# Patient Record
Sex: Female | Born: 1945 | Race: Black or African American | Hispanic: No | Smoking: Never smoker
Health system: Southern US, Community
[De-identification: ages and names within clinical notes are randomized; demographics above are authoritative.]

## PROBLEM LIST (undated history)

## (undated) DIAGNOSIS — Z972 Presence of dental prosthetic device (complete) (partial): Secondary | ICD-10-CM

## (undated) DIAGNOSIS — S42309A Unspecified fracture of shaft of humerus, unspecified arm, initial encounter for closed fracture: Secondary | ICD-10-CM

## (undated) DIAGNOSIS — E785 Hyperlipidemia, unspecified: Secondary | ICD-10-CM

## (undated) DIAGNOSIS — E039 Hypothyroidism, unspecified: Secondary | ICD-10-CM

## (undated) DIAGNOSIS — R42 Dizziness and giddiness: Secondary | ICD-10-CM

## (undated) DIAGNOSIS — I10 Essential (primary) hypertension: Secondary | ICD-10-CM

## (undated) DIAGNOSIS — E119 Type 2 diabetes mellitus without complications: Secondary | ICD-10-CM

## (undated) DIAGNOSIS — I509 Heart failure, unspecified: Secondary | ICD-10-CM

## (undated) HISTORY — PX: FOOT SURGERY: SHX648

---
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
## 2004-07-06 ENCOUNTER — Ambulatory Visit: Payer: Self-pay | Admitting: Family Medicine

## 2005-01-07 ENCOUNTER — Ambulatory Visit: Payer: Self-pay | Admitting: Family Medicine

## 2005-02-06 ENCOUNTER — Ambulatory Visit: Payer: Self-pay | Admitting: Family Medicine

## 2005-09-10 ENCOUNTER — Ambulatory Visit: Payer: Self-pay

## 2006-01-13 ENCOUNTER — Other Ambulatory Visit: Payer: Self-pay

## 2006-01-13 ENCOUNTER — Emergency Department: Payer: Self-pay | Admitting: Emergency Medicine

## 2006-06-26 ENCOUNTER — Ambulatory Visit: Payer: Self-pay | Admitting: Family Medicine

## 2006-07-08 ENCOUNTER — Ambulatory Visit: Payer: Self-pay | Admitting: Family Medicine

## 2006-08-07 ENCOUNTER — Ambulatory Visit: Payer: Self-pay | Admitting: Family Medicine

## 2006-09-17 ENCOUNTER — Ambulatory Visit: Payer: Self-pay

## 2006-11-28 ENCOUNTER — Ambulatory Visit: Payer: Self-pay | Admitting: Family Medicine

## 2007-01-14 ENCOUNTER — Ambulatory Visit: Payer: Self-pay | Admitting: Gastroenterology

## 2007-04-13 ENCOUNTER — Ambulatory Visit: Payer: Self-pay | Admitting: Nephrology

## 2007-08-25 ENCOUNTER — Ambulatory Visit: Payer: Self-pay | Admitting: Family Medicine

## 2007-12-08 ENCOUNTER — Ambulatory Visit: Payer: Self-pay

## 2008-04-24 ENCOUNTER — Emergency Department: Payer: Self-pay | Admitting: Emergency Medicine

## 2008-12-08 ENCOUNTER — Ambulatory Visit: Payer: Self-pay | Admitting: Family Medicine

## 2009-02-23 ENCOUNTER — Ambulatory Visit: Payer: Self-pay | Admitting: Family Medicine

## 2009-06-24 ENCOUNTER — Emergency Department: Payer: Self-pay | Admitting: Emergency Medicine

## 2009-10-28 ENCOUNTER — Emergency Department: Payer: Self-pay | Admitting: Emergency Medicine

## 2009-12-27 ENCOUNTER — Ambulatory Visit: Payer: Self-pay | Admitting: Family Medicine

## 2010-12-08 ENCOUNTER — Emergency Department: Payer: Self-pay | Admitting: Emergency Medicine

## 2011-02-20 ENCOUNTER — Ambulatory Visit: Payer: Self-pay | Admitting: Family Medicine

## 2011-04-13 ENCOUNTER — Emergency Department: Payer: Self-pay | Admitting: Emergency Medicine

## 2011-04-13 LAB — COMPREHENSIVE METABOLIC PANEL
Albumin: 3.9 g/dL (ref 3.4–5.0)
Alkaline Phosphatase: 117 U/L (ref 50–136)
Anion Gap: 12 (ref 7–16)
BUN: 31 mg/dL — ABNORMAL HIGH (ref 7–18)
Bilirubin,Total: 0.4 mg/dL (ref 0.2–1.0)
Calcium, Total: 9.8 mg/dL (ref 8.5–10.1)
Chloride: 101 mmol/L (ref 98–107)
Co2: 24 mmol/L (ref 21–32)
Creatinine: 1.9 mg/dL — ABNORMAL HIGH (ref 0.60–1.30)
EGFR (African American): 34 — ABNORMAL LOW
EGFR (Non-African Amer.): 28 — ABNORMAL LOW
Glucose: 393 mg/dL — ABNORMAL HIGH (ref 65–99)
Osmolality: 297 (ref 275–301)
Potassium: 4.4 mmol/L (ref 3.5–5.1)
SGOT(AST): 18 U/L (ref 15–37)
SGPT (ALT): 25 U/L
Sodium: 137 mmol/L (ref 136–145)
Total Protein: 8.8 g/dL — ABNORMAL HIGH (ref 6.4–8.2)

## 2011-04-13 LAB — CBC
HCT: 35.8 % (ref 35.0–47.0)
HGB: 11.9 g/dL — ABNORMAL LOW (ref 12.0–16.0)
MCH: 30.2 pg (ref 26.0–34.0)
MCHC: 33.2 g/dL (ref 32.0–36.0)
MCV: 91 fL (ref 80–100)
Platelet: 228 10*3/uL (ref 150–440)
RBC: 3.94 10*6/uL (ref 3.80–5.20)
RDW: 13.6 % (ref 11.5–14.5)
WBC: 8.8 10*3/uL (ref 3.6–11.0)

## 2011-04-13 LAB — CK TOTAL AND CKMB (NOT AT ARMC)
CK, Total: 73 U/L (ref 21–215)
CK-MB: 0.7 ng/mL (ref 0.5–3.6)

## 2011-04-13 LAB — TROPONIN I: Troponin-I: <0.02 ng/mL

## 2011-10-13 ENCOUNTER — Emergency Department: Payer: Self-pay | Admitting: Emergency Medicine

## 2012-02-25 ENCOUNTER — Ambulatory Visit: Payer: Self-pay | Admitting: Family Medicine

## 2012-05-13 ENCOUNTER — Emergency Department: Payer: Self-pay | Admitting: Internal Medicine

## 2012-05-13 LAB — URINALYSIS, COMPLETE
Bilirubin,UR: NEGATIVE
Blood: NEGATIVE
Glucose,UR: 50 mg/dL (ref 0–75)
Ketone: NEGATIVE
Nitrite: NEGATIVE
Ph: 5 (ref 4.5–8.0)
Protein: 100
RBC,UR: 1 /HPF (ref 0–5)
Specific Gravity: 1.013 (ref 1.003–1.030)
Squamous Epithelial: 2
WBC UR: 11 /HPF (ref 0–5)

## 2012-05-13 LAB — COMPREHENSIVE METABOLIC PANEL
Albumin: 3.5 g/dL (ref 3.4–5.0)
Alkaline Phosphatase: 122 U/L (ref 50–136)
Anion Gap: 8 (ref 7–16)
BUN: 19 mg/dL — ABNORMAL HIGH (ref 7–18)
Bilirubin,Total: 0.3 mg/dL (ref 0.2–1.0)
Calcium, Total: 8.9 mg/dL (ref 8.5–10.1)
Chloride: 107 mmol/L (ref 98–107)
Co2: 23 mmol/L (ref 21–32)
Creatinine: 1.4 mg/dL — ABNORMAL HIGH (ref 0.60–1.30)
EGFR (African American): 46 — ABNORMAL LOW
EGFR (Non-African Amer.): 39 — ABNORMAL LOW
Glucose: 196 mg/dL — ABNORMAL HIGH (ref 65–99)
Osmolality: 283 (ref 275–301)
Potassium: 4 mmol/L (ref 3.5–5.1)
SGOT(AST): 15 U/L (ref 15–37)
SGPT (ALT): 17 U/L (ref 12–78)
Sodium: 138 mmol/L (ref 136–145)
Total Protein: 8.2 g/dL (ref 6.4–8.2)

## 2012-05-13 LAB — CBC
HCT: 32.8 % — ABNORMAL LOW (ref 35.0–47.0)
HGB: 10.4 g/dL — ABNORMAL LOW (ref 12.0–16.0)
MCH: 28.7 pg (ref 26.0–34.0)
MCHC: 31.7 g/dL — ABNORMAL LOW (ref 32.0–36.0)
MCV: 90 fL (ref 80–100)
Platelet: 215 10*3/uL (ref 150–440)
RBC: 3.62 10*6/uL — ABNORMAL LOW (ref 3.80–5.20)
RDW: 13.6 % (ref 11.5–14.5)
WBC: 10.5 10*3/uL (ref 3.6–11.0)

## 2012-05-13 LAB — CK TOTAL AND CKMB (NOT AT ARMC)
CK, Total: 86 U/L (ref 21–215)
CK-MB: 0.9 ng/mL (ref 0.5–3.6)

## 2012-05-13 LAB — TROPONIN I: Troponin-I: <0.02 ng/mL

## 2013-03-23 ENCOUNTER — Ambulatory Visit: Payer: Self-pay | Admitting: Family Medicine

## 2013-04-20 ENCOUNTER — Inpatient Hospital Stay: Payer: Self-pay | Admitting: Internal Medicine

## 2013-04-20 LAB — BASIC METABOLIC PANEL
Anion Gap: 3 — ABNORMAL LOW (ref 7–16)
BUN: 25 mg/dL — ABNORMAL HIGH (ref 7–18)
Calcium, Total: 9.4 mg/dL (ref 8.5–10.1)
Chloride: 107 mmol/L (ref 98–107)
Co2: 24 mmol/L (ref 21–32)
Creatinine: 1.36 mg/dL — ABNORMAL HIGH (ref 0.60–1.30)
EGFR (African American): 47 — ABNORMAL LOW
EGFR (Non-African Amer.): 40 — ABNORMAL LOW
Glucose: 168 mg/dL — ABNORMAL HIGH (ref 65–99)
Osmolality: 277 (ref 275–301)
Potassium: 5.4 mmol/L — ABNORMAL HIGH (ref 3.5–5.1)
Sodium: 134 mmol/L — ABNORMAL LOW (ref 136–145)

## 2013-04-20 LAB — CBC
HCT: 34.6 % — ABNORMAL LOW (ref 35.0–47.0)
HGB: 11.7 g/dL — ABNORMAL LOW (ref 12.0–16.0)
MCH: 30.2 pg (ref 26.0–34.0)
MCHC: 33.7 g/dL (ref 32.0–36.0)
MCV: 89 fL (ref 80–100)
Platelet: 252 10*3/uL (ref 150–440)
RBC: 3.87 10*6/uL (ref 3.80–5.20)
RDW: 13.7 % (ref 11.5–14.5)
WBC: 10.5 10*3/uL (ref 3.6–11.0)

## 2013-04-20 LAB — TROPONIN I
Troponin-I: <0.02 ng/mL
Troponin-I: <0.02 ng/mL

## 2013-04-20 LAB — CK-MB: CK-MB: 1.4 ng/mL (ref 0.5–3.6)

## 2013-04-20 LAB — TSH: Thyroid Stimulating Horm: 1.01 u[IU]/mL

## 2013-04-20 LAB — LIPASE, BLOOD: Lipase: 1302 U/L — ABNORMAL HIGH (ref 73–393)

## 2013-04-20 LAB — HEMOGLOBIN A1C: Hemoglobin A1C: 8 % — ABNORMAL HIGH (ref 4.2–6.3)

## 2013-04-21 LAB — CBC WITH DIFFERENTIAL/PLATELET
Basophil #: 0.1 10*3/uL (ref 0.0–0.1)
Basophil %: 0.6 %
Eosinophil #: 0.1 10*3/uL (ref 0.0–0.7)
Eosinophil %: 0.6 %
HCT: 31.5 % — ABNORMAL LOW (ref 35.0–47.0)
HGB: 10.6 g/dL — ABNORMAL LOW (ref 12.0–16.0)
Lymphocyte #: 2.4 10*3/uL (ref 1.0–3.6)
Lymphocyte %: 25.4 %
MCH: 29.8 pg (ref 26.0–34.0)
MCHC: 33.6 g/dL (ref 32.0–36.0)
MCV: 89 fL (ref 80–100)
Monocyte #: 0.8 x10 3/mm (ref 0.2–0.9)
Monocyte %: 8.6 %
Neutrophil #: 6.2 10*3/uL (ref 1.4–6.5)
Neutrophil %: 64.8 %
Platelet: 210 10*3/uL (ref 150–440)
RBC: 3.56 10*6/uL — ABNORMAL LOW (ref 3.80–5.20)
RDW: 13.4 % (ref 11.5–14.5)
WBC: 9.5 10*3/uL (ref 3.6–11.0)

## 2013-04-21 LAB — URINALYSIS, COMPLETE
Bacteria: NONE SEEN
Bilirubin,UR: NEGATIVE
Blood: NEGATIVE
Glucose,UR: 50 mg/dL (ref 0–75)
Ketone: NEGATIVE
Leukocyte Esterase: NEGATIVE
Nitrite: NEGATIVE
Ph: 5 (ref 4.5–8.0)
Protein: 100
RBC,UR: NONE SEEN /HPF (ref 0–5)
Specific Gravity: 1.015 (ref 1.003–1.030)
Squamous Epithelial: 1
WBC UR: 3 /HPF (ref 0–5)

## 2013-04-21 LAB — COMPREHENSIVE METABOLIC PANEL
Albumin: 3.2 g/dL — ABNORMAL LOW (ref 3.4–5.0)
Alkaline Phosphatase: 95 U/L
Anion Gap: 10 (ref 7–16)
BUN: 28 mg/dL — ABNORMAL HIGH (ref 7–18)
Bilirubin,Total: 0.2 mg/dL (ref 0.2–1.0)
Calcium, Total: 8.8 mg/dL (ref 8.5–10.1)
Chloride: 109 mmol/L — ABNORMAL HIGH (ref 98–107)
Co2: 21 mmol/L (ref 21–32)
Creatinine: 1.7 mg/dL — ABNORMAL HIGH (ref 0.60–1.30)
EGFR (African American): 36 — ABNORMAL LOW
EGFR (Non-African Amer.): 31 — ABNORMAL LOW
Glucose: 264 mg/dL — ABNORMAL HIGH (ref 65–99)
Osmolality: 294 (ref 275–301)
Potassium: 4 mmol/L (ref 3.5–5.1)
SGOT(AST): 11 U/L — ABNORMAL LOW (ref 15–37)
SGPT (ALT): 11 U/L — ABNORMAL LOW (ref 12–78)
Sodium: 140 mmol/L (ref 136–145)
Total Protein: 7.5 g/dL (ref 6.4–8.2)

## 2013-04-21 LAB — LIPID PANEL
Cholesterol: 174 mg/dL (ref 0–200)
HDL Cholesterol: 34 mg/dL — ABNORMAL LOW (ref 40–60)
Ldl Cholesterol, Calc: 95 mg/dL (ref 0–100)
Triglycerides: 224 mg/dL — ABNORMAL HIGH (ref 0–200)
VLDL Cholesterol, Calc: 45 mg/dL — ABNORMAL HIGH (ref 5–40)

## 2013-04-21 LAB — CK-MB: CK-MB: 1 ng/mL (ref 0.5–3.6)

## 2013-04-21 LAB — LIPASE, BLOOD: Lipase: 4420 U/L — ABNORMAL HIGH (ref 73–393)

## 2013-04-21 LAB — TROPONIN I: Troponin-I: <0.02 ng/mL

## 2013-04-22 LAB — BASIC METABOLIC PANEL
Anion Gap: 4 — ABNORMAL LOW (ref 7–16)
BUN: 22 mg/dL — ABNORMAL HIGH (ref 7–18)
Calcium, Total: 8.8 mg/dL (ref 8.5–10.1)
Chloride: 105 mmol/L (ref 98–107)
Co2: 25 mmol/L (ref 21–32)
Creatinine: 1.48 mg/dL — ABNORMAL HIGH (ref 0.60–1.30)
EGFR (African American): 42 — ABNORMAL LOW
EGFR (Non-African Amer.): 37 — ABNORMAL LOW
Glucose: 149 mg/dL — ABNORMAL HIGH (ref 65–99)
Osmolality: 274 (ref 275–301)
Potassium: 3.9 mmol/L (ref 3.5–5.1)
Sodium: 134 mmol/L — ABNORMAL LOW (ref 136–145)

## 2013-04-22 LAB — LIPASE, BLOOD: Lipase: 435 U/L — ABNORMAL HIGH (ref 73–393)

## 2013-04-23 LAB — URINE CULTURE

## 2013-05-11 ENCOUNTER — Ambulatory Visit: Payer: Self-pay | Admitting: Ophthalmology

## 2013-05-11 LAB — POTASSIUM: Potassium: 4.3 mmol/L (ref 3.5–5.1)

## 2013-05-20 DIAGNOSIS — H269 Unspecified cataract: Secondary | ICD-10-CM | POA: Insufficient documentation

## 2013-05-20 DIAGNOSIS — E669 Obesity, unspecified: Secondary | ICD-10-CM | POA: Insufficient documentation

## 2013-05-20 DIAGNOSIS — E11610 Type 2 diabetes mellitus with diabetic neuropathic arthropathy: Secondary | ICD-10-CM | POA: Insufficient documentation

## 2013-06-14 ENCOUNTER — Ambulatory Visit: Payer: Self-pay | Admitting: Ophthalmology

## 2013-06-14 LAB — POTASSIUM: Potassium: 5.3 mmol/L — ABNORMAL HIGH (ref 3.5–5.1)

## 2013-06-22 ENCOUNTER — Ambulatory Visit: Payer: Self-pay | Admitting: Ophthalmology

## 2014-04-06 ENCOUNTER — Ambulatory Visit: Payer: Self-pay | Admitting: Family Medicine

## 2014-04-17 ENCOUNTER — Emergency Department: Payer: Self-pay | Admitting: Emergency Medicine

## 2014-04-17 LAB — COMPREHENSIVE METABOLIC PANEL
Albumin: 3.4 g/dL (ref 3.4–5.0)
Alkaline Phosphatase: 103 U/L
Anion Gap: 9 (ref 7–16)
BUN: 22 mg/dL — ABNORMAL HIGH (ref 7–18)
Bilirubin,Total: 0.3 mg/dL (ref 0.2–1.0)
Calcium, Total: 8.8 mg/dL (ref 8.5–10.1)
Chloride: 109 mmol/L — ABNORMAL HIGH (ref 98–107)
Co2: 24 mmol/L (ref 21–32)
Creatinine: 1.78 mg/dL — ABNORMAL HIGH (ref 0.60–1.30)
EGFR (African American): 37 — ABNORMAL LOW
EGFR (Non-African Amer.): 30 — ABNORMAL LOW
Glucose: 89 mg/dL (ref 65–99)
Osmolality: 286 (ref 275–301)
Potassium: 4 mmol/L (ref 3.5–5.1)
SGOT(AST): 18 U/L (ref 15–37)
SGPT (ALT): 13 U/L — ABNORMAL LOW
Sodium: 142 mmol/L (ref 136–145)
Total Protein: 8.2 g/dL (ref 6.4–8.2)

## 2014-04-17 LAB — CBC
HCT: 32.9 % — ABNORMAL LOW (ref 35.0–47.0)
HGB: 10.7 g/dL — ABNORMAL LOW (ref 12.0–16.0)
MCH: 29.5 pg (ref 26.0–34.0)
MCHC: 32.5 g/dL (ref 32.0–36.0)
MCV: 91 fL (ref 80–100)
Platelet: 226 10*3/uL (ref 150–440)
RBC: 3.63 10*6/uL — ABNORMAL LOW (ref 3.80–5.20)
RDW: 13.8 % (ref 11.5–14.5)
WBC: 9.6 10*3/uL (ref 3.6–11.0)

## 2014-04-17 LAB — LIPASE, BLOOD: Lipase: 150 U/L (ref 73–393)

## 2014-05-16 DIAGNOSIS — R809 Proteinuria, unspecified: Secondary | ICD-10-CM | POA: Insufficient documentation

## 2014-07-30 NOTE — Consult Note (Signed)
PATIENT NAME:  [name] [name], [name] [name] MR#:  [id number] DATE OF BIRTH:  31-Oct-1945  DATE OF CONSULTATION:  04/20/2013  CONSULTING PHYSICIAN:  Theodore Demark, NP and Tana Conch, MD  GI consult was ordered by Dr. Vianne Bulls to evaluate left upper quadrant pain and elevated lipase. The patient was admitted 04/20/2013.   HISTORY OF PRESENT ILLNESS: I appreciate consult for this 69 year old pleasant African American woman with history of diabetes, hypertension, hyperlipidemia, stage III kidney disease for evaluation of left upper quadrant pain/elevated lipase. States a nagging left upper quadrant sensation over the last several weeks with sudden exacerbation of severe left upper quadrant/flank pain, increased by movement yesterday. She was found to have elevated lipase. Had CT noncontrasted that demonstrated no obstructive or inflammatory abnormalities. She did have some fecal retention and nonobstructing renal stones. Triglycerides with minimal elevation. Denies alcohol use. Does take Victoza for her diabetes. Lipase increased today. She has been on minimal fluid resuscitation. States her pain however, is almost entirely gone and feels much better. Really has no complaints presently other than she would like something to eat. She is currently n.p.o. awaiting M.R.C.P. ordered by Dr. Vianne Bulls.   PAST MEDICAL HISTORY: Vertigo, osteoporosis, CKD 3, diabetes, hypertension, hyperlipidemia.    MEDICATIONS: Amlodipine 10 mg p.o. daily, atorvastatin 40 mg p.o. daily, carvedilol 25 mg b.i.d., HCTZ 25 mg p.o. daily, Lantus 8 units at bedtime, quinapril 40 mg p.o. b.i.d., Victoza subcutaneous once daily.   PAST SURGICAL HISTORY: None.   ALLERGIES: None.   FAMILY HISTORY: Significant for brother deceased due to pancreatic cancer. No other family GI history.   SOCIAL HISTORY: Divorced, son lives with her. No smoking, alcohol use or illicits. She works as a Quarry manager.   REVIEW OF SYSTEMS: Ten systems reviewed, negative  other than what is mentioned above. She states she is due for her colonoscopy in the next 1 or 2 years.   LABORATORY DATA: Most recent labs: Glucose 264, BUN 28, creatinine 1.7, sodium 40, potassium 4, calcium 8.8, chloride 109, GFR 36, triglycerides 224, total cholesterol 174, HDL 34, LDL 95. A1c 8, lipase 4420. Cardiac enzymes negative x 3. Total protein 7.5, albumin 3.2,  total bilirubin 0.2, ALP 95, ast/alt 11. TSH 1.01. WBC 9.5, hemoglobin 10.6, hematocrit 31.5, platelet 210, red cells are normocytic. CT revealing nonobstructing bilateral medullary calculi and some atherosclerosis. Some fecal retention, however, in review with Dr. Gustavo Lah, on CT the gallbladder appears questionably enlarged.   PHYSICAL EXAMINATION: VITAL SIGNS: Most recent vital signs: Temperature 98.6, pulse 87, respiratory rate 18, blood pressure 148/79.  GENERAL: A pleasant woman sitting up in bed in no acute distress.  HEENT: Normocephalic, atraumatic. Sclerae are clear conjunctivae pink.  NECK: Supple. No thyromegaly, lymphadenopathy.  CHEST: Respirations eupneic. Lungs clear bilaterally.  ABDOMEN: Protuberant abdomen, soft. Bowel sounds x 4. Nontender, nondistended. No guarding, rebound, tenderness, or other peritoneal signs, hepatosplenomegaly or other abnormalities.  SKIN: Warm, dry, pink. No erythema, lesion or rash.  EXTREMITIES: MAEW x 4. Strength 5/5.  NEUROLOGIC:  Speech clear. Cranial nerves II through XII intact.  Alert and oriented x3.  PSYCH: Pleasant, calm, cooperative.   IMPRESSION AND PLAN: Suspect acute pancreatitis. Pain control and hydration are mainstays of   therapy. Thus we will increase her IV fluids to 125 mL/hour. We will need to watch for signs of volume overload. She is currently feeling better. Will likely need to find alternative agent for diabetes other than Victoza due to her pancreatitis, as UTD states that this and other  Glucagon-like peptide-1 agonist should not be used in patients who have  had pancreatitis. Await M.R.C.P. reports.  Did discuss with Dr. Gustavo Lah and radiologist findings regarding her gallbladder on CT. We will add an abdominal ultrasound to further investigate this.   Thank you very much for this consult.   These services were provided by Stephens November, MSN, [facility], in collaboration with Loistine Simas, M.D. with whom I have discussed this patient in full.   ____________________________ Theodore Demark, NP chl:dp D: 04/21/2013 15:49:00 ET T: 04/21/2013 16:04:58 ET JOB#: [id number]  cc: Theodore Demark, NP, <Dictator> [place] SIGNED 04/28/2013 17:07

## 2014-07-30 NOTE — Consult Note (Signed)
   Present Illness The patient is a 69 year old African American female with history of CKD, diabetes mellitus, hypertension, hyperlipidemia, who presents to the hospital with left-sided pains. She states the pains are on her left flank. They are worsened when she takes a deep breath or moves form side to side. She denies chest pian. She has ruled out for an mi. EKG is unremarkable. She had a pleuritic component to her symtpoms . V/Q scan was low prob. Her lipase is elevated c/w possible pancreatitis.   Physical Exam:  GEN obese   HEENT PERRL, hearing intact to voice   NECK No masses   RESP no use of accessory muscles   CARD Regular rate and rhythm  Normal, S1, S2   ABD positive tenderness  positive Flank Tenderness   EXTR negative edema   SKIN normal to palpation   NEURO cranial nerves intact, motor/sensory function intact   PSYCH A+O to time, place, person   Review of Systems:  Subjective/Chief Complaint left flank and abdomoinal pain   General: Fatigue   Skin: No Complaints   ENT: No Complaints   Eyes: No Complaints   Neck: No Complaints   Respiratory: No Complaints   Cardiovascular: No Complaints   Gastrointestinal: left abdominal and flank pain   Genitourinary: No Complaints   Vascular: No Complaints   Musculoskeletal: No Complaints   Neurologic: No Complaints   Hematologic: No Complaints   Endocrine: No Complaints   Psychiatric: No Complaints   Review of Systems: All other systems were reviewed and found to be negative   Medications/Allergies Reviewed Medications/Allergies reviewed   EKG:  EKG NSR    No Known Allergies:    Impression 69 yo female with abdominal and flank pain. Ruled out for mi. EKG unremarkable for ischmeia. Pain is pleuritic and positions. Markedly elevated serum lipase and elevated lfts. Does not appear ischemic in nature. V/Q scan neg. Agree with workup of possible etiologies of pancreatitis.   Plan 1. Continue iwth  current meds and working up etiology of flank and abdominal pain. 2Does not appear to require further invasive  or nonivanseive cardiac workup at present.   Electronic Signatures: Teodoro Spray (MD)  (Signed 14-Jan-15 17:04)  Authored: General Aspect/Present Illness, History and Physical Exam, Review of System, EKG , Allergies, Impression/Plan   Last Updated: 14-Jan-15 17:04 by Teodoro Spray (MD)

## 2014-07-30 NOTE — H&P (Signed)
PATIENT NAME:  [name] [name], [name] [name] MR#:  [id number] DATE OF BIRTH:  [birth year]  DATE OF ADMISSION:  04/20/2013  PRIMARY CARE PHYSICIAN: Delight Stare, MD  HISTORY OF PRESENT ILLNESS: The patient is a 69 year old African American female with history of CKD, diabetes mellitus, hypertension, hyperlipidemia, who presents to the hospital with left-sided pains. According to the patient, she was doing well up until a few months ago when she started having on-and-off intermittent pain in the left flank area. The pain is described as intermittent, sharp, increasing whenever she takes deep breaths or whenever she lies down on the left side.   Today in the morning when this pain got restarted, she had difficulty walking or taking deep breaths. It also was increasing with walking or moving around. It is accompanied by some shortness of breath. She denied any lightheadedness or dizziness or feeling presyncopal; however, she denied also any trauma or lower extremity swelling. She stated that her pain is sometimes associated with back pains, however, denies any rash or recent injury.   On arrival to the hospital, the patient was thought to have some possible rib injury. She had an x-ray of her ribs; however, that did not show any significant changes, and the hospitalist services were contacted for possible admission of patient with left-sided chest pains. The patient denies, however, any left pain in the chest, and she tells me that the pain is in left upper quadrant as well as left flank area but not in the chest, not the rib area.   PAST MEDICAL HISTORY: Significant for history of vertigo, osteoporosis, CKD with creatinine level of 1.3, diabetes mellitus, hypertension, hyperlipidemia.   MEDICATIONS: According to medical records, the patient is on amlodipine 10 mg p.o. daily, atorvastatin 40 mg p.o. daily, carvedilol 25 mg p.o. twice daily, hydrochlorothiazide 25 mg p.o. daily, Lantus 8 units subcutaneously at bedtime,  quinapril 40 mg p.o. twice daily, Victoza 18 mg subcutaneously once daily.   PAST SURGICAL HISTORY: None.  ALLERGIES: None.   FAMILY HISTORY: Diabetes mellitus in both of the patient's parents. The patient's brother had MI at age of 74. The patient's other brother died of stomach cancer at the age of 77.   SOCIAL HISTORY: The patient is divorced, has 1 son who lives close by. No smoking, alcohol abuse. She works as a Psychologist, counselling.   REVIEW OF SYSTEMS:  CONSTITUTIONAL: Positive for pains in the abdomen, left upper quadrant, left flank area, some blurring of vision, some shortness of breath intermittently, and constipation. She denies any fever chills, fatigue, weakness, weight loss or gain.  EYES: No double vision, glaucoma or cataracts.  ENT: No tinnitus, allergies, epistaxis, sinus pain, dentures or difficulty swallowing. RESPIRATORY: Denies any cough, hemoptysis, asthma or COPD.  CARDIOVASCULAR: Denies any orthopnea, edema, arrhythmias, palpitations, or syncope. GASTROINTESTINAL: Denies nausea, vomiting, diarrhea. Admits to constipation.  GENITOURINARY: Denies dysuria, hematuria, frequency, incontinence.  ENDOCRINE: Denies any polydipsia, nocturia, thyroid problems, heat or cold intolerance or thirst.  HEMATOLOGIC: Denies anemia, easy bruising or swollen glands.  SKIN: Denies any acne, rashes, change in moles.  MUSCULOSKELETAL: Denies arthritis, cramps, swelling, gout.  NEUROLOGIC: No numbness, epilepsy or tremors.  PSYCHIATRIC: Denies anxiety or depression.   PHYSICAL EXAMINATION:  VITAL SIGNS: On arrival to the hospital, temperature is 97.2, pulse was 91, respirations 20, blood pressure 162/98, saturation was 97% on room air.  GENERAL: A well-developed, well-nourished, obese African American female in no significant distress, comfortable on stretcher. She is, however, in pain whenever she tries  to move around.  HEENT: Her pupils are equal, reactive to light. Extraocular muscles  are intact. No icterus or conjunctivitis. Has normal hearing. No pharyngeal erythema. Mucosa is moist. NECK: No masses. Supple, nontender. Thyroid is not enlarged. No adenopathy. No JVD or carotid bruits bilaterally. Full range of motion.  LUNGS: Clear to auscultation in all fields. No rales or rhonchi, diminished breath sounds or wheezing. No labored inspirations, increased effort, dullness to percussion, or respiratory distress. The patient does have CVA tenderness and left side.  CARDIOVASCULAR: S1, S2 appreciated. No murmurs, gallops, or rubs were noted. PMI not lateralized. Chest is nontender to palpation, 1+ pedal pulses.  EXTREMITIES: No lower extremity edema, calf tenderness or cyanosis were noted.  ABDOMEN: Soft, tender in left upper quadrant as well as epigastric area but no rebound or guarding were noted. No hepatosplenomegaly or masses were noted.  RECTAL: Deferred.  MUSCLE STRENGTH: Able to move all extremities. No cyanosis, degenerative joint disease or kyphosis. Gait is not tested.  SKIN: Did not reveal any rashes, lesions, erythema, nodularity or induration. It was warm and dry to palpation.  LYMPHATIC: No adenopathy in the cervical region.  NEUROLOGICAL: Cranial nerves grossly intact. Sensory is intact. No dysarthria or aphasia. The patient is alert and oriented to person and place. Cooperative. Memory is good. No significant confusion, agitation or depression noted.   EKG showed normal sinus rhythm with sinus arrhythmia at 93 beats per minute, normal axis, nonspecific T-wave abnormality in lateral leads, and no acute ST-T changes were noted.   LABORATORY DATA: BMP showed glucose 168, BUN and creatinine were 25 and 1.36. Sodium 134, potassium 5.4; otherwise, BMP was unremarkable. The patient's troponin level was less than 0.02. CBC: White blood cell count of 10.5, hemoglobin 11.7, platelet count 252. Urinalysis was not done.   RADIOLOGIC STUDIES: Chest x-ray, PA and lateral, 13th of  January 2015, revealed no acute abnormality.   ASSESSMENT AND PLAN:  1.  Left upper quadrant abdominal pain of unclear etiology at this time. Get lipase level. Get CT scan of abdomen and pelvis. Get urinalysis to rule out pyelonephritis or any other pathology in the abdomen.  2.  Renal insufficiency. IV fluids. Get urinalysis and get cultures.  3.  Hyperkalemia. Given Kayexalate already in the Emergency Room. We will also check potassium level in the morning. We will continue IV fluids at a low rate.  4.  Malignant hypertension. We will continue outpatient medications except of ACE inhibitor. We will also give labetalol IV as needed for blood pressure above 170s.  5.  Abnormal EKG. We will check cardiac enzymes x3. We will continue beta blockers, nitroglycerin. We will be holding ACE inhibitor for now.   TIME SPENT: 50 minutes on this patient.   ____________________________ Theodoro Grist, MD rv:np D: 04/20/2013 20:28:33 ET T: 04/20/2013 21:18:01 ET JOB#: [id number]  cc: Theodoro Grist, MD, <Dictator> Marguerita Merles, MD  [place] MD ELECTRONICALLY SIGNED 04/21/2013 17:02

## 2014-07-30 NOTE — Consult Note (Signed)
Chief Complaint:  Subjective/Chief Complaint seen for pancreatitis.  patient feeling better, no n/v or abd pain, tolerating full liquids.   VITAL SIGNS/ANCILLARY NOTES: **Vital Signs.:   15-Jan-15 04:32  Vital Signs Type Routine  Temperature Temperature (F) 98.3  Temperature Source oral  Pulse Pulse 72  Respirations Respirations 18  Systolic BP Systolic BP 145  Diastolic BP (mmHg) Diastolic BP (mmHg) 71  Mean BP 95  Pulse Ox % Pulse Ox % 94  Pulse Ox Activity Level  At rest  Oxygen Delivery Room Air/ 21 %    10:20  Vital Signs Type Pre Medication  Pulse Pulse 77  Systolic BP Systolic BP 144  Diastolic BP (mmHg) Diastolic BP (mmHg) 72  Mean BP 96   Brief Assessment:  Cardiac Regular   Respiratory clear BS   Gastrointestinal details normal Soft  Nontender  Nondistended  No masses palpable  Bowel sounds normal  protuberant   Lab Results: Hepatic:  14-Jan-15 01:08   Bilirubin, Total 0.2  Alkaline Phosphatase 95 (45-117 NOTE: New Reference Range 02/26/13)  SGPT (ALT)  11  SGOT (AST)  11  Total Protein, Serum 7.5  Albumin, Serum  3.2  Cardiology:  14-Jan-15 17:11   Echo Doppler REASON FOR EXAM:     COMMENTS:     PROCEDURE: ECH - ECHO DOPPLER COMPLETE(TRANSTHOR)  - Apr 21 2013  5:11PM   RESULT: Echocardiogram Report  Patient Name:   Shell FAYE Dulude Date of Exam: 04/21/2013 Medical Rec #:  [id number]            Custom1: Date of Birth:  12/23/1945         Height:       66.0 in Patient Age:    69 years          Weight:       204.0 lb Patient Gender: F                 BSA:          2.02 m??  Indications: SOB Sonographer:    Jerry Hege RDCS Referring Phys: VAICKUTE,RIMA  Summary:  1. Left ventricular ejection fraction, by visual estimation, is 55 to  60%.  2. Normal global left ventricular systolic function.  3. Impaired relaxation pattern of LV diastolic filling.  4. Moderate concentric left ventricularhypertrophy.  5. Moderately increased left ventricular septal  thickness.  6. Mild to moderate tricuspid regurgitation.  7. Moderately increased left ventricular posterior wall thickness. 2D AND M-MODE MEASUREMENTS (normal ranges within parentheses): Left Ventricle:          Normal IVSd (2D):      1.67 cm (0.7-1.1) LVPWd (2D):     1.72 cm (0.7-1.1) Aorta/LA:                  Normal LVIDd (2D):     4.10 cm (3.4-5.7) Aortic Root (2D): 2.50 cm (2.4-3.7) LVIDs (2D):     3.05 cm           LeftAtrium (2D): 3.70 cm (1.9-4.0) LV FS (2D):     25.6 %   (>25%) LV EF (2D):     50.9 %   (>50%)                                   Right Ventricle:                                     RVd (2D):        2.80 cm LV DIASTOLIC FUNCTION: MV Peak E: 0.47 m/s E/e' Ratio: 8.80 MV Peak A: 0.70 m/s Decel Time: 215 msec E/A Ratio: 0.68 SPECTRAL DOPPLER ANALYSIS (where applicable): Mitral Valve: MV P1/2 Time: 62.35 msec MV Area, PHT: 3.53 cm?? Aortic Valve: AoV Max Vel: 1.36 m/s AoV Peak PG: 7.3 mmHg AoV Mean PG: LVOT Vmax: 0.80 m/s LVOT VTI:  LVOT Diameter: 2.10 cm AoV Area, Vmax: 2.03 cm?? AoV Area, VTI:  AoV Area, Vmn: Tricuspid Valve and PA/RV Systolic Pressure: TR Max Velocity: 2.63 m/s RA  Pressure: 5 mmHg RVSP/PASP: 32.6 mmHg Pulmonic Valve: PV Max Velocity: 0.78 m/s PV Max PG: 2.4 mmHg PV Mean PG:  PHYSICIAN INTERPRETATION: Left Ventricle: The left ventricular internal cavity size was normal. LV  septal wall thickness was moderately increased. LV posterior wall  thickness wasmoderately increased. Moderate concentric left ventricular  hypertrophy. Global LV systolic function was normal. Left ventricular  ejection fraction, by visual estimation, is 55 to 60%. Spectral Doppler  shows impaired relaxation pattern of LV diastolic filling. Right Ventricle: The right ventricular size is normal. Left Atrium: The left atrium is normal in size. Mitral Valve: The mitral valve is normal in structure. No evidence of  mitral valve regurgitation is seen. Tricuspid Valve: Mild to  moderate tricuspid regurgitation is visualized.  The tricuspid regurgitant velocity is 2.63 m/s, and with an assumed right  atrial pressure of 5 mmHg, the estimated right ventricular systolic  pressure is normal at 32.6 mmHg. Aortic Valve: The aortic valve is tricuspid. The aortic valve is  structurally normal, with no evidence of sclerosis or stenosis. No  evidence of aortic valve regurgitation is seen.  [id number] Kenneth Fath MD Electronically signed by [id number] Kenneth Fath MD Signature Date/Time: 04/22/2013/7:34:56 AM *** Final ***  IMPRESSION: .    Verified By: KENNETH A. FATH, M.D., MD  Routine Chem:  13-Jan-15 16:26   Lipase  1302 (Result(s) reported on 20 Apr 2013 at 09:18PM.)  14-Jan-15 01:08   Lipase  4420 (Result(s) reported on 21 Apr 2013 at 10:22AM.)  15-Jan-15 09:46   Glucose, Serum  149  BUN  22  Creatinine (comp)  1.48  Sodium, Serum  134  Potassium, Serum 3.9  Chloride, Serum 105  CO2, Serum 25  Calcium (Total), Serum 8.8  Anion Gap  4  Osmolality (calc) 274  eGFR (African American)  42  eGFR (Non-African American)  37 (eGFR values <60mL/min/1.73 m2 may be an indication of chronic kidney disease (CKD). Calculated eGFR is useful in patients with stable renal function. The eGFR calculation will not be reliable in acutely ill patients when serum creatinine is changing rapidly. It is not useful in  patients on dialysis. The eGFR calculation may not be applicable to patients at the low and high extremes of body sizes, pregnant women, and vegetarians.)  Lipase  435 (Result(s) reported on 22 Apr 2013 at 10:13AM.)   Assessment/Plan:  Assessment/Plan:  Assessment 1)  pancreatitis-ude, possible related to Victoza. much improved lipase today.   Plan 1) continue to advance diet as tolerated.  Will need GI fu several weeks after d/c, fu with PMD for diabetic medication adjustment.   Electronic Signatures: Skulskie, Martin (MD)  (Signed 15-Jan-15 11:58)  Authored: Chief  Complaint, VITAL SIGNS/ANCILLARY NOTES, Brief Assessment, Lab Results, Assessment/Plan   Last Updated: 15-Jan-15 11:58 by Skulskie, Martin (MD) 

## 2014-07-30 NOTE — Op Note (Signed)
PATIENT NAME:  [name] [name], [name] [name] MR#:  [id number] DATE OF BIRTH:  19-Nov-1945  DATE OF PROCEDURE:  06/22/2013  PREOPERATIVE DIAGNOSIS: Visually significant cataract of the right eye.   POSTOPERATIVE DIAGNOSIS: Visually significant cataract of the right eye.   OPERATIVE PROCEDURE: Cataract extraction by phacoemulsification with implant of intraocular lens to the right eye.   SURGEON: Birder Robson, MD.   ANESTHESIA:  1. Managed anesthesia care.  2. Topical tetracaine drops followed by 2% Xylocaine jelly applied in the preoperative holding area.   COMPLICATIONS: None.   TECHNIQUE:  Stop and chop.    DESCRIPTION OF PROCEDURE: The patient was examined and consented in the preoperative holding area where the aforementioned topical anesthesia was applied to the right eye and then brought back to the Operating Room where the right eye was prepped and draped in the usual sterile ophthalmic fashion and a lid speculum was placed. A paracentesis was created with the side port blade and the anterior chamber was filled with viscoelastic. A near clear corneal incision was performed with the steel keratome. A continuous curvilinear capsulorrhexis was performed with a cystotome followed by the capsulorrhexis forceps. Hydrodissection and hydrodelineation were carried out with BSS on a blunt cannula. The lens was removed in a stop and chop technique and the remaining cortical material was removed with the irrigation-aspiration handpiece. The capsular bag was inflated with viscoelastic and the Tecnis ZCB00 24.5-diopter lens, serial number [id number] was placed in the capsular bag without complication. The remaining viscoelastic was removed from the eye with the irrigation-aspiration handpiece. The wounds were hydrated. The anterior chamber was flushed with Miostat and the eye was inflated to physiologic pressure. 0.1 mL of cefuroxime concentration 10 mg/mL was placed in the anterior chamber. The wounds were found to  be water tight. The eye was dressed with Vigamox. The patient was given protective glasses to wear throughout the day and a shield with which to sleep tonight. The patient was also given drops with which to begin a drop regimen today and will follow-up with me in one day.   ____________________________ Livingston Diones. Nitya Cauthon, MD wlp:gb D: 06/22/2013 22:47:13 ET T: 06/23/2013 04:57:01 ET JOB#: [id number]  cc: Guilianna Mckoy L. Pasquale Matters, MD, <Dictator> Livingston Diones Daimien Patmon MD ELECTRONICALLY SIGNED 06/24/2013 9:11

## 2014-07-30 NOTE — Discharge Summary (Signed)
PATIENT NAME:  [name] [name], MASSAQUOI MR#:  [id number] DATE OF BIRTH:  [birth year]  DATE OF ADMISSION:  04/20/2013 DATE OF DISCHARGE:  04/22/2013  ADMITTING DIAGNOSIS: Left-sided flank pain.   DISCHARGE DIAGNOSES: 1. Acute pancreatitis likely due to Victoza, suspected Victoza.   2.  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  3.  Left-sided flank pain due to pancreatitis.  4.  Diabetes mellitus.  5.  Chronic kidney disease stage III.  6.  Obesity.  7.  Hyperlipidemia.  8.  Osteoporosis.  9.  Vertigo.   DISCHARGE CONDITION: Stable.   DISCHARGE MEDICATIONS: The patient is to continue:  1.  The patient is to continue Carvedilol 25 mg twice daily.  2.  Quinapril 40 mg p.o. twice daily. This is a new dose.  3.  Lantus 80 units subcutaneously at bedtime.  4.  Atorvastatin 40 mg p.o. at bedtime.  5.  Amlodipine 10 mg p.o. daily.  6.  Pantoprazole 40 mg p.o. daily.   The patient was advised not to take Victoza, ibuprofen or hydrochlorothiazide unless recommended by primary care physician.   HOME OXYGEN: None.   DIET: Two-gram salt, low-fat, low-cholesterol, carbohydrate-controlled diet. The patient was advised a strict low-fat diet, regular consistency.   ACTIVITY LIMITATIONS: As tolerated.   Followup appointment with Dr. Delight Stare in 2 days after discharge, Dr. Gustavo Lah 1 to 2 weeks after discharge.     CONSULTANTS: Dr. Gustavo Lah, Dr. Ubaldo Glassing, Ms. Stephens November, care management, social work.   RADIOLOGIC STUDIES: Chest x-ray, PA and lateral, 04/20/2013, revealed no acute abnormality. CT scan of abdomen and pelvis without contrast, 04/20/2013, showed nonobstructive bilateral medullary calculi. Diffuse atherosclerotic calcifications identified within the renal vessels. No evidence of obstructive or inflammatory abnormalities. Moderate to large amount of fecal retention was noted. M.R.C.P., 04/21/2013, showed no biliary dilatation. No choledocholithiasis. No MR imaging features of pancreatitis. Marrow  enhancement identified in the lower aspect of the mid thoracic vertebral body adjacent to the endplate. This could be related to compression fracture or degenerative change. Metastatic involvement was considered to be less likely, given the lack of cancer history in this individual. V/Q scan, 04/21/2013, revealed a very low probability for acute pulmonary embolus. Ultrasound of abdomen, general survey, 04/21/2013, revealed fatty infiltration of the liver, tiny focus of fatty sparing noted adjacent to the gallbladder. No gallstones or biliary distention was noted.   The patient is a 69 year old African American female with history of diabetes. CKD, hyperlipidemia, who presented to the hospital with complaints of left flank pains. Please refer to Dr. Keenan Bachelor admission on 04/20/2013. On arrival to the hospital, the patient's temperature was normal at 97.2. Pulse was 91. Respiratory rate was 20. Blood pressure 162/94. Saturation was 97% on room air. Physical exam revealed left upper quadrant discomfort, pain on palpation, as well as left CVA tenderness on percussion. The patient's lab studies were done, which showed elevation of BUN and creatinine to 25 and 1.36. Sodium 134, potassium 5.4, glucose 168. Otherwise, BMP was unremarkable. The patient's hemoglobin A1c was checked and was found to be 8.0, and lipase level was 1302. Liver enzymes were unremarkable. Cardiac enzymes x 3 were normal. TSH was normal at 1.01. The patient's white blood cell count was 10.5, hemoglobin was 11.7 and platelet count was 252. D-dimer was slightly elevated at 0.69. Urinalysis was unremarkable. The patient was admitted to the hospital for further evaluation. She was started n.p.o.  She was given IV fluids and consultation with gastroenterologist was obtained. Gastroenterology felt that the patient's  acute pancreatitis could have been related to Victoza, so Victoza was stopped. The patient was continued on pain medications as well as IV  fluid. M.R.C.P. was performed which was unremarkable. The patient also had ultrasound of her right upper quadrant, which was also unremarkable for gallstones or extrahepatic duct dilatation. Dr. Gustavo Lah, who followed the patient along, felt that the patient's acute pancreatitis very likely related to Victoza. He recommended to discontinue Victoza at this time and follow up with primary care physician for diabetic medication adjustment. We checked the patient's lipid panel, and the patient's triglycerides were found to be elevated at 224. HDL was low at 34. The patient's total cholesterol level was 174 and LDL was 95. On the day of discharge, 04/22/2013, the patient's lipase is 435, and the patient's pain had subsided. The patient was  introduced to low-fat, low-cholesterol diet, and if she tolerated this diet well, she is going to be discharged home with the above-mentioned medications and followup. In regards to malignant hypertension, the patient's blood pressure was poorly controlled while she was in the hospital. It was felt to be due to pain issues; however, with advancement of her blood pressure medications, her blood pressure was much better controlled. It was felt that the patient would benefit from continuation of those medications; however, it is recommended to follow the patient's creatinine level as outpatient, since we are advancing her ACE inhibitor. The patient was complaining of some left-sided chest pains and she was evaluated by the cardiologist. An echocardiogram was performed while she was in the hospital on 04/21/2013. It showed left ventricular ejection fraction by visual estimation of 55% to 60%, normal global left ventricular systolic function, impaired relaxation pattern of left ventricular diastolic filling, moderate concentric left ventricular hypertrophy, mildly increased left ventricular septal thickness, as well as mild to moderate tricuspid regurgitation, as well as moderately  increased left ventricular posterior wall thickness. Her cardiologist, Dr. Ubaldo Glassing, did not feel the patient needs to be further evaluated for coronary artery disease at this time, since he felt that the patient's left-sided pain was very likely due to acute pancreatitis. In regards to diabetes mellitus, CKD, hyperlipidemia, the patient is to continue her outpatient medications except her Victoza, as well as hydrochlorothiazide, which could be also implicated in acute pancreatitis. The patient is being discharged in stable condition with the above-mentioned medications and followup. Her vital signs on the day of discharge: Temperature was 97.9. Pulse was 76. Respiration rate was 18. Blood pressure 131/86. Saturation was 97% on room air at rest.   TIME SPENT: 40 minutes on this patient.   ____________________________ Theodoro Grist, MD rv:dmm D: 04/22/2013 15:50:52 ET T: 04/22/2013 19:39:43 ET JOB#: [id number]  cc: Theodoro Grist, MD, <Dictator> Marguerita Merles, MD Lollie Sails, MD Chandler MD ELECTRONICALLY SIGNED 04/30/2013 14:01

## 2014-07-30 NOTE — Consult Note (Signed)
Chief Complaint:  Subjective/Chief Complaint Please see full GI consult.  Patient seen and examined please see full Gi consult and brief consult note.  Patient admitted with luq pain and found with elevated lipase.  Pancreatitis UDE, although possibility of S/E of victoza.  [name] states she is currently feeling better.  Agree with clears.  Multiple imaging studies not informative.  Of note MRCP shows an enhancement of a mid thoracic vertebrae.  Following.   Electronic Signatures: Loistine Simas (MD)  (Signed 14-Jan-15 17:57)  Authored: Chief Complaint   Last Updated: 14-Jan-15 17:57 by Loistine Simas (MD)

## 2014-07-30 NOTE — Consult Note (Signed)
Brief Consult Note: Diagnosis: LUQ pain, elevated lipase.   Patient was seen by consultant.   Consult note dictated.   Comments: Appreciate consult for 69 y/o pleasant African American woman with history of DM/HTN/HL/ stage 3 CKD for evaluation of LUQ pain/elevated lipase. States a nagging luq sensation over the last several weeks, with sudden exacerbation of severe luq/flank pain increased by movement. Was found to have elevated lipase. Had CT that demonstrated no obstrutive of inflammatory/obstructive abnormalities. Did have some fecal retention and non obstructing renal stones. Triglycerides with minimal elevation. Denies etoh use. Does take Victoza for her DM. Lipase increased today. No history of prior panceatitis, although states her brother deceased from pancreatic cancer.  States her pain is almost entirely gone, and feels much better.  Really has no complaints presently, other than she would like something to eat. She is currently NPO awaiting MRCP ordered by Dr Vianne Bulls Impression and plan: Suspect acute pancreatitis. Pain control and hydration are mainstays of therapy. Currently feeling better. Will likely need to find alternative agent for DM other than victoza, due to her pancreatitis, as UTD indicates GLP-1 agonisits should not be used in patients who have had pancreatitis.  Electronic Signatures: Stephens November H (NP)  (Signed 14-Jan-15 12:40)  Authored: Brief Consult Note   Last Updated: 14-Jan-15 12:40 by Theodore Demark (NP)

## 2014-08-17 ENCOUNTER — Emergency Department
Admission: EM | Admit: 2014-08-17 | Discharge: 2014-08-17 | Disposition: A | Discharge status: Home / Self Care | Payer: Medicare Other | Attending: Emergency Medicine | Admitting: Emergency Medicine

## 2014-08-17 ENCOUNTER — Other Ambulatory Visit: Payer: Self-pay

## 2014-08-17 ENCOUNTER — Encounter: Payer: Self-pay | Admitting: Emergency Medicine

## 2014-08-17 DIAGNOSIS — R110 Nausea: Secondary | ICD-10-CM

## 2014-08-17 DIAGNOSIS — E109 Type 1 diabetes mellitus without complications: Secondary | ICD-10-CM | POA: Diagnosis not present

## 2014-08-17 DIAGNOSIS — E139 Other specified diabetes mellitus without complications: Secondary | ICD-10-CM

## 2014-08-17 DIAGNOSIS — R109 Unspecified abdominal pain: Secondary | ICD-10-CM | POA: Diagnosis not present

## 2014-08-17 HISTORY — DX: Type 2 diabetes mellitus without complications: E11.9

## 2014-08-17 LAB — CBC WITH DIFFERENTIAL/PLATELET
Basophils Absolute: 0.1 10*3/uL (ref 0–0.1)
Basophils Relative: 1 %
Eosinophils Absolute: 0.1 10*3/uL (ref 0–0.7)
Eosinophils Relative: 1 %
HCT: 34.4 % — ABNORMAL LOW (ref 35.0–47.0)
Hemoglobin: 11.1 g/dL — ABNORMAL LOW (ref 12.0–16.0)
Lymphocytes Relative: 28 %
Lymphs Abs: 2.2 10*3/uL (ref 1.0–3.6)
MCH: 29 pg (ref 26.0–34.0)
MCHC: 32.1 g/dL (ref 32.0–36.0)
MCV: 90.3 fL (ref 80.0–100.0)
Monocytes Absolute: 0.7 10*3/uL (ref 0.2–0.9)
Monocytes Relative: 8 %
Neutro Abs: 4.8 10*3/uL (ref 1.4–6.5)
Neutrophils Relative %: 62 %
Platelets: 204 10*3/uL (ref 150–440)
RBC: 3.81 MIL/uL (ref 3.80–5.20)
RDW: 13.8 % (ref 11.5–14.5)
WBC: 7.8 10*3/uL (ref 3.6–11.0)

## 2014-08-17 LAB — URINALYSIS COMPLETE WITH MICROSCOPIC (ARMC ONLY)
Bacteria, UA: NONE SEEN
Bilirubin Urine: NEGATIVE
Glucose, UA: NEGATIVE mg/dL
Hgb urine dipstick: NEGATIVE
Ketones, ur: NEGATIVE mg/dL
Nitrite: NEGATIVE
Protein, ur: 100 mg/dL — AB
Specific Gravity, Urine: 1.015 (ref 1.005–1.030)
pH: 6 (ref 5.0–8.0)

## 2014-08-17 LAB — LIPASE, BLOOD: Lipase: 87 U/L — ABNORMAL HIGH (ref 22–51)

## 2014-08-17 LAB — COMPREHENSIVE METABOLIC PANEL
ALT: 10 U/L — ABNORMAL LOW (ref 14–54)
AST: 20 U/L (ref 15–41)
Albumin: 3.9 g/dL (ref 3.5–5.0)
Alkaline Phosphatase: 97 U/L (ref 38–126)
Anion gap: 8 (ref 5–15)
BUN: 28 mg/dL — ABNORMAL HIGH (ref 6–20)
CO2: 24 mmol/L (ref 22–32)
Calcium: 9.4 mg/dL (ref 8.9–10.3)
Chloride: 111 mmol/L (ref 101–111)
Creatinine, Ser: 1.61 mg/dL — ABNORMAL HIGH (ref 0.44–1.00)
GFR calc Af Amer: 37 mL/min — ABNORMAL LOW (ref >60)
GFR calc non Af Amer: 32 mL/min — ABNORMAL LOW (ref >60)
Glucose, Bld: 148 mg/dL — ABNORMAL HIGH (ref 65–99)
Potassium: 4.5 mmol/L (ref 3.5–5.1)
Sodium: 143 mmol/L (ref 135–145)
Total Bilirubin: 0.5 mg/dL (ref 0.3–1.2)
Total Protein: 7.9 g/dL (ref 6.5–8.1)

## 2014-08-17 MED ORDER — ONDANSETRON HCL 4 MG PO TABS: 4.0000 mg | ORAL_TABLET | Freq: Every day | ORAL | Status: AC | PRN

## 2014-08-17 MED ORDER — ONDANSETRON HCL 4 MG PO TABS
4.0000 mg | ORAL_TABLET | Freq: Once | ORAL | Status: AC
  Administered 2014-08-17: 4 mg via ORAL

## 2014-08-17 MED ORDER — ONDANSETRON HCL 4 MG PO TABS
ORAL_TABLET | ORAL | Status: AC
  Filled 2014-08-17: qty 1

## 2014-08-17 NOTE — ED Provider Notes (Signed)
[facility] Emergency Department Provider Note  ____________________________________________  Time seen: 1335  I have reviewed the triage vital signs and the nursing notes.   HISTORY  Chief Complaint Nausea     HPI [name] [name] is a 69 y.o. female who reports she has had nausea for the past 3 days. She has mild tightness in her abdomen but no focal pain. She has had no diarrhea. She has not been eating today because of the concern for nausea. She is a diabetic. She did not take her insulin because of the reduced food intake. She denies any diarrhea. Her last bowel movement was yesterday and normal in nature.  Her regular physician is Dr. Jamelle Rushing at [facility].     Past Medical History  Diagnosis Date  . Diabetes mellitus without complication     There are no active problems to display for this patient.   History reviewed. No pertinent past surgical history.  Current Outpatient Rx  Name  Route  Sig  Dispense  Refill  . ondansetron (ZOFRAN) 4 MG tablet   Oral   Take 1 tablet (4 mg total) by mouth daily as needed for nausea or vomiting.   10 tablet   1     Allergies Review of patient's allergies indicates no known allergies.  No family history on file.  Social History History  Substance Use Topics  . Smoking status: Never Smoker   . Smokeless tobacco: Not on file  . Alcohol Use: No    Review of Systems  Constitutional: Negative for fever. ENT: Negative for sore throat. Cardiovascular: Negative for chest pain. Respiratory: Negative for shortness of breath. Gastrointestinal: Positive for nausea, no diarrhea. See history of present illness. Genitourinary: Negative for dysuria. Musculoskeletal: Negative for back pain. Skin: Negative for rash. Neurological: Negative for headaches   10-point ROS otherwise negative.  ____________________________________________   PHYSICAL EXAM:  VITAL SIGNS: ED Triage Vitals  Enc  Vitals Group     BP 08/17/14 1334 158/69 mmHg     Pulse Rate 08/17/14 1335 50     Resp --      Temp --      Temp src --      SpO2 08/17/14 0939 98 %     Weight 08/17/14 0939 220 lb (99.791 kg)     Height 08/17/14 0939 5\' 6"  (1.676 m)     Head Cir --      Peak Flow --      Pain Score 08/17/14 1330 4     Pain Loc --      Pain Edu? --      Excl. in [place]? --     Constitutional: Alert and oriented. Well appearing and in no distress. ENT   Head: Normocephalic and atraumatic.   Nose: No congestion/rhinnorhea.   Mouth/Throat: Mucous membranes are moist. Cardiovascular: Normal rate, regular rhythm. Respiratory: Normal respiratory effort without tachypnea. Breath sounds are clear and equal bilaterally. No wheezes/rales/rhonchi. Gastrointestinal: Soft and nontender. No distention.  Back: There is no CVA tenderness. Musculoskeletal: Nontender with normal range of motion in all extremities.  No noted edema. The patient does have a brace on her right leg due to a chronic condition secondary to her diabetes. Neurologic:  Normal speech and language. No gross focal neurologic deficits are appreciated.  Skin:  Skin is warm, dry. No rash noted. Psychiatric: Mood and affect are normal. Speech and behavior are normal.  ____________________________________________    LABS (pertinent positives/negatives)  Overall  lab tests are unremarkable. White blood cell count of 7.8. Her UA and is slightly elevated at 28. This is only slightly higher than prior. Creatinine of 1.6. Glucose of 148. ____________________________________________   EKG  EKG at 9:49 AM shows sinus rhythm at 69 there is a bit of a dysrhythmia with premature atrial complexes. She has a flat flipped T-wave in V6.  ____________________________________________   _________________________   PROCEDURES  Procedure(s) performed: None  Critical Care performed: No  ____________________________________________   INITIAL  IMPRESSION / ASSESSMENT AND PLAN / ED COURSE  Nausea of unknown etiology. A shunt appears to be doing fairly well in the emergency department and would like to eat. We will give her a Zofran tablet and then allow by mouth. We have a urinalysis that has now been ordered and is pending.  ____________________________________________   FINAL CLINICAL IMPRESSION(S) / ED DIAGNOSES  Final diagnoses:  Nausea  Diabetes 1.5, managed as type 1      Ahmed Prima, MD 08/17/14 [phone]

## 2014-08-17 NOTE — ED Notes (Signed)
Pt reports nausea x 3 days.  Denies pain.  Skin w/d with good color

## 2014-09-04 ENCOUNTER — Emergency Department
Admission: EM | Admit: 2014-09-04 | Discharge: 2014-09-04 | Discharge status: Against Medical Advice | Payer: Medicare Other | Attending: Emergency Medicine | Admitting: Emergency Medicine

## 2014-09-04 ENCOUNTER — Encounter: Payer: Self-pay | Admitting: Emergency Medicine

## 2014-09-04 DIAGNOSIS — E1165 Type 2 diabetes mellitus with hyperglycemia: Secondary | ICD-10-CM | POA: Insufficient documentation

## 2014-09-04 LAB — BASIC METABOLIC PANEL
Anion gap: 7 (ref 5–15)
BUN: 42 mg/dL — ABNORMAL HIGH (ref 6–20)
CO2: 20 mmol/L — ABNORMAL LOW (ref 22–32)
Calcium: 9.2 mg/dL (ref 8.9–10.3)
Chloride: 113 mmol/L — ABNORMAL HIGH (ref 101–111)
Creatinine, Ser: 1.89 mg/dL — ABNORMAL HIGH (ref 0.44–1.00)
GFR calc Af Amer: 31 mL/min — ABNORMAL LOW (ref >60)
GFR calc non Af Amer: 26 mL/min — ABNORMAL LOW (ref >60)
Glucose, Bld: 254 mg/dL — ABNORMAL HIGH (ref 65–99)
Potassium: 4.3 mmol/L (ref 3.5–5.1)
Sodium: 140 mmol/L (ref 135–145)

## 2014-09-04 LAB — CBC WITH DIFFERENTIAL/PLATELET
Basophils Absolute: 0.1 10*3/uL (ref 0–0.1)
Basophils Relative: 1 %
Eosinophils Absolute: 0.1 10*3/uL (ref 0–0.7)
Eosinophils Relative: 1 %
HCT: 31.9 % — ABNORMAL LOW (ref 35.0–47.0)
Hemoglobin: 10.6 g/dL — ABNORMAL LOW (ref 12.0–16.0)
Lymphocytes Relative: 32 %
Lymphs Abs: 2.7 10*3/uL (ref 1.0–3.6)
MCH: 29.5 pg (ref 26.0–34.0)
MCHC: 33.1 g/dL (ref 32.0–36.0)
MCV: 89.4 fL (ref 80.0–100.0)
Monocytes Absolute: 0.8 10*3/uL (ref 0.2–0.9)
Monocytes Relative: 10 %
Neutro Abs: 4.9 10*3/uL (ref 1.4–6.5)
Neutrophils Relative %: 56 %
Platelets: 207 10*3/uL (ref 150–440)
RBC: 3.57 MIL/uL — ABNORMAL LOW (ref 3.80–5.20)
RDW: 13.9 % (ref 11.5–14.5)
WBC: 8.5 10*3/uL (ref 3.6–11.0)

## 2014-09-04 LAB — GLUCOSE, CAPILLARY: Glucose-Capillary: 238 mg/dL — ABNORMAL HIGH (ref 65–99)

## 2014-09-04 NOTE — ED Notes (Signed)
Pt states that she was feeling light headed and took her blood sugar and it was 295, she usually does go up and down. She took her DM medications today but it was still elevated.

## 2014-09-05 ENCOUNTER — Telehealth: Payer: Self-pay | Admitting: Emergency Medicine

## 2015-02-17 ENCOUNTER — Other Ambulatory Visit: Payer: Self-pay | Admitting: Family Medicine

## 2015-02-17 DIAGNOSIS — Z1231 Encounter for screening mammogram for malignant neoplasm of breast: Secondary | ICD-10-CM

## 2015-03-07 ENCOUNTER — Emergency Department: Payer: Medicare Other

## 2015-03-07 ENCOUNTER — Emergency Department
Admission: EM | Admit: 2015-03-07 | Discharge: 2015-03-07 | Disposition: A | Discharge status: Home / Self Care | Payer: Medicare Other | Attending: Emergency Medicine | Admitting: Emergency Medicine

## 2015-03-07 DIAGNOSIS — S8002XA Contusion of left knee, initial encounter: Secondary | ICD-10-CM | POA: Diagnosis not present

## 2015-03-07 DIAGNOSIS — R6883 Chills (without fever): Secondary | ICD-10-CM | POA: Diagnosis not present

## 2015-03-07 DIAGNOSIS — S3991XA Unspecified injury of abdomen, initial encounter: Secondary | ICD-10-CM | POA: Diagnosis not present

## 2015-03-07 DIAGNOSIS — W1839XA Other fall on same level, initial encounter: Secondary | ICD-10-CM | POA: Diagnosis not present

## 2015-03-07 DIAGNOSIS — Y92003 Bedroom of unspecified non-institutional (private) residence as the place of occurrence of the external cause: Secondary | ICD-10-CM | POA: Diagnosis not present

## 2015-03-07 DIAGNOSIS — R110 Nausea: Secondary | ICD-10-CM | POA: Diagnosis not present

## 2015-03-07 DIAGNOSIS — E119 Type 2 diabetes mellitus without complications: Secondary | ICD-10-CM | POA: Insufficient documentation

## 2015-03-07 DIAGNOSIS — Y998 Other external cause status: Secondary | ICD-10-CM | POA: Diagnosis not present

## 2015-03-07 DIAGNOSIS — R0602 Shortness of breath: Secondary | ICD-10-CM | POA: Insufficient documentation

## 2015-03-07 DIAGNOSIS — M25462 Effusion, left knee: Secondary | ICD-10-CM | POA: Diagnosis not present

## 2015-03-07 DIAGNOSIS — S8992XA Unspecified injury of left lower leg, initial encounter: Secondary | ICD-10-CM | POA: Diagnosis present

## 2015-03-07 DIAGNOSIS — Y9301 Activity, walking, marching and hiking: Secondary | ICD-10-CM | POA: Diagnosis not present

## 2015-03-07 MED ORDER — MELOXICAM 15 MG PO TABS: 15.0000 mg | ORAL_TABLET | Freq: Every day | ORAL | Status: DC

## 2015-03-07 MED ORDER — TRAMADOL HCL 50 MG PO TABS: 50.0000 mg | ORAL_TABLET | Freq: Four times a day (QID) | ORAL | Status: AC | PRN

## 2015-03-07 NOTE — ED Notes (Signed)
Pain left knee

## 2015-03-07 NOTE — ED Notes (Signed)
Pt reports was walking through her bedroom this am and fell hurting her left knee. Pt reports pain when she walks on her left knee but is able to walk.

## 2015-03-07 NOTE — ED Provider Notes (Signed)
[facility] Emergency Department Provider Note  ____________________________________________  Time seen: Approximately 10:30 AM  I have reviewed the triage vital signs and the nursing notes.   HISTORY  Chief Complaint Fall and Knee Pain    HPI [name] [name] is a 69 y.o. female who presents with left knee pain after a fall. She was alone at home, walking to her bedroom when she fell, but she is not sure why she fell. She doesn't know if she lost consciousness. She was unable to get herself up but scooted to the phone and called EMS who arrived, helped her to a chair, and suggested she come to the ED. Her blood sugar measured by EMS then was "good." She did not feel like coming to the ED last night. The pain is 8/10. She experienced some nausea (which has since resolved), pain on her right side, intermittent chills. She slept poorly due to the pain and rolling over in bed hurts.  She has had assistance transferring from chair to bed and getting into the car, but son reports she must have walked to the front door to let him in last night. She denies head trauma, vision changes, tongue bite, incontinence of bowel and bladder, fever, recent URI, palpitations/tachycardia, abdominal pain, knee swelling, new numbness/tingling in her feet.  She denies previous falls.  She has had chronic intermittent numbness and tingling in hands and feet and shortness of breath; she wears a brace on the right foot and walks with a cane.   Of note she is on amlodipine, carvedilol, quinapril, atorvastatin, and Kombiglyze, which are managed by her PCP. She also has a cardiologist.  She is accompanied by her son.   Past Medical History  Diagnosis Date  . Diabetes mellitus without complication     There are no active problems to display for this patient.   No past surgical history on file.  Current Outpatient Rx  Name  Route  Sig  Dispense  Refill  . meloxicam (MOBIC) 15 MG  tablet   Oral   Take 1 tablet (15 mg total) by mouth daily.   30 tablet   0   . ondansetron (ZOFRAN) 4 MG tablet   Oral   Take 1 tablet (4 mg total) by mouth daily as needed for nausea or vomiting.   10 tablet   1   . traMADol (ULTRAM) 50 MG tablet   Oral   Take 1 tablet (50 mg total) by mouth every 6 (six) hours as needed.   20 tablet   0     Allergies Review of patient's allergies indicates no known allergies.  No family history on file.  Social History Social History  Substance Use Topics  . Smoking status: Never Smoker   . Smokeless tobacco: Not on file  . Alcohol Use: No    Review of Systems Constitutional: Positive for intermittent chills; No fever Eyes: No visual changes. ENT: No sore throat; no tongue bite or loose teeth; no ear or nose drainage. Cardiovascular: Denies chest pain, palpitations, tachycardia. Respiratory: Positive for chronic intermittent shortness of breath. Gastrointestinal: Positive for nausea that has now resolved. No abdominal pain.  No vomiting.  No diarrhea.  No constipation. Genitourinary: Negative for dysuria. Musculoskeletal: Positive for right side pain. Negative for neck or back pain. Skin: Negative for rash. Neurological: Negative for headaches, focal weakness or numbness.  10-point ROS otherwise negative.  ____________________________________________   PHYSICAL EXAM:  VITAL SIGNS: ED Triage Vitals  Enc Vitals  Group     BP 03/07/15 0917 146/75 mmHg     Pulse Rate 03/07/15 0917 80     Resp 03/07/15 0917 18     Temp 03/07/15 0917 98.4 F (36.9 C)     Temp Source 03/07/15 0917 Oral     SpO2 03/07/15 0917 97 %     Weight 03/07/15 0917 213 lb (96.616 kg)     Height 03/07/15 0917 5\' 7"  (1.702 m)     Head Cir --      Peak Flow --      Pain Score 03/07/15 0925 8     Pain Loc --      Pain Edu? --      Excl. in [place]? --     Constitutional: Alert and oriented. Well appearing and in no acute distress. Sitting in hospital  wheelchair. Eyes: Conjunctivae are normal. PERRL. EOMI. Head: Atraumatic. Nose: No congestion/rhinnorhea. Mouth/Throat: Mucous membranes are moist.  Oropharynx non-erythematous. Tongue atraumatic. Neck: No stridor.  No cervical spine tenderness to palpation. Cardiovascular: Normal rate, regular rhythm. Grossly normal heart sounds.  Good peripheral circulation. Respiratory: Normal respiratory effort.  No retractions. Lungs CTAB. Gastrointestinal: Tenderness to palpation right upper quadrant, but no ecchymosis. Otherwise, soft and nontender. No distention. No abdominal bruits.  Musculoskeletal: Able to stand; standing causes pain in left knee. Tenderness to palpation left knee at patellar ligament/tibial tuberosity. Strength 5/5 in hips, knees, ankles bilaterally. No hip crepitus or tenderness. No lower extremity tenderness nor edema.  No joint effusions. Neurologic:  Normal speech and language. No gross focal neurologic deficits are appreciated. No gait instability. Skin:  Skin is warm, dry and intact. No rash noted. Psychiatric: Mood and affect are normal. Speech and behavior are normal.  ____________________________________________   LABS (all labs ordered are listed, but only abnormal results are displayed)  Labs Reviewed - No data to display ____________________________________________  EKG  None ____________________________________________  RADIOLOGY  Xray of left knee.no acute osseous findings ____________________________________________   PROCEDURES  Procedure(s) performed: None  Critical Care performed: No  ____________________________________________   INITIAL IMPRESSION / ASSESSMENT AND PLAN / ED COURSE  Pertinent labs & imaging results that were available during my care of the patient were reviewed by me and considered in my medical decision making (see chart for details).  Left knee pain/contusion to left knee. Rx given for Motrin 50 mg daily and tramadol 50  mg daily. Patient follow-up with PCP or return to ER with any worsening symptomology. Knee immobilizer given for comfort.  Patient voices no other emergency medical complaints at this visit. ____________________________________________   FINAL CLINICAL IMPRESSION(S) / ED DIAGNOSES  Final diagnoses:  Knee contusion, left, initial encounter  Knee effusion, left     Arlyss Repress, PA-C 03/07/15 1441  Carrie Mew, MD 03/07/15 1538

## 2015-03-07 NOTE — Discharge Instructions (Signed)
Cryotherapy °Cryotherapy means treatment with cold. Ice or gel packs can be used to reduce both pain and swelling. Ice is the most helpful within the first 24 to 48 hours after an injury or flare-up from overusing a muscle or joint. Sprains, strains, spasms, burning pain, shooting pain, and aches can all be eased with ice. Ice can also be used when recovering from surgery. Ice is effective, has very few side effects, and is safe for most people to use. °PRECAUTIONS  °Ice is not a safe treatment option for people with: °· Raynaud phenomenon. This is a condition affecting small blood vessels in the extremities. Exposure to cold may cause your problems to return. °· Cold hypersensitivity. There are many forms of cold hypersensitivity, including: °¨ Cold urticaria. Red, itchy hives appear on the skin when the tissues begin to warm after being iced. °¨ Cold erythema. This is a red, itchy rash caused by exposure to cold. °¨ Cold hemoglobinuria. Red blood cells break down when the tissues begin to warm after being iced. The hemoglobin that carry oxygen are passed into the urine because they cannot combine with blood proteins fast enough. °· Numbness or altered sensitivity in the area being iced. °If you have any of the following conditions, do not use ice until you have discussed cryotherapy with your caregiver: °· Heart conditions, such as arrhythmia, angina, or chronic heart disease. °· High blood pressure. °· Healing wounds or open skin in the area being iced. °· Current infections. °· Rheumatoid arthritis. °· Poor circulation. °· Diabetes. °Ice slows the blood flow in the region it is applied. This is beneficial when trying to stop inflamed tissues from spreading irritating chemicals to surrounding tissues. However, if you expose your skin to cold temperatures for too long or without the proper protection, you can damage your skin or nerves. Watch for signs of skin damage due to cold. °HOME CARE INSTRUCTIONS °Follow  these tips to use ice and cold packs safely. °· Place a dry or damp towel between the ice and skin. A damp towel will cool the skin more quickly, so you may need to shorten the time that the ice is used. °· For a more rapid response, add gentle compression to the ice. °· Ice for no more than 10 to 20 minutes at a time. The bonier the area you are icing, the less time it will take to get the benefits of ice. °· Check your skin after 5 minutes to make sure there are no signs of a poor response to cold or skin damage. °· Rest 20 minutes or more between uses. °· Once your skin is numb, you can end your treatment. You can test numbness by very lightly touching your skin. The touch should be so light that you do not see the skin dimple from the pressure of your fingertip. When using ice, most people will feel these normal sensations in this order: cold, burning, aching, and numbness. °· Do not use ice on someone who cannot communicate their responses to pain, such as small children or people with dementia. °HOW TO MAKE AN ICE PACK °Ice packs are the most common way to use ice therapy. Other methods include ice massage, ice baths, and cryosprays. Muscle creams that cause a cold, tingly feeling do not offer the same benefits that ice offers and should not be used as a substitute unless recommended by your caregiver. °To make an ice pack, do one of the following: °· Place crushed ice or a   bag of frozen vegetables in a sealable plastic bag. Squeeze out the excess air. Place this bag inside another plastic bag. Slide the bag into a pillowcase or place a damp towel between your skin and the bag.  Mix 3 parts water with 1 part rubbing alcohol. Freeze the mixture in a sealable plastic bag. When you remove the mixture from the freezer, it will be slushy. Squeeze out the excess air. Place this bag inside another plastic bag. Slide the bag into a pillowcase or place a damp towel between your skin and the bag. SEEK MEDICAL CARE  IF:  You develop white spots on your skin. This may give the skin a blotchy (mottled) appearance.  Your skin turns blue or pale.  Your skin becomes waxy or hard.  Your swelling gets worse. MAKE SURE YOU:   Understand these instructions.  Will watch your condition.  Will get help right away if you are not doing well or get worse.   This information is not intended to replace advice given to you by your health care provider. Make sure you discuss any questions you have with your health care provider.   Document Released: 11/19/2010 Document Revised: 04/15/2014 Document Reviewed: 11/19/2010 Elsevier Interactive Patient Education 2016 [employer]  Knee Effusion Knee effusion means that you have excess fluid in your knee joint. This can cause pain and swelling in your knee. This may make your knee more difficult to bend and move. That is because there is increased pain and pressure in the joint. If there is fluid in your knee, it often means that something is wrong inside your knee, such as severe arthritis, abnormal inflammation, or an infection. Another common cause of knee effusion is an injury to the knee muscles, ligaments, or cartilage. HOME CARE INSTRUCTIONS  Use crutches as directed by your health care provider.  Wear a knee brace as directed by your health care provider.  Apply ice to the swollen area:  Put ice in a plastic bag.  Place a towel between your skin and the bag.  Leave the ice on for 20 minutes, 2-3 times per day.  Keep your knee raised (elevated) when you are sitting or lying down.  Take medicines only as directed by your health care provider.  Do any rehabilitation or strengthening exercises as directed by your health care provider.  Rest your knee as directed by your health care provider. You may start doing your normal activities again when your health care provider approves.   Keep all follow-up visits as directed by your health care provider.  This is important. SEEK MEDICAL CARE IF:  You have ongoing (persistent) pain in your knee. SEEK IMMEDIATE MEDICAL CARE IF:  You have increased swelling or redness of your knee.  You have severe pain in your knee.  You have a fever.   This information is not intended to replace advice given to you by your health care provider. Make sure you discuss any questions you have with your health care provider.   Document Released: 06/15/2003 Document Revised: 04/15/2014 Document Reviewed: 11/08/2013 Elsevier Interactive Patient Education [employer].

## 2015-04-11 ENCOUNTER — Ambulatory Visit: Payer: Medicare Other | Attending: Family Medicine

## 2015-05-10 ENCOUNTER — Emergency Department
Admission: EM | Admit: 2015-05-10 | Discharge: 2015-05-10 | Disposition: A | Discharge status: Home / Self Care | Payer: Medicare Other | Attending: Emergency Medicine | Admitting: Emergency Medicine

## 2015-05-10 ENCOUNTER — Encounter: Payer: Self-pay | Admitting: *Deleted

## 2015-05-10 DIAGNOSIS — E119 Type 2 diabetes mellitus without complications: Secondary | ICD-10-CM | POA: Diagnosis not present

## 2015-05-10 DIAGNOSIS — N390 Urinary tract infection, site not specified: Secondary | ICD-10-CM | POA: Insufficient documentation

## 2015-05-10 DIAGNOSIS — R112 Nausea with vomiting, unspecified: Secondary | ICD-10-CM | POA: Diagnosis not present

## 2015-05-10 DIAGNOSIS — R531 Weakness: Secondary | ICD-10-CM | POA: Diagnosis not present

## 2015-05-10 DIAGNOSIS — R42 Dizziness and giddiness: Secondary | ICD-10-CM | POA: Diagnosis not present

## 2015-05-10 DIAGNOSIS — Z791 Long term (current) use of non-steroidal anti-inflammatories (NSAID): Secondary | ICD-10-CM | POA: Diagnosis not present

## 2015-05-10 DIAGNOSIS — R630 Anorexia: Secondary | ICD-10-CM | POA: Insufficient documentation

## 2015-05-10 LAB — URINALYSIS COMPLETE WITH MICROSCOPIC (ARMC ONLY)
Bilirubin Urine: NEGATIVE
Glucose, UA: NEGATIVE mg/dL
Hgb urine dipstick: NEGATIVE
Ketones, ur: NEGATIVE mg/dL
Nitrite: NEGATIVE
Protein, ur: 100 mg/dL — AB
Specific Gravity, Urine: 1.017 (ref 1.005–1.030)
pH: 5 (ref 5.0–8.0)

## 2015-05-10 LAB — TROPONIN I: Troponin I: <0.03 ng/mL (ref <0.031)

## 2015-05-10 LAB — COMPREHENSIVE METABOLIC PANEL
ALT: 9 U/L — ABNORMAL LOW (ref 14–54)
AST: 16 U/L (ref 15–41)
Albumin: 4 g/dL (ref 3.5–5.0)
Alkaline Phosphatase: 93 U/L (ref 38–126)
Anion gap: 6 (ref 5–15)
BUN: 27 mg/dL — ABNORMAL HIGH (ref 6–20)
CO2: 22 mmol/L (ref 22–32)
Calcium: 9.2 mg/dL (ref 8.9–10.3)
Chloride: 108 mmol/L (ref 101–111)
Creatinine, Ser: 1.84 mg/dL — ABNORMAL HIGH (ref 0.44–1.00)
GFR calc Af Amer: 31 mL/min — ABNORMAL LOW (ref >60)
GFR calc non Af Amer: 27 mL/min — ABNORMAL LOW (ref >60)
Glucose, Bld: 130 mg/dL — ABNORMAL HIGH (ref 65–99)
Potassium: 4.3 mmol/L (ref 3.5–5.1)
Sodium: 136 mmol/L (ref 135–145)
Total Bilirubin: 0.5 mg/dL (ref 0.3–1.2)
Total Protein: 8.6 g/dL — ABNORMAL HIGH (ref 6.5–8.1)

## 2015-05-10 LAB — CBC
HCT: 34.6 % — ABNORMAL LOW (ref 35.0–47.0)
Hemoglobin: 11.3 g/dL — ABNORMAL LOW (ref 12.0–16.0)
MCH: 29 pg (ref 26.0–34.0)
MCHC: 32.5 g/dL (ref 32.0–36.0)
MCV: 89.1 fL (ref 80.0–100.0)
Platelets: 253 10*3/uL (ref 150–440)
RBC: 3.88 MIL/uL (ref 3.80–5.20)
RDW: 14.1 % (ref 11.5–14.5)
WBC: 8.5 10*3/uL (ref 3.6–11.0)

## 2015-05-10 LAB — LIPASE, BLOOD: Lipase: 34 U/L (ref 11–51)

## 2015-05-10 MED ORDER — CEPHALEXIN 500 MG PO CAPS
500.0000 mg | ORAL_CAPSULE | Freq: Once | ORAL | Status: AC
  Administered 2015-05-10: 500 mg via ORAL
  Filled 2015-05-10: qty 1

## 2015-05-10 MED ORDER — CEPHALEXIN 500 MG PO CAPS: 500.0000 mg | ORAL_CAPSULE | Freq: Four times a day (QID) | ORAL | Status: DC

## 2015-05-10 MED ORDER — CEPHALEXIN 500 MG PO CAPS
ORAL_CAPSULE | ORAL | Status: AC
  Administered 2015-05-10: 500 mg via ORAL
  Filled 2015-05-10: qty 1

## 2015-05-10 MED ORDER — ONDANSETRON 4 MG PO TBDP
4.0000 mg | ORAL_TABLET | Freq: Once | ORAL | Status: AC
  Administered 2015-05-10: 4 mg via ORAL
  Filled 2015-05-10: qty 1

## 2015-05-10 MED ORDER — ONDANSETRON 4 MG PO TBDP
4.0000 mg | ORAL_TABLET | Freq: Three times a day (TID) | ORAL | Status: DC | PRN
Start: 2015-05-10 — End: 2018-04-24

## 2015-05-10 MED ORDER — SULFAMETHOXAZOLE-TRIMETHOPRIM 800-160 MG PO TABS: 1.0000 | ORAL_TABLET | Freq: Once | ORAL | Status: DC

## 2015-05-10 NOTE — ED Provider Notes (Signed)
[facility] Emergency Department Provider Note  ____________________________________________  Time seen: Approximately 6:08 PM  I have reviewed the triage vital signs and the nursing notes.   HISTORY  Chief Complaint Emesis    HPI [name] [name] is a 70 y.o. female with a history of DM presenting with 3 weeks of generalized weakness, nausea and right-sided pain. The patient reports that 3 weeks ago she began to have some generalized weakness and lightheadedness without dizziness which made it hard for her to get out of bed. She had no other associated symptoms until 3 days ago when she developed nausea and over the past 3 days has had 3-4 episodes of vomiting. She has had decreased appetite and has not been eating and drinking because she does not want to throw up. She has also had a mild intermittent right sided pain. No urinary symptoms. She denies any fever, chills.  Last BM was this morning and it was normal; previous BM was 7 days ago but she states that 1-2 weeks between bowel movements is normal for her. No recent changes in her medications.   Past Medical History  Diagnosis Date  . Diabetes mellitus without complication ([place])     There are no active problems to display for this patient.   No past surgical history on file.  Current Outpatient Rx  Name  Route  Sig  Dispense  Refill  . cephALEXin (KEFLEX) 500 MG capsule   Oral   Take 1 capsule (500 mg total) by mouth 4 (four) times daily.   28 capsule   0   . meloxicam (MOBIC) 15 MG tablet   Oral   Take 1 tablet (15 mg total) by mouth daily.   30 tablet   0   . ondansetron (ZOFRAN) 4 MG tablet   Oral   Take 1 tablet (4 mg total) by mouth daily as needed for nausea or vomiting.   10 tablet   1   . ondansetron (ZOFRAN-ODT) 4 MG disintegrating tablet   Oral   Take 1 tablet (4 mg total) by mouth every 8 (eight) hours as needed for nausea or vomiting.   20 tablet   0   . traMADol (ULTRAM)  50 MG tablet   Oral   Take 1 tablet (50 mg total) by mouth every 6 (six) hours as needed.   20 tablet   0     Allergies Review of patient's allergies indicates no known allergies.  No family history on file.  Social History Social History  Substance Use Topics  . Smoking status: Never Smoker   . Smokeless tobacco: None  . Alcohol Use: No    Review of Systems Constitutional: No fever/chills. Positive generalized weakness. Negative syncope. Positive lightheadedness. Eyes: No visual changes. ENT: No sore throat. Cardiovascular: Denies chest pain, palpitations. Respiratory: Denies shortness of breath.  No cough. Gastrointestinal: No abdominal pain; mild intermittent right side pain.  Positive nausea, positive vomiting.  No diarrhea.  No constipation. Positive anorexia. Genitourinary: Negative for dysuria. Musculoskeletal: Negative for back pain. Skin: Negative for rash. Neurological: Negative for headaches, focal weakness or numbness. No tingling.  10-point ROS otherwise negative.  ____________________________________________   PHYSICAL EXAM:  VITAL SIGNS: ED Triage Vitals  Enc Vitals Group     BP 05/10/15 1525 146/81 mmHg     Pulse Rate 05/10/15 1525 68     Resp 05/10/15 1525 20     Temp 05/10/15 1525 97.5 F (36.4 C)  Temp Source 05/10/15 1525 Oral     SpO2 05/10/15 1525 98 %     Weight 05/10/15 1525 206 lb (93.441 kg)     Height 05/10/15 1525 5\' 7"  (1.702 m)     Head Cir --      Peak Flow --      Pain Score --      Pain Loc --      Pain Edu? --      Excl. in [place]? --     Constitutional: Patient is alert and oriented and answering questions appropriately. She is chronically ill-appearing but otherwise is able to ambulate and is nontoxic.  Eyes: Conjunctivae are normal.  EOMI. no scleral icterus. Head: Atraumatic. Nose: No congestion/rhinnorhea. Mouth/Throat: Mucous membranes are dry.  Neck: No stridor.  Supple.  No JVD Cardiovascular: Normal rate,  regular rhythm. No murmurs, rubs or gallops.  Respiratory: Normal respiratory effort.  No retractions. Lungs CTAB.  No wheezes, rales or ronchi. Gastrointestinal: Abdomen is soft, nondistended and nontender. She has no CVA ttp.  No guarding, rebound, or peritoneal signs. Negative Murphy sign. Musculoskeletal: Symmetric bilateral lower extremity edema Neurologic:  Normal speech and language. No gross focal neurologic deficits are appreciated.  Skin:  Skin is warm, dry and intact. No rash noted. Psychiatric: Mood and affect are normal. Speech and behavior are normal.  Normal judgement.  ____________________________________________   LABS (all labs ordered are listed, but only abnormal results are displayed)  Labs Reviewed  COMPREHENSIVE METABOLIC PANEL - Abnormal; Notable for the following:    Glucose, Bld 130 (*)    BUN 27 (*)    Creatinine, Ser 1.84 (*)    Total Protein 8.6 (*)    ALT 9 (*)    GFR calc non Af Amer 27 (*)    GFR calc Af Amer 31 (*)    All other components within normal limits  CBC - Abnormal; Notable for the following:    Hemoglobin 11.3 (*)    HCT 34.6 (*)    All other components within normal limits  URINALYSIS COMPLETEWITH MICROSCOPIC ([facility] ONLY) - Abnormal; Notable for the following:    Color, Urine YELLOW (*)    APPearance CLOUDY (*)    Protein, ur 100 (*)    Leukocytes, UA 3+ (*)    Bacteria, UA RARE (*)    Squamous Epithelial / LPF 6-30 (*)    All other components within normal limits  LIPASE, BLOOD  TROPONIN I   ____________________________________________  EKG  ED ECG REPORT I, Eula Listen, the attending physician, personally viewed and interpreted this ECG.   Date: 05/10/2015  EKG Time: 1834  Rate: 64  Rhythm: normal sinus rhythm, with PACs  Axis: Normal  Intervals:none  ST&T Change: No ST elevation. No ischemic changes.  ____________________________________________  RADIOLOGY  No results  found.  ____________________________________________   PROCEDURES  Procedure(s) performed: None  Critical Care performed: No ____________________________________________   INITIAL IMPRESSION / ASSESSMENT AND PLAN / ED COURSE  Pertinent labs & imaging results that were available during my care of the patient were reviewed by me and considered in my medical decision making (see chart for details).  70 y.o. female with a history of DM presenting with 3 weeks of generalized weakness and lightheadedness, now with 3 days of 3-4 episodes of vomiting. Overall, the patient is nontoxic appearing with stable vital signs. She does not have any acute pathology on her abdominal examination. She has a creatinine which is elevated but baseline for her.  She does have some mild hyperglycemia without DKA. Her also suspending but we will evaluate for infection. She does have very dry mucous membranes so I suggested and offered IV fluids, but she has declined at this time. She prefers to take by mouth liquid.  ----------------------------------------- 7:17 PM on 05/10/2015 -----------------------------------------  The patient has tolerated 3 entire cups of water and graham crackers without any vomiting. I'm awaiting her urinalysis but anticipate discharge home.    ----------------------------------------- 7:42 PM on 05/10/2015 -----------------------------------------   The patient does continue to do well. She has a UTI so I will send her home with Keflex and give her the first dose here.  ____________________________________________  FINAL CLINICAL IMPRESSION(S) / ED DIAGNOSES  Final diagnoses:  Generalized weakness  Lightheadedness  Non-intractable vomiting with nausea, vomiting of unspecified type  UTI (lower urinary tract infection)      NEW MEDICATIONS STARTED DURING THIS VISIT:  New Prescriptions   CEPHALEXIN (KEFLEX) 500 MG CAPSULE    Take 1 capsule (500 mg total) by mouth 4  (four) times daily.   ONDANSETRON (ZOFRAN-ODT) 4 MG DISINTEGRATING TABLET    Take 1 tablet (4 mg total) by mouth every 8 (eight) hours as needed for nausea or vomiting.     Eula Listen, MD 05/10/15 1945

## 2015-05-10 NOTE — ED Notes (Signed)
Pt to triage via wheelchair.  Pt reports right side abd pain for 3 weeks.  Intermittent pain.   No vomiting today but was vomiting yesterday.  No diarrhea.  Pt alert.

## 2015-05-10 NOTE — Discharge Instructions (Signed)
Please drink plenty of fluid to stay well-hydrated. Please eat small regular meals throughout the day to keep up your energy. Please take the entire course of antibiotics, even if you're feeling better.  Make an appointment to follow up with her primary care physician in one week.  Return to the emergency department if you develop severe pain, inability to keep down fluids, fever, or any other symptoms concerning to you.

## 2015-05-10 NOTE — ED Notes (Signed)
Unable to void at this time.

## 2015-05-10 NOTE — ED Notes (Signed)
Pt able to maintain PO intake of 919ml of water, several crackers and peanut butter with no obvious vomiting at this time.

## 2015-05-12 LAB — URINE CULTURE

## 2015-06-09 DIAGNOSIS — R0602 Shortness of breath: Secondary | ICD-10-CM | POA: Insufficient documentation

## 2015-06-09 DIAGNOSIS — R079 Chest pain, unspecified: Secondary | ICD-10-CM | POA: Insufficient documentation

## 2016-05-15 DIAGNOSIS — E119 Type 2 diabetes mellitus without complications: Secondary | ICD-10-CM | POA: Insufficient documentation

## 2016-05-15 DIAGNOSIS — R112 Nausea with vomiting, unspecified: Secondary | ICD-10-CM | POA: Diagnosis not present

## 2016-05-15 DIAGNOSIS — E860 Dehydration: Secondary | ICD-10-CM | POA: Diagnosis not present

## 2016-05-15 DIAGNOSIS — I10 Essential (primary) hypertension: Secondary | ICD-10-CM | POA: Diagnosis not present

## 2016-05-15 DIAGNOSIS — R531 Weakness: Secondary | ICD-10-CM | POA: Diagnosis present

## 2016-05-16 ENCOUNTER — Encounter: Payer: Self-pay | Admitting: Emergency Medicine

## 2016-05-16 ENCOUNTER — Emergency Department
Admission: EM | Admit: 2016-05-16 | Discharge: 2016-05-16 | Disposition: A | Discharge status: Home / Self Care | Payer: Medicare Other | Attending: Emergency Medicine | Admitting: Emergency Medicine

## 2016-05-16 DIAGNOSIS — E860 Dehydration: Secondary | ICD-10-CM | POA: Diagnosis not present

## 2016-05-16 DIAGNOSIS — R531 Weakness: Secondary | ICD-10-CM

## 2016-05-16 HISTORY — DX: Essential (primary) hypertension: I10

## 2016-05-16 LAB — URINALYSIS, COMPLETE (UACMP) WITH MICROSCOPIC
Bacteria, UA: NONE SEEN
Bilirubin Urine: NEGATIVE
Glucose, UA: NEGATIVE mg/dL
Hgb urine dipstick: NEGATIVE
Ketones, ur: NEGATIVE mg/dL
Leukocytes, UA: NEGATIVE
Nitrite: NEGATIVE
Protein, ur: 100 mg/dL — AB
Specific Gravity, Urine: 1.012 (ref 1.005–1.030)
pH: 6 (ref 5.0–8.0)

## 2016-05-16 LAB — CBC
HCT: 33.4 % — ABNORMAL LOW (ref 35.0–47.0)
Hemoglobin: 11.2 g/dL — ABNORMAL LOW (ref 12.0–16.0)
MCH: 30.1 pg (ref 26.0–34.0)
MCHC: 33.6 g/dL (ref 32.0–36.0)
MCV: 89.8 fL (ref 80.0–100.0)
Platelets: 194 10*3/uL (ref 150–440)
RBC: 3.72 MIL/uL — ABNORMAL LOW (ref 3.80–5.20)
RDW: 14.9 % — ABNORMAL HIGH (ref 11.5–14.5)
WBC: 6.9 10*3/uL (ref 3.6–11.0)

## 2016-05-16 LAB — BASIC METABOLIC PANEL
Anion gap: 9 (ref 5–15)
BUN: 26 mg/dL — ABNORMAL HIGH (ref 6–20)
CO2: 21 mmol/L — ABNORMAL LOW (ref 22–32)
Calcium: 8.9 mg/dL (ref 8.9–10.3)
Chloride: 106 mmol/L (ref 101–111)
Creatinine, Ser: 1.99 mg/dL — ABNORMAL HIGH (ref 0.44–1.00)
GFR calc Af Amer: 28 mL/min — ABNORMAL LOW (ref >60)
GFR calc non Af Amer: 24 mL/min — ABNORMAL LOW (ref >60)
Glucose, Bld: 196 mg/dL — ABNORMAL HIGH (ref 65–99)
Potassium: 4.2 mmol/L (ref 3.5–5.1)
Sodium: 136 mmol/L (ref 135–145)

## 2016-05-16 LAB — TROPONIN I: Troponin I: <0.03 ng/mL (ref <0.03)

## 2016-05-16 MED ORDER — BENZONATATE 100 MG PO CAPS
100.0000 mg | ORAL_CAPSULE | Freq: Once | ORAL | Status: AC
  Administered 2016-05-16: 100 mg via ORAL

## 2016-05-16 MED ORDER — BENZONATATE 100 MG PO CAPS: 100.0000 mg | ORAL_CAPSULE | Freq: Three times a day (TID) | ORAL | 0 refills | Status: AC | PRN

## 2016-05-16 MED ORDER — BENZONATATE 100 MG PO CAPS
ORAL_CAPSULE | ORAL | Status: AC
  Filled 2016-05-16: qty 1

## 2016-05-16 MED ORDER — SODIUM CHLORIDE 0.9 % IV BOLUS (SEPSIS)
1000.0000 mL | Freq: Once | INTRAVENOUS | Status: AC
  Administered 2016-05-16: 1000 mL via INTRAVENOUS

## 2016-05-16 NOTE — Discharge Instructions (Signed)
Please seek medical attention for any high fevers, chest pain, shortness of breath, change in behavior, persistent vomiting, bloody stool or any other new or concerning symptoms.  

## 2016-05-16 NOTE — ED Provider Notes (Signed)
[facility] Emergency Department Provider Note   ____________________________________________   I have reviewed the triage vital signs and the nursing notes.   HISTORY  Chief Complaint Weakness and Nausea   History limited by: Not Limited   HPI [name] [name] is a 71 y.o. female who presents to the emergency department today because of concerns for weakness and nausea. The patient states that the weakness started roughly 24 hours ago. She describes it as being a difficulty with walking. She describes weakness in both legs. She states that during the day she stayed in bed. She did have some nausea and vomited twice. This was nonbloody. Tonight she felt continued weakness in her legs. Given this weakness there was some concern for possible infection. She however denied any fevers although stated she had some chills.   Past Medical History:  Diagnosis Date  . Diabetes mellitus without complication ([place])   . Hypertension     There are no active problems to display for this patient.   History reviewed. No pertinent surgical history.  Prior to Admission medications   Medication Sig Start Date End Date Taking? Authorizing Provider  cephALEXin (KEFLEX) 500 MG capsule Take 1 capsule (500 mg total) by mouth 4 (four) times daily. 05/10/15   Eula Listen, MD  meloxicam (MOBIC) 15 MG tablet Take 1 tablet (15 mg total) by mouth daily. 03/07/15   Pierce Crane Beers, PA-C  ondansetron (ZOFRAN-ODT) 4 MG disintegrating tablet Take 1 tablet (4 mg total) by mouth every 8 (eight) hours as needed for nausea or vomiting. 05/10/15   Eula Listen, MD    Allergies Patient has no known allergies.  History reviewed. No pertinent family history.  Social History Social History  Substance Use Topics  . Smoking status: Never Smoker  . Smokeless tobacco: Never Used  . Alcohol use No    Review of Systems  Constitutional: Negative for fever. Positive for  chills. Cardiovascular: Negative for chest pain. Respiratory: Negative for shortness of breath. Gastrointestinal: Negative for abdominal pain. Positive for nausea and weakness.  Neurological: Negative for headaches, focal weakness or numbness.  10-point ROS otherwise negative.  ____________________________________________   PHYSICAL EXAM:  VITAL SIGNS: ED Triage Vitals [05/16/16 0004]  Enc Vitals Group     BP (!) 156/61     Pulse Rate 75     Resp 20     Temp 99.6 F (37.6 C)     Temp Source Oral     SpO2 97 %     Weight 202 lb (91.6 kg)     Height 5\' 7"  (1.702 m)     Head Circumference      Peak Flow      Pain Score 0    Constitutional: Alert and oriented. Well appearing and in no distress. Eyes: Conjunctivae are normal. Normal extraocular movements. ENT   Head: Normocephalic and atraumatic.   Nose: No congestion/rhinnorhea.   Mouth/Throat: Mucous membranes are moist.   Neck: No stridor. Hematological/Lymphatic/Immunilogical: No cervical lymphadenopathy. Cardiovascular: Normal rate, regular rhythm.  No murmurs, rubs, or gallops. Respiratory: Normal respiratory effort without tachypnea nor retractions. Breath sounds are clear and equal bilaterally. No wheezes/rales/rhonchi. Gastrointestinal: Soft and non tender. No rebound. No guarding.  Genitourinary: Deferred Musculoskeletal: Normal range of motion in all extremities. No lower extremity edema. Neurologic:  Normal speech and language. Face symmetric. Tongue midline. PERRL. EOMI. Strength 5/5 in upper and lower extremities. Finger to nose normal. No gross focal neurologic deficits are appreciated.  Skin:  Skin is warm, dry and intact. No rash noted. Psychiatric: Mood and affect are normal. Speech and behavior are normal. Patient exhibits appropriate insight and judgment.  ____________________________________________    LABS (pertinent positives/negatives)  Labs Reviewed  BASIC METABOLIC PANEL -  Abnormal; Notable for the following:       Result Value   CO2 21 (*)    Glucose, Bld 196 (*)    BUN 26 (*)    Creatinine, Ser 1.99 (*)    GFR calc non Af Amer 24 (*)    GFR calc Af Amer 28 (*)    All other components within normal limits  CBC - Abnormal; Notable for the following:    RBC 3.72 (*)    Hemoglobin 11.2 (*)    HCT 33.4 (*)    RDW 14.9 (*)    All other components within normal limits  URINALYSIS, COMPLETE (UACMP) WITH MICROSCOPIC - Abnormal; Notable for the following:    Color, Urine YELLOW (*)    APPearance CLEAR (*)    Protein, ur 100 (*)    Squamous Epithelial / LPF 0-5 (*)    All other components within normal limits  TROPONIN I     ____________________________________________   EKG  I, Nance Pear, attending physician, personally viewed and interpreted this EKG  EKG Time: 0012 Rate: 77 Rhythm: sinus rhythm with PAC Axis: normal Intervals: qtc 434 QRS: narrow ST changes: no st elevation Impression: normal EKG with PAC   ____________________________________________    RADIOLOGY  None   ____________________________________________   PROCEDURES  Procedures  ____________________________________________   INITIAL IMPRESSION / ASSESSMENT AND PLAN / ED COURSE  Pertinent labs & imaging results that were available during my care of the patient were reviewed by me and considered in my medical decision making (see chart for details).  Patient presented to the emergency department today with concerns for weakness. The patient blood work here showed some anemia which is consistent with the patient's baseline. Additionally showed elevation of creatinine which again is somewhat consistent with the patient's baseline. The patient was given IV fluids and stated she did feel better. We did get the patient up and she stated she felt stronger. At this point think patient might have a slight viral illness possibly some dehydration. Certainly troponin and  EKG do not suggest a cardiac cause. Will plan on discharging patient to follow-up with primary care.  ____________________________________________   FINAL CLINICAL IMPRESSION(S) / ED DIAGNOSES  Final diagnoses:  Weakness  Dehydration     Note: This dictation was prepared with Dragon dictation. Any transcriptional errors that result from this process are unintentional     Nance Pear, MD 05/16/16 [phone]

## 2016-05-16 NOTE — ED Triage Notes (Signed)
Pt to triage via [place] reports leg weakness and nausea since last night, reports she was told it may be UTI.

## 2016-09-09 ENCOUNTER — Other Ambulatory Visit: Payer: Self-pay | Admitting: Family Medicine

## 2016-09-09 DIAGNOSIS — Z1231 Encounter for screening mammogram for malignant neoplasm of breast: Secondary | ICD-10-CM

## 2016-09-14 ENCOUNTER — Emergency Department: Payer: Medicare Other

## 2016-09-14 ENCOUNTER — Encounter: Payer: Self-pay | Admitting: Emergency Medicine

## 2016-09-14 ENCOUNTER — Emergency Department
Admission: EM | Admit: 2016-09-14 | Discharge: 2016-09-14 | Disposition: A | Discharge status: Home / Self Care | Payer: Medicare Other | Attending: Emergency Medicine | Admitting: Emergency Medicine

## 2016-09-14 DIAGNOSIS — Y999 Unspecified external cause status: Secondary | ICD-10-CM | POA: Diagnosis not present

## 2016-09-14 DIAGNOSIS — W010XXA Fall on same level from slipping, tripping and stumbling without subsequent striking against object, initial encounter: Secondary | ICD-10-CM | POA: Diagnosis not present

## 2016-09-14 DIAGNOSIS — S99911A Unspecified injury of right ankle, initial encounter: Secondary | ICD-10-CM | POA: Diagnosis present

## 2016-09-14 DIAGNOSIS — Y939 Activity, unspecified: Secondary | ICD-10-CM | POA: Diagnosis not present

## 2016-09-14 DIAGNOSIS — Z794 Long term (current) use of insulin: Secondary | ICD-10-CM | POA: Insufficient documentation

## 2016-09-14 DIAGNOSIS — Y929 Unspecified place or not applicable: Secondary | ICD-10-CM | POA: Insufficient documentation

## 2016-09-14 DIAGNOSIS — S93401A Sprain of unspecified ligament of right ankle, initial encounter: Secondary | ICD-10-CM | POA: Diagnosis not present

## 2016-09-14 DIAGNOSIS — Z7984 Long term (current) use of oral hypoglycemic drugs: Secondary | ICD-10-CM | POA: Diagnosis not present

## 2016-09-14 DIAGNOSIS — E119 Type 2 diabetes mellitus without complications: Secondary | ICD-10-CM | POA: Insufficient documentation

## 2016-09-14 DIAGNOSIS — S93601A Unspecified sprain of right foot, initial encounter: Secondary | ICD-10-CM | POA: Diagnosis not present

## 2016-09-14 DIAGNOSIS — I10 Essential (primary) hypertension: Secondary | ICD-10-CM | POA: Diagnosis not present

## 2016-09-14 MED ORDER — TRAMADOL HCL 50 MG PO TABS: 50.0000 mg | ORAL_TABLET | Freq: Two times a day (BID) | ORAL | 0 refills | Status: DC

## 2016-09-14 MED ORDER — TRAMADOL HCL 50 MG PO TABS
ORAL_TABLET | ORAL | Status: AC
  Filled 2016-09-14: qty 1

## 2016-09-14 MED ORDER — TRAMADOL HCL 50 MG PO TABS
50.0000 mg | ORAL_TABLET | Freq: Once | ORAL | Status: AC
  Administered 2016-09-14: 50 mg via ORAL

## 2016-09-14 NOTE — ED Triage Notes (Signed)
Pt to triage via [place] in [place], reports tripped and fell when going through a door, pain/swelling to right ankle/heel.  Pt ambulatory with difficulty after fall.

## 2016-09-14 NOTE — Discharge Instructions (Signed)
Your exam and x-ray do not reveal any acute fracture or dislocation. Wear the ace bandage as needed for comfort. Follow-up with Dr. Cleda Mccreedy as needed. Take the pain medicine as needed.

## 2016-09-14 NOTE — ED Provider Notes (Signed)
[facility] Emergency Department Provider Note ____________________________________________  Time seen: 2212  I have reviewed the triage vital signs and the nursing notes.  HISTORY  Chief Complaint  Fall and Foot Injury (right)  HPI [name] [name] is a 71 y.o. female presents to the ED accompanied by her adult son, for evaluation of injury sustained following a mechanical fall. 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 She describes rolling her right foot and ankle. She was initially unable to bear weight to the foot, but now reports pain is significantly improved. She only notes some mild intermittent discomfort to the plantar heel surface. She denies any other injury at this time.  Past Medical History:  Diagnosis Date  . Diabetes mellitus without complication ([place])   . Hypertension     There are no active problems to display for this patient.   History reviewed. No pertinent surgical history.  Prior to Admission medications   Medication Sig Start Date End Date Taking? Authorizing Provider  amLODipine (NORVASC) 10 MG tablet Take 1 tablet by mouth daily. 05/02/16   [provider]  atorvastatin (LIPITOR) 40 MG tablet Take 1 tablet by mouth daily. 05/02/16   [provider]  benzonatate (TESSALON PERLES) 100 MG capsule Take 1 capsule (100 mg total) by mouth 3 (three) times daily as needed for cough. 05/16/16 05/16/17  Nance Pear, MD  carvedilol (COREG) 25 MG tablet Take 1 tablet by mouth 2 (two) times daily. 05/02/16   [provider]  cephALEXin (KEFLEX) 500 MG capsule Take 1 capsule (500 mg total) by mouth 4 (four) times daily. 05/10/15   Eula Listen, MD  KOMBIGLYZE XR 08-998 MG TB24 Take 1 tablet by mouth 2 (two) times daily. 05/06/16   [provider]  meloxicam (MOBIC) 15 MG tablet  Take 1 tablet (15 mg total) by mouth daily. 03/07/15   Beers, Pierce Crane, PA-C  ondansetron (ZOFRAN-ODT) 4 MG disintegrating tablet Take 1 tablet (4 mg total) by mouth every 8 (eight) hours as needed for nausea or vomiting. 05/10/15   Eula Listen, MD  quinapril (ACCUPRIL) 40 MG tablet Take 1 tablet by mouth 2 (two) times daily. 05/02/16   [provider]  TANZEUM 30 MG PEN Inject 1 Dose into the skin daily. 05/10/16   [provider]  traMADol (ULTRAM) 50 MG tablet Take 1 tablet (50 mg total) by mouth 2 (two) times daily. 09/14/16   Tiffney Haughton, Dannielle Karvonen, PA-C    Allergies Patient has no known allergies.  History reviewed. No pertinent family history.  Social History Social History  Substance Use Topics  . Smoking status: Never Smoker  . Smokeless tobacco: Never Used  . Alcohol use No    Review of Systems  Constitutional: Negative for fever. Musculoskeletal: Negative for back pain. Right foot pain as above Skin: Negative for rash. Neurological: Negative for headaches, focal weakness or numbness. ____________________________________________  PHYSICAL EXAM:  VITAL SIGNS: ED Triage Vitals  Enc Vitals Group     BP 09/14/16 2048 (!) 150/63     Pulse Rate 09/14/16 2048 78     Resp 09/14/16 2048 18     Temp 09/14/16 2048 98.9 F (37.2 C)     Temp Source 09/14/16 2048 Oral     SpO2 09/14/16 2048 97 %     Weight 09/14/16 2048 179 lb (81.2 kg)  Height 09/14/16 2048 5\' 7"  (1.702 m)     Head Circumference --      Peak Flow --      Pain Score 09/14/16 2047 7     Pain Loc --      Pain Edu? --      Excl. in [place]? --     Constitutional: Alert and oriented. Well appearing and in no distress. Head: Normocephalic and atraumatic. Cardiovascular: Normal rate, regular rhythm. Normal distal pulses. Respiratory: Normal respiratory effort. No wheezes/rales/rhonchi. Musculoskeletal: Right foot and ankle with chronic Charcot deformity noted. Patient without  significant tenderness to palpation over the medial or lateral malleoli. Normal ankle range of motion exam. No significant Or Achilles tenderness is appreciated. Nontender with normal range of motion in all extremities.  Neurologic: Normal gross sensation. Normal speech and language. No gross focal neurologic deficits are appreciated. Skin:  Skin is warm, dry and intact. No rash noted. ____________________________________________   RADIOLOGY  Right Ankle  IMPRESSION: Chronic ankle deformity with distortion normal hindfoot alignment in morphology. No evidence of acute fracture.  I, Yamil Dougher, Dannielle Karvonen, personally viewed and evaluated these images (plain radiographs) as part of my medical decision making, as well as reviewing the written report by the radiologist. ____________________________________________  PROCEDURES  Ultram 50 mg PO Ace bandage ____________________________________________  INITIAL IMPRESSION / ASSESSMENT AND PLAN / ED COURSE  Patient with an acute right ankle and foot sprain status post mechanical fall. No radiologic evidence of acute fracture or dislocation. The patient is fitted with a Ace bandage for comfort and support. She is also provided with a small prescription for Ultram for breakthrough pain. She will follow with her podiatrist for ongoing symptom management.  ____________________________________________  FINAL CLINICAL IMPRESSION(S) / ED DIAGNOSES  Final diagnoses:  Sprain of right ankle, unspecified ligament, initial encounter  Foot sprain, right, initial encounter      Melvenia Needles, PA-C 09/14/16 2305    Earleen Newport, MD 09/14/16 2316

## 2016-09-24 ENCOUNTER — Ambulatory Visit
Admission: RE | Admit: 2016-09-24 | Discharge: 2016-09-24 | Disposition: A | Discharge status: Home / Self Care | Payer: Medicare Other | Source: Ambulatory Visit | Attending: Family Medicine | Admitting: Family Medicine

## 2016-09-24 DIAGNOSIS — Z1231 Encounter for screening mammogram for malignant neoplasm of breast: Secondary | ICD-10-CM | POA: Insufficient documentation

## 2017-04-11 ENCOUNTER — Other Ambulatory Visit: Payer: Self-pay | Admitting: Family Medicine

## 2017-04-11 DIAGNOSIS — Z1231 Encounter for screening mammogram for malignant neoplasm of breast: Secondary | ICD-10-CM

## 2017-04-30 ENCOUNTER — Other Ambulatory Visit: Payer: Self-pay | Admitting: Family Medicine

## 2017-04-30 DIAGNOSIS — R413 Other amnesia: Secondary | ICD-10-CM

## 2017-05-28 ENCOUNTER — Ambulatory Visit
Admission: RE | Admit: 2017-05-28 | Discharge: 2017-05-28 | Disposition: A | Discharge status: Home / Self Care | Payer: Medicare HMO | Source: Ambulatory Visit | Attending: Family Medicine | Admitting: Family Medicine

## 2017-05-28 DIAGNOSIS — G319 Degenerative disease of nervous system, unspecified: Secondary | ICD-10-CM | POA: Diagnosis not present

## 2017-05-28 DIAGNOSIS — R413 Other amnesia: Secondary | ICD-10-CM | POA: Diagnosis not present

## 2017-09-26 ENCOUNTER — Ambulatory Visit
Admission: RE | Admit: 2017-09-26 | Discharge: 2017-09-26 | Disposition: A | Discharge status: Home / Self Care | Payer: Medicare HMO | Source: Ambulatory Visit | Attending: Family Medicine | Admitting: Family Medicine

## 2017-09-26 DIAGNOSIS — Z1231 Encounter for screening mammogram for malignant neoplasm of breast: Secondary | ICD-10-CM | POA: Diagnosis present

## 2018-01-03 ENCOUNTER — Emergency Department
Admission: EM | Admit: 2018-01-03 | Discharge: 2018-01-03 | Disposition: A | Discharge status: Home / Self Care | Payer: Medicare HMO | Attending: Emergency Medicine | Admitting: Emergency Medicine

## 2018-01-03 ENCOUNTER — Emergency Department: Payer: Medicare HMO

## 2018-01-03 ENCOUNTER — Encounter: Payer: Self-pay | Admitting: Emergency Medicine

## 2018-01-03 DIAGNOSIS — Z79899 Other long term (current) drug therapy: Secondary | ICD-10-CM | POA: Diagnosis not present

## 2018-01-03 DIAGNOSIS — E119 Type 2 diabetes mellitus without complications: Secondary | ICD-10-CM | POA: Insufficient documentation

## 2018-01-03 DIAGNOSIS — M542 Cervicalgia: Secondary | ICD-10-CM | POA: Diagnosis not present

## 2018-01-03 DIAGNOSIS — I10 Essential (primary) hypertension: Secondary | ICD-10-CM | POA: Insufficient documentation

## 2018-01-03 MED ORDER — PREDNISONE 50 MG PO TABS: ORAL_TABLET | ORAL | 0 refills | Status: DC

## 2018-01-03 NOTE — ED Triage Notes (Signed)
Pt arrived with complaints of head and neck pain.Pt states "it feels like a catch." Pt states she woke up and the pain was present. Pt states the pain is only present with movement.

## 2018-01-03 NOTE — ED Provider Notes (Signed)
[facility] Emergency Department Provider Note  ____________________________________________  Time seen: Approximately 9:03 PM  I have reviewed the triage vital signs and the nursing notes.   HISTORY  Chief Complaint Neck Pain    HPI [name] [name] is a 72 y.o. female with a history of well-controlled diabetes, presents to the emergency department with 7 out of 10 aching and stiff neck pain.  Patient reports that she woke up to the pain today.  Pain is worse with range of motion at the neck and relieved with a still position.  Patient denies recent falls or traumas.  Patient currently walks with a cane and frequently falls, according to son.  Patient currently resides with her son.   Past Medical History:  Diagnosis Date  . Diabetes mellitus without complication ([place])   . Hypertension     There are no active problems to display for this patient.   History reviewed. No pertinent surgical history.  Prior to Admission medications   Medication Sig Start Date End Date Taking? Authorizing Provider  amLODipine (NORVASC) 10 MG tablet Take 1 tablet by mouth daily. 05/02/16   [provider]  atorvastatin (LIPITOR) 40 MG tablet Take 1 tablet by mouth daily. 05/02/16   [provider]  carvedilol (COREG) 25 MG tablet Take 1 tablet by mouth 2 (two) times daily. 05/02/16   [provider]  cephALEXin (KEFLEX) 500 MG capsule Take 1 capsule (500 mg total) by mouth 4 (four) times daily. 05/10/15   Eula Listen, MD  KOMBIGLYZE XR 08-998 MG TB24 Take 1 tablet by mouth 2 (two) times daily. 05/06/16   [provider]  meloxicam (MOBIC) 15 MG tablet Take 1 tablet (15 mg total) by mouth daily. 03/07/15   Beers, Pierce Crane, PA-C  ondansetron (ZOFRAN-ODT) 4 MG disintegrating tablet Take 1 tablet (4 mg total) by mouth every 8 (eight) hours as needed for nausea or vomiting. 05/10/15   Eula Listen, MD  predniSONE (DELTASONE) 50 MG  tablet Take one 50 mg tablet once daily for the next five days. 01/03/18   Lannie Fields, PA-C  quinapril (ACCUPRIL) 40 MG tablet Take 1 tablet by mouth 2 (two) times daily. 05/02/16   [provider]  TANZEUM 30 MG PEN Inject 1 Dose into the skin daily. 05/10/16   [provider]  traMADol (ULTRAM) 50 MG tablet Take 1 tablet (50 mg total) by mouth 2 (two) times daily. 09/14/16   Menshew, Dannielle Karvonen, PA-C    Allergies Patient has no known allergies.  Family History  Problem Relation Age of Onset  . Breast cancer Neg Hx     Social History Social History   Tobacco Use  . Smoking status: Never Smoker  . Smokeless tobacco: Never Used  Substance Use Topics  . Alcohol use: No  . Drug use: Not on file     Review of Systems  Constitutional: No fever/chills Eyes: No visual changes. No discharge ENT: No upper respiratory complaints. Cardiovascular: no chest pain. Respiratory: no cough. No SOB. Gastrointestinal: No abdominal pain.  No nausea, no vomiting.  No diarrhea.  No constipation. Genitourinary: Negative for dysuria. No hematuria Musculoskeletal: Patient has neck pain. Skin: Negative for rash, abrasions, lacerations, ecchymosis. Neurological: Negative for headaches, focal weakness or numbness.   ____________________________________________   PHYSICAL EXAM:  VITAL SIGNS: ED Triage Vitals  Enc Vitals Group     BP 01/03/18 1505 (!) 140/52     Pulse Rate 01/03/18 1505 72  Resp 01/03/18 1505 18     Temp 01/03/18 1505 98.9 F (37.2 C)     Temp Source 01/03/18 1505 Oral     SpO2 01/03/18 1505 98 %     Weight 01/03/18 1506 196 lb (88.9 kg)     Height 01/03/18 1506 5\' 7"  (1.702 m)     Head Circumference --      Peak Flow --      Pain Score 01/03/18 1506 9     Pain Loc --      Pain Edu? --      Excl. in [place]? --      Constitutional: Alert and oriented. Well appearing and in no acute distress. Eyes: Conjunctivae are normal. PERRL. EOMI. Head:  Atraumatic. Neck: Patient is able to perform limited range of motion at the neck.  She experiences pain with range of motion.  She has paraspinal muscle tenderness along the cervical spine but no midline C-spine tenderness. Patient has tinging of the bilateral upper extremities with ROM at the neck.  Cardiovascular: Normal rate, regular rhythm. Normal S1 and S2.  Good peripheral circulation. Respiratory: Normal respiratory effort without tachypnea or retractions. Lungs CTAB. Good air entry to the bases with no decreased or absent breath sounds. Musculoskeletal: Full range of motion to all extremities. No gross deformities appreciated. Neurologic:  Normal speech and language. No gross focal neurologic deficits are appreciated.  Skin:  Skin is warm, dry and intact. No rash noted. Psychiatric: Mood and affect are normal. Speech and behavior are normal. Patient exhibits appropriate insight and judgement.   ____________________________________________   LABS (all labs ordered are listed, but only abnormal results are displayed)  Labs Reviewed - No data to display ____________________________________________  EKG   ____________________________________________  RADIOLOGY I personally viewed and evaluated these images as part of my medical decision making, as well as reviewing the written report by the radiologist  Dg Cervical Spine 2-3 Views  Result Date: 01/03/2018 CLINICAL DATA:  Pt arrived with complaints of head and neck pain.Pt states "it feels like a catch." Pt states she woke up today and the pain was present. Pt states the pain is only present with movement. Pain is more left sided and extends over.*comment was truncated* EXAM: CERVICAL SPINE - 2-3 VIEW COMPARISON:  None. FINDINGS: No prevertebral soft tissue swelling. Normal alignment of the vertebral bodies. Normal spinal laminal line. Open mouth odontoid view demonstrates normal alignment of the lateral masses of C1 on C2. IMPRESSION:  Negative cervical spine radiographs. Electronically Signed   By: Suzy Bouchard M.D.   On: 01/03/2018 16:42    ____________________________________________    PROCEDURES  Procedure(s) performed:    Procedures    Medications - No data to display   ____________________________________________   INITIAL IMPRESSION / ASSESSMENT AND PLAN / ED COURSE  Pertinent labs & imaging results that were available during my care of the patient were reviewed by me and considered in my medical decision making (see chart for details).  Review of the [place] CSRS was performed in accordance of the [place] prior to dispensing any controlled drugs.      Assessment and Plan:  Neck Pain:  Patient presents to the emergency department with aching neck pain.  I am concerned about giving patient a muscle relaxer due to fall risk.  Patient was given a 5-day course of prednisone given well-controlled diabetes status.  Patient was advised to follow-up with primary care as needed.  All patient questions were answered.  ____________________________________________  FINAL CLINICAL IMPRESSION(S) / ED DIAGNOSES  Final diagnoses:  Neck pain      NEW MEDICATIONS STARTED DURING THIS VISIT:  ED Discharge Orders         Ordered    predniSONE (DELTASONE) 50 MG tablet     01/03/18 1731              This chart was dictated using voice recognition software/Dragon. Despite best efforts to proofread, errors can occur which can change the meaning. Any change was purely unintentional.    Karren Cobble 01/03/18 2110    Lavonia Drafts, MD 01/03/18 2155

## 2018-01-03 NOTE — ED Notes (Signed)
Pt  Reports she woke up with  Neck  Pain  Mainly on l side  Denies any specefic  inj denies any  Nausea or  Vomiting  Family member  At bedside

## 2018-02-23 ENCOUNTER — Ambulatory Visit: Payer: Medicare Other | Admitting: Physician Assistant

## 2018-03-02 ENCOUNTER — Encounter: Payer: Medicare HMO | Attending: Physician Assistant | Admitting: Physician Assistant

## 2018-03-02 DIAGNOSIS — E11610 Type 2 diabetes mellitus with diabetic neuropathic arthropathy: Secondary | ICD-10-CM | POA: Insufficient documentation

## 2018-03-02 DIAGNOSIS — L97512 Non-pressure chronic ulcer of other part of right foot with fat layer exposed: Secondary | ICD-10-CM | POA: Diagnosis not present

## 2018-03-02 DIAGNOSIS — I10 Essential (primary) hypertension: Secondary | ICD-10-CM | POA: Insufficient documentation

## 2018-03-02 DIAGNOSIS — Z79899 Other long term (current) drug therapy: Secondary | ICD-10-CM | POA: Insufficient documentation

## 2018-03-02 DIAGNOSIS — E11621 Type 2 diabetes mellitus with foot ulcer: Secondary | ICD-10-CM | POA: Insufficient documentation

## 2018-03-03 NOTE — Progress Notes (Signed)
[name] [name], [name] ([id number]) Visit Report for 03/02/2018 Abuse/Suicide Risk Screen Details Patient Name: [name] [name], [name] [name]. Date of Service: 03/02/2018 1:00 PM Medical Record Number: [id number] Patient Account Number: [id number] Date of Birth/Sex: 1945-10-09 (72 y.o. F) Treating RN: Secundino Ginger Primary Care Kelwin Gibler: Delight Stare Other Clinician: Referring Trachelle Low: CLINE, TODD Treating Radhika Dershem/Extender: STONE III, HOYT Weeks in Treatment: 0 Abuse/Suicide Risk Screen Items Answer ABUSE/SUICIDE RISK SCREEN: Has anyone close to you tried to hurt or harm you recentlyo No Do you feel uncomfortable with anyone in your familyo No Has anyone forced you do things that you didnot want to doo No Do you have any thoughts of harming yourselfo No Patient displays signs or symptoms of abuse and/or neglect. No Electronic Signature(s) Signed: 03/02/2018 4:17:43 PM By: Secundino Ginger Entered By: Secundino Ginger on 03/02/2018 13:32:55 [name] [name] ([id number]) -------------------------------------------------------------------------------- Activities of Daily Living Details Patient Name: [name] [name]. Date of Service: 03/02/2018 1:00 PM Medical Record Number: [id number] Patient Account Number: [id number] Date of Birth/Sex: 1945/05/06 (72 y.o. F) Treating RN: Secundino Ginger Primary Care Myrtis Maille: Delight Stare Other Clinician: Referring Cash Meadow: CLINE, TODD Treating Tiarra Anastacio/Extender: Melburn Hake, HOYT Weeks in Treatment: 0 Activities of Daily Living Items Answer Activities of Daily Living (Please select one for each item) Drive Automobile Need Assistance Take Medications Completely Able Use Telephone Completely Able Care for Appearance Need Assistance Use Toilet Completely Able Bath / Shower Need Assistance Dress Self Completely Able Feed Self Completely Able Walk Completely Able Get In / Out Bed Completely Able Housework Need Assistance Prepare Meals Need Assistance Handle Money Completely Able Shop  for Self Completely Able Electronic Signature(s) Signed: 03/02/2018 4:17:43 PM By: Secundino Ginger Entered By: Secundino Ginger on 03/02/2018 13:34:37 [name] [name] ([id number]) -------------------------------------------------------------------------------- Education Assessment Details Patient Name: [name] [name]. Date of Service: 03/02/2018 1:00 PM Medical Record Number: [id number] Patient Account Number: [id number] Date of Birth/Sex: Mar 07, 1946 (72 y.o. F) Treating RN: Secundino Ginger Primary Care Maciah Schweigert: Delight Stare Other Clinician: Referring Thurza Kwiecinski: CLINE, TODD Treating Honorio Devol/Extender: Melburn Hake, HOYT Weeks in Treatment: 0 Primary Learner Assessed: Caregiver Learning Preferences/Education Level/Primary Language Learning Preference: Explanation, Demonstration Highest Education Level: College or Above Preferred Language: English Cognitive Barrier Assessment/Beliefs Language Barrier: No Translator Needed: No Memory Deficit: No Emotional Barrier: No Physical Barrier Assessment Impaired Vision: No Impaired Hearing: No Decreased Hand dexterity: No Knowledge/Comprehension Assessment Knowledge Level: High Comprehension Level: High Ability to understand written High instructions: Ability to understand verbal High instructions: Motivation Assessment Anxiety Level: Calm Cooperation: Cooperative Education Importance: Acknowledges Need Interest in Health Problems: Asks Questions Perception: Coherent Willingness to Engage in Self- High Management Activities: Readiness to Engage in Self- High Management Activities: Electronic Signature(s) Signed: 03/02/2018 4:17:43 PM By: Secundino Ginger Entered By: Secundino Ginger on 03/02/2018 13:35:26 [name] [name] ([id number]) -------------------------------------------------------------------------------- Fall Risk Assessment Details Patient Name: [name] [name]. Date of Service: 03/02/2018 1:00 PM Medical Record Number: [id number] Patient Account  Number: [id number] Date of Birth/Sex: [birth year] (72 y.o. F) Treating RN: Secundino Ginger Primary Care Nadeen Shipman: Delight Stare Other Clinician: Referring Kalkidan Caudell: CLINE, TODD Treating Delpha Perko/Extender: Melburn Hake, HOYT Weeks in Treatment: 0 Fall Risk Assessment Items Have you had 2 or more falls in the last 12 monthso 0 No Have you had any fall that resulted in injury in the last 12 monthso 0 No FALL RISK ASSESSMENT: History of falling - immediate or within 3 months 0 No Secondary diagnosis 0 No Ambulatory aid None/bed rest/wheelchair/nurse 0 No Crutches/cane/walker 0 No Furniture 0 No IV Access/Saline Lock 0  No Gait/Training Normal/bed rest/immobile 0 No Weak 0 No Impaired 0 No Mental Status Oriented to own ability 0 No Electronic Signature(s) Signed: 03/02/2018 4:17:43 PM By: Secundino Ginger Entered By: Secundino Ginger on 03/02/2018 13:35:55 [name] [name] ([id number]) -------------------------------------------------------------------------------- Foot Assessment Details Patient Name: [name] [name]. Date of Service: 03/02/2018 1:00 PM Medical Record Number: [id number] Patient Account Number: [id number] Date of Birth/Sex: March 27, 1946 (73 y.o. F) Treating RN: Secundino Ginger Primary Care Mansa Willers: Delight Stare Other Clinician: Referring Theola Cuellar: CLINE, TODD Treating Aryonna Gunnerson/Extender: Melburn Hake, HOYT Weeks in Treatment: 0 Foot Assessment Items Site Locations + = Sensation present, - = Sensation absent, C = Callus, U = Ulcer R = Redness, W = Warmth, M = Maceration, PU = Pre-ulcerative lesion F = Fissure, S = Swelling, D = Dryness Assessment Right: Left: Other Deformity: No No Prior Foot Ulcer: No No Prior Amputation: No No Charcot Joint: No No Ambulatory Status: Gait: Electronic Signature(s) Signed: 03/02/2018 4:17:43 PM By: Secundino Ginger Entered By: Secundino Ginger on 03/02/2018 13:37:56 [name] [name]  ([id number]) -------------------------------------------------------------------------------- Nutrition Risk Assessment Details Patient Name: [name] [name]. Date of Service: 03/02/2018 1:00 PM Medical Record Number: [id number] Patient Account Number: [id number] Date of Birth/Sex: [birth year] (72 y.o. F) Treating RN: Secundino Ginger Primary Care Evi Mccomb: Delight Stare Other Clinician: Referring Ryka Beighley: CLINE, TODD Treating Faithlyn Recktenwald/Extender: Melburn Hake, HOYT Weeks in Treatment: 0 Height (in): 67 Weight (lbs): 200 Body Mass Index (BMI): 31.3 Nutrition Risk Assessment Items NUTRITION RISK SCREEN: I have an illness or condition that made me change the kind and/or amount of 0 No food I eat I eat fewer than two meals per day 0 No I eat few fruits and vegetables, or milk products 0 No I have three or more drinks of beer, liquor or wine almost every day 0 No I have tooth or mouth problems that make it hard for me to eat 0 No I don't always have enough money to buy the food I need 0 No I eat alone most of the time 0 No I take three or more different prescribed or over-the-counter drugs a day 0 No Without wanting to, I have lost or gained 10 pounds in the last six months 0 No I am not always physically able to shop, cook and/or feed myself 0 No Nutrition Protocols Good Risk Protocol Moderate Risk Protocol Electronic Signature(s) Signed: 03/02/2018 4:17:43 PM By: Secundino Ginger Entered By: Secundino Ginger on 03/02/2018 13:36:03

## 2018-03-05 NOTE — Progress Notes (Signed)
[name] [name], [name] ([id number]) Visit Report for 03/02/2018 Allergy List Details Patient Name: [name] [name], [name] [name]. Date of Service: 03/02/2018 1:00 PM Medical Record Number: [id number] Patient Account Number: [id number] Date of Birth/Sex: 1945-11-05 (72 y.o. F) Treating RN: Secundino Ginger Primary Care Javonni Macke: Delight Stare Other Clinician: Referring Corie Allis: CLINE, TODD Treating Aniyiah Zell/Extender: STONE III, HOYT Weeks in Treatment: 0 Allergies Active Allergies nkda Allergy Notes Electronic Signature(s) Signed: 03/02/2018 4:17:43 PM By: Secundino Ginger Entered By: Secundino Ginger on 03/02/2018 13:24:14 [name] [name] ([id number]) -------------------------------------------------------------------------------- [place] Information Details Patient Name: [name], [name]. Date of Service: 03/02/2018 1:00 PM Medical Record Number: [id number] Patient Account Number: [id number] Date of Birth/Sex: April 15, 1945 (72 y.o. F) Treating RN: Secundino Ginger Primary Care Juddson Cobern: Delight Stare Other Clinician: Referring Cyla Haluska: CLINE, TODD Treating [name]/Extender: Melburn Hake, HOYT Weeks in Treatment: 0 Visit Information Patient Arrived: Cane Arrival Time: 13:01 Accompanied By: son Transfer Assistance: None Patient Identification Verified: Yes Secondary Verification Process Completed: Yes Electronic Signature(s) Signed: 03/02/2018 4:17:43 PM By: Secundino Ginger Entered By: Secundino Ginger on 03/02/2018 13:08:16 [name] [name] ([id number]) -------------------------------------------------------------------------------- Clinic Level of Care Assessment Details Patient Name: [name] [name]. Date of Service: 03/02/2018 1:00 PM Medical Record Number: [id number] Patient Account Number: [id number] Date of Birth/Sex: [birth year] (72 y.o. F) Treating RN: Harold Barban Primary Care Mathius Birkeland: Delight Stare Other Clinician: Referring Hymie Gorr: CLINE, TODD Treating Adriannah Steinkamp/Extender: Melburn Hake, HOYT Weeks in Treatment: 0 Clinic Level  of Care Assessment Items TOOL 2 Quantity Score []  - Use when only an EandM is performed on the INITIAL visit 0 ASSESSMENTS - Nursing Assessment / Reassessment X - General Physical Exam (combine w/ comprehensive assessment (listed just below) when 1 20 performed on new pt. evals) X- 1 25 Comprehensive Assessment (HX, ROS, Risk Assessments, Wounds Hx, etc.) ASSESSMENTS - Wound and Skin Assessment / Reassessment X - Simple Wound Assessment / Reassessment - one wound 1 5 []  - 0 Complex Wound Assessment / Reassessment - multiple wounds []  - 0 Dermatologic / Skin Assessment (not related to wound area) ASSESSMENTS - Ostomy and/or Continence Assessment and Care []  - Incontinence Assessment and Management 0 []  - 0 Ostomy Care Assessment and Management (repouching, etc.) PROCESS - Coordination of Care X - Simple Patient / Family Education for ongoing care 1 15 []  - 0 Complex (extensive) Patient / Family Education for ongoing care X- 1 10 Staff obtains Programmer, systems, Records, Test Results / Process Orders []  - 0 Staff telephones HHA, Nursing Homes / Clarify orders / etc []  - 0 Routine Transfer to another Facility (non-emergent condition) []  - 0 Routine Hospital Admission (non-emergent condition) []  - 0 New Admissions / Biomedical engineer / Ordering NPWT, Apligraf, etc. []  - 0 Emergency Hospital Admission (emergent condition) X- 1 10 Simple Discharge Coordination []  - 0 Complex (extensive) Discharge Coordination PROCESS - Special Needs []  - Pediatric / Minor Patient Management 0 []  - 0 Isolation Patient Management MARISEL, TOSTENSON ([id number]) []  - 0 Hearing / Language / Visual special needs []  - 0 Assessment of Community assistance (transportation, D/C planning, etc.) []  - 0 Additional assistance / Altered mentation []  - 0 Support Surface(s) Assessment (bed, cushion, seat, etc.) INTERVENTIONS - Wound Cleansing / Measurement X - Wound Imaging (photographs - any number of  wounds) 1 5 []  - 0 Wound Tracing (instead of photographs) X- 1 5 Simple Wound Measurement - one wound []  - 0 Complex Wound Measurement - multiple wounds X- 1 5 Simple Wound Cleansing - one wound []  - 0 Complex Wound Cleansing -  multiple wounds INTERVENTIONS - Wound Dressings X - Small Wound Dressing one or multiple wounds 1 10 []  - 0 Medium Wound Dressing one or multiple wounds []  - 0 Large Wound Dressing one or multiple wounds []  - 0 Application of Medications - injection INTERVENTIONS - Miscellaneous []  - External ear exam 0 []  - 0 Specimen Collection (cultures, biopsies, blood, body fluids, etc.) []  - 0 Specimen(s) / Culture(s) sent or taken to Lab for analysis []  - 0 Patient Transfer (multiple staff / Harrel Lemon Lift / Similar devices) []  - 0 Simple Staple / Suture removal (25 or less) []  - 0 Complex Staple / Suture removal (26 or more) []  - 0 Hypo / Hyperglycemic Management (close monitor of Blood Glucose) X- 1 15 Ankle / Brachial Index (ABI) - do not check if billed separately Has the patient been seen at the hospital within the last three years: Yes Total Score: 125 Level Of Care: ____ Electronic Signature(s) Signed: 03/04/2018 3:52:14 PM By: Harold Barban Entered By: Harold Barban on 03/03/2018 09:14:25 [name] [name] ([id number]) -------------------------------------------------------------------------------- Lower Extremity Assessment Details Patient Name: [name] [name]. Date of Service: 03/02/2018 1:00 PM Medical Record Number: [id number] Patient Account Number: [id number] Date of Birth/Sex: 1946/01/01 (72 y.o. F) Treating RN: Secundino Ginger Primary Care Alyanna Stoermer: Delight Stare Other Clinician: Referring Aven Christen: CLINE, TODD Treating Fatema Rabe/Extender: Melburn Hake, HOYT Weeks in Treatment: 0 Edema Assessment Assessed: [Left: No] [Right: No] [Left: Edema] [Right: :] Calf Left: Right: Point of Measurement: 32 cm From Medial Instep 32 cm 33 cm Ankle Left:  Right: Point of Measurement: 12 cm From Medial Instep 19 cm 20 cm Vascular Assessment Claudication: Claudication Assessment [Left:None] [Right:None] Pulses: Dorsalis Pedis Palpable: [Left:Yes] [Right:Yes] Posterior Tibial Extremity colors, hair growth, and conditions: Extremity Color: [Left:Normal] [Right:Normal] Hair Growth on Extremity: [Left:No] [Right:No] Temperature of Extremity: [Left:Warm] [Right:Warm] Capillary Refill: [Left:< 3 seconds] [Right:< 3 seconds] Blood Pressure: Brachial: [Left:130] Dorsalis Pedis: 130 [Left:Dorsalis Pedis: 130] Ankle: Posterior Tibial: 80 [Left:Posterior Tibial: 130 1.00] [Right:1.00] Toe Nail Assessment Left: Right: Thick: Yes Yes Discolored: No No Deformed: No No Improper Length and Hygiene: No No Electronic Signature(s) Signed: 03/02/2018 4:17:43 PM By: Secundino Ginger Entered By: Secundino Ginger on 03/02/2018 13:49:16 [name] [name] ([id number]) -------------------------------------------------------------------------------- Multi Wound Chart Details Patient Name: [name] [name]. Date of Service: 03/02/2018 1:00 PM Medical Record Number: [id number] Patient Account Number: [id number] Date of Birth/Sex: 08-26-1945 (72 y.o. F) Treating RN: Harold Barban Primary Care Terica Yogi: Delight Stare Other Clinician: Referring Tawan Degroote: CLINE, TODD Treating Karo Rog/Extender: STONE III, HOYT Weeks in Treatment: 0 Vital Signs Height(in): 67 Pulse(bpm): 62 Weight(lbs): 200 Blood Pressure(mmHg): 153/73 Body Mass Index(BMI): 31 Temperature(F): 98.6 Respiratory Rate 18 (breaths/min): Photos: [1:No Photos] [N/A:N/A] Wound Location: [1:Right Foot - Plantar] [N/A:N/A] Wounding Event: [1:Gradually Appeared] [N/A:N/A] Primary Etiology: [1:To be determined] [N/A:N/A] Date Acquired: [1:06/16/2017] [N/A:N/A] Weeks of Treatment: [1:0] [N/A:N/A] Wound Status: [1:Open] [N/A:N/A] Measurements L x W x D [1:2x1.4x0.4] [N/A:N/A] (cm) Area (cm) : [1:2.199]  [N/A:N/A] Volume (cm) : [1:0.88] [N/A:N/A] Classification: [1:Full Thickness Without Exposed Support Structures] [N/A:N/A] Exudate Amount: [1:Medium] [N/A:N/A] Exudate Type: [1:Serosanguineous] [N/A:N/A] Exudate Color: [1:red, Boike] [N/A:N/A] Granulation Amount: [1:None Present (0%)] [N/A:N/A] Necrotic Amount: [1:Medium (34-66%)] [N/A:N/A] Necrotic Tissue: [1:Eschar, Adherent Slough] [N/A:N/A] Exposed Structures: [1:Fat Layer (Subcutaneous Tissue) Exposed: Yes Fascia: No Tendon: No Muscle: No Joint: No Bone: No] [N/A:N/A] Periwound Skin Texture: [1:Excoriation: No Induration: No Callus: No Crepitus: No Rash: No Scarring: No] [N/A:N/A] Periwound Skin Moisture: [1:Maceration: Yes Dry/Scaly: No] [N/A:N/A] Periwound Skin Color: [1:Atrophie Blanche: No Cyanosis:  No Ecchymosis: No] [N/A:N/A] Erythema: No Hemosiderin Staining: No Mottled: No Pallor: No Rubor: No Tenderness on Palpation: No N/A N/A Wound Preparation: Ulcer Cleansing: N/A N/A Rinsed/Irrigated with Saline Topical Anesthetic Applied: Other: lidocaine 4% Treatment Notes Electronic Signature(s) Signed: 03/04/2018 3:52:14 PM By: Harold Barban Entered By: Harold Barban on 03/02/2018 13:59:19 [name] [name] ([id number]) -------------------------------------------------------------------------------- [place] Details Patient Name: DELOMA, SPINDLE. Date of Service: 03/02/2018 1:00 PM Medical Record Number: [id number] Patient Account Number: [id number] Date of Birth/Sex: Mar 27, 1946 (72 y.o. F) Treating RN: Secundino Ginger Primary Care Arionne Iams: Delight Stare Other Clinician: Referring Cendy Oconnor: CLINE, TODD Treating Chalee Hirota/Extender: Melburn Hake, HOYT Weeks in Treatment: 0 Active Inactive Pressure Nursing Diagnoses: Knowledge deficit related to causes and risk factors for pressure ulcer development Potential for impaired tissue integrity related to pressure, friction, moisture, and shear Goals: Patient  will remain free from development of additional pressure ulcers Date Initiated: 03/02/2018 Target Resolution Date: 05/02/2018 Goal Status: Active Patient/caregiver will verbalize risk factors for pressure ulcer development Date Initiated: 03/02/2018 Target Resolution Date: 04/01/2018 Goal Status: Active Interventions: Assess: immobility, friction, shearing, incontinence upon admission and as needed Assess offloading mechanisms upon admission and as needed Provide education on pressure ulcers Notes: Wound/Skin Impairment Nursing Diagnoses: Impaired tissue integrity Knowledge deficit related to ulceration/compromised skin integrity Goals: Ulcer/skin breakdown will have a volume reduction of 30% by week 4 Date Initiated: 03/02/2018 Target Resolution Date: 04/01/2018 Goal Status: Active Interventions: Assess patient/caregiver ability to obtain necessary supplies Assess patient/caregiver ability to perform ulcer/skin care regimen upon admission and as needed Assess ulceration(s) every visit Provide education on ulcer and skin care Notes: LYNDELL, GILLYARD ([id number]) Electronic Signature(s) Signed: 03/02/2018 4:17:43 PM By: Secundino Ginger Signed: 03/04/2018 3:52:14 PM By: Harold Barban Entered By: Harold Barban on 03/02/2018 13:59:00 [name] [name] ([id number]) -------------------------------------------------------------------------------- Pain Assessment Details Patient Name: [name] [name]. Date of Service: 03/02/2018 1:00 PM Medical Record Number: [id number] Patient Account Number: [id number] Date of Birth/Sex: 1945/09/28 (72 y.o. F) Treating RN: Secundino Ginger Primary Care Annelies Coyt: Delight Stare Other Clinician: Referring Jameire Kouba: CLINE, TODD Treating Shyvonne Chastang/Extender: Melburn Hake, HOYT Weeks in Treatment: 0 Active Problems Location of Pain Severity and Description of Pain Patient Has Paino No Site Locations Pain Management and Medication Current Pain Management: Goals for  Pain Management pt denies any pain at this time. Electronic Signature(s) Signed: 03/02/2018 4:17:43 PM By: Secundino Ginger Entered By: Secundino Ginger on 03/02/2018 13:09:00 [name] [name] ([id number]) -------------------------------------------------------------------------------- Patient/Caregiver Education Details Patient Name: LATAMARA, MELDER. Date of Service: 03/02/2018 1:00 PM Medical Record Number: [id number] Patient Account Number: [id number] Date of Birth/Gender: 06-21-1945 (72 y.o. F) Treating RN: Harold Barban Primary Care Physician: Delight Stare Other Clinician: Referring Physician: CLINE, TODD Treating Physician/Extender: Sharalyn Ink in Treatment: 0 Education Assessment Education Provided To: Patient Education Topics Provided Pressure: Handouts: Pressure Ulcers: Care and Offloading Methods: Demonstration, Explain/Verbal Responses: State content correctly Wound/Skin Impairment: Handouts: Caring for Your Ulcer Methods: Demonstration, Explain/Verbal Responses: State content correctly Electronic Signature(s) Signed: 03/04/2018 3:52:14 PM By: Harold Barban Entered By: Harold Barban on 03/02/2018 14:12:04 [name] [name] ([id number]) -------------------------------------------------------------------------------- Wound Assessment Details Patient Name: [name] [name]. Date of Service: 03/02/2018 1:00 PM Medical Record Number: [id number] Patient Account Number: [id number] Date of Birth/Sex: 04-14-1945 (72 y.o. F) Treating RN: Secundino Ginger Primary Care Joellyn Grandt: Delight Stare Other Clinician: Referring Haivyn Oravec: CLINE, TODD Treating Hermine Feria/Extender: STONE III, HOYT Weeks in Treatment: 0 Wound Status Wound Number: 1 Primary Etiology: To be determined Wound Location: Right Foot - Plantar Wound  Status: Open Wounding Event: Gradually Appeared Date Acquired: 06/16/2017 Weeks Of Treatment: 0 Clustered Wound: No Photos Photo Uploaded By: Secundino Ginger on 03/02/2018  15:48:58 Wound Measurements Length: (cm) 2 Width: (cm) 1.4 Depth: (cm) 0.4 Area: (cm) 2.199 Volume: (cm) 0.88 % Reduction in Area: % Reduction in Volume: Tunneling: No Undermining: No Wound Description Full Thickness Without Exposed Support Classification: Structures Exudate Medium Amount: Exudate Type: Serosanguineous Exudate Color: red, Lant Wound Bed Granulation Amount: None Present (0%) Exposed Structure Necrotic Amount: Medium (34-66%) Fascia Exposed: No Necrotic Quality: Eschar, Adherent Slough Fat Layer (Subcutaneous Tissue) Exposed: Yes Tendon Exposed: No Muscle Exposed: No Joint Exposed: No Bone Exposed: No Periwound Skin Texture AHMIYAH, COIL. ([id number]) Texture Color No Abnormalities Noted: No No Abnormalities Noted: No Callus: No Atrophie Blanche: No Crepitus: No Cyanosis: No Excoriation: No Ecchymosis: No Induration: No Erythema: No Rash: No Hemosiderin Staining: No Scarring: No Mottled: No Pallor: No Moisture Rubor: No No Abnormalities Noted: No Dry / Scaly: No Maceration: Yes Wound Preparation Ulcer Cleansing: Rinsed/Irrigated with Saline Topical Anesthetic Applied: Other: lidocaine 4%, Electronic Signature(s) Signed: 03/02/2018 4:17:43 PM By: Secundino Ginger Entered By: Secundino Ginger on 03/02/2018 13:20:43 [name] [name] ([id number]) -------------------------------------------------------------------------------- [place] Details Patient Name: [name] [name]. Date of Service: 03/02/2018 1:00 PM Medical Record Number: [id number] Patient Account Number: [id number] Date of Birth/Sex: 10-16-1945 (72 y.o. F) Treating RN: Secundino Ginger Primary Care Crestina Strike: Delight Stare Other Clinician: Referring Edwina Grossberg: CLINE, TODD Treating Tura Roller/Extender: STONE III, HOYT Weeks in Treatment: 0 Vital Signs Time Taken: 13:09 Temperature (F): 98.6 Height (in): 67 Pulse (bpm): 62 Source: Stated Respiratory Rate (breaths/min): 18 Weight (lbs):  200 Blood Pressure (mmHg): 153/73 Source: Stated Reference Range: 80 - 120 mg / dl Body Mass Index (BMI): 31.3 Electronic Signature(s) Signed: 03/02/2018 4:17:43 PM By: Secundino Ginger Entered BySecundino Ginger on 03/02/2018 13:09:58

## 2018-03-05 NOTE — Progress Notes (Signed)
CIELLE, AGUILA ([id number]) Visit Report for 03/02/2018 Chief Complaint Document Details Patient Name: [name] [name], [name] [name]. Date of Service: 03/02/2018 1:00 PM Medical Record Number: [id number] Patient Account Number: [id number] Date of Birth/Sex: May 26, 1945 (72 y.o. F) Treating RN: Harold Barban Primary Care Provider: Delight Stare Other Clinician: Referring Provider: CLINE, TODD Treating Provider/Extender: Melburn Hake, HOYT Weeks in Treatment: 0 Information Obtained from: Patient Chief Complaint Right foot ulcer Electronic Signature(s) Signed: 03/02/2018 5:22:53 PM By: Worthy Keeler PA-C Entered By: Worthy Keeler on 03/02/2018 13:55:29 [name] [name] ([id number]) -------------------------------------------------------------------------------- Debridement Details Patient Name: [name] [name]. Date of Service: 03/02/2018 1:00 PM Medical Record Number: [id number] Patient Account Number: [id number] Date of Birth/Sex: [birth year] (72 y.o. F) Treating RN: Harold Barban Primary Care Provider: Delight Stare Other Clinician: Referring Provider: CLINE, TODD Treating Provider/Extender: Melburn Hake, HOYT Weeks in Treatment: 0 Debridement Performed for Wound #1 Right,Plantar Foot Assessment: Performed By: Physician STONE III, HOYT E., PA-C Debridement Type: Debridement Level of Consciousness (Pre- Awake and Alert procedure): Pre-procedure Verification/Time Yes - 14:02 Out Taken: Start Time: 14:02 Pain Control: Lidocaine 4% Topical Solution Total Area Debrided (L x W): 2 (cm) x 1.4 (cm) = 2.8 (cm) Tissue and other material Callus, Slough, Subcutaneous, Slough debrided: Level: Skin/Subcutaneous Tissue Debridement Description: Excisional Instrument: Curette Bleeding: Minimum Hemostasis Achieved: Pressure End Time: 14:09 Procedural Pain: 0 Post Procedural Pain: 0 Response to Treatment: Procedure was tolerated well Level of Consciousness Awake and Alert (Post-procedure): Post  Debridement Measurements of Total Wound Length: (cm) 2 Width: (cm) 1.4 Depth: (cm) 0.5 Volume: (cm) 1.1 Character of Wound/Ulcer Post Debridement: Improved Post Procedure Diagnosis Same as Pre-procedure Electronic Signature(s) Signed: 03/02/2018 5:22:53 PM By: Worthy Keeler PA-C Signed: 03/04/2018 3:52:14 PM By: Harold Barban Entered By: Harold Barban on 03/02/2018 14:07:59 [name] [name] ([id number]) -------------------------------------------------------------------------------- HPI Details Patient Name: [name] [name]. Date of Service: 03/02/2018 1:00 PM Medical Record Number: [id number] Patient Account Number: [id number] Date of Birth/Sex: 1945-05-18 (72 y.o. F) Treating RN: Harold Barban Primary Care Provider: Delight Stare Other Clinician: Referring Provider: CLINE, TODD Treating Provider/Extender: Melburn Hake, HOYT Weeks in Treatment: 0 History of Present Illness HPI Description: 03/02/18 on evaluation today patient presents for initial evaluation and clinic today as a referral from Dr. Sharlotte Alamo who was with [facility]. 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 This is something that he has been managing including the the debridement's which the patient tells me he has been doing during her office visits. She does have a history of diabetes mellitus type II. The dressing of choice as indicated by Dr. Cleda Mccreedy was Bactroban ointment and a sterile bandage. X-rays were deferred at that point as Dr. Cleda Mccreedy did not feel any additional x-rays were necessary. He stated point blank in her note that with the chronic malalignment of the right foot if anything gets deeper and there is potentially bone infection that her only viable option would be a below the knee amputation. Therefore she was patient has finished to see if there's anything we can do to help. A total contact cast has not been attempted by Dr. Cleda Mccreedy. The last office  note that we have from him is actually from 02/12/18. The patient has no significant pain in this region which is good news. No fevers, chills, nausea, or vomiting noted at this time. She does have a history of hypertension. Electronic Signature(s) Signed: 03/02/2018 5:22:53 PM By: Worthy Keeler PA-C Entered By:  Worthy Keeler on 03/02/2018 17:19:19 [name] [name], [name] [name] ([id number]) -------------------------------------------------------------------------------- Physical Exam Details Patient Name: [name] [name], [name] [name]. Date of Service: 03/02/2018 1:00 PM Medical Record Number: [id number] Patient Account Number: [id number] Date of Birth/Sex: Sep 14, 1945 (72 y.o. F) Treating RN: Harold Barban Primary Care Provider: Delight Stare Other Clinician: Referring Provider: CLINE, TODD Treating Provider/Extender: STONE III, HOYT Weeks in Treatment: 0 Constitutional patient is hypertensive.. pulse regular and within target range for patient.Marland Kitchen respirations regular, non-labored and within target range for patient.Marland Kitchen temperature within target range for patient.. Well-nourished and well-hydrated in no acute distress. Eyes conjunctiva clear no eyelid edema noted. pupils equal round and reactive to light and accommodation. Ears, Nose, Mouth, and Throat no gross abnormality of ear auricles or external auditory canals. normal hearing noted during conversation. mucus membranes moist. Respiratory normal breathing without difficulty. clear to auscultation bilaterally. Cardiovascular regular rate and rhythm with normal S1, S2. 1+ dorsalis pedis/posterior tibialis pulses. no clubbing, cyanosis, significant edema, <3 sec cap refill. Gastrointestinal (GI) soft, non-tender, non-distended, +BS. no ventral hernia noted. Musculoskeletal Patient unable to walk without assistance. Patient has significant right foot Charcot arthropathy. Psychiatric this patient is able to make decisions and demonstrates good insight into  disease process. Alert and Oriented x 3. pleasant and cooperative. Notes Upon inspection today the patient's wound bed actually shows evidence of some Slough noted on the surface of the wound. There did not appear to be any bone exposure which is good news. She did have significant callous buildup surrounding the wound and that was definitely maceration. Fortunately again there appears to be no sign of infection. The patient does emulate with a cane currently. She does have significant Charcot arthropathy of the right foot. Post debridement the wound bed appears to be doing significantly better which is excellent news. Overall I do think she could potentially benefit from a total contact cast. I think she may also need a custom diabetic shoes for this right foot. A traditional shoe is not going to fit her appropriately. Electronic Signature(s) Signed: 03/02/2018 5:22:53 PM By: Worthy Keeler PA-C Entered By: Worthy Keeler on 03/02/2018 17:21:03 [name] [name] ([id number]) -------------------------------------------------------------------------------- Physician Orders Details Patient Name: [name] [name], [name] [name]. Date of Service: 03/02/2018 1:00 PM Medical Record Number: [id number] Patient Account Number: [id number] Date of Birth/Sex: Jul 31, 1945 (72 y.o. F) Treating RN: Harold Barban Primary Care Provider: Delight Stare Other Clinician: Referring Provider: CLINE, TODD Treating Provider/Extender: Melburn Hake, HOYT Weeks in Treatment: 0 Verbal / Phone Orders: No Diagnosis Coding ICD-10 Coding Code Description E11.621 Type 2 diabetes mellitus with foot ulcer L97.512 Non-pressure chronic ulcer of other part of right foot with fat layer exposed A52.16 Charcot's arthropathy (tabetic) I10 Essential (primary) hypertension Primary Wound Dressing Wound #1 Right,Plantar Foot o Silver Alginate Secondary Dressing Wound #1 Right,Plantar Foot o Gauze and Kerlix/Conform - Felt callous  pad Dressing Change Frequency Wound #1 Right,Plantar Foot o Change Dressing Monday, Wednesday, Friday Follow-up Appointments Wound #1 Right,Plantar Foot o Return Appointment in 1 week. Off-Loading Wound #1 Right,Plantar Foot o Open toe surgical shoe to: Home Health Wound #1 [place] Nurse may visit PRN to address patientos wound care needs. o FACE TO FACE ENCOUNTER: MEDICARE and MEDICAID PATIENTS: I certify that this patient is under my care and that I had a face-to-face encounter that meets the physician face-to-face encounter requirements with this patient on this date. The encounter with the patient was in whole or in part for the following  MEDICAL CONDITION: (primary reason for Home Healthcare) MEDICAL NECESSITY: I certify, that based on my findings, NURSING services are a medically necessary home health service. HOME BOUND STATUS: I certify that my clinical findings support that this patient is homebound (i.e., Due to illness or injury, pt requires aid of supportive devices such as crutches, cane, wheelchairs, walkers, the use of special transportation or the assistance of another person to leave their place of residence. There is a normal inability to leave the home and doing so requires considerable and taxing effort. Other absences are for medical reasons / religious services and are infrequent or of short duration when for other reasons). [name] [name], [name] [name] ([id number]) o If current dressing causes regression in wound condition, may D/C ordered dressing product/s and apply Normal Saline Moist Dressing daily until next [place] / Other MD appointment. [place] of regression in wound condition at [phone]. o Please direct any NON-WOUND related issues/requests for orders to patient's Primary Care Physician Electronic Signature(s) Signed: 03/02/2018 5:22:53 PM By: Worthy Keeler  PA-C Signed: 03/04/2018 3:52:14 PM By: Harold Barban Entered By: Harold Barban on 03/02/2018 14:11:34 [name] [name] ([id number]) -------------------------------------------------------------------------------- Problem List Details Patient Name: [name] [name], [name] [name]. Date of Service: 03/02/2018 1:00 PM Medical Record Number: [id number] Patient Account Number: [id number] Date of Birth/Sex: March 09, 1946 (72 y.o. F) Treating RN: Harold Barban Primary Care Provider: Delight Stare Other Clinician: Referring Provider: CLINE, TODD Treating Provider/Extender: Melburn Hake, HOYT Weeks in Treatment: 0 Active Problems ICD-10 Evaluated Encounter Code Description Active Date Today Diagnosis E11.621 Type 2 diabetes mellitus with foot ulcer 03/02/2018 No Yes L97.512 Non-pressure chronic ulcer of other part of right foot with fat 03/02/2018 No Yes layer exposed A52.16 Charcot's arthropathy (tabetic) 03/02/2018 No Yes I10 Essential (primary) hypertension 03/02/2018 No Yes Inactive Problems Resolved Problems Electronic Signature(s) Signed: 03/02/2018 5:22:53 PM By: Worthy Keeler PA-C Entered By: Worthy Keeler on 03/02/2018 13:54:53 [name] [name] ([id number]) -------------------------------------------------------------------------------- Progress Note Details Patient Name: [name] [name]. Date of Service: 03/02/2018 1:00 PM Medical Record Number: [id number] Patient Account Number: [id number] Date of Birth/Sex: Oct 15, 1945 (72 y.o. F) Treating RN: Harold Barban Primary Care Provider: Delight Stare Other Clinician: Referring Provider: CLINE, TODD Treating Provider/Extender: Melburn Hake, HOYT Weeks in Treatment: 0 Subjective Chief Complaint Information obtained from Patient Right foot ulcer History of Present Illness (HPI) 03/02/18 on evaluation today patient presents for initial evaluation and clinic today as a referral from Dr. Sharlotte Alamo who was with [facility]. She has a wound on her  mid foot of the right foot where she has Charcot arthropathy. This is something that he has been managing including the the debridement's which the patient tells me he has been doing during her office visits. She does have a history of diabetes mellitus type II. The dressing of choice as indicated by Dr. Cleda Mccreedy was Bactroban ointment and a sterile bandage. X-rays were deferred at that point as Dr. Cleda Mccreedy did not feel any additional x-rays were necessary. He stated point blank in her note that with the chronic malalignment of the right foot if anything gets deeper and there is potentially bone infection that her only viable option would be a below the knee amputation. Therefore she was patient has finished to see if there's anything we can do to help. A total contact cast has not been attempted by Dr. Cleda Mccreedy. The last office note that we have from him is actually from 02/12/18. The patient has no significant pain in this  region which is good news. No fevers, chills, nausea, or vomiting noted at this time. She does have a history of hypertension. Wound History Patient reportedly has not tested positive for osteomyelitis. Patient reportedly has not had testing performed to evaluate circulation in the legs. Patient History Information obtained from Patient, son. Allergies nkda Family History Diabetes - Mother,Father, No family history of Cancer, Heart Disease, Hereditary Spherocytosis, Hypertension, Kidney Disease, Lung Disease, Seizures, Stroke, Thyroid Problems, Tuberculosis. Social History Never smoker, Alcohol Use - Never, Drug Use - No History, Caffeine Use - Never. Medical History Eyes Patient has history of Cataracts - od Denies history of Glaucoma, Optic Neuritis Ear/Nose/Mouth/Throat Patient has history of Chronic sinus problems/congestion Denies history of Middle ear problems Hematologic/Lymphatic Denies history of Anemia, Hemophilia, Human Immunodeficiency Virus, Lymphedema, Sickle  Cell Disease Respiratory ZENITA, KISTER ([id number]) Denies history of Aspiration, Asthma, Chronic Obstructive Pulmonary Disease (COPD), Pneumothorax, Sleep Apnea, Tuberculosis Cardiovascular Patient has history of Hypertension Denies history of Angina, Arrhythmia, Congestive Heart Failure, Coronary Artery Disease, Deep Vein Thrombosis, Hypotension, Myocardial Infarction, Peripheral Arterial Disease, Peripheral Venous Disease, Phlebitis, Vasculitis Gastrointestinal Denies history of Cirrhosis , Colitis, Crohn s, Hepatitis A, Hepatitis B, Hepatitis C Endocrine Patient has history of Type II Diabetes Denies history of Type I Diabetes Genitourinary Denies history of End Stage Renal Disease Immunological Denies history of Lupus Erythematosus, Raynaud s, Scleroderma Integumentary (Skin) Denies history of History of Burn, History of pressure wounds Musculoskeletal Denies history of Gout, Rheumatoid Arthritis, Osteoarthritis, Osteomyelitis Neurologic Denies history of Dementia, Neuropathy, Quadriplegia, Paraplegia, Seizure Disorder Oncologic Denies history of Received Chemotherapy, Received Radiation Psychiatric Denies history of Anorexia/bulimia, Confinement Anxiety Patient is treated with Controlled Diet, Oral Agents. Blood sugar is tested. Review of Systems (ROS) Eyes Complains or has symptoms of Dry Eyes, Glasses / Contacts - glasses. Denies complaints or symptoms of Vision Changes. Hematologic/Lymphatic Denies complaints or symptoms of Bleeding / Clotting Disorders, Human Immunodeficiency Virus. Respiratory Complains or has symptoms of Shortness of Breath. Denies complaints or symptoms of Chronic or frequent coughs. Cardiovascular Denies complaints or symptoms of Chest pain, LE edema. Gastrointestinal Complains or has symptoms of Frequent diarrhea. Denies complaints or symptoms of Nausea, Vomiting. Endocrine Denies complaints or symptoms of Hepatitis, Thyroid disease,  Polydypsia (Excessive Thirst). Genitourinary Denies complaints or symptoms of Kidney failure/ Dialysis, Incontinence/dribbling. Immunological Denies complaints or symptoms of Hives, Itching. Integumentary (Skin) Denies complaints or symptoms of Wounds, Bleeding or bruising tendency, Breakdown, Swelling. Musculoskeletal Denies complaints or symptoms of Muscle Pain, Muscle Weakness. Neurologic Denies complaints or symptoms of Numbness/parasthesias, Focal/Weakness. Psychiatric Denies complaints or symptoms of Anxiety, Claustrophobia. [name] [name], [name] [name] ([id number]) Objective Constitutional patient is hypertensive.. pulse regular and within target range for patient.Marland Kitchen respirations regular, non-labored and within target range for patient.Marland Kitchen temperature within target range for patient.. Well-nourished and well-hydrated in no acute distress. Vitals Time Taken: 1:09 PM, Height: 67 in, Source: Stated, Weight: 200 lbs, Source: Stated, BMI: 31.3, Temperature: 98.6 F, Pulse: 62 bpm, Respiratory Rate: 18 breaths/min, Blood Pressure: 153/73 mmHg. Eyes conjunctiva clear no eyelid edema noted. pupils equal round and reactive to light and accommodation. Ears, Nose, Mouth, and Throat no gross abnormality of ear auricles or external auditory canals. normal hearing noted during conversation. mucus membranes moist. Respiratory normal breathing without difficulty. clear to auscultation bilaterally. Cardiovascular regular rate and rhythm with normal S1, S2. 1+ dorsalis pedis/posterior tibialis pulses. no clubbing, cyanosis, significant edema, Gastrointestinal (GI) soft, non-tender, non-distended, +BS. no ventral hernia noted. Musculoskeletal Patient unable to walk without assistance. Patient has  significant right foot Charcot arthropathy. Psychiatric this patient is able to make decisions and demonstrates good insight into disease process. Alert and Oriented x 3. pleasant and cooperative. General Notes:  Upon inspection today the patient's wound bed actually shows evidence of some Slough noted on the surface of the wound. There did not appear to be any bone exposure which is good news. She did have significant callous buildup surrounding the wound and that was definitely maceration. Fortunately again there appears to be no sign of infection. The patient does emulate with a cane currently. She does have significant Charcot arthropathy of the right foot. Post debridement the wound bed appears to be doing significantly better which is excellent news. Overall I do think she could potentially benefit from a total contact cast. I think she may also need a custom diabetic shoes for this right foot. A traditional shoe is not going to fit her appropriately. Integumentary (Hair, Skin) Wound #1 status is Open. Original cause of wound was Gradually Appeared. The wound is located on the [place]. The wound measures 2cm length x 1.4cm width x 0.4cm depth; 2.199cm^2 area and 0.88cm^3 volume. There is Fat Layer (Subcutaneous Tissue) Exposed exposed. There is no tunneling or undermining noted. There is a medium amount of serosanguineous drainage noted. There is no granulation within the wound bed. There is a medium (34-66%) amount of necrotic tissue within the wound bed including Eschar and Adherent Slough. The periwound skin appearance exhibited: Maceration. The periwound skin appearance did not exhibit: Callus, Crepitus, Excoriation, Induration, Rash, Scarring, Dry/Scaly, Atrophie Blanche, Cyanosis, Ecchymosis, Hemosiderin Staining, Mottled, Pallor, Rubor, Erythema. [name] [name], [name] [name] ([id number]) Assessment Active Problems ICD-10 Type 2 diabetes mellitus with foot ulcer Non-pressure chronic ulcer of other part of right foot with fat layer exposed Charcot's arthropathy (tabetic) Essential (primary) hypertension Procedures Wound #1 Pre-procedure diagnosis of Wound #1 is a To be determined located on  the [place] . There was a Excisional Skin/Subcutaneous Tissue Debridement with a total area of 2.8 sq cm performed by STONE III, HOYT E., PA-C. With the following instrument(s): Curette Material removed includes Callus, Subcutaneous Tissue, and Slough after achieving pain control using Lidocaine 4% Topical Solution. No specimens were taken. A time out was conducted at 14:02, prior to the start of the procedure. A Minimum amount of bleeding was controlled with Pressure. The procedure was tolerated well with a pain level of 0 throughout and a pain level of 0 following the procedure. Post Debridement Measurements: 2cm length x 1.4cm width x 0.5cm depth; 1.1cm^3 volume. Character of Wound/Ulcer Post Debridement is improved. Post procedure Diagnosis Wound #1: Same as Pre-Procedure Plan Primary Wound Dressing: Wound #1 Right,Plantar Foot: Silver Alginate Secondary Dressing: Wound #1 Right,Plantar Foot: Gauze and Kerlix/Conform - Felt callous pad Dressing Change Frequency: Wound #1 Right,Plantar Foot: Change Dressing Monday, Wednesday, Friday Follow-up Appointments: Wound #1 Right,Plantar Foot: Return Appointment in 1 week. Off-Loading: Wound #1 Right,Plantar Foot: Open toe surgical shoe to: Home Health: Wound #1 Right,Plantar Foot: [place] Nurse may visit PRN to address patient s wound care needs. FACE TO FACE ENCOUNTER: MEDICARE and MEDICAID PATIENTS: I certify that this patient is under my care and that I had a face-to-face encounter that meets the physician face-to-face encounter requirements with this patient on this date. The [name] [name], [name] [name] ([id number]) encounter with the patient was in whole or in part for the following MEDICAL CONDITION: (primary reason for [place]) MEDICAL NECESSITY: I certify, that based on my  findings, NURSING services are a medically necessary home health service. HOME BOUND STATUS: I certify that my clinical  findings support that this patient is homebound (i.e., Due to illness or injury, pt requires aid of supportive devices such as crutches, cane, wheelchairs, walkers, the use of special transportation or the assistance of another person to leave their place of residence. There is a normal inability to leave the home and doing so requires considerable and taxing effort. Other absences are for medical reasons / religious services and are infrequent or of short duration when for other reasons). If current dressing causes regression in wound condition, may D/C ordered dressing product/s and apply Normal Saline Moist Dressing daily until next [place] / Other MD appointment. [place] of regression in wound condition at [phone]. Please direct any NON-WOUND related issues/requests for orders to patient's Primary Care Physician My suggestion currently is going to be for the time being following the sharp debridement say that we continue with the silver alginate dressings over the next week. At that point as long as everything appears to be doing well and there's no evidence of significant infection my suggestion would be for [place] to actually go ahead and initiate treatment with a total contact cast. The patient is in agreement that plan. We will subsequently see how things do with that as far as the offloading is concerned. In the meantime Louise felt to try to offload her foot. Please see above for specific wound care orders. We will see patient for re-evaluation in 1 week(s) here in the clinic. If anything worsens or changes patient will contact our office for additional recommendations. Electronic Signature(s) Signed: 03/02/2018 5:22:53 PM By: Worthy Keeler PA-C Entered By: Worthy Keeler on 03/02/2018 17:21:51 [name] [name] ([id number]) -------------------------------------------------------------------------------- ROS/PFSH Details Patient Name: [name] [name], [name] [name]. Date of Service: 03/02/2018 1:00 PM Medical Record Number: [id number] Patient Account Number: [id number] Date of Birth/Sex: [birth year] (72 y.o. F) Treating RN: Secundino Ginger Primary Care Provider: Delight Stare Other Clinician: Referring Provider: CLINE, TODD Treating Provider/Extender: Melburn Hake, HOYT Weeks in Treatment: 0 Information Obtained From Patient Other: son Wound History Do you currently have one or more open woundso No Have you tested positive for osteomyelitis (bone infection)o No Have you had any tests for circulation on your legso No Eyes Complaints and Symptoms: Positive for: Dry Eyes; Glasses / Contacts - glasses Negative for: Vision Changes Medical History: Positive for: Cataracts - od Negative for: Glaucoma; Optic Neuritis Hematologic/Lymphatic Complaints and Symptoms: Negative for: Bleeding / Clotting Disorders; Human Immunodeficiency Virus Medical History: Negative for: Anemia; Hemophilia; Human Immunodeficiency Virus; Lymphedema; Sickle Cell Disease Respiratory Complaints and Symptoms: Positive for: Shortness of Breath Negative for: Chronic or frequent coughs Medical History: Negative for: Aspiration; Asthma; Chronic Obstructive Pulmonary Disease (COPD); Pneumothorax; Sleep Apnea; Tuberculosis Cardiovascular Complaints and Symptoms: Negative for: Chest pain; LE edema Medical History: Positive for: Hypertension Negative for: Angina; Arrhythmia; Congestive Heart Failure; Coronary Artery Disease; Deep Vein Thrombosis; Hypotension; Myocardial Infarction; Peripheral Arterial Disease; Peripheral Venous Disease; Phlebitis; Vasculitis Gastrointestinal Complaints and Symptoms: Positive for: Frequent diarrhea Negative for: Nausea; Vomiting [name] [name], [name] [name] ([id number]) Medical History: Negative for: Cirrhosis ; Colitis; Crohnos; Hepatitis A; Hepatitis B; Hepatitis C Endocrine Complaints and Symptoms: Negative for: Hepatitis; Thyroid disease; Polydypsia  (Excessive Thirst) Medical History: Positive for: Type II Diabetes Negative for: Type I Diabetes Time with diabetes: 1997 Treated with: Oral agents, Diet Blood sugar tested every day: Yes Tested : once a day Genitourinary Complaints  and Symptoms: Negative for: Kidney failure/ Dialysis; Incontinence/dribbling Medical History: Negative for: End Stage Renal Disease Immunological Complaints and Symptoms: Negative for: Hives; Itching Medical History: Negative for: Lupus Erythematosus; Raynaudos; Scleroderma Integumentary (Skin) Complaints and Symptoms: Negative for: Wounds; Bleeding or bruising tendency; Breakdown; Swelling Medical History: Negative for: History of Burn; History of pressure wounds Musculoskeletal Complaints and Symptoms: Negative for: Muscle Pain; Muscle Weakness Medical History: Negative for: Gout; Rheumatoid Arthritis; Osteoarthritis; Osteomyelitis Neurologic Complaints and Symptoms: Negative for: Numbness/parasthesias; Focal/Weakness Medical History: Negative for: Dementia; Neuropathy; Quadriplegia; Paraplegia; Seizure Disorder Psychiatric HENLEE, DONOVAN ([id number]) Complaints and Symptoms: Negative for: Anxiety; Claustrophobia Medical History: Negative for: Anorexia/bulimia; Confinement Anxiety Ear/Nose/Mouth/Throat Medical History: Positive for: Chronic sinus problems/congestion Negative for: Middle ear problems Oncologic Medical History: Negative for: Received Chemotherapy; Received Radiation HBO Extended History Items Eyes: Ear/Nose/Mouth/Throat: Cataracts Chronic sinus problems/congestion Immunizations Pneumococcal Vaccine: Received Pneumococcal Vaccination: No Implantable Devices Family and Social History Cancer: No; Diabetes: Yes - Mother,Father; Heart Disease: No; Hereditary Spherocytosis: No; Hypertension: No; Kidney Disease: No; Lung Disease: No; Seizures: No; Stroke: No; Thyroid Problems: No; Tuberculosis: No; Never smoker;  Alcohol Use: Never; Drug Use: No History; Caffeine Use: Never; Financial Concerns: No; Food, Clothing or Shelter Needs: No; Transportation Concerns: No; Advanced Directives: No; Patient wants information on Advanced Directives; Advanced Directives information was provided to patient; Do not resuscitate: No; Living Will: No; Medical Power of Attorney: No Electronic Signature(s) Signed: 03/02/2018 4:17:43 PM By: Secundino Ginger Signed: 03/02/2018 5:22:53 PM By: Worthy Keeler PA-C Entered By: Secundino Ginger on 03/02/2018 13:32:41 GERARDINE, PELTZ ([id number]) -------------------------------------------------------------------------------- [place] Details Patient Name: [name] [name]. Date of Service: 03/02/2018 Medical Record Number: [id number] Patient Account Number: [id number] Date of Birth/Sex: 01-09-1946 (72 y.o. 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) I10 Essential (primary) hypertension Facility Procedures CPT4 Code: 03704888 Description: 91694 - DEB SUBQ TISSUE 20 SQ CM/< ICD-10 Diagnosis Description L97.512 Non-pressure chronic ulcer of other part of right foot with fat Modifier: layer exposed Quantity: 1 Physician Procedures CPT4 Code: 5038882 Description: 80034 - WC PHYS LEVEL 4 - NEW PT ICD-10 Diagnosis Description E11.621 Type 2 diabetes mellitus with foot ulcer L97.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

## 2018-03-09 ENCOUNTER — Encounter: Payer: Medicare HMO | Attending: Physician Assistant | Admitting: Physician Assistant

## 2018-03-09 DIAGNOSIS — E11621 Type 2 diabetes mellitus with foot ulcer: Secondary | ICD-10-CM | POA: Diagnosis present

## 2018-03-09 DIAGNOSIS — Z79899 Other long term (current) drug therapy: Secondary | ICD-10-CM | POA: Insufficient documentation

## 2018-03-09 DIAGNOSIS — L84 Corns and callosities: Secondary | ICD-10-CM | POA: Insufficient documentation

## 2018-03-09 DIAGNOSIS — L97512 Non-pressure chronic ulcer of other part of right foot with fat layer exposed: Secondary | ICD-10-CM | POA: Diagnosis not present

## 2018-03-09 DIAGNOSIS — I10 Essential (primary) hypertension: Secondary | ICD-10-CM | POA: Diagnosis not present

## 2018-03-09 DIAGNOSIS — M14671 Charcot's joint, right ankle and foot: Secondary | ICD-10-CM | POA: Diagnosis not present

## 2018-03-23 ENCOUNTER — Encounter: Payer: Medicare HMO | Admitting: Physician Assistant

## 2018-03-23 DIAGNOSIS — E11621 Type 2 diabetes mellitus with foot ulcer: Secondary | ICD-10-CM | POA: Diagnosis not present

## 2018-03-26 ENCOUNTER — Encounter: Payer: Medicare HMO | Admitting: Physician Assistant

## 2018-03-26 DIAGNOSIS — E11621 Type 2 diabetes mellitus with foot ulcer: Secondary | ICD-10-CM | POA: Diagnosis not present

## 2018-03-29 NOTE — Progress Notes (Signed)
SHATERRIA, SAGER ([id number]) Visit Report for 03/26/2018 Arrival Information Details Patient Name: [name] [name], [name] [name]. Date of Service: 03/26/2018 2:30 PM Medical Record Number: [id number] Patient Account Number: [id number] Date of Birth/Sex: [birth year] (72 y.o. F) Treating RN: Cornell Barman Primary Care Camerin Ladouceur: Delight Stare Other Clinician: Referring Shirah Roseman: Delight Stare Treating Artie Mcintyre/Extender: Melburn Hake, HOYT Weeks in Treatment: 3 Visit Information History Since Last Visit Added or deleted any medications: No Patient Arrived: Wheel Chair Any new allergies or adverse reactions: No Arrival Time: 14:24 Had a fall or experienced change in No Accompanied By: son activities of daily living that may affect Transfer Assistance: Manual risk of falls: Patient Identification Verified: Yes Signs or symptoms of abuse/neglect since last visito No Secondary Verification Process Completed: Yes Hospitalized since last visit: No Has Dressing in Place as Prescribed: Yes Has Footwear/Offloading in Place as Prescribed: Yes Right: Total Contact Cast Pain Present Now: No Electronic Signature(s) Signed: 03/26/2018 5:10:33 PM By: Gretta Cool, BSN, RN, CWS, Kim RN, BSN Entered By: Gretta Cool, BSN, RN, CWS, Kim on 03/26/2018 14:25:17 [name] [name] ([id number]) -------------------------------------------------------------------------------- Encounter Discharge Information Details Patient Name: [name] [name], [name] [name]. Date of Service: 03/26/2018 2:30 PM Medical Record Number: [id number] Patient Account Number: [id number] Date of Birth/Sex: Jul 06, 1945 (72 y.o. F) Treating RN: Montey Hora Primary Care Ambar Raphael: Delight Stare Other Clinician: Referring Jalayna Josten: Delight Stare Treating Charlissa Petros/Extender: Melburn Hake, HOYT Weeks in Treatment: 3 Encounter Discharge Information Items Discharge Condition: Stable Ambulatory Status: Wheelchair Discharge Destination: Home Transportation: Private Auto Accompanied By:  son Schedule Follow-up Appointment: Yes Clinical Summary of Care: Electronic Signature(s) Signed: 03/26/2018 5:10:26 PM By: Montey Hora Entered By: Montey Hora on 03/26/2018 17:10:26 [name] [name] ([id number]) -------------------------------------------------------------------------------- Lower Extremity Assessment Details Patient Name: [name] [name]. Date of Service: 03/26/2018 2:30 PM Medical Record Number: [id number] Patient Account Number: [id number] Date of Birth/Sex: 1945/09/01 (72 y.o. F) Treating RN: Cornell Barman Primary Care Shakina Choy: Delight Stare Other Clinician: Referring Lyda Colcord: Delight Stare Treating Chyrl Elwell/Extender: Melburn Hake, HOYT Weeks in Treatment: 3 Edema Assessment Assessed: [Left: No] [Right: No] Edema: [Left: N] [Right: o] Vascular Assessment Pulses: Dorsalis Pedis Palpable: [Right:Yes] Posterior Tibial Extremity colors, hair growth, and conditions: Extremity Color: [Right:Normal] Hair Growth on Extremity: [Right:No] Temperature of Extremity: [Right:Warm] Capillary Refill: [Right:< 3 seconds] Toe Nail Assessment Left: Right: Thick: No Discolored: No Deformed: No Improper Length and Hygiene: No Electronic Signature(s) Signed: 03/26/2018 5:10:33 PM By: Gretta Cool, BSN, RN, CWS, Kim RN, BSN Entered By: Gretta Cool, BSN, RN, CWS, Kim on 03/26/2018 14:37:23 [name] [name] ([id number]) -------------------------------------------------------------------------------- Multi Wound Chart Details Patient Name: [name] [name]. Date of Service: 03/26/2018 2:30 PM Medical Record Number: [id number] Patient Account Number: [id number] Date of Birth/Sex: [birth year] (72 y.o. 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] Comorbid History: [1:Cataracts, Chronic sinus problems/congestion, Hypertension, Type II Diabetes] [N/A:N/A] Date Acquired: [1:06/16/2017] [N/A:N/A] Weeks of Treatment: [1:3] [N/A:N/A] Wound Status: [1:Open] [N/A:N/A] Measurements L x W x D [1:1x0.9x0.3] [N/A:N/A] (cm) Area (cm) : [1:0.707] [N/A:N/A] Volume (cm) : [1:0.212] [N/A:N/A] % Reduction in Area: [1:67.80%] [N/A:N/A] % Reduction in Volume: [1:75.90%] [N/A:N/A] Classification: [1:Grade 2] [N/A:N/A] Exudate Amount: [1:Medium] [N/A:N/A] Exudate Type: [1:Serosanguineous] [N/A:N/A] Exudate  Color: [1:red, Fleagle] [N/A:N/A] Wound Margin: [1:Thickened] [N/A:N/A] Granulation Amount: [1:Large (67-100%)] [N/A:N/A] Granulation Quality: [1:Red] [N/A:N/A] Necrotic Amount: [1:Small (1-33%)] [N/A:N/A] Exposed Structures: [1:Fat Layer (Subcutaneous Tissue) Exposed: Yes Fascia: No Tendon: No Muscle: No Joint: No Bone: No] [N/A:N/A] Epithelialization: [1:None] [N/A:N/A] Periwound Skin Texture: [1:Excoriation: No Induration: No Callus: No Crepitus: No] [N/A:N/A] Rash: No Scarring: No Periwound Skin Moisture: Maceration: Yes N/A N/A Dry/Scaly: No Periwound Skin Color: Atrophie Blanche: No N/A N/A Cyanosis: No Ecchymosis: No Erythema: No Hemosiderin Staining: No Mottled: No Pallor: No Rubor: No Tenderness on Palpation: No N/A N/A Wound Preparation: Ulcer Cleansing: N/A N/A Rinsed/Irrigated with Saline Treatment Notes Electronic Signature(s) Signed: 03/27/2018 5:12:41 PM By: Montey Hora Entered By: Montey Hora on 03/26/2018 15:35:13 [name] [name]  ([id number]) -------------------------------------------------------------------------------- [place] Details Patient Name: [name] [name], [name] [name]. Date of Service: 03/26/2018 2:30 PM Medical Record Number: [id number] Patient Account Number: [id number] Date of Birth/Sex: Nov 10, 1945 (72 y.o. F) Treating RN: Montey Hora Primary Care Semone Orlov: Delight Stare Other Clinician: Referring Esteen Delpriore: Delight Stare Treating Sharalee Witman/Extender: Melburn Hake, HOYT Weeks in Treatment: 3 Active Inactive Pressure Nursing Diagnoses: Knowledge deficit related to causes and risk factors for pressure ulcer development Potential for impaired tissue integrity related to pressure, friction, moisture, and shear Goals: Patient will remain free from development of additional pressure ulcers Date Initiated: 03/02/2018 Target Resolution Date: 05/02/2018 Goal Status: Active Patient/caregiver will verbalize risk factors for pressure ulcer development Date Initiated: 03/02/2018 Target Resolution Date: 04/01/2018 Goal Status: Active Interventions: Assess: immobility, friction, shearing, incontinence upon admission and as needed Assess offloading mechanisms upon admission and as needed Provide education on pressure ulcers Notes: Wound/Skin Impairment Nursing Diagnoses: Impaired tissue integrity Knowledge deficit related to ulceration/compromised skin integrity Goals: Ulcer/skin breakdown will have a volume reduction of 30% by week 4 Date Initiated: 03/02/2018 Target Resolution Date: 04/01/2018 Goal Status: Active Interventions: Assess patient/caregiver ability to obtain necessary supplies Assess patient/caregiver ability to perform ulcer/skin care regimen upon admission and as needed Assess ulceration(s) every visit Provide education on ulcer and skin care Notes: SERGIO, HOBART ([id number]) Electronic Signature(s) Signed: 03/27/2018 5:12:41 PM By: Montey Hora Entered By: Montey Hora on  03/26/2018 15:35:07 [name] [name] ([id number]) -------------------------------------------------------------------------------- Pain Assessment Details Patient Name: [name] [name]. Date of Service: 03/26/2018 2:30 PM Medical Record Number: [id number] Patient Account Number: [id number] Date of Birth/Sex: 1945-05-30 (72 y.o. F) Treating RN: Cornell Barman Primary Care Vadhir Mcnay: Delight Stare Other Clinician: Referring Keldrick Pomplun: Delight Stare Treating Jamari Diana/Extender: Melburn Hake, HOYT Weeks in Treatment: 3 Active Problems Location of Pain Severity and Description of Pain Patient Has Paino No Site Locations With Dressing Change: No Pain Management and Medication Current Pain Management: Electronic Signature(s) Signed: 03/26/2018 5:10:33 PM By: Gretta Cool, BSN, RN, CWS, Kim RN, BSN Entered By: Gretta Cool, BSN, RN, CWS, Kim on 03/26/2018 14:25:26 [name] [name] ([id number]) -------------------------------------------------------------------------------- Patient/Caregiver Education Details Patient Name: [name] [name]. Date of Service: 03/26/2018 2:30 PM Medical Record Number: [id number] Patient Account Number: [id number] Date of Birth/Gender: 1945/12/22 (72 y.o. F) Treating RN: Montey Hora Primary Care Physician: Delight Stare Other Clinician: Referring Physician: Delight Stare Treating Physician/Extender: Sharalyn Ink in Treatment: 3 Education Assessment Education Provided To: Patient and Caregiver Education Topics Provided Offloading: Handouts: Other: TCC precautions Methods: Explain/Verbal Responses: State content correctly Electronic Signature(s) Signed: 03/27/2018 5:12:41 PM By: Montey Hora Entered By: Montey Hora on 03/26/2018 17:09:42 [name] [name] ([id number]) -------------------------------------------------------------------------------- Wound Assessment Details Patient Name: [name] [name]. Date of Service: 03/26/2018 2:30 PM Medical Record Number:  [id number] Patient  Account Number: [id number] Date of Birth/Sex: 01-05-1946 (72 y.o. F) Treating RN: Cornell Barman Primary Care Jilliana Burkes: Delight Stare Other Clinician: Referring Zenas Santa: Delight Stare Treating Gina Costilla/Extender: STONE III, HOYT Weeks in Treatment: 3 Wound Status Wound Number: 1 Primary Diabetic Wound/Ulcer of the Lower Extremity Etiology: Wound Location: Right Foot - Plantar Wound Open Wounding Event: Gradually Appeared Status: Date Acquired: 06/16/2017 Comorbid Cataracts, Chronic sinus problems/congestion, Weeks Of Treatment: 3 History: Hypertension, Type II Diabetes Clustered Wound: No Photos Photo Uploaded By: Gretta Cool, BSN, RN, CWS, Kim on 03/26/2018 16:55:15 Wound Measurements Length: (cm) 1 Width: (cm) 0.9 Depth: (cm) 0.3 Area: (cm) 0.707 Volume: (cm) 0.212 % Reduction in Area: 67.8% % Reduction in Volume: 75.9% Epithelialization: None Tunneling: No Undermining: No Wound Description Classification: Grade 2 Foul Odor Wound Margin: Thickened Slough/Fi Exudate Amount: Medium Exudate Type: Serosanguineous Exudate Color: red, Mathwig After Cleansing: No brino Yes Wound Bed Granulation Amount: Large (67-100%) Exposed Structure Granulation Quality: Red Fascia Exposed: No Necrotic Amount: Small (1-33%) Fat Layer (Subcutaneous Tissue) Exposed: Yes Necrotic Quality: Adherent Slough Tendon Exposed: No Muscle Exposed: No Joint Exposed: No Bone Exposed: No Periwound Skin Texture [name] [name], [name] [name] ([id number]) Texture Color No Abnormalities Noted: No No Abnormalities Noted: No Callus: No Atrophie Blanche: No Crepitus: No Cyanosis: No Excoriation: No Ecchymosis: No Induration: No Erythema: No Rash: No Hemosiderin Staining: No Scarring: No Mottled: No Pallor: No Moisture Rubor: No No Abnormalities Noted: No Dry / Scaly: No Maceration: Yes Wound Preparation Ulcer Cleansing: Rinsed/Irrigated with Saline Treatment Notes Wound #1 (Right, Plantar  Foot) Notes silvercel, foam, TCC applied in clinic today Electronic Signature(s) Signed: 03/26/2018 5:10:33 PM By: Gretta Cool, BSN, RN, CWS, Kim RN, BSN Entered By: Gretta Cool, BSN, RN, CWS, Kim on 03/26/2018 14:35:54 [name] [name] ([id number]) -------------------------------------------------------------------------------- [place]als Details Patient Name: [name] [name]. Date of Service: 03/26/2018 2:30 PM Medical Record Number: [id number] Patient Account Number: [id number] Date of Birth/Sex: 08-06-1945 (72 y.o. F) Treating RN: Cornell Barman Primary Care Dawnya Grams: Delight Stare Other Clinician: Referring Auryn Paige: Delight Stare Treating Silviano Neuser/Extender: Melburn Hake, HOYT Weeks in Treatment: 3 Vital Signs Time Taken: 14:25 Temperature (F): 98.3 Height (in): 67 Pulse (bpm): 61 Weight (lbs): 200 Respiratory Rate (breaths/min): 16 Body Mass Index (BMI): 31.3 Blood Pressure (mmHg): 141/62 Reference Range: 80 - 120 mg / dl Electronic Signature(s) Signed: 03/26/2018 5:10:33 PM By: Gretta Cool, BSN, RN, CWS, Kim RN, BSN Entered By: Gretta Cool, BSN, RN, CWS, Kim on 03/26/2018 14:26:07

## 2018-03-29 NOTE — Progress Notes (Signed)
SHINE, SCROGHAM ([id number]) Visit Report for 03/26/2018 Chief Complaint Document Details Patient Name: [name] [name], [name] [name]. Date of Service: 03/26/2018 2:30 PM Medical Record Number: [id number] Patient Account Number: [id number] Date of Birth/Sex: 08/08/1945 (72 y.o. F) Treating RN: Cornell Barman Primary Care Provider: Delight Stare Other Clinician: Referring Provider: Delight Stare Treating Provider/Extender: Melburn Hake, HOYT Weeks in Treatment: 3 Information Obtained from: Patient Chief Complaint Right foot ulcer Electronic Signature(s) Signed: 03/26/2018 5:48:37 PM By: Worthy Keeler PA-C Entered By: Worthy Keeler on 03/26/2018 14:49:35 [name] [name] ([id number]) -------------------------------------------------------------------------------- HPI Details Patient Name: [name] [name]. Date of Service: 03/26/2018 2:30 PM Medical Record Number: [id number] Patient Account Number: [id number] Date of Birth/Sex: Nov 10, 1945 (72 y.o. F) Treating RN: Cornell Barman Primary Care Provider: Delight Stare Other Clinician: Referring Provider: Delight Stare Treating Provider/Extender: Melburn Hake, HOYT Weeks in Treatment: 3 History of Present Illness HPI Description: 03/02/18 on evaluation today patient presents for initial evaluation and clinic today as a referral from Dr. Sharlotte Alamo who was with [facility] 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 This is something that he has been managing including the the debridement's which the patient tells me he has been doing during her office visits. She does have a history of diabetes mellitus type II. The dressing of choice as indicated by Dr. Cleda Mccreedy was Bactroban ointment and a sterile bandage. X-rays were deferred at that point as Dr. Cleda Mccreedy did not feel any additional x-rays were necessary. He stated point blank in her note that with the chronic malalignment of the right foot if anything gets deeper and there is  potentially bone infection that her only viable option would be a below the knee amputation. Therefore she was patient has finished to see if there's anything we can do to help. A total contact cast has not been attempted by Dr. Cleda Mccreedy. The last office note that we have from him is actually from 02/12/18. The patient has no significant pain in this region which is good news. No fevers, chills, nausea, or vomiting noted at this time. She does have a history of hypertension. 03/09/18 on evaluation today patient actually appears to be doing much better in regard to her plantar foot wound. This is appears to show signs of improvement as far as the overall size is concerned this is great news. She also seems to have a fairly good appearance to the which is also good news. Overall I'm very pleased with the parents. She does tell me that she's having some issues with transportation she's been in touch with social services. With that being said she states that she's not sure she'd even be able to be here next week. Subsequently she definitely would be able to be here in two days for her first mandatory cast change. Nonetheless it sounds as if we're gonna have to put off applying the total contact cast for the time being. 03/23/18 on evaluation today patient actually appears to be doing about the same in regard to her ulcer. She has been tolerating the dressing changes without complication. With that being said the offloading shoe though it didn't seem to allow for any worsening of the wound in general there still a significant amount of callous buildup unfortunately present at this time. Therefore I do think that she would be a good candidate for the total contact cast which I believe will be a much better option for her. 03/26/18 on evaluation today  patient actually appears to be doing very well in regard to her wound. I do feel like the cast was beneficial for her she had a little bit a slight rubbing on  the anterior portion of her shin but nothing that appeared to be too significant at this point which was good news. Nonetheless I do think that this is doing a good job for her as far as the overall wound is concerned. Electronic Signature(s) Signed: 03/26/2018 5:48:37 PM By: Worthy Keeler PA-C Entered By: Worthy Keeler on 03/26/2018 15:37:23 [name] [name] ([id number]) -------------------------------------------------------------------------------- Physical Exam Details Patient Name: [name] [name], [name] [name]. Date of Service: 03/26/2018 2:30 PM Medical Record Number: [id number] Patient Account Number: [id number] Date of Birth/Sex: January 09, 1946 (72 y.o. F) Treating RN: Cornell Barman Primary Care Provider: Delight Stare Other Clinician: Referring Provider: Delight Stare Treating Provider/Extender: STONE III, HOYT Weeks in Treatment: 3 Constitutional Well-nourished and well-hydrated in no acute distress. Respiratory normal breathing without difficulty. Psychiatric this patient is able to make decisions and demonstrates good insight into disease process. Alert and Oriented x 3. pleasant and cooperative. Notes Patient's wound bed did not require sharp debridement today it appears to be doing excellent at this point which is great news. Overall I'm very pleased with the progress. She of course is here for her first total contact cast change. We did reapplied the total contact cast which I perform myself once the wound had been evaluated and redressed. Electronic Signature(s) Signed: 03/26/2018 5:48:37 PM By: Worthy Keeler PA-C Entered By: Worthy Keeler on 03/26/2018 15:38:01 [name] [name] ([id number]) -------------------------------------------------------------------------------- Physician Orders Details Patient Name: [name] [name], [name] [name]. Date of Service: 03/26/2018 2:30 PM Medical Record Number: [id number] Patient Account Number: [id number] Date of Birth/Sex: Jun 27, 1945 (72 y.o. F) Treating  RN: Montey Hora Primary Care Provider: Delight Stare Other Clinician: Referring Provider: Delight Stare Treating Provider/Extender: Melburn Hake, HOYT Weeks in Treatment: 3 Verbal / Phone Orders: No Diagnosis Coding ICD-10 Coding Code Description E11.621 Type 2 diabetes mellitus with foot ulcer L97.512 Non-pressure chronic ulcer of other part of right foot with fat layer exposed A52.16 Charcot's arthropathy (tabetic) I10 Essential (primary) hypertension L84 Corns and callosities Primary Wound Dressing Wound #1 Right,Plantar Foot o Silver Alginate Secondary Dressing Wound #1 Right,Plantar Foot o Gauze and Kerlix/Conform - Felt callous pad Dressing Change Frequency Wound #1 Right,Plantar Foot o Other: - Monday 03/30/18 Follow-up Appointments Wound #1 Right,Plantar Foot o Return Appointment in 1 week. Off-Loading Wound #1 Right,Plantar Foot o Total Contact Cast to Right Lower Extremity Home Health Wound #1 [place] Visits - Patient is in Total Contact Cast, hold wound care services o Home Health Nurse may visit PRN to address patientos wound care needs. o FACE TO FACE ENCOUNTER: MEDICARE and MEDICAID PATIENTS: I certify that this patient is under my care and that I had a face-to-face encounter that meets the physician face-to-face encounter requirements with this patient on this date. The encounter with the patient was in whole or in part for the following MEDICAL CONDITION: (primary reason for [place] [employer]) MEDICAL NECESSITY: I certify, that based on my findings, NURSING services are a medically necessary home health service. HOME BOUND STATUS: I certify that my clinical findings support that this patient is homebound (i.e., Due to illness or injury, pt requires aid of supportive devices such as crutches, cane, wheelchairs, walkers, the use of special transportation or the assistance of another person to leave their place of  residence. There is a normal  inability to leave the home [name] [name], [name] [name]. ([id number]) and doing so requires considerable and taxing effort. Other absences are for medical reasons / religious services and are infrequent or of short duration when for other reasons). o If current dressing causes regression in wound condition, may D/C ordered dressing product/s and apply Normal Saline Moist Dressing daily until next [place] / Other MD appointment. [place] of regression in wound condition at [phone]. o Please direct any NON-WOUND related issues/requests for orders to patient's Primary Care Physician Electronic Signature(s) Signed: 03/26/2018 5:48:37 PM By: Worthy Keeler PA-C Signed: 03/27/2018 5:12:41 PM By: Montey Hora Entered By: Montey Hora on 03/26/2018 15:35:50 [name] [name] ([id number]) -------------------------------------------------------------------------------- Problem List Details Patient Name: BRANDA, CHAUDHARY. Date of Service: 03/26/2018 2:30 PM Medical Record Number: [id number] Patient Account Number: [id number] Date of Birth/Sex: [birth year] (72 y.o. F) Treating RN: Cornell Barman Primary Care Provider: Delight Stare Other Clinician: Referring Provider: Delight Stare Treating Provider/Extender: Melburn Hake, HOYT Weeks in Treatment: 3 Active Problems ICD-10 Evaluated Encounter Code Description Active Date Today Diagnosis E11.621 Type 2 diabetes mellitus with foot ulcer 03/02/2018 No Yes L97.512 Non-pressure chronic ulcer of other part of right foot with fat 03/02/2018 No Yes layer exposed A52.16 Charcot's arthropathy (tabetic) 03/02/2018 No Yes I10 Essential (primary) hypertension 03/02/2018 No Yes L84 Corns and callosities 03/09/2018 No Yes Inactive Problems Resolved Problems Electronic Signature(s) Signed: 03/26/2018 5:48:37 PM By: Worthy Keeler PA-C Entered By: Worthy Keeler on 03/26/2018 14:49:31 [name] [name]  ([id number]) -------------------------------------------------------------------------------- Progress Note Details Patient Name: [name] [name]. Date of Service: 03/26/2018 2:30 PM Medical Record Number: [id number] Patient Account Number: [id number] Date of Birth/Sex: May 07, 1945 (72 y.o. F) Treating RN: Cornell Barman Primary Care Provider: Delight Stare Other Clinician: Referring Provider: Delight Stare Treating Provider/Extender: Melburn Hake, HOYT Weeks in Treatment: 3 Subjective Chief Complaint Information obtained from Patient Right foot ulcer History of Present Illness (HPI) 03/02/18 on evaluation today patient presents for initial evaluation and clinic today as a referral from Dr. Sharlotte Alamo who was with [facility]. 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 This is something that he has been managing including the the debridement's which the patient tells me he has been doing during her office visits. She does have a history of diabetes mellitus type II. The dressing of choice as indicated by Dr. Cleda Mccreedy was Bactroban ointment and a sterile bandage. X-rays were deferred at that point as Dr. Cleda Mccreedy did not feel any additional x-rays were necessary. He stated point blank in her note that with the chronic malalignment of the right foot if anything gets deeper and there is potentially bone infection that her only viable option would be a below the knee amputation. Therefore she was patient has finished to see if there's anything we can do to help. A total contact cast has not been attempted by Dr. Cleda Mccreedy. The last office note that we have from him is actually from 02/12/18. The patient has no significant pain in this region which is good news. No fevers, chills, nausea, or vomiting noted at this time. She does have a history of hypertension. 03/09/18 on evaluation today patient actually appears to be doing much better in regard to her plantar foot wound.  This is appears to show signs of improvement as far as the overall size is concerned this is great news. She also seems to have a fairly good appearance to the  which is also good news. Overall I'm very pleased with the parents. She does tell me that she's having some issues with transportation she's been in touch with social services. With that being said she states that she's not sure she'd even be able to be here next week. Subsequently she definitely would be able to be here in two days for her first mandatory cast change. Nonetheless it sounds as if we're gonna have to put off applying the total contact cast for the time being. 03/23/18 on evaluation today patient actually appears to be doing about the same in regard to her ulcer. She has been tolerating the dressing changes without complication. With that being said the offloading shoe though it didn't seem to allow for any worsening of the wound in general there still a significant amount of callous buildup unfortunately present at this time. Therefore I do think that she would be a good candidate for the total contact cast which I believe will be a much better option for her. 03/26/18 on evaluation today patient actually appears to be doing very well in regard to her wound. I do feel like the cast was beneficial for her she had a little bit a slight rubbing on the anterior portion of her shin but nothing that appeared to be too significant at this point which was good news. Nonetheless I do think that this is doing a good job for her as far as the overall wound is concerned. Patient History Information obtained from Patient. Family History Diabetes - Mother,Father, No family history of Cancer, Heart Disease, Hereditary Spherocytosis, Hypertension, Kidney Disease, Lung Disease, Seizures, Stroke, Thyroid Problems, Tuberculosis. Social History Never smoker, Alcohol Use - Never, Drug Use - No History, Caffeine Use - Never. [name] [name], [name] [name]  ([id number]) Review of Systems (ROS) Constitutional Symptoms (General Health) Denies complaints or symptoms of Fever, Chills. Respiratory The patient has no complaints or symptoms. Cardiovascular The patient has no complaints or symptoms. Psychiatric The patient has no complaints or symptoms. Objective Constitutional Well-nourished and well-hydrated in no acute distress. Vitals Time Taken: 2:25 PM, Height: 67 in, Weight: 200 lbs, BMI: 31.3, Temperature: 98.3 F, Pulse: 61 bpm, Respiratory Rate: 16 breaths/min, Blood Pressure: 141/62 mmHg. Respiratory normal breathing without difficulty. Psychiatric this patient is able to make decisions and demonstrates good insight into disease process. Alert and Oriented x 3. pleasant and cooperative. General Notes: Patient's wound bed did not require sharp debridement today it appears to be doing excellent at this point which is great news. Overall I'm very pleased with the progress. She of course is here for her first total contact cast change. We did reapplied the total contact cast which I perform myself once the wound had been evaluated and redressed. Integumentary (Hair, Skin) Wound #1 status is Open. Original cause of wound was Gradually Appeared. The wound is located on the [place]. The wound measures 1cm length x 0.9cm width x 0.3cm depth; 0.707cm^2 area and 0.212cm^3 volume. There is Fat Layer (Subcutaneous Tissue) Exposed exposed. There is no tunneling or undermining noted. There is a medium amount of serosanguineous drainage noted. The wound margin is thickened. There is large (67-100%) red granulation within the wound bed. There is a small (1-33%) amount of necrotic tissue within the wound bed including Adherent Slough. The periwound skin appearance exhibited: Maceration. The periwound skin appearance did not exhibit: Callus, Crepitus, Excoriation, Induration, Rash, Scarring, Dry/Scaly, Atrophie Blanche, Cyanosis, Ecchymosis,  Hemosiderin Staining, Mottled, Pallor, Rubor, Erythema. Assessment Active Problems ICD-10 Type  2 diabetes mellitus with foot ulcer Non-pressure chronic ulcer of other part of right foot with fat layer exposed [name] [name], [name] [name] ([id number]) Charcot's arthropathy (tabetic) Essential (primary) hypertension Corns and callosities Procedures Wound #1 Pre-procedure diagnosis of Wound #1 is a Diabetic Wound/Ulcer of the Lower Extremity located on the [place] . There was a Total Contact Cast Procedure by STONE III, HOYT E., PA-C. Post procedure Diagnosis Wound #1: Same as Pre-Procedure Plan Primary Wound Dressing: Wound #1 Right,Plantar Foot: Silver Alginate Secondary Dressing: Wound #1 Right,Plantar Foot: Gauze and Kerlix/Conform - Felt callous pad Dressing Change Frequency: Wound #1 Right,Plantar Foot: Other: - Monday 03/30/18 Follow-up Appointments: Wound #1 Right,Plantar Foot: Return Appointment in 1 week. Off-Loading: Wound #1 Right,Plantar Foot: Total Contact Cast to Right Lower Extremity Home Health: Wound #1 Right,Plantar Foot: [place] Visits - Patient is in Total Contact Cast, hold wound care services Home Health Nurse may visit PRN to address patient s wound care needs. FACE TO FACE ENCOUNTER: MEDICARE and MEDICAID PATIENTS: I certify that this patient is under my care and that I had a face-to-face encounter that meets the physician face-to-face encounter requirements with this patient on this date. The encounter with the patient was in whole or in part for the following MEDICAL CONDITION: (primary reason for [place]) MEDICAL NECESSITY: I certify, that based on my findings, NURSING services are a medically necessary home health service. HOME BOUND STATUS: I certify that my clinical findings support that this patient is homebound (i.e., Due to illness or injury, pt requires aid of supportive devices such as crutches, cane, wheelchairs, walkers,  the use of special transportation or the assistance of another person to leave their place of residence. There is a normal inability to leave the home and doing so requires considerable and taxing effort. Other absences are for medical reasons / religious services and are infrequent or of short duration when for other reasons). If current dressing causes regression in wound condition, may D/C ordered dressing product/s and apply Normal Saline Moist Dressing daily until next [place] / Other MD appointment. [place] of regression in wound condition at [phone]. Please direct any NON-WOUND related issues/requests for orders to patient's Primary Care Physician [name] [name], [name] [name]. ([id number]) I am going to recommend that we continue with the above wound care measures. The patient is in agreement with plan. We will subsequently see were things stand at follow-up. If anything changes or worsens meantime she let me know otherwise I plan to see her on Monday for her next cast change. Please see above for specific wound care orders. We will see patient for re-evaluation in 1 week(s) here in the clinic. If anything worsens or changes patient will contact our office for additional recommendations. Electronic Signature(s) Signed: 03/26/2018 5:48:37 PM By: Worthy Keeler PA-C Entered By: Worthy Keeler on 03/26/2018 17:06:58 [name] [name] ([id number]) -------------------------------------------------------------------------------- ROS/PFSH Details Patient Name: [name] [name], [name] [name]. Date of Service: 03/26/2018 2:30 PM Medical Record Number: [id number] Patient Account Number: [id number] Date of Birth/Sex: 1945/09/26 (72 y.o. F) Treating RN: Cornell Barman Primary Care Provider: Delight Stare Other Clinician: Referring Provider: Delight Stare Treating Provider/Extender: Melburn Hake, HOYT Weeks in Treatment: 3 Information Obtained From Patient Wound History Do you currently have  one or more open woundso No Have you tested positive for osteomyelitis (bone infection)o No Have you had any tests for circulation on your legso No Constitutional Symptoms (General Health) Complaints and Symptoms: Negative for: Fever; Chills Eyes Medical  History: Positive for: Cataracts - od Negative for: Glaucoma; Optic Neuritis Ear/Nose/Mouth/Throat Medical History: Positive for: Chronic sinus problems/congestion Negative for: Middle ear problems Hematologic/Lymphatic Medical History: Negative for: Anemia; Hemophilia; Human Immunodeficiency Virus; Lymphedema; Sickle Cell Disease Respiratory Complaints and Symptoms: No Complaints or Symptoms Medical History: Negative for: Aspiration; Asthma; Chronic Obstructive Pulmonary Disease (COPD); Pneumothorax; Sleep Apnea; Tuberculosis Cardiovascular Complaints and Symptoms: No Complaints or Symptoms Medical History: Positive for: Hypertension Negative for: Angina; Arrhythmia; Congestive Heart Failure; Coronary Artery Disease; Deep Vein Thrombosis; Hypotension; Myocardial Infarction; Peripheral Arterial Disease; Peripheral Venous Disease; Phlebitis; Vasculitis Gastrointestinal [name] [name], [name] [name] ([id number]) Medical History: Negative for: Cirrhosis ; Colitis; Crohnos; Hepatitis A; Hepatitis B; Hepatitis C Endocrine Medical History: Positive for: Type II Diabetes Negative for: Type I Diabetes Time with diabetes: 1997 Treated with: Oral agents, Diet Blood sugar tested every day: Yes Tested : once a day Genitourinary Medical History: Negative for: End Stage Renal Disease Immunological Medical History: Negative for: Lupus Erythematosus; Raynaudos; Scleroderma Integumentary (Skin) Medical History: Negative for: History of Burn; History of pressure wounds Musculoskeletal Medical History: Negative for: Gout; Rheumatoid Arthritis; Osteoarthritis; Osteomyelitis Neurologic Medical History: Negative for: Dementia; Neuropathy;  Quadriplegia; Paraplegia; Seizure Disorder Oncologic Medical History: Negative for: Received Chemotherapy; Received Radiation Psychiatric Complaints and Symptoms: No Complaints or Symptoms Medical History: Negative for: Anorexia/bulimia; Confinement Anxiety HBO Extended History Items Eyes: Ear/Nose/Mouth/Throat: Cataracts Chronic sinus problems/congestion Immunizations Pneumococcal Vaccine: Received Pneumococcal Vaccination: No [name] [name], [name] [name] ([id number]) Implantable Devices Family and Social History Cancer: No; Diabetes: Yes - Mother,Father; Heart Disease: No; Hereditary Spherocytosis: No; Hypertension: No; Kidney Disease: No; Lung Disease: No; Seizures: No; Stroke: No; Thyroid Problems: No; Tuberculosis: No; Never smoker; Alcohol Use: Never; Drug Use: No History; Caffeine Use: Never; Financial Concerns: No; Food, Clothing or Shelter Needs: No; Transportation Concerns: No; Advanced Directives: No; Patient wants information on Advanced Directives; Advanced Directives information was provided to patient; Do not resuscitate: No; Living Will: No; Medical Power of Attorney: No Physician Affirmation I have reviewed and agree with the above information. Electronic Signature(s) Signed: 03/26/2018 5:10:33 PM By: Gretta Cool, BSN, RN, CWS, Kim RN, BSN Signed: 03/26/2018 5:48:37 PM By: Worthy Keeler PA-C Entered By: Worthy Keeler on 03/26/2018 15:37:39 [name] [name], [name] [name] ([id number]) -------------------------------------------------------------------------------- Total Contact Cast Details Patient Name: ARES, TEGTMEYER. Date of Service: 03/26/2018 2:30 PM Medical Record Number: [id number] Patient Account Number: [id number] Date of Birth/Sex: Jul 11, 1945 (72 y.o. F) Treating RN: Cornell Barman Primary Care Provider: Delight Stare Other Clinician: Referring Provider: Delight Stare Treating Provider/Extender: Melburn Hake, HOYT Weeks in Treatment: 3 Total Contact Cast Applied for Wound Assessment: Wound  #1 Right,Plantar Foot Performed By: Physician Emilio Math., PA-C Post Procedure Diagnosis Same as Pre-procedure Electronic Signature(s) Signed: 03/26/2018 5:48:37 PM By: Worthy Keeler PA-C Entered By: Worthy Keeler on 03/26/2018 15:42:07 [name] [name] ([id number]) -------------------------------------------------------------------------------- SuperBill Details Patient Name: [name] [name]. Date of Service: 03/26/2018 Medical Record Number: [id number] Patient Account Number: [id number] Date of Birth/Sex: 1946-01-02 (72 y.o. 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) I10 Essential (primary) hypertension L84 Corns and callosities Facility Procedures CPT4 Code: 88416606 Description: 30160 - APPLY TOTAL CONTACT LEG CAST ICD-10 Diagnosis Description L97.512 Non-pressure chronic ulcer of other part of  right foot with fat Modifier: layer exposed Quantity: 1 Physician Procedures CPT4 Code: 2993716 Description: 96789 - WC PHYS APPLY TOTAL CONTACT CAST ICD-10 Diagnosis Description 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

## 2018-03-30 ENCOUNTER — Encounter: Payer: Medicare HMO | Admitting: Physician Assistant

## 2018-03-30 DIAGNOSIS — E11621 Type 2 diabetes mellitus with foot ulcer: Secondary | ICD-10-CM | POA: Diagnosis not present

## 2018-04-03 NOTE — Progress Notes (Signed)
[name], [name] ([id number]) Visit Report for 03/30/2018 Arrival Information Details Patient Name: [name] [name], [name] [name]. Date of Service: 03/30/2018 2:00 PM Medical Record Number: [id number] Patient Account Number: [id number] Date of Birth/Sex: [birth year] (72 y.o. F) Treating RN: Cornell Barman Primary Care Taggert Bozzi: Delight Stare Other Clinician: Referring Kevonte Vanecek: Delight Stare Treating Anetra Czerwinski/Extender: Melburn Hake, HOYT Weeks in Treatment: 4 Visit Information History Since Last Visit Added or deleted any medications: No Patient Arrived: Cane Any new allergies or adverse reactions: No Arrival Time: 13:50 Had a fall or experienced change in No Accompanied By: son activities of daily living that may affect Transfer Assistance: Manual risk of falls: Patient Identification Verified: Yes Signs or symptoms of abuse/neglect since last visito No Secondary Verification Process Completed: Yes Hospitalized since last visit: No Implantable device outside of the clinic excluding No cellular tissue based products placed in the center since last visit: Has Dressing in Place as Prescribed: Yes Has Footwear/Offloading in Place as Prescribed: Yes Right: Total Contact Cast Pain Present Now: No Electronic Signature(s) Signed: 03/31/2018 1:27:23 PM By: Gretta Cool, BSN, RN, CWS, Kim RN, BSN Entered By: Gretta Cool, BSN, RN, CWS, Kim on 03/30/2018 13:51:28 [name] [name] ([id number]) -------------------------------------------------------------------------------- Clinic Level of Care Assessment Details Patient Name: [name], [name]. Date of Service: 03/30/2018 2:00 PM Medical Record Number: [id number] Patient Account Number: [id number] Date of Birth/Sex: 1945/11/03 (72 y.o. F) Treating RN: Harold Barban Primary Care Aracelia Brinson: Delight Stare Other Clinician: Referring Thresa Dozier: Delight Stare Treating Javia Dillow/Extender: STONE III, HOYT Weeks in Treatment: 4 Clinic Level of Care Assessment Items TOOL 1 Quantity  Score []  - Use when EandM and Procedure is performed on INITIAL visit 0 ASSESSMENTS - Nursing Assessment / Reassessment X - General Physical Exam (combine w/ comprehensive assessment (listed just below) when 1 20 performed on new pt. evals) X- 1 25 Comprehensive Assessment (HX, ROS, Risk Assessments, Wounds Hx, etc.) ASSESSMENTS - Wound and Skin Assessment / Reassessment []  - Dermatologic / Skin Assessment (not related to wound area) 0 ASSESSMENTS - Ostomy and/or Continence Assessment and Care []  - Incontinence Assessment and Management 0 []  - 0 Ostomy Care Assessment and Management (repouching, etc.) PROCESS - Coordination of Care X - Simple Patient / Family Education for ongoing care 1 15 []  - 0 Complex (extensive) Patient / Family Education for ongoing care X- 1 10 Staff obtains Programmer, systems, Records, Test Results / Process Orders []  - 0 Staff telephones HHA, Nursing Homes / Clarify orders / etc []  - 0 Routine Transfer to another Facility (non-emergent condition) []  - 0 Routine Hospital Admission (non-emergent condition) []  - 0 New Admissions / Biomedical engineer / Ordering NPWT, Apligraf, etc. []  - 0 Emergency Hospital Admission (emergent condition) PROCESS - Special Needs []  - Pediatric / Minor Patient Management 0 []  - 0 Isolation Patient Management []  - 0 Hearing / Language / Visual special needs []  - 0 Assessment of Community assistance (transportation, D/C planning, etc.) []  - 0 Additional assistance / Altered mentation []  - 0 Support Surface(s) Assessment (bed, cushion, seat, etc.) MILESSA, HOGAN. ([id number]) INTERVENTIONS - Miscellaneous []  - External ear exam 0 []  - 0 Patient Transfer (multiple staff / Civil Service fast streamer / Similar devices) []  - 0 Simple Staple / Suture removal (25 or less) []  - 0 Complex Staple / Suture removal (26 or more) []  - 0 Hypo/Hyperglycemic Management (do not check if billed separately) X- 1 15 Ankle / Brachial Index (ABI) - do  not check if billed separately Has the patient been seen at the hospital within  the last three years: Yes Total Score: 85 Level Of Care: New/Established - Level 3 Electronic Signature(s) Signed: 03/31/2018 1:37:43 PM By: Harold Barban Entered By: Harold Barban on 03/30/2018 15:18:59 [name] [name] ([id number]) -------------------------------------------------------------------------------- Encounter Discharge Information Details Patient Name: [name], [name]. Date of Service: 03/30/2018 2:00 PM Medical Record Number: [id number] Patient Account Number: [id number] Date of Birth/Sex: 1945/10/05 (71 y.o. F) Treating RN: Montey Hora Primary Care Aristea Posada: Delight Stare Other Clinician: Referring Inaki Vantine: Delight Stare Treating Dasani Thurlow/Extender: Melburn Hake, HOYT Weeks in Treatment: 4 Encounter Discharge Information Items Discharge Condition: Stable Ambulatory Status: Cane Discharge Destination: Home Transportation: Private Auto Accompanied By: son Schedule Follow-up Appointment: Yes Clinical Summary of Care: Electronic Signature(s) Signed: 03/30/2018 4:23:14 PM By: Montey Hora Entered By: Montey Hora on 03/30/2018 16:23:14 [name] [name] ([id number]) -------------------------------------------------------------------------------- Lower Extremity Assessment Details Patient Name: [name] [name]. Date of Service: 03/30/2018 2:00 PM Medical Record Number: [id number] Patient Account Number: [id number] Date of Birth/Sex: Jan 27, 1946 (72 y.o. F) Treating RN: Cornell Barman Primary Care Valene Villa: Delight Stare Other Clinician: Referring Tyreonna Czaplicki: Delight Stare Treating Calub Tarnow/Extender: Melburn Hake, HOYT Weeks in Treatment: 4 Vascular Assessment Pulses: Dorsalis Pedis Palpable: [Right:Yes] Posterior Tibial Extremity colors, hair growth, and conditions: Extremity Color: [Right:Normal] Hair Growth on Extremity: [Right:No] Temperature of Extremity: [Right:Warm] Capillary Refill:  [Right:< 3 seconds] Toe Nail Assessment Left: Right: Thick: No Discolored: No Deformed: No Improper Length and Hygiene: No Electronic Signature(s) Signed: 03/31/2018 1:27:23 PM By: Gretta Cool, BSN, RN, CWS, Kim RN, BSN Entered By: Gretta Cool, BSN, RN, CWS, Kim on 03/30/2018 14:04:24 [name] [name] ([id number]) -------------------------------------------------------------------------------- Multi Wound Chart Details Patient Name: [name] [name]. Date of Service: 03/30/2018 2:00 PM Medical Record Number: [id number] Patient Account Number: [id number] Date of Birth/Sex: Apr 01, 1946 (72 y.o. F) Treating RN: Harold Barban Primary Care Diamond Jentz: Delight Stare Other Clinician: Referring Yareth Kearse: Delight Stare Treating Rontavious Albright/Extender: STONE III, HOYT Weeks in Treatment: 4 Vital Signs Height(in): 67 Pulse(bpm): 74 Weight(lbs): 200 Blood Pressure(mmHg): 135/65 Body Mass Index(BMI): 31 Temperature(F): 98.3 Respiratory Rate 16 (breaths/min): Photos: [N/A:N/A] Wound Location: Right Foot - Plantar N/A N/A Wounding Event: Gradually Appeared N/A N/A Primary Etiology: Diabetic Wound/Ulcer of the N/A N/A Lower Extremity Comorbid History: Cataracts, Chronic sinus N/A N/A problems/congestion, Hypertension, Type II Diabetes Date Acquired: 06/16/2017 N/A N/A Weeks of Treatment: 4 N/A N/A Wound Status: Open N/A N/A Measurements L x W x D 1x0.9x0.2 N/A N/A (cm) Area (cm) : 0.707 N/A N/A Volume (cm) : 0.141 N/A N/A % Reduction in Area: 67.80% N/A N/A % Reduction in Volume: 84.00% N/A N/A Classification: Grade 2 N/A N/A Exudate Amount: Medium N/A N/A Exudate Type: Serosanguineous N/A N/A Exudate Color: red, Born N/A N/A Wound Margin: Thickened N/A N/A Granulation Amount: Medium (34-66%) N/A N/A Granulation Quality: Red N/A N/A Necrotic Amount: Medium (34-66%) N/A N/A Exposed Structures: Fat Layer (Subcutaneous N/A N/A Tissue) Exposed: Yes Fascia: No Tendon: No Muscle: No SAKINAH, ROSAMOND ([id number]) Joint: No Bone: No Epithelialization: Small (1-33%) N/A N/A Periwound Skin Texture: Callus: Yes N/A N/A Excoriation: No Induration: No Crepitus: No Rash: No Scarring: No Periwound Skin Moisture: Maceration: Yes N/A N/A Dry/Scaly: No Periwound Skin Color: Atrophie Blanche: No N/A N/A Cyanosis: No Ecchymosis: No Erythema: No Hemosiderin Staining: No Mottled: No Pallor: No Rubor: No Tenderness on Palpation: No N/A N/A Wound Preparation: Ulcer Cleansing: N/A N/A Rinsed/Irrigated with Saline Topical Anesthetic Applied: Other: lidocaine 4% Treatment Notes Electronic Signature(s) Signed: 03/31/2018 1:37:43 PM By: Harold Barban Entered By: Harold Barban on 03/30/2018 15:18:32 [name] [name] ([id number]) --------------------------------------------------------------------------------  [place] Details Patient Name: TOBIE, PERDUE. Date of Service: 03/30/2018 2:00 PM Medical Record Number: [id number] Patient Account Number: [id number] Date of Birth/Sex: 26-Oct-1945 (72 y.o. F) Treating RN: Harold Barban Primary Care Brya Simerly: Delight Stare Other Clinician: Referring Sante Biedermann: Delight Stare Treating Launi Asencio/Extender: Melburn Hake, HOYT Weeks in Treatment: 4 Active Inactive Pressure Nursing Diagnoses: Knowledge deficit related to causes and risk factors for pressure ulcer development Potential for impaired tissue integrity related to pressure, friction, moisture, and shear Goals: Patient will remain free from development of additional pressure ulcers Date Initiated: 03/02/2018 Target Resolution Date: 05/02/2018 Goal Status: Active Patient/caregiver will verbalize risk factors for pressure ulcer development Date Initiated: 03/02/2018 Target Resolution Date: 04/01/2018 Goal Status: Active Interventions: Assess: immobility, friction, shearing, incontinence upon admission and as needed Assess offloading mechanisms upon admission  and as needed Provide education on pressure ulcers Notes: Wound/Skin Impairment Nursing Diagnoses: Impaired tissue integrity Knowledge deficit related to ulceration/compromised skin integrity Goals: Ulcer/skin breakdown will have a volume reduction of 30% by week 4 Date Initiated: 03/02/2018 Target Resolution Date: 04/01/2018 Goal Status: Active Interventions: Assess patient/caregiver ability to obtain necessary supplies Assess patient/caregiver ability to perform ulcer/skin care regimen upon admission and as needed Assess ulceration(s) every visit Provide education on ulcer and skin care Notes: WILDER, AMODEI ([id number]) Electronic Signature(s) Signed: 03/31/2018 1:37:43 PM By: Harold Barban Entered By: Harold Barban on 03/30/2018 15:18:23 [name] [name] ([id number]) -------------------------------------------------------------------------------- Pain Assessment Details Patient Name: [name] [name]. Date of Service: 03/30/2018 2:00 PM Medical Record Number: [id number] Patient Account Number: [id number] Date of Birth/Sex: 1946-02-01 (72 y.o. F) Treating RN: Cornell Barman Primary Care Bettie Capistran: Delight Stare Other Clinician: Referring Jackolyn Geron: Delight Stare Treating Naida Escalante/Extender: Melburn Hake, HOYT Weeks in Treatment: 4 Active Problems Location of Pain Severity and Description of Pain Patient Has Paino No Site Locations Pain Management and Medication Current Pain Management: Electronic Signature(s) Signed: 03/31/2018 1:27:23 PM By: Gretta Cool, BSN, RN, CWS, Kim RN, BSN Entered By: Gretta Cool, BSN, RN, CWS, Kim on 03/30/2018 13:51:34 [name] [name] ([id number]) -------------------------------------------------------------------------------- Patient/Caregiver Education Details Patient Name: SUNDUS, PETE. Date of Service: 03/30/2018 2:00 PM Medical Record Number: [id number] Patient Account Number: [id number] Date of Birth/Gender: 04-Nov-1945 (72 y.o. F) Treating RN: Harold Barban Primary Care Physician: Delight Stare Other Clinician: Referring Physician: Delight Stare Treating Physician/Extender: Sharalyn Ink in Treatment: 4 Education Assessment Education Provided To: Patient Education Topics Provided Electronic Signature(s) Signed: 03/31/2018 1:37:43 PM By: Harold Barban Entered By: Harold Barban on 03/30/2018 15:19:22 [name] [name] ([id number]) -------------------------------------------------------------------------------- Wound Assessment Details Patient Name: [name] [name]. Date of Service: 03/30/2018 2:00 PM Medical Record Number: [id number] Patient Account Number: [id number] Date of Birth/Sex: 1945/12/13 (72 y.o. F) Treating RN: Cornell Barman Primary Care Edyth Glomb: Delight Stare Other Clinician: Referring Amire Leazer: Delight Stare Treating Magen Suriano/Extender: STONE III, HOYT Weeks in Treatment: 4 Wound Status Wound Number: 1 Primary Diabetic Wound/Ulcer of the Lower Extremity Etiology: Wound Location: Right Foot - Plantar Wound Open Wounding Event: Gradually Appeared Status: Date Acquired: 06/16/2017 Comorbid Cataracts, Chronic sinus problems/congestion, Weeks Of Treatment: 4 History: Hypertension, Type II Diabetes Clustered Wound: No Photos Photo Uploaded By: Gretta Cool, BSN, RN, CWS, Kim on 03/30/2018 14:17:56 Wound Measurements Length: (cm) 1 Width: (cm) 0.9 Depth: (cm) 0.2 Area: (cm) 0.707 Volume: (cm) 0.141 % Reduction in Area: 67.8% % Reduction in Volume: 84% Epithelialization: Small (1-33%) Tunneling: No Undermining: No Wound Description Classification: Grade 2 Foul Odor Wound Margin: Thickened Slough/Fi Exudate Amount: Medium Exudate Type: Serosanguineous Exudate Color: red, [place] After  Cleansing: No brino Yes Wound Bed Granulation Amount: Medium (34-66%) Exposed Structure Granulation Quality: Red Fascia Exposed: No Necrotic Amount: Medium (34-66%) Fat Layer (Subcutaneous Tissue) Exposed: Yes Necrotic  Quality: Adherent Slough Tendon Exposed: No Muscle Exposed: No Joint Exposed: No Bone Exposed: No Periwound Skin Texture CARAL, WHAN. ([id number]) Texture Color No Abnormalities Noted: No No Abnormalities Noted: No Callus: Yes Atrophie Blanche: No Crepitus: No Cyanosis: No Excoriation: No Ecchymosis: No Induration: No Erythema: No Rash: No Hemosiderin Staining: No Scarring: No Mottled: No Pallor: No Moisture Rubor: No No Abnormalities Noted: No Dry / Scaly: No Maceration: Yes Wound Preparation Ulcer Cleansing: Rinsed/Irrigated with Saline Topical Anesthetic Applied: Other: lidocaine 4%, Treatment Notes Wound #1 (Right, Plantar Foot) Notes TAO and bordered foam dressing on the area that was rubbed by the cast on her right shin; silvercel, gauze, felt and conform on wound Electronic Signature(s) Signed: 03/31/2018 1:27:23 PM By: Gretta Cool, BSN, RN, CWS, Kim RN, BSN Entered By: Gretta Cool, BSN, RN, CWS, Kim on 03/30/2018 14:03:43 [name] [name] ([id number]) -------------------------------------------------------------------------------- [place] Details Patient Name: [name] [name]. Date of Service: 03/30/2018 2:00 PM Medical Record Number: [id number] Patient Account Number: [id number] Date of Birth/Sex: Jan 13, 1946 (72 y.o. F) Treating RN: Cornell Barman Primary Care Marijayne Rauth: Delight Stare Other Clinician: Referring Latresha Yahr: Delight Stare Treating Shaleen Talamantez/Extender: Melburn Hake, HOYT Weeks in Treatment: 4 Vital Signs Time Taken: 13:52 Temperature (F): 98.3 Height (in): 67 Pulse (bpm): 74 Weight (lbs): 200 Respiratory Rate (breaths/min): 16 Body Mass Index (BMI): 31.3 Blood Pressure (mmHg): 135/65 Reference Range: 80 - 120 mg / dl Electronic Signature(s) Signed: 03/31/2018 1:27:23 PM By: Gretta Cool, BSN, RN, CWS, Kim RN, BSN Entered By: Gretta Cool, BSN, RN, CWS, Kim on 03/30/2018 13:53:02

## 2018-04-04 NOTE — Progress Notes (Signed)
KIMMBERLY, WISSER ([id number]) Visit Report for 03/30/2018 Chief Complaint Document Details Patient Name: [name], [name]. Date of Service: 03/30/2018 2:00 PM Medical Record Number: [id number] Patient Account Number: [id number] Date of Birth/Sex: 11/11/1945 (72 y.o. F) Treating RN: Harold Barban Primary Care Provider: Delight Stare Other Clinician: Referring Provider: Delight Stare Treating Provider/Extender: Melburn Hake, HOYT Weeks in Treatment: 4 Information Obtained from: Patient Chief Complaint Right foot ulcer Electronic Signature(s) Signed: 04/02/2018 10:16:39 PM By: Worthy Keeler PA-C Entered By: Worthy Keeler on 03/30/2018 13:39:33 [name] [name] ([id number]) -------------------------------------------------------------------------------- HPI Details Patient Name: [name] [name]. Date of Service: 03/30/2018 2:00 PM Medical Record Number: [id number] Patient Account Number: [id number] Date of Birth/Sex: 1946-01-31 (72 y.o. F) Treating RN: Harold Barban Primary Care Provider: Delight Stare Other Clinician: Referring Provider: Delight Stare Treating Provider/Extender: Melburn Hake, HOYT Weeks in Treatment: 4 History of Present Illness HPI Description: 03/02/18 on evaluation today patient presents for initial evaluation and clinic today as a referral from Dr. Sharlotte Alamo who was with [facility]. 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 This is something that he has been managing including the the debridement's which the patient tells me he has been doing during her office visits. She does have a history of diabetes mellitus type II. The dressing of choice as indicated by Dr. Cleda Mccreedy was Bactroban ointment and a sterile bandage. X-rays were deferred at that point as Dr. Cleda Mccreedy did not feel any additional x-rays were necessary. He stated point blank in her note that with the chronic malalignment of the right foot if anything gets deeper  and there is potentially bone infection that her only viable option would be a below the knee amputation. Therefore she was patient has finished to see if there's anything we can do to help. A total contact cast has not been attempted by Dr. Cleda Mccreedy. The last office note that we have from him is actually from 02/12/18. The patient has no significant pain in this region which is good news. No fevers, chills, nausea, or vomiting noted at this time. She does have a history of hypertension. 03/09/18 on evaluation today patient actually appears to be doing much better in regard to her plantar foot wound. This is appears to show signs of improvement as far as the overall size is concerned this is great news. She also seems to have a fairly good appearance to the which is also good news. Overall I'm very pleased with the parents. She does tell me that she's having some issues with transportation she's been in touch with social services. With that being said she states that she's not sure she'd even be able to be here next week. Subsequently she definitely would be able to be here in two days for her first mandatory cast change. Nonetheless it sounds as if we're gonna have to put off applying the total contact cast for the time being. 03/23/18 on evaluation today patient actually appears to be doing about the same in regard to her ulcer. She has been tolerating the dressing changes without complication. With that being said the offloading shoe though it didn't seem to allow for any worsening of the wound in general there still a significant amount of callous buildup unfortunately present at this time. Therefore I do think that she would be a good candidate for the total contact cast which I believe will be a much better option for her. 03/26/18 on evaluation today  patient actually appears to be doing very well in regard to her wound. I do feel like the cast was beneficial for her she had a little bit a slight  rubbing on the anterior portion of her shin but nothing that appeared to be too significant at this point which was good news. Nonetheless I do think that this is doing a good job for her as far as the overall wound is concerned. 03/30/18 on evaluation today patient appears to be doing very well in regard to her plantar foot ulcer. She has tolerated the cast without complication. Unfortunately the big fish you would have is that our shipment of cast came in with the boots only and no cast with them. Therefore we do not have a cast to place on her today. This was discussed with the patient as well. Electronic Signature(s) Signed: 04/02/2018 10:16:39 PM By: Worthy Keeler PA-C Entered By: Worthy Keeler on 04/02/2018 22:13:44 [name] [name] ([id number]) -------------------------------------------------------------------------------- Physical Exam Details Patient Name: [name], [name]. Date of Service: 03/30/2018 2:00 PM Medical Record Number: [id number] Patient Account Number: [id number] Date of Birth/Sex: August 01, 1945 (72 y.o. F) Treating RN: Harold Barban Primary Care Provider: Delight Stare Other Clinician: Referring Provider: Delight Stare Treating Provider/Extender: STONE III, HOYT Weeks in Treatment: 4 Constitutional Well-nourished and well-hydrated in no acute distress. Respiratory normal breathing without difficulty. clear to auscultation bilaterally. Cardiovascular regular rate and rhythm with normal S1, S2. Psychiatric this patient is able to make decisions and demonstrates good insight into disease process. Alert and Oriented x 3. pleasant and cooperative. Notes Patient's wound bed did not show any significant callous buildup she did have fairly good granulation in the base of the wound which is excellent news. Overall I'm very pleased with how the appearance of the wound stands as of today. She did have a little area that is rubbing on the anterior portion of the shin we are  going to put a dressing on this in order to help attack the area and allow it to heal I do not think this is going to open up into a big wound. Electronic Signature(s) Signed: 04/02/2018 10:16:39 PM By: Worthy Keeler PA-C Entered By: Worthy Keeler on 04/02/2018 22:14:20 [name] [name] ([id number]) -------------------------------------------------------------------------------- Physician Orders Details Patient Name: ZAAKIRAH, KISTNER. Date of Service: 03/30/2018 2:00 PM Medical Record Number: [id number] Patient Account Number: [id number] Date of Birth/Sex: [birth year] (72 y.o. F) Treating RN: Harold Barban Primary Care Provider: Delight Stare Other Clinician: Referring Provider: Delight Stare Treating Provider/Extender: Melburn Hake, HOYT Weeks in Treatment: 4 Verbal / Phone Orders: No Diagnosis Coding ICD-10 Coding Code Description E11.621 Type 2 diabetes mellitus with foot ulcer L97.512 Non-pressure chronic ulcer of other part of right foot with fat layer exposed A52.16 Charcot's arthropathy (tabetic) I10 Essential (primary) hypertension L84 Corns and callosities Primary Wound Dressing Wound #1 Right,Plantar Foot o Silver Alginate Secondary Dressing Wound #1 Right,Plantar Foot o Gauze and Kerlix/Conform - Felt callous pad, apply foam padding to chin area Dressing Change Frequency Wound #1 Right,Plantar Foot o Change Dressing Monday, Wednesday, Friday Follow-up Appointments Wound #1 Right,Plantar Foot o Return Appointment in 2 weeks. Off-Loading Wound #1 Right,Plantar Foot o Turn and reposition every 2 hours Home Health Wound #1 [facility] Visits o Home Health Nurse may visit PRN to address patientos wound care needs. o FACE TO FACE ENCOUNTER: MEDICARE and MEDICAID PATIENTS: I certify that this patient is under my care and that I had a  face-to-face encounter that meets the physician face-to-face encounter requirements with  this patient on this date. The encounter with the patient was in whole or in part for the following MEDICAL CONDITION: (primary reason for [place]) MEDICAL NECESSITY: I certify, that based on my findings, NURSING services are a medically necessary home health service. HOME BOUND STATUS: I certify that my clinical findings support that this patient is homebound (i.e., Due to illness or injury, pt requires aid of supportive devices such as crutches, cane, wheelchairs, walkers, the use of special transportation or the assistance of another person to leave their place of residence. There is a normal inability to leave the home [place]. ([id number]) and doing so requires considerable and taxing effort. Other absences are for medical reasons / religious services and are infrequent or of short duration when for other reasons). o If current dressing causes regression in wound condition, may D/C ordered dressing product/s and apply Normal Saline Moist Dressing daily until next [place] / Other MD appointment. [place] of regression in wound condition at [phone]. o Please direct any NON-WOUND related issues/requests for orders to patient's Primary Care Physician Electronic Signature(s) Signed: 03/31/2018 1:37:43 PM By: Harold Barban Signed: 04/02/2018 10:16:39 PM By: Worthy Keeler PA-C Entered By: Harold Barban on 03/31/2018 08:24:22 [name], [name] ([id number]) -------------------------------------------------------------------------------- Problem List Details Patient Name: [name], [name]. Date of Service: 03/30/2018 2:00 PM Medical Record Number: [id number] Patient Account Number: [id number] Date of Birth/Sex: 04/14/1945 (72 y.o. F) Treating RN: Harold Barban Primary Care Provider: Delight Stare Other Clinician: Referring Provider: Delight Stare Treating Provider/Extender: Melburn Hake, HOYT Weeks in Treatment: 4 Active  Problems ICD-10 Evaluated Encounter Code Description Active Date Today Diagnosis E11.621 Type 2 diabetes mellitus with foot ulcer 03/02/2018 No Yes L97.512 Non-pressure chronic ulcer of other part of right foot with fat 03/02/2018 No Yes layer exposed A52.16 Charcot's arthropathy (tabetic) 03/02/2018 No Yes I10 Essential (primary) hypertension 03/02/2018 No Yes L84 Corns and callosities 03/09/2018 No Yes Inactive Problems Resolved Problems Electronic Signature(s) Signed: 04/02/2018 10:16:39 PM By: Worthy Keeler PA-C Entered By: Worthy Keeler on 03/30/2018 13:39:27 [name] [name] ([id number]) -------------------------------------------------------------------------------- Progress Note Details Patient Name: [name] [name]. Date of Service: 03/30/2018 2:00 PM Medical Record Number: [id number] Patient Account Number: [id number] Date of Birth/Sex: 14-Jun-1945 (72 y.o. F) Treating RN: Harold Barban Primary Care Provider: Delight Stare Other Clinician: Referring Provider: Delight Stare Treating Provider/Extender: Melburn Hake, HOYT Weeks in Treatment: 4 Subjective Chief Complaint Information obtained from Patient Right foot ulcer History of Present Illness (HPI) 03/02/18 on evaluation today patient presents for initial evaluation and clinic today as a referral from Dr. Sharlotte Alamo who was with [facility]. 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 This is something that he has been managing including the the debridement's which the patient tells me he has been doing during her office visits. She does have a history of diabetes mellitus type II. The dressing of choice as indicated by Dr. Cleda Mccreedy was Bactroban ointment and a sterile bandage. X-rays were deferred at that point as Dr. Cleda Mccreedy did not feel any additional x-rays were necessary. He stated point blank in her note that with the chronic malalignment of the right foot if anything gets  deeper and there is potentially bone infection that her only viable option would be a below the knee amputation. Therefore she was patient has finished to see if there's anything  we can do to help. A total contact cast has not been attempted by Dr. Cleda Mccreedy. The last office note that we have from him is actually from 02/12/18. The patient has no significant pain in this region which is good news. No fevers, chills, nausea, or vomiting noted at this time. She does have a history of hypertension. 03/09/18 on evaluation today patient actually appears to be doing much better in regard to her plantar foot wound. This is appears to show signs of improvement as far as the overall size is concerned this is great news. She also seems to have a fairly good appearance to the which is also good news. Overall I'm very pleased with the parents. She does tell me that she's having some issues with transportation she's been in touch with social services. With that being said she states that she's not sure she'd even be able to be here next week. Subsequently she definitely would be able to be here in two days for her first mandatory cast change. Nonetheless it sounds as if we're gonna have to put off applying the total contact cast for the time being. 03/23/18 on evaluation today patient actually appears to be doing about the same in regard to her ulcer. She has been tolerating the dressing changes without complication. With that being said the offloading shoe though it didn't seem to allow for any worsening of the wound in general there still a significant amount of callous buildup unfortunately present at this time. Therefore I do think that she would be a good candidate for the total contact cast which I believe will be a much better option for her. 03/26/18 on evaluation today patient actually appears to be doing very well in regard to her wound. I do feel like the cast was beneficial for her she had a little bit a  slight rubbing on the anterior portion of her shin but nothing that appeared to be too significant at this point which was good news. Nonetheless I do think that this is doing a good job for her as far as the overall wound is concerned. 03/30/18 on evaluation today patient appears to be doing very well in regard to her plantar foot ulcer. She has tolerated the cast without complication. Unfortunately the big fish you would have is that our shipment of cast came in with the boots only and no cast with them. Therefore we do not have a cast to place on her today. This was discussed with the patient as well. Patient History Information obtained from Patient. Family History Diabetes - Mother,Father, No family history of Cancer, Heart Disease, Hereditary Spherocytosis, Hypertension, Kidney Disease, Lung Disease, LEOLA, FIORE. ([id number]) Seizures, Stroke, Thyroid Problems, Tuberculosis. Social History Never smoker, Alcohol Use - Never, Drug Use - No History, Caffeine Use - Never. Review of Systems (ROS) Constitutional Symptoms (General Health) Denies complaints or symptoms of Fever, Chills. Respiratory The patient has no complaints or symptoms. Cardiovascular The patient has no complaints or symptoms. Psychiatric The patient has no complaints or symptoms. Objective Constitutional Well-nourished and well-hydrated in no acute distress. Vitals Time Taken: 1:52 PM, Height: 67 in, Weight: 200 lbs, BMI: 31.3, Temperature: 98.3 F, Pulse: 74 bpm, Respiratory Rate: 16 breaths/min, Blood Pressure: 135/65 mmHg. Respiratory normal breathing without difficulty. clear to auscultation bilaterally. Cardiovascular regular rate and rhythm with normal S1, S2. Psychiatric this patient is able to make decisions and demonstrates good insight into disease process. Alert and Oriented x 3. pleasant and cooperative. General  Notes: Patient's wound bed did not show any significant callous buildup she did have  fairly good granulation in the base of the wound which is excellent news. Overall I'm very pleased with how the appearance of the wound stands as of today. She did have a little area that is rubbing on the anterior portion of the shin we are going to put a dressing on this in order to help attack the area and allow it to heal I do not think this is going to open up into a big wound. Integumentary (Hair, Skin) Wound #1 status is Open. Original cause of wound was Gradually Appeared. The wound is located on the [place]. The wound measures 1cm length x 0.9cm width x 0.2cm depth; 0.707cm^2 area and 0.141cm^3 volume. There is Fat Layer (Subcutaneous Tissue) Exposed exposed. There is no tunneling or undermining noted. There is a medium amount of serosanguineous drainage noted. The wound margin is thickened. There is medium (34-66%) red granulation within the wound bed. There is a medium (34-66%) amount of necrotic tissue within the wound bed including Adherent Slough. The periwound skin appearance exhibited: Callus, Maceration. The periwound skin appearance did not exhibit: Crepitus, Excoriation, Induration, Rash, Scarring, Dry/Scaly, Atrophie Blanche, Cyanosis, Ecchymosis, Hemosiderin Staining, Mottled, Pallor, Rubor, Erythema. [name], [name] ([id number]) Assessment Active Problems ICD-10 Type 2 diabetes mellitus with foot ulcer Non-pressure chronic ulcer of other part of right foot with fat layer exposed Charcot's arthropathy (tabetic) Essential (primary) hypertension Corns and callosities Plan Primary Wound Dressing: Wound #1 Right,Plantar Foot: Silver Alginate Secondary Dressing: Wound #1 Right,Plantar Foot: Gauze and Kerlix/Conform - Felt callous pad, apply foam padding to chin area Dressing Change Frequency: Wound #1 Right,Plantar Foot: Change Dressing Monday, Wednesday, Friday Follow-up Appointments: Wound #1 Right,Plantar Foot: Return Appointment in 2  weeks. Off-Loading: Wound #1 Right,Plantar Foot: Turn and reposition every 2 hours Home Health: Wound #1 Right,Plantar Foot: [place] Nurse may visit PRN to address patient s wound care needs. FACE TO FACE ENCOUNTER: MEDICARE and MEDICAID PATIENTS: I certify that this patient is under my care and that I had a face-to-face encounter that meets the physician face-to-face encounter requirements with this patient on this date. The encounter with the patient was in whole or in part for the following MEDICAL CONDITION: (primary reason for [place]) MEDICAL NECESSITY: I certify, that based on my findings, NURSING services are a medically necessary home health service. HOME BOUND STATUS: I certify that my clinical findings support that this patient is homebound (i.e., Due to illness or injury, pt requires aid of supportive devices such as crutches, cane, wheelchairs, walkers, the use of special transportation or the assistance of another person to leave their place of residence. There is a normal inability to leave the home and doing so requires considerable and taxing effort. Other absences are for medical reasons / religious services and are infrequent or of short duration when for other reasons). If current dressing causes regression in wound condition, may D/C ordered dressing product/s and apply Normal Saline Moist Dressing daily until next [place] / Other MD appointment. [place] of regression in wound condition at [phone]. Please direct any NON-WOUND related issues/requests for orders to patient's Primary Care Physician [name], [name] ([id number]) We will continue with the above wound care measures for the next week. She's in agreement the plan. Again we're not initiating the cast today simply due to the fact that we do not have the cast available.  We will therefore see her back for reevaluation in two weeks. Please  see above for specific wound care orders. We will see patient for re-evaluation in 2 week(s) here in the clinic. If anything worsens or changes patient will contact our office for additional recommendations. Electronic Signature(s) Signed: 04/02/2018 10:16:39 PM By: Worthy Keeler PA-C Entered By: Worthy Keeler on 04/02/2018 22:14:32 [name] [name] ([id number]) -------------------------------------------------------------------------------- ROS/PFSH Details Patient Name: [name], [name]. Date of Service: 03/30/2018 2:00 PM Medical Record Number: [id number] Patient Account Number: [id number] Date of Birth/Sex: 10-21-1945 (72 y.o. F) Treating RN: Harold Barban Primary Care Provider: Delight Stare Other Clinician: Referring Provider: Delight Stare Treating Provider/Extender: Melburn Hake, HOYT Weeks in Treatment: 4 Information Obtained From Patient Wound History Do you currently have one or more open woundso No Have you tested positive for osteomyelitis (bone infection)o No Have you had any tests for circulation on your legso No Constitutional Symptoms (General Health) Complaints and Symptoms: Negative for: Fever; Chills Eyes Medical History: Positive for: Cataracts - od Negative for: Glaucoma; Optic Neuritis Ear/Nose/Mouth/Throat Medical History: Positive for: Chronic sinus problems/congestion Negative for: Middle ear problems Hematologic/Lymphatic Medical History: Negative for: Anemia; Hemophilia; Human Immunodeficiency Virus; Lymphedema; Sickle Cell Disease Respiratory Complaints and Symptoms: No Complaints or Symptoms Medical History: Negative for: Aspiration; Asthma; Chronic Obstructive Pulmonary Disease (COPD); Pneumothorax; Sleep Apnea; Tuberculosis Cardiovascular Complaints and Symptoms: No Complaints or Symptoms Medical History: Positive for: Hypertension Negative for: Angina; Arrhythmia; Congestive Heart Failure; Coronary Artery Disease; Deep Vein Thrombosis;  Hypotension; Myocardial Infarction; Peripheral Arterial Disease; Peripheral Venous Disease; Phlebitis; Vasculitis Gastrointestinal [name], [name] ([id number]) Medical History: Negative for: Cirrhosis ; Colitis; Crohnos; Hepatitis A; Hepatitis B; Hepatitis C Endocrine Medical History: Positive for: Type II Diabetes Negative for: Type I Diabetes Time with diabetes: 1997 Treated with: Oral agents, Diet Blood sugar tested every day: Yes Tested : once a day Genitourinary Medical History: Negative for: End Stage Renal Disease Immunological Medical History: Negative for: Lupus Erythematosus; Raynaudos; Scleroderma Integumentary (Skin) Medical History: Negative for: History of Burn; History of pressure wounds Musculoskeletal Medical History: Negative for: Gout; Rheumatoid Arthritis; Osteoarthritis; Osteomyelitis Neurologic Medical History: Negative for: Dementia; Neuropathy; Quadriplegia; Paraplegia; Seizure Disorder Oncologic Medical History: Negative for: Received Chemotherapy; Received Radiation Psychiatric Complaints and Symptoms: No Complaints or Symptoms Medical History: Negative for: Anorexia/bulimia; Confinement Anxiety HBO Extended History Items Eyes: Ear/Nose/Mouth/Throat: Cataracts Chronic sinus problems/congestion Immunizations Pneumococcal Vaccine: Received Pneumococcal Vaccination: No [name], [name] ([id number]) Implantable Devices Family and Social History Cancer: No; Diabetes: Yes - Mother,Father; Heart Disease: No; Hereditary Spherocytosis: No; Hypertension: No; Kidney Disease: No; Lung Disease: No; Seizures: No; Stroke: No; Thyroid Problems: No; Tuberculosis: No; Never smoker; Alcohol Use: Never; Drug Use: No History; Caffeine Use: Never; Financial Concerns: No; Food, Clothing or Shelter Needs: No; Transportation Concerns: No; Advanced Directives: No; Patient wants information on Advanced Directives; Advanced Directives information was provided to  patient; Do not resuscitate: No; Living Will: No; Medical Power of Attorney: No Physician Affirmation I have reviewed and agree with the above information. Electronic Signature(s) Signed: 04/02/2018 10:16:39 PM By: Worthy Keeler PA-C Signed: 04/03/2018 5:10:00 PM By: Harold Barban Entered By: Worthy Keeler on 04/02/2018 22:13:59 [name] [name] ([id number]) -------------------------------------------------------------------------------- SuperBill Details Patient Name: [name], [name]. Date of Service: 03/30/2018 Medical Record Number: [id number] Patient Account Number: [id number] Date of Birth/Sex: [birth year] (72 y.o. F) Treating RN: Harold Barban Primary Care Provider: Delight Stare Other Clinician: Referring Provider: Delight Stare Treating Provider/Extender: STONE III, HOYT Weeks in Treatment: 4 Diagnosis  Coding ICD-10 Codes Code Description E11.621 Type 2 diabetes mellitus with foot ulcer L97.512 Non-pressure chronic ulcer of other part of right foot with fat layer exposed A52.16 Charcot's arthropathy (tabetic) I10 Essential (primary) hypertension L84 Corns and callosities Facility Procedures CPT4 Code: [id number] Description: 99213 - WOUND CARE VISIT-LEV 3 EST PT Modifier: Quantity: 1 Physician Procedures CPT4 Code: [id number] Description: 85462 - WC PHYS LEVEL 4 - EST PT ICD-10 Diagnosis Description 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

## 2018-04-13 ENCOUNTER — Ambulatory Visit: Payer: Medicare Other | Admitting: Physician Assistant

## 2018-04-13 ENCOUNTER — Encounter: Payer: Medicare HMO | Attending: Physician Assistant | Admitting: Physician Assistant

## 2018-04-13 DIAGNOSIS — E11610 Type 2 diabetes mellitus with diabetic neuropathic arthropathy: Secondary | ICD-10-CM | POA: Diagnosis not present

## 2018-04-13 DIAGNOSIS — L97512 Non-pressure chronic ulcer of other part of right foot with fat layer exposed: Secondary | ICD-10-CM | POA: Insufficient documentation

## 2018-04-13 DIAGNOSIS — I10 Essential (primary) hypertension: Secondary | ICD-10-CM | POA: Diagnosis not present

## 2018-04-13 DIAGNOSIS — E11621 Type 2 diabetes mellitus with foot ulcer: Secondary | ICD-10-CM | POA: Diagnosis present

## 2018-04-13 DIAGNOSIS — L84 Corns and callosities: Secondary | ICD-10-CM | POA: Insufficient documentation

## 2018-04-17 NOTE — Progress Notes (Addendum)
JAMONI, BROADFOOT ([id number]) Visit Report for 04/13/2018 Chief Complaint Document Details Patient Name: [name] [name], [name] [name]. Date of Service: 04/13/2018 12:45 PM Medical Record Number: [id number] Patient Account Number: [id number] Date of Birth/Sex: April 07, 1946 (73 y.o. F) Treating RN: Harold Barban Primary Care Provider: Delight Stare Other Clinician: Referring Provider: Delight Stare Treating Provider/Extender: Melburn Hake, Kamarius Buckbee Weeks in Treatment: 6 Information Obtained from: Patient Chief Complaint Right foot ulcer Electronic Signature(s) Signed: 04/14/2018 7:11:38 AM By: Worthy Keeler PA-C Entered By: Worthy Keeler on 04/13/2018 12:58:02 [name] ([id number]) -------------------------------------------------------------------------------- Debridement Details Patient Name: [name]. Date of Service: 04/13/2018 12:45 PM Medical Record Number: [id number] Patient Account Number: [id number] Date of Birth/Sex: July 09, 1945 (73 y.o. F) Treating RN: Harold Barban Primary Care Provider: Delight Stare Other Clinician: Referring Provider: Delight Stare Treating Provider/Extender: STONE III, Wava Kildow Weeks in Treatment: 6 Debridement Performed for Wound #1 Right,Plantar Foot Assessment: Performed By: Physician STONE III, Mickelle Goupil E., PA-C Debridement Type: Debridement Severity of Tissue Pre Fat layer exposed Debridement: Level of Consciousness (Pre- Awake and Alert procedure): Pre-procedure Verification/Time Yes - 13:33 Out Taken: Start Time: 13:33 Pain Control: Lidocaine Total Area Debrided (L x W): 0.9 (cm) x 0.8 (cm) = 0.72 (cm) Tissue and other material Viable, Non-Viable, Callus, Slough, Subcutaneous, Slough debrided: Level: Skin/Subcutaneous Tissue Debridement Description: Excisional Instrument: Curette Bleeding: Minimum Hemostasis Achieved: Pressure End Time: 13:38 Procedural Pain: 0 Post Procedural Pain: 0 Response to Treatment: Procedure was tolerated well Level of  Consciousness Awake and Alert (Post-procedure): Post Debridement Measurements of Total Wound Length: (cm) 0.9 Width: (cm) 0.8 Depth: (cm) 0.4 Volume: (cm) 0.226 Character of Wound/Ulcer Post Debridement: Improved Severity of Tissue Post Debridement: Fat layer exposed Post Procedure Diagnosis Same as Pre-procedure Electronic Signature(s) Signed: 04/14/2018 7:11:38 AM By: Worthy Keeler PA-C Signed: 04/16/2018 2:41:13 PM By: Harold Barban Entered By: Worthy Keeler on 04/14/2018 06:24:51 [name] ([id number]) -------------------------------------------------------------------------------- HPI Details Patient Name: [name]. Date of Service: 04/13/2018 12:45 PM Medical Record Number: [id number] Patient Account Number: [id number] Date of Birth/Sex: Dec 25, 1945 (73 y.o. F) Treating RN: Harold Barban Primary Care Provider: Delight Stare Other Clinician: Referring Provider: Delight Stare Treating Provider/Extender: Melburn Hake, Ferron Ishmael Weeks in Treatment: 6 History of Present Illness HPI Description: 03/02/18 on evaluation today patient presents for initial evaluation and clinic today as a referral from Dr. Sharlotte Alamo who was with [facility]. 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 This is something that he has been managing including the the debridement's which the patient tells me he has been doing during her office visits. She does have a history of diabetes mellitus type II. The dressing of choice as indicated by Dr. Cleda Mccreedy was Bactroban ointment and a sterile bandage. X-rays were deferred at that point as Dr. Cleda Mccreedy did not feel any additional x-rays were necessary. He stated point blank in her note that with the chronic malalignment of the right foot if anything gets deeper and there is potentially bone infection that her only viable option would be a below the knee amputation. Therefore she was patient has finished to see if  there's anything we can do to help. A total contact cast has not been attempted by Dr. Cleda Mccreedy. The last office note that we have from him is actually from 02/12/18. The patient has no significant pain in this region which is good news. No fevers, chills, nausea, or vomiting noted at this time. She does have a  history of hypertension. 03/09/18 on evaluation today patient actually appears to be doing much better in regard to her plantar foot wound. This is appears to show signs of improvement as far as the overall size is concerned this is great news. She also seems to have a fairly good appearance to the which is also good news. Overall I'm very pleased with the parents. She does tell me that she's having some issues with transportation she's been in touch with social services. With that being said she states that she's not sure she'd even be able to be here next week. Subsequently she definitely would be able to be here in two days for her first mandatory cast change. Nonetheless it sounds as if we're gonna have to put off applying the total contact cast for the time being. 03/23/18 on evaluation today patient actually appears to be doing about the same in regard to her ulcer. She has been tolerating the dressing changes without complication. With that being said the offloading shoe though it didn't seem to allow for any worsening of the wound in general there still a significant amount of callous buildup unfortunately present at this time. Therefore I do think that she would be a good candidate for the total contact cast which I believe will be a much better option for her. 03/26/18 on evaluation today patient actually appears to be doing very well in regard to her wound. I do feel like the cast was beneficial for her she had a little bit a slight rubbing on the anterior portion of her shin but nothing that appeared to be too significant at this point which was good news. Nonetheless I do think that  this is doing a good job for her as far as the overall wound is concerned. 03/30/18 on evaluation today patient appears to be doing very well in regard to her plantar foot ulcer. She has tolerated the cast without complication. Unfortunately the issue is that our shipment of cast came in with the boots only and no cast with them. Therefore we do not have a cast to place on her today. This was discussed with the patient as well. 04/13/18 on evaluation today patient actually appears to be doing about the same in regard to her foot ulcer. She has continued to utilize the offloading shoe although we really need to get her back into the total contact cast. We have still been dealing with an shipment of cast which have not arrived at this time. It was supposed to show up this morning but apparently is delayed by day. Nonetheless this is something that we need to get back on her as quickly as possible in my pinion. Electronic Signature(s) Signed: 04/26/2018 1:38:17 AM By: Worthy Keeler PA-C Previous Signature: 04/14/2018 7:11:38 AM Version By: Worthy Keeler PA-C Entered By: Worthy Keeler on 04/24/2018 08:42:08 AIRLIE, BLUMENBERG ([id number], LEMAR ([id number]) -------------------------------------------------------------------------------- Physical Exam Details Patient Name: ALEJAH, ARISTIZABAL. Date of Service: 04/13/2018 12:45 PM Medical Record Number: [id number] Patient Account Number: [id number] Date of Birth/Sex: 10-16-1945 (73 y.o. F) Treating RN: Harold Barban Primary Care Provider: Delight Stare Other Clinician: Referring Provider: Delight Stare Treating Provider/Extender: STONE III, Dennie Vecchio Weeks in Treatment: 6 Constitutional Well-nourished and well-hydrated in no acute distress. Respiratory normal breathing without difficulty. clear to auscultation bilaterally. Cardiovascular regular rate and rhythm with normal S1, S2. Psychiatric this patient is able to make decisions and  demonstrates good insight into disease process. Alert and Oriented x  3. pleasant and cooperative. Notes At this point sharp debridement was performed of the patient's foot wound which she tolerated without complication. Post debridement the wound bed appears to be doing much better. Subsequently I did go ahead and recommend that we see her back next week where I plan to put the contact cast back on her. She's in agreement with this plan. I think that can help this area to heal it seems to be doing so well even without the cast with the cast I think it will do excellent. Electronic Signature(s) Signed: 04/14/2018 7:11:38 AM By: Worthy Keeler PA-C Entered By: Worthy Keeler on 04/14/2018 06:23:47 [name] ([id number]) -------------------------------------------------------------------------------- Physician Orders Details Patient Name: AASIYA, CREASEY. Date of Service: 04/13/2018 12:45 PM Medical Record Number: [id number] Patient Account Number: [id number] Date of Birth/Sex: Apr 23, 1945 (73 y.o. F) Treating RN: Harold Barban Primary Care Provider: Delight Stare Other Clinician: Referring Provider: Delight Stare Treating Provider/Extender: Melburn Hake, Vollie Brunty Weeks in Treatment: 6 Verbal / Phone Orders: No Diagnosis Coding ICD-10 Coding Code Description E11.621 Type 2 diabetes mellitus with foot ulcer L97.512 Non-pressure chronic ulcer of other part of right foot with fat layer exposed A52.16 Charcot's arthropathy (tabetic) I10 Essential (primary) hypertension L84 Corns and callosities Wound Cleansing o Cleanse wound with mild soap and water Primary Wound Dressing Wound #1 Right,Plantar Foot o Silver Alginate Wound #2 Right,Anterior Lower Leg o Silver Collagen Secondary Dressing Wound #1 Right,Plantar Foot o Gauze and Kerlix/Conform - Felt callous pad Wound #2 Right,Anterior Lower Leg o Boardered Foam Dressing Dressing Change Frequency Wound #1 Right,Plantar  Foot o Change Dressing Monday, Wednesday, Friday Follow-up Appointments Wound #1 Right,Plantar Foot o Return Appointment in 1 week. - Bring Boot to next appointment Off-Loading Wound #1 Right,Plantar Foot o Turn and reposition every 2 hours [place] #1 [facility] Visits o Home Health Nurse may visit PRN to address patientos wound care needs. MATTALYN, ANDEREGG ([id number]) o FACE TO FACE ENCOUNTER: MEDICARE and MEDICAID PATIENTS: I certify that this patient is under my care and that I had a face-to-face encounter that meets the physician face-to-face encounter requirements with this patient on this date. The encounter with the patient was in whole or in part for the following MEDICAL CONDITION: (primary reason for [place]) MEDICAL NECESSITY: I certify, that based on my findings, NURSING services are a medically necessary home health service. HOME BOUND STATUS: I certify that my clinical findings support that this patient is homebound (i.e., Due to illness or injury, pt requires aid of supportive devices such as crutches, cane, wheelchairs, walkers, the use of special transportation or the assistance of another person to leave their place of residence. There is a normal inability to leave the home and doing so requires considerable and taxing effort. Other absences are for medical reasons / religious services and are infrequent or of short duration when for other reasons). o If current dressing causes regression in wound condition, may D/C ordered dressing product/s and apply Normal Saline Moist Dressing daily until next [place] / Other MD appointment. [place] of regression in wound condition at [phone]. o Please direct any NON-WOUND related issues/requests for orders to patient's Primary Care Physician Electronic Signature(s) Signed: 04/14/2018 7:11:38 AM By: Worthy Keeler PA-C Signed:  04/16/2018 2:41:13 PM By: Harold Barban Entered By: Harold Barban on 04/13/2018 13:43:06 [name] ([id number]) -------------------------------------------------------------------------------- Problem List Details Patient Name: CABRINA, SHIROMA. Date of Service: 04/13/2018  12:45 PM Medical Record Number: [id number] Patient Account Number: [id number] Date of Birth/Sex: 1945/05/15 (73 y.o. F) Treating RN: Harold Barban Primary Care Provider: Delight Stare Other Clinician: Referring Provider: Delight Stare Treating Provider/Extender: Melburn Hake, Marek Nghiem Weeks in Treatment: 6 Active Problems ICD-10 Evaluated Encounter Code Description Active Date Today Diagnosis E11.621 Type 2 diabetes mellitus with foot ulcer 03/02/2018 No Yes L97.512 Non-pressure chronic ulcer of other part of right foot with fat 03/02/2018 No Yes layer exposed A52.16 Charcot's arthropathy (tabetic) 03/02/2018 No Yes I10 Essential (primary) hypertension 03/02/2018 No Yes L84 Corns and callosities 03/09/2018 No Yes Inactive Problems Resolved Problems Electronic Signature(s) Signed: 04/14/2018 7:11:38 AM By: Worthy Keeler PA-C Entered By: Worthy Keeler on 04/13/2018 12:57:08 [name] ([id number]) -------------------------------------------------------------------------------- Progress Note Details Patient Name: [name]. Date of Service: 04/13/2018 12:45 PM Medical Record Number: [id number] Patient Account Number: [id number] Date of Birth/Sex: 19-Nov-1945 (73 y.o. F) Treating RN: Harold Barban Primary Care Provider: Delight Stare Other Clinician: Referring Provider: Delight Stare Treating Provider/Extender: Melburn Hake, Dajae Kizer Weeks in Treatment: 6 Subjective Chief Complaint Information obtained from Patient Right foot ulcer History of Present Illness (HPI) 03/02/18 on evaluation today patient presents for initial evaluation and clinic today as a referral from Dr. Sharlotte Alamo who was with [facility]. 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 This is something that he has been managing including the the debridement's which the patient tells me he has been doing during her office visits. She does have a history of diabetes mellitus type II. The dressing of choice as indicated by Dr. Cleda Mccreedy was Bactroban ointment and a sterile bandage. X-rays were deferred at that point as Dr. Cleda Mccreedy did not feel any additional x-rays were necessary. He stated point blank in her note that with the chronic malalignment of the right foot if anything gets deeper and there is potentially bone infection that her only viable option would be a below the knee amputation. Therefore she was patient has finished to see if there's anything we can do to help. A total contact cast has not been attempted by Dr. Cleda Mccreedy. The last office note that we have from him is actually from 02/12/18. The patient has no significant pain in this region which is good news. No fevers, chills, nausea, or vomiting noted at this time. She does have a history of hypertension. 03/09/18 on evaluation today patient actually appears to be doing much better in regard to her plantar foot wound. This is appears to show signs of improvement as far as the overall size is concerned this is great news. She also seems to have a fairly good appearance to the which is also good news. Overall I'm very pleased with the parents. She does tell me that she's having some issues with transportation she's been in touch with social services. With that being said she states that she's not sure she'd even be able to be here next week. Subsequently she definitely would be able to be here in two days for her first mandatory cast change. Nonetheless it sounds as if we're gonna have to put off applying the total contact cast for the time being. 03/23/18 on evaluation today patient actually appears to be doing about the same in regard  to her ulcer. She has been tolerating the dressing changes without complication. With that being said the offloading shoe though it didn't seem to allow for any worsening of the wound in  general there still a significant amount of callous buildup unfortunately present at this time. Therefore I do think that she would be a good candidate for the total contact cast which I believe will be a much better option for her. 03/26/18 on evaluation today patient actually appears to be doing very well in regard to her wound. I do feel like the cast was beneficial for her she had a little bit a slight rubbing on the anterior portion of her shin but nothing that appeared to be too significant at this point which was good news. Nonetheless I do think that this is doing a good job for her as far as the overall wound is concerned. 03/30/18 on evaluation today patient appears to be doing very well in regard to her plantar foot ulcer. She has tolerated the cast without complication. Unfortunately the issue is that our shipment of cast came in with the boots only and no cast with them. Therefore we do not have a cast to place on her today. This was discussed with the patient as well. 04/13/18 on evaluation today patient actually appears to be doing about the same in regard to her foot ulcer. She has continued to utilize the offloading shoe although we really need to get her back into the total contact cast. We have still been dealing with an shipment of cast which have not arrived at this time. It was supposed to show up this morning but apparently is delayed by day. Nonetheless this is something that we need to get back on her as quickly as possible in my pinion. Patient History CELINES, FEMIA ([id number]) Information obtained from Patient. Family History Diabetes - Mother,Father, No family history of Cancer, Heart Disease, Hereditary Spherocytosis, Hypertension, Kidney Disease, Lung Disease, Seizures, Stroke,  Thyroid Problems, Tuberculosis. Social History Never smoker, Alcohol Use - Never, Drug Use - No History, Caffeine Use - Never. Review of Systems (ROS) Constitutional Symptoms (General Health) Denies complaints or symptoms of Fever, Chills. Respiratory The patient has no complaints or symptoms. Cardiovascular The patient has no complaints or symptoms. Psychiatric The patient has no complaints or symptoms. Objective Constitutional Well-nourished and well-hydrated in no acute distress. Vitals Time Taken: 12:53 PM, Height: 67 in, Weight: 200 lbs, BMI: 31.3, Temperature: 97.8 F, Pulse: 52 bpm, Respiratory Rate: 16 breaths/min, Blood Pressure: 149/60 mmHg. Respiratory normal breathing without difficulty. clear to auscultation bilaterally. Cardiovascular regular rate and rhythm with normal S1, S2. Psychiatric this patient is able to make decisions and demonstrates good insight into disease process. Alert and Oriented x 3. pleasant and cooperative. General Notes: At this point sharp debridement was performed of the patient's foot wound which she tolerated without complication. Post debridement the wound bed appears to be doing much better. Subsequently I did go ahead and recommend that we see her back next week where I plan to put the contact cast back on her. She's in agreement with this plan. I think that can help this area to heal it seems to be doing so well even without the cast with the cast I think it will do excellent. Integumentary (Hair, Skin) Wound #1 status is Open. Original cause of wound was Gradually Appeared. The wound is located on the [place]. The wound measures 0.9cm length x 0.8cm width x 0.4cm depth; 0.565cm^2 area and 0.226cm^3 volume. There is Fat Layer (Subcutaneous Tissue) Exposed exposed. There is no tunneling or undermining noted. There is a medium amount of serosanguineous drainage noted. The wound margin is thickened.  There is medium (34-66%) red  granulation within the [place], [place] F. ([id number]) wound bed. There is a medium (34-66%) amount of necrotic tissue within the wound bed including Adherent Slough. The periwound skin appearance exhibited: Callus, Maceration. The periwound skin appearance did not exhibit: Crepitus, Excoriation, Induration, Rash, Scarring, Dry/Scaly, Atrophie Blanche, Cyanosis, Ecchymosis, Hemosiderin Staining, Mottled, Pallor, Rubor, Erythema. Wound #2 status is Open. Original cause of wound was Pressure Injury. The wound is located on the Right,Anterior Lower Leg. The wound measures 0.7cm length x 1.1cm width x 0.1cm depth; 0.605cm^2 area and 0.06cm^3 volume. There is no tunneling or undermining noted. There is a small amount of serous drainage noted. There is medium (34-66%) granulation within the wound bed. There is a medium (34-66%) amount of necrotic tissue within the wound bed including Adherent Slough. The periwound skin appearance did not exhibit: Callus, Crepitus, Excoriation, Induration, Rash, Scarring, Dry/Scaly, Maceration, Atrophie Blanche, Cyanosis, Ecchymosis, Hemosiderin Staining, Mottled, Pallor, Rubor, Erythema. Assessment Active Problems ICD-10 Type 2 diabetes mellitus with foot ulcer Non-pressure chronic ulcer of other part of right foot with fat layer exposed Charcot's arthropathy (tabetic) Essential (primary) hypertension Corns and callosities Procedures Wound #1 Pre-procedure diagnosis of Wound #1 is a Diabetic Wound/Ulcer of the Lower Extremity located on the [place] .Severity of Tissue Pre Debridement is: Fat layer exposed. There was a Excisional Skin/Subcutaneous Tissue Debridement with a total area of 0.72 sq cm performed by STONE III, Collyn Ribas E., PA-C. With the following instrument(s): Curette to remove Viable and Non-Viable tissue/material. Material removed includes Callus, Subcutaneous Tissue, and Slough after achieving pain control using Lidocaine. No specimens were  taken. A time out was conducted at 13:33, prior to the start of the procedure. A Minimum amount of bleeding was controlled with Pressure. The procedure was tolerated well with a pain level of 0 throughout and a pain level of 0 following the procedure. Post Debridement Measurements: 0.9cm length x 0.8cm width x 0.4cm depth; 0.226cm^3 volume. Character of Wound/Ulcer Post Debridement is improved. Severity of Tissue Post Debridement is: Fat layer exposed. Post procedure Diagnosis Wound #1: Same as Pre-Procedure Plan Wound Cleansing: Cleanse wound with mild soap and water Primary Wound Dressing: Wound #1 Right,Plantar Foot: Silver Alginate Wound #2 Right,Anterior Lower Leg: LETTI, TOWELL ([id number]) Silver Collagen Secondary Dressing: Wound #1 Right,Plantar Foot: Gauze and Kerlix/Conform - Felt callous pad Wound #2 Right,Anterior Lower Leg: Boardered Foam Dressing Dressing Change Frequency: Wound #1 Right,Plantar Foot: Change Dressing Monday, Wednesday, Friday Follow-up Appointments: Wound #1 Right,Plantar Foot: Return Appointment in 1 week. - Bring Boot to next appointment Off-Loading: Wound #1 Right,Plantar Foot: Turn and reposition every 2 hours Home Health: Wound #1 Right,Plantar Foot: [place] Nurse may visit PRN to address patient s wound care needs. FACE TO FACE ENCOUNTER: MEDICARE and MEDICAID PATIENTS: I certify that this patient is under my care and that I had a face-to-face encounter that meets the physician face-to-face encounter requirements with this patient on this date. The encounter with the patient was in whole or in part for the following MEDICAL CONDITION: (primary reason for [place]) MEDICAL NECESSITY: I certify, that based on my findings, NURSING services are a medically necessary home health service. HOME BOUND STATUS: I certify that my clinical findings support that this patient is homebound (i.e., Due to illness or  injury, pt requires aid of supportive devices such as crutches, cane, wheelchairs, walkers, the use of special transportation or the assistance of another person to leave their place of  residence. There is a normal inability to leave the home and doing so requires considerable and taxing effort. Other absences are for medical reasons / religious services and are infrequent or of short duration when for other reasons). If current dressing causes regression in wound condition, may D/C ordered dressing product/s and apply Normal Saline Moist Dressing daily until next [place] / Other MD appointment. [place] of regression in wound condition at [phone]. Please direct any NON-WOUND related issues/requests for orders to patient's Primary Care Physician I'm in a recommend currently that we continue with the above wound care measures for the next week and we will plan to apply the cast next week. We should definitely have these back from the supplier by that time. The patient is in agreement with plan. Please see above for specific wound care orders. We will see patient for re-evaluation in 1 week(s) here in the clinic. If anything worsens or changes patient will contact our office for additional recommendations. Electronic Signature(s) Signed: 04/26/2018 1:38:17 AM By: Worthy Keeler PA-C Previous Signature: 04/14/2018 7:11:38 AM Version By: Worthy Keeler PA-C Entered By: Worthy Keeler on 04/24/2018 08:42:25 [name] ([id number]) -------------------------------------------------------------------------------- ROS/PFSH Details Patient Name: JERMANY, SUNDELL. Date of Service: 04/13/2018 12:45 PM Medical Record Number: [id number] Patient Account Number: [id number] Date of Birth/Sex: 10/11/1945 (73 y.o. F) Treating RN: Harold Barban Primary Care Provider: Delight Stare Other Clinician: Referring Provider: Delight Stare Treating Provider/Extender: Melburn Hake,  Ellionna Buckbee Weeks in Treatment: 6 Information Obtained From Patient Wound History Do you currently have one or more open woundso No Have you tested positive for osteomyelitis (bone infection)o No Have you had any tests for circulation on your legso No Constitutional Symptoms (General Health) Complaints and Symptoms: Negative for: Fever; Chills Eyes Medical History: Positive for: Cataracts - od Negative for: Glaucoma; Optic Neuritis Ear/Nose/Mouth/Throat Medical History: Positive for: Chronic sinus problems/congestion Negative for: Middle ear problems Hematologic/Lymphatic Medical History: Negative for: Anemia; Hemophilia; Human Immunodeficiency Virus; Lymphedema; Sickle Cell Disease Respiratory Complaints and Symptoms: No Complaints or Symptoms Medical History: Negative for: Aspiration; Asthma; Chronic Obstructive Pulmonary Disease (COPD); Pneumothorax; Sleep Apnea; Tuberculosis Cardiovascular Complaints and Symptoms: No Complaints or Symptoms Medical History: Positive for: Hypertension Negative for: Angina; Arrhythmia; Congestive Heart Failure; Coronary Artery Disease; Deep Vein Thrombosis; Hypotension; Myocardial Infarction; Peripheral Arterial Disease; Peripheral Venous Disease; Phlebitis; Vasculitis Gastrointestinal MAXIMINA, PIROZZI ([id number]) Medical History: Negative for: Cirrhosis ; Colitis; Crohnos; Hepatitis A; Hepatitis B; Hepatitis C Endocrine Medical History: Positive for: Type II Diabetes Negative for: Type I Diabetes Time with diabetes: 1997 Treated with: Oral agents, Diet Blood sugar tested every day: Yes Tested : once a day Genitourinary Medical History: Negative for: End Stage Renal Disease Immunological Medical History: Negative for: Lupus Erythematosus; Raynaudos; Scleroderma Integumentary (Skin) Medical History: Negative for: History of Burn; History of pressure wounds Musculoskeletal Medical History: Negative for: Gout; Rheumatoid Arthritis;  Osteoarthritis; Osteomyelitis Neurologic Medical History: Negative for: Dementia; Neuropathy; Quadriplegia; Paraplegia; Seizure Disorder Oncologic Medical History: Negative for: Received Chemotherapy; Received Radiation Psychiatric Complaints and Symptoms: No Complaints or Symptoms Medical History: Negative for: Anorexia/bulimia; Confinement Anxiety HBO Extended History Items Eyes: Ear/Nose/Mouth/Throat: Cataracts Chronic sinus problems/congestion Immunizations Pneumococcal Vaccine: Received Pneumococcal Vaccination: No DHRITHI, RICHE ([id number]) Implantable Devices Family and Social History Cancer: No; Diabetes: Yes - Mother,Father; Heart Disease: No; Hereditary Spherocytosis: No; Hypertension: No; Kidney Disease: No; Lung Disease: No; Seizures: No; Stroke: No; Thyroid Problems: No; Tuberculosis: No; Never smoker; Alcohol Use: Never; Drug Use: No  History; Caffeine Use: Never; Financial Concerns: No; Food, Clothing or Shelter Needs: No; Transportation Concerns: No; Advanced Directives: No; Patient wants information on Advanced Directives; Advanced Directives information was provided to patient; Do not resuscitate: No; Living Will: No; Medical Power of Attorney: No Physician Affirmation I have reviewed and agree with the above information. Electronic Signature(s) Signed: 04/14/2018 7:11:38 AM By: Worthy Keeler PA-C Signed: 04/16/2018 2:41:13 PM By: Harold Barban Entered By: Worthy Keeler on 04/14/2018 06:22:58 [name] ([id number]) -------------------------------------------------------------------------------- SuperBill Details Patient Name: CAPRICE, MCCAFFREY. Date of Service: 04/13/2018 Medical Record Number: [id number] Patient Account Number: [id number] Date of Birth/Sex: [birth year] (73 y.o. 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) I10 Essential (primary) hypertension L84 Corns and callosities Facility Procedures CPT4 Code: 37628315 Description: 17616 - DEB SUBQ TISSUE 20 SQ CM/< ICD-10 Diagnosis Description L97.512 Non-pressure chronic ulcer of other part of right foot with fat Modifier: layer exposed Quantity: 1 Physician Procedures CPT4 Code: 0737106 Description: 26948 - WC PHYS SUBQ TISS 20 SQ CM ICD-10 Diagnosis Description L97.512 Non-pressure chronic ulcer of other part of right foot with fat Modifier: layer exposed Quantity: 1 Electronic Signature(s) Signed: 04/14/2018 7:11:38 AM By: Worthy Keeler PA-C Entered By: Worthy Keeler on 04/14/2018 06:25:11

## 2018-04-17 NOTE — Progress Notes (Addendum)
[name] [name], [name] [name] ([id number]) Visit Report for 04/13/2018 Arrival Information Details Patient Name: [name] [name], [name] [name]. Date of Service: 04/13/2018 12:45 PM Medical Record Number: [id number] Patient Account Number: [id number] Date of Birth/Sex: Aug 12, 1945 (73 y.o. F) Treating RN: Harold Barban Primary Care Amol Domanski: Delight Stare Other Clinician: Referring Yannis Broce: Delight Stare Treating Rosabell Geyer/Extender: Melburn Hake, HOYT Weeks in Treatment: 6 Visit Information History Since Last Visit Added or deleted any medications: No Patient Arrived: Cane Any new allergies or adverse reactions: No Arrival Time: 12:52 Had a fall or experienced change in No Accompanied By: son activities of daily living that may affect Transfer Assistance: None risk of falls: Patient Identification Verified: Yes Signs or symptoms of abuse/neglect since last visito No Secondary Verification Process Completed: Yes Hospitalized since last visit: No Implantable device outside of the clinic excluding No cellular tissue based products placed in the center since last visit: Has Dressing in Place as Prescribed: Yes Pain Present Now: No Electronic Signature(s) Signed: 04/13/2018 3:59:13 PM By: Lorine Bears RCP, RRT, CHT Entered By: Lorine Bears on 04/13/2018 12:53:00 [name] [name] ([id number]) -------------------------------------------------------------------------------- Lower Extremity Assessment Details Patient Name: [name] [name], [name] [name]. Date of Service: 04/13/2018 12:45 PM Medical Record Number: [id number] Patient Account Number: [id number] Date of Birth/Sex: 1945/12/13 (73 y.o. F) Treating RN: Secundino Ginger Primary Care Olof Marcil: Delight Stare Other Clinician: Referring Carlin Mamone: Delight Stare Treating Oralia Criger/Extender: STONE III, HOYT Weeks in Treatment: 6 Vascular Assessment Pulses: Dorsalis Pedis Palpable: [Right:Yes] Posterior Tibial Extremity colors, hair growth, and  conditions: Extremity Color: [Right:Normal] Hair Growth on Extremity: [Right:No] Temperature of Extremity: [Right:Warm] Capillary Refill: [Right:< 3 seconds] Toe Nail Assessment Left: Right: Thick: No Discolored: No Deformed: No Improper Length and Hygiene: No Electronic Signature(s) Signed: 04/13/2018 4:23:06 PM By: Secundino Ginger Entered By: Secundino Ginger on 04/13/2018 13:18:57 [name] [name] ([id number]) -------------------------------------------------------------------------------- Multi Wound Chart Details Patient Name: [name] [name]. Date of Service: 04/13/2018 12:45 PM Medical Record Number: [id number] Patient Account Number: [id number] Date of Birth/Sex: Oct 03, 1945 (73 y.o. 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] [N/A:N/A] Comorbid History: [1:Cataracts, Chronic sinus problems/congestion, Hypertension, Type II Diabetes Hypertension, Type II Diabetes] [2:Cataracts, Chronic sinus problems/congestion,] [N/A:N/A] Date Acquired: [1:06/16/2017] [2:04/10/2018] [N/A:N/A] Weeks of Treatment: [1:6] [2:0] [N/A:N/A] Wound Status: [1:Open] [2:Open] [N/A:N/A] Measurements L x W x D [1:0.9x0.8x0.4] [2:0.7x1.1x0.1] [N/A:N/A] (cm) Area (cm) : [1:0.565] [2:0.605] [N/A:N/A] Volume (cm) : [1:0.226] [2:0.06] [N/A:N/A] % Reduction in Area: [1:74.30%] [2:0.00%] [N/A:N/A] % Reduction in Volume: [1:74.30%] [2:0.00%]  [N/A:N/A] Classification: [1:Grade 2] [2:Partial Thickness] [N/A:N/A] Exudate Amount: [1:Medium] [2:Small] [N/A:N/A] Exudate Type: [1:Serosanguineous] [2:Serous] [N/A:N/A] Exudate Color: [1:red, Raczkowski] [2:amber] [N/A:N/A] Wound Margin: [1:Thickened] [2:N/A] [N/A:N/A] Granulation Amount: [1:Medium (34-66%)] [2:Medium (34-66%)] [N/A:N/A] Granulation Quality: [1:Red] [2:N/A] [N/A:N/A] Necrotic Amount: [1:Medium (34-66%)] [2:Medium (34-66%)] [N/A:N/A] Exposed Structures: [1:Fat Layer (Subcutaneous Tissue) Exposed: Yes Fascia: No Tendon: No Muscle: No Joint: No Bone: No] [2:Fascia: No Fat Layer (Subcutaneous Tissue) Exposed: No Tendon: No Muscle: No Joint: No Bone: No] [N/A:N/A] Epithelialization: [1:Small (1-33%)] [2:N/A] [N/A:N/A] Periwound Skin Texture: [1:Callus: Yes Excoriation: No Induration: No Crepitus: No] [2:Excoriation: No Induration: No Callus: No Crepitus:  No] [N/A:N/A] Rash: No Rash: No Scarring: No Scarring: No Periwound Skin Moisture: Maceration: Yes Maceration: No N/A Dry/Scaly: No Dry/Scaly: No Periwound Skin Color: Atrophie Blanche: No Atrophie Blanche: No N/A Cyanosis: No Cyanosis: No Ecchymosis: No Ecchymosis: No Erythema: No Erythema: No Hemosiderin Staining: No Hemosiderin Staining: No Mottled: No Mottled: No Pallor: No Pallor: No Rubor: No Rubor: No Tenderness on Palpation: No No N/A Wound Preparation: Ulcer Cleansing: Ulcer Cleansing: N/A Rinsed/Irrigated with Saline Rinsed/Irrigated with Saline, Other: soap and water Topical Anesthetic Applied: Other: lidocaine 4% Topical Anesthetic Applied: Other: lidocaine 4% Treatment Notes Electronic Signature(s) Signed: 04/16/2018 2:41:13 PM By: Harold Barban Entered By: Harold Barban on 04/13/2018 13:31:33 [name] [name] ([id number]) -------------------------------------------------------------------------------- [place] Details Patient Name: [name] [name], [name] [name]. Date of Service:  04/13/2018 12:45 PM Medical Record Number: [id number] Patient Account Number: [id number] Date of Birth/Sex: [birth year] (73 y.o. F) Treating RN: Harold Barban Primary Care Vanna Sailer: Delight Stare Other Clinician: Referring [place] Doig: Delight Stare Treating Alajiah Dutkiewicz/Extender: Melburn Hake, HOYT Weeks in Treatment: 6 Active Inactive Electronic Signature(s) Signed: 05/25/2018 11:27:24 AM By: Gretta Cool, BSN, RN, CWS, Kim RN, BSN Signed: 06/15/2018 10:53:34 AM By: Harold Barban Previous Signature: 04/16/2018 2:41:13 PM Version By: Harold Barban Entered By: Gretta Cool BSN, RN, CWS, Kim on 05/25/2018 11:27:23 [name] [name], [name] [name] ([id number]) -------------------------------------------------------------------------------- Pain Assessment Details Patient Name: [name] [name], [name] [name]. Date of Service: 04/13/2018 12:45 PM Medical Record Number: [id number] Patient Account Number: [id number] Date of Birth/Sex: 1945-04-18 (73 y.o. F) Treating RN: Harold Barban Primary Care Leyna Vanderkolk: Delight Stare Other Clinician: Referring Bayan Kushnir: Delight Stare Treating Arryn Terrones/Extender: Melburn Hake, HOYT Weeks in Treatment: 6 Active Problems Location of Pain Severity and Description of Pain Patient Has Paino No Site Locations Pain Management and Medication Current Pain Management: Electronic Signature(s) Signed: 04/13/2018 3:59:13 PM By: Lorine Bears RCP, RRT, CHT Signed: 04/16/2018 2:41:13 PM By: Harold Barban Entered By: Lorine Bears on 04/13/2018 12:53:08 [name] [name] ([id number]) -------------------------------------------------------------------------------- Patient/Caregiver Education Details Patient Name: [name] [name], [name] [name]. Date of Service: 04/13/2018 12:45 PM Medical Record Number: [id number] Patient Account Number: [id number] Date of Birth/Gender: 10/06/1945 (73 y.o. F) Treating RN: Harold Barban Primary Care Physician: Delight Stare Other Clinician: Referring Physician: Delight Stare Treating  Physician/Extender: Sharalyn Ink in Treatment: 6 Education Assessment Education Provided To: Patient Education Topics Provided Electronic Signature(s) Signed: 04/16/2018 2:41:13 PM By: Harold Barban Entered By: Harold Barban on 04/13/2018 13:31:46 [name] [name] ([id number]) -------------------------------------------------------------------------------- Wound Assessment Details Patient Name: [name] [name]. Date of Service: 04/13/2018 12:45 PM Medical Record Number: [id number] Patient Account Number: [id number] Date of Birth/Sex: 1946/02/09 (74 y.o. F) Treating RN: Secundino Ginger Primary Care Reia Viernes: Delight Stare Other Clinician: Referring Adriel Kessen: Delight Stare Treating Josephine Rudnick/Extender: STONE III, HOYT Weeks in Treatment: 6 Wound Status Wound Number: 1 Primary Diabetic Wound/Ulcer of the Lower Extremity Etiology: Wound Location: Right Foot - Plantar Wound Open Wounding Event: Gradually Appeared Status: Date Acquired: 06/16/2017 Comorbid Cataracts, Chronic sinus problems/congestion, Weeks Of Treatment: 6 History: Hypertension, Type II Diabetes Clustered Wound: No Photos Photo Uploaded By: Secundino Ginger on 04/13/2018 13:37:42 Wound Measurements Length: (cm) 0.9 Width: (cm) 0.8 Depth: (cm) 0.4 Area: (cm) 0.565 Volume: (cm) 0.226 % Reduction in Area: 74.3% % Reduction in Volume: 74.3% Epithelialization: Small (1-33%) Tunneling: No Undermining: No Wound Description Classification: Grade 2 Foul Odor Wound Margin: Thickened Slough/Fib Exudate Amount: Medium Exudate Type: Serosanguineous Exudate Color: red, Bazinet After Cleansing: No rino Yes Wound Bed Granulation Amount: Medium (34-66%) Exposed Structure Granulation Quality: Red Fascia Exposed: No Necrotic Amount: Medium (  34-66%) Fat Layer (Subcutaneous Tissue) Exposed: Yes Necrotic Quality: Adherent Slough Tendon Exposed: No Muscle Exposed: No Joint Exposed: No Bone Exposed: No Periwound Skin  Texture [name] [name], [name] [name]. ([id number]) Texture Color No Abnormalities Noted: No No Abnormalities Noted: No Callus: Yes Atrophie Blanche: No Crepitus: No Cyanosis: No Excoriation: No Ecchymosis: No Induration: No Erythema: No Rash: No Hemosiderin Staining: No Scarring: No Mottled: No Pallor: No Moisture Rubor: No No Abnormalities Noted: No Dry / Scaly: No Maceration: Yes Wound Preparation Ulcer Cleansing: Rinsed/Irrigated with Saline Topical Anesthetic Applied: Other: lidocaine 4%, Electronic Signature(s) Signed: 04/13/2018 4:23:06 PM By: Secundino Ginger Entered By: Secundino Ginger on 04/13/2018 13:16:33 [name] [name] ([id number]) -------------------------------------------------------------------------------- Wound Assessment Details Patient Name: [name] [name]. Date of Service: 04/13/2018 12:45 PM Medical Record Number: [id number] Patient Account Number: [id number] Date of Birth/Sex: [birth year] (73 y.o. F) Treating RN: Secundino Ginger Primary Care Laniece Hornbaker: Delight Stare Other Clinician: Referring Gerhart Ruggieri: Delight Stare Treating Sandria Mcenroe/Extender: STONE III, HOYT Weeks in Treatment: 6 Wound Status Wound Number: 2 Primary Skin Tear Etiology: Wound Location: Right Lower Leg - Anterior Wound Open Wounding Event: Pressure Injury Status: Date Acquired: 04/10/2018 Comorbid Cataracts, Chronic sinus problems/congestion, Weeks Of Treatment: 0 History: Hypertension, Type II Diabetes Clustered Wound: No Photos Photo Uploaded By: Secundino Ginger on 04/13/2018 13:38:30 Wound Measurements Length: (cm) 0.7 % Reductio Width: (cm) 1.1 % Reductio Depth: (cm) 0.1 Tunneling: Area: (cm) 0.605 Undermini Volume: (cm) 0.06 n in Area: 0% n in Volume: 0% No ng: No Wound Description Classification: Partial Thickness Foul Odor Exudate Amount: Small Slough/Fib Exudate Type: Serous Exudate Color: amber After Cleansing: No rino Yes Wound Bed Granulation Amount: Medium (34-66%) Exposed  Structure Necrotic Amount: Medium (34-66%) Fascia Exposed: No Necrotic Quality: Adherent Slough Fat Layer (Subcutaneous Tissue) Exposed: No Tendon Exposed: No Muscle Exposed: No Joint Exposed: No Bone Exposed: No Periwound Skin Texture Texture Color [name] [name], [name] [name]. ([id number]) No Abnormalities Noted: No No Abnormalities Noted: No Callus: No Atrophie Blanche: No Crepitus: No Cyanosis: No Excoriation: No Ecchymosis: No Induration: No Erythema: No Rash: No Hemosiderin Staining: No Scarring: No Mottled: No Pallor: No Moisture Rubor: No No Abnormalities Noted: No Dry / Scaly: No Maceration: No Wound Preparation Ulcer Cleansing: Rinsed/Irrigated with Saline, Other: soap and water, Topical Anesthetic Applied: Other: lidocaine 4%, Electronic Signature(s) Signed: 04/13/2018 4:23:06 PM By: Secundino Ginger Entered By: Secundino Ginger on 04/13/2018 13:18:26 [name] [name] ([id number]) -------------------------------------------------------------------------------- Vitals Details Patient Name: [name] [name]. Date of Service: 04/13/2018 12:45 PM Medical Record Number: [id number] Patient Account Number: [id number] Date of Birth/Sex: 1946/01/07 (73 y.o. F) Treating RN: Harold Barban Primary Care Gerell Fortson: Delight Stare Other Clinician: Referring Diksha Tagliaferro: Delight Stare Treating Dianne Whelchel/Extender: STONE III, HOYT Weeks in Treatment: 6 Vital Signs Time Taken: 12:53 Temperature (F): 97.8 Height (in): 67 Pulse (bpm): 52 Weight (lbs): 200 Respiratory Rate (breaths/min): 16 Body Mass Index (BMI): 31.3 Blood Pressure (mmHg): 149/60 Reference Range: 80 - 120 mg / dl Electronic Signature(s) Signed: 04/13/2018 3:59:13 PM By: Lorine Bears RCP, RRT, CHT Entered By: Lorine Bears on 04/13/2018 12:56:38

## 2018-04-20 ENCOUNTER — Emergency Department: Payer: Medicare HMO

## 2018-04-20 ENCOUNTER — Other Ambulatory Visit: Payer: Self-pay

## 2018-04-20 ENCOUNTER — Encounter: Payer: Self-pay | Admitting: Emergency Medicine

## 2018-04-20 ENCOUNTER — Emergency Department
Admission: EM | Admit: 2018-04-20 | Discharge: 2018-04-20 | Disposition: A | Discharge status: Home / Self Care | Payer: Medicare HMO | Attending: Emergency Medicine | Admitting: Emergency Medicine

## 2018-04-20 DIAGNOSIS — I10 Essential (primary) hypertension: Secondary | ICD-10-CM | POA: Insufficient documentation

## 2018-04-20 DIAGNOSIS — Y998 Other external cause status: Secondary | ICD-10-CM | POA: Insufficient documentation

## 2018-04-20 DIAGNOSIS — E119 Type 2 diabetes mellitus without complications: Secondary | ICD-10-CM | POA: Diagnosis not present

## 2018-04-20 DIAGNOSIS — Y92481 Parking lot as the place of occurrence of the external cause: Secondary | ICD-10-CM | POA: Insufficient documentation

## 2018-04-20 DIAGNOSIS — Y9301 Activity, walking, marching and hiking: Secondary | ICD-10-CM | POA: Insufficient documentation

## 2018-04-20 DIAGNOSIS — Z79899 Other long term (current) drug therapy: Secondary | ICD-10-CM | POA: Insufficient documentation

## 2018-04-20 DIAGNOSIS — S0990XA Unspecified injury of head, initial encounter: Secondary | ICD-10-CM | POA: Insufficient documentation

## 2018-04-20 DIAGNOSIS — S42309A Unspecified fracture of shaft of humerus, unspecified arm, initial encounter for closed fracture: Secondary | ICD-10-CM

## 2018-04-20 DIAGNOSIS — W01198A Fall on same level from slipping, tripping and stumbling with subsequent striking against other object, initial encounter: Secondary | ICD-10-CM | POA: Diagnosis not present

## 2018-04-20 DIAGNOSIS — S42345A Nondisplaced spiral fracture of shaft of humerus, left arm, initial encounter for closed fracture: Secondary | ICD-10-CM | POA: Diagnosis not present

## 2018-04-20 DIAGNOSIS — R52 Pain, unspecified: Secondary | ICD-10-CM

## 2018-04-20 DIAGNOSIS — W19XXXA Unspecified fall, initial encounter: Secondary | ICD-10-CM

## 2018-04-20 DIAGNOSIS — S4992XA Unspecified injury of left shoulder and upper arm, initial encounter: Secondary | ICD-10-CM | POA: Diagnosis present

## 2018-04-20 HISTORY — DX: Unspecified fracture of shaft of humerus, unspecified arm, initial encounter for closed fracture: S42.309A

## 2018-04-20 MED ORDER — OXYCODONE-ACETAMINOPHEN 5-325 MG PO TABS: 1.0000 | ORAL_TABLET | Freq: Once | ORAL | Status: DC

## 2018-04-20 MED ORDER — TETANUS-DIPHTH-ACELL PERTUSSIS 5-2.5-18.5 LF-MCG/0.5 IM SUSP
0.5000 mL | Freq: Once | INTRAMUSCULAR | Status: AC
  Administered 2018-04-20: 0.5 mL via INTRAMUSCULAR
  Filled 2018-04-20: qty 0.5

## 2018-04-20 MED ORDER — FENTANYL CITRATE (PF) 100 MCG/2ML IJ SOLN: 25.0000 ug | Freq: Once | INTRAMUSCULAR | Status: DC

## 2018-04-20 MED ORDER — FENTANYL CITRATE (PF) 100 MCG/2ML IJ SOLN
25.0000 ug | Freq: Once | INTRAMUSCULAR | Status: AC
  Administered 2018-04-20: 25 ug via INTRAMUSCULAR
  Filled 2018-04-20: qty 2

## 2018-04-20 MED ORDER — OXYCODONE-ACETAMINOPHEN 5-325 MG PO TABS
1.0000 | ORAL_TABLET | Freq: Once | ORAL | Status: AC
  Administered 2018-04-20: 1 via ORAL
  Filled 2018-04-20: qty 1

## 2018-04-20 MED ORDER — OXYCODONE-ACETAMINOPHEN 5-325 MG PO TABS: 1.0000 | ORAL_TABLET | ORAL | 0 refills | Status: DC | PRN

## 2018-04-20 NOTE — ED Triage Notes (Signed)
Tripped and fell in parking lot at eye center just before arrival. States pain L shoulder and elbow.

## 2018-04-20 NOTE — ED Notes (Signed)
Pt is being discharged to home with her son. Pt is AOx4, VSS, she denies any pain at this time and does not show any signs of distress. AVS and prescriptions was given and explained to the pt and son and they each verbalized understanding of all information.

## 2018-04-20 NOTE — ED Provider Notes (Signed)
[facility] Emergency Department Provider Note  ____________________________________________  Time seen: Approximately 5:41 PM  I have reviewed the triage vital signs and the nursing notes.   HISTORY  Chief Complaint Shoulder Pain and Elbow Pain    HPI [name] [name] is a 73 y.o. female that presents to the emergency department for evaluation of left arm pain after falling in the eye center parking lot this afternoon.  Patient was leaving after an appointment when she tripped and fell.  She hit her forehead on the concrete. She did not lose consciousness. She takes a baby aspirin daily.   She has abrasions to her right knuckles and her left knee.  She primarily has pain to her left arm.  No headache, visual changes, shortness of breath, chest pain, abdominal pain.  Past Medical History:  Diagnosis Date  . Diabetes mellitus without complication ([place])   . Hypertension     There are no active problems to display for this patient.   History reviewed. No pertinent surgical history.  Prior to Admission medications   Medication Sig Start Date End Date Taking? Authorizing Provider  amLODipine (NORVASC) 10 MG tablet Take 1 tablet by mouth daily. 05/02/16   [provider]  atorvastatin (LIPITOR) 40 MG tablet Take 1 tablet by mouth daily. 05/02/16   [provider]  carvedilol (COREG) 25 MG tablet Take 1 tablet by mouth 2 (two) times daily. 05/02/16   [provider]  cephALEXin (KEFLEX) 500 MG capsule Take 1 capsule (500 mg total) by mouth 4 (four) times daily. 05/10/15   Eula Listen, MD  KOMBIGLYZE XR 08-998 MG TB24 Take 1 tablet by mouth 2 (two) times daily. 05/06/16   [provider]  meloxicam (MOBIC) 15 MG tablet Take 1 tablet (15 mg total) by mouth daily. 03/07/15   Beers, Pierce Crane, PA-C  ondansetron (ZOFRAN-ODT) 4 MG disintegrating tablet Take 1 tablet (4 mg total) by mouth every 8 (eight) hours as needed for nausea or  vomiting. 05/10/15   Eula Listen, MD  oxyCODONE-acetaminophen (PERCOCET) 5-325 MG tablet Take 1 tablet by mouth every 4 (four) hours as needed for severe pain. 04/20/18 04/20/19  Laban Emperor, PA-C  predniSONE (DELTASONE) 50 MG tablet Take one 50 mg tablet once daily for the next five days. 01/03/18   Lannie Fields, PA-C  quinapril (ACCUPRIL) 40 MG tablet Take 1 tablet by mouth 2 (two) times daily. 05/02/16   [provider]  TANZEUM 30 MG PEN Inject 1 Dose into the skin daily. 05/10/16   [provider]  traMADol (ULTRAM) 50 MG tablet Take 1 tablet (50 mg total) by mouth 2 (two) times daily. 09/14/16   Menshew, Dannielle Karvonen, PA-C    Allergies Patient has no known allergies.  Family History  Problem Relation Age of Onset  . Breast cancer Neg Hx     Social History Social History   Tobacco Use  . Smoking status: Never Smoker  . Smokeless tobacco: Never Used  Substance Use Topics  . Alcohol use: No  . Drug use: Not on file     Review of Systems  Cardiovascular: No chest pain. Respiratory: No SOB. Gastrointestinal: No abdominal pain.  No nausea, no vomiting.  Musculoskeletal: Positive for arm pain. Skin: Negative for rash, abrasions, lacerations. Positive for ecchymosis. Neurological: Negative for headaches, numbness or tingling   ____________________________________________   PHYSICAL EXAM:  VITAL SIGNS: ED Triage Vitals  Enc Vitals Group     BP 04/20/18 1538 (!)  177/76     Pulse Rate 04/20/18 1538 64     Resp 04/20/18 1538 18     Temp 04/20/18 1538 98.3 F (36.8 C)     Temp Source 04/20/18 1538 Oral     SpO2 04/20/18 1538 98 %     Weight 04/20/18 1539 200 lb (90.7 kg)     Height 04/20/18 1539 5\' 7"  (1.702 m)     Head Circumference --      Peak Flow --      Pain Score 04/20/18 1539 10     Pain Loc --      Pain Edu? --      Excl. in [place]? --      Constitutional: Alert and oriented. Well appearing and in no acute distress. Eyes:  Conjunctivae are normal. PERRL. EOMI. Head: Hematoma to right forehead. ENT:      Ears:      Nose: No congestion/rhinnorhea.      Mouth/Throat: Mucous membranes are moist.  Neck: No stridor.  No cervical spine tenderness to palpation. Cardiovascular: Normal rate, regular rhythm.  Good peripheral circulation. Symmetric radial pulses bilaterally. Respiratory: Normal respiratory effort without tachypnea or retractions. Lungs CTAB. Good air entry to the bases with no decreased or absent breath sounds. Gastrointestinal: Bowel sounds 4 quadrants. Soft and nontender to palpation. No guarding or rigidity. No palpable masses. No distention. Musculoskeletal: Full range of motion to all extremities. No gross deformities appreciated.  Tenderness to palpation to left humerus.  Pain with range of motion of left shoulder. Neurologic:  Normal speech and language. No gross focal neurologic deficits are appreciated.  Skin:  Skin is warm, dry and intact. No rash noted. Psychiatric: Mood and affect are normal. Speech and behavior are normal. Patient exhibits appropriate insight and judgement.   ____________________________________________   LABS (all labs ordered are listed, but only abnormal results are displayed)  Labs Reviewed - No data to display ____________________________________________  EKG   ____________________________________________  RADIOLOGY Robinette Haines, personally viewed and evaluated these images (plain radiographs) as part of my medical decision making, as well as reviewing the written report by the radiologist.  Ct Head Wo Contrast  Result Date: 04/20/2018 CLINICAL DATA:  Pain after fall. EXAM: CT HEAD WITHOUT CONTRAST CT CERVICAL SPINE WITHOUT CONTRAST TECHNIQUE: Multidetector CT imaging of the head and cervical spine was performed following the standard protocol without intravenous contrast. Multiplanar CT image reconstructions of the cervical spine were also generated.  COMPARISON:  CT of the brain May 28, 2017 FINDINGS: CT HEAD FINDINGS Brain: No subdural, epidural, or subarachnoid hemorrhage. Ventricles and sulci are stable. Cerebellum, brainstem, and basal cisterns are normal. No acute cortical ischemia or infarct identified. No mass effect or midline shift. Chronic white matter changes. Vascular: Calcified atherosclerosis in the intracranial carotids. Skull: Normal. Negative for fracture or focal lesion. Sinuses/Orbits: No acute finding. Other: Soft tissue swelling in the right forehead and supraorbital region. The globes are intact within visualize limits. Extracranial soft tissues otherwise normal. CT CERVICAL SPINE FINDINGS Alignment: Normal. Skull base and vertebrae: No acute fracture. No primary bone lesion or focal pathologic process. Soft tissues and spinal canal: No prevertebral fluid or swelling. No visible canal hematoma. Disc levels:  Multilevel degenerative changes. Upper chest: Negative. Other: No other abnormalities identified. IMPRESSION: 1. No acute intracranial abnormalities. Chronic white matter changes. 2. Soft tissue swelling over the right forehead and supraorbital region. 3. No fracture or traumatic malalignment in the cervical spine. Mild multilevel degenerative changes. Electronically  Signed   By: Dorise Bullion III M.D   On: 04/20/2018 19:27   Ct Cervical Spine Wo Contrast  Result Date: 04/20/2018 CLINICAL DATA:  Pain after fall. EXAM: CT HEAD WITHOUT CONTRAST CT CERVICAL SPINE WITHOUT CONTRAST TECHNIQUE: Multidetector CT imaging of the head and cervical spine was performed following the standard protocol without intravenous contrast. Multiplanar CT image reconstructions of the cervical spine were also generated. COMPARISON:  CT of the brain May 28, 2017 FINDINGS: CT HEAD FINDINGS Brain: No subdural, epidural, or subarachnoid hemorrhage. Ventricles and sulci are stable. Cerebellum, brainstem, and basal cisterns are normal. No acute  cortical ischemia or infarct identified. No mass effect or midline shift. Chronic white matter changes. Vascular: Calcified atherosclerosis in the intracranial carotids. Skull: Normal. Negative for fracture or focal lesion. Sinuses/Orbits: No acute finding. Other: Soft tissue swelling in the right forehead and supraorbital region. The globes are intact within visualize limits. Extracranial soft tissues otherwise normal. CT CERVICAL SPINE FINDINGS Alignment: Normal. Skull base and vertebrae: No acute fracture. No primary bone lesion or focal pathologic process. Soft tissues and spinal canal: No prevertebral fluid or swelling. No visible canal hematoma. Disc levels:  Multilevel degenerative changes. Upper chest: Negative. Other: No other abnormalities identified. IMPRESSION: 1. No acute intracranial abnormalities. Chronic white matter changes. 2. Soft tissue swelling over the right forehead and supraorbital region. 3. No fracture or traumatic malalignment in the cervical spine. Mild multilevel degenerative changes. Electronically Signed   By: Dorise Bullion III M.D   On: 04/20/2018 19:27   Dg Humerus Left  Result Date: 04/20/2018 CLINICAL DATA:  [place] today with arm and shoulder pain. EXAM: LEFT HUMERUS - 2+ VIEW COMPARISON:  None. FINDINGS: Spiral fracture of the humeral diaphysis in the proximal third without angulation, displacement or foreshortening. Humeral head subluxed slightly inferiorly, possibly positional. Chronic AC joint degenerative change. No other regional fracture. IMPRESSION: Spiral fracture of the humeral diaphysis in the proximal third without angulation, displacement or foreshortening. Electronically Signed   By: Nelson Chimes M.D.   On: 04/20/2018 17:03    ____________________________________________    PROCEDURES  Procedure(s) performed:    Procedures    Medications  fentaNYL (SUBLIMAZE) injection 25 mcg (25 mcg Intramuscular Given 04/20/18 1830)  Tdap (BOOSTRIX) injection  0.5 mL (0.5 mLs Intramuscular Given 04/20/18 1926)     ____________________________________________   INITIAL IMPRESSION / ASSESSMENT AND PLAN / ED COURSE  Pertinent labs & imaging results that were available during my care of the patient were reviewed by me and considered in my medical decision making (see chart for details).  Review of the [place] CSRS was performed in accordance of the [place] prior to dispensing any controlled drugs.   Patient presented to emergency department for evaluation of fall today.  Vital signs and exam are reassuring.  Left humerus x-ray consistent with spiral fracture.  Dr. Posey Pronto was consulted and recommends sling and CT of shoulder.  IM fentanyl was given for pain. Dr. Posey Pronto came to see the patient and plans to follow up with her in clinic this week. He reviewed xray and CT images.  CT head is negative for fracture and acute intracranial abnormalities.  CT cervical negative for fracture.  Patient is comfortable and ready to go home.  She will follow-up with Ortho and primary care this week.  She will return to the emergency department for any change or worsening of symptoms.     ____________________________________________  FINAL CLINICAL IMPRESSION(S) / ED DIAGNOSES  Final diagnoses:  Fall,  initial encounter  Closed nondisplaced spiral fracture of shaft of left humerus, initial encounter  Injury of head, initial encounter      NEW MEDICATIONS STARTED DURING THIS VISIT:  ED Discharge Orders         Ordered    oxyCODONE-acetaminophen (PERCOCET) 5-325 MG tablet  Every 4 hours PRN     04/20/18 1922              This chart was dictated using voice recognition software/Dragon. Despite best efforts to proofread, errors can occur which can change the meaning. Any change was purely unintentional.    Laban Emperor, PA-C 04/20/18 1934    Lavonia Drafts, MD 04/20/18 [phone]

## 2018-04-20 NOTE — Discharge Instructions (Addendum)
You have a fracture of the humerus in your arm.  Please wear shoulder immobilizer until you follow-up with Dr. Posey Pronto this week.  You can take Percocet for extreme pain.  Please call your primary care provider for an appointment as soon as possible.

## 2018-04-20 NOTE — Consult Note (Signed)
ORTHOPAEDIC CONSULTATION  REQUESTING PHYSICIAN: Nena Polio, MD  Chief Complaint:   L shoulder/arm pain  History of Present Illness: [name] [name] is a 73 y.o. female who had a fall earlier today.  The patient noted immediate shoulder pain and difficulty moving her shoulder.  The patient ambulates unassisted at baseline and lives with her son.  Pain is described as sharp at its worst and a dull ache at its best.  Pain is rated a 10 out of 10 in severity.  Pain is improved with rest and immobilization.  Pain is worse with any sort of movement.  X-rays in the emergency department show a left minimally displaced proximal spiral humeral shaft fracture.  Past Medical History:  Diagnosis Date  . Diabetes mellitus without complication ([place])   . Hypertension    History reviewed. No pertinent surgical history. Social History   Socioeconomic History  . Marital status: Divorced    Spouse name: Not on file  . Number of children: Not on file  . Years of education: Not on file  . Highest education level: Not on file  Occupational History  . Not on file  Social Needs  . Financial resource strain: Not on file  . Food insecurity:    Worry: Not on file    Inability: Not on file  . Transportation needs:    Medical: Not on file    Non-medical: Not on file  Tobacco Use  . Smoking status: Never Smoker  . Smokeless tobacco: Never Used  Substance and Sexual Activity  . Alcohol use: No  . Drug use: Not on file  . Sexual activity: Not on file  Lifestyle  . Physical activity:    Days per week: Not on file    Minutes per session: Not on file  . Stress: Not on file  Relationships  . Social connections:    Talks on phone: Not on file    Gets together: Not on file    Attends religious service: Not on file    Active member of club or organization: Not on file    Attends meetings of clubs or organizations: Not on file   Relationship status: Not on file  Other Topics Concern  . Not on file  Social History Narrative  . Not on file   Family History  Problem Relation Age of Onset  . Breast cancer Neg Hx    No Known Allergies Prior to Admission medications   Medication Sig Start Date End Date Taking? Authorizing Provider  amLODipine (NORVASC) 10 MG tablet Take 1 tablet by mouth daily. 05/02/16   [provider]  atorvastatin (LIPITOR) 40 MG tablet Take 1 tablet by mouth daily. 05/02/16   [provider]  carvedilol (COREG) 25 MG tablet Take 1 tablet by mouth 2 (two) times daily. 05/02/16   [provider]  cephALEXin (KEFLEX) 500 MG capsule Take 1 capsule (500 mg total) by mouth 4 (four) times daily. 05/10/15   Eula Listen, MD  KOMBIGLYZE XR 08-998 MG TB24 Take 1 tablet by mouth 2 (two) times daily. 05/06/16   [provider]  meloxicam (MOBIC) 15 MG tablet Take 1 tablet (15 mg total) by mouth daily. 03/07/15   Beers, Pierce Crane, PA-C  ondansetron (ZOFRAN-ODT) 4 MG disintegrating tablet Take 1 tablet (4 mg total) by mouth every 8 (eight) hours as needed for nausea or vomiting. 05/10/15   Eula Listen, MD  predniSONE (DELTASONE) 50 MG tablet Take one 50 mg tablet once daily  for the next five days. 01/03/18   Lannie Fields, PA-C  quinapril (ACCUPRIL) 40 MG tablet Take 1 tablet by mouth 2 (two) times daily. 05/02/16   [provider]  TANZEUM 30 MG PEN Inject 1 Dose into the skin daily. 05/10/16   [provider]  traMADol (ULTRAM) 50 MG tablet Take 1 tablet (50 mg total) by mouth 2 (two) times daily. 09/14/16   Menshew, Dannielle Karvonen, PA-C   No results for input(s): WBC, HGB, HCT, PLT, K, CL, CO2, BUN, CREATININE, GLUCOSE, CALCIUM, LABPT, INR in the last 72 hours. Dg Humerus Left  Result Date: 04/20/2018 CLINICAL DATA:  [place] today with arm and shoulder pain. EXAM: LEFT HUMERUS - 2+ VIEW COMPARISON:  None. FINDINGS: Spiral fracture of the humeral  diaphysis in the proximal third without angulation, displacement or foreshortening. Humeral head subluxed slightly inferiorly, possibly positional. Chronic AC joint degenerative change. No other regional fracture. IMPRESSION: Spiral fracture of the humeral diaphysis in the proximal third without angulation, displacement or foreshortening. Electronically Signed   By: Nelson Chimes M.D.   On: 04/20/2018 17:03     Positive ROS: All other systems have been reviewed and were otherwise negative with the exception of those mentioned in the HPI and as above.  Physical Exam: BP (!) 177/76   Pulse 64   Temp 98.3 F (36.8 C) (Oral)   Resp 18   Ht 5\' 7"  (1.702 m)   Wt 90.7 kg   SpO2 98%   BMI 31.32 kg/m  General:  Alert, no acute distress Psychiatric:  Patient is competent for consent with normal mood and affect   Cardiovascular:  No pedal edema, regular rate and rhythm Respiratory:  No wheezing, non-labored breathing GI:  Abdomen is soft and non-tender Skin:  No lesions in the area of chief complaint, no erythema Neurologic:  Sensation intact distally, CN grossly intact Lymphatic:  No axillary or cervical lymphadenopathy  Orthopedic Exam:  LUE: +ain/pin/u motor SILT r/u/m/ax +rad pulse Significant TTP over proximal shoulder RoM limited by pain   X-rays:  As above: left minimally displaced spiral proximal humeral shaft fracture  Assessment/Plan: [name] [name] is a 73 y.o. female with a left minimally displaced spiral proximal humeral shaft fracture   1. I discussed the various treatment options including both surgical and non-surgical management of her fracture with the patient and her son. We discussed that the fracture was in essentially anatomic alignment and surgery would likely not make this significantly better. Patient and son are in agreement to proceed with non-operative management.  2. NWB on LUE  3. Immobilizer shoulder. Can perform finger/hand/wrist RoM exercises 4. F/U  with me on 04/30/18 in [place] for repeat radiographs.   Leim Fabry   04/20/2018 7:17 PM

## 2018-04-20 NOTE — ED Notes (Signed)
See triage note  States she tripped in parking at eye center  Having pain to left shoulder and left elbow  No deformity noted  Good pulses

## 2018-04-20 NOTE — ED Notes (Signed)
Posey Pronto, MD at the bedside.

## 2018-04-21 ENCOUNTER — Ambulatory Visit: Payer: Medicare Other | Admitting: Physician Assistant

## 2018-04-24 ENCOUNTER — Inpatient Hospital Stay
Admission: EM | Admit: 2018-04-24 | Discharge: 2018-04-27 | DRG: 854 | Disposition: A | Discharge status: Home / Self Care | Payer: Medicare Other | Attending: Internal Medicine | Admitting: Internal Medicine

## 2018-04-24 ENCOUNTER — Emergency Department: Payer: Medicare Other

## 2018-04-24 DIAGNOSIS — Z79890 Hormone replacement therapy (postmenopausal): Secondary | ICD-10-CM | POA: Diagnosis not present

## 2018-04-24 DIAGNOSIS — T3695XA Adverse effect of unspecified systemic antibiotic, initial encounter: Secondary | ICD-10-CM | POA: Diagnosis not present

## 2018-04-24 DIAGNOSIS — E1122 Type 2 diabetes mellitus with diabetic chronic kidney disease: Secondary | ICD-10-CM | POA: Diagnosis not present

## 2018-04-24 DIAGNOSIS — Y92230 Patient room in hospital as the place of occurrence of the external cause: Secondary | ICD-10-CM | POA: Diagnosis not present

## 2018-04-24 DIAGNOSIS — D631 Anemia in chronic kidney disease: Secondary | ICD-10-CM | POA: Diagnosis present

## 2018-04-24 DIAGNOSIS — S42302D Unspecified fracture of shaft of humerus, left arm, subsequent encounter for fracture with routine healing: Secondary | ICD-10-CM

## 2018-04-24 DIAGNOSIS — E860 Dehydration: Secondary | ICD-10-CM | POA: Diagnosis present

## 2018-04-24 DIAGNOSIS — L97419 Non-pressure chronic ulcer of right heel and midfoot with unspecified severity: Secondary | ICD-10-CM | POA: Diagnosis not present

## 2018-04-24 DIAGNOSIS — R110 Nausea: Secondary | ICD-10-CM | POA: Diagnosis not present

## 2018-04-24 DIAGNOSIS — N183 Chronic kidney disease, stage 3 (moderate): Secondary | ICD-10-CM | POA: Diagnosis present

## 2018-04-24 DIAGNOSIS — R197 Diarrhea, unspecified: Secondary | ICD-10-CM

## 2018-04-24 DIAGNOSIS — Z23 Encounter for immunization: Secondary | ICD-10-CM

## 2018-04-24 DIAGNOSIS — I129 Hypertensive chronic kidney disease with stage 1 through stage 4 chronic kidney disease, or unspecified chronic kidney disease: Secondary | ICD-10-CM | POA: Diagnosis not present

## 2018-04-24 DIAGNOSIS — E039 Hypothyroidism, unspecified: Secondary | ICD-10-CM | POA: Diagnosis present

## 2018-04-24 DIAGNOSIS — Z9181 History of falling: Secondary | ICD-10-CM

## 2018-04-24 DIAGNOSIS — A419 Sepsis, unspecified organism: Principal | ICD-10-CM | POA: Diagnosis present

## 2018-04-24 DIAGNOSIS — Z79899 Other long term (current) drug therapy: Secondary | ICD-10-CM | POA: Diagnosis not present

## 2018-04-24 DIAGNOSIS — E11621 Type 2 diabetes mellitus with foot ulcer: Secondary | ICD-10-CM | POA: Diagnosis present

## 2018-04-24 DIAGNOSIS — Z794 Long term (current) use of insulin: Secondary | ICD-10-CM

## 2018-04-24 DIAGNOSIS — E785 Hyperlipidemia, unspecified: Secondary | ICD-10-CM | POA: Diagnosis present

## 2018-04-24 DIAGNOSIS — W19XXXD Unspecified fall, subsequent encounter: Secondary | ICD-10-CM | POA: Diagnosis present

## 2018-04-24 DIAGNOSIS — E11610 Type 2 diabetes mellitus with diabetic neuropathic arthropathy: Secondary | ICD-10-CM | POA: Diagnosis present

## 2018-04-24 DIAGNOSIS — R112 Nausea with vomiting, unspecified: Secondary | ICD-10-CM

## 2018-04-24 LAB — COMPREHENSIVE METABOLIC PANEL
ALT: 17 U/L (ref 0–44)
AST: 34 U/L (ref 15–41)
Albumin: 4.3 g/dL (ref 3.5–5.0)
Alkaline Phosphatase: 97 U/L (ref 38–126)
Anion gap: 7 (ref 5–15)
BUN: 29 mg/dL — ABNORMAL HIGH (ref 8–23)
CO2: 20 mmol/L — ABNORMAL LOW (ref 22–32)
Calcium: 8.9 mg/dL (ref 8.9–10.3)
Chloride: 111 mmol/L (ref 98–111)
Creatinine, Ser: 1.92 mg/dL — ABNORMAL HIGH (ref 0.44–1.00)
GFR calc Af Amer: 30 mL/min — ABNORMAL LOW (ref >60)
GFR calc non Af Amer: 26 mL/min — ABNORMAL LOW (ref >60)
Glucose, Bld: 69 mg/dL — ABNORMAL LOW (ref 70–99)
Potassium: 4 mmol/L (ref 3.5–5.1)
Sodium: 138 mmol/L (ref 135–145)
Total Bilirubin: 0.8 mg/dL (ref 0.3–1.2)
Total Protein: 8.5 g/dL — ABNORMAL HIGH (ref 6.5–8.1)

## 2018-04-24 LAB — INFLUENZA PANEL BY PCR (TYPE A & B)
Influenza A By PCR: NEGATIVE
Influenza B By PCR: NEGATIVE

## 2018-04-24 LAB — CBC
HCT: 33.6 % — ABNORMAL LOW (ref 36.0–46.0)
Hemoglobin: 10.7 g/dL — ABNORMAL LOW (ref 12.0–15.0)
MCH: 28.9 pg (ref 26.0–34.0)
MCHC: 31.8 g/dL (ref 30.0–36.0)
MCV: 90.8 fL (ref 80.0–100.0)
Platelets: 191 10*3/uL (ref 150–400)
RBC: 3.7 MIL/uL — ABNORMAL LOW (ref 3.87–5.11)
RDW: 13.4 % (ref 11.5–15.5)
WBC: 17.3 10*3/uL — ABNORMAL HIGH (ref 4.0–10.5)
nRBC: 0 % (ref 0.0–0.2)

## 2018-04-24 LAB — LIPASE, BLOOD: Lipase: 35 U/L (ref 11–51)

## 2018-04-24 LAB — LACTIC ACID, PLASMA: Lactic Acid, Venous: 0.9 mmol/L (ref 0.5–1.9)

## 2018-04-24 LAB — PROCALCITONIN: Procalcitonin: 0.41 ng/mL

## 2018-04-24 MED ORDER — VANCOMYCIN HCL IN DEXTROSE 1-5 GM/200ML-% IV SOLN
1000.0000 mg | Freq: Once | INTRAVENOUS | Status: AC
  Administered 2018-04-24: 1000 mg via INTRAVENOUS
  Filled 2018-04-24: qty 200

## 2018-04-24 MED ORDER — ACETAMINOPHEN 500 MG PO TABS
1000.0000 mg | ORAL_TABLET | Freq: Once | ORAL | Status: AC
  Administered 2018-04-24: 1000 mg via ORAL

## 2018-04-24 MED ORDER — METRONIDAZOLE IN NACL 5-0.79 MG/ML-% IV SOLN
500.0000 mg | Freq: Three times a day (TID) | INTRAVENOUS | Status: DC
  Administered 2018-04-24: 500 mg via INTRAVENOUS
  Filled 2018-04-24 (×3): qty 100

## 2018-04-24 MED ORDER — SODIUM CHLORIDE 0.9 % IV SOLN
2.0000 g | Freq: Once | INTRAVENOUS | Status: AC
  Administered 2018-04-24: 2 g via INTRAVENOUS
  Filled 2018-04-24: qty 2

## 2018-04-24 MED ORDER — ACETAMINOPHEN 500 MG PO TABS
ORAL_TABLET | ORAL | Status: AC
  Administered 2018-04-24: 1000 mg via ORAL
  Filled 2018-04-24: qty 2

## 2018-04-24 MED ORDER — SODIUM CHLORIDE 0.9 % IV BOLUS
1000.0000 mL | Freq: Once | INTRAVENOUS | Status: AC
  Administered 2018-04-24: 1000 mL via INTRAVENOUS

## 2018-04-24 NOTE — H&P (Signed)
[facility] [place] NAME: [name]    MR#:  [id number]  DATE OF BIRTH:  Aug 09, 1945  DATE OF ADMISSION:  04/24/2018  PRIMARY CARE PHYSICIAN: Marguerita Merles, MD   REQUESTING/REFERRING PHYSICIAN: Carrie Mew, MD  CHIEF COMPLAINT:   Chief Complaint  Patient presents with  . Emesis  . Fever    HISTORY OF PRESENT ILLNESS:  [name] [name]  is a 73 y.o. female with a known history of hypertension and type 2 diabetes who presented to the ED with chills, nausea, and "grogginess" that started this morning.  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  She is followed in wound clinic for this.  She has not noted any erythema or drainage from the wound.  The increased pain of the area.  She denies any shortness of breath, cough, dysuria, urinary frequency, or fevers.  Of note, she was seen in the ED on 04/20/2018 with a fall  In the ED, she was meeting sepsis criteria with fever of 100.14F and WBC 17.  Vitals were otherwise unremarkable.  Labs are significant for creatinine 1.92, procalcitonin 0.41.  UA was negative.  Chest x-ray was negative for pneumonia.  ED physician was able to express some purulent drainage from her wound and sent this drainage for wound culture.  She was started on empiric antibiotics.  Hospitalists were called for admission.  PAST MEDICAL HISTORY:   Past Medical History:  Diagnosis Date  . Diabetes mellitus without complication ([place])   . Hypertension     PAST SURGICAL HISTORY:  History reviewed. No pertinent surgical history.  SOCIAL HISTORY:   Social History   Tobacco Use  . Smoking status: Never Smoker  . Smokeless tobacco: Never Used  Substance Use Topics  . Alcohol use: No    FAMILY HISTORY:   Family History  Problem Relation Age of Onset  . Breast cancer Neg Hx     DRUG ALLERGIES:  No Known Allergies  REVIEW OF SYSTEMS:   Review of Systems  Constitutional: Positive for chills.  Negative for fever.  HENT: Negative for congestion and sore throat.   Eyes: Negative for blurred vision and double vision.  Respiratory: Negative for cough and shortness of breath.   Cardiovascular: Negative for chest pain, palpitations and leg swelling.  Gastrointestinal: Positive for nausea. Negative for abdominal pain and vomiting.  Genitourinary: Negative for dysuria and urgency.  Musculoskeletal: Positive for falls and joint pain. Negative for back pain and neck pain.  Neurological: Negative for dizziness and headaches.  Psychiatric/Behavioral: Negative for depression. The patient is not nervous/anxious.     MEDICATIONS AT HOME:   Prior to Admission medications   Medication Sig Start Date End Date Taking? Authorizing Provider  amLODipine (NORVASC) 10 MG tablet Take 1 tablet by mouth daily. 05/02/16  Yes [provider]  atorvastatin (LIPITOR) 40 MG tablet Take 1 tablet by mouth daily. 05/02/16  Yes [provider]  carvedilol (COREG) 25 MG tablet Take 1 tablet by mouth 2 (two) times daily. 05/02/16  Yes [provider]  KOMBIGLYZE XR 08-998 MG TB24 Take 1 tablet by mouth 2 (two) times daily. 05/06/16  Yes [provider]  quinapril (ACCUPRIL) 40 MG tablet Take 1 tablet by mouth 2 (two) times daily. 05/02/16  Yes [provider]  TANZEUM 30 MG PEN Inject 1 Dose into the skin daily. 05/10/16  Yes [provider]  oxyCODONE-acetaminophen (PERCOCET) 5-325 MG tablet Take  1 tablet by mouth every 4 (four) hours as needed for severe pain. Patient not taking: Reported on 04/24/2018 04/20/18 04/20/19  Laban Emperor, PA-C      VITAL SIGNS:  Blood pressure (!) 146/98, pulse 67, temperature (!) 100.7 F (38.2 C), temperature source Oral, resp. rate 17, weight 91 kg, SpO2 99 %.  PHYSICAL EXAMINATION:  Physical Exam  GENERAL:  73 y.o.-year-old patient lying in the bed with no acute distress.  EYES: Pupils equal, round, reactive to light and  accommodation. No scleral icterus. Extraocular muscles intact. + Bruise present around right eye HEENT: Head normocephalic. Oropharynx and nasopharynx clear.  NECK:  Supple, no jugular venous distention. No thyroid enlargement, no tenderness.  LUNGS: Normal breath sounds bilaterally, no wheezing, rales,rhonchi or crepitation. No use of accessory muscles of respiration.  CARDIOVASCULAR: RRR, S1, S2 normal. No murmurs, rubs, or gallops.  ABDOMEN: Soft, nontender, nondistended. Bowel sounds present. No organomegaly or mass.  EXTREMITIES: No pedal edema, cyanosis, or clubbing.  NEUROLOGIC: Cranial nerves II through XII are intact. Muscle strength 5/5 in all extremities. Sensation intact. Gait not checked.  PSYCHIATRIC: The patient is alert and oriented x 3.  SKIN: No obvious rash. Right heel lesion with minimal purulent drainage, as below.       LABORATORY PANEL:   CBC Recent Labs  Lab 04/24/18 2018  WBC 17.3*  HGB 10.7*  HCT 33.6*  PLT 191   ------------------------------------------------------------------------------------------------------------------  Chemistries  Recent Labs  Lab 04/24/18 2018  NA 138  K 4.0  CL 111  CO2 20*  GLUCOSE 69*  BUN 29*  CREATININE 1.92*  CALCIUM 8.9  AST 34  ALT 17  ALKPHOS 97  BILITOT 0.8   ------------------------------------------------------------------------------------------------------------------  Cardiac Enzymes No results for input(s): TROPONINI in the last 168 hours. ------------------------------------------------------------------------------------------------------------------  RADIOLOGY:  Dg Chest Port 1 View  Result Date: 04/24/2018 CLINICAL DATA:  Initial evaluation for acute emesis, fever. EXAM: PORTABLE CHEST 1 VIEW COMPARISON:  Prior radiograph from 04/20/2013. FINDINGS: Patient rotated to the right. Allowing for rotation, cardiomegaly grossly stable. Mediastinal silhouette within normal limits. Lungs mildly  hypoinflated. Mild right basilar atelectasis. No focal infiltrates. No edema or effusion. No pneumothorax. No acute osseous abnormality. IMPRESSION: 1. Shallow lung inflation with associated mild right basilar atelectasis. 2. No other active cardiopulmonary disease. Electronically Signed   By: Jeannine Boga M.D.   On: 04/24/2018 21:03   Dg Foot 2 Views Right  Result Date: 04/24/2018 CLINICAL DATA:  73 y/o  F; hear pain with. Limited drainage. EXAM: RIGHT FOOT - 2 VIEW COMPARISON:  None. FINDINGS: Stable chronic findings of deformed and disorganized hindfoot with lateral displacement of calcaneus in pseudoarticulation to fibula, downward displacement of the talus, destruction of subtalar joint, and surrounding heterotopic calcification. Midfoot degenerative changes with osteophytosis. Vascular calcifications noted. Severe osteoarthrosis of first metatarsophalangeal joint. Bones are demineralized. No bony destructive or erosive changes to suggest osteomyelitis. IMPRESSION: 1. No findings of osteomyelitis. 2. 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 Electronically Signed   By: Kristine Garbe M.D.   On: 04/24/2018 21:25    IMPRESSION AND PLAN:   Sepsis- likely secondary to right heel wound, although this does not appear to be overtly infected.  Meeting sepsis criteria on admission with fever to 100.43F and WBC 17.  UA and chest x-ray negative. X-ray right heel negative for osteo. -Continue empiric IV antibiotics -Follow-up blood, urine, and wound cultures -Check procalcitonin -Podiatry consult  Recent proximal spiral humeral shaft fracture- occurred after mechanical  fall 04/20/2018.  Shoulder immobilizer in place. -Needs to follow-up with ortho (Dr. Posey Pronto) as an outpatient 04/30/2018  Hypertension- BPs mildly elevated in the ED -Continue home norvasc, coreg, quinapril  Type 2 diabetes-blood sugar low in the ED. -Lantus 40 units bid starting  tomorrow -SSI  CKD III- stable, creatinine close to baseline -Monitor -Avoid nephrotoxic agents  Hyperlipidemia- stable -Continue home lipitor  Hypothyroidism- stable -Continue home synthroid  Anemia of chronic kidney disease- hemoglobin at baseline -Monitor  All the records are reviewed and case discussed with ED provider. Management plans discussed with the patient, family and they are in agreement.  CODE STATUS: Full  TOTAL TIME TAKING CARE OF THIS PATIENT: 45 minutes.    Berna Spare Rajanae Mantia M.D on 04/24/2018 at 11:17 PM  Between 7am to 6pm - Pager - [phone]  After 6pm go to [email or web address] - Proofreader  [facility]  Office  [phone]  CC: Primary care physician; Marguerita Merles, MD   Note: This dictation was prepared with Dragon dictation along with smaller phrase technology. Any transcriptional errors that result from this process are unintentional.

## 2018-04-24 NOTE — ED Triage Notes (Signed)
Patient coming [facility] from home for N/V/D. Patient dx with left humoral fracture on Monday. Patient febrile for EMS in transport with fever 101.5.

## 2018-04-24 NOTE — ED Provider Notes (Addendum)
[facility] Emergency Department Provider Note  ____________________________________________  Time seen: Approximately 11:06 PM  I have reviewed the triage vital signs and the nursing notes.   HISTORY  Chief Complaint Emesis and Fever    HPI [name] [name] is a 73 y.o. female with a history of diabetes and hypertension comes the ED due to fever and weakness.  Patient was recently in the ED with a fall, found to have a humerus fracture 4 days ago.  Discharged home.  She also notes that she has a chronic wound on her right heel that she sees the wound care center for and overall seems to be improving.  Not currently on any antibiotics.   She also reports having intermittent abdominal pain vomiting and diarrhea over the last 2 days.   Past Medical History:  Diagnosis Date  . Diabetes mellitus without complication ([place])   . Hypertension      There are no active problems to display for this patient.    History reviewed. No pertinent surgical history.   Prior to Admission medications   Medication Sig Start Date End Date Taking? Authorizing Provider  amLODipine (NORVASC) 10 MG tablet Take 1 tablet by mouth daily. 05/02/16  Yes [provider]  atorvastatin (LIPITOR) 40 MG tablet Take 1 tablet by mouth daily. 05/02/16  Yes [provider]  carvedilol (COREG) 25 MG tablet Take 1 tablet by mouth 2 (two) times daily. 05/02/16  Yes [provider]  insulin glargine (LANTUS) 100 UNIT/ML injection Inject into the skin daily.   Yes [provider]  KOMBIGLYZE XR 08-998 MG TB24 Take 1 tablet by mouth 2 (two) times daily. 05/06/16  Yes [provider]  quinapril (ACCUPRIL) 40 MG tablet Take 1 tablet by mouth 2 (two) times daily. 05/02/16  Yes [provider]  TANZEUM 30 MG PEN Inject 1 Dose into the skin daily. 05/10/16  Yes [provider]  donepezil (ARICEPT) 10 MG tablet Take 10 mg by mouth daily. 04/21/18    [provider]  oxyCODONE-acetaminophen (PERCOCET) 5-325 MG tablet Take 1 tablet by mouth every 4 (four) hours as needed for severe pain. Patient not taking: Reported on 04/24/2018 04/20/18 04/20/19  Laban Emperor, PA-C  SYNTHROID 50 MCG tablet Take 1 tablet by mouth daily. 04/21/18   [provider]  TRESIBA FLEXTOUCH 100 UNIT/ML SOPN FlexTouch Pen Inject 10 Units into the skin 2 (two) times daily. 04/21/18   [provider]     Allergies Patient has no known allergies.   Family History  Problem Relation Age of Onset  . Breast cancer Neg Hx     Social History Social History   Tobacco Use  . Smoking status: Never Smoker  . Smokeless tobacco: Never Used  Substance Use Topics  . Alcohol use: No  . Drug use: Not on file    Review of Systems  Constitutional: Positive fever ENT:   No sore throat. No rhinorrhea. Cardiovascular:   No chest pain or syncope. Respiratory:   No dyspnea or cough. Gastrointestinal:   Positive intermittent abdominal pain vomiting and diarrhea.  Musculoskeletal: Chronic right foot wound All other systems reviewed and are negative except as documented above in ROS and HPI.  ____________________________________________   PHYSICAL EXAM:  VITAL SIGNS: ED Triage Vitals  Enc Vitals Group     BP 04/24/18 2014 (!) 172/76     Pulse Rate 04/24/18 2014 95     Resp 04/24/18 2014 20  Temp 04/24/18 2014 (!) 102.8 F (39.3 C)     Temp Source 04/24/18 2014 Oral     SpO2 04/24/18 2014 100 %     Weight 04/24/18 2011 200 lb 9.9 oz (91 kg)     Height --      Head Circumference --      Peak Flow --      Pain Score 04/24/18 2012 7     Pain Loc --      Pain Edu? --      Excl. in [place]? --     Vital signs reviewed, nursing assessments reviewed.   Constitutional:   Alert and oriented.  Ill-appearing Eyes:   Conjunctivae are normal. EOMI. PERRL. ENT      Head:   Normocephalic and atraumatic.      Nose:   No congestion/rhinnorhea.        Mouth/Throat:   Dry mucous membranes, no pharyngeal erythema. No peritonsillar mass.       Neck:   No meningismus. Full ROM. Hematological/Lymphatic/Immunilogical:   No cervical lymphadenopathy. Cardiovascular:   RRR. Symmetric bilateral radial and DP pulses.  No murmurs. Cap refill less than 2 seconds. Respiratory:   Normal respiratory effort without tachypnea/retractions. Breath sounds are clear and equal bilaterally. No wheezes/rales/rhonchi. Gastrointestinal:   Soft and nontender. Non distended. There is no CVA tenderness.  No rebound, rigidity, or guarding.  Musculoskeletal:   Normal range of motion in all extremities. No joint effusions.  There is a less than 1 cm wound over the plantar right heel.  There is some tenderness in the area but no frank inflammatory changes.  Manipulation of the area does express a small amount of purulent fluid.  No crepitus.  No streaking or calf tenderness. Neurologic:   Normal speech and language.  Motor grossly intact. No acute focal neurologic deficits are appreciated.  Skin:    Skin is warm, dry with healed wound as above no other wounds.. No rash noted.  No petechiae, purpura, or bullae.  ____________________________________________    LABS (pertinent positives/negatives) (all labs ordered are listed, but only abnormal results are displayed) Labs Reviewed  COMPREHENSIVE METABOLIC PANEL - Abnormal; Notable for the following components:      Result Value   CO2 20 (*)    Glucose, Bld 69 (*)    BUN 29 (*)    Creatinine, Ser 1.92 (*)    Total Protein 8.5 (*)    GFR calc non Af Amer 26 (*)    GFR calc Af Amer 30 (*)    All other components within normal limits  CBC - Abnormal; Notable for the following components:   WBC 17.3 (*)    RBC 3.70 (*)    Hemoglobin 10.7 (*)    HCT 33.6 (*)    All other components within normal limits  CULTURE, BLOOD (ROUTINE X 2)  CULTURE, BLOOD (ROUTINE X 2)  AEROBIC CULTURE (SUPERFICIAL SPECIMEN)  LIPASE,  BLOOD  INFLUENZA PANEL BY PCR (TYPE A & B)  LACTIC ACID, PLASMA  PROCALCITONIN  URINALYSIS, COMPLETE (UACMP) WITH MICROSCOPIC   ____________________________________________   EKG    ____________________________________________    RADIOLOGY  Dg Chest Port 1 View  Result Date: 04/24/2018 CLINICAL DATA:  Initial evaluation for acute emesis, fever. EXAM: PORTABLE CHEST 1 VIEW COMPARISON:  Prior radiograph from 04/20/2013. FINDINGS: Patient rotated to the right. Allowing for rotation, cardiomegaly grossly stable. Mediastinal silhouette within normal limits. Lungs mildly hypoinflated. Mild right basilar atelectasis. No focal infiltrates. No edema  or effusion. No pneumothorax. No acute osseous abnormality. IMPRESSION: 1. Shallow lung inflation with associated mild right basilar atelectasis. 2. No other active cardiopulmonary disease. Electronically Signed   By: Jeannine Boga M.D.   On: 04/24/2018 21:03   Dg Foot 2 Views Right  Result Date: 04/24/2018 CLINICAL DATA:  73 y/o  F; hear pain with. Limited drainage. EXAM: RIGHT FOOT - 2 VIEW COMPARISON:  None. FINDINGS: Stable chronic findings of deformed and disorganized hindfoot with lateral displacement of calcaneus in pseudoarticulation to fibula, downward displacement of the talus, destruction of subtalar joint, and surrounding heterotopic calcification. Midfoot degenerative changes with osteophytosis. Vascular calcifications noted. Severe osteoarthrosis of first metatarsophalangeal joint. Bones are demineralized. No bony destructive or erosive changes to suggest osteomyelitis. IMPRESSION: 1. No findings of osteomyelitis. 2. 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 Electronically Signed   By: Kristine Garbe M.D.   On: 04/24/2018 21:25    ____________________________________________   PROCEDURES .Critical Care Performed by: Carrie Mew, MD Authorized by: Carrie Mew, MD    Critical care provider statement:    Critical care time (minutes):  35   Critical care time was exclusive of:  Separately billable procedures and treating other patients   Critical care was necessary to treat or prevent imminent or life-threatening deterioration of the following conditions:  Sepsis   Critical care was time spent personally by me on the following activities:  Development of treatment plan with patient or surrogate, discussions with consultants, evaluation of patient's response to treatment, examination of patient, obtaining history from patient or surrogate, ordering and performing treatments and interventions, ordering and review of laboratory studies, ordering and review of radiographic studies, pulse oximetry, re-evaluation of patient's condition and review of old charts    ____________________________________________    CLINICAL IMPRESSION / [place] / ED COURSE  Pertinent labs & imaging results that were available during my care of the patient were reviewed by me and considered in my medical decision making (see chart for details).      Clinical Course as of Apr 24 2318  Fri Apr 24, 2018  2028 Presents with high fever and tachypnea with a respiratory rate of 24.  Differential includes pneumonia, urinary tract infection, viral gastroenteritis, influenza, or diabetic foot infection with the somewhat purulent drainage from the foot wound even though the foot itself does not look hot.  Start broad-spectrum coverage with vancomycin cefepime and Flagyl.  1 L bolus, no signs of shock currently, we will follow-up lactate level.   [PS]    Clinical Course User Index [PS] Carrie Mew, MD    ----------------------------------------- 11:19 PM on 04/24/2018 -----------------------------------------  Lactate normal.  Work-up unremarkable so far except leukocytosis of 17,000.  Case discussed with the hospitalist for further evaluation and  management   ____________________________________________   FINAL CLINICAL IMPRESSION(S) / ED DIAGNOSES    Final diagnoses:  Sepsis, due to unspecified organism, unspecified whether acute organ dysfunction present ([place])  Nausea vomiting and diarrhea  Dehydration     ED Discharge Orders    None      Portions of this note were generated with dragon dictation software. Dictation errors may occur despite best attempts at proofreading.   Carrie Mew, MD 04/24/18 2320    Carrie Mew, MD 05/07/18 [phone]

## 2018-04-24 NOTE — Progress Notes (Signed)
CODE SEPSIS - PHARMACY COMMUNICATION  **Broad Spectrum Antibiotics should be administered within 1 hour of Sepsis diagnosis**  Time Code Sepsis Called/Page Received:  1/17 @ 20:27   Antibiotics Ordered: Cefepime, Vancomycin   Time of 1st antibiotic administration: 1/17 @ 2046  Additional action taken by pharmacy: none  If necessary, Name of Provider/Nurse Contacted:     Nayzeth Altman D ,PharmD Clinical Pharmacist  04/24/2018  8:53 PM

## 2018-04-25 ENCOUNTER — Other Ambulatory Visit: Payer: Self-pay

## 2018-04-25 DIAGNOSIS — E785 Hyperlipidemia, unspecified: Secondary | ICD-10-CM | POA: Diagnosis not present

## 2018-04-25 DIAGNOSIS — E11610 Type 2 diabetes mellitus with diabetic neuropathic arthropathy: Secondary | ICD-10-CM | POA: Diagnosis not present

## 2018-04-25 DIAGNOSIS — N183 Chronic kidney disease, stage 3 (moderate): Secondary | ICD-10-CM | POA: Diagnosis not present

## 2018-04-25 DIAGNOSIS — S42302D Unspecified fracture of shaft of humerus, left arm, subsequent encounter for fracture with routine healing: Secondary | ICD-10-CM | POA: Diagnosis not present

## 2018-04-25 DIAGNOSIS — Y92230 Patient room in hospital as the place of occurrence of the external cause: Secondary | ICD-10-CM | POA: Diagnosis not present

## 2018-04-25 DIAGNOSIS — A419 Sepsis, unspecified organism: Secondary | ICD-10-CM | POA: Diagnosis present

## 2018-04-25 DIAGNOSIS — E1122 Type 2 diabetes mellitus with diabetic chronic kidney disease: Secondary | ICD-10-CM | POA: Diagnosis not present

## 2018-04-25 DIAGNOSIS — E039 Hypothyroidism, unspecified: Secondary | ICD-10-CM | POA: Diagnosis not present

## 2018-04-25 DIAGNOSIS — E11621 Type 2 diabetes mellitus with foot ulcer: Secondary | ICD-10-CM | POA: Diagnosis not present

## 2018-04-25 DIAGNOSIS — E860 Dehydration: Secondary | ICD-10-CM | POA: Diagnosis present

## 2018-04-25 DIAGNOSIS — R110 Nausea: Secondary | ICD-10-CM | POA: Diagnosis not present

## 2018-04-25 DIAGNOSIS — Z794 Long term (current) use of insulin: Secondary | ICD-10-CM | POA: Diagnosis not present

## 2018-04-25 DIAGNOSIS — Z9181 History of falling: Secondary | ICD-10-CM | POA: Diagnosis not present

## 2018-04-25 DIAGNOSIS — Z23 Encounter for immunization: Secondary | ICD-10-CM | POA: Diagnosis not present

## 2018-04-25 DIAGNOSIS — D631 Anemia in chronic kidney disease: Secondary | ICD-10-CM | POA: Diagnosis not present

## 2018-04-25 DIAGNOSIS — T3695XA Adverse effect of unspecified systemic antibiotic, initial encounter: Secondary | ICD-10-CM | POA: Diagnosis not present

## 2018-04-25 DIAGNOSIS — L97419 Non-pressure chronic ulcer of right heel and midfoot with unspecified severity: Secondary | ICD-10-CM | POA: Diagnosis not present

## 2018-04-25 DIAGNOSIS — W19XXXD Unspecified fall, subsequent encounter: Secondary | ICD-10-CM | POA: Diagnosis not present

## 2018-04-25 DIAGNOSIS — Z79899 Other long term (current) drug therapy: Secondary | ICD-10-CM | POA: Diagnosis not present

## 2018-04-25 DIAGNOSIS — Z79890 Hormone replacement therapy (postmenopausal): Secondary | ICD-10-CM | POA: Diagnosis not present

## 2018-04-25 DIAGNOSIS — I129 Hypertensive chronic kidney disease with stage 1 through stage 4 chronic kidney disease, or unspecified chronic kidney disease: Secondary | ICD-10-CM | POA: Diagnosis not present

## 2018-04-25 LAB — CBC
HCT: 28.8 % — ABNORMAL LOW (ref 36.0–46.0)
Hemoglobin: 9.1 g/dL — ABNORMAL LOW (ref 12.0–15.0)
MCH: 29 pg (ref 26.0–34.0)
MCHC: 31.6 g/dL (ref 30.0–36.0)
MCV: 91.7 fL (ref 80.0–100.0)
Platelets: 163 10*3/uL (ref 150–400)
RBC: 3.14 MIL/uL — ABNORMAL LOW (ref 3.87–5.11)
RDW: 13.5 % (ref 11.5–15.5)
WBC: 13.9 10*3/uL — ABNORMAL HIGH (ref 4.0–10.5)
nRBC: 0 % (ref 0.0–0.2)

## 2018-04-25 LAB — URINALYSIS, COMPLETE (UACMP) WITH MICROSCOPIC
Bacteria, UA: NONE SEEN
Bilirubin Urine: NEGATIVE
Glucose, UA: NEGATIVE mg/dL
Hgb urine dipstick: NEGATIVE
Ketones, ur: NEGATIVE mg/dL
Leukocytes, UA: NEGATIVE
Nitrite: NEGATIVE
Protein, ur: 30 mg/dL — AB
Specific Gravity, Urine: 1.015 (ref 1.005–1.030)
pH: 5 (ref 5.0–8.0)

## 2018-04-25 LAB — BASIC METABOLIC PANEL
Anion gap: 8 (ref 5–15)
BUN: 31 mg/dL — ABNORMAL HIGH (ref 8–23)
CO2: 17 mmol/L — ABNORMAL LOW (ref 22–32)
Calcium: 7.9 mg/dL — ABNORMAL LOW (ref 8.9–10.3)
Chloride: 110 mmol/L (ref 98–111)
Creatinine, Ser: 1.95 mg/dL — ABNORMAL HIGH (ref 0.44–1.00)
GFR calc Af Amer: 29 mL/min — ABNORMAL LOW (ref >60)
GFR calc non Af Amer: 25 mL/min — ABNORMAL LOW (ref >60)
Glucose, Bld: 176 mg/dL — ABNORMAL HIGH (ref 70–99)
Potassium: 3.7 mmol/L (ref 3.5–5.1)
Sodium: 135 mmol/L (ref 135–145)

## 2018-04-25 LAB — GLUCOSE, CAPILLARY
Glucose-Capillary: 124 mg/dL — ABNORMAL HIGH (ref 70–99)
Glucose-Capillary: 165 mg/dL — ABNORMAL HIGH (ref 70–99)
Glucose-Capillary: 153 mg/dL — ABNORMAL HIGH (ref 70–99)
Glucose-Capillary: 124 mg/dL — ABNORMAL HIGH (ref 70–99)

## 2018-04-25 MED ORDER — DONEPEZIL HCL 5 MG PO TABS
10.0000 mg | ORAL_TABLET | Freq: Every day | ORAL | Status: DC
  Administered 2018-04-25 – 2018-04-27 (×3): 10 mg via ORAL
  Filled 2018-04-25 (×3): qty 2

## 2018-04-25 MED ORDER — AMLODIPINE BESYLATE 10 MG PO TABS
10.0000 mg | ORAL_TABLET | Freq: Every day | ORAL | Status: DC
  Administered 2018-04-25 – 2018-04-27 (×3): 10 mg via ORAL
  Filled 2018-04-25 (×4): qty 1

## 2018-04-25 MED ORDER — VANCOMYCIN HCL IN DEXTROSE 1-5 GM/200ML-% IV SOLN
1000.0000 mg | INTRAVENOUS | Status: DC
  Administered 2018-04-25 – 2018-04-26 (×2): 1000 mg via INTRAVENOUS
  Filled 2018-04-25 (×3): qty 200

## 2018-04-25 MED ORDER — VANCOMYCIN HCL 10 G IV SOLR: 1500.0000 mg | INTRAVENOUS | Status: DC

## 2018-04-25 MED ORDER — ACETAMINOPHEN 325 MG PO TABS
650.0000 mg | ORAL_TABLET | Freq: Four times a day (QID) | ORAL | Status: DC | PRN
  Administered 2018-04-27: 650 mg via ORAL
  Filled 2018-04-25: qty 2

## 2018-04-25 MED ORDER — HEPARIN SODIUM (PORCINE) 5000 UNIT/ML IJ SOLN
5000.0000 [IU] | Freq: Three times a day (TID) | INTRAMUSCULAR | Status: DC
  Administered 2018-04-25 – 2018-04-27 (×7): 5000 [IU] via SUBCUTANEOUS
  Filled 2018-04-25 (×7): qty 1

## 2018-04-25 MED ORDER — ONDANSETRON HCL 4 MG/2ML IJ SOLN
4.0000 mg | Freq: Four times a day (QID) | INTRAMUSCULAR | Status: DC | PRN
  Administered 2018-04-26 – 2018-04-27 (×2): 4 mg via INTRAVENOUS
  Filled 2018-04-25 (×2): qty 2

## 2018-04-25 MED ORDER — INSULIN ASPART 100 UNIT/ML ~~LOC~~ SOLN: 0.0000 [IU] | Freq: Every day | SUBCUTANEOUS | Status: DC

## 2018-04-25 MED ORDER — ATORVASTATIN CALCIUM 20 MG PO TABS
40.0000 mg | ORAL_TABLET | Freq: Every day | ORAL | Status: DC
  Administered 2018-04-25 – 2018-04-26 (×2): 40 mg via ORAL
  Filled 2018-04-25 (×2): qty 2

## 2018-04-25 MED ORDER — VANCOMYCIN HCL 10 G IV SOLR
1250.0000 mg | INTRAVENOUS | Status: DC
  Filled 2018-04-25: qty 1250

## 2018-04-25 MED ORDER — ACETAMINOPHEN 650 MG RE SUPP: 650.0000 mg | Freq: Four times a day (QID) | RECTAL | Status: DC | PRN

## 2018-04-25 MED ORDER — ONDANSETRON HCL 4 MG PO TABS: 4.0000 mg | ORAL_TABLET | Freq: Four times a day (QID) | ORAL | Status: DC | PRN

## 2018-04-25 MED ORDER — VANCOMYCIN HCL IN DEXTROSE 1-5 GM/200ML-% IV SOLN
1000.0000 mg | INTRAVENOUS | Status: DC
  Filled 2018-04-25: qty 200

## 2018-04-25 MED ORDER — CARVEDILOL 25 MG PO TABS
25.0000 mg | ORAL_TABLET | Freq: Two times a day (BID) | ORAL | Status: DC
  Administered 2018-04-25 – 2018-04-27 (×5): 25 mg via ORAL
  Filled 2018-04-25 (×6): qty 1

## 2018-04-25 MED ORDER — DIPHENHYDRAMINE HCL 25 MG PO CAPS: 25.0000 mg | ORAL_CAPSULE | Freq: Four times a day (QID) | ORAL | Status: DC | PRN

## 2018-04-25 MED ORDER — INSULIN ASPART 100 UNIT/ML ~~LOC~~ SOLN
0.0000 [IU] | Freq: Three times a day (TID) | SUBCUTANEOUS | Status: DC
  Administered 2018-04-25: 2 [IU] via SUBCUTANEOUS
  Administered 2018-04-25: 1 [IU] via SUBCUTANEOUS
  Administered 2018-04-25 – 2018-04-26 (×2): 2 [IU] via SUBCUTANEOUS
  Administered 2018-04-26: 1 [IU] via SUBCUTANEOUS
  Administered 2018-04-27: 3 [IU] via SUBCUTANEOUS
  Filled 2018-04-25 (×6): qty 1

## 2018-04-25 MED ORDER — POLYETHYLENE GLYCOL 3350 17 G PO PACK: 17.0000 g | PACK | Freq: Every day | ORAL | Status: DC | PRN

## 2018-04-25 MED ORDER — LEVOTHYROXINE SODIUM 50 MCG PO TABS
50.0000 ug | ORAL_TABLET | Freq: Every day | ORAL | Status: DC
  Administered 2018-04-25 – 2018-04-27 (×3): 50 ug via ORAL
  Filled 2018-04-25 (×3): qty 1

## 2018-04-25 MED ORDER — PNEUMOCOCCAL VAC POLYVALENT 25 MCG/0.5ML IJ INJ
0.5000 mL | INJECTION | INTRAMUSCULAR | Status: AC
  Administered 2018-04-26: 0.5 mL via INTRAMUSCULAR
  Filled 2018-04-25: qty 0.5

## 2018-04-25 MED ORDER — SODIUM CHLORIDE 0.9 % IV SOLN
INTRAVENOUS | Status: DC | PRN
  Administered 2018-04-25: 250 mL via INTRAVENOUS

## 2018-04-25 MED ORDER — INSULIN GLARGINE 100 UNIT/ML ~~LOC~~ SOLN
40.0000 [IU] | Freq: Every day | SUBCUTANEOUS | Status: DC
  Administered 2018-04-26: 40 [IU] via SUBCUTANEOUS
  Filled 2018-04-25 (×2): qty 0.4

## 2018-04-25 MED ORDER — SODIUM CHLORIDE 0.9 % IV SOLN
2.0000 g | Freq: Two times a day (BID) | INTRAVENOUS | Status: DC
  Administered 2018-04-25 – 2018-04-26 (×3): 2 g via INTRAVENOUS
  Filled 2018-04-25 (×4): qty 2

## 2018-04-25 MED ORDER — QUINAPRIL HCL 10 MG PO TABS
40.0000 mg | ORAL_TABLET | Freq: Two times a day (BID) | ORAL | Status: DC
  Administered 2018-04-25 – 2018-04-27 (×5): 40 mg via ORAL
  Filled 2018-04-25 (×7): qty 4

## 2018-04-25 NOTE — ED Notes (Signed)
Eyvonne Mechanic RN, aware of bed assigned

## 2018-04-25 NOTE — Progress Notes (Addendum)
[place] at [facility]                                                                                                                                                                                  Patient Demographics   [name] [name], is a 73 y.o. female, DOB - 26-Aug-1945, UVO:[id number]  Admit date - 04/24/2018   Admitting Physician Sela Hua, MD  Outpatient Primary MD for the patient is Marguerita Merles, MD   LOS - 0  Subjective: Patient admitted with fever and sepsis She complains of itching in the left arm where she has a fracture   Review of Systems:   CONSTITUTIONAL: No documented positive fever.  Positive fatigue, positive weakness. No weight gain, no weight loss.  EYES: No blurry or double vision.  ENT: No tinnitus. No postnasal drip. No redness of the oropharynx.  RESPIRATORY: No cough, no wheeze, no hemoptysis. No dyspnea.  CARDIOVASCULAR: No chest pain. No orthopnea. No palpitations. No syncope.  GASTROINTESTINAL: No nausea, no vomiting or diarrhea. No abdominal pain. No melena or hematochezia.  GENITOURINARY: No dysuria or hematuria.  ENDOCRINE: No polyuria or nocturia. No heat or cold intolerance.  HEMATOLOGY: No anemia. No bruising. No bleeding.  INTEGUMENTARY: No rashes. No lesions.  MUSCULOSKELETAL: No arthritis. No swelling. No gout.  NEUROLOGIC: No numbness, tingling, or ataxia. No seizure-type activity.  PSYCHIATRIC: No anxiety. No insomnia. No ADD.    Vitals:   Vitals:   04/25/18 0051 04/25/18 0234 04/25/18 0547 04/25/18 1143  BP: (!) 142/80 135/75 140/62 (!) 136/53  Pulse: 68 62 61 63  Resp: 19 20 20 16   Temp: 98.9 F (37.2 C) 98.8 F (37.1 C) 98.7 F (37.1 C) 99.8 F (37.7 C)  TempSrc: Oral Oral Oral Oral  SpO2: 98% 99% 100% 100%  Weight:  91 kg    Height:  5\' 7"  (1.702 m)      Wt Readings from Last 3 Encounters:  04/25/18 91 kg  04/20/18 90.7 kg  01/03/18 88.9 kg     Intake/Output Summary (Last 24 hours)  at 04/25/2018 1322 Last data filed at 04/25/2018 1011 Gross per 24 hour  Intake 480 ml  Output -  Net 480 ml    Physical Exam:   GENERAL: Pleasant-appearing in no apparent distress.  HEAD, EYES, EARS, NOSE AND THROAT: Atraumatic, normocephalic. Extraocular muscles are intact. Pupils equal and reactive to light. Sclerae anicteric. No conjunctival injection. No oro-pharyngeal erythema.  NECK: Supple. There is no jugular venous distention. No bruits, no lymphadenopathy, no thyromegaly.  HEART: Regular rate and rhythm,. No murmurs, no rubs, no clicks.  LUNGS: Clear to auscultation bilaterally. No rales or  rhonchi. No wheezes.  ABDOMEN: Soft, flat, nontender, nondistended. Has good bowel sounds. No hepatosplenomegaly appreciated.  EXTREMITIES: Left forearm with brace in place nEUROLOGIC: The patient is alert, awake, and oriented x3 with no focal motor or sensory deficits appreciated bilaterally.  SKIN: Right heel lesion with ulceration.  Psych: Not anxious, depressed LN: No inguinal LN enlargement    Antibiotics   Anti-infectives (From admission, onward)   Start     Dose/Rate Route Frequency Ordered Stop   04/25/18 0900  ceFEPIme (MAXIPIME) 2 g in sodium chloride 0.9 % 100 mL IVPB     2 g 200 mL/hr over 30 Minutes Intravenous Every 12 hours 04/25/18 0507     04/25/18 0515  vancomycin (VANCOCIN) 1,500 mg in sodium chloride 0.9 % 500 mL IVPB  Status:  Discontinued     1,500 mg 250 mL/hr over 120 Minutes Intravenous Every 24 hours 04/25/18 0507 04/25/18 0512   04/25/18 0515  vancomycin (VANCOCIN) 1,250 mg in sodium chloride 0.9 % 250 mL IVPB  Status:  Discontinued     1,250 mg 166.7 mL/hr over 90 Minutes Intravenous Every 24 hours 04/25/18 0512 04/25/18 0514   04/25/18 0515  vancomycin (VANCOCIN) IVPB 1000 mg/200 mL premix  Status:  Discontinued     1,000 mg 200 mL/hr over 60 Minutes Intravenous Every 36 hours 04/25/18 0514 04/25/18 0514   04/25/18 0515  vancomycin (VANCOCIN) IVPB 1000  mg/200 mL premix     1,000 mg 200 mL/hr over 60 Minutes Intravenous Every 24 hours 04/25/18 0514     04/24/18 2030  ceFEPIme (MAXIPIME) 2 g in sodium chloride 0.9 % 100 mL IVPB     2 g 200 mL/hr over 30 Minutes Intravenous  Once 04/24/18 2027 04/24/18 2116   04/24/18 2030  metroNIDAZOLE (FLAGYL) IVPB 500 mg  Status:  Discontinued     500 mg 100 mL/hr over 60 Minutes Intravenous Every 8 hours 04/24/18 2027 04/25/18 0211   04/24/18 2030  vancomycin (VANCOCIN) IVPB 1000 mg/200 mL premix     1,000 mg 200 mL/hr over 60 Minutes Intravenous  Once 04/24/18 2027 04/24/18 2216      Medications   Scheduled Meds: . amLODipine  10 mg Oral Daily  . atorvastatin  40 mg Oral Daily  . carvedilol  25 mg Oral BID  . donepezil  10 mg Oral Daily  . heparin  5,000 Units Subcutaneous Q8H  . insulin aspart  0-5 Units Subcutaneous QHS  . insulin aspart  0-9 Units Subcutaneous TID WC  . [START ON 04/26/2018] insulin glargine  40 Units Subcutaneous QHS  . levothyroxine  50 mcg Oral QAC breakfast  . [START ON 04/26/2018] pneumococcal 23 valent vaccine  0.5 mL Intramuscular Tomorrow-1000  . quinapril  40 mg Oral BID   Continuous Infusions: . sodium chloride 250 mL (04/25/18 0609)  . ceFEPime (MAXIPIME) IV 2 g (04/25/18 1116)  . vancomycin 1,000 mg (04/25/18 0611)   PRN Meds:.sodium chloride, acetaminophen **OR** acetaminophen, diphenhydrAMINE, ondansetron **OR** ondansetron (ZOFRAN) IV, polyethylene glycol   Data Review:   Micro Results Recent Results (from the past 240 hour(s))  Culture, blood (Routine x 2)     Status: None (Preliminary result)   Collection Time: 04/24/18  8:20 PM  Result Value Ref Range Status   Specimen Description BLOOD RIGHT ANTECUBITAL  Final   Special Requests   Final    BOTTLES DRAWN AEROBIC AND ANAEROBIC Blood Culture results may not be optimal due to an excessive volume of blood received in culture  bottles   Culture   Final    NO GROWTH < 12 HOURS Performed at [facility], [address] [place], [place] [address]    Report Status PENDING  Incomplete  Culture, blood (Routine x 2)     Status: None (Preliminary result)   Collection Time: 04/24/18  8:20 PM  Result Value Ref Range Status   Specimen Description BLOOD RIGHT ARM  Final   Special Requests   Final    BOTTLES DRAWN AEROBIC AND ANAEROBIC Blood Culture results may not be optimal due to an excessive volume of blood received in culture bottles   Culture   Final    NO GROWTH < 12 HOURS Performed at [facility], [address], [place], [place] [address]    Report Status PENDING  Incomplete  Wound or Superficial Culture     Status: None (Preliminary result)   Collection Time: 04/24/18  8:20 PM  Result Value Ref Range Status   Specimen Description   Final    WOUND RIGHT FOOT Performed at [facility], [address] [place], [place] [address]    Special Requests   Final    NONE Performed at [facility], [address] [place], [place] [address]    Gram Stain   Final    MODERATE WBC PRESENT, PREDOMINANTLY PMN MODERATE GRAM POSITIVE COCCI RARE GRAM VARIABLE ROD    Culture   Final    TOO YOUNG TO READ Performed at [facility], [place] [address], [place], [place] [address]    Report Status PENDING  Incomplete    Radiology Reports Ct Head Wo Contrast  Result Date: 04/20/2018 CLINICAL DATA:  Pain after fall. EXAM: CT HEAD WITHOUT CONTRAST CT CERVICAL SPINE WITHOUT CONTRAST TECHNIQUE: Multidetector CT imaging of the head and cervical spine was performed following the standard protocol without intravenous contrast. Multiplanar CT image reconstructions of the cervical spine were also generated. COMPARISON:  CT of the brain May 28, 2017 FINDINGS: CT HEAD FINDINGS Brain: No subdural, epidural, or subarachnoid hemorrhage. Ventricles and sulci are stable. Cerebellum, brainstem, and basal cisterns are normal. No acute cortical ischemia or infarct  identified. No mass effect or midline shift. Chronic white matter changes. Vascular: Calcified atherosclerosis in the intracranial carotids. Skull: Normal. Negative for fracture or focal lesion. Sinuses/Orbits: No acute finding. Other: Soft tissue swelling in the right forehead and supraorbital region. The globes are intact within visualize limits. Extracranial soft tissues otherwise normal. CT CERVICAL SPINE FINDINGS Alignment: Normal. Skull base and vertebrae: No acute fracture. No primary bone lesion or focal pathologic process. Soft tissues and spinal canal: No prevertebral fluid or swelling. No visible canal hematoma. Disc levels:  Multilevel degenerative changes. Upper chest: Negative. Other: No other abnormalities identified. IMPRESSION: 1. No acute intracranial abnormalities. Chronic white matter changes. 2. Soft tissue swelling over the right forehead and supraorbital region. 3. No fracture or traumatic malalignment in the cervical spine. Mild multilevel degenerative changes. Electronically Signed   By: Dorise Bullion III M.D   On: 04/20/2018 19:27   Ct Cervical Spine Wo Contrast  Result Date: 04/20/2018 CLINICAL DATA:  Pain after fall. EXAM: CT HEAD WITHOUT CONTRAST CT CERVICAL SPINE WITHOUT CONTRAST TECHNIQUE: Multidetector CT imaging of the head and cervical spine was performed following the standard protocol without intravenous contrast. Multiplanar CT image reconstructions of the cervical spine were also generated. COMPARISON:  CT of the brain May 28, 2017 FINDINGS: CT HEAD FINDINGS Brain: No subdural, epidural, or  subarachnoid hemorrhage. Ventricles and sulci are stable. Cerebellum, brainstem, and basal cisterns are normal. No acute cortical ischemia or infarct identified. No mass effect or midline shift. Chronic white matter changes. Vascular: Calcified atherosclerosis in the intracranial carotids. Skull: Normal. Negative for fracture or focal lesion. Sinuses/Orbits: No acute finding.  Other: Soft tissue swelling in the right forehead and supraorbital region. The globes are intact within visualize limits. Extracranial soft tissues otherwise normal. CT CERVICAL SPINE FINDINGS Alignment: Normal. Skull base and vertebrae: No acute fracture. No primary bone lesion or focal pathologic process. Soft tissues and spinal canal: No prevertebral fluid or swelling. No visible canal hematoma. Disc levels:  Multilevel degenerative changes. Upper chest: Negative. Other: No other abnormalities identified. IMPRESSION: 1. No acute intracranial abnormalities. Chronic white matter changes. 2. Soft tissue swelling over the right forehead and supraorbital region. 3. No fracture or traumatic malalignment in the cervical spine. Mild multilevel degenerative changes. Electronically Signed   By: Dorise Bullion III M.D   On: 04/20/2018 19:27   Ct Shoulder Left Wo Contrast  Result Date: 04/20/2018 CLINICAL DATA:  Tripped and fell today, LEFT shoulder fracture EXAM: CT OF THE UPPER LEFT EXTREMITY WITHOUT CONTRAST TECHNIQUE: Multidetector CT imaging of the upper left extremity was performed according to the standard protocol. Sagittal and coronal MPR images reconstructed from axial data set. COMPARISON:  None. FINDINGS: Bones/Joint/Cartilage Osseous demineralization. AC joint alignment normal with degenerative changes noted. Visualized ribs intact. Scapula intact. Comminuted nondisplaced fracture of the proximal to mid LEFT humeral diaphysis. Tiny avulsion fracture is seen from the margin of the lesser tuberosity. Additionally, nondisplaced fracture plane identified at the greater tuberosity. No dislocation. No destructive bone lesions. No significant joint effusion. Ligaments Suboptimally assessed by CT. Muscles and Tendons Muscular planes symmetric and unremarkable. Soft tissues Unremarkable IMPRESSION: Comminuted nondisplaced proximal LEFT humeral diaphyseal fracture. Nondisplaced fracture at greater tuberosity. Tiny  nondisplaced fracture from the margin of the lesser tuberosity Electronically Signed   By: Lavonia Dana M.D.   On: 04/20/2018 19:30   Dg Chest Port 1 View  Result Date: 04/24/2018 CLINICAL DATA:  Initial evaluation for acute emesis, fever. EXAM: PORTABLE CHEST 1 VIEW COMPARISON:  Prior radiograph from 04/20/2013. FINDINGS: Patient rotated to the right. Allowing for rotation, cardiomegaly grossly stable. Mediastinal silhouette within normal limits. Lungs mildly hypoinflated. Mild right basilar atelectasis. No focal infiltrates. No edema or effusion. No pneumothorax. No acute osseous abnormality. IMPRESSION: 1. Shallow lung inflation with associated mild right basilar atelectasis. 2. No other active cardiopulmonary disease. Electronically Signed   By: Jeannine Boga M.D.   On: 04/24/2018 21:03   Dg Humerus Left  Result Date: 04/20/2018 CLINICAL DATA:  [place] today with arm and shoulder pain. EXAM: LEFT HUMERUS - 2+ VIEW COMPARISON:  None. FINDINGS: Spiral fracture of the humeral diaphysis in the proximal third without angulation, displacement or foreshortening. Humeral head subluxed slightly inferiorly, possibly positional. Chronic AC joint degenerative change. No other regional fracture. IMPRESSION: Spiral fracture of the humeral diaphysis in the proximal third without angulation, displacement or foreshortening. Electronically Signed   By: Nelson Chimes M.D.   On: 04/20/2018 17:03   Dg Foot 2 Views Right  Result Date: 04/24/2018 CLINICAL DATA:  73 y/o  F; hear pain with. Limited drainage. EXAM: RIGHT FOOT - 2 VIEW COMPARISON:  None. FINDINGS: Stable chronic findings of deformed and disorganized hindfoot with lateral displacement of calcaneus in pseudoarticulation to fibula, downward displacement of the talus, destruction of subtalar joint, and surrounding heterotopic calcification. Midfoot degenerative changes  with osteophytosis. Vascular calcifications noted. Severe osteoarthrosis of first  metatarsophalangeal joint. Bones are demineralized. No bony destructive or erosive changes to suggest osteomyelitis. IMPRESSION: 1. No findings of osteomyelitis. 2. 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 Electronically Signed   By: Kristine Garbe M.D.   On: 04/24/2018 21:25     CBC Recent Labs  Lab 04/24/18 2018 04/25/18 0550  WBC 17.3* 13.9*  HGB 10.7* 9.1*  HCT 33.6* 28.8*  PLT 191 163  MCV 90.8 91.7  MCH 28.9 29.0  MCHC 31.8 31.6  RDW 13.4 13.5    Chemistries  Recent Labs  Lab 04/24/18 2018 04/25/18 0550  NA 138 135  K 4.0 3.7  CL 111 110  CO2 20* 17*  GLUCOSE 69* 176*  BUN 29* 31*  CREATININE 1.92* 1.95*  CALCIUM 8.9 7.9*  AST 34  --   ALT 17  --   ALKPHOS 97  --   BILITOT 0.8  --    ------------------------------------------------------------------------------------------------------------------ estimated creatinine clearance is 30.7 mL/min (A) (by C-G formula based on SCr of 1.95 mg/dL (H)). ------------------------------------------------------------------------------------------------------------------ No results for input(s): HGBA1C in the last 72 hours. ------------------------------------------------------------------------------------------------------------------ No results for input(s): CHOL, HDL, LDLCALC, TRIG, CHOLHDL, LDLDIRECT in the last 72 hours. ------------------------------------------------------------------------------------------------------------------ No results for input(s): TSH, T4TOTAL, T3FREE, THYROIDAB in the last 72 hours.  Invalid input(s): FREET3 ------------------------------------------------------------------------------------------------------------------ No results for input(s): VITAMINB12, FOLATE, FERRITIN, TIBC, IRON, RETICCTPCT in the last 72 hours.  Coagulation profile No results for input(s): INR, PROTIME in the last 168 hours.  No results for input(s): DDIMER in the  last 72 hours.  Cardiac Enzymes No results for input(s): CKMB, TROPONINI, MYOGLOBIN in the last 168 hours.  Invalid input(s): CK ------------------------------------------------------------------------------------------------------------------ Invalid input(s): POCBNP    Assessment & Plan   Sepsis- likely secondary to right heel wound, although this does not appear to be overtly infected.  Meeting sepsis criteria on admission with fever to 100.8F and WBC 17.  UA and chest x-ray negative. X-ray right heel negative for osteo. -Continue empiric IV antibiotics -Follow-up blood, urine, and wound cultures -Appreciate podiatry input they do not feel that this is infection related to the foot will await cultures   Recent proximal spiral humeral shaft fracture- occurred after mechanical fall 04/20/2018.  Shoulder immobilizer in place. -Needs to follow-up with ortho (Dr. Posey Pronto) as an outpatient 04/30/2018  Hypertension-currently stable -Continue home norvasc, coreg, quinapril  Type 2 diabetes-blood sugar low in the ED. -Lantus 40 units bid starting tomorrow -SSI  CKD III- stable, creatinine close to baseline -Monitor -Avoid nephrotoxic agents  Hyperlipidemia- stable -Continue home lipitor  Hypothyroidism- stable -Continue home synthroid  Anemia of chronic kidney disease- hemoglobin at baseline -Monitor       Code Status Orders  (From admission, onward)         Start     Ordered   04/25/18 0212  Full code  Continuous     04/25/18 0211        Code Status History    This patient has a current code status but no historical code status.           Consults podiatry  DVT Prophylaxis  Lovenox  Lab Results  Component Value Date   PLT 163 04/25/2018     Time Spent in minutes   45-minute 12 PM to 12:45 PM greater than 50% of time spent in care coordination and counseling patient regarding the condition and plan of care.   Dustin Flock M.D on  04/25/2018 at 1:22 PM  Between 7am to 6pm - Pager - [phone]  After 6pm go to [email or web address] - Proofreader  [employer]   Office  [phone]

## 2018-04-25 NOTE — Progress Notes (Signed)
Ch requsted via OR for prayer with pt. Pt shared about her health challenges yet tearfully shared about how thankful she was for [employer]. Pt paraphrased Psalm 23, 91, and 41 Bible verse w/ ch. Chaplain fulfilled the request by the pt to be anointed w/ oil and prayed for. Ch anointed the room, the pt and the son that was present. The son was supportive but seemed overwhelmed. Although pt did not hv much clarity about her care plan, she is confident that her health would be restored and welcomed the ch to visit at a later time.    04/25/18 1405  Clinical Encounter Type  Visited With Patient and family together  Visit Type Spiritual support;Initial  Referral From Physician  Consult/Referral To Chaplain  Spiritual Encounters  Spiritual Needs Sacred text;Prayer;Ritual;Emotional;Grief support  Stress Factors  Patient Stress Factors Exhausted;Health changes;Major life changes  Family Stress Factors Family relationships;Major life changes

## 2018-04-25 NOTE — ED Notes (Signed)
Transport to floor room 225.AS

## 2018-04-25 NOTE — Progress Notes (Signed)
Family Meeting Note  Advance Directive:yes  Today a meeting took place with the Patient.  Patient is able to participate.  The following clinical team members were present during this meeting:MD  The following were discussed:Patient's diagnosis: sepsis, Patient's progosis: Unable to determine and Goals for treatment: Full Code  Additional follow-up to be provided: prn  Time spent during discussion:20 minutes  Evette Doffing, MD

## 2018-04-25 NOTE — Consult Note (Signed)
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 Referring Physician: Emmelyn Schmale is an 73 y.o. female.  HPI: This is a 73 year old diabetic female well-known to myself outpatient with a chronic ulceration on her right foot.  Recently admitted for sepsis and consult made for evaluation of the wound.  She has been followed closely by the wound care center.  Past Medical History:  Diagnosis Date  . Diabetes mellitus without complication ([place])   . Hypertension     History reviewed. No pertinent surgical history.  Family History  Problem Relation Age of Onset  . Breast cancer Neg Hx     Social History:  reports that she has never smoked. She has never used smokeless tobacco. She reports that she does not drink alcohol. No history on file for drug.  Allergies: No Known Allergies  Medications:  Scheduled: . amLODipine  10 mg Oral Daily  . atorvastatin  40 mg Oral Daily  . carvedilol  25 mg Oral BID  . donepezil  10 mg Oral Daily  . heparin  5,000 Units Subcutaneous Q8H  . insulin aspart  0-5 Units Subcutaneous QHS  . insulin aspart  0-9 Units Subcutaneous TID WC  . [START ON 04/26/2018] insulin glargine  40 Units Subcutaneous QHS  . levothyroxine  50 mcg Oral QAC breakfast  . [START ON 04/26/2018] pneumococcal 23 valent vaccine  0.5 mL Intramuscular Tomorrow-1000  . quinapril  40 mg Oral BID    Results for orders placed or performed during the hospital encounter of 04/24/18 (from the past 48 hour(s))  Urinalysis, Complete w Microscopic     Status: Abnormal   Collection Time: 04/24/18  8:14 PM  Result Value Ref Range   Color, Urine YELLOW (A) YELLOW   APPearance CLEAR (A) CLEAR   Specific Gravity, Urine 1.015 1.005 - 1.030   pH 5.0 5.0 - 8.0   Glucose, UA NEGATIVE NEGATIVE mg/dL   Hgb urine dipstick NEGATIVE NEGATIVE   Bilirubin Urine NEGATIVE NEGATIVE   Ketones, ur NEGATIVE NEGATIVE mg/dL   Protein, ur 30 (A) NEGATIVE mg/dL   Nitrite  NEGATIVE NEGATIVE   Leukocytes, UA NEGATIVE NEGATIVE   RBC / HPF 0-5 0 - 5 RBC/hpf   WBC, UA 0-5 0 - 5 WBC/hpf   Bacteria, UA NONE SEEN NONE SEEN   Squamous Epithelial / LPF 0-5 0 - 5   Mucus PRESENT    Hyaline Casts, UA PRESENT     Comment: Performed at [facility], [address] [place], [place] [address]  Lipase, blood     Status: None   Collection Time: 04/24/18  8:18 PM  Result Value Ref Range   Lipase 35 11 - 51 U/L    Comment: Performed at [facility], [address] [place], [place] [address]  Comprehensive metabolic panel     Status: Abnormal   Collection Time: 04/24/18  8:18 PM  Result Value Ref Range   Sodium 138 135 - 145 mmol/L   Potassium 4.0 3.5 - 5.1 mmol/L   Chloride 111 98 - 111 mmol/L   CO2 20 (L) 22 - 32 mmol/L   Glucose, Bld 69 (L) 70 - 99 mg/dL   BUN 29 (H) 8 - 23 mg/dL   Creatinine, Ser 1.92 (H) 0.44 - 1.00 mg/dL   Calcium 8.9 8.9 - 10.3 mg/dL   Total Protein 8.5 (H) 6.5 - 8.1 g/dL   Albumin 4.3 3.5 - 5.0 g/dL   AST 34 15 -  41 U/L   ALT 17 0 - 44 U/L   Alkaline Phosphatase 97 38 - 126 U/L   Total Bilirubin 0.8 0.3 - 1.2 mg/dL   GFR calc non Af Amer 26 (L) >60 mL/min   GFR calc Af Amer 30 (L) >60 mL/min   Anion gap 7 5 - 15    Comment: Performed at [facility], [address] [place], [place] [address]  CBC     Status: Abnormal   Collection Time: 04/24/18  8:18 PM  Result Value Ref Range   WBC 17.3 (H) 4.0 - 10.5 K/uL   RBC 3.70 (L) 3.87 - 5.11 MIL/uL   Hemoglobin 10.7 (L) 12.0 - 15.0 g/dL   HCT 33.6 (L) 36.0 - 46.0 %   MCV 90.8 80.0 - 100.0 fL   MCH 28.9 26.0 - 34.0 pg   MCHC 31.8 30.0 - 36.0 g/dL   RDW 13.4 11.5 - 15.5 %   Platelets 191 150 - 400 K/uL   nRBC 0.0 0.0 - 0.2 %    Comment: Performed at [facility], [address] [place], [place] [address]  Procalcitonin     Status: None   Collection Time: 04/24/18  8:18 PM  Result Value Ref Range   Procalcitonin 0.41 ng/mL    Comment:         Interpretation: PCT (Procalcitonin) <= 0.5 ng/mL: Systemic infection (sepsis) is not likely. Local bacterial infection is possible. (NOTE)       Sepsis PCT Algorithm           Lower Respiratory Tract                                      Infection PCT Algorithm    ----------------------------     ----------------------------         PCT < 0.25 ng/mL                PCT < 0.10 ng/mL         Strongly encourage             Strongly discourage   discontinuation of antibiotics    initiation of antibiotics    ----------------------------     -----------------------------       PCT 0.25 - 0.50 ng/mL            PCT 0.10 - 0.25 ng/mL               OR       >80% decrease in PCT            Discourage initiation of                                            antibiotics      Encourage discontinuation           of antibiotics    ----------------------------     -----------------------------         PCT >= 0.50 ng/mL              PCT 0.26 - 0.50 ng/mL               AND        <80% decrease in PCT  Encourage initiation of                                             antibiotics       Encourage continuation           of antibiotics    ----------------------------     -----------------------------        PCT >= 0.50 ng/mL                  PCT > 0.50 ng/mL               AND         increase in PCT                  Strongly encourage                                      initiation of antibiotics    Strongly encourage escalation           of antibiotics                                     -----------------------------                                           PCT <= 0.25 ng/mL                                                 OR                                        > 80% decrease in PCT                                     Discontinue / Do not initiate                                             antibiotics Performed at [facility], [address] [place], [place] [address]    Culture, blood (Routine x 2)     Status: None (Preliminary result)   Collection Time: 04/24/18  8:20 PM  Result Value Ref Range   Specimen Description BLOOD RIGHT ANTECUBITAL    Special Requests      BOTTLES DRAWN AEROBIC AND ANAEROBIC Blood Culture results may not be optimal due to an excessive volume of blood received in culture bottles   Culture      NO GROWTH < 12 HOURS Performed at [facility], [address], [place], [place] [address]    Report Status PENDING   Culture, blood (Routine x 2)     Status: None (Preliminary  result)   Collection Time: 04/24/18  8:20 PM  Result Value Ref Range   Specimen Description BLOOD RIGHT ARM    Special Requests      BOTTLES DRAWN AEROBIC AND ANAEROBIC Blood Culture results may not be optimal due to an excessive volume of blood received in culture bottles   Culture      NO GROWTH < 12 HOURS Performed at [facility], [address] [place], [place] [address]    Report Status PENDING   Influenza panel by PCR (type A & B)     Status: None   Collection Time: 04/24/18  8:20 PM  Result Value Ref Range   Influenza A By PCR NEGATIVE NEGATIVE   Influenza B By PCR NEGATIVE NEGATIVE    Comment: (NOTE) The Xpert Xpress Flu assay is intended as an aid in the diagnosis of  influenza and should not be used as a sole basis for treatment.  This  assay is FDA approved for nasopharyngeal swab specimens only. Nasal  washings and aspirates are unacceptable for Xpert Xpress Flu testing. Performed at [facility], [address] [place], [place] [address]   Wound or Superficial Culture     Status: None (Preliminary result)   Collection Time: 04/24/18  8:20 PM  Result Value Ref Range   Specimen Description      WOUND RIGHT FOOT Performed at [facility], [address] [place], [place] [address]    Special Requests      NONE Performed at [facility], [place], [place] [address]     Gram Stain      MODERATE WBC PRESENT, PREDOMINANTLY PMN MODERATE GRAM POSITIVE COCCI RARE GRAM VARIABLE ROD Performed at [facility], [place] [address] [place], [place] [address]    Culture PENDING    Report Status PENDING   Lactic acid, plasma     Status: None   Collection Time: 04/24/18  8:22 PM  Result Value Ref Range   Lactic Acid, Venous 0.9 0.5 - 1.9 mmol/L    Comment: Performed at [facility], [address] [place], [place] [address]  Basic metabolic panel     Status: Abnormal   Collection Time: 04/25/18  5:50 AM  Result Value Ref Range   Sodium 135 135 - 145 mmol/L   Potassium 3.7 3.5 - 5.1 mmol/L   Chloride 110 98 - 111 mmol/L   CO2 17 (L) 22 - 32 mmol/L   Glucose, Bld 176 (H) 70 - 99 mg/dL   BUN 31 (H) 8 - 23 mg/dL   Creatinine, Ser 1.95 (H) 0.44 - 1.00 mg/dL   Calcium 7.9 (L) 8.9 - 10.3 mg/dL   GFR calc non Af Amer 25 (L) >60 mL/min   GFR calc Af Amer 29 (L) >60 mL/min   Anion gap 8 5 - 15    Comment: Performed at [facility], [address] [place],  [address]  CBC     Status: Abnormal   Collection Time: 04/25/18  5:50 AM  Result Value Ref Range   WBC 13.9 (H) 4.0 - 10.5 K/uL   RBC 3.14 (L) 3.87 - 5.11 MIL/uL   Hemoglobin 9.1 (L) 12.0 - 15.0 g/dL   HCT 28.8 (L) 36.0 - 46.0 %   MCV 91.7 80.0 - 100.0 fL   MCH 29.0 26.0 - 34.0 pg   MCHC 31.6 30.0 - 36.0 g/dL   RDW 13.5 11.5 - 15.5 %  Platelets 163 150 - 400 K/uL   nRBC 0.0 0.0 - 0.2 %    Comment: Performed at [facility], [address] [place],  [address]  Glucose, capillary     Status: Abnormal   Collection Time: 04/25/18  8:07 AM  Result Value Ref Range   Glucose-Capillary 124 (H) 70 - 99 mg/dL   Comment 1 Notify RN     Dg Chest Port 1 View  Result Date: 04/24/2018 CLINICAL DATA:  Initial evaluation for acute emesis, fever. EXAM: PORTABLE CHEST 1 VIEW COMPARISON:  Prior radiograph from 04/20/2013. FINDINGS: Patient rotated to the right. Allowing  for rotation, cardiomegaly grossly stable. Mediastinal silhouette within normal limits. Lungs mildly hypoinflated. Mild right basilar atelectasis. No focal infiltrates. No edema or effusion. No pneumothorax. No acute osseous abnormality. IMPRESSION: 1. Shallow lung inflation with associated mild right basilar atelectasis. 2. No other active cardiopulmonary disease. Electronically Signed   By: Jeannine Boga M.D.   On: 04/24/2018 21:03   Dg Foot 2 Views Right  Result Date: 04/24/2018 CLINICAL DATA:  73 y/o  F; hear pain with. Limited drainage. EXAM: RIGHT FOOT - 2 VIEW COMPARISON:  None. FINDINGS: Stable chronic findings of deformed and disorganized hindfoot with lateral displacement of calcaneus in pseudoarticulation to fibula, downward displacement of the talus, destruction of subtalar joint, and surrounding heterotopic calcification. Midfoot degenerative changes with osteophytosis. Vascular calcifications noted. Severe osteoarthrosis of first metatarsophalangeal joint. Bones are demineralized. No bony destructive or erosive changes to suggest osteomyelitis. IMPRESSION: 1. No findings of osteomyelitis. 2. 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 Electronically Signed   By: Kristine Garbe M.D.   On: 04/24/2018 21:25    Review of Systems  Constitutional: Positive for chills.  HENT: Negative for congestion and hearing loss.   Eyes: Negative for blurred vision and double vision.  Respiratory: Negative for cough and shortness of breath.   Cardiovascular: Negative for chest pain and palpitations.  Gastrointestinal: Negative for nausea and vomiting.  Genitourinary: Negative for dysuria and frequency.  Musculoskeletal:       Shoulder pain from recent fall.  Skin:       Chronic wound on her right foot followed by the wound care center.  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  Endo/Heme/Allergies: Does not  bruise/bleed easily.  Psychiatric/Behavioral: Negative for depression.   Blood pressure 140/62, pulse 61, temperature 98.7 F (37.1 C), temperature source Oral, resp. rate 20, height 5\' 7"  (1.702 m), weight 91 kg, SpO2 100 %. Physical Exam  Cardiovascular:  Pedal pulses are palpable.  Musculoskeletal:     Comments: Severe chronic Charcot deformity of the right foot with lateral dislocation of the foot in relation to the lower leg.  Neurological:  Loss of protective threshold with a monofilament wire in the feet and digits.  Skin:  The skin is warm dry and somewhat atrophic.  Chronic ulceration is noted beneath the right medial rear foot area.  Pre-debridement the ulcer measures approximately 3 mm diameter with post debridement measurement approximately 9 mm diameter with a full-thickness area centrally 3 mm diameter.  Granular base upon debridement with no purulence or cellulitis or sign of infection.    Assessment/Plan: Assessment: 1.  Full-thickness chronic ulceration right rear foot. 2.  Chronic Charcot deformity right foot. 3.  Diabetes with neuropathy.  Plan: Excisional debridement of devitalized tissue from the ulceration on the right foot full-thickness including some of the deeper subcutaneous tissue  sharply using a 10 blade and tissue nippers.  Saline wet-to-dry dressing applied.  Discussed with the patient that at this point I do not think that her chronic foot ulceration is the source of any sepsis or infection.  Daily wound care until discharge.  Follow-up with myself in the wound care center as scheduled or needed.  Durward Fortes 04/25/2018, 11:26 AM

## 2018-04-25 NOTE — Progress Notes (Signed)
Pharmacy Antibiotic Note  [name] [name] is a 73 y.o. female admitted on 04/24/2018 with sepsis.  Pharmacy has been consulted for vanc/cefepime dosing.  Plan: Vancomycin 1000 mg IV Q 24 hrs. Goal AUC 400-550. Expected AUC: 584.8 SCr used: 1.92 mg/dL Css trough: 18.0 Will not order levels as therapy may be < 5 days Will continue cefepime 2g IV q12h  Ke 0.026 T1/2 ~ 24 hrs   Height: 5\' 7"  (170.2 cm) Weight: 200 lb 11.2 oz (91 kg) IBW/kg (Calculated) : 61.6  Temp (24hrs), Avg:100.3 F (37.9 C), Min:98.8 F (37.1 C), Max:102.8 F (39.3 C)  Recent Labs  Lab 04/24/18 2018 04/24/18 2022  WBC 17.3*  --   CREATININE 1.92*  --   LATICACIDVEN  --  0.9    Estimated Creatinine Clearance: 31.1 mL/min (A) (by C-G formula based on SCr of 1.92 mg/dL (H)).    No Known Allergies  Thank you for allowing pharmacy to be a part of this patient's care.  Tobie Lords, PharmD, BCPS Clinical Pharmacist 04/25/2018

## 2018-04-25 NOTE — ED Notes (Signed)
Asisted pt to reposition in bed and provided some water denies any other needs at prsent

## 2018-04-25 NOTE — ED Notes (Signed)
ED TO INPATIENT HANDOFF REPORT  Name/Age/Gender [name] [name] 73 y.o. female  Code Status   Home/SNF/Other Home  Chief Complaint Vomiting  Level of Care/Admitting Diagnosis ED Disposition    ED Disposition Condition [place]: [facility] [100120]  Level of Care: Med-Surg [16]  Diagnosis: Sepsis [facility]) [[id number]]  Admitting Physician: Hyman Bible DODD [[id number]]  Attending Physician: Hyman Bible DODD [[id number]]  Estimated length of stay: past midnight tomorrow  Certification:: I certify this patient will need inpatient services for at least 2 midnights  PT Class (Do Not Modify): Inpatient [101]  PT Acc Code (Do Not Modify): Private [1]       Medical History Past Medical History:  Diagnosis Date  . Diabetes mellitus without complication ([place])   . Hypertension     Allergies No Known Allergies  IV Location/Drains/Wounds Patient Lines/Drains/Airways Status   Active Line/Drains/Airways    Name:   Placement date:   Placement time:   Site:   Days:   Peripheral IV 04/24/18 Right Antecubital   04/24/18    2030    Antecubital   1          Labs/Imaging Results for orders placed or performed during the hospital encounter of 04/24/18 (from the past 48 hour(s))  Lipase, blood     Status: None   Collection Time: 04/24/18  8:18 PM  Result Value Ref Range   Lipase 35 11 - 51 U/L    Comment: Performed at [facility], [address] [place], [place] [address]  Comprehensive metabolic panel     Status: Abnormal   Collection Time: 04/24/18  8:18 PM  Result Value Ref Range   Sodium 138 135 - 145 mmol/L   Potassium 4.0 3.5 - 5.1 mmol/L   Chloride 111 98 - 111 mmol/L   CO2 20 (L) 22 - 32 mmol/L   Glucose, Bld 69 (L) 70 - 99 mg/dL   BUN 29 (H) 8 - 23 mg/dL   Creatinine, Ser 1.92 (H) 0.44 - 1.00 mg/dL   Calcium 8.9 8.9 - 10.3 mg/dL   Total Protein 8.5 (H) 6.5 - 8.1 g/dL   Albumin 4.3 3.5 - 5.0 g/dL   AST 34 15 - 41 U/L   ALT 17 0 - 44 U/L   Alkaline Phosphatase 97 38 - 126 U/L   Total Bilirubin 0.8 0.3 - 1.2 mg/dL   GFR calc non Af Amer 26 (L) >60 mL/min   GFR calc Af Amer 30 (L) >60 mL/min   Anion gap 7 5 - 15    Comment: Performed at [facility], [address] [place], [place] [address]  CBC     Status: Abnormal   Collection Time: 04/24/18  8:18 PM  Result Value Ref Range   WBC 17.3 (H) 4.0 - 10.5 K/uL   RBC 3.70 (L) 3.87 - 5.11 MIL/uL   Hemoglobin 10.7 (L) 12.0 - 15.0 g/dL   HCT 33.6 (L) 36.0 - 46.0 %   MCV 90.8 80.0 - 100.0 fL   MCH 28.9 26.0 - 34.0 pg   MCHC 31.8 30.0 - 36.0 g/dL   RDW 13.4 11.5 - 15.5 %   Platelets 191 150 - 400 K/uL   nRBC 0.0 0.0 - 0.2 %    Comment: Performed at [facility], [address] [place], [place] [address]  Procalcitonin     Status: None   Collection Time: 04/24/18  8:18 PM  Result Value Ref  Range   Procalcitonin 0.41 ng/mL    Comment:        Interpretation: PCT (Procalcitonin) <= 0.5 ng/mL: Systemic infection (sepsis) is not likely. Local bacterial infection is possible. (NOTE)       Sepsis PCT Algorithm           Lower Respiratory Tract                                      Infection PCT Algorithm    ----------------------------     ----------------------------         PCT < 0.25 ng/mL                PCT < 0.10 ng/mL         Strongly encourage             Strongly discourage   discontinuation of antibiotics    initiation of antibiotics    ----------------------------     -----------------------------       PCT 0.25 - 0.50 ng/mL            PCT 0.10 - 0.25 ng/mL               OR       >80% decrease in PCT            Discourage initiation of                                            antibiotics      Encourage discontinuation           of antibiotics    ----------------------------     -----------------------------         PCT >= 0.50 ng/mL              PCT 0.26 - 0.50 ng/mL               AND        <80% decrease in PCT              Encourage initiation of                                             antibiotics       Encourage continuation           of antibiotics    ----------------------------     -----------------------------        PCT >= 0.50 ng/mL                  PCT > 0.50 ng/mL               AND         increase in PCT                  Strongly encourage                                      initiation of antibiotics    Strongly encourage escalation           of antibiotics                                     -----------------------------  PCT <= 0.25 ng/mL                                                 OR                                        > 80% decrease in PCT                                     Discontinue / Do not initiate                                             antibiotics Performed at [facility], [address] [place], [place] [address]   Influenza panel by PCR (type A & B)     Status: None   Collection Time: 04/24/18  8:20 PM  Result Value Ref Range   Influenza A By PCR NEGATIVE NEGATIVE   Influenza B By PCR NEGATIVE NEGATIVE    Comment: (NOTE) The Xpert Xpress Flu assay is intended as an aid in the diagnosis of  influenza and should not be used as a sole basis for treatment.  This  assay is FDA approved for nasopharyngeal swab specimens only. Nasal  washings and aspirates are unacceptable for Xpert Xpress Flu testing. Performed at [facility], [address] [place], [place] [address]   Lactic acid, plasma     Status: None   Collection Time: 04/24/18  8:22 PM  Result Value Ref Range   Lactic Acid, Venous 0.9 0.5 - 1.9 mmol/L    Comment: Performed at [facility], [address], [place], [place] [address]   Dg Chest Port 1 View  Result Date: 04/24/2018 CLINICAL DATA:  Initial evaluation for acute emesis, fever. EXAM: PORTABLE CHEST 1 VIEW COMPARISON:  Prior radiograph from 04/20/2013. FINDINGS:  Patient rotated to the right. Allowing for rotation, cardiomegaly grossly stable. Mediastinal silhouette within normal limits. Lungs mildly hypoinflated. Mild right basilar atelectasis. No focal infiltrates. No edema or effusion. No pneumothorax. No acute osseous abnormality. IMPRESSION: 1. Shallow lung inflation with associated mild right basilar atelectasis. 2. No other active cardiopulmonary disease. Electronically Signed   By: Jeannine Boga M.D.   On: 04/24/2018 21:03   Dg Foot 2 Views Right  Result Date: 04/24/2018 CLINICAL DATA:  73 y/o  F; hear pain with. Limited drainage. EXAM: RIGHT FOOT - 2 VIEW COMPARISON:  None. FINDINGS: Stable chronic findings of deformed and disorganized hindfoot with lateral displacement of calcaneus in pseudoarticulation to fibula, downward displacement of the talus, destruction of subtalar joint, and surrounding heterotopic calcification. Midfoot degenerative changes with osteophytosis. Vascular calcifications noted. Severe osteoarthrosis of first metatarsophalangeal joint. Bones are demineralized. No bony destructive or erosive changes to suggest osteomyelitis. IMPRESSION: 1. No findings of osteomyelitis. 2. 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 Electronically Signed   By: Kristine Garbe M.D.   On: 04/24/2018 21:25    Pending Labs Unresulted Labs (From admission, onward)    Start     Ordered   04/25/18 0121  Urine Culture  Once,   STAT     04/25/18 0120   04/24/18 2026  Wound or Superficial Culture  Once,   STAT     04/24/18 2025   04/24/18 2015  Culture, blood (Routine x 2)  BLOOD CULTURE X 2,   STAT     04/24/18 2014   04/24/18 2014  Urinalysis, Complete w Microscopic  ONCE - STAT,   STAT     04/24/18 2014   Signed and Held  CBC  (heparin)  Once,   R    Comments:  Baseline for heparin therapy IF NOT ALREADY DRAWN.  Notify MD if PLT < 100 K.    Signed and Held   Signed and Held  Creatinine, serum   (heparin)  Once,   R    Comments:  Baseline for heparin therapy IF NOT ALREADY DRAWN.    Signed and Held   Signed and Held  Basic metabolic panel  Tomorrow morning,   R     Signed and Held   Signed and Held  CBC  Tomorrow morning,   R     Signed and Held   Signed and Held  Procalcitonin  Daily,   R     Signed and Held          Vitals/Pain Today's Vitals   04/25/18 0000 04/25/18 0030 04/25/18 0051 04/25/18 0051  BP: (!) 153/85 (!) 159/75  (!) 142/80  Pulse: 65 64  68  Resp: (!) 23 11  19   Temp:    98.9 F (37.2 C)  TempSrc:    Oral  SpO2: 97% 100%  98%  Weight:      PainSc:   0-No pain 0-No pain    Isolation Precautions Droplet precaution  Medications Medications  metroNIDAZOLE (FLAGYL) IVPB 500 mg (0 mg Intravenous Stopped 04/24/18 2325)  ceFEPIme (MAXIPIME) 2 g in sodium chloride 0.9 % 100 mL IVPB (0 g Intravenous Stopped 04/24/18 2116)  vancomycin (VANCOCIN) IVPB 1000 mg/200 mL premix (0 mg Intravenous Stopped 04/24/18 2216)  acetaminophen (TYLENOL) tablet 1,000 mg (1,000 mg Oral Given 04/24/18 2040)  sodium chloride 0.9 % bolus 1,000 mL (0 mLs Intravenous Stopped 04/24/18 2209)    Mobility walks with device

## 2018-04-26 DIAGNOSIS — A419 Sepsis, unspecified organism: Secondary | ICD-10-CM | POA: Diagnosis not present

## 2018-04-26 DIAGNOSIS — E860 Dehydration: Secondary | ICD-10-CM | POA: Diagnosis not present

## 2018-04-26 LAB — BASIC METABOLIC PANEL
Anion gap: 7 (ref 5–15)
BUN: 38 mg/dL — ABNORMAL HIGH (ref 8–23)
CO2: 18 mmol/L — ABNORMAL LOW (ref 22–32)
Calcium: 8 mg/dL — ABNORMAL LOW (ref 8.9–10.3)
Chloride: 111 mmol/L (ref 98–111)
Creatinine, Ser: 2.09 mg/dL — ABNORMAL HIGH (ref 0.44–1.00)
GFR calc Af Amer: 27 mL/min — ABNORMAL LOW (ref >60)
GFR calc non Af Amer: 23 mL/min — ABNORMAL LOW (ref >60)
Glucose, Bld: 156 mg/dL — ABNORMAL HIGH (ref 70–99)
Potassium: 4.1 mmol/L (ref 3.5–5.1)
Sodium: 136 mmol/L (ref 135–145)

## 2018-04-26 LAB — GLUCOSE, CAPILLARY
Glucose-Capillary: 158 mg/dL — ABNORMAL HIGH (ref 70–99)
Glucose-Capillary: 137 mg/dL — ABNORMAL HIGH (ref 70–99)
Glucose-Capillary: 104 mg/dL — ABNORMAL HIGH (ref 70–99)
Glucose-Capillary: 141 mg/dL — ABNORMAL HIGH (ref 70–99)

## 2018-04-26 LAB — URINE CULTURE: Culture: NO GROWTH

## 2018-04-26 LAB — CBC
HCT: 27.4 % — ABNORMAL LOW (ref 36.0–46.0)
Hemoglobin: 8.7 g/dL — ABNORMAL LOW (ref 12.0–15.0)
MCH: 29.3 pg (ref 26.0–34.0)
MCHC: 31.8 g/dL (ref 30.0–36.0)
MCV: 92.3 fL (ref 80.0–100.0)
Platelets: 159 10*3/uL (ref 150–400)
RBC: 2.97 MIL/uL — ABNORMAL LOW (ref 3.87–5.11)
RDW: 13.2 % (ref 11.5–15.5)
WBC: 10.8 10*3/uL — ABNORMAL HIGH (ref 4.0–10.5)
nRBC: 0 % (ref 0.0–0.2)

## 2018-04-26 LAB — PROCALCITONIN: Procalcitonin: 0.66 ng/mL

## 2018-04-26 MED ORDER — AMOXICILLIN-POT CLAVULANATE 875-125 MG PO TABS
1.0000 | ORAL_TABLET | Freq: Two times a day (BID) | ORAL | Status: DC
  Administered 2018-04-26 – 2018-04-27 (×3): 1 via ORAL
  Filled 2018-04-26 (×3): qty 1

## 2018-04-26 MED ORDER — SODIUM CHLORIDE 0.9 % IV SOLN
1.0000 g | Freq: Two times a day (BID) | INTRAVENOUS | Status: DC
  Filled 2018-04-26: qty 1

## 2018-04-26 NOTE — Progress Notes (Signed)
[place] at [facility]                                                                                                                                                                                  Patient Demographics   [name] [name], is a 73 y.o. female, DOB - 08-19-1945, TWS:[id number]  Admit date - 04/24/2018   Admitting Physician Sela Hua, MD  Outpatient Primary MD for the patient is Marguerita Merles, MD   LOS - 1  Subjective: Patient states that she was doing well up until this morning when she became nauseous.  She is not having any fevers or chills  Review of Systems:   CONSTITUTIONAL: No documented positive fever.  Positive fatigue, positive weakness. No weight gain, no weight loss.  EYES: No blurry or double vision.  ENT: No tinnitus. No postnasal drip. No redness of the oropharynx.  RESPIRATORY: No cough, no wheeze, no hemoptysis. No dyspnea.  CARDIOVASCULAR: No chest pain. No orthopnea. No palpitations. No syncope.  GASTROINTESTINAL: No nausea, no vomiting or diarrhea. No abdominal pain. No melena or hematochezia.  GENITOURINARY: No dysuria or hematuria.  ENDOCRINE: No polyuria or nocturia. No heat or cold intolerance.  HEMATOLOGY: No anemia. No bruising. No bleeding.  INTEGUMENTARY: No rashes. No lesions.  MUSCULOSKELETAL: No arthritis. No swelling. No gout.  NEUROLOGIC: No numbness, tingling, or ataxia. No seizure-type activity.  PSYCHIATRIC: No anxiety. No insomnia. No ADD.    Vitals:   Vitals:   04/25/18 2136 04/26/18 0526 04/26/18 0818 04/26/18 1127  BP: 138/76 (!) 138/52 (!) 143/62 (!) 111/49  Pulse: 63 62 66 (!) 55  Resp: 20 20  16   Temp: 99.2 F (37.3 C) 98.7 F (37.1 C)  98 F (36.7 C)  TempSrc: Oral Oral  Oral  SpO2: 98% 98%  97%  Weight:      Height:        Wt Readings from Last 3 Encounters:  04/25/18 91 kg  04/20/18 90.7 kg  01/03/18 88.9 kg     Intake/Output Summary (Last 24 hours) at 04/26/2018  1209 Last data filed at 04/26/2018 1053 Gross per 24 hour  Intake 951.37 ml  Output 500 ml  Net 451.37 ml    Physical Exam:   GENERAL: Pleasant-appearing in no apparent distress.  HEAD, EYES, EARS, NOSE AND THROAT: Atraumatic, normocephalic. Extraocular muscles are intact. Pupils equal and reactive to light. Sclerae anicteric. No conjunctival injection. No oro-pharyngeal erythema.  NECK: Supple. There is no jugular venous distention. No bruits, no lymphadenopathy, no thyromegaly.  HEART: Regular rate and rhythm,. No murmurs, no rubs, no clicks.  LUNGS: Clear to auscultation bilaterally. No rales or  rhonchi. No wheezes.  ABDOMEN: Soft, flat, nontender, nondistended. Has good bowel sounds. No hepatosplenomegaly appreciated.  EXTREMITIES: Left forearm with brace in place nEUROLOGIC: The patient is alert, awake, and oriented x3 with no focal motor or sensory deficits appreciated bilaterally.  SKIN: Right heel lesion with ulceration.  Psych: Not anxious, depressed LN: No inguinal LN enlargement    Antibiotics   Anti-infectives (From admission, onward)   Start     Dose/Rate Route Frequency Ordered Stop   04/26/18 2200  ceFEPIme (MAXIPIME) 1 g in sodium chloride 0.9 % 100 mL IVPB  Status:  Discontinued     1 g 200 mL/hr over 30 Minutes Intravenous Every 12 hours 04/26/18 0956 04/26/18 1027   04/26/18 1200  amoxicillin-clavulanate (AUGMENTIN) 875-125 MG per tablet 1 tablet     1 tablet Oral Every 12 hours 04/26/18 1027     04/25/18 0900  ceFEPIme (MAXIPIME) 2 g in sodium chloride 0.9 % 100 mL IVPB  Status:  Discontinued     2 g 200 mL/hr over 30 Minutes Intravenous Every 12 hours 04/25/18 0507 04/26/18 0956   04/25/18 0515  vancomycin (VANCOCIN) 1,500 mg in sodium chloride 0.9 % 500 mL IVPB  Status:  Discontinued     1,500 mg 250 mL/hr over 120 Minutes Intravenous Every 24 hours 04/25/18 0507 04/25/18 0512   04/25/18 0515  vancomycin (VANCOCIN) 1,250 mg in sodium chloride 0.9 % 250 mL  IVPB  Status:  Discontinued     1,250 mg 166.7 mL/hr over 90 Minutes Intravenous Every 24 hours 04/25/18 0512 04/25/18 0514   04/25/18 0515  vancomycin (VANCOCIN) IVPB 1000 mg/200 mL premix  Status:  Discontinued     1,000 mg 200 mL/hr over 60 Minutes Intravenous Every 36 hours 04/25/18 0514 04/25/18 0514   04/25/18 0515  vancomycin (VANCOCIN) IVPB 1000 mg/200 mL premix  Status:  Discontinued     1,000 mg 200 mL/hr over 60 Minutes Intravenous Every 24 hours 04/25/18 0514 04/26/18 1027   04/24/18 2030  ceFEPIme (MAXIPIME) 2 g in sodium chloride 0.9 % 100 mL IVPB     2 g 200 mL/hr over 30 Minutes Intravenous  Once 04/24/18 2027 04/24/18 2116   04/24/18 2030  metroNIDAZOLE (FLAGYL) IVPB 500 mg  Status:  Discontinued     500 mg 100 mL/hr over 60 Minutes Intravenous Every 8 hours 04/24/18 2027 04/25/18 0211   04/24/18 2030  vancomycin (VANCOCIN) IVPB 1000 mg/200 mL premix     1,000 mg 200 mL/hr over 60 Minutes Intravenous  Once 04/24/18 2027 04/24/18 2216      Medications   Scheduled Meds: . amLODipine  10 mg Oral Daily  . amoxicillin-clavulanate  1 tablet Oral Q12H  . atorvastatin  40 mg Oral Daily  . carvedilol  25 mg Oral BID  . donepezil  10 mg Oral Daily  . heparin  5,000 Units Subcutaneous Q8H  . insulin aspart  0-5 Units Subcutaneous QHS  . insulin aspart  0-9 Units Subcutaneous TID WC  . insulin glargine  40 Units Subcutaneous QHS  . levothyroxine  50 mcg Oral QAC breakfast  . quinapril  40 mg Oral BID   Continuous Infusions: . sodium chloride Stopped (04/26/18 1028)   PRN Meds:.sodium chloride, acetaminophen **OR** acetaminophen, diphenhydrAMINE, ondansetron **OR** ondansetron (ZOFRAN) IV, polyethylene glycol   Data Review:   Micro Results Recent Results (from the past 240 hour(s))  Culture, blood (Routine x 2)     Status: None (Preliminary result)   Collection Time: 04/24/18  8:20 PM  Result Value Ref Range Status   Specimen Description BLOOD RIGHT ANTECUBITAL   Final   Special Requests   Final    BOTTLES DRAWN AEROBIC AND ANAEROBIC Blood Culture results may not be optimal due to an excessive volume of blood received in culture bottles   Culture   Final    NO GROWTH 2 DAYS Performed at [facility], [address], [place], [place] [address]    Report Status PENDING  Incomplete  Culture, blood (Routine x 2)     Status: None (Preliminary result)   Collection Time: 04/24/18  8:20 PM  Result Value Ref Range Status   Specimen Description BLOOD RIGHT ARM  Final   Special Requests   Final    BOTTLES DRAWN AEROBIC AND ANAEROBIC Blood Culture results may not be optimal due to an excessive volume of blood received in culture bottles   Culture   Final    NO GROWTH 2 DAYS Performed at [facility], [address] [place], [place] [address]    Report Status PENDING  Incomplete  Wound or Superficial Culture     Status: None (Preliminary result)   Collection Time: 04/24/18  8:20 PM  Result Value Ref Range Status   Specimen Description   Final    WOUND RIGHT FOOT Performed at [facility], [address] [place], [place] [address]    Special Requests   Final    NONE Performed at [facility], [address] [place], [place] [address]    Gram Stain   Final    MODERATE WBC PRESENT, PREDOMINANTLY PMN MODERATE GRAM POSITIVE COCCI RARE GRAM VARIABLE ROD    Culture   Final    TOO YOUNG TO READ Performed at [facility], [place] [address] [place], [place] [address]    Report Status PENDING  Incomplete  Urine Culture     Status: None   Collection Time: 04/25/18  1:21 AM  Result Value Ref Range Status   Specimen Description   Final    URINE, RANDOM Performed at [facility], [address], [place], [place] [address]    Special Requests   Final    NONE Performed at [facility], [address] [name], [place] [address]    Culture   Final    NO GROWTH Performed at [facility], [place] [address], [place], [place] [address]    Report Status 04/26/2018 FINAL  Final    Radiology Reports Ct Head Wo Contrast  Result Date: 04/20/2018 CLINICAL DATA:  Pain after fall. EXAM: CT HEAD WITHOUT CONTRAST CT CERVICAL SPINE WITHOUT CONTRAST TECHNIQUE: Multidetector CT imaging of the head and cervical spine was performed following the standard protocol without intravenous contrast. Multiplanar CT image reconstructions of the cervical spine were also generated. COMPARISON:  CT of the brain May 28, 2017 FINDINGS: CT HEAD FINDINGS Brain: No subdural, epidural, or subarachnoid hemorrhage. Ventricles and sulci are stable. Cerebellum, brainstem, and basal cisterns are normal. No acute cortical ischemia or infarct identified. No mass effect or midline shift. Chronic white matter changes. Vascular: Calcified atherosclerosis in the intracranial carotids. Skull: Normal. Negative for fracture or focal lesion. Sinuses/Orbits: No acute finding. Other: Soft tissue swelling in the right forehead and supraorbital region. The globes are intact within visualize limits. Extracranial soft tissues otherwise normal. CT CERVICAL SPINE FINDINGS Alignment: Normal. Skull base and vertebrae: No acute fracture. No primary bone lesion or focal pathologic process. Soft  tissues and spinal canal: No prevertebral fluid or swelling. No visible canal hematoma. Disc levels:  Multilevel degenerative changes. Upper chest: Negative. Other: No other abnormalities identified. IMPRESSION: 1. No acute intracranial abnormalities. Chronic white matter changes. 2. Soft tissue swelling over the right forehead and supraorbital region. 3. No fracture or traumatic malalignment in the cervical spine. Mild multilevel degenerative changes. Electronically Signed   By: Dorise Bullion III M.D   On: 04/20/2018 19:27   Ct Cervical Spine Wo Contrast  Result Date: 04/20/2018 CLINICAL DATA:  Pain after fall. EXAM: CT HEAD WITHOUT CONTRAST CT  CERVICAL SPINE WITHOUT CONTRAST TECHNIQUE: Multidetector CT imaging of the head and cervical spine was performed following the standard protocol without intravenous contrast. Multiplanar CT image reconstructions of the cervical spine were also generated. COMPARISON:  CT of the brain May 28, 2017 FINDINGS: CT HEAD FINDINGS Brain: No subdural, epidural, or subarachnoid hemorrhage. Ventricles and sulci are stable. Cerebellum, brainstem, and basal cisterns are normal. No acute cortical ischemia or infarct identified. No mass effect or midline shift. Chronic white matter changes. Vascular: Calcified atherosclerosis in the intracranial carotids. Skull: Normal. Negative for fracture or focal lesion. Sinuses/Orbits: No acute finding. Other: Soft tissue swelling in the right forehead and supraorbital region. The globes are intact within visualize limits. Extracranial soft tissues otherwise normal. CT CERVICAL SPINE FINDINGS Alignment: Normal. Skull base and vertebrae: No acute fracture. No primary bone lesion or focal pathologic process. Soft tissues and spinal canal: No prevertebral fluid or swelling. No visible canal hematoma. Disc levels:  Multilevel degenerative changes. Upper chest: Negative. Other: No other abnormalities identified. IMPRESSION: 1. No acute intracranial abnormalities. Chronic white matter changes. 2. Soft tissue swelling over the right forehead and supraorbital region. 3. No fracture or traumatic malalignment in the cervical spine. Mild multilevel degenerative changes. Electronically Signed   By: Dorise Bullion III M.D   On: 04/20/2018 19:27   Ct Shoulder Left Wo Contrast  Result Date: 04/20/2018 CLINICAL DATA:  Tripped and fell today, LEFT shoulder fracture EXAM: CT OF THE UPPER LEFT EXTREMITY WITHOUT CONTRAST TECHNIQUE: Multidetector CT imaging of the upper left extremity was performed according to the standard protocol. Sagittal and coronal MPR images reconstructed from axial data set.  COMPARISON:  None. FINDINGS: Bones/Joint/Cartilage Osseous demineralization. AC joint alignment normal with degenerative changes noted. Visualized ribs intact. Scapula intact. Comminuted nondisplaced fracture of the proximal to mid LEFT humeral diaphysis. Tiny avulsion fracture is seen from the margin of the lesser tuberosity. Additionally, nondisplaced fracture plane identified at the greater tuberosity. No dislocation. No destructive bone lesions. No significant joint effusion. Ligaments Suboptimally assessed by CT. Muscles and Tendons Muscular planes symmetric and unremarkable. Soft tissues Unremarkable IMPRESSION: Comminuted nondisplaced proximal LEFT humeral diaphyseal fracture. Nondisplaced fracture at greater tuberosity. Tiny nondisplaced fracture from the margin of the lesser tuberosity Electronically Signed   By: Lavonia Dana M.D.   On: 04/20/2018 19:30   Dg Chest Port 1 View  Result Date: 04/24/2018 CLINICAL DATA:  Initial evaluation for acute emesis, fever. EXAM: PORTABLE CHEST 1 VIEW COMPARISON:  Prior radiograph from 04/20/2013. FINDINGS: Patient rotated to the right. Allowing for rotation, cardiomegaly grossly stable. Mediastinal silhouette within normal limits. Lungs mildly hypoinflated. Mild right basilar atelectasis. No focal infiltrates. No edema or effusion. No pneumothorax. No acute osseous abnormality. IMPRESSION: 1. Shallow lung inflation with associated mild right basilar atelectasis. 2. No other active cardiopulmonary disease. Electronically Signed   By: Jeannine Boga M.D.   On: 04/24/2018 21:03   Dg Humerus  Left  Result Date: 04/20/2018 CLINICAL DATA:  [place] today with arm and shoulder pain. EXAM: LEFT HUMERUS - 2+ VIEW COMPARISON:  None. FINDINGS: Spiral fracture of the humeral diaphysis in the proximal third without angulation, displacement or foreshortening. Humeral head subluxed slightly inferiorly, possibly positional. Chronic AC joint degenerative change. No other regional  fracture. IMPRESSION: Spiral fracture of the humeral diaphysis in the proximal third without angulation, displacement or foreshortening. Electronically Signed   By: Nelson Chimes M.D.   On: 04/20/2018 17:03   Dg Foot 2 Views Right  Result Date: 04/24/2018 CLINICAL DATA:  73 y/o  F; hear pain with. Limited drainage. EXAM: RIGHT FOOT - 2 VIEW COMPARISON:  None. FINDINGS: Stable chronic findings of deformed and disorganized hindfoot with lateral displacement of calcaneus in pseudoarticulation to fibula, downward displacement of the talus, destruction of subtalar joint, and surrounding heterotopic calcification. Midfoot degenerative changes with osteophytosis. Vascular calcifications noted. Severe osteoarthrosis of first metatarsophalangeal joint. Bones are demineralized. No bony destructive or erosive changes to suggest osteomyelitis. IMPRESSION: 1. No findings of osteomyelitis. 2. 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 Electronically Signed   By: Kristine Garbe M.D.   On: 04/24/2018 21:25     CBC Recent Labs  Lab 04/24/18 2018 04/25/18 0550 04/26/18 0327  WBC 17.3* 13.9* 10.8*  HGB 10.7* 9.1* 8.7*  HCT 33.6* 28.8* 27.4*  PLT 191 163 159  MCV 90.8 91.7 92.3  MCH 28.9 29.0 29.3  MCHC 31.8 31.6 31.8  RDW 13.4 13.5 13.2    Chemistries  Recent Labs  Lab 04/24/18 2018 04/25/18 0550 04/26/18 0327  NA 138 135 136  K 4.0 3.7 4.1  CL 111 110 111  CO2 20* 17* 18*  GLUCOSE 69* 176* 156*  BUN 29* 31* 38*  CREATININE 1.92* 1.95* 2.09*  CALCIUM 8.9 7.9* 8.0*  AST 34  --   --   ALT 17  --   --   ALKPHOS 97  --   --   BILITOT 0.8  --   --    ------------------------------------------------------------------------------------------------------------------ estimated creatinine clearance is 28.6 mL/min (A) (by C-G formula based on SCr of 2.09 mg/dL  (H)). ------------------------------------------------------------------------------------------------------------------ No results for input(s): HGBA1C in the last 72 hours. ------------------------------------------------------------------------------------------------------------------ No results for input(s): CHOL, HDL, LDLCALC, TRIG, CHOLHDL, LDLDIRECT in the last 72 hours. ------------------------------------------------------------------------------------------------------------------ No results for input(s): TSH, T4TOTAL, T3FREE, THYROIDAB in the last 72 hours.  Invalid input(s): FREET3 ------------------------------------------------------------------------------------------------------------------ No results for input(s): VITAMINB12, FOLATE, FERRITIN, TIBC, IRON, RETICCTPCT in the last 72 hours.  Coagulation profile No results for input(s): INR, PROTIME in the last 168 hours.  No results for input(s): DDIMER in the last 72 hours.  Cardiac Enzymes No results for input(s): CKMB, TROPONINI, MYOGLOBIN in the last 168 hours.  Invalid input(s): CK ------------------------------------------------------------------------------------------------------------------ Invalid input(s): [place]   Sepsis-initially was thought to be her lower extremity wound however was seen by podiatry did seem to think this is more related to her Charcot joint.   Patient's WBC count is trending down.  She is feeling very nauseous I will discontinue IV antibiotics will place her on oral antibiotics follow cultures   Recent proximal spiral humeral shaft fracture- occurred after mechanical fall 04/20/2018.  Shoulder immobilizer in place. -Needs to follow-up with ortho (Dr. Posey Pronto) as an outpatient 04/30/2018  Hypertension-currently stable -Continue home norvasc, coreg, quinapril  Type 2 diabetes-blood sugar low in the ED. -Continue Lantus -SSI  CKD III- stable, creatinine close  to  baseline -Monitor -Avoid nephrotoxic agents  Hyperlipidemia- stable -Continue home lipitor  Hypothyroidism- stable -Continue home synthroid  Anemia of chronic kidney disease- hemoglobin at baseline -Monitor       Code Status Orders  (From admission, onward)         Start     Ordered   04/25/18 0212  Full code  Continuous     04/25/18 0211        Code Status History    This patient has a current code status but no historical code status.           Consults podiatry  DVT Prophylaxis  Lovenox  Lab Results  Component Value Date   PLT 159 04/26/2018     Time Spent in minutes   45-minute 12 PM to 12:45 PM greater than 50% of time spent in care coordination and counseling patient regarding the condition and plan of care.   Dustin Flock M.D on 04/26/2018 at 12:09 PM  Between 7am to 6pm - Pager - [phone]  After 6pm go to [email or web address] - Proofreader  [employer]   Office  [phone]

## 2018-04-26 NOTE — Progress Notes (Signed)
Pharmacy Antibiotic Note  [name] [name] is a 73 y.o. female admitted on 04/24/2018 with sepsis.  Pharmacy has been consulted for vanc/cefepime dosing.  Plan: Vancomycin 1000 mg IV Q 24 hrs. Goal AUC 400-550. Expected AUC: 584.8 SCr used: 1.92 mg/dL Css trough: 18.0 Will not order levels as therapy may be < 5 days Will continue cefepime 2g IV q12h Ke 0.026 T1/2 ~ 24 hrs  1/19-  Scr increase.  Crcl 28.6 ml/min. Will adjust Cefepime to 1 gram IV q12h.    Height: 5\' 7"  (170.2 cm) Weight: 200 lb 11.2 oz (91 kg) IBW/kg (Calculated) : 61.6  Temp (24hrs), Avg:99.2 F (37.3 C), Min:98.7 F (37.1 C), Max:99.8 F (37.7 C)  Recent Labs  Lab 04/24/18 2018 04/24/18 2022 04/25/18 0550 04/26/18 0327  WBC 17.3*  --  13.9* 10.8*  CREATININE 1.92*  --  1.95* 2.09*  LATICACIDVEN  --  0.9  --   --     Estimated Creatinine Clearance: 28.6 mL/min (A) (by C-G formula based on SCr of 2.09 mg/dL (H)).    No Known Allergies  Thank you for allowing pharmacy to be a part of this patient's care.  Chinita Greenland PharmD Clinical Pharmacist 04/26/2018

## 2018-04-27 DIAGNOSIS — E860 Dehydration: Secondary | ICD-10-CM | POA: Diagnosis not present

## 2018-04-27 DIAGNOSIS — Y92230 Patient room in hospital as the place of occurrence of the external cause: Secondary | ICD-10-CM | POA: Diagnosis not present

## 2018-04-27 DIAGNOSIS — W19XXXD Unspecified fall, subsequent encounter: Secondary | ICD-10-CM | POA: Diagnosis not present

## 2018-04-27 DIAGNOSIS — A419 Sepsis, unspecified organism: Secondary | ICD-10-CM | POA: Diagnosis not present

## 2018-04-27 DIAGNOSIS — Z23 Encounter for immunization: Secondary | ICD-10-CM | POA: Diagnosis present

## 2018-04-27 LAB — BASIC METABOLIC PANEL
Anion gap: 8 (ref 5–15)
BUN: 43 mg/dL — ABNORMAL HIGH (ref 8–23)
CO2: 18 mmol/L — ABNORMAL LOW (ref 22–32)
Calcium: 8.3 mg/dL — ABNORMAL LOW (ref 8.9–10.3)
Chloride: 109 mmol/L (ref 98–111)
Creatinine, Ser: 2.23 mg/dL — ABNORMAL HIGH (ref 0.44–1.00)
GFR calc Af Amer: 25 mL/min — ABNORMAL LOW (ref >60)
GFR calc non Af Amer: 21 mL/min — ABNORMAL LOW (ref >60)
Glucose, Bld: 118 mg/dL — ABNORMAL HIGH (ref 70–99)
Potassium: 4.5 mmol/L (ref 3.5–5.1)
Sodium: 135 mmol/L (ref 135–145)

## 2018-04-27 LAB — GLUCOSE, CAPILLARY
Glucose-Capillary: 94 mg/dL (ref 70–99)
Glucose-Capillary: 221 mg/dL — ABNORMAL HIGH (ref 70–99)

## 2018-04-27 LAB — CBC WITH DIFFERENTIAL/PLATELET
Abs Immature Granulocytes: 0.03 10*3/uL (ref 0.00–0.07)
Basophils Absolute: 0 10*3/uL (ref 0.0–0.1)
Basophils Relative: 0 %
Eosinophils Absolute: 0.1 10*3/uL (ref 0.0–0.5)
Eosinophils Relative: 1 %
HCT: 26.9 % — ABNORMAL LOW (ref 36.0–46.0)
Hemoglobin: 8.7 g/dL — ABNORMAL LOW (ref 12.0–15.0)
Immature Granulocytes: 0 %
Lymphocytes Relative: 17 %
Lymphs Abs: 1.7 10*3/uL (ref 0.7–4.0)
MCH: 29.5 pg (ref 26.0–34.0)
MCHC: 32.3 g/dL (ref 30.0–36.0)
MCV: 91.2 fL (ref 80.0–100.0)
Monocytes Absolute: 1.3 10*3/uL — ABNORMAL HIGH (ref 0.1–1.0)
Monocytes Relative: 13 %
Neutro Abs: 6.7 10*3/uL (ref 1.7–7.7)
Neutrophils Relative %: 69 %
Platelets: 171 10*3/uL (ref 150–400)
RBC: 2.95 MIL/uL — ABNORMAL LOW (ref 3.87–5.11)
RDW: 13.2 % (ref 11.5–15.5)
WBC: 9.8 10*3/uL (ref 4.0–10.5)
nRBC: 0 % (ref 0.0–0.2)

## 2018-04-27 LAB — AEROBIC CULTURE W GRAM STAIN (SUPERFICIAL SPECIMEN)

## 2018-04-27 MED ORDER — AMOXICILLIN-POT CLAVULANATE 875-125 MG PO TABS: 1.0000 | ORAL_TABLET | Freq: Two times a day (BID) | ORAL | 0 refills | Status: AC

## 2018-04-27 MED ORDER — TRAMADOL HCL 50 MG PO TABS
50.0000 mg | ORAL_TABLET | Freq: Once | ORAL | Status: AC
  Administered 2018-04-27: 50 mg via ORAL
  Filled 2018-04-27: qty 1

## 2018-04-27 MED ORDER — TRAMADOL HCL 50 MG PO TABS: 50.0000 mg | ORAL_TABLET | Freq: Four times a day (QID) | ORAL | 0 refills | Status: AC | PRN

## 2018-04-27 NOTE — Progress Notes (Signed)
Pt discharged per MD order. IV removed. Discharge instructions reviewed with pt. All questions answered to pt satisfaction. Prescriptions given to pt. Pt taken to car in wheelchair by staff.

## 2018-04-27 NOTE — Discharge Summary (Signed)
[employer] [facility] [place] [facility], 73 y.o., DOB 10-04-1945, MRN [id number]. Admission date: 04/24/2018 Discharge Date 04/27/2018 Primary MD Marguerita Merles, MD Admitting Physician Sela Hua, MD  Admission Diagnosis  Dehydration [E86.0] Nausea vomiting and diarrhea [R11.2, R19.7] Sepsis, due to unspecified organism, unspecified whether acute organ dysfunction present [facility]) [A41.9]  Discharge Diagnosis   Active Problems: Sepsis initially felt to be lower extremity cellulitis however seen by podiatry this been ruled out Recent proximal spiral humeral shaft fracture Hypertension Diabetes type 2 Chronic kidney disease stage III Hyperlipidemia Hypothyroidism  Hospital Course  [name] [name]  is a 74 y.o. female with a known history of hypertension and type 2 diabetes who presented to the ED with chills, nausea, and "grogginess" that started day of admission.  Patient was seen in the emergency room was also noticed to have a fever initially was thought to have an infection in her lower extremity.  She was seen by podiatry who felt that this was changes related to Charcot joint.  Patient no longer has any fevers.  She is stable to be discharged on oral antibiotics.           Consults  podiatry  Significant Tests:  See full reports for all details     Ct Head Wo Contrast  Result Date: 04/20/2018 CLINICAL DATA:  Pain after fall. EXAM: CT HEAD WITHOUT CONTRAST CT CERVICAL SPINE WITHOUT CONTRAST TECHNIQUE: Multidetector CT imaging of the head and cervical spine was performed following the standard protocol without intravenous contrast. Multiplanar CT image reconstructions of the cervical spine were also generated. COMPARISON:  CT of the brain May 28, 2017 FINDINGS: CT HEAD FINDINGS Brain: No subdural, epidural, or subarachnoid hemorrhage. Ventricles and sulci are stable. Cerebellum, brainstem, and basal cisterns are normal. No acute cortical ischemia or  infarct identified. No mass effect or midline shift. Chronic white matter changes. Vascular: Calcified atherosclerosis in the intracranial carotids. Skull: Normal. Negative for fracture or focal lesion. Sinuses/Orbits: No acute finding. Other: Soft tissue swelling in the right forehead and supraorbital region. The globes are intact within visualize limits. Extracranial soft tissues otherwise normal. CT CERVICAL SPINE FINDINGS Alignment: Normal. Skull base and vertebrae: No acute fracture. No primary bone lesion or focal pathologic process. Soft tissues and spinal canal: No prevertebral fluid or swelling. No visible canal hematoma. Disc levels:  Multilevel degenerative changes. Upper chest: Negative. Other: No other abnormalities identified. IMPRESSION: 1. No acute intracranial abnormalities. Chronic white matter changes. 2. Soft tissue swelling over the right forehead and supraorbital region. 3. No fracture or traumatic malalignment in the cervical spine. Mild multilevel degenerative changes. Electronically Signed   By: Dorise Bullion III M.D   On: 04/20/2018 19:27   Ct Cervical Spine Wo Contrast  Result Date: 04/20/2018 CLINICAL DATA:  Pain after fall. EXAM: CT HEAD WITHOUT CONTRAST CT CERVICAL SPINE WITHOUT CONTRAST TECHNIQUE: Multidetector CT imaging of the head and cervical spine was performed following the standard protocol without intravenous contrast. Multiplanar CT image reconstructions of the cervical spine were also generated. COMPARISON:  CT of the brain May 28, 2017 FINDINGS: CT HEAD FINDINGS Brain: No subdural, epidural, or subarachnoid hemorrhage. Ventricles and sulci are stable. Cerebellum, brainstem, and basal cisterns are normal. No acute cortical ischemia or infarct identified. No mass effect or midline shift. Chronic white matter changes. Vascular: Calcified atherosclerosis in the intracranial carotids. Skull: Normal. Negative for fracture or focal lesion. Sinuses/Orbits: No acute  finding. Other: Soft tissue swelling in  the right forehead and supraorbital region. The globes are intact within visualize limits. Extracranial soft tissues otherwise normal. CT CERVICAL SPINE FINDINGS Alignment: Normal. Skull base and vertebrae: No acute fracture. No primary bone lesion or focal pathologic process. Soft tissues and spinal canal: No prevertebral fluid or swelling. No visible canal hematoma. Disc levels:  Multilevel degenerative changes. Upper chest: Negative. Other: No other abnormalities identified. IMPRESSION: 1. No acute intracranial abnormalities. Chronic white matter changes. 2. Soft tissue swelling over the right forehead and supraorbital region. 3. No fracture or traumatic malalignment in the cervical spine. Mild multilevel degenerative changes. Electronically Signed   By: Dorise Bullion III M.D   On: 04/20/2018 19:27   Ct Shoulder Left Wo Contrast  Result Date: 04/20/2018 CLINICAL DATA:  Tripped and fell today, LEFT shoulder fracture EXAM: CT OF THE UPPER LEFT EXTREMITY WITHOUT CONTRAST TECHNIQUE: Multidetector CT imaging of the upper left extremity was performed according to the standard protocol. Sagittal and coronal MPR images reconstructed from axial data set. COMPARISON:  None. FINDINGS: Bones/Joint/Cartilage Osseous demineralization. AC joint alignment normal with degenerative changes noted. Visualized ribs intact. Scapula intact. Comminuted nondisplaced fracture of the proximal to mid LEFT humeral diaphysis. Tiny avulsion fracture is seen from the margin of the lesser tuberosity. Additionally, nondisplaced fracture plane identified at the greater tuberosity. No dislocation. No destructive bone lesions. No significant joint effusion. Ligaments Suboptimally assessed by CT. Muscles and Tendons Muscular planes symmetric and unremarkable. Soft tissues Unremarkable IMPRESSION: Comminuted nondisplaced proximal LEFT humeral diaphyseal fracture. Nondisplaced fracture at greater  tuberosity. Tiny nondisplaced fracture from the margin of the lesser tuberosity Electronically Signed   By: Lavonia Dana M.D.   On: 04/20/2018 19:30   Dg Chest Port 1 View  Result Date: 04/24/2018 CLINICAL DATA:  Initial evaluation for acute emesis, fever. EXAM: PORTABLE CHEST 1 VIEW COMPARISON:  Prior radiograph from 04/20/2013. FINDINGS: Patient rotated to the right. Allowing for rotation, cardiomegaly grossly stable. Mediastinal silhouette within normal limits. Lungs mildly hypoinflated. Mild right basilar atelectasis. No focal infiltrates. No edema or effusion. No pneumothorax. No acute osseous abnormality. IMPRESSION: 1. Shallow lung inflation with associated mild right basilar atelectasis. 2. No other active cardiopulmonary disease. Electronically Signed   By: Jeannine Boga M.D.   On: 04/24/2018 21:03   Dg Humerus Left  Result Date: 04/20/2018 CLINICAL DATA:  [place] today with arm and shoulder pain. EXAM: LEFT HUMERUS - 2+ VIEW COMPARISON:  None. FINDINGS: Spiral fracture of the humeral diaphysis in the proximal third without angulation, displacement or foreshortening. Humeral head subluxed slightly inferiorly, possibly positional. Chronic AC joint degenerative change. No other regional fracture. IMPRESSION: Spiral fracture of the humeral diaphysis in the proximal third without angulation, displacement or foreshortening. Electronically Signed   By: Nelson Chimes M.D.   On: 04/20/2018 17:03   Dg Foot 2 Views Right  Result Date: 04/24/2018 CLINICAL DATA:  73 y/o  F; hear pain with. Limited drainage. EXAM: RIGHT FOOT - 2 VIEW COMPARISON:  None. FINDINGS: Stable chronic findings of deformed and disorganized hindfoot with lateral displacement of calcaneus in pseudoarticulation to fibula, downward displacement of the talus, destruction of subtalar joint, and surrounding heterotopic calcification. Midfoot degenerative changes with osteophytosis. Vascular calcifications noted. Severe osteoarthrosis of  first metatarsophalangeal joint. Bones are demineralized. No bony destructive or erosive changes to suggest osteomyelitis. IMPRESSION: 1. No findings of osteomyelitis. 2. 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 Electronically Signed   By: Kristine Garbe M.D.   On: 04/24/2018 21:25  Today   Subjective:   [name] [name] patient feels okay denies any fevers no chills  Objective:   Blood pressure (!) 116/49, pulse (!) 51, temperature 98 F (36.7 C), temperature source Oral, resp. rate 18, height 5\' 7"  (1.702 m), weight 91 kg, SpO2 97 %.  .  Intake/Output Summary (Last 24 hours) at 04/27/2018 1152 Last data filed at 04/27/2018 1005 Gross per 24 hour  Intake 360 ml  Output 700 ml  Net -340 ml    Exam VITAL SIGNS: Blood pressure (!) 116/49, pulse (!) 51, temperature 98 F (36.7 C), temperature source Oral, resp. rate 18, height 5\' 7"  (1.702 m), weight 91 kg, SpO2 97 %.  GENERAL:  73 y.o.-year-old patient lying in the bed with no acute distress.  EYES: Pupils equal, round, reactive to light and accommodation. No scleral icterus. Extraocular muscles intact.  HEENT: Head atraumatic, normocephalic. Oropharynx and nasopharynx clear.  NECK:  Supple, no jugular venous distention. No thyroid enlargement, no tenderness.  LUNGS: Normal breath sounds bilaterally, no wheezing, rales,rhonchi or crepitation. No use of accessory muscles of respiration.  CARDIOVASCULAR: S1, S2 normal. No murmurs, rubs, or gallops.  ABDOMEN: Soft, nontender, nondistended. Bowel sounds present. No organomegaly or mass.  EXTREMITIES: No pedal edema, cyanosis, or clubbing.  NEUROLOGIC: Cranial nerves II through XII are intact. Muscle strength 5/5 in all extremities. Sensation intact. Gait not checked.  PSYCHIATRIC: The patient is alert and oriented x 3.  SKIN: No obvious rash, lesion, or ulcer.   Data Review     CBC w Diff:  Lab Results  Component Value Date    WBC 9.8 04/27/2018   HGB 8.7 (L) 04/27/2018   HGB 10.7 (L) 04/17/2014   HCT 26.9 (L) 04/27/2018   HCT 32.9 (L) 04/17/2014   PLT 171 04/27/2018   PLT 226 04/17/2014   LYMPHOPCT 17 04/27/2018   LYMPHOPCT 25.4 04/21/2013   MONOPCT 13 04/27/2018   MONOPCT 8.6 04/21/2013   EOSPCT 1 04/27/2018   EOSPCT 0.6 04/21/2013   BASOPCT 0 04/27/2018   BASOPCT 0.6 04/21/2013   CMP:  Lab Results  Component Value Date   NA 135 04/27/2018   NA 142 04/17/2014   K 4.5 04/27/2018   K 4.0 04/17/2014   CL 109 04/27/2018   CL 109 (H) 04/17/2014   CO2 18 (L) 04/27/2018   CO2 24 04/17/2014   BUN 43 (H) 04/27/2018   BUN 22 (H) 04/17/2014   CREATININE 2.23 (H) 04/27/2018   CREATININE 1.78 (H) 04/17/2014   PROT 8.5 (H) 04/24/2018   PROT 8.2 04/17/2014   ALBUMIN 4.3 04/24/2018   ALBUMIN 3.4 04/17/2014   BILITOT 0.8 04/24/2018   BILITOT 0.3 04/17/2014   ALKPHOS 97 04/24/2018   ALKPHOS 103 04/17/2014   AST 34 04/24/2018   AST 18 04/17/2014   ALT 17 04/24/2018   ALT 13 (L) 04/17/2014  .  Micro Results Recent Results (from the past 240 hour(s))  Culture, blood (Routine x 2)     Status: None (Preliminary result)   Collection Time: 04/24/18  8:20 PM  Result Value Ref Range Status   Specimen Description BLOOD RIGHT ANTECUBITAL  Final   Special Requests   Final    BOTTLES DRAWN AEROBIC AND ANAEROBIC Blood Culture results may not be optimal due to an excessive volume of blood received in culture bottles   Culture   Final    NO GROWTH 3 DAYS Performed at [facility], [address] [place], [place] [address]  Report Status PENDING  Incomplete  Culture, blood (Routine x 2)     Status: None (Preliminary result)   Collection Time: 04/24/18  8:20 PM  Result Value Ref Range Status   Specimen Description BLOOD RIGHT ARM  Final   Special Requests   Final    BOTTLES DRAWN AEROBIC AND ANAEROBIC Blood Culture results may not be optimal due to an excessive volume of blood received in culture  bottles   Culture   Final    NO GROWTH 3 DAYS Performed at [facility], [address], [place], [place] [address]    Report Status PENDING  Incomplete  Wound or Superficial Culture     Status: None   Collection Time: 04/24/18  8:20 PM  Result Value Ref Range Status   Specimen Description   Final    WOUND RIGHT FOOT Performed at [facility], [address], [place], [place] [address]    Special Requests   Final    NONE Performed at [facility], [address] [place], [place] [address]    Gram Stain   Final    MODERATE WBC PRESENT, PREDOMINANTLY PMN MODERATE GRAM POSITIVE COCCI RARE GRAM VARIABLE ROD Performed at [facility], [place] [address], [place], [place] [address]    Culture MODERATE STREPTOCOCCUS GROUP G  Final   Report Status 04/27/2018 FINAL  Final  Urine Culture     Status: None   Collection Time: 04/25/18  1:21 AM  Result Value Ref Range Status   Specimen Description   Final    URINE, RANDOM Performed at [facility], [address] [place], [place] [address]    Special Requests   Final    NONE Performed at [facility], [address], [place], [place] [address]    Culture   Final    NO GROWTH Performed at [facility], [place] [address] [place], [place] [address]    Report Status 04/26/2018 FINAL  Final        Code Status Orders  (From admission, onward)         Start     Ordered   04/25/18 0212  Full code  Continuous     04/25/18 0211        Code Status History    This patient has a current code status but no historical code status.          Follow-up Information    Marguerita Merles, MD. Schedule an appointment as soon as possible for a visit in 6 days.   Specialty:  Family Medicine Why:  Please call to make an appointment for hospital follow-up Contact information: [place] [address] [phone]           Discharge Medications    Allergies as of 04/27/2018   No Known Allergies     Medication List    STOP taking these medications   oxyCODONE-acetaminophen 5-325 MG tablet Commonly known as:  PERCOCET     TAKE these medications   amLODipine 10 MG tablet Commonly known as:  NORVASC Take 1 tablet by mouth daily.   amoxicillin-clavulanate 875-125 MG tablet Commonly known as:  AUGMENTIN Take 1 tablet by mouth every 12 (twelve) hours for 5 days.   atorvastatin 40 MG tablet Commonly known as:  LIPITOR Take 1 tablet by mouth daily.   carvedilol 25 MG tablet Commonly known as:  COREG Take 1 tablet by mouth 2 (two) times  daily.   donepezil 10 MG tablet Commonly known as:  ARICEPT Take 10 mg by mouth daily.   insulin glargine 100 UNIT/ML injection Commonly known as:  LANTUS Inject 80 Units into the skin 2 (two) times daily.   KOMBIGLYZE XR 08-998 MG Tb24 Generic drug:  Saxagliptin-Metformin Take 1 tablet by mouth 2 (two) times daily.   quinapril 40 MG tablet Commonly known as:  ACCUPRIL Take 1 tablet by mouth 2 (two) times daily.   SYNTHROID 50 MCG tablet Generic drug:  levothyroxine Take 1 tablet by mouth daily.   TANZEUM 30 MG Pen Generic drug:  Albiglutide Inject 1 Dose into the skin daily.   traMADol 50 MG tablet Commonly known as:  ULTRAM Take 1 tablet (50 mg total) by mouth every 6 (six) hours as needed.   TRESIBA FLEXTOUCH 100 UNIT/ML Sopn FlexTouch Pen Generic drug:  insulin degludec Inject 10 Units into the skin 2 (two) times daily.          Total Time in preparing paper work, data evaluation and todays exam - 29 minutes  Dustin Flock M.D on 04/27/2018 at 11:52 AM [place]  [phone]

## 2018-04-29 LAB — CULTURE, BLOOD (ROUTINE X 2)
Culture: NO GROWTH
Culture: NO GROWTH

## 2018-05-05 ENCOUNTER — Telehealth: Payer: Self-pay | Admitting: Licensed Clinical Social Worker

## 2018-05-05 NOTE — Telephone Encounter (Signed)
CSW attempted to reach patient in response to an EMMI call response. There was no answer so CSW left a HIPPA compliant message and will try again later. Shela Leff MSW,LCSW [phone]

## 2018-05-08 ENCOUNTER — Telehealth: Payer: Self-pay | Admitting: Licensed Clinical Social Worker

## 2018-05-08 NOTE — Telephone Encounter (Signed)
CSW contacted patient regarding an EMMI response. CSW was able to reach patient on second attempt. Patient denies answering that she was sad or having feelings of hopelessness. Patient had no further concerns. Shela Leff MSW,LcSW [phone]

## 2018-06-10 DIAGNOSIS — S42202A Unspecified fracture of upper end of left humerus, initial encounter for closed fracture: Secondary | ICD-10-CM | POA: Insufficient documentation

## 2018-08-10 ENCOUNTER — Other Ambulatory Visit: Payer: Self-pay | Admitting: Family Medicine

## 2018-08-10 ENCOUNTER — Other Ambulatory Visit (HOSPITAL_COMMUNITY): Payer: Self-pay | Admitting: Family Medicine

## 2018-08-10 DIAGNOSIS — R519 Headache, unspecified: Secondary | ICD-10-CM

## 2018-08-10 DIAGNOSIS — R51 Headache: Principal | ICD-10-CM

## 2018-09-08 ENCOUNTER — Other Ambulatory Visit: Payer: Self-pay

## 2018-09-08 ENCOUNTER — Ambulatory Visit
Admission: RE | Admit: 2018-09-08 | Discharge: 2018-09-08 | Disposition: A | Discharge status: Home / Self Care | Payer: Medicare HMO | Source: Ambulatory Visit | Attending: Family Medicine | Admitting: Family Medicine

## 2018-09-08 DIAGNOSIS — R51 Headache: Secondary | ICD-10-CM | POA: Diagnosis not present

## 2018-09-08 DIAGNOSIS — R519 Headache, unspecified: Secondary | ICD-10-CM

## 2018-11-06 ENCOUNTER — Other Ambulatory Visit: Payer: Self-pay | Admitting: Family Medicine

## 2018-11-06 DIAGNOSIS — M8580 Other specified disorders of bone density and structure, unspecified site: Secondary | ICD-10-CM

## 2018-11-06 DIAGNOSIS — Z1231 Encounter for screening mammogram for malignant neoplasm of breast: Secondary | ICD-10-CM

## 2018-11-25 ENCOUNTER — Encounter: Payer: Self-pay | Admitting: *Deleted

## 2018-11-25 ENCOUNTER — Other Ambulatory Visit: Payer: Self-pay

## 2018-11-27 ENCOUNTER — Other Ambulatory Visit: Payer: Self-pay

## 2018-11-27 ENCOUNTER — Other Ambulatory Visit
Admission: RE | Admit: 2018-11-27 | Discharge: 2018-11-27 | Disposition: A | Discharge status: Home / Self Care | Payer: Medicare HMO | Source: Ambulatory Visit | Attending: Ophthalmology | Admitting: Ophthalmology

## 2018-11-27 DIAGNOSIS — Z01812 Encounter for preprocedural laboratory examination: Secondary | ICD-10-CM | POA: Diagnosis present

## 2018-11-27 DIAGNOSIS — Z20828 Contact with and (suspected) exposure to other viral communicable diseases: Secondary | ICD-10-CM | POA: Insufficient documentation

## 2018-11-27 LAB — SARS CORONAVIRUS 2 (TAT 6-24 HRS): SARS Coronavirus 2: NEGATIVE

## 2018-11-27 NOTE — Discharge Instructions (Signed)

## 2018-12-01 ENCOUNTER — Ambulatory Visit: Payer: Medicare HMO | Admitting: Anesthesiology

## 2018-12-01 ENCOUNTER — Other Ambulatory Visit: Payer: Self-pay

## 2018-12-01 ENCOUNTER — Ambulatory Visit
Admission: RE | Admit: 2018-12-01 | Discharge: 2018-12-01 | Disposition: A | Discharge status: Home / Self Care | Payer: Medicare HMO | Attending: Ophthalmology | Admitting: Ophthalmology

## 2018-12-01 ENCOUNTER — Encounter
Admission: RE | Disposition: A | Discharge status: Home / Self Care | Payer: Self-pay | Source: Home / Self Care | Attending: Ophthalmology

## 2018-12-01 DIAGNOSIS — N189 Chronic kidney disease, unspecified: Secondary | ICD-10-CM | POA: Insufficient documentation

## 2018-12-01 DIAGNOSIS — Z794 Long term (current) use of insulin: Secondary | ICD-10-CM | POA: Insufficient documentation

## 2018-12-01 DIAGNOSIS — I10 Essential (primary) hypertension: Secondary | ICD-10-CM | POA: Diagnosis not present

## 2018-12-01 DIAGNOSIS — E785 Hyperlipidemia, unspecified: Secondary | ICD-10-CM | POA: Insufficient documentation

## 2018-12-01 DIAGNOSIS — I129 Hypertensive chronic kidney disease with stage 1 through stage 4 chronic kidney disease, or unspecified chronic kidney disease: Secondary | ICD-10-CM | POA: Diagnosis not present

## 2018-12-01 DIAGNOSIS — E1122 Type 2 diabetes mellitus with diabetic chronic kidney disease: Secondary | ICD-10-CM | POA: Insufficient documentation

## 2018-12-01 DIAGNOSIS — Z79899 Other long term (current) drug therapy: Secondary | ICD-10-CM | POA: Diagnosis not present

## 2018-12-01 DIAGNOSIS — Z79890 Hormone replacement therapy (postmenopausal): Secondary | ICD-10-CM | POA: Diagnosis not present

## 2018-12-01 DIAGNOSIS — H2512 Age-related nuclear cataract, left eye: Secondary | ICD-10-CM | POA: Diagnosis not present

## 2018-12-01 DIAGNOSIS — E1136 Type 2 diabetes mellitus with diabetic cataract: Secondary | ICD-10-CM | POA: Diagnosis not present

## 2018-12-01 DIAGNOSIS — E039 Hypothyroidism, unspecified: Secondary | ICD-10-CM | POA: Diagnosis not present

## 2018-12-01 HISTORY — DX: Hypothyroidism, unspecified: E03.9

## 2018-12-01 HISTORY — DX: Presence of dental prosthetic device (complete) (partial): Z97.2

## 2018-12-01 HISTORY — PX: CATARACT EXTRACTION W/PHACO: SHX586

## 2018-12-01 HISTORY — DX: Dizziness and giddiness: R42

## 2018-12-01 HISTORY — DX: Hyperlipidemia, unspecified: E78.5

## 2018-12-01 LAB — GLUCOSE, CAPILLARY
Glucose-Capillary: 102 mg/dL — ABNORMAL HIGH (ref 70–99)
Glucose-Capillary: 98 mg/dL (ref 70–99)

## 2018-12-01 SURGERY — PHACOEMULSIFICATION, CATARACT, WITH IOL INSERTION
Anesthesia: Monitor Anesthesia Care | Site: Eye | Laterality: Left

## 2018-12-01 MED ORDER — MOXIFLOXACIN HCL 0.5 % OP SOLN
OPHTHALMIC | Status: DC | PRN
  Administered 2018-12-01: 0.2 mL via OPHTHALMIC

## 2018-12-01 MED ORDER — EPINEPHRINE PF 1 MG/ML IJ SOLN
INTRAOCULAR | Status: DC | PRN
  Administered 2018-12-01: 75 mL via OPHTHALMIC

## 2018-12-01 MED ORDER — TETRACAINE HCL 0.5 % OP SOLN
1.0000 [drp] | OPHTHALMIC | Status: DC | PRN
  Administered 2018-12-01 (×3): 1 [drp] via OPHTHALMIC

## 2018-12-01 MED ORDER — PHENYLEPHRINE HCL 10 % OP SOLN
1.0000 [drp] | OPHTHALMIC | Status: DC | PRN
  Administered 2018-12-01: 1 [drp] via OPHTHALMIC

## 2018-12-01 MED ORDER — BRIMONIDINE TARTRATE-TIMOLOL 0.2-0.5 % OP SOLN
OPHTHALMIC | Status: DC | PRN
  Administered 2018-12-01: 1 [drp] via OPHTHALMIC

## 2018-12-01 MED ORDER — LIDOCAINE HCL (PF) 2 % IJ SOLN
INTRAOCULAR | Status: DC | PRN
  Administered 2018-12-01: 1 mL

## 2018-12-01 MED ORDER — MIDAZOLAM HCL 2 MG/2ML IJ SOLN
INTRAMUSCULAR | Status: DC | PRN
  Administered 2018-12-01 (×2): 1 mg via INTRAVENOUS

## 2018-12-01 MED ORDER — LACTATED RINGERS IV SOLN: INTRAVENOUS | Status: DC

## 2018-12-01 MED ORDER — NA CHONDROIT SULF-NA HYALURON 40-17 MG/ML IO SOLN
INTRAOCULAR | Status: DC | PRN
  Administered 2018-12-01: 1 mL via INTRAOCULAR

## 2018-12-01 MED ORDER — FENTANYL CITRATE (PF) 100 MCG/2ML IJ SOLN
INTRAMUSCULAR | Status: DC | PRN
  Administered 2018-12-01: 50 ug via INTRAVENOUS

## 2018-12-01 MED ORDER — ACETAMINOPHEN 160 MG/5ML PO SOLN: 325.0000 mg | Freq: Once | ORAL | Status: DC

## 2018-12-01 MED ORDER — ACETAMINOPHEN 325 MG PO TABS: 325.0000 mg | ORAL_TABLET | Freq: Once | ORAL | Status: DC

## 2018-12-01 MED ORDER — ARMC OPHTHALMIC DILATING DROPS
1.0000 "application " | OPHTHALMIC | Status: DC | PRN
  Administered 2018-12-01 (×3): 1 via OPHTHALMIC

## 2018-12-01 SURGICAL SUPPLY — 18 items
CANNULA ANT/CHMB 27GA (MISCELLANEOUS) ×2 IMPLANT
GLOVE SURG LX 8.0 MICRO (GLOVE) ×1
GLOVE SURG LX STRL 8.0 MICRO (GLOVE) ×1 IMPLANT
GLOVE SURG TRIUMPH 8.0 PF LTX (GLOVE) ×2 IMPLANT
GOWN STRL REUS W/ TWL LRG LVL3 (GOWN DISPOSABLE) ×2 IMPLANT
GOWN STRL REUS W/TWL LRG LVL3 (GOWN DISPOSABLE) ×2
LENS IOL TECNIS ITEC 26.0 (Intraocular Lens) ×2 IMPLANT
MARKER SKIN DUAL TIP RULER LAB (MISCELLANEOUS) ×2 IMPLANT
NDL RETROBULBAR .5 NSTRL (NEEDLE) ×2 IMPLANT
NEEDLE FILTER BLUNT 18X 1/2SAF (NEEDLE) ×1
NEEDLE FILTER BLUNT 18X1 1/2 (NEEDLE) ×1 IMPLANT
PACK EYE AFTER SURG (MISCELLANEOUS) ×2 IMPLANT
PACK OPTHALMIC (MISCELLANEOUS) ×2 IMPLANT
PACK PORFILIO (MISCELLANEOUS) ×2 IMPLANT
SYR 3ML LL SCALE MARK (SYRINGE) ×2 IMPLANT
SYR TB 1ML LUER SLIP (SYRINGE) ×2 IMPLANT
WATER STERILE IRR 250ML POUR (IV SOLUTION) ×2 IMPLANT
WIPE NON LINTING 3.25X3.25 (MISCELLANEOUS) ×2 IMPLANT

## 2018-12-01 NOTE — Anesthesia Preprocedure Evaluation (Signed)
Anesthesia Evaluation  Patient identified by MRN, date of birth, ID band Patient awake    Reviewed: Allergy & Precautions, H&P , NPO status , Patient's Chart, lab work & pertinent test results  Airway Mallampati: II  TM Distance: >3 FB Neck ROM: full    Dental no notable dental hx.    Pulmonary    Pulmonary exam normal breath sounds clear to auscultation       Cardiovascular hypertension, Normal cardiovascular exam Rhythm:regular Rate:Normal     Neuro/Psych    GI/Hepatic   Endo/Other  diabetesHypothyroidism   Renal/GU      Musculoskeletal   Abdominal   Peds  Hematology   Anesthesia Other Findings   Reproductive/Obstetrics                             Anesthesia Physical Anesthesia Plan  ASA: II  Anesthesia Plan: MAC   Post-op Pain Management:    Induction:   PONV Risk Score and Plan: 2 and Midazolam, TIVA and Treatment may vary due to age or medical condition  Airway Management Planned:   Additional Equipment:   Intra-op Plan:   Post-operative Plan:   Informed Consent: I have reviewed the patients History and Physical, chart, labs and discussed the procedure including the risks, benefits and alternatives for the proposed anesthesia with the patient or authorized representative who has indicated his/her understanding and acceptance.       Plan Discussed with: CRNA  Anesthesia Plan Comments:         Anesthesia Quick Evaluation

## 2018-12-01 NOTE — CV Procedure (Signed)
PREOPERATIVE DIAGNOSIS:  Nuclear sclerotic cataract of the left eye.   POSTOPERATIVE DIAGNOSIS:  Nuclear sclerotic cataract of the left eye.   OPERATIVE PROCEDURE: Procedure(s): CATARACT EXTRACTION PHACO AND INTRAOCULAR LENS PLACEMENT ([facility] [place]) LEFT DIABETIC   SURGEON:  Birder Robson, MD.   ANESTHESIA:  Anesthesiologist: Ronelle Nigh, MD CRNA: Cameron Ali, CRNA  1.      Managed anesthesia care. 2.     0.5ml of Shugarcaine was instilled following the paracentesis   COMPLICATIONS:  None.   TECHNIQUE:   Stop and chop   DESCRIPTION OF PROCEDURE:  The patient was examined and consented in the preoperative holding area where the aforementioned topical anesthesia was applied to the left eye and then brought back to the Operating Room where the left eye was prepped and draped in the usual sterile ophthalmic fashion and a lid speculum was placed. A paracentesis was created with the side port blade and the anterior chamber was filled with viscoelastic. A near clear corneal incision was performed with the steel keratome. A continuous curvilinear capsulorrhexis was performed with a cystotome followed by the capsulorrhexis forceps. Hydrodissection and hydrodelineation were carried out with BSS on a blunt cannula. The lens was removed in a stop and chop  technique and the remaining cortical material was removed with the irrigation-aspiration handpiece. The capsular bag was inflated with viscoelastic and the Technis [id number] lens was placed in the capsular bag without complication. The remaining viscoelastic was removed from the eye with the irrigation-aspiration handpiece. The wounds were hydrated. The anterior chamber was flushed with BSS and the eye was inflated to physiologic pressure. 0.48ml Vigamox was placed in the anterior chamber. The wounds were found to be water tight. The eye was dressed with Combigan. The patient was given protective glasses to wear throughout the day and a shield with which to  sleep tonight. The patient was also given drops with which to begin a drop regimen today and will follow-up with me in one day. Implant Name Type Inv. Item Serial No. Manufacturer Lot No. LRB No. Used Action  LENS IOL DIOP 26.0 - [id number] Intraocular Lens LENS IOL DIOP 26.0 [id number] AMO  Left 1 Implanted    Procedure(s) with comments: CATARACT EXTRACTION PHACO AND INTRAOCULAR LENS PLACEMENT ([facility]) LEFT DIABETIC (Left) - Diabetic - insulin  Electronically signed: Birder Robson 12/01/2018 8:24 AM

## 2018-12-01 NOTE — Transfer of Care (Signed)
Immediate Anesthesia Transfer of Care Note  Patient: [name] [name]  Procedure(s) Performed: CATARACT EXTRACTION PHACO AND INTRAOCULAR LENS PLACEMENT ([facility]) LEFT DIABETIC (Left Eye)  Patient Location: PACU  Anesthesia Type: MAC  Level of Consciousness: awake, alert  and patient cooperative  Airway and Oxygen Therapy: Patient Spontanous Breathing and Patient connected to supplemental oxygen  Post-op Assessment: Post-op Vital signs reviewed, Patient's Cardiovascular Status Stable, Respiratory Function Stable, Patent Airway and No signs of Nausea or vomiting  Post-op Vital Signs: Reviewed and stable  Complications: No apparent anesthesia complications

## 2018-12-01 NOTE — Anesthesia Procedure Notes (Signed)
Procedure Name: MAC Date/Time: 12/01/2018 8:02 AM Performed by: Cameron Ali, CRNA Pre-anesthesia Checklist: Patient identified, Emergency Drugs available, Suction available, Timeout performed and Patient being monitored Patient Re-evaluated:Patient Re-evaluated prior to induction Oxygen Delivery Method: Nasal cannula Placement Confirmation: positive ETCO2

## 2018-12-01 NOTE — Anesthesia Postprocedure Evaluation (Signed)
Anesthesia Post Note  Patient: [name] [name]  Procedure(s) Performed: CATARACT EXTRACTION PHACO AND INTRAOCULAR LENS PLACEMENT ([facility]) LEFT DIABETIC (Left Eye)  Patient location during evaluation: PACU Anesthesia Type: MAC Level of consciousness: awake and alert and oriented Pain management: satisfactory to patient Vital Signs Assessment: post-procedure vital signs reviewed and stable Respiratory status: spontaneous breathing, nonlabored ventilation and respiratory function stable Cardiovascular status: blood pressure returned to baseline and stable Postop Assessment: Adequate PO intake and No signs of nausea or vomiting Anesthetic complications: no    Raliegh Ip

## 2018-12-01 NOTE — H&P (Signed)
All labs reviewed. Abnormal studies sent to patients PCP when indicated.  Previous H&P reviewed, patient examined, there are NO CHANGES.  [name] [name] Porfilio8/25/20207:30 AM

## 2018-12-02 ENCOUNTER — Encounter: Payer: Self-pay | Admitting: Ophthalmology

## 2019-01-28 ENCOUNTER — Emergency Department: Payer: Medicare HMO

## 2019-01-28 ENCOUNTER — Other Ambulatory Visit: Payer: Self-pay

## 2019-01-28 ENCOUNTER — Encounter: Payer: Self-pay | Admitting: Emergency Medicine

## 2019-01-28 ENCOUNTER — Emergency Department
Admission: EM | Admit: 2019-01-28 | Discharge: 2019-01-28 | Disposition: A | Discharge status: Home / Self Care | Payer: Medicare HMO | Attending: Emergency Medicine | Admitting: Emergency Medicine

## 2019-01-28 DIAGNOSIS — E119 Type 2 diabetes mellitus without complications: Secondary | ICD-10-CM | POA: Diagnosis not present

## 2019-01-28 DIAGNOSIS — R0789 Other chest pain: Secondary | ICD-10-CM | POA: Diagnosis not present

## 2019-01-28 DIAGNOSIS — R1012 Left upper quadrant pain: Secondary | ICD-10-CM | POA: Insufficient documentation

## 2019-01-28 DIAGNOSIS — Z7982 Long term (current) use of aspirin: Secondary | ICD-10-CM | POA: Insufficient documentation

## 2019-01-28 DIAGNOSIS — Z79899 Other long term (current) drug therapy: Secondary | ICD-10-CM | POA: Diagnosis not present

## 2019-01-28 DIAGNOSIS — E039 Hypothyroidism, unspecified: Secondary | ICD-10-CM | POA: Insufficient documentation

## 2019-01-28 DIAGNOSIS — Z794 Long term (current) use of insulin: Secondary | ICD-10-CM | POA: Diagnosis not present

## 2019-01-28 DIAGNOSIS — I10 Essential (primary) hypertension: Secondary | ICD-10-CM | POA: Diagnosis not present

## 2019-01-28 DIAGNOSIS — R079 Chest pain, unspecified: Secondary | ICD-10-CM | POA: Diagnosis present

## 2019-01-28 LAB — COMPREHENSIVE METABOLIC PANEL
ALT: 17 U/L (ref 0–44)
AST: 23 U/L (ref 15–41)
Albumin: 4.1 g/dL (ref 3.5–5.0)
Alkaline Phosphatase: 101 U/L (ref 38–126)
Anion gap: 10 (ref 5–15)
BUN: 26 mg/dL — ABNORMAL HIGH (ref 8–23)
CO2: 17 mmol/L — ABNORMAL LOW (ref 22–32)
Calcium: 9.4 mg/dL (ref 8.9–10.3)
Chloride: 113 mmol/L — ABNORMAL HIGH (ref 98–111)
Creatinine, Ser: 1.59 mg/dL — ABNORMAL HIGH (ref 0.44–1.00)
GFR calc Af Amer: 37 mL/min — ABNORMAL LOW (ref >60)
GFR calc non Af Amer: 32 mL/min — ABNORMAL LOW (ref >60)
Glucose, Bld: 88 mg/dL (ref 70–99)
Potassium: 5.1 mmol/L (ref 3.5–5.1)
Sodium: 140 mmol/L (ref 135–145)
Total Bilirubin: 0.6 mg/dL (ref 0.3–1.2)
Total Protein: 8.5 g/dL — ABNORMAL HIGH (ref 6.5–8.1)

## 2019-01-28 LAB — CBC WITH DIFFERENTIAL/PLATELET
Abs Immature Granulocytes: 0.02 10*3/uL (ref 0.00–0.07)
Basophils Absolute: 0 10*3/uL (ref 0.0–0.1)
Basophils Relative: 0 %
Eosinophils Absolute: 0.1 10*3/uL (ref 0.0–0.5)
Eosinophils Relative: 1 %
HCT: 35.9 % — ABNORMAL LOW (ref 36.0–46.0)
Hemoglobin: 11.2 g/dL — ABNORMAL LOW (ref 12.0–15.0)
Immature Granulocytes: 0 %
Lymphocytes Relative: 32 %
Lymphs Abs: 2.8 10*3/uL (ref 0.7–4.0)
MCH: 29 pg (ref 26.0–34.0)
MCHC: 31.2 g/dL (ref 30.0–36.0)
MCV: 93 fL (ref 80.0–100.0)
Monocytes Absolute: 0.7 10*3/uL (ref 0.1–1.0)
Monocytes Relative: 8 %
Neutro Abs: 5.1 10*3/uL (ref 1.7–7.7)
Neutrophils Relative %: 59 %
Platelets: 212 10*3/uL (ref 150–400)
RBC: 3.86 MIL/uL — ABNORMAL LOW (ref 3.87–5.11)
RDW: 13.1 % (ref 11.5–15.5)
WBC: 8.6 10*3/uL (ref 4.0–10.5)
nRBC: 0 % (ref 0.0–0.2)

## 2019-01-28 LAB — LIPASE, BLOOD: Lipase: 31 U/L (ref 11–51)

## 2019-01-28 LAB — TROPONIN I (HIGH SENSITIVITY): Troponin I (High Sensitivity): 7 ng/L (ref <18)

## 2019-01-28 MED ORDER — LIDOCAINE 5 % EX PTCH
1.0000 | MEDICATED_PATCH | CUTANEOUS | Status: DC
  Administered 2019-01-28: 1 via TRANSDERMAL
  Filled 2019-01-28: qty 1

## 2019-01-28 MED ORDER — ACETAMINOPHEN 500 MG PO TABS
1000.0000 mg | ORAL_TABLET | Freq: Once | ORAL | Status: AC
  Administered 2019-01-28: 1000 mg via ORAL
  Filled 2019-01-28: qty 2

## 2019-01-28 MED ORDER — LIDOCAINE 5 % EX PTCH: 1.0000 | MEDICATED_PATCH | Freq: Two times a day (BID) | CUTANEOUS | 0 refills | Status: DC

## 2019-01-28 MED ORDER — ONDANSETRON 4 MG PO TBDP
4.0000 mg | ORAL_TABLET | Freq: Once | ORAL | Status: AC
  Administered 2019-01-28: 4 mg via ORAL
  Filled 2019-01-28: qty 1

## 2019-01-28 NOTE — ED Provider Notes (Signed)
[facility] Emergency Department Provider Note   ____________________________________________   First MD Initiated Contact with Patient 01/28/19 1200     (approximate)  I have reviewed the triage vital signs and the nursing notes.   HISTORY  Chief Complaint Hypertension    HPI [name] [name] is a 73 y.o. female with past medical history of hypertension, hyperlipidemia, and diabetes presents to the ED complaining of chest pain and elevated blood pressure.  Patient reports she has been dealing with pain over her left lateral chest wall intermittently for the past 3 days.  It worsens with any movement or changes in position and the area is sore to touch.  She denies any associated fevers, cough, or shortness of breath.  She has not noticed any pain or swelling in her legs.  She denies any recent falls or other trauma to this area.  She was concerned earlier today that her blood pressure might be elevated, and when she spoke with her PCPs office, was referred to the ED for further evaluation.  She complains of similar pain over the left upper quadrant of her abdomen as well as her anterior chest, but pain is maximal at her left lateral chest wall.        Past Medical History:  Diagnosis Date  . Diabetes mellitus without complication ([place])   . Humerus fracture 04/20/2018   left  . Hyperlipidemia   . Hypertension   . Hypothyroidism   . Vertigo    several yrs ago  . Wears dentures    partial upper and lower (loose)    Patient Active Problem List   Diagnosis Date Noted  . Sepsis ([place]) 04/25/2018    Past Surgical History:  Procedure Laterality Date  . CATARACT EXTRACTION W/PHACO Left 12/01/2018   Procedure: CATARACT EXTRACTION PHACO AND INTRAOCULAR LENS PLACEMENT ([place]) LEFT DIABETIC;  Surgeon: Birder Robson, MD;  Location: [place];  Service: Ophthalmology;  Laterality: Left;  Diabetic - insulin  . FOOT SURGERY Right    lesion excision    Prior to Admission medications   Medication Sig Start Date End Date Taking? Authorizing Provider  amLODipine (NORVASC) 10 MG tablet Take 1 tablet by mouth daily. 05/02/16   [provider]  ASPIRIN 81 PO Take by mouth daily.    [provider]  atorvastatin (LIPITOR) 40 MG tablet Take 1 tablet by mouth daily. 05/02/16   [provider]  CALCIUM PO Take by mouth daily.    [provider]  carvedilol (COREG) 25 MG tablet Take 1 tablet by mouth 2 (two) times daily. 05/02/16   [provider]  Cholecalciferol (VITAMIN D3 PO) Take by mouth daily.    [provider]  donepezil (ARICEPT) 10 MG tablet Take 10 mg by mouth daily. 04/21/18   [provider]  insulin aspart protamine- aspart (NOVOLOG MIX 70/30) (70-30) 100 UNIT/ML injection Inject 15 Units into the skin daily with breakfast.    [provider]  lidocaine (LIDODERM) 5 % Place 1 patch onto the skin every 12 (twelve) hours. Remove & Discard patch within 12 hours or as directed by MD 01/28/19 01/28/20  Blake Divine, MD  LUTEIN PO Take by mouth daily.    [provider]  quinapril (ACCUPRIL) 40 MG tablet Take 1 tablet by mouth 2 (two) times daily. 05/02/16   [provider]  SYNTHROID 50 MCG tablet Take 1 tablet by mouth daily. 04/21/18   [provider]  traMADol Veatrice Bourbon) 50  MG tablet Take 1 tablet (50 mg total) by mouth every 6 (six) hours as needed. 04/27/18 04/27/19  Dustin Flock, MD  TRESIBA FLEXTOUCH 100 UNIT/ML SOPN FlexTouch Pen Inject 60 Units into the skin at bedtime.  04/21/18   [provider]    Allergies Patient has no known allergies.  Family History  Problem Relation Age of Onset  . Breast cancer Neg Hx     Social History Social History   Tobacco Use  . Smoking status: Never Smoker  . Smokeless tobacco: Never Used  Substance Use Topics  . Alcohol use: No  . Drug use: Not on file    Review of Systems   Constitutional: No fever/chills Eyes: No visual changes. ENT: No sore throat. Cardiovascular: Positive for chest pain. Respiratory: Denies shortness of breath. Gastrointestinal: Positive for abdominal pain.  Positive for nausea, no vomiting.  No diarrhea.  No constipation. Genitourinary: Negative for dysuria. Musculoskeletal: Negative for back pain. Skin: Negative for rash. Neurological: Negative for headaches, focal weakness or numbness.  ____________________________________________   PHYSICAL EXAM:  VITAL SIGNS: ED Triage Vitals  Enc Vitals Group     BP 01/28/19 1033 (!) 164/75     Pulse Rate 01/28/19 1033 (!) 59     Resp 01/28/19 1033 16     Temp 01/28/19 1033 98.3 F (36.8 C)     Temp Source 01/28/19 1033 Oral     SpO2 01/28/19 1033 98 %     Weight 01/28/19 1034 202 lb (91.6 kg)     Height 01/28/19 1034 5\' 7"  (1.702 m)     Head Circumference --      Peak Flow --      Pain Score 01/28/19 1034 0     Pain Loc --      Pain Edu? --      Excl. in [place]? --     Constitutional: Alert and oriented. Eyes: Conjunctivae are normal. Head: Atraumatic. Nose: No congestion/rhinnorhea. Mouth/Throat: Mucous membranes are moist. Neck: Normal ROM Cardiovascular: Normal rate, regular rhythm. Grossly normal heart sounds. Respiratory: Normal respiratory effort.  No retractions. Lungs CTAB.  Tenderness over left lateral chest wall as well as anterior chest which reproduces patient's pain. Gastrointestinal: Soft and nontender. No distention. Genitourinary: deferred Musculoskeletal: No lower extremity tenderness nor edema. Neurologic:  Normal speech and language. No gross focal neurologic deficits are appreciated. Skin:  Skin is warm, dry and intact. No rash noted. Psychiatric: Mood and affect are normal. Speech and behavior are normal.  ____________________________________________   LABS (all labs ordered are listed, but only abnormal results are displayed)  Labs Reviewed  CBC WITH  DIFFERENTIAL/PLATELET - Abnormal; Notable for the following components:      Result Value   RBC 3.86 (*)    Hemoglobin 11.2 (*)    HCT 35.9 (*)    All other components within normal limits  COMPREHENSIVE METABOLIC PANEL - Abnormal; Notable for the following components:   Chloride 113 (*)    CO2 17 (*)    BUN 26 (*)    Creatinine, Ser 1.59 (*)    Total Protein 8.5 (*)    GFR calc non Af Amer 32 (*)    GFR calc Af Amer 37 (*)    All other components within normal limits  LIPASE, BLOOD  TROPONIN I (HIGH SENSITIVITY)   ____________________________________________  EKG  ED ECG REPORT I, Blake Divine, the attending physician, personally viewed and interpreted this ECG.   Date: 01/28/2019  EKG Time: 12:16  Rate: 53  Rhythm: sinus bradycardia  Axis: Normal  Intervals:none  ST&T Change: None    PROCEDURES  Procedure(s) performed (including Critical Care):  Procedures   ____________________________________________   INITIAL IMPRESSION / ASSESSMENT AND PLAN / ED COURSE       73 year old female presents to the ED with 3 to 4 days of intermittent left lateral chest wall discomfort worse with certain positions.  Pain appears most likely to be musculoskeletal in origin given association with changes in position as well as pain being reproducible on palpation.  Low suspicion for cardiac etiology, EKG is unremarkable, will screen troponin.  Will check chest x-ray but no symptoms to suggest pneumonia.  Will also screen LFTs and lipase, however low suspicion for intra-abdominal process and given lack of abdominal tenderness, do not feel advanced imaging is indicated at this time.  Lab work unremarkable, troponin within normal limits, given constant symptoms do not suspect cardiac etiology.  Her chest x-ray is negative for acute process.  LFTs and lipase within normal limits.  Pain is improved following Tylenol and Lidoderm patch, suspect musculoskeletal etiology.  Blood pressure is  reasonable here, doubt hypertensive emergency.  Counseled patient to follow-up with PCP and return to the ED for new or worsening symptoms, patient agrees with plan.      ____________________________________________   FINAL CLINICAL IMPRESSION(S) / ED DIAGNOSES  Final diagnoses:  Chest wall pain  Essential hypertension     ED Discharge Orders         Ordered    lidocaine (LIDODERM) 5 %  Every 12 hours     01/28/19 1315           Note:  This document was prepared using Dragon voice recognition software and may include unintentional dictation errors.   Blake Divine, MD 01/28/19 1319

## 2019-01-28 NOTE — ED Triage Notes (Addendum)
Patient presents to the ED for hypertension this am.  Patient states her blood pressure was 160/55.  Patient states she also has chills, felt very nauseous this am and has had a sharp pain to her left side with movement x 3 days.  Patient states her usual blood pressure is 120s/80s. Patient takes amlodipine, carvedilol,  and quinapril.  Patient has not yet taken her blood pressure medication today.  Patient states, "No, I just wanted to get over here and take care of myself."  Patient states she has not vomited and the nausea she experienced this am is intermittent.

## 2019-05-13 ENCOUNTER — Emergency Department
Admission: EM | Admit: 2019-05-13 | Discharge: 2019-05-13 | Disposition: A | Discharge status: Home / Self Care | Payer: Medicare HMO | Attending: Emergency Medicine | Admitting: Emergency Medicine

## 2019-05-13 ENCOUNTER — Encounter: Payer: Self-pay | Admitting: Emergency Medicine

## 2019-05-13 ENCOUNTER — Other Ambulatory Visit: Payer: Self-pay

## 2019-05-13 ENCOUNTER — Emergency Department: Payer: Medicare HMO

## 2019-05-13 DIAGNOSIS — E039 Hypothyroidism, unspecified: Secondary | ICD-10-CM | POA: Insufficient documentation

## 2019-05-13 DIAGNOSIS — Z7982 Long term (current) use of aspirin: Secondary | ICD-10-CM | POA: Diagnosis not present

## 2019-05-13 DIAGNOSIS — I509 Heart failure, unspecified: Secondary | ICD-10-CM | POA: Diagnosis not present

## 2019-05-13 DIAGNOSIS — Z794 Long term (current) use of insulin: Secondary | ICD-10-CM | POA: Insufficient documentation

## 2019-05-13 DIAGNOSIS — Z79899 Other long term (current) drug therapy: Secondary | ICD-10-CM | POA: Insufficient documentation

## 2019-05-13 DIAGNOSIS — R0602 Shortness of breath: Secondary | ICD-10-CM | POA: Diagnosis present

## 2019-05-13 DIAGNOSIS — I110 Hypertensive heart disease with heart failure: Secondary | ICD-10-CM | POA: Diagnosis not present

## 2019-05-13 DIAGNOSIS — E119 Type 2 diabetes mellitus without complications: Secondary | ICD-10-CM | POA: Insufficient documentation

## 2019-05-13 LAB — TROPONIN I (HIGH SENSITIVITY)
Troponin I (High Sensitivity): 8 ng/L (ref <18)
Troponin I (High Sensitivity): 7 ng/L (ref <18)

## 2019-05-13 LAB — CBC
HCT: 28.3 % — ABNORMAL LOW (ref 36.0–46.0)
Hemoglobin: 8.7 g/dL — ABNORMAL LOW (ref 12.0–15.0)
MCH: 29 pg (ref 26.0–34.0)
MCHC: 30.7 g/dL (ref 30.0–36.0)
MCV: 94.3 fL (ref 80.0–100.0)
Platelets: 164 10*3/uL (ref 150–400)
RBC: 3 MIL/uL — ABNORMAL LOW (ref 3.87–5.11)
RDW: 14 % (ref 11.5–15.5)
WBC: 8.3 10*3/uL (ref 4.0–10.5)
nRBC: 0 % (ref 0.0–0.2)

## 2019-05-13 LAB — BASIC METABOLIC PANEL
Anion gap: 12 (ref 5–15)
BUN: 35 mg/dL — ABNORMAL HIGH (ref 8–23)
CO2: 15 mmol/L — ABNORMAL LOW (ref 22–32)
Calcium: 8.8 mg/dL — ABNORMAL LOW (ref 8.9–10.3)
Chloride: 112 mmol/L — ABNORMAL HIGH (ref 98–111)
Creatinine, Ser: 2.05 mg/dL — ABNORMAL HIGH (ref 0.44–1.00)
GFR calc Af Amer: 27 mL/min — ABNORMAL LOW (ref >60)
GFR calc non Af Amer: 24 mL/min — ABNORMAL LOW (ref >60)
Glucose, Bld: 141 mg/dL — ABNORMAL HIGH (ref 70–99)
Potassium: 4.7 mmol/L (ref 3.5–5.1)
Sodium: 139 mmol/L (ref 135–145)

## 2019-05-13 LAB — URINALYSIS, COMPLETE (UACMP) WITH MICROSCOPIC
Bacteria, UA: NONE SEEN
Bilirubin Urine: NEGATIVE
Glucose, UA: NEGATIVE mg/dL
Hgb urine dipstick: NEGATIVE
Ketones, ur: NEGATIVE mg/dL
Leukocytes,Ua: NEGATIVE
Nitrite: NEGATIVE
Protein, ur: 30 mg/dL — AB
Specific Gravity, Urine: 1.011 (ref 1.005–1.030)
pH: 5 (ref 5.0–8.0)

## 2019-05-13 MED ORDER — IPRATROPIUM-ALBUTEROL 0.5-2.5 (3) MG/3ML IN SOLN
3.0000 mL | Freq: Once | RESPIRATORY_TRACT | Status: AC
  Administered 2019-05-13: 3 mL via RESPIRATORY_TRACT
  Filled 2019-05-13: qty 3

## 2019-05-13 MED ORDER — FUROSEMIDE 20 MG PO TABS: 20.0000 mg | ORAL_TABLET | Freq: Every day | ORAL | 0 refills | Status: DC

## 2019-05-13 MED ORDER — FUROSEMIDE 10 MG/ML IJ SOLN
60.0000 mg | Freq: Once | INTRAMUSCULAR | Status: AC
  Administered 2019-05-13: 19:00:00 60 mg via INTRAVENOUS
  Filled 2019-05-13: qty 8

## 2019-05-13 NOTE — ED Provider Notes (Signed)
[facility] Emergency Department Provider Note  Time seen: 6:36 PM  I have reviewed the triage vital signs and the nursing notes.   HISTORY  Chief Complaint Shortness of Breath   HPI [name] [name] is a 74 y.o. female with a past medical history of diabetes, hypertension, hyperlipidemia, obesity, presents emergency department for shortness of breath.  According to the patient over the past 2 to 3 weeks she has been feeling short of breath especially with exertion.  Denies any chest pain.  Denies any fever increased cough.  Patient states she had a Covid test performed on Monday.  Denies any lower extremity edema.   Past Medical History:  Diagnosis Date  . Diabetes mellitus without complication ([place])   . Humerus fracture 04/20/2018   left  . Hyperlipidemia   . Hypertension   . Hypothyroidism   . Vertigo    several yrs ago  . Wears dentures    partial upper and lower (loose)    Patient Active Problem List   Diagnosis Date Noted  . Sepsis ([place]) 04/25/2018    Past Surgical History:  Procedure Laterality Date  . CATARACT EXTRACTION W/PHACO Left 12/01/2018   Procedure: CATARACT EXTRACTION PHACO AND INTRAOCULAR LENS PLACEMENT ([place]) LEFT DIABETIC;  Surgeon: Birder Robson, MD;  Location: [place];  Service: Ophthalmology;  Laterality: Left;  Diabetic - insulin  . FOOT SURGERY Right    lesion excision    Prior to Admission medications   Medication Sig Start Date End Date Taking? Authorizing Provider  amLODipine (NORVASC) 10 MG tablet Take 1 tablet by mouth daily. 05/02/16   [provider]  ASPIRIN 81 PO Take by mouth daily.    [provider]  atorvastatin (LIPITOR) 40 MG tablet Take 1 tablet by mouth daily. 05/02/16   [provider]  CALCIUM PO Take by mouth daily.    [provider]  carvedilol (COREG) 25 MG tablet Take 1 tablet by mouth 2 (two) times daily. 05/02/16   [provider]   Cholecalciferol (VITAMIN D3 PO) Take by mouth daily.    [provider]  donepezil (ARICEPT) 10 MG tablet Take 10 mg by mouth daily. 04/21/18   [provider]  insulin aspart protamine- aspart (NOVOLOG MIX 70/30) (70-30) 100 UNIT/ML injection Inject 15 Units into the skin daily with breakfast.    [provider]  lidocaine (LIDODERM) 5 % Place 1 patch onto the skin every 12 (twelve) hours. Remove & Discard patch within 12 hours or as directed by MD 01/28/19 01/28/20  Blake Divine, MD  LUTEIN PO Take by mouth daily.    [provider]  quinapril (ACCUPRIL) 40 MG tablet Take 1 tablet by mouth 2 (two) times daily. 05/02/16   [provider]  SYNTHROID 50 MCG tablet Take 1 tablet by mouth daily. 04/21/18   [provider]  TRESIBA FLEXTOUCH 100 UNIT/ML SOPN FlexTouch Pen Inject 60 Units into the skin at bedtime.  04/21/18   [provider]    No Known Allergies  Family History  Problem Relation Age of Onset  . Breast cancer Neg Hx     Social History Social History   Tobacco Use  . Smoking status: Never Smoker  . Smokeless tobacco: Never Used  Substance Use Topics  . Alcohol use: No  . Drug use: Not on file    Review of Systems Constitutional: Negative for fever. Cardiovascular: Negative for chest pain. Respiratory: Mild shortness of breath especially with exertion.  Gastrointestinal: Negative for abdominal pain Musculoskeletal: No lower extremity edema. Skin: Negative for skin complaints  Neurological: Negative for headache All other ROS negative  ____________________________________________   PHYSICAL EXAM:  VITAL SIGNS: ED Triage Vitals  Enc Vitals Group     BP 05/13/19 1257 (!) 152/75     Pulse Rate 05/13/19 1257 (!) 57     Resp 05/13/19 1257 20     Temp 05/13/19 1257 97.7 F (36.5 C)     Temp Source 05/13/19 1257 Oral     SpO2 05/13/19 1257 96 %     Weight 05/13/19 1331 210 lb (95.3 kg)     Height  05/13/19 1331 5\' 7"  (1.702 m)     Head Circumference --      Peak Flow --      Pain Score 05/13/19 1330 0     Pain Loc --      Pain Edu? --      Excl. in [place]? --     Constitutional: Alert and oriented. Well appearing and in no distress. Eyes: Normal exam ENT      Head: Normocephalic and atraumatic.      Mouth/Throat: Mucous membranes are moist. Cardiovascular: Normal rate, regular rhythm.  Respiratory: Normal respiratory effort without tachypnea nor retractions. Breath sounds are clear Gastrointestinal: Soft and nontender. No distention.  Musculoskeletal: Nontender with normal range of motion in all extremities. No lower extremity tenderness or edema. Neurologic:  Normal speech and language. No gross focal neurologic deficits Skin:  Skin is warm, dry and intact.  Psychiatric: Mood and affect are normal.   ____________________________________________    EKG  EKG viewed and interpreted by myself shows a sinus bradycardia 50 bpm with a narrow QRS, normal axis, normal intervals, no concerning ST changes.  ____________________________________________    RADIOLOGY  Chest x-ray shows cardiomegaly with venous congestion suggestive of mild CHF.  ____________________________________________   INITIAL IMPRESSION / ASSESSMENT AND PLAN / ED COURSE  Pertinent labs & imaging results that were available during my care of the patient were reviewed by me and considered in my medical decision making (see chart for details).   Patient presents to the emergency department for shortness of breath especially with exertion ongoing for the past 3 weeks.  Currently the patient appears well denies any shortness of breath while lying in bed.  Currently satting between 97 and 98% on room air.  With a normal respiratory effort, clear lung sounds on exam.  Patient is afebrile.  Patient's chest x-ray is most suggestive of mild CHF.  Patient denies any history of CHF previously.  No diuretics on record  review.  Patient is anemic but appears to be fairly baseline for her anemia.  Patient has chronic kidney disease and again fairly baseline for the patient.  We will obtain a troponin, we will dose IV Lasix.  Of the patient's troponin is negative I would anticipate discharge home with CHF clinic follow-up on oral Lasix.  Patient agreeable to plan of care.  Patient also does not currently see a cardiologist, I will refer to cardiology as well.  Patient's troponin is negative.  Patient is diuresing after Lasix.  We will discharge her with oral Lasix and have the patient follow-up with the CHF clinic.  Patient agreeable to plan of care.  Discussed return precautions.  [name] [name] was evaluated in Emergency Department on 05/13/2019 for the symptoms described in the history of present illness. She was evaluated in the context of the [place] COVID-19  pandemic, which necessitated consideration that the patient might be at risk for infection with the SARS-CoV-2 virus that causes COVID-19. Institutional protocols and algorithms that pertain to the evaluation of patients at risk for COVID-19 are in a state of rapid change based on information released by regulatory bodies including the CDC and federal and state organizations. These policies and algorithms were followed during the patient's care in the ED.  ____________________________________________   FINAL CLINICAL IMPRESSION(S) / ED DIAGNOSES  Congestive heart failure   Harvest Dark, MD 05/13/19 2030

## 2019-05-13 NOTE — ED Notes (Signed)
This RN rounded on patient in lobby. VS obtained. This RN apologized and explained wait to patient. Pt states understanding.

## 2019-05-13 NOTE — ED Notes (Addendum)
Pt with inspiratory wheezing, MD notified, duoneb given

## 2019-05-13 NOTE — ED Triage Notes (Signed)
Patient presents to the ED with increased shortness of breath x 2 weeks.  Patient reports difficulty walking to the restroom.  Patient states she has also noticed increased urinary frequency.  Patient is a diabetic but blood sugar was 64 this am.  Patient is alert and oriented x 4.

## 2019-05-13 NOTE — Discharge Instructions (Signed)
Please call the number provided for cardiology to arrange a follow-up appointment.  Please call the number provided for the CHF clinic tomorrow morning, they should be contacting you as well to arrange a follow-up appointment.  Please take your Lasix 20 mg each morning for the next 2 weeks.  Return to the emergency department for any worsening shortness of breath any chest pain or any other symptom personally concerning to yourself.

## 2019-05-13 NOTE — ED Notes (Signed)
Pt may eat and drink per EDP. Pt given meal tray, tolerating well.

## 2019-05-18 ENCOUNTER — Encounter: Payer: Self-pay | Admitting: Family

## 2019-05-18 ENCOUNTER — Other Ambulatory Visit: Payer: Self-pay

## 2019-05-18 ENCOUNTER — Ambulatory Visit: Payer: Medicare HMO | Attending: Family | Admitting: Family

## 2019-05-18 VITALS — BP 145/58 | HR 70 | Resp 18 | Ht 67.0 in | Wt 218.0 lb

## 2019-05-18 DIAGNOSIS — E785 Hyperlipidemia, unspecified: Secondary | ICD-10-CM | POA: Insufficient documentation

## 2019-05-18 DIAGNOSIS — Z7982 Long term (current) use of aspirin: Secondary | ICD-10-CM | POA: Insufficient documentation

## 2019-05-18 DIAGNOSIS — I110 Hypertensive heart disease with heart failure: Secondary | ICD-10-CM | POA: Diagnosis not present

## 2019-05-18 DIAGNOSIS — Z79899 Other long term (current) drug therapy: Secondary | ICD-10-CM | POA: Insufficient documentation

## 2019-05-18 DIAGNOSIS — Z9842 Cataract extraction status, left eye: Secondary | ICD-10-CM | POA: Diagnosis not present

## 2019-05-18 DIAGNOSIS — I10 Essential (primary) hypertension: Secondary | ICD-10-CM

## 2019-05-18 DIAGNOSIS — Z794 Long term (current) use of insulin: Secondary | ICD-10-CM | POA: Diagnosis not present

## 2019-05-18 DIAGNOSIS — I5032 Chronic diastolic (congestive) heart failure: Secondary | ICD-10-CM | POA: Diagnosis present

## 2019-05-18 DIAGNOSIS — E039 Hypothyroidism, unspecified: Secondary | ICD-10-CM | POA: Insufficient documentation

## 2019-05-18 DIAGNOSIS — E1022 Type 1 diabetes mellitus with diabetic chronic kidney disease: Secondary | ICD-10-CM

## 2019-05-18 DIAGNOSIS — Z961 Presence of intraocular lens: Secondary | ICD-10-CM | POA: Insufficient documentation

## 2019-05-18 DIAGNOSIS — E119 Type 2 diabetes mellitus without complications: Secondary | ICD-10-CM | POA: Insufficient documentation

## 2019-05-18 DIAGNOSIS — I890 Lymphedema, not elsewhere classified: Secondary | ICD-10-CM | POA: Insufficient documentation

## 2019-05-18 DIAGNOSIS — Z79890 Hormone replacement therapy (postmenopausal): Secondary | ICD-10-CM | POA: Diagnosis not present

## 2019-05-18 LAB — GLUCOSE, CAPILLARY: Glucose-Capillary: 118 mg/dL — ABNORMAL HIGH (ref 70–99)

## 2019-05-18 NOTE — Progress Notes (Signed)
Patient ID: [name] [name], female    DOB: 1945/04/10, 74 y.o.   MRN: [id number]  HPI  [name] [name] is a 74 y/o female with a history of DM, hyperlipidemia, HTN, thyroid disease, vertigo and chronic heart failure.   Echo report from 06/28/15 reviewed and showed an EF of 55% along with mild MR and moderate TR.   Was in the ED 05/13/19 due to new onset heart failure. IV lasix given and she was released.   She presents today for her initial visit with a chief complaint of moderate fatigue upon minimal exertion. She describes this as chronic in nature having been present for several years. She has associated shortness of breath, intermittent chest pain, pedal edema, palpitations, chronic difficulty sleeping and rhinorrhea along with this. She denies any dizziness, abdominal distention, cough or change in appetite.  Does not weigh herself as she doesn't have any scales. Admits that she likes salt but does use sea salt. Currently has a sore on her right foot so has a brace on the right foot.   Past Medical History:  Diagnosis Date  . CHF (congestive heart failure) ([place])   . Diabetes mellitus without complication ([place])   . Humerus fracture 04/20/2018   left  . Hyperlipidemia   . Hypertension   . Hypothyroidism   . Vertigo    several yrs ago  . Wears dentures    partial upper and lower (loose)   Past Surgical History:  Procedure Laterality Date  . CATARACT EXTRACTION W/PHACO Left 12/01/2018   Procedure: CATARACT EXTRACTION PHACO AND INTRAOCULAR LENS PLACEMENT ([place]) LEFT DIABETIC;  Surgeon: Birder Robson, MD;  Location: [place];  Service: Ophthalmology;  Laterality: Left;  Diabetic - insulin  . FOOT SURGERY Right    lesion excision   Family History  Problem Relation Age of Onset  . Breast cancer Neg Hx    Social History   Tobacco Use  . Smoking status: Never Smoker  . Smokeless tobacco: Never Used  Substance Use Topics  . Alcohol use: No   No Known Allergies Prior to  Admission medications   Medication Sig Start Date End Date Taking? Authorizing Provider  amLODipine (NORVASC) 10 MG tablet Take 1 tablet by mouth daily. 05/02/16  Yes [provider]  ASPIRIN 81 PO Take by mouth daily.   Yes [provider]  atorvastatin (LIPITOR) 40 MG tablet Take 1 tablet by mouth daily. 05/02/16  Yes [provider]  CALCIUM PO Take by mouth daily.   Yes [provider]  carvedilol (COREG) 25 MG tablet Take 1 tablet by mouth 2 (two) times daily. 05/02/16  Yes [provider]  donepezil (ARICEPT) 10 MG tablet Take 10 mg by mouth daily. 04/21/18  Yes [provider]  furosemide (LASIX) 20 MG tablet Take 1 tablet (20 mg total) by mouth daily. 05/13/19 05/12/20 Yes Paduchowski, Lennette Bihari, MD  insulin aspart protamine- aspart (NOVOLOG MIX 70/30) (70-30) 100 UNIT/ML injection Inject 15 Units into the skin daily with breakfast.   Yes [provider]  quinapril (ACCUPRIL) 40 MG tablet Take 1 tablet by mouth 2 (two) times daily. 05/02/16  Yes [provider]  SYNTHROID 50 MCG tablet Take 1 tablet by mouth daily. 04/21/18  Yes [provider]  TRESIBA FLEXTOUCH 100 UNIT/ML SOPN FlexTouch Pen Inject 60 Units into the skin at bedtime.  04/21/18  Yes [provider]     Review of Systems  Constitutional: Positive for fatigue (easily). Negative for appetite  change.  HENT: Positive for rhinorrhea. Negative for congestion and sore throat.   Eyes: Negative.   Respiratory: Positive for shortness of breath. Negative for cough.   Cardiovascular: Positive for chest pain, palpitations and leg swelling.  Gastrointestinal: Negative for abdominal distention and abdominal pain.  Endocrine: Negative.   Genitourinary: Negative.   Musculoskeletal: Positive for neck pain. Negative for back pain.  Skin: Positive for wound.       Sore on right foot  Allergic/Immunologic: Negative.   Neurological: Negative for dizziness and  light-headedness.  Hematological: Negative for adenopathy. Does not bruise/bleed easily.  Psychiatric/Behavioral: Positive for sleep disturbance (sleeping on 1 pillow). Negative for dysphoric mood. The patient is not nervous/anxious.    Vitals:   05/18/19 1347  BP: (!) 145/58  Pulse: 70  Resp: 18  SpO2: 96%  Weight: 218 lb (98.9 kg)  Height: 5\' 7"  (1.702 m)   Wt Readings from Last 3 Encounters:  05/18/19 218 lb (98.9 kg)  05/13/19 210 lb (95.3 kg)  01/28/19 202 lb (91.6 kg)   Lab Results  Component Value Date   CREATININE 2.05 (H) 05/13/2019   CREATININE 1.59 (H) 01/28/2019   CREATININE 2.23 (H) 04/27/2018     Physical Exam Vitals and nursing note reviewed.  Constitutional:      Appearance: She is well-developed.  HENT:     Head: Normocephalic and atraumatic.  Neck:     Vascular: No JVD.  Cardiovascular:     Rate and Rhythm: Normal rate and regular rhythm.  Pulmonary:     Effort: Pulmonary effort is normal. No respiratory distress.     Breath sounds: No wheezing or rales.  Abdominal:     Palpations: Abdomen is soft.     Tenderness: There is no abdominal tenderness.  Musculoskeletal:     Cervical back: Normal range of motion and neck supple.     Right lower leg: No tenderness. Edema (1+ pitting) present.     Left lower leg: No tenderness. Edema (trace pitting) present.  Skin:    General: Skin is warm.  Neurological:     General: No focal deficit present.     Mental Status: She is alert and oriented to person, place, and time.  Psychiatric:        Mood and Affect: Mood normal.        Behavior: Behavior normal.     Assessment & Plan:  1: Chronic heart failure with preserved ejection fraction- - NYHA class III - euvolemic today - not weighing daily; set of scales given to patient and she was instructed to call for an overnight weight gain of >2 pounds or a weekly weight gain of >5 pounds - does add sea salt and admits that she likes salt; reviewed the  importance of not adding salt and to use Mrs Deliah Boston so as to keep daily sodium intake to 2000mg  / day; written dietary information and a low sodium cookbook were given to patient - last saw cardiology ([facility]) 01/05/2019 & returns March 2020 - echo was scheduled for 06/03/19 - patient reports receiving her flu vaccine for this season  2: HTN- - BP mildly elevated today - follows with PCP Lennox Grumbles)  & returns in 2 days - BMP 05/13/19 reviewed and showed sodium 139, potassium 4.7, creatinine 2.05 and GFR 27  3: DM- - nonfasting glucose in clinic today was 118 - follows with endocrinology [facility]) - has seen nephrology at [facility] nephrology  4: Lymphedema- - stage 2 - not elevating her  legs and she was encouraged to elevate her legs when sitting for long periods of time - consider compression socks if podiatry ok's it because of wound - not able to exercise much due to wound on her foot - consider lymphapress compression boots if edema persists  Medication bubble packs were reviewed.   Return in 2 months or sooner for any questions/ problems before then.

## 2019-05-18 NOTE — Patient Instructions (Addendum)
Begin weighing daily and call for an overnight weight gain of > 2 pounds or a weekly weight gain of >5 pounds.   Low-Sodium Eating Plan Sodium, which is an element that makes up salt, helps you maintain a healthy balance of fluids in your body. Too much sodium can increase your blood pressure and cause fluid and waste to be held in your body. Your health care provider or dietitian may recommend following this plan if you have high blood pressure (hypertension), kidney disease, liver disease, or heart failure. Eating less sodium can help lower your blood pressure, reduce swelling, and protect your heart, liver, and kidneys. What are tips for following this plan? General guidelines  Most people on this plan should limit their sodium intake to 2,000 mg (milligrams) of sodium each day. Reading food labels   The Nutrition Facts label lists the amount of sodium in one serving of the food. If you eat more than one serving, you must multiply the listed amount of sodium by the number of servings.  Choose foods with less than 140 mg of sodium per serving.  Avoid foods with 300 mg of sodium or more per serving. Shopping  Look for lower-sodium products, often labeled as "low-sodium" or "no salt added."  Always check the sodium content even if foods are labeled as "unsalted" or "no salt added".  Buy fresh foods. ? Avoid canned foods and premade or frozen meals. ? Avoid canned, cured, or processed meats  Buy breads that have less than 80 mg of sodium per slice. Cooking  Eat more home-cooked food and less restaurant, buffet, and fast food.  Avoid adding salt when cooking. Use salt-free seasonings or herbs instead of table salt or sea salt. Check with your health care provider or pharmacist before using salt substitutes.  Cook with plant-based oils, such as canola, sunflower, or olive oil. Meal planning  When eating at a restaurant, ask that your food be prepared with less salt or no salt, if  possible.  Avoid foods that contain MSG (monosodium glutamate). MSG is sometimes added to Chinese food, bouillon, and some canned foods. What foods are recommended? The items listed may not be a complete list. Talk with your dietitian about what dietary choices are best for you. Grains Low-sodium cereals, including oats, puffed wheat and rice, and shredded wheat. Low-sodium crackers. Unsalted rice. Unsalted pasta. Low-sodium bread. Whole-grain breads and whole-grain pasta. Vegetables Fresh or frozen vegetables. "No salt added" canned vegetables. "No salt added" tomato sauce and paste. Low-sodium or reduced-sodium tomato and vegetable juice. Fruits Fresh, frozen, or canned fruit. Fruit juice. Meats and other protein foods Fresh or frozen (no salt added) meat, poultry, seafood, and fish. Low-sodium canned tuna and salmon. Unsalted nuts. Dried peas, beans, and lentils without added salt. Unsalted canned beans. Eggs. Unsalted nut butters. Dairy Milk. Soy milk. Cheese that is naturally low in sodium, such as ricotta cheese, fresh mozzarella, or Swiss cheese Low-sodium or reduced-sodium cheese. Cream cheese. Yogurt. Fats and oils Unsalted butter. Unsalted margarine with no trans fat. Vegetable oils such as canola or olive oils. Seasonings and other foods Fresh and dried herbs and spices. Salt-free seasonings. Low-sodium mustard and ketchup. Sodium-free salad dressing. Sodium-free light mayonnaise. Fresh or refrigerated horseradish. Lemon juice. Vinegar. Homemade, reduced-sodium, or low-sodium soups. Unsalted popcorn and pretzels. Low-salt or salt-free chips. What foods are not recommended? The items listed may not be a complete list. Talk with your dietitian about what dietary choices are best for you. Grains Instant hot   hot cereals. Bread stuffing, pancake, and biscuit mixes. Croutons. Seasoned rice or pasta mixes. Noodle soup cups. Boxed or frozen macaroni and cheese. Regular salted crackers.  Self-rising flour. Vegetables Sauerkraut, pickled vegetables, and relishes. Olives. [place] fries. Onion rings. Regular canned vegetables (not low-sodium or reduced-sodium). Regular canned tomato sauce and paste (not low-sodium or reduced-sodium). Regular tomato and vegetable juice (not low-sodium or reduced-sodium). Frozen vegetables in sauces. Meats and other protein foods Meat or fish that is salted, canned, smoked, spiced, or pickled. Bacon, ham, sausage, hotdogs, corned beef, chipped beef, packaged lunch meats, salt pork, jerky, pickled herring, anchovies, regular canned tuna, sardines, salted nuts. Dairy Processed cheese and cheese spreads. Cheese curds. Blue cheese. Feta cheese. String cheese. Regular cottage cheese. Buttermilk. Canned milk. Fats and oils Salted butter. Regular margarine. Ghee. Bacon fat. Seasonings and other foods Onion salt, garlic salt, seasoned salt, table salt, and sea salt. Canned and packaged gravies. Worcestershire sauce. Tartar sauce. Barbecue sauce. Teriyaki sauce. Soy sauce, including reduced-sodium. Steak sauce. Fish sauce. Oyster sauce. Cocktail sauce. Horseradish that you find on the shelf. Regular ketchup and mustard. Meat flavorings and tenderizers. Bouillon cubes. Hot sauce and Tabasco sauce. Premade or packaged marinades. Premade or packaged taco seasonings. Relishes. Regular salad dressings. Salsa. Potato and tortilla chips. Corn chips and puffs. Salted popcorn and pretzels. Canned or dried soups. Pizza. Frozen entrees and pot pies. Summary  Eating less sodium can help lower your blood pressure, reduce swelling, and protect your heart, liver, and kidneys.  Most people on this plan should limit their sodium intake to 1,500-2,000 mg (milligrams) of sodium each day.  Canned, boxed, and frozen foods are high in sodium. Restaurant foods, fast foods, and pizza are also very high in sodium. You also get sodium by adding salt to food.  Try to cook at home, eat  more fresh fruits and vegetables, and eat less fast food, canned, processed, or prepared foods. This information is not intended to replace advice given to you by your health care provider. Make sure you discuss any questions you have with your health care provider. Document Revised: 03/07/2017 Document Reviewed: 03/18/2016 Elsevier Patient Education  2020 [employer].

## 2019-06-03 ENCOUNTER — Ambulatory Visit
Admission: RE | Admit: 2019-06-03 | Discharge: 2019-06-03 | Disposition: A | Discharge status: Home / Self Care | Payer: Medicare HMO | Source: Ambulatory Visit | Attending: Family | Admitting: Family

## 2019-06-03 ENCOUNTER — Other Ambulatory Visit: Payer: Self-pay

## 2019-06-03 DIAGNOSIS — I110 Hypertensive heart disease with heart failure: Secondary | ICD-10-CM | POA: Insufficient documentation

## 2019-06-03 DIAGNOSIS — I081 Rheumatic disorders of both mitral and tricuspid valves: Secondary | ICD-10-CM | POA: Insufficient documentation

## 2019-06-03 DIAGNOSIS — I5032 Chronic diastolic (congestive) heart failure: Secondary | ICD-10-CM | POA: Insufficient documentation

## 2019-06-03 DIAGNOSIS — E119 Type 2 diabetes mellitus without complications: Secondary | ICD-10-CM | POA: Diagnosis not present

## 2019-06-03 NOTE — Progress Notes (Signed)
*  PRELIMINARY RESULTS* Echocardiogram 2D Echocardiogram has been performed.  Sherrie Sport 06/03/2019, 11:49 AM

## 2019-06-21 ENCOUNTER — Encounter: Payer: Self-pay | Admitting: Emergency Medicine

## 2019-06-21 ENCOUNTER — Other Ambulatory Visit: Payer: Self-pay

## 2019-06-21 ENCOUNTER — Emergency Department
Admission: EM | Admit: 2019-06-21 | Discharge: 2019-06-21 | Disposition: A | Discharge status: Home / Self Care | Payer: Medicare HMO | Attending: Emergency Medicine | Admitting: Emergency Medicine

## 2019-06-21 DIAGNOSIS — Z5321 Procedure and treatment not carried out due to patient leaving prior to being seen by health care provider: Secondary | ICD-10-CM | POA: Insufficient documentation

## 2019-06-21 DIAGNOSIS — R1030 Lower abdominal pain, unspecified: Secondary | ICD-10-CM | POA: Diagnosis present

## 2019-06-21 LAB — COMPREHENSIVE METABOLIC PANEL
ALT: 20 U/L (ref 0–44)
AST: 23 U/L (ref 15–41)
Albumin: 4.2 g/dL (ref 3.5–5.0)
Alkaline Phosphatase: 104 U/L (ref 38–126)
Anion gap: 6 (ref 5–15)
BUN: 48 mg/dL — ABNORMAL HIGH (ref 8–23)
CO2: 23 mmol/L (ref 22–32)
Calcium: 9 mg/dL (ref 8.9–10.3)
Chloride: 112 mmol/L — ABNORMAL HIGH (ref 98–111)
Creatinine, Ser: 2.31 mg/dL — ABNORMAL HIGH (ref 0.44–1.00)
GFR calc Af Amer: 24 mL/min — ABNORMAL LOW (ref >60)
GFR calc non Af Amer: 20 mL/min — ABNORMAL LOW (ref >60)
Glucose, Bld: 186 mg/dL — ABNORMAL HIGH (ref 70–99)
Potassium: 5.5 mmol/L — ABNORMAL HIGH (ref 3.5–5.1)
Sodium: 141 mmol/L (ref 135–145)
Total Bilirubin: 0.5 mg/dL (ref 0.3–1.2)
Total Protein: 8.2 g/dL — ABNORMAL HIGH (ref 6.5–8.1)

## 2019-06-21 LAB — CBC
HCT: 32.5 % — ABNORMAL LOW (ref 36.0–46.0)
Hemoglobin: 10.4 g/dL — ABNORMAL LOW (ref 12.0–15.0)
MCH: 29.8 pg (ref 26.0–34.0)
MCHC: 32 g/dL (ref 30.0–36.0)
MCV: 93.1 fL (ref 80.0–100.0)
Platelets: 194 10*3/uL (ref 150–400)
RBC: 3.49 MIL/uL — ABNORMAL LOW (ref 3.87–5.11)
RDW: 13.7 % (ref 11.5–15.5)
WBC: 8.5 10*3/uL (ref 4.0–10.5)
nRBC: 0 % (ref 0.0–0.2)

## 2019-06-21 LAB — LIPASE, BLOOD: Lipase: 56 U/L — ABNORMAL HIGH (ref 11–51)

## 2019-06-21 NOTE — ED Triage Notes (Signed)
Pt here for lower abdominal pain.  Reports BP has been up and down but not sure if her bp cuff at home is right; has wrist one.  "my urine is also green".  No NVD. No fevers. VSS. Unlabored.

## 2019-06-22 ENCOUNTER — Telehealth: Payer: Self-pay | Admitting: Emergency Medicine

## 2019-06-22 NOTE — Telephone Encounter (Signed)
Called patient due to lwot to inquire about condition and follow up plans. Left message.   

## 2019-06-30 DIAGNOSIS — I5032 Chronic diastolic (congestive) heart failure: Secondary | ICD-10-CM

## 2019-06-30 HISTORY — DX: Chronic diastolic (congestive) heart failure: I50.32

## 2019-07-05 ENCOUNTER — Other Ambulatory Visit: Payer: Self-pay

## 2019-07-05 ENCOUNTER — Other Ambulatory Visit
Admission: RE | Admit: 2019-07-05 | Discharge: 2019-07-05 | Disposition: A | Discharge status: Home / Self Care | Payer: Medicare HMO | Source: Ambulatory Visit | Attending: General Surgery | Admitting: General Surgery

## 2019-07-05 DIAGNOSIS — Z01812 Encounter for preprocedural laboratory examination: Secondary | ICD-10-CM | POA: Diagnosis present

## 2019-07-05 DIAGNOSIS — Z20822 Contact with and (suspected) exposure to covid-19: Secondary | ICD-10-CM | POA: Insufficient documentation

## 2019-07-05 LAB — SARS CORONAVIRUS 2 (TAT 6-24 HRS): SARS Coronavirus 2: NEGATIVE

## 2019-07-06 ENCOUNTER — Encounter: Payer: Self-pay | Admitting: General Surgery

## 2019-07-07 ENCOUNTER — Ambulatory Visit: Payer: Medicare HMO | Admitting: Anesthesiology

## 2019-07-07 ENCOUNTER — Encounter
Admission: RE | Disposition: A | Discharge status: Home / Self Care | Payer: Self-pay | Source: Ambulatory Visit | Attending: General Surgery

## 2019-07-07 ENCOUNTER — Ambulatory Visit
Admission: RE | Admit: 2019-07-07 | Discharge: 2019-07-07 | Disposition: A | Discharge status: Home / Self Care | Payer: Medicare HMO | Source: Ambulatory Visit | Attending: General Surgery | Admitting: General Surgery

## 2019-07-07 ENCOUNTER — Encounter: Payer: Self-pay | Admitting: General Surgery

## 2019-07-07 ENCOUNTER — Other Ambulatory Visit: Payer: Self-pay

## 2019-07-07 DIAGNOSIS — Z7982 Long term (current) use of aspirin: Secondary | ICD-10-CM | POA: Diagnosis not present

## 2019-07-07 DIAGNOSIS — K449 Diaphragmatic hernia without obstruction or gangrene: Secondary | ICD-10-CM | POA: Insufficient documentation

## 2019-07-07 DIAGNOSIS — I509 Heart failure, unspecified: Secondary | ICD-10-CM | POA: Insufficient documentation

## 2019-07-07 DIAGNOSIS — E119 Type 2 diabetes mellitus without complications: Secondary | ICD-10-CM | POA: Diagnosis not present

## 2019-07-07 DIAGNOSIS — E039 Hypothyroidism, unspecified: Secondary | ICD-10-CM | POA: Insufficient documentation

## 2019-07-07 DIAGNOSIS — Z79899 Other long term (current) drug therapy: Secondary | ICD-10-CM | POA: Diagnosis not present

## 2019-07-07 DIAGNOSIS — E669 Obesity, unspecified: Secondary | ICD-10-CM | POA: Insufficient documentation

## 2019-07-07 DIAGNOSIS — R195 Other fecal abnormalities: Secondary | ICD-10-CM | POA: Insufficient documentation

## 2019-07-07 DIAGNOSIS — K2950 Unspecified chronic gastritis without bleeding: Secondary | ICD-10-CM | POA: Diagnosis not present

## 2019-07-07 DIAGNOSIS — Z79890 Hormone replacement therapy (postmenopausal): Secondary | ICD-10-CM | POA: Diagnosis not present

## 2019-07-07 DIAGNOSIS — Z794 Long term (current) use of insulin: Secondary | ICD-10-CM | POA: Insufficient documentation

## 2019-07-07 DIAGNOSIS — K317 Polyp of stomach and duodenum: Secondary | ICD-10-CM | POA: Diagnosis not present

## 2019-07-07 DIAGNOSIS — I110 Hypertensive heart disease with heart failure: Secondary | ICD-10-CM | POA: Diagnosis not present

## 2019-07-07 DIAGNOSIS — E785 Hyperlipidemia, unspecified: Secondary | ICD-10-CM | POA: Insufficient documentation

## 2019-07-07 DIAGNOSIS — D124 Benign neoplasm of descending colon: Secondary | ICD-10-CM | POA: Insufficient documentation

## 2019-07-07 DIAGNOSIS — Z6831 Body mass index (BMI) 31.0-31.9, adult: Secondary | ICD-10-CM | POA: Insufficient documentation

## 2019-07-07 HISTORY — PX: ESOPHAGOGASTRODUODENOSCOPY (EGD) WITH PROPOFOL: SHX5813

## 2019-07-07 HISTORY — PX: COLONOSCOPY WITH PROPOFOL: SHX5780

## 2019-07-07 LAB — GLUCOSE, CAPILLARY
Glucose-Capillary: 91 mg/dL (ref 70–99)
Glucose-Capillary: 90 mg/dL (ref 70–99)

## 2019-07-07 SURGERY — COLONOSCOPY WITH PROPOFOL
Anesthesia: General

## 2019-07-07 MED ORDER — PROPOFOL 10 MG/ML IV BOLUS
INTRAVENOUS | Status: DC | PRN
  Administered 2019-07-07: 60 mg via INTRAVENOUS

## 2019-07-07 MED ORDER — SODIUM CHLORIDE 0.9 % IV SOLN: INTRAVENOUS | Status: DC

## 2019-07-07 MED ORDER — PROPOFOL 500 MG/50ML IV EMUL
INTRAVENOUS | Status: AC
  Filled 2019-07-07: qty 50

## 2019-07-07 MED ORDER — PROPOFOL 500 MG/50ML IV EMUL
INTRAVENOUS | Status: DC | PRN
  Administered 2019-07-07: 120 ug/kg/min via INTRAVENOUS

## 2019-07-07 MED ORDER — PROPOFOL 10 MG/ML IV BOLUS
INTRAVENOUS | Status: AC
  Filled 2019-07-07: qty 20

## 2019-07-07 MED ORDER — LIDOCAINE HCL (CARDIAC) PF 100 MG/5ML IV SOSY
PREFILLED_SYRINGE | INTRAVENOUS | Status: DC | PRN
  Administered 2019-07-07: 80 mg via INTRAVENOUS

## 2019-07-07 MED ORDER — PHENYLEPHRINE HCL (PRESSORS) 10 MG/ML IV SOLN
INTRAVENOUS | Status: DC | PRN
  Administered 2019-07-07: 100 ug via INTRAVENOUS

## 2019-07-07 NOTE — Op Note (Signed)
[facility] Patient Name: [name] [name] Procedure Date: 07/07/2019 9:09 AM MRN: [id number] Account #: [id number] Date of Birth: 1945-05-24 Admit Type: Outpatient Age: 74 Room: [facility] ENDO ROOM 1 Gender: Female Note Status: Finalized Procedure:             Colonoscopy Indications:           Heme positive stool Providers:             Robert Bellow, MD Medicines:             Monitored Anesthesia Care Complications:         No immediate complications. Procedure:             Pre-Anesthesia Assessment:                        - Prior to the procedure, a History and Physical was                         performed, and patient medications, allergies and                         sensitivities were reviewed. The patient's tolerance                         of previous anesthesia was reviewed.                        - The risks and benefits of the procedure and the                         sedation options and risks were discussed with the                         patient. All questions were answered and informed                         consent was obtained.                        After obtaining informed consent, the colonoscope was                         passed under direct vision. Throughout the procedure,                         the patient's blood pressure, pulse, and oxygen                         saturations were monitored continuously. The                         Colonoscope was introduced through the anus and                         advanced to the the cecum, identified by appendiceal                         orifice and ileocecal valve. The colonoscopy was  somewhat difficult due to significant looping and a                         tortuous colon. Successful completion of the procedure                         was aided by using manual pressure. The patient                         tolerated the procedure well. The quality of the  bowel                         preparation was excellent. Findings:      A 12 mm polyp was found in the proximal descending colon. The polyp was       semi-pedunculated. The polyp was removed with a hot snare. Resection and       retrieval were complete.      The retroflexed view of the distal rectum and anal verge was normal and       showed no anal or rectal abnormalities. Impression:            - One 12 mm polyp in the proximal descending colon,                         removed with a hot snare. Resected and retrieved.                        - The distal rectum and anal verge are normal on                         retroflexion view. Recommendation:        - Telephone endoscopist for pathology results in 1                         week. Procedure Code(s):     --- Professional ---                        [phone], Colonoscopy, flexible; with removal of                         tumor(s), polyp(s), or other lesion(s) by snare                         technique Diagnosis Code(s):     --- Professional ---                        K63.5, Polyp of colon                        R19.5, Other fecal abnormalities CPT copyright 2019 American Medical Association. All rights reserved. The codes documented in this report are preliminary and upon coder review may  be revised to meet current compliance requirements. Robert Bellow, MD 07/07/2019 10:15:14 AM This report has been signed electronically. Number of Addenda: 0 Note Initiated On: 07/07/2019 9:09 AM Scope Withdrawal Time: 0 hours 12 minutes 37 seconds  Total Procedure Duration: 0 hours 28 minutes 46 seconds  Estimated Blood Loss:  Estimated blood loss: none.  [facility]

## 2019-07-07 NOTE — H&P (Signed)
[name] [name] [id number] [birth year]     HPI:  74 year old woman with recently noted heme positive stools. History of chronic anemia.  For upper and lower endoscopy. Tolerated prep well.   Medications Prior to Admission  Medication Sig Dispense Refill Last Dose  . amLODipine (NORVASC) 10 MG tablet Take 1 tablet by mouth daily.   Past Week at Unknown time  . ASPIRIN 81 PO Take by mouth daily.   07/06/2019 at Unknown time  . atorvastatin (LIPITOR) 40 MG tablet Take 1 tablet by mouth daily.   Past Week at Unknown time  . CALCIUM PO Take by mouth daily.   Past Week at Unknown time  . carvedilol (COREG) 25 MG tablet Take 1 tablet by mouth 2 (two) times daily.   07/06/2019 at Unknown time  . donepezil (ARICEPT) 10 MG tablet Take 10 mg by mouth daily.   07/06/2019 at Unknown time  . insulin aspart protamine- aspart (NOVOLOG MIX 70/30) (70-30) 100 UNIT/ML injection Inject 15 Units into the skin daily with breakfast.   Past Week at Unknown time  . quinapril (ACCUPRIL) 40 MG tablet Take 1 tablet by mouth 2 (two) times daily.   Past Week at Unknown time  . SYNTHROID 50 MCG tablet Take 1 tablet by mouth daily.   Past Week at Unknown time  . TRESIBA FLEXTOUCH 100 UNIT/ML SOPN FlexTouch Pen Inject 60 Units into the skin at bedtime.    Past Week at Unknown time  . furosemide (LASIX) 20 MG tablet Take 1 tablet (20 mg total) by mouth daily. (Patient not taking: Reported on 07/07/2019) 14 tablet 0 Not Taking at Unknown time   No Known Allergies Past Medical History:  Diagnosis Date  . CHF (congestive heart failure) ([place])   . Diabetes mellitus without complication ([place])   . Humerus fracture 04/20/2018   left  . Hyperlipidemia   . Hypertension   . Hypothyroidism   . Vertigo    several yrs ago  . Wears dentures    partial upper and lower (loose)   Past Surgical History:  Procedure Laterality Date  . CATARACT EXTRACTION W/PHACO Left 12/01/2018   Procedure: CATARACT EXTRACTION PHACO AND INTRAOCULAR LENS  PLACEMENT ([place]) LEFT DIABETIC;  Surgeon: Birder Robson, MD;  Location: [place];  Service: Ophthalmology;  Laterality: Left;  Diabetic - insulin  . FOOT SURGERY Right    lesion excision   Social History   Socioeconomic History  . Marital status: Divorced    Spouse name: Not on file  . Number of children: Not on file  . Years of education: Not on file  . Highest education level: Not on file  Occupational History  . Not on file  Tobacco Use  . Smoking status: Never Smoker  . Smokeless tobacco: Never Used  Substance and Sexual Activity  . Alcohol use: No  . Drug use: Never  . Sexual activity: Not on file  Other Topics Concern  . Not on file  Social History Narrative  . Not on file   Social Determinants of Health   Financial Resource Strain:   . Difficulty of Paying Living Expenses:   Food Insecurity:   . Worried About Charity fundraiser in the Last Year:   . Arboriculturist in the Last Year:   Transportation Needs:   . Film/video editor (Medical):   Marland Kitchen Lack of Transportation (Non-Medical):   Physical Activity:   . Days of Exercise per Week:   .  Minutes of Exercise per Session:   Stress:   . Feeling of Stress :   Social Connections:   . Frequency of Communication with Friends and Family:   . Frequency of Social Gatherings with Friends and Family:   . Attends Religious Services:   . Active Member of Clubs or Organizations:   . Attends Archivist Meetings:   Marland Kitchen Marital Status:   Intimate Partner Violence:   . Fear of Current or Ex-Partner:   . Emotionally Abused:   Marland Kitchen Physically Abused:   . Sexually Abused:    Social History   Social History Narrative  . Not on file     ROS: Negative.     PE: HEENT: Negative. Lungs: Clear. Cardio: RR.  Assessment/Plan:  Proceed with planned endoscopy. [facility] 07/07/2019

## 2019-07-07 NOTE — Op Note (Signed)
[facility] Patient Name: [name] [name] Procedure Date: 07/07/2019 9:11 AM MRN: [id number] Account #: [id number] Date of Birth: [birth year] Admit Type: Outpatient Age: 74 Room: [facility] ENDO ROOM 1 Gender: Female Note Status: Finalized Procedure:             Upper GI endoscopy Indications:           Heme positive stool Providers:             Robert Bellow, MD Medicines:             Monitored Anesthesia Care Complications:         No immediate complications. Procedure:             Pre-Anesthesia Assessment:                        - Prior to the procedure, a History and Physical was                         performed, and patient medications, allergies and                         sensitivities were reviewed. The patient's tolerance                         of previous anesthesia was reviewed.                        - The risks and benefits of the procedure and the                         sedation options and risks were discussed with the                         patient. All questions were answered and informed                         consent was obtained.                        After obtaining informed consent, the endoscope was                         passed under direct vision. Throughout the procedure,                         the patient's blood pressure, pulse, and oxygen                         saturations were monitored continuously. The Endoscope                         was introduced through the mouth, and advanced to the                         fourth part of duodenum. The upper GI endoscopy was                         accomplished without difficulty. The patient tolerated  the procedure well. Findings:      A small hiatal hernia was present.      A single 7 mm sessile polyp with no bleeding and no stigmata of recent       bleeding was found on the greater curvature of the stomach. Biopsies       were taken with a cold  forceps for histology.      A medium-sized, submucosal, non-circumferential mass with no bleeding       and no stigmata of recent bleeding was found in the prepyloric region of       the stomach. Biopsies were taken with a cold forceps for histology.      A single 4 mm sessile polyp with no bleeding was found in the second       portion of the duodenum. Biopsies were taken with a cold forceps for       histology. Impression:            - Small hiatal hernia.                        - A single gastric polyp. Biopsied.                        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 Biopsied.                        - A single duodenal polyp. Biopsied. Recommendation:        - Perform a colonoscopy today. Procedure Code(s):     --- Professional ---                        [phone], Esophagogastroduodenoscopy, flexible,                         transoral; with biopsy, single or multiple Diagnosis Code(s):     --- Professional ---                        K44.9, Diaphragmatic hernia without obstruction or                         gangrene                        K31.7, Polyp of stomach and duodenum                        D13.1, Benign neoplasm of stomach                        R19.5, Other fecal abnormalities CPT copyright 2019 American Medical Association. All rights reserved. The codes documented in this report are preliminary and upon coder review may  be revised to meet current compliance requirements. Robert Bellow, MD 07/07/2019 9:42:58 AM This report has been signed electronically. Number of Addenda: 0 Note Initiated On: 07/07/2019 9:11 AM Estimated Blood Loss:  Estimated blood loss: none.      [facility]

## 2019-07-07 NOTE — Transfer of Care (Signed)
Immediate Anesthesia Transfer of Care Note  Patient: [name] [name]  Procedure(s) Performed: COLONOSCOPY WITH PROPOFOL (N/A ) ESOPHAGOGASTRODUODENOSCOPY (EGD) WITH PROPOFOL (N/A )  Patient Location: PACU  Anesthesia Type:General  Level of Consciousness: sedated  Airway & Oxygen Therapy: Patient Spontanous Breathing and Patient connected to nasal cannula oxygen  Post-op Assessment: Report given to RN and Post -op Vital signs reviewed and stable  Post vital signs: Reviewed and stable  Last Vitals:  Vitals Value Taken Time  BP 111/65 07/07/19 1018  Temp 36.3 C 07/07/19 1018  Pulse 55 07/07/19 1024  Resp 15 07/07/19 1024  SpO2 95 % 07/07/19 1024  Vitals shown include unvalidated device data.  Last Pain:  Vitals:   07/07/19 1018  TempSrc:   PainSc: Asleep         Complications: No apparent anesthesia complications

## 2019-07-07 NOTE — Anesthesia Preprocedure Evaluation (Addendum)
Anesthesia Evaluation  Patient identified by MRN, date of birth, ID band Patient awake    Reviewed: Allergy & Precautions, H&P , NPO status , Patient's Chart, lab work & pertinent test results  Airway Mallampati: III  TM Distance: >3 FB     Dental  (+) Chipped, Missing   Pulmonary neg pulmonary ROS,    breath sounds clear to auscultation       Cardiovascular hypertension, +CHF (moderately elevated PA pressures on echo, normal RV function.  EF 51%, diastolic parameters indeterminate)   Rhythm:regular Rate:Normal     Neuro/Psych negative neurological ROS  negative psych ROS   GI/Hepatic negative GI ROS, Neg liver ROS,   Endo/Other  diabetesHypothyroidism   Renal/GU negative Renal ROS  negative genitourinary   Musculoskeletal   Abdominal   Peds  Hematology negative hematology ROS (+)   Anesthesia Other Findings obese  Past Medical History: No date: CHF (congestive heart failure) ([facility]) No date: Diabetes mellitus without complication ([place]) 02/58/5277: Humerus fracture     Comment:  left No date: Hyperlipidemia No date: Hypertension No date: Hypothyroidism No date: Vertigo     Comment:  several yrs ago No date: Wears dentures     Comment:  partial upper and lower (loose)  Past Surgical History: 12/01/2018: CATARACT EXTRACTION W/PHACO; Left     Comment:  Procedure: CATARACT EXTRACTION PHACO AND INTRAOCULAR               LENS PLACEMENT ([place]) LEFT DIABETIC;  Surgeon: Birder Robson, MD;  Location: [place];  Service:               Ophthalmology;  Laterality: Left;  Diabetic - insulin No date: FOOT SURGERY; Right     Comment:  lesion excision     Reproductive/Obstetrics negative OB ROS                            Anesthesia Physical Anesthesia Plan  ASA: III  Anesthesia Plan: General   Post-op Pain Management:    Induction:   PONV Risk Score and  Plan: Propofol infusion and TIVA  Airway Management Planned:   Additional Equipment:   Intra-op Plan:   Post-operative Plan:   Informed Consent: I have reviewed the patients History and Physical, chart, labs and discussed the procedure including the risks, benefits and alternatives for the proposed anesthesia with the patient or authorized representative who has indicated his/her understanding and acceptance.     Dental Advisory Given  Plan Discussed with: Anesthesiologist  Anesthesia Plan Comments:        Anesthesia Quick Evaluation

## 2019-07-08 ENCOUNTER — Encounter: Payer: Self-pay | Admitting: *Deleted

## 2019-07-08 NOTE — Anesthesia Postprocedure Evaluation (Signed)
Anesthesia Post Note  Patient: [name] [name]  Procedure(s) Performed: COLONOSCOPY WITH PROPOFOL (N/A ) ESOPHAGOGASTRODUODENOSCOPY (EGD) WITH PROPOFOL (N/A )  Patient location during evaluation: PACU Anesthesia Type: General Level of consciousness: awake and alert Pain management: pain level controlled Vital Signs Assessment: post-procedure vital signs reviewed and stable Respiratory status: spontaneous breathing, nonlabored ventilation and respiratory function stable Cardiovascular status: blood pressure returned to baseline and stable Postop Assessment: no apparent nausea or vomiting Anesthetic complications: no     Last Vitals:  Vitals:   07/07/19 1038 07/07/19 1048  BP: (!) 147/74 (!) 151/71  Pulse: 62 (!) 52  Resp: 17 14  Temp:    SpO2: 98% 100%    Last Pain:  Vitals:   07/08/19 0753  TempSrc:   PainSc: 0-No pain                 Tera Mater

## 2019-07-09 LAB — SURGICAL PATHOLOGY

## 2019-07-17 NOTE — Progress Notes (Signed)
Patient ID: [name] [name], female    DOB: 21-Feb-1946, 74 y.o.   MRN: [id number]  HPI  Ms [name] [name] is a 74 y/o female with a history of DM, hyperlipidemia, HTN, thyroid disease, vertigo and chronic heart failure.   Echo report from 06/03/19 reviewed and showed an EF of 60-65% along with mild MR. Echo report from 06/28/15 reviewed and showed an EF of 55% along with mild MR and moderate TR.   Went to the ED on 06/21/19 but left without being seen (GI). Was in the ED 05/13/19 due to new onset heart failure. IV lasix given and she was released.   She presents today for a follow-up visit with a chief complaint of moderate fatigue upon minimal exertion. She describes this as chronic in nature having been present for several years. She has associated shortness of breath, chest pain and difficulty sleeping along with this. She denies any dizziness, abdominal distention, palpitations, pedal edema, cough or weight gain.   Son that is with her says that her furosemide has been stopped.   Past Medical History:  Diagnosis Date  . CHF (congestive heart failure) ([place])   . Diabetes mellitus without complication ([place])   . Humerus fracture 04/20/2018   left  . Hyperlipidemia   . Hypertension   . Hypothyroidism   . Vertigo    several yrs ago  . Wears dentures    partial upper and lower (loose)   Past Surgical History:  Procedure Laterality Date  . CATARACT EXTRACTION W/PHACO Left 12/01/2018   Procedure: CATARACT EXTRACTION PHACO AND INTRAOCULAR LENS PLACEMENT ([place]) LEFT DIABETIC;  Surgeon: Birder Robson, MD;  Location: [facility];  Service: Ophthalmology;  Laterality: Left;  Diabetic - insulin  . COLONOSCOPY WITH PROPOFOL N/A 07/07/2019   Procedure: COLONOSCOPY WITH PROPOFOL;  Surgeon: Robert Bellow, MD;  Location: [facility];  Service: Endoscopy;  Laterality: N/A;  . ESOPHAGOGASTRODUODENOSCOPY (EGD) WITH PROPOFOL N/A 07/07/2019   Procedure: ESOPHAGOGASTRODUODENOSCOPY (EGD) WITH PROPOFOL;   Surgeon: Robert Bellow, MD;  Location: [facility];  Service: Endoscopy;  Laterality: N/A;  . FOOT SURGERY Right    lesion excision   Family History  Problem Relation Age of Onset  . Breast cancer Neg Hx    Social History   Tobacco Use  . Smoking status: Never Smoker  . Smokeless tobacco: Never Used  Substance Use Topics  . Alcohol use: No   No Known Allergies  Prior to Admission medications   Medication Sig Start Date End Date Taking? Authorizing Provider  amLODipine (NORVASC) 10 MG tablet Take 1 tablet by mouth daily. 05/02/16  Yes [provider]  ASPIRIN 81 PO Take by mouth daily.   Yes [provider]  atorvastatin (LIPITOR) 40 MG tablet Take 1 tablet by mouth daily. 05/02/16  Yes [provider]  CALCIUM PO Take by mouth daily.   Yes [provider]  carvedilol (COREG) 25 MG tablet Take 1 tablet by mouth 2 (two) times daily. 05/02/16  Yes [provider]  donepezil (ARICEPT) 10 MG tablet Take 10 mg by mouth daily. 04/21/18  Yes [provider]  insulin aspart protamine- aspart (NOVOLOG MIX 70/30) (70-30) 100 UNIT/ML injection Inject 15 Units into the skin daily with breakfast.   Yes [provider]  quinapril (ACCUPRIL) 40 MG tablet Take 1 tablet by mouth 2 (two) times daily. 05/02/16  Yes [provider]  SYNTHROID 50 MCG tablet Take 1 tablet by mouth daily. 04/21/18  Yes [provider]  TRESIBA FLEXTOUCH 100 UNIT/ML SOPN FlexTouch Pen Inject 60 Units into the skin at bedtime.  04/21/18  Yes [provider]  furosemide (LASIX) 20 MG tablet Take 1 tablet (20 mg total) by mouth daily. Patient not taking: Reported on 07/07/2019 05/13/19 05/12/20  Harvest Dark, MD     Review of Systems  Constitutional: Positive for fatigue (easily). Negative for appetite change.  HENT: Positive for rhinorrhea. Negative for congestion and sore throat.   Eyes: Negative.   Respiratory: Positive for  shortness of breath. Negative for cough.   Cardiovascular: Positive for chest pain. Negative for palpitations and leg swelling.  Gastrointestinal: Negative for abdominal distention and abdominal pain.  Endocrine: Negative.   Genitourinary: Negative.   Musculoskeletal: Positive for neck pain. Negative for back pain.  Skin: Positive for wound.       Sore on right foot  Allergic/Immunologic: Negative.   Neurological: Negative for dizziness and light-headedness.  Hematological: Negative for adenopathy. Does not bruise/bleed easily.  Psychiatric/Behavioral: Positive for sleep disturbance (sleeping on 1 pillow). Negative for dysphoric mood. The patient is not nervous/anxious.    Vitals:   07/19/19 1328  BP: (!) 151/68  Pulse: 60  Resp: 18  SpO2: 99%  Weight: 209 lb 2 oz (94.9 kg)  Height: 5\' 7"  (1.702 m)   Wt Readings from Last 3 Encounters:  07/19/19 209 lb 2 oz (94.9 kg)  07/07/19 203 lb (92.1 kg)  06/21/19 202 lb (91.6 kg)   Lab Results  Component Value Date   CREATININE 2.31 (H) 06/21/2019   CREATININE 2.05 (H) 05/13/2019   CREATININE 1.59 (H) 01/28/2019     Physical Exam Vitals and nursing note reviewed.  Constitutional:      Appearance: She is well-developed.  HENT:     Head: Normocephalic and atraumatic.  Neck:     Vascular: No JVD.  Cardiovascular:     Rate and Rhythm: Normal rate and regular rhythm.  Pulmonary:     Effort: Pulmonary effort is normal. No respiratory distress.     Breath sounds: No wheezing or rales.  Abdominal:     Palpations: Abdomen is soft.     Tenderness: There is no abdominal tenderness.  Musculoskeletal:     Cervical back: Normal range of motion and neck supple.     Right lower leg: No tenderness. Edema (1+ pitting) present.     Left lower leg: No tenderness. Edema (trace pitting) present.  Skin:    General: Skin is warm.  Neurological:     General: No focal deficit present.     Mental Status: She is alert and oriented to person,  place, and time.  Psychiatric:        Mood and Affect: Mood normal.        Behavior: Behavior normal.     Assessment & Plan:  1: Chronic heart failure with preserved ejection fraction without structural changes- - NYHA class III - euvolemic today - weighing daily; reminded to call for an overnight weight gain of >2 pounds or a weekly weight gain of >5 pounds - weight down 9 pounds from last visit here 2 months ago - does add sea salt and admits that she likes salt; reviewed the importance of not adding salt  - last saw cardiology ([facility]) 06/30/19 - patient reports receiving her flu vaccine for this season  2: HTN- - BP mildly elevated - follows closely with PCP Lennox Grumbles)   - BMP 06/21/19 reviewed and showed sodium 141, potassium 5.5, creatinine  2.31 and GFR 24  3: DM- - nonfasting glucose in clinic today was 147 - follows with [facility]ocrinology [facility]) - has [facility]rology at [facility] nephrology  4: Lymphedema- - stage 2 - not elevating her legs and she was encouraged to elevate her legs when sitting for long periods of time - consider compression socks if podiatry ok's it because of wound - not able to exercise much due to wound on her foot    Patient did not bring her medications nor a list. Each medication was verbally reviewed with the patient and she was encouraged to bring the bottles to every visit to confirm accuracy of list.  Due to HF stability, will not make a return appointment at this time. Advised patient and her son that they could call back at anytime to make another appointment and they were comfortable with this plan.

## 2019-07-19 ENCOUNTER — Ambulatory Visit: Payer: Medicare HMO | Attending: Family | Admitting: Family

## 2019-07-19 ENCOUNTER — Other Ambulatory Visit: Payer: Self-pay

## 2019-07-19 ENCOUNTER — Encounter: Payer: Self-pay | Admitting: Family

## 2019-07-19 VITALS — BP 151/68 | HR 60 | Resp 18 | Ht 67.0 in | Wt 209.1 lb

## 2019-07-19 DIAGNOSIS — E1022 Type 1 diabetes mellitus with diabetic chronic kidney disease: Secondary | ICD-10-CM

## 2019-07-19 DIAGNOSIS — I10 Essential (primary) hypertension: Secondary | ICD-10-CM

## 2019-07-19 DIAGNOSIS — Z9842 Cataract extraction status, left eye: Secondary | ICD-10-CM | POA: Insufficient documentation

## 2019-07-19 DIAGNOSIS — Z961 Presence of intraocular lens: Secondary | ICD-10-CM | POA: Diagnosis not present

## 2019-07-19 DIAGNOSIS — Z79899 Other long term (current) drug therapy: Secondary | ICD-10-CM | POA: Insufficient documentation

## 2019-07-19 DIAGNOSIS — I5032 Chronic diastolic (congestive) heart failure: Secondary | ICD-10-CM | POA: Insufficient documentation

## 2019-07-19 DIAGNOSIS — N184 Chronic kidney disease, stage 4 (severe): Secondary | ICD-10-CM

## 2019-07-19 DIAGNOSIS — E785 Hyperlipidemia, unspecified: Secondary | ICD-10-CM | POA: Diagnosis not present

## 2019-07-19 DIAGNOSIS — Z7982 Long term (current) use of aspirin: Secondary | ICD-10-CM | POA: Diagnosis not present

## 2019-07-19 DIAGNOSIS — E119 Type 2 diabetes mellitus without complications: Secondary | ICD-10-CM | POA: Insufficient documentation

## 2019-07-19 DIAGNOSIS — E039 Hypothyroidism, unspecified: Secondary | ICD-10-CM | POA: Insufficient documentation

## 2019-07-19 DIAGNOSIS — Z79890 Hormone replacement therapy (postmenopausal): Secondary | ICD-10-CM | POA: Diagnosis not present

## 2019-07-19 DIAGNOSIS — I890 Lymphedema, not elsewhere classified: Secondary | ICD-10-CM | POA: Insufficient documentation

## 2019-07-19 DIAGNOSIS — I110 Hypertensive heart disease with heart failure: Secondary | ICD-10-CM | POA: Diagnosis not present

## 2019-07-19 DIAGNOSIS — Z794 Long term (current) use of insulin: Secondary | ICD-10-CM | POA: Insufficient documentation

## 2019-07-19 LAB — GLUCOSE, CAPILLARY: Glucose-Capillary: 147 mg/dL — ABNORMAL HIGH (ref 70–99)

## 2019-07-19 NOTE — Patient Instructions (Addendum)
Resume weighing daily and call for an overnight weight gain of > 2 pounds or a weekly weight gain of >5 pounds.   Call us in the future if you'd like to make another appointment

## 2019-07-20 ENCOUNTER — Encounter: Payer: Self-pay | Admitting: Family

## 2019-10-03 ENCOUNTER — Emergency Department: Payer: Medicare Other

## 2019-10-03 ENCOUNTER — Encounter: Payer: Self-pay | Admitting: Radiology

## 2019-10-03 ENCOUNTER — Inpatient Hospital Stay
Admission: EM | Admit: 2019-10-03 | Discharge: 2019-10-07 | DRG: 683 | Disposition: A | Discharge status: Home Health | Payer: Medicare Other | Attending: Internal Medicine | Admitting: Internal Medicine

## 2019-10-03 DIAGNOSIS — L03115 Cellulitis of right lower limb: Secondary | ICD-10-CM | POA: Diagnosis present

## 2019-10-03 DIAGNOSIS — Y92009 Unspecified place in unspecified non-institutional (private) residence as the place of occurrence of the external cause: Secondary | ICD-10-CM

## 2019-10-03 DIAGNOSIS — N179 Acute kidney failure, unspecified: Principal | ICD-10-CM

## 2019-10-03 DIAGNOSIS — N183 Chronic kidney disease, stage 3 unspecified: Secondary | ICD-10-CM

## 2019-10-03 DIAGNOSIS — R0781 Pleurodynia: Secondary | ICD-10-CM

## 2019-10-03 DIAGNOSIS — Z7982 Long term (current) use of aspirin: Secondary | ICD-10-CM

## 2019-10-03 DIAGNOSIS — N184 Chronic kidney disease, stage 4 (severe): Secondary | ICD-10-CM

## 2019-10-03 DIAGNOSIS — R0789 Other chest pain: Secondary | ICD-10-CM | POA: Diagnosis present

## 2019-10-03 DIAGNOSIS — I10 Essential (primary) hypertension: Secondary | ICD-10-CM

## 2019-10-03 DIAGNOSIS — E039 Hypothyroidism, unspecified: Secondary | ICD-10-CM

## 2019-10-03 DIAGNOSIS — R1011 Right upper quadrant pain: Secondary | ICD-10-CM

## 2019-10-03 DIAGNOSIS — E875 Hyperkalemia: Secondary | ICD-10-CM

## 2019-10-03 DIAGNOSIS — E669 Obesity, unspecified: Secondary | ICD-10-CM | POA: Diagnosis present

## 2019-10-03 DIAGNOSIS — M25551 Pain in right hip: Secondary | ICD-10-CM | POA: Diagnosis not present

## 2019-10-03 DIAGNOSIS — Z6833 Body mass index (BMI) 33.0-33.9, adult: Secondary | ICD-10-CM

## 2019-10-03 DIAGNOSIS — L97519 Non-pressure chronic ulcer of other part of right foot with unspecified severity: Secondary | ICD-10-CM

## 2019-10-03 DIAGNOSIS — N189 Chronic kidney disease, unspecified: Secondary | ICD-10-CM

## 2019-10-03 DIAGNOSIS — R410 Disorientation, unspecified: Secondary | ICD-10-CM | POA: Diagnosis present

## 2019-10-03 DIAGNOSIS — N390 Urinary tract infection, site not specified: Secondary | ICD-10-CM

## 2019-10-03 DIAGNOSIS — E1122 Type 2 diabetes mellitus with diabetic chronic kidney disease: Secondary | ICD-10-CM

## 2019-10-03 DIAGNOSIS — Z79899 Other long term (current) drug therapy: Secondary | ICD-10-CM

## 2019-10-03 DIAGNOSIS — E11610 Type 2 diabetes mellitus with diabetic neuropathic arthropathy: Secondary | ICD-10-CM | POA: Diagnosis present

## 2019-10-03 DIAGNOSIS — D72829 Elevated white blood cell count, unspecified: Secondary | ICD-10-CM

## 2019-10-03 DIAGNOSIS — Z794 Long term (current) use of insulin: Secondary | ICD-10-CM

## 2019-10-03 DIAGNOSIS — E785 Hyperlipidemia, unspecified: Secondary | ICD-10-CM

## 2019-10-03 DIAGNOSIS — E11621 Type 2 diabetes mellitus with foot ulcer: Secondary | ICD-10-CM

## 2019-10-03 DIAGNOSIS — M79604 Pain in right leg: Secondary | ICD-10-CM

## 2019-10-03 DIAGNOSIS — L97512 Non-pressure chronic ulcer of other part of right foot with fat layer exposed: Secondary | ICD-10-CM | POA: Diagnosis present

## 2019-10-03 DIAGNOSIS — R52 Pain, unspecified: Secondary | ICD-10-CM

## 2019-10-03 DIAGNOSIS — Z20822 Contact with and (suspected) exposure to covid-19: Secondary | ICD-10-CM | POA: Diagnosis present

## 2019-10-03 DIAGNOSIS — W19XXXA Unspecified fall, initial encounter: Secondary | ICD-10-CM

## 2019-10-03 DIAGNOSIS — Z7952 Long term (current) use of systemic steroids: Secondary | ICD-10-CM

## 2019-10-03 DIAGNOSIS — D631 Anemia in chronic kidney disease: Secondary | ICD-10-CM | POA: Diagnosis present

## 2019-10-03 DIAGNOSIS — I129 Hypertensive chronic kidney disease with stage 1 through stage 4 chronic kidney disease, or unspecified chronic kidney disease: Secondary | ICD-10-CM | POA: Diagnosis present

## 2019-10-03 DIAGNOSIS — E1140 Type 2 diabetes mellitus with diabetic neuropathy, unspecified: Secondary | ICD-10-CM | POA: Diagnosis present

## 2019-10-03 DIAGNOSIS — K760 Fatty (change of) liver, not elsewhere classified: Secondary | ICD-10-CM | POA: Diagnosis present

## 2019-10-03 LAB — URINALYSIS, COMPLETE (UACMP) WITH MICROSCOPIC
Bilirubin Urine: NEGATIVE
Glucose, UA: NEGATIVE mg/dL
Hgb urine dipstick: NEGATIVE
Ketones, ur: NEGATIVE mg/dL
Leukocytes,Ua: NEGATIVE
Nitrite: NEGATIVE
Protein, ur: 100 mg/dL — AB
Specific Gravity, Urine: 1.014 (ref 1.005–1.030)
pH: 5 (ref 5.0–8.0)

## 2019-10-03 LAB — COMPREHENSIVE METABOLIC PANEL
ALT: 16 U/L (ref 0–44)
AST: 24 U/L (ref 15–41)
Albumin: 3.7 g/dL (ref 3.5–5.0)
Alkaline Phosphatase: 96 U/L (ref 38–126)
Anion gap: 12 (ref 5–15)
BUN: 35 mg/dL — ABNORMAL HIGH (ref 8–23)
CO2: 17 mmol/L — ABNORMAL LOW (ref 22–32)
Calcium: 9.1 mg/dL (ref 8.9–10.3)
Chloride: 111 mmol/L (ref 98–111)
Creatinine, Ser: 2.26 mg/dL — ABNORMAL HIGH (ref 0.44–1.00)
GFR calc Af Amer: 24 mL/min — ABNORMAL LOW (ref >60)
GFR calc non Af Amer: 21 mL/min — ABNORMAL LOW (ref >60)
Glucose, Bld: 157 mg/dL — ABNORMAL HIGH (ref 70–99)
Potassium: 4.7 mmol/L (ref 3.5–5.1)
Sodium: 140 mmol/L (ref 135–145)
Total Bilirubin: 0.9 mg/dL (ref 0.3–1.2)
Total Protein: 8 g/dL (ref 6.5–8.1)

## 2019-10-03 LAB — CBC WITH DIFFERENTIAL/PLATELET
Abs Immature Granulocytes: 0.15 10*3/uL — ABNORMAL HIGH (ref 0.00–0.07)
Basophils Absolute: 0.1 10*3/uL (ref 0.0–0.1)
Basophils Relative: 0 %
Eosinophils Absolute: 0 10*3/uL (ref 0.0–0.5)
Eosinophils Relative: 0 %
HCT: 34.5 % — ABNORMAL LOW (ref 36.0–46.0)
Hemoglobin: 11.9 g/dL — ABNORMAL LOW (ref 12.0–15.0)
Immature Granulocytes: 1 %
Lymphocytes Relative: 9 %
Lymphs Abs: 1.9 10*3/uL (ref 0.7–4.0)
MCH: 30.7 pg (ref 26.0–34.0)
MCHC: 34.5 g/dL (ref 30.0–36.0)
MCV: 88.9 fL (ref 80.0–100.0)
Monocytes Absolute: 1.3 10*3/uL — ABNORMAL HIGH (ref 0.1–1.0)
Monocytes Relative: 6 %
Neutro Abs: 18.5 10*3/uL — ABNORMAL HIGH (ref 1.7–7.7)
Neutrophils Relative %: 84 %
Platelets: 204 10*3/uL (ref 150–400)
RBC: 3.88 MIL/uL (ref 3.87–5.11)
RDW: 14.4 % (ref 11.5–15.5)
WBC: 21.9 10*3/uL — ABNORMAL HIGH (ref 4.0–10.5)
nRBC: 0 % (ref 0.0–0.2)

## 2019-10-03 LAB — LACTIC ACID, PLASMA: Lactic Acid, Venous: 1.1 mmol/L (ref 0.5–1.9)

## 2019-10-03 MED ORDER — PIPERACILLIN-TAZOBACTAM 3.375 G IVPB 30 MIN
3.3750 g | Freq: Once | INTRAVENOUS | Status: AC
  Administered 2019-10-03: 3.375 g via INTRAVENOUS
  Filled 2019-10-03: qty 50

## 2019-10-03 MED ORDER — VANCOMYCIN HCL IN DEXTROSE 1-5 GM/200ML-% IV SOLN
1000.0000 mg | Freq: Once | INTRAVENOUS | Status: AC
  Administered 2019-10-03: 1000 mg via INTRAVENOUS
  Filled 2019-10-03: qty 200

## 2019-10-03 MED ORDER — MORPHINE SULFATE (PF) 4 MG/ML IV SOLN
4.0000 mg | Freq: Once | INTRAVENOUS | Status: AC
  Administered 2019-10-03: 4 mg via INTRAVENOUS
  Filled 2019-10-03: qty 1

## 2019-10-03 MED ORDER — ONDANSETRON HCL 4 MG/2ML IJ SOLN
4.0000 mg | Freq: Once | INTRAMUSCULAR | Status: AC
  Administered 2019-10-03: 4 mg via INTRAVENOUS
  Filled 2019-10-03: qty 2

## 2019-10-03 NOTE — ED Notes (Signed)
Pt cleaned of incontinent episode, wet brief removed. Clean bed pad placed, external urinary cath placed by this RN. Pt comfortable, son bedside.

## 2019-10-03 NOTE — ED Triage Notes (Signed)
PT BIBA c/o rt hip pain after mechanical fall from bed this afternoon.  GCS 15, PEARL, airway patent, rr even/unlabored, abd soft/round/nontender.  Skin warm/pink/dry, +PMSC x4, VSS,  Placed on bedside monitor.  Dr. Jimmye Norman at bedside for assessment.

## 2019-10-03 NOTE — ED Provider Notes (Signed)
ER Provider Note       Time seen: 4:47 PM    I have reviewed the vital signs and the nursing notes.  HISTORY   Chief Complaint Hip Pain    HPI [name] [name] is a 74 y.o. female with a history of CHF, diabetes, humerus fracture, hyperlipidemia, hypertension, hypothyroidism who presents today for right hip pain after a fall from her bed this afternoon.  She denies any complaints other than right hip pain.  Pain is 5 out of 10 in the right hip.  Past Medical History:  Diagnosis Date  . CHF (congestive heart failure) ([place])   . Diabetes mellitus without complication ([place])   . Humerus fracture 04/20/2018   left  . Hyperlipidemia   . Hypertension   . Hypothyroidism   . Vertigo    several yrs ago  . Wears dentures    partial upper and lower (loose)    Past Surgical History:  Procedure Laterality Date  . CATARACT EXTRACTION W/PHACO Left 12/01/2018   Procedure: CATARACT EXTRACTION PHACO AND INTRAOCULAR LENS PLACEMENT ([place]) LEFT DIABETIC;  Surgeon: Birder Robson, MD;  Location: [place];  Service: Ophthalmology;  Laterality: Left;  Diabetic - insulin  . COLONOSCOPY WITH PROPOFOL N/A 07/07/2019   Procedure: COLONOSCOPY WITH PROPOFOL;  Surgeon: Robert Bellow, MD;  Location: [facility];  Service: Endoscopy;  Laterality: N/A;  . ESOPHAGOGASTRODUODENOSCOPY (EGD) WITH PROPOFOL N/A 07/07/2019   Procedure: ESOPHAGOGASTRODUODENOSCOPY (EGD) WITH PROPOFOL;  Surgeon: Robert Bellow, MD;  Location: [facility];  Service: Endoscopy;  Laterality: N/A;  . FOOT SURGERY Right    lesion excision    Allergies Patient has no known allergies.   Review of Systems Constitutional: Negative for fever. Cardiovascular: Negative for chest pain. Respiratory: Negative for shortness of breath. Gastrointestinal: Negative for abdominal pain, vomiting and diarrhea. Musculoskeletal: Positive for right hip pain Skin: Negative for rash. Neurological: Negative for headaches,  focal weakness or numbness.  All systems negative/normal/unremarkable except as stated in the HPI  ____________________________________________   PHYSICAL EXAM:  VITAL SIGNS: Vitals:   10/03/19 1642  BP: (!) 156/74  Pulse: 86  Resp: 18  Temp: 97.7 F (36.5 C)  SpO2: 97%    Constitutional: Well appearing and in no distress. Eyes: Conjunctivae are normal. Normal extraocular movements. ENT      Head: Normocephalic and atraumatic.      Nose: No congestion/rhinnorhea.      Mouth/Throat: Mucous membranes are moist.      Neck: No stridor. Cardiovascular: Normal rate, regular rhythm. No murmurs, rubs, or gallops. Respiratory: Normal respiratory effort without tachypnea nor retractions. Breath sounds are clear and equal bilaterally. No wheezes/rales/rhonchi. Gastrointestinal: Soft and nontender. Normal bowel sounds Musculoskeletal: Pain with range of motion of the right hip Neurologic:  Normal speech and language. No gross focal neurologic deficits are appreciated.  Skin: Wound over the medial aspect of the right foot and ankle with foul smell and some erythema Psychiatric: Speech and behavior are normal.  ____________________________________________  EKG: Interpreted by me.  Sinus rhythm with rate of 85 bpm, normal PR interval, normal QRS, normal QT  ____________________________________________   LABS (pertinent positives/negatives)  Labs Reviewed  CBC WITH DIFFERENTIAL/PLATELET - Abnormal; Notable for the following components:      Result Value   WBC 21.9 (*)    Hemoglobin 11.9 (*)    HCT 34.5 (*)    Neutro Abs 18.5 (*)    Monocytes Absolute 1.3 (*)    Abs Immature Granulocytes 0.15 (*)  All other components within normal limits  URINALYSIS, COMPLETE (UACMP) WITH MICROSCOPIC - Abnormal; Notable for the following components:   Color, Urine YELLOW (*)    APPearance CLOUDY (*)    Protein, ur 100 (*)    Bacteria, UA RARE (*)    All other components within normal limits   COMPREHENSIVE METABOLIC PANEL - Abnormal; Notable for the following components:   CO2 17 (*)    Glucose, Bld 157 (*)    BUN 35 (*)    Creatinine, Ser 2.26 (*)    GFR calc non Af Amer 21 (*)    GFR calc Af Amer 24 (*)    All other components within normal limits  CULTURE, BLOOD (ROUTINE X 2)  CULTURE, BLOOD (ROUTINE X 2)  URINE CULTURE  LACTIC ACID, PLASMA  SEDIMENTATION RATE    RADIOLOGY  Images were viewed by me Right hip x-rays Chest x-ray is unremarkable, CT pelvis is unremarkable  DIFFERENTIAL DIAGNOSIS  Fall, contusion, fracture, dislocation, dehydration, electrolyte abnormality, occult infection  ASSESSMENT AND PLAN  Fall, right hip pain, right foot ulceration with cellulitis   Plan: The patient had presented for a fall with right hip pain. Patient's labs revealed a surprising leukocytosis.  Right foot wound is likely the cause of the leukocytosis, I have ordered broad-spectrum antibiotics.  Initially we were thinking she may have a UTI or hip fracture but this did not appear to be the case.  I will discuss with hospitalist for admission.  Lenise Arena MD    Note: This note was generated in part or whole with voice recognition software. Voice recognition is usually quite accurate but there are transcription errors that can and very often do occur. I apologize for any typographical errors that were not detected and corrected.     Earleen Newport, MD 10/03/19 [phone]

## 2019-10-03 NOTE — ED Notes (Addendum)
Pt unable to urinate, this RN and Ambulance person in and out cathed pt per EDP order. 16mL yellow urine obtained. Specimen labeled and sent to lab.  Pt's peri area cleaned, external catheter replaced and secured.

## 2019-10-04 ENCOUNTER — Observation Stay: Payer: Medicare Other

## 2019-10-04 ENCOUNTER — Other Ambulatory Visit: Payer: Self-pay

## 2019-10-04 DIAGNOSIS — R1011 Right upper quadrant pain: Secondary | ICD-10-CM

## 2019-10-04 DIAGNOSIS — N179 Acute kidney failure, unspecified: Secondary | ICD-10-CM | POA: Diagnosis not present

## 2019-10-04 DIAGNOSIS — E11621 Type 2 diabetes mellitus with foot ulcer: Secondary | ICD-10-CM | POA: Diagnosis not present

## 2019-10-04 DIAGNOSIS — L97412 Non-pressure chronic ulcer of right heel and midfoot with fat layer exposed: Secondary | ICD-10-CM

## 2019-10-04 DIAGNOSIS — E785 Hyperlipidemia, unspecified: Secondary | ICD-10-CM

## 2019-10-04 DIAGNOSIS — L03115 Cellulitis of right lower limb: Secondary | ICD-10-CM | POA: Diagnosis not present

## 2019-10-04 DIAGNOSIS — M79604 Pain in right leg: Secondary | ICD-10-CM | POA: Diagnosis not present

## 2019-10-04 DIAGNOSIS — E1122 Type 2 diabetes mellitus with diabetic chronic kidney disease: Secondary | ICD-10-CM

## 2019-10-04 DIAGNOSIS — N184 Chronic kidney disease, stage 4 (severe): Secondary | ICD-10-CM

## 2019-10-04 DIAGNOSIS — W19XXXA Unspecified fall, initial encounter: Secondary | ICD-10-CM

## 2019-10-04 DIAGNOSIS — N189 Chronic kidney disease, unspecified: Secondary | ICD-10-CM

## 2019-10-04 DIAGNOSIS — D72829 Elevated white blood cell count, unspecified: Secondary | ICD-10-CM

## 2019-10-04 DIAGNOSIS — E039 Hypothyroidism, unspecified: Secondary | ICD-10-CM

## 2019-10-04 LAB — GLUCOSE, CAPILLARY
Glucose-Capillary: 195 mg/dL — ABNORMAL HIGH (ref 70–99)
Glucose-Capillary: 165 mg/dL — ABNORMAL HIGH (ref 70–99)
Glucose-Capillary: 98 mg/dL (ref 70–99)
Glucose-Capillary: 162 mg/dL — ABNORMAL HIGH (ref 70–99)

## 2019-10-04 LAB — CBC
HCT: 32.7 % — ABNORMAL LOW (ref 36.0–46.0)
Hemoglobin: 10.7 g/dL — ABNORMAL LOW (ref 12.0–15.0)
MCH: 30.3 pg (ref 26.0–34.0)
MCHC: 32.7 g/dL (ref 30.0–36.0)
MCV: 92.6 fL (ref 80.0–100.0)
Platelets: 187 10*3/uL (ref 150–400)
RBC: 3.53 MIL/uL — ABNORMAL LOW (ref 3.87–5.11)
RDW: 14.6 % (ref 11.5–15.5)
WBC: 19 10*3/uL — ABNORMAL HIGH (ref 4.0–10.5)
nRBC: 0 % (ref 0.0–0.2)

## 2019-10-04 LAB — SEDIMENTATION RATE
Sed Rate: 65 mm/hr — ABNORMAL HIGH (ref 0–30)
Sed Rate: 68 mm/hr — ABNORMAL HIGH (ref 0–30)

## 2019-10-04 LAB — HEMOGLOBIN A1C
Hgb A1c MFr Bld: 7.3 % — ABNORMAL HIGH (ref 4.8–5.6)
Mean Plasma Glucose: 162.81 mg/dL

## 2019-10-04 LAB — BASIC METABOLIC PANEL
Anion gap: 11 (ref 5–15)
BUN: 41 mg/dL — ABNORMAL HIGH (ref 8–23)
CO2: 18 mmol/L — ABNORMAL LOW (ref 22–32)
Calcium: 8.7 mg/dL — ABNORMAL LOW (ref 8.9–10.3)
Chloride: 108 mmol/L (ref 98–111)
Creatinine, Ser: 2.53 mg/dL — ABNORMAL HIGH (ref 0.44–1.00)
GFR calc Af Amer: 21 mL/min — ABNORMAL LOW (ref >60)
GFR calc non Af Amer: 18 mL/min — ABNORMAL LOW (ref >60)
Glucose, Bld: 204 mg/dL — ABNORMAL HIGH (ref 70–99)
Potassium: 5.5 mmol/L — ABNORMAL HIGH (ref 3.5–5.1)
Sodium: 137 mmol/L (ref 135–145)

## 2019-10-04 LAB — RESP PANEL BY RT PCR (RSV, FLU A&B, COVID)
Influenza A by PCR: NEGATIVE
Influenza B by PCR: NEGATIVE
Respiratory Syncytial Virus by PCR: NEGATIVE
SARS Coronavirus 2 by RT PCR: NEGATIVE

## 2019-10-04 LAB — CK: Total CK: 208 U/L (ref 38–234)

## 2019-10-04 LAB — PROCALCITONIN: Procalcitonin: 2.92 ng/mL

## 2019-10-04 MED ORDER — VANCOMYCIN HCL IN DEXTROSE 1-5 GM/200ML-% IV SOLN: 1000.0000 mg | Freq: Once | INTRAVENOUS | Status: DC

## 2019-10-04 MED ORDER — ONDANSETRON HCL 4 MG/2ML IJ SOLN: 4.0000 mg | Freq: Four times a day (QID) | INTRAMUSCULAR | Status: DC | PRN

## 2019-10-04 MED ORDER — ENOXAPARIN SODIUM 30 MG/0.3ML ~~LOC~~ SOLN
30.0000 mg | SUBCUTANEOUS | Status: DC
  Administered 2019-10-04 – 2019-10-07 (×4): 30 mg via SUBCUTANEOUS
  Filled 2019-10-04 (×4): qty 0.3

## 2019-10-04 MED ORDER — PIPERACILLIN-TAZOBACTAM 3.375 G IVPB 30 MIN: 3.3750 g | Freq: Once | INTRAVENOUS | Status: DC

## 2019-10-04 MED ORDER — INSULIN ASPART 100 UNIT/ML ~~LOC~~ SOLN
0.0000 [IU] | Freq: Three times a day (TID) | SUBCUTANEOUS | Status: DC
  Administered 2019-10-04 (×2): 3 [IU] via SUBCUTANEOUS
  Administered 2019-10-05: 5 [IU] via SUBCUTANEOUS
  Administered 2019-10-05: 2 [IU] via SUBCUTANEOUS
  Administered 2019-10-05 – 2019-10-06 (×3): 3 [IU] via SUBCUTANEOUS
  Administered 2019-10-06: 2 [IU] via SUBCUTANEOUS
  Administered 2019-10-07: 5 [IU] via SUBCUTANEOUS
  Filled 2019-10-04 (×8): qty 1

## 2019-10-04 MED ORDER — ONDANSETRON HCL 4 MG PO TABS: 4.0000 mg | ORAL_TABLET | Freq: Four times a day (QID) | ORAL | Status: DC | PRN

## 2019-10-04 MED ORDER — VANCOMYCIN HCL IN DEXTROSE 1-5 GM/200ML-% IV SOLN: 1000.0000 mg | INTRAVENOUS | Status: DC

## 2019-10-04 MED ORDER — PIPERACILLIN-TAZOBACTAM 3.375 G IVPB
3.3750 g | Freq: Three times a day (TID) | INTRAVENOUS | Status: DC
  Administered 2019-10-04 – 2019-10-05 (×3): 3.375 g via INTRAVENOUS
  Filled 2019-10-04 (×4): qty 50

## 2019-10-04 MED ORDER — LEVOTHYROXINE SODIUM 50 MCG PO TABS
50.0000 ug | ORAL_TABLET | Freq: Every day | ORAL | Status: DC
  Administered 2019-10-04 – 2019-10-07 (×4): 50 ug via ORAL
  Filled 2019-10-04 (×4): qty 1

## 2019-10-04 MED ORDER — SENNOSIDES-DOCUSATE SODIUM 8.6-50 MG PO TABS: 1.0000 | ORAL_TABLET | Freq: Every evening | ORAL | Status: DC | PRN

## 2019-10-04 MED ORDER — AMLODIPINE BESYLATE 10 MG PO TABS
10.0000 mg | ORAL_TABLET | Freq: Every day | ORAL | Status: DC
  Administered 2019-10-04 – 2019-10-07 (×4): 10 mg via ORAL
  Filled 2019-10-04 (×4): qty 1

## 2019-10-04 MED ORDER — SODIUM ZIRCONIUM CYCLOSILICATE 10 G PO PACK
10.0000 g | PACK | Freq: Two times a day (BID) | ORAL | Status: DC
  Administered 2019-10-04 (×2): 10 g via ORAL
  Filled 2019-10-04 (×3): qty 1

## 2019-10-04 MED ORDER — ACETAMINOPHEN 650 MG RE SUPP: 650.0000 mg | Freq: Four times a day (QID) | RECTAL | Status: DC | PRN

## 2019-10-04 MED ORDER — ATORVASTATIN CALCIUM 20 MG PO TABS
40.0000 mg | ORAL_TABLET | Freq: Every evening | ORAL | Status: DC
  Administered 2019-10-04 – 2019-10-06 (×3): 40 mg via ORAL
  Filled 2019-10-04 (×3): qty 2

## 2019-10-04 MED ORDER — ASPIRIN 81 MG PO CHEW
81.0000 mg | CHEWABLE_TABLET | Freq: Every day | ORAL | Status: DC
  Administered 2019-10-04 – 2019-10-07 (×4): 81 mg via ORAL
  Filled 2019-10-04 (×4): qty 1

## 2019-10-04 MED ORDER — ACETAMINOPHEN 325 MG PO TABS
650.0000 mg | ORAL_TABLET | Freq: Four times a day (QID) | ORAL | Status: DC | PRN
  Administered 2019-10-05 – 2019-10-07 (×2): 650 mg via ORAL
  Filled 2019-10-04 (×2): qty 2

## 2019-10-04 MED ORDER — DONEPEZIL HCL 5 MG PO TABS
10.0000 mg | ORAL_TABLET | Freq: Every day | ORAL | Status: DC
  Administered 2019-10-05 – 2019-10-06 (×3): 10 mg via ORAL
  Filled 2019-10-04 (×3): qty 2

## 2019-10-04 MED ORDER — SODIUM CHLORIDE 0.9 % IV SOLN: INTRAVENOUS | Status: DC

## 2019-10-04 MED ORDER — INSULIN ASPART 100 UNIT/ML ~~LOC~~ SOLN: 0.0000 [IU] | Freq: Every day | SUBCUTANEOUS | Status: DC

## 2019-10-04 NOTE — H&P (Signed)
History and Physical    [name] [name] VQM:[id number] DOB: 1945/09/03 DOA: 10/03/2019  PCP: Marguerita Merles, MD   Patient coming from: Home  I have personally briefly reviewed patient's old medical records in [place]  Chief Complaint: Fall off in bed with right hip pain  HPI: [name] [name] is a 74 y.o. 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  States that she did not fall but rather slid off the bed landing onto her right hip and now has right hip pain, 5 out of 10.  She denies previously feeling unwell.  Has had no fever or chills, no recent infection. ED Course: On arrival her vitals were unremarkable.  Blood work notable for white cell count of 21,000.  Creatinine elevated at 2.26 but this is not far from her baseline for CKD.  Lactic acid 1.1.  Urinalysis with cloudy urine but negative nitrites or leukocyte esterase.  Chest x-ray with no active disease.  CT pelvis negative for acute injury of the hip but did show possible cystitis.  She was noted to have an ulcer on the right foot And x-ray of the foot showed plantar ulcer without definite evidence of osteomyelitis.  Patient was started on vancomycin and Rocephin for suspected infected diabetic foot ulcer.  Hospitalist consulted for admission..  Review of Systems: As per HPI otherwise all other systems on review of systems negative.    Past Medical History:  Diagnosis Date  . CHF (congestive heart failure) ([place])   . Diabetes mellitus without complication ([place])   . Humerus fracture 04/20/2018   left  . Hyperlipidemia   . Hypertension   . Hypothyroidism   . Vertigo    several yrs ago  . Wears dentures    partial upper and lower (loose)    Past Surgical History:  Procedure Laterality Date  .  CATARACT EXTRACTION W/PHACO Left 12/01/2018   Procedure: CATARACT EXTRACTION PHACO AND INTRAOCULAR LENS PLACEMENT ([place]) LEFT DIABETIC;  Surgeon: Birder Robson, MD;  Location: [place];  Service: Ophthalmology;  Laterality: Left;  Diabetic - insulin  . COLONOSCOPY WITH PROPOFOL N/A 07/07/2019   Procedure: COLONOSCOPY WITH PROPOFOL;  Surgeon: Robert Bellow, MD;  Location: [facility];  Service: Endoscopy;  Laterality: N/A;  . ESOPHAGOGASTRODUODENOSCOPY (EGD) WITH PROPOFOL N/A 07/07/2019   Procedure: ESOPHAGOGASTRODUODENOSCOPY (EGD) WITH PROPOFOL;  Surgeon: Robert Bellow, MD;  Location: [facility] ENDOSCOPY;  Service: Endoscopy;  Laterality: N/A;  . FOOT SURGERY Right    lesion excision     reports that she has never smoked. She has never used smokeless tobacco. She reports that she does not drink alcohol and does not use drugs.  No Known Allergies  Family History  Problem Relation Age of Onset  . Breast cancer Neg Hx       Prior to Admission medications   Medication Sig Start Date End Date Taking? Authorizing Provider  amLODipine (NORVASC) 10 MG tablet Take 1 tablet by mouth daily. 05/02/16   [provider]  ASPIRIN 81 PO Take by mouth daily.    [provider]  atorvastatin (LIPITOR) 40 MG tablet Take 1 tablet by mouth daily. 05/02/16   [provider]  CALCIUM PO Take by mouth daily.    [provider]  carvedilol (COREG) 25 MG tablet Take  1 tablet by mouth 2 (two) times daily. 05/02/16   [provider]  donepezil (ARICEPT) 10 MG tablet Take 10 mg by mouth daily. 04/21/18   [provider]  furosemide (LASIX) 20 MG tablet Take 1 tablet (20 mg total) by mouth daily. Patient not taking: Reported on 07/07/2019 05/13/19 05/12/20  Harvest Dark, MD  insulin aspart protamine- aspart (NOVOLOG MIX 70/30) (70-30) 100 UNIT/ML injection Inject 15 Units into the skin daily with breakfast.    [provider]  quinapril  (ACCUPRIL) 40 MG tablet Take 1 tablet by mouth 2 (two) times daily. 05/02/16   [provider]  SYNTHROID 50 MCG tablet Take 1 tablet by mouth daily. 04/21/18   [provider]  TRESIBA FLEXTOUCH 100 UNIT/ML SOPN FlexTouch Pen Inject 60 Units into the skin at bedtime.  04/21/18   [provider]    Physical Exam: Vitals:   10/03/19 2100 10/03/19 2130 10/03/19 2200 10/03/19 2230  BP: 138/66 (!) 149/70 140/66 124/84  Pulse: 76 68 70 64  Resp: 20 19 17 17   Temp:      SpO2: 96% 98% 97% 95%  Weight:      Height:         Vitals:   10/03/19 2100 10/03/19 2130 10/03/19 2200 10/03/19 2230  BP: 138/66 (!) 149/70 140/66 124/84  Pulse: 76 68 70 64  Resp: 20 19 17 17   Temp:      SpO2: 96% 98% 97% 95%  Weight:      Height:          Constitutional: Alert and oriented x 3 . Not in any apparent distress HEENT:      Head: Normocephalic and atraumatic.         Eyes: PERLA, EOMI, Conjunctivae are normal. Sclera is non-icteric.       Mouth/Throat: Mucous membranes are moist.       Neck: Supple with no signs of meningismus. Cardiovascular: Regular rate and rhythm. No murmurs, gallops, or rubs. 2+ symmetrical distal pulses are present . No JVD. No LE edema Respiratory: Respiratory effort normal .Lungs sounds clear bilaterally. No wheezes, crackles, or rhonchi.  Gastrointestinal: Soft, non tender, and non distended with positive bowel sounds. No rebound or guarding. Genitourinary: No CVA tenderness. Musculoskeletal: Nontender with normal range of motion in all extremities. No cyanosis, or erythema of extremities. Neurologic: Normal speech and language. Face is symmetric. Moving all extremities. No gross focal neurologic deficits . Skin:  2 cm diameter ulcer medial right heel with fat layer exposed and surrounded with redness and warmth Psychiatric: Mood and affect are normal Speech and behavior are normal   Labs on Admission: I have personally reviewed following labs  and imaging studies  CBC: Recent Labs  Lab 10/03/19 1649  WBC 21.9*  NEUTROABS 18.5*  HGB 11.9*  HCT 34.5*  MCV 88.9  PLT 416   Basic Metabolic Panel: Recent Labs  Lab 10/03/19 1720  NA 140  K 4.7  CL 111  CO2 17*  GLUCOSE 157*  BUN 35*  CREATININE 2.26*  CALCIUM 9.1   GFR: Estimated Creatinine Clearance: 26.1 mL/min (A) (by C-G formula based on SCr of 2.26 mg/dL (H)). Liver Function Tests: Recent Labs  Lab 10/03/19 1720  AST 24  ALT 16  ALKPHOS 96  BILITOT 0.9  PROT 8.0  ALBUMIN 3.7   No results for input(s): LIPASE, AMYLASE in the last 168 hours. No results for input(s): AMMONIA in the last 168 hours. Coagulation Profile: No results  for input(s): INR, PROTIME in the last 168 hours. Cardiac Enzymes: No results for input(s): CKTOTAL, CKMB, CKMBINDEX, TROPONINI in the last 168 hours. BNP (last 3 results) No results for input(s): PROBNP in the last 8760 hours. HbA1C: No results for input(s): HGBA1C in the last 72 hours. CBG: No results for input(s): GLUCAP in the last 168 hours. Lipid Profile: No results for input(s): CHOL, HDL, LDLCALC, TRIG, CHOLHDL, LDLDIRECT in the last 72 hours. Thyroid Function Tests: No results for input(s): TSH, T4TOTAL, FREET4, T3FREE, THYROIDAB in the last 72 hours. Anemia Panel: No results for input(s): VITAMINB12, FOLATE, FERRITIN, TIBC, IRON, RETICCTPCT in the last 72 hours. Urine analysis:    Component Value Date/Time   COLORURINE YELLOW (A) 10/03/2019 2218   APPEARANCEUR CLOUDY (A) 10/03/2019 2218   APPEARANCEUR Clear 04/21/2013 0715   LABSPEC 1.014 10/03/2019 2218   LABSPEC 1.015 04/21/2013 0715   PHURINE 5.0 10/03/2019 2218   GLUCOSEU NEGATIVE 10/03/2019 2218   GLUCOSEU 50 mg/dL 04/21/2013 0715   HGBUR NEGATIVE 10/03/2019 2218   BILIRUBINUR NEGATIVE 10/03/2019 2218   BILIRUBINUR Negative 04/21/2013 0715   KETONESUR NEGATIVE 10/03/2019 2218   PROTEINUR 100 (A) 10/03/2019 2218   NITRITE NEGATIVE 10/03/2019 2218    LEUKOCYTESUR NEGATIVE 10/03/2019 2218   LEUKOCYTESUR Negative 04/21/2013 0715    Radiological Exams on Admission: DG Chest 1 View  Result Date: 10/03/2019 CLINICAL DATA:  Cough EXAM: CHEST  1 VIEW COMPARISON:  05/13/2019 FINDINGS: Mild cardiomegaly. Lungs clear. No effusions. No acute bony abnormality. IMPRESSION: No active disease. Electronically Signed   By: Rolm Baptise M.D.   On: 10/03/2019 23:00   CT PELVIS WO CONTRAST  Result Date: 10/03/2019 CLINICAL DATA:  Right hip pain following a fall this afternoon. EXAM: CT PELVIS WITHOUT CONTRAST TECHNIQUE: Multidetector CT imaging of the pelvis was performed following the standard protocol without intravenous contrast. COMPARISON:  Pelvis and right hip radiographs obtained earlier today. Abdomen and pelvis CT dated 04/20/2013. FINDINGS: Urinary Tract: Mild diffuse bladder wall thickening. No bladder or ureteral calculi seen. Bowel: Mildly prominent stool in the rectum and distal sigmoid colon. No dilated bowel loops. Vascular/Lymphatic: Atheromatous arterial calcifications without aneurysm. No enlarged lymph nodes. Reproductive:  Unremarkable uterus. No adnexal masses. Other:  None. Musculoskeletal: No pelvic or hip fracture or dislocation. Lower lumbar spine degenerative changes. IMPRESSION: 1. No fracture or dislocation. 2. Mild diffuse bladder wall thickening, possibly due to cystitis. Electronically Signed   By: Claudie Revering M.D.   On: 10/03/2019 18:35   DG Foot Complete Right  Result Date: 10/03/2019 CLINICAL DATA:  Ulcer on the medial side of the heel. EXAM: RIGHT FOOT COMPLETE - 3+ VIEW COMPARISON:  April 24, 2018 FINDINGS: Again noted are advanced degenerative changes of the first metatarsophalangeal joint. There is an old healed fracture of the fourth metatarsal head. Again noted is a deformity of the midfoot and hindfoot as previously seen. There is a plantar ulcer with overlying soft tissue edema. There is no definite radiographic evidence  for osteomyelitis. IMPRESSION: 1. Plantar ulcer without definite radiographic evidence for osteomyelitis. 2. Stable mid and hindfoot deformity. Electronically Signed   By: Constance Holster M.D.   On: 10/03/2019 23:51   DG Hip Unilat W or Wo Pelvis 2-3 Views Right  Result Date: 10/03/2019 CLINICAL DATA:  Fall, right hip pain EXAM: DG HIP (WITH OR WITHOUT PELVIS) 2-3V RIGHT COMPARISON:  None. FINDINGS: No visible hip fracture, subluxation or dislocation. Mild degenerative changes in the hips bilaterally. SI joints symmetric and unremarkable. IMPRESSION:  No visible acute bony abnormality. Electronically Signed   By: Rolm Baptise M.D.   On: 10/03/2019 17:47    EKG: Independently reviewed. Interpretation : Sinus tachycardia  Assessment/Plan 74 year old female with DM, HTN, CKD 4 and hypothyroidism presenting with right hip pain following an accidental fall with work-up showing leukocytosis of 21,000.  Suspect cellulitis right foot from infected diabetic foot ulcer.  No hip fracture.  Cellulitis right foot   Diabetic ulcer of right foot associated with type 2 diabetes mellitus, with fat layer exposed ([place]) -Patient follows by podiatry -IV vancomycin and Zosyn -Deep tissue culture -Consider podiatry evaluation in the a.m. to assist with deep tissue culture  Possible UTI (urinary tract infection) -Urine was cloudy but no pyuria.  Cystitis seen on CT pelvis -Continue to monitor    Right hip pain Accidental fall at home, initial encounter -Right hip pain secondary to accidental fall where patient slid off of the bed -CT pelvis with no evidence of acute injury -Pain control    HTN (hypertension) -Continue home meds    Type 2 diabetes mellitus with other specified complication ([facility]) -Sliding scale insulin pending med rec    Hypothyroidism (acquired) -Continue levothyroxine    CKD (chronic kidney disease), stage IV ([facility]) -Creatinine 2.26 which is patient's baseline    DVT prophylaxis:  Lovenox  Code Status: full code  Family Communication:  none  Disposition Plan: Back to previous home environment Consults called: none  Status:obs    Athena Masse MD [facility]     10/04/2019, 12:07 AM

## 2019-10-04 NOTE — Progress Notes (Signed)
Pharmacy Antibiotic Note  [name] [name] is a 74 y.o. female admitted on 10/03/2019 with diabetic foot ulcer with leukocytosis. Imaging without signs of osteomyelitis. Podiatry has been consulted for further evaluation. Pharmacy has been consulted for Vancomycin and Zosyn dosing.  10/04/19  -Day 2 antibiotics -WBC 21.9 >> 19, afebrile x 24h -Scr 2.26 >> 2.53 -Podiatry has been consulted  Plan: Zosyn 3.375g IV q8h (4 hour infusion).   Vancomycin 1000 mg IV Q 48 hrs. Goal AUC 400-550. Expected AUC: 506.3, Css min 12.7 SCr used: 2.53  Continue to monitor daily Scr per protocol while on vancomycin. Renal function worse today; vancomycin + zosyn combination may be contributing.   Height: 5\' 7"  (170.2 cm) Weight: 96.9 kg (213 lb 9.6 oz) IBW/kg (Calculated) : 61.6  Temp (24hrs), Avg:97.9 F (36.6 C), Min:97.6 F (36.4 C), Max:98.4 F (36.9 C)  Recent Labs  Lab 10/03/19 1649 10/03/19 1720 10/03/19 2013 10/04/19 0503  WBC 21.9*  --   --  19.0*  CREATININE  --  2.26*  --  2.53*  LATICACIDVEN  --   --  1.1  --     Estimated Creatinine Clearance: 23.7 mL/min (A) (by C-G formula based on SCr of 2.53 mg/dL (H)).    No Known Allergies  Antimicrobials this admission: Vancomycin 6/27 >>  Zosyn 6/27 >>   Dose adjustments this admission: n/a  Microbiology results: 6/27 BCx: NG < 12h 6/27 UCx: pending  Thank you for allowing pharmacy to be a part of this patient's care.  Benita Gutter 10/04/2019 12:31 PM

## 2019-10-04 NOTE — Progress Notes (Addendum)
Pharmacy Antibiotic Note  [name] [name] is a 74 y.o. female admitted on 10/03/2019 with cellulitis.  Pharmacy has been consulted for Vancomycin and Zosyn dosing.  Plan: Zosyn 3.375g IV q8h (4 hour infusion).   Vancomycin 1000 mg IV Q 48 hrs. Goal AUC 400-550. Expected AUC: 477.7, Css min 11.4 SCr used: 2.26 Pt did not receive a full loading dose so will start the next dose early  Height: 5\' 7"  (170.2 cm) Weight: 93.9 kg (207 lb) IBW/kg (Calculated) : 61.6  Temp (24hrs), Avg:97.7 F (36.5 C), Min:97.7 F (36.5 C), Max:97.7 F (36.5 C)  Recent Labs  Lab 10/03/19 1649 10/03/19 1720 10/03/19 2013  WBC 21.9*  --   --   CREATININE  --  2.26*  --   LATICACIDVEN  --   --  1.1    Estimated Creatinine Clearance: 26.1 mL/min (A) (by C-G formula based on SCr of 2.26 mg/dL (H)).    No Known Allergies  Antimicrobials this admission:   >>    >>   Dose adjustments this admission:   Microbiology results:  BCx:   UCx:    Sputum:    MRSA PCR:   Thank you for allowing pharmacy to be a part of this patient's care.  Hart Robinsons A 10/04/2019 2:27 AM

## 2019-10-04 NOTE — Plan of Care (Signed)
Pt received alert and oriented x 4. Verbal denies pain. No distress noted on room air, x 1 assist with mobility purewick for incontinence, tele running Sr, recently changed to NPO until ultrasound complete today. Continue with current plan of care Staff will continue to monitor

## 2019-10-04 NOTE — Consult Note (Signed)
ORTHOPAEDIC CONSULTATION  REQUESTING PHYSICIAN: Loletha Grayer, MD  Chief Complaint: Right foot ulcer  HPI: [name] [name] is a 74 y.o. female who complains of right foot ulceration.  This been a chronic issue and has been seen in the outpatient clinic by podiatry in the past.  She has a chronic history of Charcot neuroarthropathy.  Her son has been performing dressing changes.  She typically is followed about every 3 weeks in the outpatient clinic.  Recently admitted with an elevated white blood cell count.  Concern was infection to the right foot.  Past Medical History:  Diagnosis Date  . CHF (congestive heart failure) ([place])   . Diabetes mellitus without complication ([employer])   . Humerus fracture 04/20/2018   left  . Hyperlipidemia   . Hypertension   . Hypothyroidism   . Vertigo    several yrs ago  . Wears dentures    partial upper and lower (loose)   Past Surgical History:  Procedure Laterality Date  . CATARACT EXTRACTION W/PHACO Left 12/01/2018   Procedure: CATARACT EXTRACTION PHACO AND INTRAOCULAR LENS PLACEMENT ([place]) LEFT DIABETIC;  Surgeon: Birder Robson, MD;  Location: [place];  Service: Ophthalmology;  Laterality: Left;  Diabetic - insulin  . COLONOSCOPY WITH PROPOFOL N/A 07/07/2019   Procedure: COLONOSCOPY WITH PROPOFOL;  Surgeon: Robert Bellow, MD;  Location: [facility];  Service: Endoscopy;  Laterality: N/A;  . ESOPHAGOGASTRODUODENOSCOPY (EGD) WITH PROPOFOL N/A 07/07/2019   Procedure: ESOPHAGOGASTRODUODENOSCOPY (EGD) WITH PROPOFOL;  Surgeon: Robert Bellow, MD;  Location: [facility];  Service: Endoscopy;  Laterality: N/A;  . FOOT SURGERY Right    lesion excision   Social History   Socioeconomic History  . Marital status: Divorced    Spouse name: Not on file  . Number of children: Not on file  . Years of education: Not on file  . Highest education level: Not on file  Occupational History  . Not on file  Tobacco Use  . Smoking  status: Never Smoker  . Smokeless tobacco: Never Used  Vaping Use  . Vaping Use: Never used  Substance and Sexual Activity  . Alcohol use: No  . Drug use: Never  . Sexual activity: Not on file  Other Topics Concern  . Not on file  Social History Narrative  . Not on file   Social Determinants of Health   Financial Resource Strain:   . Difficulty of Paying Living Expenses:   Food Insecurity:   . Worried About Charity fundraiser in the Last Year:   . Arboriculturist in the Last Year:   Transportation Needs:   . Film/video editor (Medical):   Marland Kitchen Lack of Transportation (Non-Medical):   Physical Activity:   . Days of Exercise per Week:   . Minutes of Exercise per Session:   Stress:   . Feeling of Stress :   Social Connections:   . Frequency of Communication with Friends and Family:   . Frequency of Social Gatherings with Friends and Family:   . Attends Religious Services:   . Active Member of Clubs or Organizations:   . Attends Archivist Meetings:   Marland Kitchen Marital Status:    Family History  Problem Relation Age of Onset  . Breast cancer Neg Hx    No Known Allergies Prior to Admission medications   Medication Sig Start Date End Date Taking? Authorizing Provider  insulin aspart protamine- aspart (NOVOLOG MIX 70/30) (70-30) 100 UNIT/ML injection Inject 15 Units into  the skin daily with breakfast.   Yes [provider]  TRESIBA FLEXTOUCH 100 UNIT/ML SOPN FlexTouch Pen Inject 80 Units into the skin at bedtime.  04/21/18  Yes [provider]  amLODipine (NORVASC) 10 MG tablet Take 1 tablet by mouth daily. 05/02/16   [provider]  ASPIRIN 81 PO Take by mouth daily.    [provider]  atorvastatin (LIPITOR) 40 MG tablet Take 1 tablet by mouth daily. 05/02/16   [provider]  CALCIUM PO Take by mouth daily.    [provider]  carvedilol (COREG) 25 MG tablet Take 1 tablet by mouth 2 (two) times daily. 05/02/16    [provider]  donepezil (ARICEPT) 10 MG tablet Take 10 mg by mouth daily. 04/21/18   [provider]  predniSONE (DELTASONE) 10 MG tablet Take 50 mg by mouth daily. 10/03/19 10/07/19  [provider]  quinapril (ACCUPRIL) 40 MG tablet Take 1 tablet by mouth 2 (two) times daily. 05/02/16   [provider]  SYNTHROID 50 MCG tablet Take 1 tablet by mouth daily. 04/21/18   [provider]   DG Chest 1 View  Result Date: 10/03/2019 CLINICAL DATA:  Cough EXAM: CHEST  1 VIEW COMPARISON:  05/13/2019 FINDINGS: Mild cardiomegaly. Lungs clear. No effusions. No acute bony abnormality. IMPRESSION: No active disease. Electronically Signed   By: Rolm Baptise M.D.   On: 10/03/2019 23:00   CT PELVIS WO CONTRAST  Result Date: 10/03/2019 CLINICAL DATA:  Right hip pain following a fall this afternoon. EXAM: CT PELVIS WITHOUT CONTRAST TECHNIQUE: Multidetector CT imaging of the pelvis was performed following the standard protocol without intravenous contrast. COMPARISON:  Pelvis and right hip radiographs obtained earlier today. Abdomen and pelvis CT dated 04/20/2013. FINDINGS: Urinary Tract: Mild diffuse bladder wall thickening. No bladder or ureteral calculi seen. Bowel: Mildly prominent stool in the rectum and distal sigmoid colon. No dilated bowel loops. Vascular/Lymphatic: Atheromatous arterial calcifications without aneurysm. No enlarged lymph nodes. Reproductive:  Unremarkable uterus. No adnexal masses. Other:  None. Musculoskeletal: No pelvic or hip fracture or dislocation. Lower lumbar spine degenerative changes. IMPRESSION: 1. No fracture or dislocation. 2. Mild diffuse bladder wall thickening, possibly due to cystitis. Electronically Signed   By: Claudie Revering M.D.   On: 10/03/2019 18:35   US Venous Img Lower Bilateral (DVT)  Result Date: 10/04/2019 CLINICAL DATA:  Bilateral leg and knee pain. EXAM: BILATERAL LOWER EXTREMITY VENOUS DOPPLER ULTRASOUND TECHNIQUE: Gray-scale  sonography with compression, as well as color and duplex ultrasound, were performed to evaluate the deep venous system(s) from the level of the common femoral vein through the popliteal and proximal calf veins. COMPARISON:  None recent FINDINGS: VENOUS Normal compressibility of the common femoral, superficial femoral, and popliteal veins, as well as the visualized calf veins. Visualized portions of profunda femoral vein and great saphenous vein unremarkable. No filling defects to suggest DVT on grayscale or color Doppler imaging. Doppler waveforms show normal direction of venous flow, normal respiratory plasticity and response to augmentation. OTHER None. Limitations: none IMPRESSION: Negative. Electronically Signed   By: Constance Holster M.D.   On: 10/04/2019 16:12   DG Foot Complete Right  Result Date: 10/03/2019 CLINICAL DATA:  Ulcer on the medial side of the heel. EXAM: RIGHT FOOT COMPLETE - 3+ VIEW COMPARISON:  April 24, 2018 FINDINGS: Again noted are advanced degenerative changes of the first metatarsophalangeal joint. There is an old healed fracture of the fourth metatarsal head. Again noted is a deformity  of the midfoot and hindfoot as previously seen. There is a plantar ulcer with overlying soft tissue edema. There is no definite radiographic evidence for osteomyelitis. IMPRESSION: 1. Plantar ulcer without definite radiographic evidence for osteomyelitis. 2. Stable mid and hindfoot deformity. Electronically Signed   By: Constance Holster M.D.   On: 10/03/2019 23:51   DG Hip Unilat W or Wo Pelvis 2-3 Views Right  Result Date: 10/03/2019 CLINICAL DATA:  Fall, right hip pain EXAM: DG HIP (WITH OR WITHOUT PELVIS) 2-3V RIGHT COMPARISON:  None. FINDINGS: No visible hip fracture, subluxation or dislocation. Mild degenerative changes in the hips bilaterally. SI joints symmetric and unremarkable. IMPRESSION: No visible acute bony abnormality. Electronically Signed   By: Rolm Baptise M.D.   On:  10/03/2019 17:47   US Abdomen Limited RUQ  Result Date: 10/04/2019 CLINICAL DATA:  Pain EXAM: ULTRASOUND ABDOMEN LIMITED RIGHT UPPER QUADRANT COMPARISON:  Ultrasound dated April 21, 2013. FINDINGS: Gallbladder: The gallbladder is distended with a small amount of gallbladder sludge. There is no evidence for cholelithiasis. There is no gallbladder wall thickening. The sonographic Percell Miller sign is negative. Common bile duct: Diameter: 4 mm Liver: Diffuse increased echogenicity with slightly heterogeneous liver. Appearance typically secondary to fatty infiltration. Fibrosis secondary consideration. No secondary findings of cirrhosis noted. No focal hepatic lesion or intrahepatic biliary duct dilatation. Portal vein is patent on color Doppler imaging with normal direction of blood flow towards the liver. Other: None. IMPRESSION: 1. Distended gallbladder without evidence for cholelithiasis or acute cholecystitis. 2. Hepatic steatosis. 3. There is a small amount of gallbladder sludge within the gallbladder lumen. Electronically Signed   By: Constance Holster M.D.   On: 10/04/2019 16:13    Positive ROS: All other systems have been reviewed and were otherwise negative with the exception of those mentioned in the HPI and as above.  12 point ROS was performed.  Physical Exam: General: Alert and oriented.  No apparent distress.  Vascular:  Left foot:Dorsalis Pedis:  diminished Posterior Tibial:  diminished  Right foot: Dorsalis Pedis:  diminished Posterior Tibial:  diminished  Neuro:absent protective sensation  Derm: No ulceration on the left foot at this time.  There is a moderate 1/2 cm full-thickness ulcer on the right medial arch region.  There is fibrotic tissue in the central aspect with some surrounding hyperkeratosis.  No active purulent drainage.  No surrounding erythema.  The wound does not probe deep through the subcutaneous tissue at this time.  Ortho/MS: She has a Charcot deformity on the  right foot with lateral positioning of the foot to the leg.  There is prominence at the medial talar head region at the site of the ulcer.  Assessment: Neuropathic ulcer plantar medial right arch  Charcot foot deformity  Plan: This wound looks to be stable at this time.  The patient states that the positioning of the foot is no worse than previous.  Her son also occurred this is not acutely dislocated.  This looks to be a chronic issue.  This does not probe deep.  I evaluated the x-rays and there is no erosive changes.  It does show the chronic neuropathic Charcot deformity.  At this point just continue with local wound care at home.  Continue with close follow-up in the outpatient clinic with podiatry.  Should maintain minimal ambulation on this right foot to assist with wound healing.    Elesa Hacker, DPM Cell [phone]   10/04/2019 5:40 PM

## 2019-10-04 NOTE — Progress Notes (Signed)
Patient ID: [name] [name], female   DOB: 07/27/1945, 74 y.o.   MRN: [id number] [facility] PROGRESS NOTE  [name] [name] MRN:[id number] DOB: 05/07/1945 DOA: 10/03/2019 PCP: Miles, Linda M, MD  HPI/Subjective: Patient initially came in with hip pain and found to have an elevated white blood cell count.  She was admitted to the hospital and placed on antibiotics for suspected diabetic foot ulcer infection.  Patient complains of entire right leg pain including in her right thigh.  Also having pain in her right upper quadrant.  Objective: Vitals:   10/04/19 0759 10/04/19 1130  BP: (!) 155/79 (!) 155/83  Pulse: 65 (!) 51  Resp: 17 17  Temp: 97.8 F (36.6 C) 97.6 F (36.4 C)  SpO2: 97% 97%    Intake/Output Summary (Last 24 hours) at 10/04/2019 1400 Last data filed at 10/04/2019 1006 Gross per 24 hour  Intake 277.68 ml  Output --  Net 277.68 ml   Filed Weights   10/03/19 1646 10/04/19 0329  Weight: 93.9 kg 96.9 kg    ROS: Review of Systems  Respiratory: Negative for shortness of breath.   Cardiovascular: Negative for chest pain.  Gastrointestinal: Positive for abdominal pain. Negative for constipation, diarrhea, nausea and vomiting.  Musculoskeletal: Positive for joint pain and myalgias.   Exam: Physical Exam HENT:     Nose: No mucosal edema.     Mouth/Throat:     Pharynx: No oropharyngeal exudate.  Eyes:     General: Lids are normal.     Conjunctiva/sclera: Conjunctivae normal.     Pupils: Pupils are equal, round, and reactive to light.  Cardiovascular:     Rate and Rhythm: Normal rate and regular rhythm.     Heart sounds: Normal heart sounds, S1 normal and S2 normal.  Pulmonary:     Breath sounds: No decreased breath sounds, wheezing, rhonchi or rales.  Abdominal:     Palpations: Abdomen is soft.     Tenderness: There is abdominal tenderness in the right upper quadrant.  Musculoskeletal:     Right ankle: No swelling.     Left ankle: No swelling.  Skin:     General: Skin is warm.     Comments: See picture for diabetic foot ulcer  Neurological:     Mental Status: She is alert and oriented to person, place, and time.  Psychiatric:        Attention and Perception: Attention normal.        Mood and Affect: Mood normal.         Data Reviewed: Basic Metabolic Panel: Recent Labs  Lab 10/03/19 1720 10/04/19 0503  NA 140 137  K 4.7 5.5*  CL 111 108  CO2 17* 18*  GLUCOSE 157* 204*  BUN 35* 41*  CREATININE 2.26* 2.53*  CALCIUM 9.1 8.7*   Liver Function Tests: Recent Labs  Lab 10/03/19 1720  AST 24  ALT 16  ALKPHOS 96  BILITOT 0.9  PROT 8.0  ALBUMIN 3.7   CBC: Recent Labs  Lab 10/03/19 1649 10/04/19 0503  WBC 21.9* 19.0*  NEUTROABS 18.5*  --   HGB 11.9* 10.7*  HCT 34.5* 32.7*  MCV 88.9 92.6  PLT 204 187    CBG: Recent Labs  Lab 10/04/19 0801 10/04/19 1130  GLUCAP 195* 165*    Recent Results (from the past 240 hour(s))  Blood culture (routine x 2)     Status: None (Preliminary result)   Collection Time: 10/03/19  8:13 PM   Specimen: BLOOD    Result Value Ref Range Status   Specimen Description BLOOD LEFT HAND  Final   Special Requests   Final    BOTTLES DRAWN AEROBIC AND ANAEROBIC Blood Culture results may not be optimal due to an inadequate volume of blood received in culture bottles   Culture   Final    NO GROWTH < 12 HOURS Performed at [facility], [address] [place], [place] [address]    Report Status PENDING  Incomplete  Blood culture (routine x 2)     Status: None (Preliminary result)   Collection Time: 10/03/19  8:13 PM   Specimen: BLOOD  Result Value Ref Range Status   Specimen Description BLOOD LEFT AC  Final   Special Requests   Final    BOTTLES DRAWN AEROBIC AND ANAEROBIC Blood Culture adequate volume   Culture   Final    NO GROWTH < 12 HOURS Performed at [facility], [address] [place], [place] [address]    Report Status PENDING  Incomplete  Resp Panel by  RT PCR (RSV, Flu A&B, Covid) - Nasopharyngeal Swab     Status: None   Collection Time: 10/04/19 12:47 AM   Specimen: Nasopharyngeal Swab  Result Value Ref Range Status   SARS Coronavirus 2 by RT PCR NEGATIVE NEGATIVE Final    Comment: (NOTE) SARS-CoV-2 target nucleic acids are NOT DETECTED.  The SARS-CoV-2 RNA is generally detectable in upper respiratoy specimens during the acute phase of infection. The lowest concentration of SARS-CoV-2 viral copies this assay can detect is 131 copies/mL. A negative result does not preclude SARS-Cov-2 infection and should not be used as the sole basis for treatment or other patient management decisions. A negative result may occur with  improper specimen collection/handling, submission of specimen other than nasopharyngeal swab, presence of viral mutation(s) within the areas targeted by this assay, and inadequate number of viral copies (<131 copies/mL). A negative result must be combined with clinical observations, patient history, and epidemiological information. The expected result is Negative.  Fact Sheet for Patients:  https://www.fda.gov/media/142436/download  Fact Sheet for Healthcare Providers:  https://www.fda.gov/media/142435/download  This test is no t yet approved or cleared by the [place] FDA and  has been authorized for detection and/or diagnosis of SARS-CoV-2 by FDA under an Emergency Use Authorization (EUA). This EUA will remain  in effect (meaning this test can be used) for the duration of the COVID-19 declaration under Section 564(b)(1) of the Act, 21 U.S.C. section 360bbb-3(b)(1), unless the authorization is terminated or revoked sooner.     Influenza A by PCR NEGATIVE NEGATIVE Final   Influenza B by PCR NEGATIVE NEGATIVE Final    Comment: (NOTE) The Xpert Xpress SARS-CoV-2/FLU/RSV assay is intended as an aid in  the diagnosis of influenza from Nasopharyngeal swab specimens and  should not be used as a sole basis for  treatment. Nasal washings and  aspirates are unacceptable for Xpert Xpress SARS-CoV-2/FLU/RSV  testing.  Fact Sheet for Patients: https://www.fda.gov/media/142436/download  Fact Sheet for Healthcare Providers: https://www.fda.gov/media/142435/download  This test is not yet approved or cleared by the [place] FDA and  has been authorized for detection and/or diagnosis of SARS-CoV-2 by  FDA under an Emergency Use Authorization (EUA). This EUA will remain  in effect (meaning this test can be used) for the duration of the  Covid-19 declaration under Section 564(b)(1) of the Act, 21  U.S.C. section 360bbb-3(b)(1), unless the authorization is  terminated or revoked.    Respiratory Syncytial Virus by PCR NEGATIVE   NEGATIVE Final    Comment: (NOTE) Fact Sheet for Patients: PinkCheek.be  Fact Sheet for Healthcare Providers: GravelBags.it  This test is not yet approved or cleared by the [place] FDA and  has been authorized for detection and/or diagnosis of SARS-CoV-2 by  FDA under an Emergency Use Authorization (EUA). This EUA will remain  in effect (meaning this test can be used) for the duration of the  COVID-19 declaration under Section 564(b)(1) of the Act, 21 U.S.C.  section 360bbb-3(b)(1), unless the authorization is terminated or  revoked. Performed at [facility], [address] [place], [place] [address]      Studies: DG Chest 1 View  Result Date: 10/03/2019 CLINICAL DATA:  Cough EXAM: CHEST  1 VIEW COMPARISON:  05/13/2019 FINDINGS: Mild cardiomegaly. Lungs clear. No effusions. No acute bony abnormality. IMPRESSION: No active disease. Electronically Signed   By: Rolm Baptise M.D.   On: 10/03/2019 23:00   CT PELVIS WO CONTRAST  Result Date: 10/03/2019 CLINICAL DATA:  Right hip pain following a fall this afternoon. EXAM: CT PELVIS WITHOUT CONTRAST TECHNIQUE: Multidetector CT imaging of the pelvis  was performed following the standard protocol without intravenous contrast. COMPARISON:  Pelvis and right hip radiographs obtained earlier today. Abdomen and pelvis CT dated 04/20/2013. FINDINGS: Urinary Tract: Mild diffuse bladder wall thickening. No bladder or ureteral calculi seen. Bowel: Mildly prominent stool in the rectum and distal sigmoid colon. No dilated bowel loops. Vascular/Lymphatic: Atheromatous arterial calcifications without aneurysm. No enlarged lymph nodes. Reproductive:  Unremarkable uterus. No adnexal masses. Other:  None. Musculoskeletal: No pelvic or hip fracture or dislocation. Lower lumbar spine degenerative changes. IMPRESSION: 1. No fracture or dislocation. 2. Mild diffuse bladder wall thickening, possibly due to cystitis. Electronically Signed   By: Claudie Revering M.D.   On: 10/03/2019 18:35   DG Foot Complete Right  Result Date: 10/03/2019 CLINICAL DATA:  Ulcer on the medial side of the heel. EXAM: RIGHT FOOT COMPLETE - 3+ VIEW COMPARISON:  April 24, 2018 FINDINGS: Again noted are advanced degenerative changes of the first metatarsophalangeal joint. There is an old healed fracture of the fourth metatarsal head. Again noted is a deformity of the midfoot and hindfoot as previously seen. There is a plantar ulcer with overlying soft tissue edema. There is no definite radiographic evidence for osteomyelitis. IMPRESSION: 1. Plantar ulcer without definite radiographic evidence for osteomyelitis. 2. Stable mid and hindfoot deformity. Electronically Signed   By: Constance Holster M.D.   On: 10/03/2019 23:51   DG Hip Unilat W or Wo Pelvis 2-3 Views Right  Result Date: 10/03/2019 CLINICAL DATA:  Fall, right hip pain EXAM: DG HIP (WITH OR WITHOUT PELVIS) 2-3V RIGHT COMPARISON:  None. FINDINGS: No visible hip fracture, subluxation or dislocation. Mild degenerative changes in the hips bilaterally. SI joints symmetric and unremarkable. IMPRESSION: No visible acute bony abnormality.  Electronically Signed   By: Rolm Baptise M.D.   On: 10/03/2019 17:47    Scheduled Meds: . enoxaparin (LOVENOX) injection  30 mg Subcutaneous Q24H  . insulin aspart  0-15 Units Subcutaneous TID WC  . insulin aspart  0-5 Units Subcutaneous QHS  . sodium zirconium cyclosilicate  10 g Oral BID   Continuous Infusions: . sodium chloride    . piperacillin-tazobactam (ZOSYN)  IV 3.375 g (10/04/19 1057)  . [START ON 10/05/2019] vancomycin      Assessment/Plan:  1. Diabetic foot ulcer with leukocytosis.  Seems chronic in nature.  Patient placed on vancomycin and Zosyn.  Will get  podiatry consultation for their opinion.  X-ray does not show any signs of osteomyelitis.  Add on ESR. 2. Acute kidney injury on chronic kidney disease stage IV with hyperkalemia.  Start Lokelma twice a day.  Give gentle IV fluids.  Hold lisinopril 3. Type 2 diabetes mellitus with chronic kidney disease stage IV.  Patient on sliding scale insulin.  Holding long-acting insulin right now.  Hemoglobin A1c good at 7.3. 4. Right hip pain and right leg pain.  CT scan negative for fracture we will get an ultrasound to rule out DVT.  Will need physical therapy evaluation. 5. Right upper quadrant pain we will get an ultrasound of the right upper quadrant to rule out gallbladder disease 6. Hyperlipidemia unspecified on Lipitor 7. Hypothyroidism unspecified on levothyroxine 8.   Mild memory loss on aricept    Code Status:     Code Status Orders  (From admission, onward)         Start     Ordered   10/04/19 0004  Full code  Continuous        10/04/19 0006        Code Status History    Date Active Date Inactive Code Status Order ID Comments User Context   04/25/2018 0211 04/27/2018 1756 Full Code [id number]  Mayo, Katy Dodd, MD Inpatient   Advance Care Planning Activity    Advance Directive Documentation     Most Recent Value  Type of Advance Directive Living will  Pre-existing out of facility DNR order (yellow form or  pink MOST form) --  "MOST" Form in Place? --     Family Communication: Spoke with son on the phone Disposition Plan: Status is: Observation  Dispo: The patient is from: Home              Anticipated d/c is to: Home              Anticipated d/c date is: Dependent on whether she is able to walk or not.  Would also like to see potassium and kidney function little bit better prior to disposition.              Patient currently being treated for infected foot ulcer with elevated white blood cell count.  Also getting an ultrasound of the lower extremity to rule out DVT since she is having pain in her right leg.  Getting a right upper quadrant ultrasound because she has pain in her right upper abdomen.  Being treated for hyperkalemia.  Antibiotics:  Vancomycin  Zosyn  Time spent: 34 minutes     [facility]            

## 2019-10-04 NOTE — Evaluation (Signed)
Physical Therapy Evaluation Patient Details Name: [name] [name] MRN: [id number] DOB: 1945-09-04 Today's Date: 10/04/2019   History of Present Illness  [name] [name] is a 74 y.o. 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  She denies any complaints other than right hip pain.  Clinical Impression  Pt admitted with above diagnosis. Pt currently with functional limitations due to the deficits listed below (see "PT Problem List"). Upon entry, pt in bed, awake and agreeable to participate; son in room. The pt is alert and oriented x4, pleasant, conversational, and generally a good historian. Pt reports chronic issues with charcot foot and nonhealing ulcer on RLE, but not weight bearing restrictions. Pt has orthopedic shoe with pelite insert for Left and Rt darco shoe, both used for STS and pivot transfers to recliner. Podiatry consult still pending, will follow for recommendations regarding updates to weightbearing status. AMB deferred until s/p podiatry input. Bed mobility and transfers are labored, but without gross LOB. Rt hip feeling better, pain much improved, but pt still has pain from hip all the way distal to Rt foot wound in weightbearing. Functional mobility assessment demonstrates increased effort/time requirements, poor tolerance, and need for physical assistance, whereas the patient performed these at a higher level of independence PTA. Pt will benefit from skilled PT intervention to increase independence and safety with basic mobility in preparation for discharge to the venue listed below.       Follow Up Recommendations Home health PT;Supervision - Intermittent    Equipment Recommendations  Other (comment) (will depend of weight bearing recommedations from podiatry.)    Recommendations for Other Services        Precautions / Restrictions Precautions Precautions: Fall Restrictions Weight Bearing Restrictions: No      Mobility  Bed Mobility Overal bed mobility: Modified Independent             General bed mobility comments: takes 2-3 minutes due to weakness, but able to perform. Has some back pain.  Transfers Overall transfer level: Needs assistance   Transfers: Sit to/from Stand Sit to Stand: Min guard         General transfer comment: Multiple attempts due to difficulty with forward excursion, but follows cues for foward lean, does better with elevated EOB.  Ambulation/Gait Ambulation/Gait assistance:  (deferred, pt has no weight bearing status yet, has not seen podiatry yet)              Stairs            Wheelchair Mobility    Modified Rankin (Stroke Patients Only)       Balance Overall balance assessment: Modified Independent;No apparent balance deficits (not formally assessed);History of Falls                                           Pertinent Vitals/Pain Pain Assessment: No/denies pain Pain Location: Rt hip pain is improved.    Home Living Family/patient expects to be discharged to:: Private residence Living Arrangements: Children Available Help at Discharge: Family;Personal care attendant (Son works PT; pt has Engineer, production in home MWF 9-11:30 for ADL assist.) Type of Home: House Home Access: Stairs to enter Entrance Stairs-Rails: Chemical engineer of Steps: [place]: One  level Home Equipment: Cane - single point;Shower seat Additional Comments: Denies any falls history in the last 6 months, but fell i nJan 2020 c Left humeral fracture.    Prior Function Level of Independence: Independent with assistive device(s)         Comments: SPC use for longer distances; son has helped with stairs at entry for >1 year. Uses Dardco shoe for foot ulcer, but unclear about weight bearing recommendations.     Hand  Dominance        Extremity/Trunk Assessment   Upper Extremity Assessment Upper Extremity Assessment: Generalized weakness    Lower Extremity Assessment Lower Extremity Assessment: Generalized weakness       Communication      Cognition Arousal/Alertness: Awake/alert Behavior During Therapy: WFL for tasks assessed/performed Overall Cognitive Status: Within Functional Limits for tasks assessed                                        General Comments      Exercises     Assessment/Plan    PT Assessment Patient needs continued PT services  PT Problem List Decreased strength;Decreased activity tolerance;Decreased balance;Decreased mobility;Decreased safety awareness;Decreased knowledge of precautions;Obesity       PT Treatment Interventions DME instruction;Gait training;Stair training;Functional mobility training;Therapeutic activities;Therapeutic exercise;Patient/family education    PT Goals (Current goals can be found in the Care Plan section)  Acute Rehab PT Goals Patient Stated Goal: regain strength, reduce pain, return to home PT Goal Formulation: With patient Time For Goal Achievement: 10/18/19 Potential to Achieve Goals: Fair    Frequency Min 2X/week   Barriers to discharge        Co-evaluation               AM-PAC PT "6 Clicks" Mobility  Outcome Measure Help needed turning from your back to your side while in a flat bed without using bedrails?: None Help needed moving from lying on your back to sitting on the side of a flat bed without using bedrails?: None Help needed moving to and from a bed to a chair (including a wheelchair)?: A Little Help needed standing up from a chair using your arms (e.g., wheelchair or bedside chair)?: A Little Help needed to walk in hospital room?: A Lot Help needed climbing 3-5 steps with a railing? : A Lot 6 Click Score: 18    End of Session   Activity Tolerance: Patient tolerated treatment well;No  increased pain;Patient limited by fatigue;Patient limited by pain Patient left: in chair;with family/visitor present;with call bell/phone within reach;with SCD's reapplied;with chair alarm set Nurse Communication: Mobility status PT Visit Diagnosis: Unsteadiness on feet (R26.81);Other abnormalities of gait and mobility (R26.89);Difficulty in walking, not elsewhere classified (R26.2)    Time: 3419-3790 PT Time Calculation (min) (ACUTE ONLY): 34 min   Charges:   PT Evaluation $PT Eval Moderate Complexity: 1 Mod PT Treatments $Self Care/Home Management: 8-22        3:32 PM, 10/04/19 Etta Grandchild, PT, DPT Physical Therapist - [facility]  [phone] ([place])    [place] C 10/04/2019, 3:27 PM

## 2019-10-05 ENCOUNTER — Observation Stay: Payer: Medicare Other

## 2019-10-05 DIAGNOSIS — Z6833 Body mass index (BMI) 33.0-33.9, adult: Secondary | ICD-10-CM | POA: Diagnosis not present

## 2019-10-05 DIAGNOSIS — Z794 Long term (current) use of insulin: Secondary | ICD-10-CM | POA: Diagnosis not present

## 2019-10-05 DIAGNOSIS — R410 Disorientation, unspecified: Secondary | ICD-10-CM | POA: Diagnosis present

## 2019-10-05 DIAGNOSIS — Z79899 Other long term (current) drug therapy: Secondary | ICD-10-CM | POA: Diagnosis not present

## 2019-10-05 DIAGNOSIS — E11621 Type 2 diabetes mellitus with foot ulcer: Secondary | ICD-10-CM | POA: Diagnosis present

## 2019-10-05 DIAGNOSIS — E039 Hypothyroidism, unspecified: Secondary | ICD-10-CM | POA: Diagnosis present

## 2019-10-05 DIAGNOSIS — Z7952 Long term (current) use of systemic steroids: Secondary | ICD-10-CM | POA: Diagnosis not present

## 2019-10-05 DIAGNOSIS — M79604 Pain in right leg: Secondary | ICD-10-CM | POA: Diagnosis not present

## 2019-10-05 DIAGNOSIS — L03115 Cellulitis of right lower limb: Secondary | ICD-10-CM | POA: Diagnosis present

## 2019-10-05 DIAGNOSIS — L97512 Non-pressure chronic ulcer of other part of right foot with fat layer exposed: Secondary | ICD-10-CM | POA: Diagnosis present

## 2019-10-05 DIAGNOSIS — E785 Hyperlipidemia, unspecified: Secondary | ICD-10-CM | POA: Diagnosis present

## 2019-10-05 DIAGNOSIS — E669 Obesity, unspecified: Secondary | ICD-10-CM | POA: Diagnosis present

## 2019-10-05 DIAGNOSIS — R0789 Other chest pain: Secondary | ICD-10-CM | POA: Diagnosis present

## 2019-10-05 DIAGNOSIS — N184 Chronic kidney disease, stage 4 (severe): Secondary | ICD-10-CM | POA: Diagnosis present

## 2019-10-05 DIAGNOSIS — R1011 Right upper quadrant pain: Secondary | ICD-10-CM | POA: Diagnosis present

## 2019-10-05 DIAGNOSIS — D631 Anemia in chronic kidney disease: Secondary | ICD-10-CM | POA: Diagnosis present

## 2019-10-05 DIAGNOSIS — E875 Hyperkalemia: Secondary | ICD-10-CM

## 2019-10-05 DIAGNOSIS — E1140 Type 2 diabetes mellitus with diabetic neuropathy, unspecified: Secondary | ICD-10-CM | POA: Diagnosis present

## 2019-10-05 DIAGNOSIS — I129 Hypertensive chronic kidney disease with stage 1 through stage 4 chronic kidney disease, or unspecified chronic kidney disease: Secondary | ICD-10-CM | POA: Diagnosis present

## 2019-10-05 DIAGNOSIS — M25551 Pain in right hip: Secondary | ICD-10-CM | POA: Diagnosis present

## 2019-10-05 DIAGNOSIS — N179 Acute kidney failure, unspecified: Secondary | ICD-10-CM | POA: Diagnosis present

## 2019-10-05 DIAGNOSIS — Z20822 Contact with and (suspected) exposure to covid-19: Secondary | ICD-10-CM | POA: Diagnosis present

## 2019-10-05 DIAGNOSIS — R0781 Pleurodynia: Secondary | ICD-10-CM

## 2019-10-05 DIAGNOSIS — Z7982 Long term (current) use of aspirin: Secondary | ICD-10-CM | POA: Diagnosis not present

## 2019-10-05 DIAGNOSIS — E1122 Type 2 diabetes mellitus with diabetic chronic kidney disease: Secondary | ICD-10-CM | POA: Diagnosis present

## 2019-10-05 DIAGNOSIS — E11610 Type 2 diabetes mellitus with diabetic neuropathic arthropathy: Secondary | ICD-10-CM | POA: Diagnosis present

## 2019-10-05 LAB — URINE CULTURE: Culture: NO GROWTH

## 2019-10-05 LAB — BASIC METABOLIC PANEL
Anion gap: 10 (ref 5–15)
BUN: 50 mg/dL — ABNORMAL HIGH (ref 8–23)
CO2: 20 mmol/L — ABNORMAL LOW (ref 22–32)
Calcium: 8.3 mg/dL — ABNORMAL LOW (ref 8.9–10.3)
Chloride: 108 mmol/L (ref 98–111)
Creatinine, Ser: 2.87 mg/dL — ABNORMAL HIGH (ref 0.44–1.00)
GFR calc Af Amer: 18 mL/min — ABNORMAL LOW (ref >60)
GFR calc non Af Amer: 16 mL/min — ABNORMAL LOW (ref >60)
Glucose, Bld: 147 mg/dL — ABNORMAL HIGH (ref 70–99)
Potassium: 4.8 mmol/L (ref 3.5–5.1)
Sodium: 138 mmol/L (ref 135–145)

## 2019-10-05 LAB — CBC
HCT: 31 % — ABNORMAL LOW (ref 36.0–46.0)
Hemoglobin: 10.6 g/dL — ABNORMAL LOW (ref 12.0–15.0)
MCH: 30.5 pg (ref 26.0–34.0)
MCHC: 34.2 g/dL (ref 30.0–36.0)
MCV: 89.1 fL (ref 80.0–100.0)
Platelets: 179 10*3/uL (ref 150–400)
RBC: 3.48 MIL/uL — ABNORMAL LOW (ref 3.87–5.11)
RDW: 14.2 % (ref 11.5–15.5)
WBC: 14.9 10*3/uL — ABNORMAL HIGH (ref 4.0–10.5)
nRBC: 0 % (ref 0.0–0.2)

## 2019-10-05 LAB — PROTEIN / CREATININE RATIO, URINE
Creatinine, Urine: 121 mg/dL
Protein Creatinine Ratio: 0.67 mg/mg{Cre} — ABNORMAL HIGH (ref 0.00–0.15)
Total Protein, Urine: 81 mg/dL

## 2019-10-05 LAB — HEPATITIS C ANTIBODY: HCV Ab: NONREACTIVE

## 2019-10-05 LAB — HEPATITIS B SURFACE ANTIGEN: Hepatitis B Surface Ag: NONREACTIVE

## 2019-10-05 LAB — HEPATITIS B SURFACE ANTIBODY,QUALITATIVE: Hep B S Ab: NONREACTIVE

## 2019-10-05 LAB — HIV ANTIBODY (ROUTINE TESTING W REFLEX): HIV Screen 4th Generation wRfx: NONREACTIVE

## 2019-10-05 LAB — GLUCOSE, CAPILLARY
Glucose-Capillary: 148 mg/dL — ABNORMAL HIGH (ref 70–99)
Glucose-Capillary: 205 mg/dL — ABNORMAL HIGH (ref 70–99)
Glucose-Capillary: 183 mg/dL — ABNORMAL HIGH (ref 70–99)
Glucose-Capillary: 149 mg/dL — ABNORMAL HIGH (ref 70–99)

## 2019-10-05 LAB — HEPATITIS B CORE ANTIBODY, IGM: Hep B C IgM: NONREACTIVE

## 2019-10-05 LAB — PROCALCITONIN: Procalcitonin: 2.94 ng/mL

## 2019-10-05 MED ORDER — INSULIN GLARGINE 100 UNIT/ML ~~LOC~~ SOLN
8.0000 [IU] | Freq: Every day | SUBCUTANEOUS | Status: DC
  Administered 2019-10-05 – 2019-10-06 (×2): 8 [IU] via SUBCUTANEOUS
  Filled 2019-10-05 (×3): qty 0.08

## 2019-10-05 MED ORDER — DOXYCYCLINE HYCLATE 100 MG PO TABS
100.0000 mg | ORAL_TABLET | Freq: Two times a day (BID) | ORAL | Status: DC
  Administered 2019-10-05 – 2019-10-07 (×5): 100 mg via ORAL
  Filled 2019-10-05 (×5): qty 1

## 2019-10-05 MED ORDER — AMOXICILLIN-POT CLAVULANATE 500-125 MG PO TABS
1.0000 | ORAL_TABLET | Freq: Two times a day (BID) | ORAL | Status: DC
  Administered 2019-10-05 – 2019-10-07 (×5): 500 mg via ORAL
  Filled 2019-10-05 (×6): qty 1

## 2019-10-05 MED ORDER — SODIUM ZIRCONIUM CYCLOSILICATE 10 G PO PACK
10.0000 g | PACK | Freq: Every day | ORAL | Status: DC
  Administered 2019-10-05: 10 g via ORAL
  Filled 2019-10-05 (×2): qty 1

## 2019-10-05 NOTE — Progress Notes (Signed)
PT Cancellation Note  Patient Details Name: [name] [name] MRN: [id number] DOB: Apr 17, 1945   Cancelled Treatment:    Reason Eval/Treat Not Completed: Fatigue/lethargy limiting ability to participate (Attempted session, pt now back in room ,reports to be too tired, desires rest. Pt appears drowsy. Will attempt again next day.)  4:18 PM, 10/05/19 Etta Grandchild, PT, DPT Physical Therapist - [facility]  [phone] ([place])   [place] C 10/05/2019, 4:18 PM

## 2019-10-05 NOTE — Progress Notes (Signed)
Patient ID: [name] [name], female   DOB: 1946-03-02, 74 y.o.   MRN: [id number] [facility] PROGRESS NOTE  TAVI GAUGHRAN JOA:[id number] DOB: 19-Dec-1945 DOA: 10/03/2019 PCP: Marguerita Merles, MD  HPI/Subjective: Patient initially came in with hip pain and found to have an elevated white blood cell count and was admitted to the hospital and placed on antibiotics for suspected diabetic foot ulcer.  Patient states her appetite is not great.  No nausea or vomiting.  Abdominal pain better today.  Still having some right leg pain.  Also having left rib pain.  Objective: Vitals:   10/04/19 2359 10/05/19 0743  BP: 136/62 (!) 169/79  Pulse: (!) 50 78  Resp: 18 16  Temp: 98.7 F (37.1 C) 98 F (36.7 C)  SpO2: 95% 99%    Intake/Output Summary (Last 24 hours) at 10/05/2019 1308 Last data filed at 10/05/2019 1030 Gross per 24 hour  Intake 929.31 ml  Output 700 ml  Net 229.31 ml   Filed Weights   10/03/19 1646 10/04/19 0329  Weight: 93.9 kg 96.9 kg    ROS: Review of Systems  Respiratory: Negative for shortness of breath.   Cardiovascular: Positive for chest pain.  Gastrointestinal: Positive for abdominal pain. Negative for nausea and vomiting.  Musculoskeletal: Positive for joint pain.   Exam: Physical Exam HENT:     Nose: No mucosal edema.     Mouth/Throat:     Pharynx: No oropharyngeal exudate.  Eyes:     General: Lids are normal.     Conjunctiva/sclera: Conjunctivae normal.     Pupils: Pupils are equal, round, and reactive to light.  Cardiovascular:     Rate and Rhythm: Normal rate and regular rhythm.     Heart sounds: Normal heart sounds, S1 normal and S2 normal. No murmur heard.   Pulmonary:     Breath sounds: No wheezing, rhonchi or rales.  Abdominal:     Palpations: Abdomen is soft.     Tenderness: There is abdominal tenderness in the right upper quadrant.  Musculoskeletal:     Right ankle: No swelling.     Left ankle: No swelling.  Skin:    General: Skin is  warm.     Comments: Right diabetic foot ulcer covered today.  Please see picture from yesterday  Neurological:     Mental Status: She is alert.     Cranial Nerves: No cranial nerve deficit.       Data Reviewed: Basic Metabolic Panel: Recent Labs  Lab 10/03/19 1720 10/04/19 0503 10/05/19 0313  NA 140 137 138  K 4.7 5.5* 4.8  CL 111 108 108  CO2 17* 18* 20*  GLUCOSE 157* 204* 147*  BUN 35* 41* 50*  CREATININE 2.26* 2.53* 2.87*  CALCIUM 9.1 8.7* 8.3*   Liver Function Tests: Recent Labs  Lab 10/03/19 1720  AST 24  ALT 16  ALKPHOS 96  BILITOT 0.9  PROT 8.0  ALBUMIN 3.7   CBC: Recent Labs  Lab 10/03/19 1649 10/04/19 0503 10/05/19 0313  WBC 21.9* 19.0* 14.9*  NEUTROABS 18.5*  --   --   HGB 11.9* 10.7* 10.6*  HCT 34.5* 32.7* 31.0*  MCV 88.9 92.6 89.1  PLT 204 187 179   Cardiac Enzymes: Recent Labs  Lab 10/04/19 0503  CKTOTAL 208    CBG: Recent Labs  Lab 10/04/19 1130 10/04/19 1619 10/04/19 2031 10/05/19 0742 10/05/19 1138  GLUCAP 165* 98 162* 148* 205*    Recent Results (from the past 240 hour(s))  Blood culture (routine x 2)     Status: None (Preliminary result)   Collection Time: 10/03/19  8:13 PM   Specimen: BLOOD  Result Value Ref Range Status   Specimen Description BLOOD LEFT HAND  Final   Special Requests   Final    BOTTLES DRAWN AEROBIC AND ANAEROBIC Blood Culture results may not be optimal due to an inadequate volume of blood received in culture bottles   Culture   Final    NO GROWTH 2 DAYS Performed at [facility], [address], [place], [place] [address]    Report Status PENDING  Incomplete  Blood culture (routine x 2)     Status: None (Preliminary result)   Collection Time: 10/03/19  8:13 PM   Specimen: BLOOD  Result Value Ref Range Status   Specimen Description BLOOD LEFT [facility]  Final   Special Requests   Final    BOTTLES DRAWN AEROBIC AND ANAEROBIC Blood Culture adequate volume   Culture   Final    NO GROWTH 2  DAYS Performed at [facility], [address], [place], [place] [address]    Report Status PENDING  Incomplete  Urine culture     Status: None   Collection Time: 10/03/19 10:18 PM   Specimen: Urine, Random  Result Value Ref Range Status   Specimen Description   Final    URINE, RANDOM Performed at [facility], [address], [place], [place] [address]    Special Requests   Final    NONE Performed at [facility], [address] [place], [place] [address]    Culture   Final    NO GROWTH Performed at [facility], [place] [address], [place], [place] [address]    Report Status 10/05/2019 FINAL  Final  Resp Panel by RT PCR (RSV, Flu A&B, Covid) - Nasopharyngeal Swab     Status: None   Collection Time: 10/04/19 12:47 AM   Specimen: Nasopharyngeal Swab  Result Value Ref Range Status   SARS Coronavirus 2 by RT PCR NEGATIVE NEGATIVE Final    Comment: (NOTE) SARS-CoV-2 target nucleic acids are NOT DETECTED.  The SARS-CoV-2 RNA is generally detectable in upper respiratoy specimens during the acute phase of infection. The lowest concentration of SARS-CoV-2 viral copies this assay can detect is 131 copies/mL. A negative result does not preclude SARS-Cov-2 infection and should not be used as the sole basis for treatment or other patient management decisions. A negative result may occur with  improper specimen collection/handling, submission of specimen other than nasopharyngeal swab, presence of viral mutation(s) within the areas targeted by this assay, and inadequate number of viral copies (<131 copies/mL). A negative result must be combined with clinical observations, patient history, and epidemiological information. The expected result is Negative.  Fact Sheet for Patients:  PinkCheek.be  Fact Sheet for Healthcare Providers:  GravelBags.it  This test is no t yet approved or cleared by  the [place] FDA and  has been authorized for detection and/or diagnosis of SARS-CoV-2 by FDA under an Emergency Use Authorization (EUA). This EUA will remain  in effect (meaning this test can be used) for the duration of the COVID-19 declaration under Section 564(b)(1) of the Act, 21 U.S.C. section 360bbb-3(b)(1), unless the authorization is terminated or revoked sooner.     Influenza A by PCR NEGATIVE NEGATIVE Final   Influenza B by PCR NEGATIVE NEGATIVE Final    Comment: (NOTE) The Xpert Xpress SARS-CoV-2/FLU/RSV assay is intended as  an aid in  the diagnosis of influenza from Nasopharyngeal swab specimens and  should not be used as a sole basis for treatment. Nasal washings and  aspirates are unacceptable for Xpert Xpress SARS-CoV-2/FLU/RSV  testing.  Fact Sheet for Patients: PinkCheek.be  Fact Sheet for Healthcare Providers: GravelBags.it  This test is not yet approved or cleared by the [place] FDA and  has been authorized for detection and/or diagnosis of SARS-CoV-2 by  FDA under an Emergency Use Authorization (EUA). This EUA will remain  in effect (meaning this test can be used) for the duration of the  Covid-19 declaration under Section 564(b)(1) of the Act, 21  U.S.C. section 360bbb-3(b)(1), unless the authorization is  terminated or revoked.    Respiratory Syncytial Virus by PCR NEGATIVE NEGATIVE Final    Comment: (NOTE) Fact Sheet for Patients: PinkCheek.be  Fact Sheet for Healthcare Providers: GravelBags.it  This test is not yet approved or cleared by the [place] FDA and  has been authorized for detection and/or diagnosis of SARS-CoV-2 by  FDA under an Emergency Use Authorization (EUA). This EUA will remain  in effect (meaning this test can be used) for the duration of the  COVID-19 declaration under Section 564(b)(1) of the Act, 21  U.S.C.  section 360bbb-3(b)(1), unless the authorization is terminated or  revoked. Performed at [facility], [address] [place], [place] [address]      Studies: DG Chest 1 View  Result Date: 10/03/2019 CLINICAL DATA:  Cough EXAM: CHEST  1 VIEW COMPARISON:  05/13/2019 FINDINGS: Mild cardiomegaly. Lungs clear. No effusions. No acute bony abnormality. IMPRESSION: No active disease. Electronically Signed   By: Rolm Baptise M.D.   On: 10/03/2019 23:00   CT PELVIS WO CONTRAST  Result Date: 10/03/2019 CLINICAL DATA:  Right hip pain following a fall this afternoon. EXAM: CT PELVIS WITHOUT CONTRAST TECHNIQUE: Multidetector CT imaging of the pelvis was performed following the standard protocol without intravenous contrast. COMPARISON:  Pelvis and right hip radiographs obtained earlier today. Abdomen and pelvis CT dated 04/20/2013. FINDINGS: Urinary Tract: Mild diffuse bladder wall thickening. No bladder or ureteral calculi seen. Bowel: Mildly prominent stool in the rectum and distal sigmoid colon. No dilated bowel loops. Vascular/Lymphatic: Atheromatous arterial calcifications without aneurysm. No enlarged lymph nodes. Reproductive:  Unremarkable uterus. No adnexal masses. Other:  None. Musculoskeletal: No pelvic or hip fracture or dislocation. Lower lumbar spine degenerative changes. IMPRESSION: 1. No fracture or dislocation. 2. Mild diffuse bladder wall thickening, possibly due to cystitis. Electronically Signed   By: Claudie Revering M.D.   On: 10/03/2019 18:35   US Venous Img Lower Bilateral (DVT)  Result Date: 10/04/2019 CLINICAL DATA:  Bilateral leg and knee pain. EXAM: BILATERAL LOWER EXTREMITY VENOUS DOPPLER ULTRASOUND TECHNIQUE: Gray-scale sonography with compression, as well as color and duplex ultrasound, were performed to evaluate the deep venous system(s) from the level of the common femoral vein through the popliteal and proximal calf veins. COMPARISON:  None recent FINDINGS:  VENOUS Normal compressibility of the common femoral, superficial femoral, and popliteal veins, as well as the visualized calf veins. Visualized portions of profunda femoral vein and great saphenous vein unremarkable. No filling defects to suggest DVT on grayscale or color Doppler imaging. Doppler waveforms show normal direction of venous flow, normal respiratory plasticity and response to augmentation. OTHER None. Limitations: none IMPRESSION: Negative. Electronically Signed   By: Constance Holster M.D.   On: 10/04/2019 16:12   DG Foot Complete Right  Result Date: 10/03/2019 CLINICAL  DATA:  Ulcer on the medial side of the heel. EXAM: RIGHT FOOT COMPLETE - 3+ VIEW COMPARISON:  April 24, 2018 FINDINGS: Again noted are advanced degenerative changes of the first metatarsophalangeal joint. There is an old healed fracture of the fourth metatarsal head. Again noted is a deformity of the midfoot and hindfoot as previously seen. There is a plantar ulcer with overlying soft tissue edema. There is no definite radiographic evidence for osteomyelitis. IMPRESSION: 1. Plantar ulcer without definite radiographic evidence for osteomyelitis. 2. Stable mid and hindfoot deformity. Electronically Signed   By: Constance Holster M.D.   On: 10/03/2019 23:51   DG Hip Unilat W or Wo Pelvis 2-3 Views Right  Result Date: 10/03/2019 CLINICAL DATA:  Fall, right hip pain EXAM: DG HIP (WITH OR WITHOUT PELVIS) 2-3V RIGHT COMPARISON:  None. FINDINGS: No visible hip fracture, subluxation or dislocation. Mild degenerative changes in the hips bilaterally. SI joints symmetric and unremarkable. IMPRESSION: No visible acute bony abnormality. Electronically Signed   By: Rolm Baptise M.D.   On: 10/03/2019 17:47   US Abdomen Limited RUQ  Result Date: 10/04/2019 CLINICAL DATA:  Pain EXAM: ULTRASOUND ABDOMEN LIMITED RIGHT UPPER QUADRANT COMPARISON:  Ultrasound dated April 21, 2013. FINDINGS: Gallbladder: The gallbladder is distended with a  small amount of gallbladder sludge. There is no evidence for cholelithiasis. There is no gallbladder wall thickening. The sonographic Percell Miller sign is negative. Common bile duct: Diameter: 4 mm Liver: Diffuse increased echogenicity with slightly heterogeneous liver. Appearance typically secondary to fatty infiltration. Fibrosis secondary consideration. No secondary findings of cirrhosis noted. No focal hepatic lesion or intrahepatic biliary duct dilatation. Portal vein is patent on color Doppler imaging with normal direction of blood flow towards the liver. Other: None. IMPRESSION: 1. Distended gallbladder without evidence for cholelithiasis or acute cholecystitis. 2. Hepatic steatosis. 3. There is a small amount of gallbladder sludge within the gallbladder lumen. Electronically Signed   By: Constance Holster M.D.   On: 10/04/2019 16:13    Scheduled Meds: . amLODipine  10 mg Oral Daily  . amoxicillin-clavulanate  1 tablet Oral Q12H  . aspirin  81 mg Oral Daily  . atorvastatin  40 mg Oral QPM  . donepezil  10 mg Oral QHS  . doxycycline  100 mg Oral Q12H  . enoxaparin (LOVENOX) injection  30 mg Subcutaneous Q24H  . insulin aspart  0-15 Units Subcutaneous TID WC  . insulin aspart  0-5 Units Subcutaneous QHS  . levothyroxine  50 mcg Oral Q0600  . sodium zirconium cyclosilicate  10 g Oral Daily   Continuous Infusions: . sodium chloride 75 mL/hr at 10/05/19 0913    Assessment/Plan:  1. Acute kidney injury on chronic kidney disease stage IV with hyperkalemia.  Potassium better today with starting Lokelma.  Will decrease to once a day dosing.  Creatinine worse today at 2.87.  Appreciate nephrology consultation.  Renal ultrasound ordered.  Increase IV fluids to 75 cc/h.  Holding lisinopril. 2. Diabetic foot ulcer with leukocytosis.  X-ray did not show any signs of osteomyelitis.  Sedimentation rate slightly elevated 6.5.  Procalcitonin elevated at 2.94.  White blood cell count coming down from  21.9-14.9.  Change antibiotics over to oral with Augmentin and doxycycline. 3. Type 2 diabetes mellitus with chronic kidney disease stage IV.  Since sugars are starting to come up a little bit I will start long-acting Lantus insulin 8 units this evening.  Sliding scale insulin. 4. Right hip pain and right leg pain.  CT scan of  the pelvis negative for fracture and ultrasound negative for DVT.  Physical therapy evaluation. 5. Right upper quadrant pain.  Right upper quadrant ultrasound did not show any signs of acute cholecystitis the patient did have hepatic steatosis and a small amount of gallbladder sludge. 6. Left rib pain.  X-ray of the left ribs and check a serum protein electrophoresis 7. Hyperlipidemia unspecified on Lipitor 8. Hypothyroidism unspecified on levothyroxine 9. Mild memory loss on Aricept    Code Status:     Code Status Orders  (From admission, onward)         Start     Ordered   10/04/19 0004  Full code  Continuous        10/04/19 0006        Code Status History    Date Active Date Inactive Code Status Order ID Comments User Context   04/25/2018 0211 04/27/2018 1756 Full Code [id number]  Mayo, Pete Pelt, MD Inpatient   Advance Care Planning Activity    Advance Directive Documentation     Most Recent Value  Type of Advance Directive Living will  Pre-existing out of facility DNR order (yellow form or pink MOST form) --  "MOST" Form in Place? --     Family Communication: Son at bedside Disposition Plan: Status is: Observation  Dispo: The patient is from: Home              Anticipated d/c is to: Home health              Anticipated d/c date is: We will have to see how the patient's creatinine trends prior to making a disposition.  Likely will need a few more days in the hospital.              Patient currently having her creatinine increased on a daily basis.  Need to see creatinine peak and trend better prior to  disposition.  Consultants:  Nephrology  Podiatry  Antibiotics: -Augmentin and doxycycline  Time spent: 28 minutes  [place]

## 2019-10-05 NOTE — Consult Note (Signed)
[facility]  CONSULT NOTE    Date: 10/05/2019                  Patient Name:  [name] [name]  MRN: [id number]  DOB: Aug 10, 1945  Age / Sex: 74 y.o., female         PCP: Marguerita Merles, MD                 Service Requesting Consult: Dr. Earleen Newport                 Reason for Consult: Acute renal failure            History of Present Illness: [name] [name]  Admitted after a fall out of her bed. Found to have right foot diabetic foot ulcer and leukocytosis. Patient started on IV antibiotics. MRI with no signs of osteomyelitis. Patient with acute renal failure on chronic kidney disease. She was previously followed by [facility], last seen in 2017. Patient states she feels weak, tired and has no appetite.    Medications: Outpatient medications: Medications Prior to Admission  Medication Sig Dispense Refill Last Dose  . amLODipine (NORVASC) 10 MG tablet Take 1 tablet by mouth daily.   unknown at unknown  . ASPIRIN 81 PO Take by mouth daily.   unknown at unknown  . atorvastatin (LIPITOR) 40 MG tablet Take 1 tablet by mouth daily.   unknown at unknown  . carvedilol (COREG) 25 MG tablet Take 1 tablet by mouth 2 (two) times daily.   unknown at unknown  . donepezil (ARICEPT) 10 MG tablet Take 10 mg by mouth daily.   unknown at unknown  . insulin aspart protamine- aspart (NOVOLOG MIX 70/30) (70-30) 100 UNIT/ML injection Inject 15 Units into the skin daily with breakfast.   10/04/2019 at Unknown time  . predniSONE (DELTASONE) 10 MG tablet Take 50 mg by mouth daily.   unknown at unknown  . SYNTHROID 50 MCG tablet Take 1 tablet by mouth daily.   unknown at unknown  . TRESIBA FLEXTOUCH 100 UNIT/ML SOPN FlexTouch Pen Inject 80 Units into the skin at bedtime.    Past Week at Unknown time    Current medications: Current Facility-Administered Medications  Medication Dose Route Frequency Provider Last Rate Last Admin  . 0.9 %  sodium chloride infusion   Intravenous  Continuous Loletha Grayer, MD 75 mL/hr at 10/05/19 1704 New Bag at 10/05/19 1704  . acetaminophen (TYLENOL) tablet 650 mg  650 mg Oral Q6H PRN Athena Masse, MD       Or  . acetaminophen (TYLENOL) suppository 650 mg  650 mg Rectal Q6H PRN Athena Masse, MD      . amLODipine (NORVASC) tablet 10 mg  10 mg Oral Daily Loletha Grayer, MD   10 mg at 10/05/19 0905  . amoxicillin-clavulanate (AUGMENTIN) 500-125 MG per tablet 500 mg  1 tablet Oral Q12H Loletha Grayer, MD   500 mg at 10/05/19 0906  . aspirin chewable tablet 81 mg  81 mg Oral Daily Loletha Grayer, MD   81 mg at 10/05/19 0906  . atorvastatin (LIPITOR) tablet 40 mg  40 mg Oral QPM Loletha Grayer, MD   40 mg at 10/05/19 1701  . donepezil (ARICEPT) tablet 10 mg  10 mg Oral QHS Loletha Grayer, MD   10 mg at 10/05/19 0002  . doxycycline (VIBRA-TABS) tablet 100 mg  100 mg Oral Q12H Loletha Grayer, MD   100 mg at 10/05/19  0355  . enoxaparin (LOVENOX) injection 30 mg  30 mg Subcutaneous Q24H Judd Gaudier V, MD   30 mg at 10/05/19 0905  . insulin aspart (novoLOG) injection 0-15 Units  0-15 Units Subcutaneous TID WC Athena Masse, MD   3 Units at 10/05/19 1701  . insulin aspart (novoLOG) injection 0-5 Units  0-5 Units Subcutaneous QHS Judd Gaudier V, MD      . insulin glargine (LANTUS) injection 8 Units  8 Units Subcutaneous QHS Wieting, Richard, MD      . levothyroxine (SYNTHROID) tablet 50 mcg  50 mcg Oral Q0600 Loletha Grayer, MD   50 mcg at 10/05/19 0617  . ondansetron (ZOFRAN) tablet 4 mg  4 mg Oral Q6H PRN Athena Masse, MD       Or  . ondansetron [facility]) injection 4 mg  4 mg Intravenous Q6H PRN Athena Masse, MD      . senna-docusate (Senokot-S) tablet 1 tablet  1 tablet Oral QHS PRN Athena Masse, MD      . sodium zirconium cyclosilicate (LOKELMA) packet 10 g  10 g Oral Daily Loletha Grayer, MD   10 g at 10/05/19 9741      Allergies: No Known Allergies    Past Medical History: Past Medical History:   Diagnosis Date  . CHF (congestive heart failure) ([place])   . Diabetes mellitus without complication ([place])   . Humerus fracture 04/20/2018   left  . Hyperlipidemia   . Hypertension   . Hypothyroidism   . Vertigo    several yrs ago  . Wears dentures    partial upper and lower (loose)     Past Surgical History: Past Surgical History:  Procedure Laterality Date  . CATARACT EXTRACTION W/PHACO Left 12/01/2018   Procedure: CATARACT EXTRACTION PHACO AND INTRAOCULAR LENS PLACEMENT ([place]) LEFT DIABETIC;  Surgeon: Birder Robson, MD;  Location: [place];  Service: Ophthalmology;  Laterality: Left;  Diabetic - insulin  . COLONOSCOPY WITH PROPOFOL N/A 07/07/2019   Procedure: COLONOSCOPY WITH PROPOFOL;  Surgeon: Robert Bellow, MD;  Location: [facility];  Service: Endoscopy;  Laterality: N/A;  . ESOPHAGOGASTRODUODENOSCOPY (EGD) WITH PROPOFOL N/A 07/07/2019   Procedure: ESOPHAGOGASTRODUODENOSCOPY (EGD) WITH PROPOFOL;  Surgeon: Robert Bellow, MD;  Location: [facility] ENDOSCOPY;  Service: Endoscopy;  Laterality: N/A;  . FOOT SURGERY Right    lesion excision     Family History: Family History  Problem Relation Age of Onset  . Breast cancer Neg Hx      Social History: Social History   Socioeconomic History  . Marital status: Divorced    Spouse name: Not on file  . Number of children: Not on file  . Years of education: Not on file  . Highest education level: Not on file  Occupational History  . Not on file  Tobacco Use  . Smoking status: Never Smoker  . Smokeless tobacco: Never Used  Vaping Use  . Vaping Use: Never used  Substance and Sexual Activity  . Alcohol use: No  . Drug use: Never  . Sexual activity: Not on file  Other Topics Concern  . Not on file  Social History Narrative  . Not on file   Social Determinants of Health   Financial Resource Strain:   . Difficulty of Paying Living Expenses:   Food Insecurity:   . Worried About Charity fundraiser  in the Last Year:   . Arboriculturist in the Last Year:   Transportation Needs:   .  Lack of Transportation (Medical):   Marland Kitchen Lack of Transportation (Non-Medical):   Physical Activity:   . Days of Exercise per Week:   . Minutes of Exercise per Session:   Stress:   . Feeling of Stress :   Social Connections:   . Frequency of Communication with Friends and Family:   . Frequency of Social Gatherings with Friends and Family:   . Attends Religious Services:   . Active Member of Clubs or Organizations:   . Attends Archivist Meetings:   Marland Kitchen Marital Status:   Intimate Partner Violence:   . Fear of Current or Ex-Partner:   . Emotionally Abused:   Marland Kitchen Physically Abused:   . Sexually Abused:      Review of Systems: Review of Systems  Constitutional: Positive for chills, diaphoresis, fever, malaise/fatigue and weight loss.  HENT: Negative.  Negative for congestion, ear discharge, ear pain, hearing loss, nosebleeds, sinus pain, sore throat and tinnitus.   Eyes: Negative.  Negative for blurred vision, double vision, photophobia, pain, discharge and redness.  Respiratory: Negative.  Negative for cough, hemoptysis, sputum production, shortness of breath, wheezing and stridor.   Cardiovascular: Negative.  Negative for chest pain, palpitations, orthopnea, claudication, leg swelling and PND.  Gastrointestinal: Negative.  Negative for abdominal pain, blood in stool, constipation, diarrhea, heartburn, melena, nausea and vomiting.  Genitourinary: Negative.  Negative for dysuria, flank pain, frequency, hematuria and urgency.  Musculoskeletal:       Right foot pain  Skin: Negative.  Negative for itching and rash.       Right foot ulcer  Neurological: Negative.   Endo/Heme/Allergies: Negative.   Psychiatric/Behavioral: Negative.     Vital Signs: Blood pressure (!) 169/79, pulse 78, temperature 98 F (36.7 C), temperature source Oral, resp. rate 16, height 5\' 7"  (1.702 m), weight 96.9 kg, SpO2  99 %.  Weight trends: Filed Weights   10/03/19 1646 10/04/19 0329  Weight: 93.9 kg 96.9 kg    Physical Exam: General: NAD,   Head: Normocephalic, atraumatic. Moist oral mucosal membranes  Eyes: Anicteric, PERRL  Neck: Supple, trachea midline  Lungs:  Clear to auscultation  Heart: Regular rate and rhythm  Abdomen:  Soft, nontender,   Extremities:  no peripheral edema.  Neurologic: Nonfocal, moving all four extremities  Skin: No lesions         Lab results: Basic Metabolic Panel: Recent Labs  Lab 10/03/19 1720 10/04/19 0503 10/05/19 0313  NA 140 137 138  K 4.7 5.5* 4.8  CL 111 108 108  CO2 17* 18* 20*  GLUCOSE 157* 204* 147*  BUN 35* 41* 50*  CREATININE 2.26* 2.53* 2.87*  CALCIUM 9.1 8.7* 8.3*    Liver Function Tests: Recent Labs  Lab 10/03/19 1720  AST 24  ALT 16  ALKPHOS 96  BILITOT 0.9  PROT 8.0  ALBUMIN 3.7   No results for input(s): LIPASE, AMYLASE in the last 168 hours. No results for input(s): AMMONIA in the last 168 hours.  CBC: Recent Labs  Lab 10/03/19 1649 10/04/19 0503 10/05/19 0313  WBC 21.9* 19.0* 14.9*  NEUTROABS 18.5*  --   --   HGB 11.9* 10.7* 10.6*  HCT 34.5* 32.7* 31.0*  MCV 88.9 92.6 89.1  PLT 204 187 179    Cardiac Enzymes: Recent Labs  Lab 10/04/19 0503  CKTOTAL 208    BNP: Invalid input(s): POCBNP  CBG: Recent Labs  Lab 10/04/19 1619 10/04/19 2031 10/05/19 0742 10/05/19 1138 10/05/19 1549  GLUCAP 98 162* 148* 205*  56*    Microbiology: Results for orders placed or performed during the hospital encounter of 10/03/19  Blood culture (routine x 2)     Status: None (Preliminary result)   Collection Time: 10/03/19  8:13 PM   Specimen: BLOOD  Result Value Ref Range Status   Specimen Description BLOOD LEFT HAND  Final   Special Requests   Final    BOTTLES DRAWN AEROBIC AND ANAEROBIC Blood Culture results may not be optimal due to an inadequate volume of blood received in culture bottles   Culture   Final     NO GROWTH 2 DAYS Performed at [facility], [address] [place], [place] [address]    Report Status PENDING  Incomplete  Blood culture (routine x 2)     Status: None (Preliminary result)   Collection Time: 10/03/19  8:13 PM   Specimen: BLOOD  Result Value Ref Range Status   Specimen Description BLOOD LEFT [facility]  Final   Special Requests   Final    BOTTLES DRAWN AEROBIC AND ANAEROBIC Blood Culture adequate volume   Culture   Final    NO GROWTH 2 DAYS Performed at [facility], [address], [place], [place] [address]    Report Status PENDING  Incomplete  Urine culture     Status: None   Collection Time: 10/03/19 10:18 PM   Specimen: Urine, Random  Result Value Ref Range Status   Specimen Description   Final    URINE, RANDOM Performed at [facility], [address], [place], [place] [address]    Special Requests   Final    NONE Performed at [facility], [address], [place], [place] [address]    Culture   Final    NO GROWTH Performed at [facility], [place] [address], [place], [place] [address]    Report Status 10/05/2019 FINAL  Final  Resp Panel by RT PCR (RSV, Flu A&B, Covid) - Nasopharyngeal Swab     Status: None   Collection Time: 10/04/19 12:47 AM   Specimen: Nasopharyngeal Swab  Result Value Ref Range Status   SARS Coronavirus 2 by RT PCR NEGATIVE NEGATIVE Final    Comment: (NOTE) SARS-CoV-2 target nucleic acids are NOT DETECTED.  The SARS-CoV-2 RNA is generally detectable in upper respiratoy specimens during the acute phase of infection. The lowest concentration of SARS-CoV-2 viral copies this assay can detect is 131 copies/mL. A negative result does not preclude SARS-Cov-2 infection and should not be used as the sole basis for treatment or other patient management decisions. A negative result may occur with  improper specimen collection/handling, submission of specimen other than nasopharyngeal swab, presence  of viral mutation(s) within the areas targeted by this assay, and inadequate number of viral copies (<131 copies/mL). A negative result must be combined with clinical observations, patient history, and epidemiological information. The expected result is Negative.  Fact Sheet for Patients:  PinkCheek.be  Fact Sheet for Healthcare Providers:  GravelBags.it  This test is no t yet approved or cleared by the [place] FDA and  has been authorized for detection and/or diagnosis of SARS-CoV-2 by FDA under an Emergency Use Authorization (EUA). This EUA will remain  in effect (meaning this test can be used) for the duration of the COVID-19 declaration under Section 564(b)(1) of the Act, 21 U.S.C. section 360bbb-3(b)(1), unless the authorization is terminated or revoked sooner.     Influenza A by PCR NEGATIVE NEGATIVE Final   Influenza B  by PCR NEGATIVE NEGATIVE Final    Comment: (NOTE) The Xpert Xpress SARS-CoV-2/FLU/RSV assay is intended as an aid in  the diagnosis of influenza from Nasopharyngeal swab specimens and  should not be used as a sole basis for treatment. Nasal washings and  aspirates are unacceptable for Xpert Xpress SARS-CoV-2/FLU/RSV  testing.  Fact Sheet for Patients: PinkCheek.be  Fact Sheet for Healthcare Providers: GravelBags.it  This test is not yet approved or cleared by the [place] FDA and  has been authorized for detection and/or diagnosis of SARS-CoV-2 by  FDA under an Emergency Use Authorization (EUA). This EUA will remain  in effect (meaning this test can be used) for the duration of the  Covid-19 declaration under Section 564(b)(1) of the Act, 21  U.S.C. section 360bbb-3(b)(1), unless the authorization is  terminated or revoked.    Respiratory Syncytial Virus by PCR NEGATIVE NEGATIVE Final    Comment: (NOTE) Fact Sheet for  Patients: PinkCheek.be  Fact Sheet for Healthcare Providers: GravelBags.it  This test is not yet approved or cleared by the [place] FDA and  has been authorized for detection and/or diagnosis of SARS-CoV-2 by  FDA under an Emergency Use Authorization (EUA). This EUA will remain  in effect (meaning this test can be used) for the duration of the  COVID-19 declaration under Section 564(b)(1) of the Act, 21 U.S.C.  section 360bbb-3(b)(1), unless the authorization is terminated or  revoked. Performed at [facility], [address] [place], [place] [address]     Coagulation Studies: No results for input(s): LABPROT, INR in the last 72 hours.  Urinalysis: Recent Labs    10/03/19 2218  COLORURINE YELLOW*  LABSPEC 1.014  PHURINE 5.0  GLUCOSEU NEGATIVE  HGBUR NEGATIVE  BILIRUBINUR NEGATIVE  KETONESUR NEGATIVE  PROTEINUR 100*  NITRITE NEGATIVE  LEUKOCYTESUR NEGATIVE      Imaging: DG Chest 1 View  Result Date: 10/03/2019 CLINICAL DATA:  Cough EXAM: CHEST  1 VIEW COMPARISON:  05/13/2019 FINDINGS: Mild cardiomegaly. Lungs clear. No effusions. No acute bony abnormality. IMPRESSION: No active disease. Electronically Signed   By: Rolm Baptise M.D.   On: 10/03/2019 23:00   DG Ribs Unilateral Left  Result Date: 10/05/2019 CLINICAL DATA:  Left rib pain.  No injury. EXAM: LEFT RIBS - 2 VIEW COMPARISON:  10/03/2019. FINDINGS: No acute or focal bony abnormality identified. No evidence of displaced rib fracture or pneumothorax. Degenerative change thoracic spine. Cardiomegaly again noted. Bibasilar atelectasis. IMPRESSION: 1. No evidence of displaced rib fracture or focal bony abnormality identified. No pneumothorax. 2.  Cardiomegaly again noted.  Bibasilar atelectasis. Electronically Signed   By: Marcello Moores  Register   On: 10/05/2019 13:52   US RENAL  Result Date: 10/05/2019 CLINICAL DATA:  Chronic kidney disease stage 4  EXAM: RENAL / URINARY TRACT ULTRASOUND COMPLETE COMPARISON:  None. FINDINGS: Right Kidney: Renal measurements: 9.5 x 6.0 x 5.1 cm = volume: 153 mL. Cortical thinning. Increased echotexture. No mass or hydronephrosis. Small calcifications, likely nonobstructing stones. Left Kidney: Renal measurements: 9.5 x 4.6 x 5.1 cm = volume: 116 mL. Cortical thinning with increased echotexture. No mass or hydronephrosis. Small calcifications, likely nonobstructing stones. Bladder: Appears normal for degree of bladder distention. Other: None. IMPRESSION: No acute findings.  No hydronephrosis. Cortical thinning and increased echotexture. Bilateral nonobstructing nephrolithiasis. Electronically Signed   By: Rolm Baptise M.D.   On: 10/05/2019 15:25   US Venous Img Lower Bilateral (DVT)  Result Date: 10/04/2019 CLINICAL DATA:  Bilateral leg and knee pain. EXAM:  BILATERAL LOWER EXTREMITY VENOUS DOPPLER ULTRASOUND TECHNIQUE: Gray-scale sonography with compression, as well as color and duplex ultrasound, were performed to evaluate the deep venous system(s) from the level of the common femoral vein through the popliteal and proximal calf veins. COMPARISON:  None recent FINDINGS: VENOUS Normal compressibility of the common femoral, superficial femoral, and popliteal veins, as well as the visualized calf veins. Visualized portions of profunda femoral vein and great saphenous vein unremarkable. No filling defects to suggest DVT on grayscale or color Doppler imaging. Doppler waveforms show normal direction of venous flow, normal respiratory plasticity and response to augmentation. OTHER None. Limitations: none IMPRESSION: Negative. Electronically Signed   By: Constance Holster M.D.   On: 10/04/2019 16:12   DG Foot Complete Right  Result Date: 10/03/2019 CLINICAL DATA:  Ulcer on the medial side of the heel. EXAM: RIGHT FOOT COMPLETE - 3+ VIEW COMPARISON:  April 24, 2018 FINDINGS: Again noted are advanced degenerative changes of the  first metatarsophalangeal joint. There is an old healed fracture of the fourth metatarsal head. Again noted is a deformity of the midfoot and hindfoot as previously seen. There is a plantar ulcer with overlying soft tissue edema. There is no definite radiographic evidence for osteomyelitis. IMPRESSION: 1. Plantar ulcer without definite radiographic evidence for osteomyelitis. 2. Stable mid and hindfoot deformity. Electronically Signed   By: Constance Holster M.D.   On: 10/03/2019 23:51   US Abdomen Limited RUQ  Result Date: 10/04/2019 CLINICAL DATA:  Pain EXAM: ULTRASOUND ABDOMEN LIMITED RIGHT UPPER QUADRANT COMPARISON:  Ultrasound dated April 21, 2013. FINDINGS: Gallbladder: The gallbladder is distended with a small amount of gallbladder sludge. There is no evidence for cholelithiasis. There is no gallbladder wall thickening. The sonographic Percell Miller sign is negative. Common bile duct: Diameter: 4 mm Liver: Diffuse increased echogenicity with slightly heterogeneous liver. Appearance typically secondary to fatty infiltration. Fibrosis secondary consideration. No secondary findings of cirrhosis noted. No focal hepatic lesion or intrahepatic biliary duct dilatation. Portal vein is patent on color Doppler imaging with normal direction of blood flow towards the liver. Other: None. IMPRESSION: 1. Distended gallbladder without evidence for cholelithiasis or acute cholecystitis. 2. Hepatic steatosis. 3. There is a small amount of gallbladder sludge within the gallbladder lumen. Electronically Signed   By: Constance Holster M.D.   On: 10/04/2019 16:13      Assessment & Plan: [name] [name] is a 74 y.o. black female with hypothyroidism, hypertension, hyperlipidemia, diabetes mellitus type II insulin dependent, who was admitted to [facility] on 10/03/2019 for Right hip pain [M25.551] Cellulitis of right leg [Q46.962] Foot ulceration, right, with unspecified severity ([place]) [L97.519] Acute kidney injury superimposed  on CKD ([place]) [N17.9, N18.9]  1. Acute renal failure with hyperkalemia on chronic kidney disease stage IV with proteinuria: baseline creatinine of 2.31, GFR of 20 on 06/21/19.  Chronic kidney disease secondary to diabetic nephropathy Acute kidney injury most likely due to concurrent illness.  Renal ultrasound with no obstruction No IV contrast exposure.  - lokelma - IV fluids: NS at 53mL/hr - holding quinapril.   2. Hypertension: with preserved systolic function. Home regimen of amlodipine, carvedilol, and quinapril.  Restarted amlodipine  3. Diabetes mellitus type II with chronic kidney disease: insulin dependent. Hemoglobin X5M of 8.4% Complicated by diabetic foot ulcer and charcot neuroarthropathy.   4. Anemia of chronic kidney disease:  - Check SPEP/UPEP   LOS: 0 Sarath Kolluru 6/29/20216:49 PM

## 2019-10-05 NOTE — Care Management Obs Status (Signed)
[place] NOTIFICATION   Patient Details  Name: EMMAJEAN RATLEDGE MRN: [id number] Date of Birth: 11-Apr-1945   Medicare Observation Status Notification Given:  Yes    Shelbie Ammons, RN 10/05/2019, 10:50 AM

## 2019-10-05 NOTE — Progress Notes (Signed)
Patient requested to see Chaplain. Chaplain listened as patient discussed her back injury, and concerns for her son's welfare. Chaplain offered space for patient to talk, ministry of presence, prayer for healing, peace, and comfort.

## 2019-10-05 NOTE — TOC Initial Note (Signed)
Transition of Care [facility]) - Initial/Assessment Note    Patient Details  Name: [name] [name] MRN: [id number] Date of Birth: [birth year]  Transition of Care [facility]) CM/SW Contact:    Shelbie Ammons, RN Phone Number: 10/05/2019, 10:46 AM  Clinical Narrative:   RNCM assessed patient at bedside. Patient reports to feeling some better but admits to still being sore. Patient reports that she lives with her adult son and he assists her with her daily care. She also reports that she has and aide that comes in 3 times per week from [employer]. Patient denies any other services in the home. She does have a cane she can use when needed. Patient reports that she sees Dr. Lennox Grumbles at [facility] and gets her medications from there as well.  Patient is agreeable to home health and does not have a preference as to agency. She is also agreeable to any equipment recommended by therapist.  RNCM contacted Corene Cornea with [facility] and he accepted referral.  RNCM contacted Zack with [employer] and he will provide wheel chair.  Obs letter signed at bedside.                 Expected Discharge Plan: [place] Barriers to Discharge: No Barriers Identified   Patient Goals and CMS Choice     Choice offered to / list presented to : Patient  Expected Discharge Plan and Services Expected Discharge Plan: [place]   Discharge Planning Services: CM Consult Post Acute Care Choice: [place] arrangements for the past 2 months: Single Family Home                 DME Arranged: Wheelchair manual DME Agency: [employer] Date DME Agency Contacted: 10/05/19 Time DME Agency Contacted: [id number] Representative spoke with at DME Agency: [place]: PT Waves: [place] ([place]) Date [place]: 10/05/19 Time HH Agency Contacted: 1000 Representative spoke with at [place]: Corene Cornea  Prior Living Arrangements/Services Living arrangements for the past 2 months:  [place] with:: Adult Children Patient language and need for interpreter reviewed:: Yes Do you feel safe going back to the place where you live?: Yes      Need for Family Participation in Patient Care: Yes (Comment) Care giver support system in place?: Yes (comment) Current home services: Homehealth aide Criminal Activity/Legal Involvement Pertinent to Current Situation/Hospitalization: No - Comment as needed  Activities of Daily Living Home Assistive Devices/Equipment: Environmental consultant (specify type), Cane (specify quad or straight) ADL Screening (condition at time of admission) Patient's cognitive ability adequate to safely complete daily activities?: Yes Is the patient deaf or have difficulty hearing?: No Does the patient have difficulty seeing, even when wearing glasses/contacts?: No Does the patient have difficulty concentrating, remembering, or making decisions?: No Patient able to express need for assistance with ADLs?: No Does the patient have difficulty dressing or bathing?: No Independently performs ADLs?: Yes (appropriate for developmental age) Walks in Home: Independent with device (comment) Does the patient have difficulty walking or climbing stairs?: Yes Weakness of Legs: Both Weakness of Arms/Hands: None  Permission Sought/Granted                  Emotional Assessment Appearance:: Appears stated age Attitude/Demeanor/Rapport: Engaged Affect (typically observed): Appropriate Orientation: : Oriented to Self, Oriented to Place, Oriented to  Time, Oriented to Situation Alcohol / Substance Use: Not Applicable Psych Involvement: No (comment)  Admission diagnosis:  Right hip pain [  M25.551] Cellulitis of right leg [L03.115] Foot ulceration, right, with unspecified severity ([place]) [L97.519] Patient Active Problem List   Diagnosis Date Noted  . Leukocytosis 10/04/2019  . Fall at home, initial encounter 10/04/2019  . Cellulitis of right leg 10/04/2019  . CKD  stage 4 due to type 2 diabetes mellitus ([place]) 10/04/2019  . Acute kidney injury superimposed on CKD ([place])   . RUQ pain   . Hyperlipidemia   . Right leg pain 10/03/2019  . Diabetic ulcer of right foot associated with type 2 diabetes mellitus, with fat layer exposed ([place]) 10/03/2019  . Cellulitis of right foot 10/03/2019  . HTN (hypertension) 10/03/2019  . Type 2 diabetes mellitus with other specified complication ([place]) [id number]  . Hypothyroidism (acquired) 10/03/2019  . UTI (urinary tract infection) 10/03/2019  . Sepsis ([place]) 04/25/2018   PCP:  Marguerita Merles, MD Pharmacy:   [place], [place] - [place] [address] [place] [address] Phone: [phone] Fax: [phone]     Social Determinants of Health (SDOH) Interventions    Readmission Risk Interventions No flowsheet data found.

## 2019-10-06 LAB — BASIC METABOLIC PANEL
Anion gap: 10 (ref 5–15)
BUN: 45 mg/dL — ABNORMAL HIGH (ref 8–23)
CO2: 18 mmol/L — ABNORMAL LOW (ref 22–32)
Calcium: 8 mg/dL — ABNORMAL LOW (ref 8.9–10.3)
Chloride: 109 mmol/L (ref 98–111)
Creatinine, Ser: 2.54 mg/dL — ABNORMAL HIGH (ref 0.44–1.00)
GFR calc Af Amer: 21 mL/min — ABNORMAL LOW (ref >60)
GFR calc non Af Amer: 18 mL/min — ABNORMAL LOW (ref >60)
Glucose, Bld: 160 mg/dL — ABNORMAL HIGH (ref 70–99)
Potassium: 4.2 mmol/L (ref 3.5–5.1)
Sodium: 137 mmol/L (ref 135–145)

## 2019-10-06 LAB — GLUCOSE, CAPILLARY
Glucose-Capillary: 155 mg/dL — ABNORMAL HIGH (ref 70–99)
Glucose-Capillary: 149 mg/dL — ABNORMAL HIGH (ref 70–99)
Glucose-Capillary: 184 mg/dL — ABNORMAL HIGH (ref 70–99)

## 2019-10-06 LAB — PROTEIN ELECTROPHORESIS, SERUM
A/G Ratio: 0.8 (ref 0.7–1.7)
Albumin ELP: 3.1 g/dL (ref 2.9–4.4)
Alpha-1-Globulin: 0.3 g/dL (ref 0.0–0.4)
Alpha-2-Globulin: 1 g/dL (ref 0.4–1.0)
Beta Globulin: 1 g/dL (ref 0.7–1.3)
Gamma Globulin: 1.6 g/dL (ref 0.4–1.8)
Globulin, Total: 3.9 g/dL (ref 2.2–3.9)
Total Protein ELP: 7 g/dL (ref 6.0–8.5)

## 2019-10-06 LAB — PROCALCITONIN: Procalcitonin: 1.87 ng/mL

## 2019-10-06 LAB — PROTEIN ELECTRO, RANDOM URINE
Albumin ELP, Urine: 38.7 %
Alpha-1-Globulin, U: 6.7 %
Alpha-2-Globulin, U: 15.9 %
Beta Globulin, U: 27.3 %
Gamma Globulin, U: 11.4 %
Total Protein, Urine: 63 mg/dL

## 2019-10-06 LAB — KAPPA/LAMBDA LIGHT CHAINS
Kappa free light chain: 83.7 mg/L — ABNORMAL HIGH (ref 3.3–19.4)
Kappa, lambda light chain ratio: 1.54 (ref 0.26–1.65)
Lambda free light chains: 54.5 mg/L — ABNORMAL HIGH (ref 5.7–26.3)

## 2019-10-06 MED ORDER — CARVEDILOL 25 MG PO TABS
25.0000 mg | ORAL_TABLET | Freq: Two times a day (BID) | ORAL | Status: DC
  Administered 2019-10-06 – 2019-10-07 (×3): 25 mg via ORAL
  Filled 2019-10-06 (×4): qty 1

## 2019-10-06 NOTE — Hospital Course (Signed)
[name] [name] is a 74 y.o. 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  States that she did not fall but rather slid off the bed landing onto her right hip and now has right hip pain, 5 out of 10.  She denied any recent illnesses, fever or chills, or recent infections.  In the ED, vitals were normal.  Labs notable for WBC 21k, Cr 2.26 (close to baseline CKD), lactic acid 1.1.  UA negative for infection.  Chest xray negative.  CT pelvis negative for fracture, showed possible cystitis.  She was noted to have a R foot diabetic ulcer, xray of the foot showed plantar ulcer without definite evidence of osteomyelitis.  Patient started on Vancomycin and Rocephin for suspected diabetic ulcer infection.  Admitted to hospitalist service.

## 2019-10-06 NOTE — Progress Notes (Signed)
PROGRESS NOTE    [name] [name]   NLG:[id number]  DOB: [birth year]  PCP: Marguerita Merles, MD    DOA: 10/03/2019 LOS: 1   Brief Narrative   [name] [name] is a 74 y.o. 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  States that she did not fall but rather slid off the bed landing onto her right hip and now has right hip pain, 5 out of 10.  She denied any recent illnesses, fever or chills, or recent infections.  In the ED, vitals were normal.  Labs notable for WBC 21k, Cr 2.26 (close to baseline CKD), lactic acid 1.1.  UA negative for infection.  Chest xray negative.  CT pelvis negative for fracture, showed possible cystitis.  She was noted to have a R foot diabetic ulcer, xray of the foot showed plantar ulcer without definite evidence of osteomyelitis.  Patient started on Vancomycin and Rocephin for suspected diabetic ulcer infection.  Admitted to hospitalist service.     Assessment & Plan   Active Problems:   Right hip pain   Diabetic ulcer of right foot associated with type 2 diabetes mellitus, with fat layer exposed ([place])   Cellulitis of right foot   HTN (hypertension)   Type 2 diabetes mellitus with other specified complication (HCC)   Hypothyroidism (acquired)   UTI (urinary tract infection)   Leukocytosis   Fall at home, initial encounter   Cellulitis of right leg   CKD stage 4 due to type 2 diabetes mellitus ([place])   Acute kidney injury superimposed on CKD ([place])   RUQ pain   Hyperlipidemia   Hyperkalemia   Rib pain on left side   Diabetic foot ulcer with leukocytosis - WBC 21k concerning for infection, and no other source.  X-ray was negative for signs of osteomyelitis.  ESR slightly elevated at 6.5.  Procalcitonin elevated at 2.94, trending down.  Leukocytosis  improving.  Initially treated with IV vancomycin and Rocephin.  Continue Augmentin and doxycycline.  AKI superimposed on CKD stage IV -AKI present on admission likely due to prerenal azotemia.  Improved with hydration.  Hold lisinopril.  Nephrology following.  Baseline creatinine around 2.3, GFR 20 (as of March 2021).  Hyperkalemia - POA, secondary to above, resolved.  Treated with Lokelma.  Monitor BMP.  Hypertension -continue home amlodipine, Coreg.  Hold ACE inhibitor.  Type 2 diabetes -continue Lantus and sliding scale NovoLog.  Right hip and leg pain -present on admission after slipping out of bed at home.  CT scan of the pelvis on admission was negative for fracture and ultrasound negative for DVT.  PT evaluation.  Right upper quadrant pain - RUQ ultrasound was negative for signs of acute cholecystitis, but did show hepatic steatosis and small amount of gallbladder sludge.  Continue to monitor.  Left rib pain -x-ray negative for fractures.  SPEP was ordered and pending.  Hyperlipidemia -continue Lipitor  Hypothyroidism -continue levothyroxine  Mild memory loss -continue Aricept  Patient BMI: Body mass index is 33.45 kg/m.   DVT prophylaxis: enoxaparin (LOVENOX) injection 30 mg Start: 10/04/19 1000 SCDs Start: 10/04/19 0004   Diet:  Diet Orders (From admission, onward)    Start     Ordered   10/04/19 1707  Diet heart healthy/carb modified Room service appropriate? Yes; Fluid consistency: Thin  Diet  effective now       Question Answer Comment  Diet-HS Snack? Nothing   Room service appropriate? Yes   Fluid consistency: Thin      10/04/19 1706            Code Status: Full Code    Subjective 10/06/19    Patient seen at bedside with son present this morning.  She reports ongoing poor appetite which is chronic but denies abdominal pain nausea or vomiting.  Also denies any fevers or chills.  She states her foot ulcer has been there for at least a month.  No acute events  reported.  Son expresses concern of patient's leg weakness and safety with ambulation.  Disposition Plan & Communication   Status is: Inpatient  Remains inpatient appropriate because:Inpatient level of care appropriate due to severity of illness. And PT evaluation pending   Dispo: The patient is from: Home              Anticipated d/c is to: Home              Anticipated d/c date is: 1 day              Patient currently is not medically stable to d/c.    Family Communication: Son at bedside during encounter   Consults, Procedures, Significant Events   Consultants:   Nephrology  Procedures:   none  Antimicrobials:   Augmentin & Doxycycline    Objective   Vitals:   10/05/19 0743 10/06/19 0009 10/06/19 0732 10/06/19 1554  BP: (!) 169/79 (!) 147/99 (!) 144/72 (!) 162/79  Pulse: 78 64 73 (!) 51  Resp: _0 Temp: 98 F (36.7 C) 98.6 F (37 C) 99.3 F (37.4 C) 99.5 F (37.5 C)  TempSrc: Oral Oral Oral Oral  SpO2: 99% 94% 95% 95%  Weight:      Height:        Intake/Output Summary (Last 24 hours) at 10/06/2019 1715 Last data filed at 10/06/2019 0012 Gross per 24 hour  Intake --  Output 500 ml  Net -500 ml   Filed Weights   10/03/19 1646 10/04/19 0329  Weight: 93.9 kg 96.9 kg    Physical Exam:  General exam: awake, alert, no acute distress Respiratory system: CTAB, no wheezes, rales or rhonchi, normal respiratory effort. Cardiovascular system: normal S1/S2, RRR, no JVD, murmurs, rubs, gallops, no pedal edema.   Gastrointestinal system: soft, NT, ND, no HSM felt, +bowel sounds. Central nervous system: A&O x3. no gross focal neurologic deficits, normal speech Extremities: moves all, right foot with clean dry and intact dressings (ulcer not visualized) Skin: dry, intact, normal temperature, normal color Psychiatry: normal mood, congruent affect, judgement and insight appear normal  Labs   Data Reviewed: I have personally reviewed following labs and  imaging studies  CBC: Recent Labs  Lab 10/03/19 1649 10/04/19 0503 10/05/19 0313  WBC 21.9* 19.0* 14.9*  NEUTROABS 18.5*  --   --   HGB 11.9* 10.7* 10.6*  HCT 34.5* 32.7* 31.0*  MCV 88.9 92.6 89.1  PLT 204 187 144   Basic Metabolic Panel: Recent Labs  Lab 10/03/19 1720 10/04/19 0503 10/05/19 0313 10/06/19 0443  NA 140 137 138 137  K 4.7 5.5* 4.8 4.2  CL 111 108 108 109  CO2 17* 18* 20* 18*  GLUCOSE 157* 204* 147* 160*  BUN 35* 41* 50* 45*  CREATININE 2.26* 2.53* 2.87* 2.54*  CALCIUM 9.1 8.7* 8.3* 8.0*   GFR:  Estimated Creatinine Clearance: 23.6 mL/min (A) (by C-G formula based on SCr of 2.54 mg/dL (H)). Liver Function Tests: Recent Labs  Lab 10/03/19 1720  AST 24  ALT 16  ALKPHOS 96  BILITOT 0.9  PROT 8.0  ALBUMIN 3.7   No results for input(s): LIPASE, AMYLASE in the last 168 hours. No results for input(s): AMMONIA in the last 168 hours. Coagulation Profile: No results for input(s): INR, PROTIME in the last 168 hours. Cardiac Enzymes: Recent Labs  Lab 10/04/19 0503  CKTOTAL 208   BNP (last 3 results) No results for input(s): PROBNP in the last 8760 hours. HbA1C: Recent Labs    10/04/19 0013  HGBA1C 7.3*   CBG: Recent Labs  Lab 10/05/19 1549 10/05/19 2054 10/06/19 0734 10/06/19 1142 10/06/19 1631  GLUCAP 183* 149* 155* 149* 184*   Lipid Profile: No results for input(s): CHOL, HDL, LDLCALC, TRIG, CHOLHDL, LDLDIRECT in the last 72 hours. Thyroid Function Tests: No results for input(s): TSH, T4TOTAL, FREET4, T3FREE, THYROIDAB in the last 72 hours. Anemia Panel: No results for input(s): VITAMINB12, FOLATE, FERRITIN, TIBC, IRON, RETICCTPCT in the last 72 hours. Sepsis Labs: Recent Labs  Lab 10/03/19 2013 10/04/19 0503 10/05/19 0313 10/06/19 0443  PROCALCITON  --  2.92 2.94 1.87  LATICACIDVEN 1.1  --   --   --     Recent Results (from the past 240 hour(s))  Blood culture (routine x 2)     Status: None (Preliminary result)    Collection Time: 10/03/19  8:13 PM   Specimen: BLOOD  Result Value Ref Range Status   Specimen Description BLOOD LEFT HAND  Final   Special Requests   Final    BOTTLES DRAWN AEROBIC AND ANAEROBIC Blood Culture results may not be optimal due to an inadequate volume of blood received in culture bottles   Culture   Final    NO GROWTH 3 DAYS Performed at [facility], [address], [place], [place] [address]    Report Status PENDING  Incomplete  Blood culture (routine x 2)     Status: None (Preliminary result)   Collection Time: 10/03/19  8:13 PM   Specimen: BLOOD  Result Value Ref Range Status   Specimen Description BLOOD LEFT [facility]  Final   Special Requests   Final    BOTTLES DRAWN AEROBIC AND ANAEROBIC Blood Culture adequate volume   Culture   Final    NO GROWTH 3 DAYS Performed at [facility], [address], [place], [place] [address]    Report Status PENDING  Incomplete  Urine culture     Status: None   Collection Time: 10/03/19 10:18 PM   Specimen: Urine, Random  Result Value Ref Range Status   Specimen Description   Final    URINE, RANDOM Performed at [facility], [address] [place], [place] [address]    Special Requests   Final    NONE Performed at [facility], [address], [place], [place] [address]    Culture   Final    NO GROWTH Performed at [facility], [place] [address], [place], [place] [address]    Report Status 10/05/2019 FINAL  Final  Resp Panel by RT PCR (RSV, Flu A&B, Covid) - Nasopharyngeal Swab     Status: None   Collection Time: 10/04/19 12:47 AM   Specimen: Nasopharyngeal Swab  Result Value Ref Range Status   SARS Coronavirus 2 by RT PCR NEGATIVE NEGATIVE Final    Comment: (  NOTE) SARS-CoV-2 target nucleic acids are NOT DETECTED.  The SARS-CoV-2 RNA is generally detectable in upper respiratoy specimens during the acute phase of infection. The lowest concentration of SARS-CoV-2 viral copies this  assay can detect is 131 copies/mL. A negative result does not preclude SARS-Cov-2 infection and should not be used as the sole basis for treatment or other patient management decisions. A negative result may occur with  improper specimen collection/handling, submission of specimen other than nasopharyngeal swab, presence of viral mutation(s) within the areas targeted by this assay, and inadequate number of viral copies (<131 copies/mL). A negative result must be combined with clinical observations, patient history, and epidemiological information. The expected result is Negative.  Fact Sheet for Patients:  PinkCheek.be  Fact Sheet for Healthcare Providers:  GravelBags.it  This test is no t yet approved or cleared by the [place] FDA and  has been authorized for detection and/or diagnosis of SARS-CoV-2 by FDA under an Emergency Use Authorization (EUA). This EUA will remain  in effect (meaning this test can be used) for the duration of the COVID-19 declaration under Section 564(b)(1) of the Act, 21 U.S.C. section 360bbb-3(b)(1), unless the authorization is terminated or revoked sooner.     Influenza A by PCR NEGATIVE NEGATIVE Final   Influenza B by PCR NEGATIVE NEGATIVE Final    Comment: (NOTE) The Xpert Xpress SARS-CoV-2/FLU/RSV assay is intended as an aid in  the diagnosis of influenza from Nasopharyngeal swab specimens and  should not be used as a sole basis for treatment. Nasal washings and  aspirates are unacceptable for Xpert Xpress SARS-CoV-2/FLU/RSV  testing.  Fact Sheet for Patients: PinkCheek.be  Fact Sheet for Healthcare Providers: GravelBags.it  This test is not yet approved or cleared by the [place] FDA and  has been authorized for detection and/or diagnosis of SARS-CoV-2 by  FDA under an Emergency Use Authorization (EUA). This EUA will  remain  in effect (meaning this test can be used) for the duration of the  Covid-19 declaration under Section 564(b)(1) of the Act, 21  U.S.C. section 360bbb-3(b)(1), unless the authorization is  terminated or revoked.    Respiratory Syncytial Virus by PCR NEGATIVE NEGATIVE Final    Comment: (NOTE) Fact Sheet for Patients: PinkCheek.be  Fact Sheet for Healthcare Providers: GravelBags.it  This test is not yet approved or cleared by the [place] FDA and  has been authorized for detection and/or diagnosis of SARS-CoV-2 by  FDA under an Emergency Use Authorization (EUA). This EUA will remain  in effect (meaning this test can be used) for the duration of the  COVID-19 declaration under Section 564(b)(1) of the Act, 21 U.S.C.  section 360bbb-3(b)(1), unless the authorization is terminated or  revoked. Performed at [facility], [address] [place], [place] [address]       Imaging Studies   DG Ribs Unilateral Left  Result Date: 10/05/2019 CLINICAL DATA:  Left rib pain.  No injury. EXAM: LEFT RIBS - 2 VIEW COMPARISON:  10/03/2019. FINDINGS: No acute or focal bony abnormality identified. No evidence of displaced rib fracture or pneumothorax. Degenerative change thoracic spine. Cardiomegaly again noted. Bibasilar atelectasis. IMPRESSION: 1. No evidence of displaced rib fracture or focal bony abnormality identified. No pneumothorax. 2.  Cardiomegaly again noted.  Bibasilar atelectasis. Electronically Signed   By: Marcello Moores  Register   On: 10/05/2019 13:52   US RENAL  Result Date: 10/05/2019 CLINICAL DATA:  Chronic kidney disease stage 4 EXAM: RENAL / URINARY TRACT ULTRASOUND COMPLETE COMPARISON:  None. FINDINGS: Right Kidney: Renal measurements: 9.5 x 6.0 x 5.1 cm = volume: 153 mL. Cortical thinning. Increased echotexture. No mass or hydronephrosis. Small calcifications, likely nonobstructing stones. Left Kidney: Renal  measurements: 9.5 x 4.6 x 5.1 cm = volume: 116 mL. Cortical thinning with increased echotexture. No mass or hydronephrosis. Small calcifications, likely nonobstructing stones. Bladder: Appears normal for degree of bladder distention. Other: None. IMPRESSION: No acute findings.  No hydronephrosis. Cortical thinning and increased echotexture. Bilateral nonobstructing nephrolithiasis. Electronically Signed   By: Rolm Baptise M.D.   On: 10/05/2019 15:25     Medications   Scheduled Meds: . amLODipine  10 mg Oral Daily  . amoxicillin-clavulanate  1 tablet Oral Q12H  . aspirin  81 mg Oral Daily  . atorvastatin  40 mg Oral QPM  . carvedilol  25 mg Oral BID  . donepezil  10 mg Oral QHS  . doxycycline  100 mg Oral Q12H  . enoxaparin (LOVENOX) injection  30 mg Subcutaneous Q24H  . insulin aspart  0-15 Units Subcutaneous TID WC  . insulin aspart  0-5 Units Subcutaneous QHS  . insulin glargine  8 Units Subcutaneous QHS  . levothyroxine  50 mcg Oral Q0600   Continuous Infusions:     LOS: 1 day    Time spent: 30 minutes    Ezekiel Slocumb, DO [facility]  10/06/2019, 5:15 PM    If 7PM-7AM, please contact night-coverage. How to contact the [facility] Attending or Consulting provider [place] or covering provider during after hours [place], for this patient?    1. Check the care team in [facility] and look for a) attending/consulting TR[facility] provider listed and b) the [facility] team listed 2. Log into [email or web address] and use [place]'s universal password to access. If you do not have the password, please contact the hospital operator. 3. Locate the [facility] provider you are looking for under [facility] and page to a number that you can be directly reached. 4. If you still have difficulty reaching the provider, please page the [facility] (Director on Call) for the Hospitalists listed on amion for assistance.

## 2019-10-06 NOTE — Progress Notes (Signed)
Writer provided dressing change to right foot.

## 2019-10-06 NOTE — Progress Notes (Signed)
   10/06/19 1500  Clinical Encounter Type  Visited With Patient and family together  Visit Type Follow-up;Spiritual support  Referral From Chaplain  Consult/Referral To Chaplain  Chaplain stopped in to see patient at the recommendation of Chaplain Pete. Chaplain briefly visited with patient and son. Patient said that she is feeling better and chaplain told that is a good thing to hear. Patient is in some pain, but still said she is better. Chaplain asked if she could pray with them and patient said yes. After chaplain prayed she wished patient well and told her she will check back in on her. Patient said she might leave tomorrow, therefore chaplain told her that she will stop by later tonight.

## 2019-10-06 NOTE — Plan of Care (Signed)
  Problem: Education: Goal: Knowledge of General Education information will improve Description: Including pain rating scale, medication(s)/side effects and non-pharmacologic comfort measures 10/06/2019 0645 by Rollen Sox, RN Outcome: Progressing 10/06/2019 0644 by Rollen Sox, RN Outcome: Progressing   Problem: Clinical Measurements: Goal: Ability to maintain clinical measurements within normal limits will improve 10/06/2019 0645 by Rollen Sox, RN Outcome: Progressing 10/06/2019 0644 by Rollen Sox, RN Outcome: Progressing Goal: Will remain free from infection 10/06/2019 0645 by Rollen Sox, RN Outcome: Progressing 10/06/2019 0644 by Rollen Sox, RN Outcome: Progressing Goal: Diagnostic test results will improve 10/06/2019 0645 by Rollen Sox, RN Outcome: Progressing 10/06/2019 0644 by Rollen Sox, RN Outcome: Progressing Goal: Respiratory complications will improve Outcome: Progressing Goal: Cardiovascular complication will be avoided Outcome: Progressing   Problem: Activity: Goal: Risk for activity intolerance will decrease 10/06/2019 0645 by Rollen Sox, RN Outcome: Progressing 10/06/2019 0644 by Rollen Sox, RN Outcome: Progressing   Problem: Pain Managment: Goal: General experience of comfort will improve Outcome: Progressing   Problem: Safety: Goal: Ability to remain free from injury will improve 10/06/2019 0645 by Rollen Sox, RN Outcome: Progressing 10/06/2019 0644 by Rollen Sox, RN Outcome: Progressing

## 2019-10-06 NOTE — Progress Notes (Signed)
Physical Therapy Treatment Patient Details Name: [name] [name] MRN: [id number] DOB: Jan 12, 1946 Today's Date: 10/06/2019    History of Present Illness [name] [name] is a 74 y.o. 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  She denies any complaints other than right hip pain.    PT Comments    Pt in recliner upon arrival, participates in chair level exercises, significantly weaker than 2 days prior needing author's assist for full available ROM in basic exercises in the open chain. Pt has bilat knee pain, the Rt of which limited TKE, and the left of which prohibits use of knee block. Pt and son educated on use of WC, leg rests, brakes, cushion, arm rests, and positioning for SPT c and s RW. Pt requires maxA+2 for all transfers this date, much more weak and pain limited, pt also limited by gross fatigue. Pt able to AMB ~7-15ft with RW, slow and segmented 3-point gait, but it does not appear very safe. Son educated on safe technique for stand pivot transfers at end of session. Pt decline sitting up any more as she is fatigued and somnolent.     Follow Up Recommendations  Home health PT;Supervision - Intermittent (Son prefers to provide assist at home.)     Equipment Recommendations  Wheelchair (measurements PT);Wheelchair cushion (measurements PT);Other (comment) (already received)    Recommendations for Other Services       Precautions / Restrictions Precautions Precautions: Fall Other Brace: Darco shoe on Right, orthopedic shoe on Left Restrictions Weight Bearing Restrictions: No Other Position/Activity Restrictions: "Minimize AMB to promote healing" per podiatry    Mobility  Bed Mobility Overal bed mobility: Needs Assistance Bed Mobility: Sit to Supine       Sit to supine: Max assist;+2 for physical  assistance   General bed mobility comments: unable to pick legs up into bed  Transfers Overall transfer level: Needs assistance Equipment used: Rolling walker (2 wheeled);None Transfers: Sit to/from [facility] (much more weak today and limited energy available) Sit to Stand: Max assist;+2 safety/equipment Stand pivot transfers: Max assist;+2 safety/equipment       General transfer comment: Multiple attempts due to difficulty with forward excursion, but follows cues for foward lean, does better with elevated EOB.  Ambulation/Gait Ambulation/Gait assistance: Min assist Gait Distance (Feet): 7 Feet Assistive device: Rolling walker (2 wheeled) Gait Pattern/deviations: Step-to pattern     General Gait Details: weak, bent knees maintained with questionable control, chair follow needed, pt appears unsafe and is limited.   Stairs             Information systems manager mobility: Yes (educated pt on WC use and self propulsion, but pt unable to propel self due to BUE weakness)  Modified Rankin (Stroke Patients Only)       Balance Overall balance assessment: Needs assistance Sitting-balance support: Bilateral upper extremity supported;Feet supported Sitting balance-Leahy Scale: Poor     Standing balance support: During functional activity;Bilateral upper extremity supported Standing balance-Leahy Scale: Poor Standing balance comment: unable to straighten knees                            Cognition Arousal/Alertness: Awake/alert Behavior During Therapy: WFL for tasks assessed/performed Overall Cognitive Status: Within Functional Limits for tasks assessed  Exercises General Exercises - Lower Extremity Long Arc Quad: AROM;AAROM;Both;15 reps;Seated;Limitations Long [employer] Limitations: very weak, unable to achieve TKE bilat Rt worse than left Hip Flexion/Marching:  AAROM;Both;10 reps;Seated (very limited in range and painful, but improves with reps)    General Comments        Pertinent Vitals/Pain Pain Assessment: Faces Faces Pain Scale: Hurts even more Pain Location: bilat knees Pain Intervention(s): Limited activity within patient's tolerance;Monitored during session;Repositioned    Home Living                      Prior Function            PT Goals (current goals can now be found in the care plan section) Acute Rehab PT Goals Patient Stated Goal: regain strength, reduce pain, return to home PT Goal Formulation: With patient Time For Goal Achievement: 10/18/19 Potential to Achieve Goals: Fair Progress towards PT goals: Not progressing toward goals - comment    Frequency    Min 2X/week      PT Plan Current plan remains appropriate    Co-evaluation              AM-PAC PT "6 Clicks" Mobility   Outcome Measure  Help needed turning from your back to your side while in a flat bed without using bedrails?: Total Help needed moving from lying on your back to sitting on the side of a flat bed without using bedrails?: Total Help needed moving to and from a bed to a chair (including a wheelchair)?: Total Help needed standing up from a chair using your arms (e.g., wheelchair or bedside chair)?: Total Help needed to walk in hospital room?: Total Help needed climbing 3-5 steps with a railing? : Total 6 Click Score: 6    End of Session Equipment Utilized During Treatment: Gait belt Activity Tolerance: Patient limited by pain;Patient limited by fatigue;Patient limited by lethargy;No increased pain Patient left: with family/visitor present;with call bell/phone within reach;in bed;with bed alarm set Nurse Communication: Mobility status PT Visit Diagnosis: Unsteadiness on feet (R26.81);Other abnormalities of gait and mobility (R26.89);Difficulty in walking, not elsewhere classified (R26.2)     Time: 1100-1205 PT Time  Calculation (min) (ACUTE ONLY): 65 min  Charges:  $Gait Training: 8-22 mins $Therapeutic Exercise: 8-22 mins $Therapeutic Activity: 8-22 mins $Self Care/Home Management: 8-22                     12:45 PM, 10/06/19 Etta Grandchild, PT, DPT Physical Therapist - [facility]  [phone] ([place])    [place] 10/06/2019, 12:42 PM

## 2019-10-06 NOTE — Progress Notes (Signed)
[facility]  ROUNDING NOTE   Subjective:   Working with physical therapy.   Objective:  Vital signs in last 24 hours:  Temp:  [98.6 F (37 C)-99.3 F (37.4 C)] 99.3 F (37.4 C) (06/30 0732) Pulse Rate:  [64-73] 73 (06/30 0732) Resp:  [17-18] 17 (06/30 0732) BP: (144-147)/(72-99) 144/72 (06/30 0732) SpO2:  [94 %-95 %] 95 % (06/30 0732)  Weight change:  Filed Weights   10/03/19 1646 10/04/19 0329  Weight: 93.9 kg 96.9 kg    Intake/Output: I/O last 3 completed shifts: In: 830.6 [P.O.:600; I.V.:165.8; IV Piggyback:64.8] Out: 900 [Urine:900]   Intake/Output this shift:  No intake/output data recorded.  Physical Exam: General: NAD,   Head: Normocephalic, atraumatic. Moist oral mucosal membranes  Eyes: Anicteric, PERRL  Neck: Supple, trachea midline  Lungs:  Clear to auscultation  Heart: Regular rate and rhythm  Abdomen:  Soft, nontender,   Extremities:  no peripheral edema.  Neurologic: Nonfocal, moving all four extremities  Skin: No lesions        Basic Metabolic Panel: Recent Labs  Lab 10/03/19 1720 10/03/19 1720 10/04/19 0503 10/05/19 0313 10/06/19 0443  NA 140  --  137 138 137  K 4.7  --  5.5* 4.8 4.2  CL 111  --  108 108 109  CO2 17*  --  18* 20* 18*  GLUCOSE 157*  --  204* 147* 160*  BUN 35*  --  41* 50* 45*  CREATININE 2.26*  --  2.53* 2.87* 2.54*  CALCIUM 9.1   < > 8.7* 8.3* 8.0*   < > = values in this interval not displayed.    Liver Function Tests: Recent Labs  Lab 10/03/19 1720  AST 24  ALT 16  ALKPHOS 96  BILITOT 0.9  PROT 8.0  ALBUMIN 3.7   No results for input(s): LIPASE, AMYLASE in the last 168 hours. No results for input(s): AMMONIA in the last 168 hours.  CBC: Recent Labs  Lab 10/03/19 1649 10/04/19 0503 10/05/19 0313  WBC 21.9* 19.0* 14.9*  NEUTROABS 18.5*  --   --   HGB 11.9* 10.7* 10.6*  HCT 34.5* 32.7* 31.0*  MCV 88.9 92.6 89.1  PLT 204 187 179    Cardiac Enzymes: Recent Labs  Lab  10/04/19 0503  CKTOTAL 208    BNP: Invalid input(s): POCBNP  CBG: Recent Labs  Lab 10/05/19 1138 10/05/19 1549 10/05/19 2054 10/06/19 0734 10/06/19 1142  GLUCAP 205* 183* 149* 155* 149*    Microbiology: Results for orders placed or performed during the hospital encounter of 10/03/19  Blood culture (routine x 2)     Status: None (Preliminary result)   Collection Time: 10/03/19  8:13 PM   Specimen: BLOOD  Result Value Ref Range Status   Specimen Description BLOOD LEFT HAND  Final   Special Requests   Final    BOTTLES DRAWN AEROBIC AND ANAEROBIC Blood Culture results may not be optimal due to an inadequate volume of blood received in culture bottles   Culture   Final    NO GROWTH 3 DAYS Performed at [facility], [address] [place], [place] [address]    Report Status PENDING  Incomplete  Blood culture (routine x 2)     Status: None (Preliminary result)   Collection Time: 10/03/19  8:13 PM   Specimen: BLOOD  Result Value Ref Range Status   Specimen Description BLOOD LEFT [facility]  Final   Special Requests   Final    BOTTLES DRAWN  AEROBIC AND ANAEROBIC Blood Culture adequate volume   Culture   Final    NO GROWTH 3 DAYS Performed at [facility], [address] [place], [place] [address]    Report Status PENDING  Incomplete  Urine culture     Status: None   Collection Time: 10/03/19 10:18 PM   Specimen: Urine, Random  Result Value Ref Range Status   Specimen Description   Final    URINE, RANDOM Performed at [facility], [address] [place], [place] [address]    Special Requests   Final    NONE Performed at [facility], [address], [place], [place] [address]    Culture   Final    NO GROWTH Performed at [facility], [place] [address], [place], [place] [address]    Report Status 10/05/2019 FINAL  Final  Resp Panel by RT PCR (RSV, Flu A&B, Covid) - Nasopharyngeal Swab     Status: None   Collection Time:  10/04/19 12:47 AM   Specimen: Nasopharyngeal Swab  Result Value Ref Range Status   SARS Coronavirus 2 by RT PCR NEGATIVE NEGATIVE Final    Comment: (NOTE) SARS-CoV-2 target nucleic acids are NOT DETECTED.  The SARS-CoV-2 RNA is generally detectable in upper respiratoy specimens during the acute phase of infection. The lowest concentration of SARS-CoV-2 viral copies this assay can detect is 131 copies/mL. A negative result does not preclude SARS-Cov-2 infection and should not be used as the sole basis for treatment or other patient management decisions. A negative result may occur with  improper specimen collection/handling, submission of specimen other than nasopharyngeal swab, presence of viral mutation(s) within the areas targeted by this assay, and inadequate number of viral copies (<131 copies/mL). A negative result must be combined with clinical observations, patient history, and epidemiological information. The expected result is Negative.  Fact Sheet for Patients:  PinkCheek.be  Fact Sheet for Healthcare Providers:  GravelBags.it  This test is no t yet approved or cleared by the [place] FDA and  has been authorized for detection and/or diagnosis of SARS-CoV-2 by FDA under an Emergency Use Authorization (EUA). This EUA will remain  in effect (meaning this test can be used) for the duration of the COVID-19 declaration under Section 564(b)(1) of the Act, 21 U.S.C. section 360bbb-3(b)(1), unless the authorization is terminated or revoked sooner.     Influenza A by PCR NEGATIVE NEGATIVE Final   Influenza B by PCR NEGATIVE NEGATIVE Final    Comment: (NOTE) The Xpert Xpress SARS-CoV-2/FLU/RSV assay is intended as an aid in  the diagnosis of influenza from Nasopharyngeal swab specimens and  should not be used as a sole basis for treatment. Nasal washings and  aspirates are unacceptable for Xpert Xpress  SARS-CoV-2/FLU/RSV  testing.  Fact Sheet for Patients: PinkCheek.be  Fact Sheet for Healthcare Providers: GravelBags.it  This test is not yet approved or cleared by the [place] FDA and  has been authorized for detection and/or diagnosis of SARS-CoV-2 by  FDA under an Emergency Use Authorization (EUA). This EUA will remain  in effect (meaning this test can be used) for the duration of the  Covid-19 declaration under Section 564(b)(1) of the Act, 21  U.S.C. section 360bbb-3(b)(1), unless the authorization is  terminated or revoked.    Respiratory Syncytial Virus by PCR NEGATIVE NEGATIVE Final    Comment: (NOTE) Fact Sheet for Patients: PinkCheek.be  Fact Sheet for Healthcare Providers: GravelBags.it  This test is not yet approved  or cleared by the [place] and  has been authorized for detection and/or diagnosis of SARS-CoV-2 by  FDA under an Emergency Use Authorization (EUA). This EUA will remain  in effect (meaning this test can be used) for the duration of the  COVID-19 declaration under Section 564(b)(1) of the Act, 21 U.S.C.  section 360bbb-3(b)(1), unless the authorization is terminated or  revoked. Performed at [facility], [address] [place], [place] [address]     Coagulation Studies: No results for input(s): LABPROT, INR in the last 72 hours.  Urinalysis: Recent Labs    10/03/19 2218  COLORURINE YELLOW*  LABSPEC 1.014  PHURINE 5.0  GLUCOSEU NEGATIVE  HGBUR NEGATIVE  BILIRUBINUR NEGATIVE  KETONESUR NEGATIVE  PROTEINUR 100*  NITRITE NEGATIVE  LEUKOCYTESUR NEGATIVE      Imaging: DG Ribs Unilateral Left  Result Date: 10/05/2019 CLINICAL DATA:  Left rib pain.  No injury. EXAM: LEFT RIBS - 2 VIEW COMPARISON:  10/03/2019. FINDINGS: No acute or focal bony abnormality identified. No evidence of displaced rib fracture  or pneumothorax. Degenerative change thoracic spine. Cardiomegaly again noted. Bibasilar atelectasis. IMPRESSION: 1. No evidence of displaced rib fracture or focal bony abnormality identified. No pneumothorax. 2.  Cardiomegaly again noted.  Bibasilar atelectasis. Electronically Signed   By: Marcello Moores  Register   On: 10/05/2019 13:52   US RENAL  Result Date: 10/05/2019 CLINICAL DATA:  Chronic kidney disease stage 4 EXAM: RENAL / URINARY TRACT ULTRASOUND COMPLETE COMPARISON:  None. FINDINGS: Right Kidney: Renal measurements: 9.5 x 6.0 x 5.1 cm = volume: 153 mL. Cortical thinning. Increased echotexture. No mass or hydronephrosis. Small calcifications, likely nonobstructing stones. Left Kidney: Renal measurements: 9.5 x 4.6 x 5.1 cm = volume: 116 mL. Cortical thinning with increased echotexture. No mass or hydronephrosis. Small calcifications, likely nonobstructing stones. Bladder: Appears normal for degree of bladder distention. Other: None. IMPRESSION: No acute findings.  No hydronephrosis. Cortical thinning and increased echotexture. Bilateral nonobstructing nephrolithiasis. Electronically Signed   By: Rolm Baptise M.D.   On: 10/05/2019 15:25   US Venous Img Lower Bilateral (DVT)  Result Date: 10/04/2019 CLINICAL DATA:  Bilateral leg and knee pain. EXAM: BILATERAL LOWER EXTREMITY VENOUS DOPPLER ULTRASOUND TECHNIQUE: Gray-scale sonography with compression, as well as color and duplex ultrasound, were performed to evaluate the deep venous system(s) from the level of the common femoral vein through the popliteal and proximal calf veins. COMPARISON:  None recent FINDINGS: VENOUS Normal compressibility of the common femoral, superficial femoral, and popliteal veins, as well as the visualized calf veins. Visualized portions of profunda femoral vein and great saphenous vein unremarkable. No filling defects to suggest DVT on grayscale or color Doppler imaging. Doppler waveforms show normal direction of venous flow,  normal respiratory plasticity and response to augmentation. OTHER None. Limitations: none IMPRESSION: Negative. Electronically Signed   By: Constance Holster M.D.   On: 10/04/2019 16:12   US Abdomen Limited RUQ  Result Date: 10/04/2019 CLINICAL DATA:  Pain EXAM: ULTRASOUND ABDOMEN LIMITED RIGHT UPPER QUADRANT COMPARISON:  Ultrasound dated April 21, 2013. FINDINGS: Gallbladder: The gallbladder is distended with a small amount of gallbladder sludge. There is no evidence for cholelithiasis. There is no gallbladder wall thickening. The sonographic Percell Miller sign is negative. Common bile duct: Diameter: 4 mm Liver: Diffuse increased echogenicity with slightly heterogeneous liver. Appearance typically secondary to fatty infiltration. Fibrosis secondary consideration. No secondary findings of cirrhosis noted. No focal hepatic lesion or intrahepatic biliary duct dilatation. Portal vein is patent on color Doppler imaging  with normal direction of blood flow towards the liver. Other: None. IMPRESSION: 1. Distended gallbladder without evidence for cholelithiasis or acute cholecystitis. 2. Hepatic steatosis. 3. There is a small amount of gallbladder sludge within the gallbladder lumen. Electronically Signed   By: Constance Holster M.D.   On: 10/04/2019 16:13     Medications:    . amLODipine  10 mg Oral Daily  . amoxicillin-clavulanate  1 tablet Oral Q12H  . aspirin  81 mg Oral Daily  . atorvastatin  40 mg Oral QPM  . carvedilol  25 mg Oral BID  . donepezil  10 mg Oral QHS  . doxycycline  100 mg Oral Q12H  . enoxaparin (LOVENOX) injection  30 mg Subcutaneous Q24H  . insulin aspart  0-15 Units Subcutaneous TID WC  . insulin aspart  0-5 Units Subcutaneous QHS  . insulin glargine  8 Units Subcutaneous QHS  . levothyroxine  50 mcg Oral Q0600   acetaminophen **OR** acetaminophen, ondansetron **OR** ondansetron (ZOFRAN) IV, senna-docusate  Assessment/ Plan:  [name] is a 74 y.o. black female Ms.  [name] [name] is a 74 y.o. black female with hypothyroidism, hypertension, hyperlipidemia, diabetes mellitus type II insulin dependent, who was admitted to [facility] on 10/03/2019 for Right hip pain [M25.551] Cellulitis of right leg [M22.633] Foot ulceration, right, with unspecified severity ([place]) [L97.519] Acute kidney injury superimposed on CKD ([place]) [N17.9, N18.9]  1. Acute renal failure with hyperkalemia on chronic kidney disease stage IV with proteinuria: baseline creatinine of 2.31, GFR of 20 on 06/21/19.  Chronic kidney disease secondary to diabetic nephropathy Acute kidney injury most likely due to concurrent illness.  Renal ultrasound with no obstruction No IV contrast exposure.  - low potassium diet.  - discontinue IV fluids - holding quinapril.   2. Hypertension: with preserved systolic function. Home regimen of amlodipine, carvedilol, and quinapril.  Restarted amlodipine  3. Diabetes mellitus type II with chronic kidney disease: insulin dependent. Hemoglobin H5K of 5.6% Complicated by diabetic foot ulcer and charcot neuroarthropathy.   4. Anemia of chronic kidney disease:  - Pending SPEP/UPEP   LOS: 1 Lakeesha Fontanilla 6/30/20212:00 PM

## 2019-10-06 NOTE — Progress Notes (Signed)
   10/06/19 1900  Clinical Encounter Type  Visited With Patient  Visit Type Follow-up  Referral From Chaplain  Consult/Referral To Chaplain  Chaplain checked back in on patient and she was going to sleep. Chaplain told patient to have a good night and left.

## 2019-10-07 LAB — PROTEIN ELECTROPHORESIS, SERUM
A/G Ratio: 0.8 (ref 0.7–1.7)
Albumin ELP: 2.9 g/dL (ref 2.9–4.4)
Alpha-1-Globulin: 0.3 g/dL (ref 0.0–0.4)
Alpha-2-Globulin: 0.9 g/dL (ref 0.4–1.0)
Beta Globulin: 1 g/dL (ref 0.7–1.3)
Gamma Globulin: 1.6 g/dL (ref 0.4–1.8)
Globulin, Total: 3.7 g/dL (ref 2.2–3.9)
Total Protein ELP: 6.6 g/dL (ref 6.0–8.5)

## 2019-10-07 LAB — CBC WITH DIFFERENTIAL/PLATELET
Abs Immature Granulocytes: 0.07 10*3/uL (ref 0.00–0.07)
Basophils Absolute: 0 10*3/uL (ref 0.0–0.1)
Basophils Relative: 0 %
Eosinophils Absolute: 0 10*3/uL (ref 0.0–0.5)
Eosinophils Relative: 0 %
HCT: 28.9 % — ABNORMAL LOW (ref 36.0–46.0)
Hemoglobin: 9.4 g/dL — ABNORMAL LOW (ref 12.0–15.0)
Immature Granulocytes: 1 %
Lymphocytes Relative: 16 %
Lymphs Abs: 1.9 10*3/uL (ref 0.7–4.0)
MCH: 29.7 pg (ref 26.0–34.0)
MCHC: 32.5 g/dL (ref 30.0–36.0)
MCV: 91.5 fL (ref 80.0–100.0)
Monocytes Absolute: 1.5 10*3/uL — ABNORMAL HIGH (ref 0.1–1.0)
Monocytes Relative: 13 %
Neutro Abs: 8.5 10*3/uL — ABNORMAL HIGH (ref 1.7–7.7)
Neutrophils Relative %: 70 %
Platelets: 189 10*3/uL (ref 150–400)
RBC: 3.16 MIL/uL — ABNORMAL LOW (ref 3.87–5.11)
RDW: 13.4 % (ref 11.5–15.5)
WBC: 12 10*3/uL — ABNORMAL HIGH (ref 4.0–10.5)
nRBC: 0 % (ref 0.0–0.2)

## 2019-10-07 LAB — BASIC METABOLIC PANEL
Anion gap: 8 (ref 5–15)
BUN: 42 mg/dL — ABNORMAL HIGH (ref 8–23)
CO2: 18 mmol/L — ABNORMAL LOW (ref 22–32)
Calcium: 8.3 mg/dL — ABNORMAL LOW (ref 8.9–10.3)
Chloride: 112 mmol/L — ABNORMAL HIGH (ref 98–111)
Creatinine, Ser: 2.27 mg/dL — ABNORMAL HIGH (ref 0.44–1.00)
GFR calc Af Amer: 24 mL/min — ABNORMAL LOW (ref >60)
GFR calc non Af Amer: 21 mL/min — ABNORMAL LOW (ref >60)
Glucose, Bld: 148 mg/dL — ABNORMAL HIGH (ref 70–99)
Potassium: 4.2 mmol/L (ref 3.5–5.1)
Sodium: 138 mmol/L (ref 135–145)

## 2019-10-07 LAB — GLUCOSE, CAPILLARY
Glucose-Capillary: 117 mg/dL — ABNORMAL HIGH (ref 70–99)
Glucose-Capillary: 206 mg/dL — ABNORMAL HIGH (ref 70–99)

## 2019-10-07 MED ORDER — AMOXICILLIN-POT CLAVULANATE 500-125 MG PO TABS: 1.0000 | ORAL_TABLET | Freq: Two times a day (BID) | ORAL | 0 refills | Status: AC

## 2019-10-07 MED ORDER — POLYETHYLENE GLYCOL 3350 17 G PO PACK
17.0000 g | PACK | Freq: Every day | ORAL | Status: DC
  Administered 2019-10-07: 17 g via ORAL
  Filled 2019-10-07: qty 1

## 2019-10-07 MED ORDER — DOXYCYCLINE HYCLATE 100 MG PO TABS: 100.0000 mg | ORAL_TABLET | Freq: Two times a day (BID) | ORAL | 0 refills | Status: DC

## 2019-10-07 MED ORDER — BISACODYL 5 MG PO TBEC
5.0000 mg | DELAYED_RELEASE_TABLET | Freq: Every day | ORAL | Status: DC | PRN
  Administered 2019-10-07: 5 mg via ORAL
  Filled 2019-10-07: qty 1

## 2019-10-07 MED ORDER — MAGNESIUM CITRATE PO SOLN
1.0000 | Freq: Once | ORAL | Status: DC | PRN
  Filled 2019-10-07: qty 296

## 2019-10-07 NOTE — Discharge Summary (Signed)
Physician Discharge Summary  [name] [name] OTR:[id number] DOB: 11/26/1945 DOA: 10/03/2019  PCP: Marguerita Merles, MD  Admit date: 10/03/2019 Discharge date: 10/07/2019  Admitted From: home Disposition:  home  Recommendations for Outpatient Follow-up:  1. Follow up with PCP in 1-2 weeks 2. Please obtain BMP/CBC in one week 3. Please follow up with podiatry to ensure resolution of your right heel ulcer infection.  Home Health: PT, OT, RN, aide, SW  Equipment/Devices: wheelchair   Discharge Condition: Stable  CODE STATUS: Full  Diet recommendation Heart Healthy / Carb Modified    Discharge Diagnoses: Active Problems:   Right hip pain   Diabetic ulcer of right foot associated with type 2 diabetes mellitus, with fat layer exposed ([place])   Cellulitis of right foot   HTN (hypertension)   Type 2 diabetes mellitus with other specified complication (HCC)   Hypothyroidism (acquired)   UTI (urinary tract infection)   Leukocytosis   Fall at home, initial encounter   Cellulitis of right leg   CKD stage 4 due to type 2 diabetes mellitus ([place])   Acute kidney injury superimposed on CKD ([place])   RUQ pain   Hyperlipidemia   Hyperkalemia   Rib pain on left side    Summary of HPI and Hospital Course:  [name] [name] is a 74 y.o. 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  States that she did not fall but rather slid off the bed landing onto her right hip and now has right hip pain, 5 out of 10.  She denied any recent illnesses, fever or chills, or recent infections.  In the ED, vitals were normal.  Labs notable for WBC 21k, Cr 2.26 (close to baseline CKD), lactic acid 1.1.  UA negative for infection.  Chest xray negative.  CT pelvis negative for fracture, showed possible cystitis.   She was noted to have a R foot diabetic ulcer, xray of the foot showed plantar ulcer without definite evidence of osteomyelitis.  Patient started on Vancomycin and Rocephin for suspected diabetic ulcer infection.  Admitted to hospitalist service.    Diabetic foot ulcer with leukocytosis - WBC 21k concerning for infection, and no other source.  X-ray was negative for signs of osteomyelitis.  ESR slightly elevated at 6.5.  Procalcitonin elevated at 2.94, trending down.  Initially treated with IV vancomycin and Rocephin.  Continue Augmentin and doxycycline, 7 days remaining.  AKI superimposed on CKD stage IV -AKI present on admission likely due to prerenal azotemia.  Improved with hydration.  Nephrology following.  Baseline creatinine around 2.3, GFR 20 (as of March 2021).  Hyperkalemia - POA, secondary to above, resolved.  Treated with Lokelma.  Follow up BMP in 1 week.  Hypertension -continue home amlodipine, Coreg.    Type 2 diabetes -continue Lantus and sliding scale NovoLog.  Right hip and leg pain -present on admission after slipping out of bed at home.  CT scan of the pelvis on admission was negative for fracture and ultrasound negative for DVT.  PT and OT recommended SNF.  Patient declined.  Pt reported some improvement.  Requiring significant assistance for mobility, son agrees with providing care at home with home health.  Wheelchair provided.  She was previously ambulatory with cane.  She has had some delirium, avoid narcotics.  Tylenol PRN.  Outpatient  follow up.  Right upper quadrant pain - RUQ ultrasound was negative for signs of acute cholecystitis, but did show hepatic steatosis and small amount of gallbladder sludge.  Resolved.  Left rib pain -x-ray negative for fractures.  SPEP was ordered and pending.  Hyperlipidemia -continue Lipitor  Hypothyroidism -continue levothyroxine  Mild memory loss -continue Aricept  Obesity: Body mass index is 33.45 kg/m. Complicates  overall care and prognosis.   Discharge Instructions   Discharge Instructions    Call MD for:   Complete by: As directed    Foot wound starts draining purulent / pussy fluid or becomes more red / swollen around the wound or pain increasing.   Call MD for:  extreme fatigue   Complete by: As directed    Call MD for:  persistant dizziness or light-headedness   Complete by: As directed    Call MD for:  severe uncontrolled pain   Complete by: As directed    Call MD for:  temperature >100.4   Complete by: As directed    Diet - low sodium heart healthy   Complete by: As directed    Discharge instructions   Complete by: As directed    Take both antibiotics (Augmentin and Doxycycline) twice daily for the next 7 days. Please follow up with podiatry for your foot ulcer in 1 week to make sure the infection has resolved.   Discharge wound care:   Complete by: As directed    Cleanse wound with soap and water, cover with betadine gauze and padded dressing every other day.   Increase activity slowly   Complete by: As directed      Allergies as of 10/07/2019   No Known Allergies     Medication List    STOP taking these medications   predniSONE 10 MG tablet Commonly known as: DELTASONE     TAKE these medications   amLODipine 10 MG tablet Commonly known as: NORVASC Take 1 tablet by mouth daily.   amoxicillin-clavulanate 500-125 MG tablet Commonly known as: AUGMENTIN Take 1 tablet (500 mg total) by mouth every 12 (twelve) hours for 7 days.   ASPIRIN 81 PO Take by mouth daily.   atorvastatin 40 MG tablet Commonly known as: LIPITOR Take 1 tablet by mouth daily.   carvedilol 25 MG tablet Commonly known as: COREG Take 1 tablet by mouth 2 (two) times daily.   donepezil 10 MG tablet Commonly known as: ARICEPT Take 10 mg by mouth daily.   doxycycline 100 MG tablet Commonly known as: VIBRA-TABS Take 1 tablet (100 mg total) by mouth every 12 (twelve) hours for 7 days.   insulin  aspart protamine- aspart (70-30) 100 UNIT/ML injection Commonly known as: NOVOLOG MIX 70/30 Inject 15 Units into the skin daily with breakfast.   Synthroid 50 MCG tablet Generic drug: levothyroxine Take 1 tablet by mouth daily.   Tyler Aas FlexTouch 100 UNIT/ML FlexTouch Pen Generic drug: insulin degludec Inject 80 Units into the skin at bedtime.            Durable Medical Equipment  (From admission, onward)         Start     Ordered   10/05/19 1326  For home use only DME standard manual wheelchair with seat cushion  Once       Comments: Patient suffers from diabetic foot ulcer which impairs their ability to perform daily activities like walking in the home.  A walker will not resolve issue with performing activities of daily living. A  wheelchair will allow patient to safely perform daily activities. Patient can safely propel the wheelchair in the home or has a caregiver who can provide assistance. Length of need lifetime. Accessories: elevating leg rests (ELRs), wheel locks, extensions and anti-tippers.   10/05/19 1326           Discharge Care Instructions  (From admission, onward)         Start     Ordered   10/07/19 0000  Discharge wound care:       Comments: Cleanse wound with soap and water, cover with betadine gauze and padded dressing every other day.   10/07/19 1326          No Known Allergies  Consultations:  Nephrology    Procedures/Studies: DG Chest 1 View  Result Date: 10/03/2019 CLINICAL DATA:  Cough EXAM: CHEST  1 VIEW COMPARISON:  05/13/2019 FINDINGS: Mild cardiomegaly. Lungs clear. No effusions. No acute bony abnormality. IMPRESSION: No active disease. Electronically Signed   By: Rolm Baptise M.D.   On: 10/03/2019 23:00   DG Ribs Unilateral Left  Result Date: 10/05/2019 CLINICAL DATA:  Left rib pain.  No injury. EXAM: LEFT RIBS - 2 VIEW COMPARISON:  10/03/2019. FINDINGS: No acute or focal bony abnormality identified. No evidence of displaced  rib fracture or pneumothorax. Degenerative change thoracic spine. Cardiomegaly again noted. Bibasilar atelectasis. IMPRESSION: 1. No evidence of displaced rib fracture or focal bony abnormality identified. No pneumothorax. 2.  Cardiomegaly again noted.  Bibasilar atelectasis. Electronically Signed   By: Marcello Moores  Register   On: 10/05/2019 13:52   CT PELVIS WO CONTRAST  Result Date: 10/03/2019 CLINICAL DATA:  Right hip pain following a fall this afternoon. EXAM: CT PELVIS WITHOUT CONTRAST TECHNIQUE: Multidetector CT imaging of the pelvis was performed following the standard protocol without intravenous contrast. COMPARISON:  Pelvis and right hip radiographs obtained earlier today. Abdomen and pelvis CT dated 04/20/2013. FINDINGS: Urinary Tract: Mild diffuse bladder wall thickening. No bladder or ureteral calculi seen. Bowel: Mildly prominent stool in the rectum and distal sigmoid colon. No dilated bowel loops. Vascular/Lymphatic: Atheromatous arterial calcifications without aneurysm. No enlarged lymph nodes. Reproductive:  Unremarkable uterus. No adnexal masses. Other:  None. Musculoskeletal: No pelvic or hip fracture or dislocation. Lower lumbar spine degenerative changes. IMPRESSION: 1. No fracture or dislocation. 2. Mild diffuse bladder wall thickening, possibly due to cystitis. Electronically Signed   By: Claudie Revering M.D.   On: 10/03/2019 18:35   US RENAL  Result Date: 10/05/2019 CLINICAL DATA:  Chronic kidney disease stage 4 EXAM: RENAL / URINARY TRACT ULTRASOUND COMPLETE COMPARISON:  None. FINDINGS: Right Kidney: Renal measurements: 9.5 x 6.0 x 5.1 cm = volume: 153 mL. Cortical thinning. Increased echotexture. No mass or hydronephrosis. Small calcifications, likely nonobstructing stones. Left Kidney: Renal measurements: 9.5 x 4.6 x 5.1 cm = volume: 116 mL. Cortical thinning with increased echotexture. No mass or hydronephrosis. Small calcifications, likely nonobstructing stones. Bladder: Appears normal  for degree of bladder distention. Other: None. IMPRESSION: No acute findings.  No hydronephrosis. Cortical thinning and increased echotexture. Bilateral nonobstructing nephrolithiasis. Electronically Signed   By: Rolm Baptise M.D.   On: 10/05/2019 15:25   US Venous Img Lower Bilateral (DVT)  Result Date: 10/04/2019 CLINICAL DATA:  Bilateral leg and knee pain. EXAM: BILATERAL LOWER EXTREMITY VENOUS DOPPLER ULTRASOUND TECHNIQUE: Gray-scale sonography with compression, as well as color and duplex ultrasound, were performed to evaluate the deep venous system(s) from the level of the common femoral vein through the popliteal and  proximal calf veins. COMPARISON:  None recent FINDINGS: VENOUS Normal compressibility of the common femoral, superficial femoral, and popliteal veins, as well as the visualized calf veins. Visualized portions of profunda femoral vein and great saphenous vein unremarkable. No filling defects to suggest DVT on grayscale or color Doppler imaging. Doppler waveforms show normal direction of venous flow, normal respiratory plasticity and response to augmentation. OTHER None. Limitations: none IMPRESSION: Negative. Electronically Signed   By: Constance Holster M.D.   On: 10/04/2019 16:12   DG Foot Complete Right  Result Date: 10/03/2019 CLINICAL DATA:  Ulcer on the medial side of the heel. EXAM: RIGHT FOOT COMPLETE - 3+ VIEW COMPARISON:  April 24, 2018 FINDINGS: Again noted are advanced degenerative changes of the first metatarsophalangeal joint. There is an old healed fracture of the fourth metatarsal head. Again noted is a deformity of the midfoot and hindfoot as previously seen. There is a plantar ulcer with overlying soft tissue edema. There is no definite radiographic evidence for osteomyelitis. IMPRESSION: 1. Plantar ulcer without definite radiographic evidence for osteomyelitis. 2. Stable mid and hindfoot deformity. Electronically Signed   By: Constance Holster M.D.   On: 10/03/2019  23:51   DG Hip Unilat W or Wo Pelvis 2-3 Views Right  Result Date: 10/03/2019 CLINICAL DATA:  Fall, right hip pain EXAM: DG HIP (WITH OR WITHOUT PELVIS) 2-3V RIGHT COMPARISON:  None. FINDINGS: No visible hip fracture, subluxation or dislocation. Mild degenerative changes in the hips bilaterally. SI joints symmetric and unremarkable. IMPRESSION: No visible acute bony abnormality. Electronically Signed   By: Rolm Baptise M.D.   On: 10/03/2019 17:47   US Abdomen Limited RUQ  Result Date: 10/04/2019 CLINICAL DATA:  Pain EXAM: ULTRASOUND ABDOMEN LIMITED RIGHT UPPER QUADRANT COMPARISON:  Ultrasound dated April 21, 2013. FINDINGS: Gallbladder: The gallbladder is distended with a small amount of gallbladder sludge. There is no evidence for cholelithiasis. There is no gallbladder wall thickening. The sonographic Percell Miller sign is negative. Common bile duct: Diameter: 4 mm Liver: Diffuse increased echogenicity with slightly heterogeneous liver. Appearance typically secondary to fatty infiltration. Fibrosis secondary consideration. No secondary findings of cirrhosis noted. No focal hepatic lesion or intrahepatic biliary duct dilatation. Portal vein is patent on color Doppler imaging with normal direction of blood flow towards the liver. Other: None. IMPRESSION: 1. Distended gallbladder without evidence for cholelithiasis or acute cholecystitis. 2. Hepatic steatosis. 3. There is a small amount of gallbladder sludge within the gallbladder lumen. Electronically Signed   By: Constance Holster M.D.   On: 10/04/2019 16:13         Subjective: Patient seen this AM.  No acute events.  She reports feeling well and is again asking to go home.  Denies significant pain, says overall improved.  No fevers or chills.  No pain in the right heel.  Stable for discharge with home health and family caregivers.  She again declines to consider rehab.   Discharge Exam: Vitals:   10/06/19 2212 10/07/19 0743  BP: (!) 146/71 (!)  156/75  Pulse: 77 78  Resp: 18 16  Temp: 99 F (37.2 C) (!) 97.5 F (36.4 C)  SpO2: 94% 94%   Vitals:   10/06/19 0732 10/06/19 1554 10/06/19 2212 10/07/19 0743  BP: (!) 144/72 (!) 162/79 (!) 146/71 (!) 156/75  Pulse: 73 (!) 51 77 78  Resp: '17 17 18 16  ' Temp: 99.3 F (37.4 C) 99.5 F (37.5 C) 99 F (37.2 C) (!) 97.5 F (36.4 C)  TempSrc: Oral Oral  Oral Oral  SpO2: 95% 95% 94% 94%  Weight:      Height:        General: Pt is alert, awake, not in acute distress, obese Cardiovascular: RRR, S1/S2 +, no rubs, no gallops Respiratory: CTA bilaterally, no wheezing, no rhonchi Abdominal: Soft, NT, ND, bowel sounds + Extremities: right heel ulcer without erythema or purulence, no edema, no cyanosis    The results of significant diagnostics from this hospitalization (including imaging, microbiology, ancillary and laboratory) are listed below for reference.     Microbiology: Recent Results (from the past 240 hour(s))  Blood culture (routine x 2)     Status: None (Preliminary result)   Collection Time: 10/03/19  8:13 PM   Specimen: BLOOD  Result Value Ref Range Status   Specimen Description BLOOD LEFT HAND  Final   Special Requests   Final    BOTTLES DRAWN AEROBIC AND ANAEROBIC Blood Culture results may not be optimal due to an inadequate volume of blood received in culture bottles   Culture   Final    NO GROWTH 4 DAYS Performed at [facility], [address] [place], [place] [address]    Report Status PENDING  Incomplete  Blood culture (routine x 2)     Status: None (Preliminary result)   Collection Time: 10/03/19  8:13 PM   Specimen: BLOOD  Result Value Ref Range Status   Specimen Description BLOOD LEFT [facility]  Final   Special Requests   Final    BOTTLES DRAWN AEROBIC AND ANAEROBIC Blood Culture adequate volume   Culture   Final    NO GROWTH 4 DAYS Performed at [facility], [address], [place], [place] [address]    Report Status PENDING   Incomplete  Urine culture     Status: None   Collection Time: 10/03/19 10:18 PM   Specimen: Urine, Random  Result Value Ref Range Status   Specimen Description   Final    URINE, RANDOM Performed at [facility], [address], [place], [place] [address]    Special Requests   Final    NONE Performed at [facility], [address] [place], [place] [address]    Culture   Final    NO GROWTH Performed at [facility], [place] [address] [place], [place] [address]    Report Status 10/05/2019 FINAL  Final  Resp Panel by RT PCR (RSV, Flu A&B, Covid) - Nasopharyngeal Swab     Status: None   Collection Time: 10/04/19 12:47 AM   Specimen: Nasopharyngeal Swab  Result Value Ref Range Status   SARS Coronavirus 2 by RT PCR NEGATIVE NEGATIVE Final    Comment: (NOTE) SARS-CoV-2 target nucleic acids are NOT DETECTED.  The SARS-CoV-2 RNA is generally detectable in upper respiratoy specimens during the acute phase of infection. The lowest concentration of SARS-CoV-2 viral copies this assay can detect is 131 copies/mL. A negative result does not preclude SARS-Cov-2 infection and should not be used as the sole basis for treatment or other patient management decisions. A negative result may occur with  improper specimen collection/handling, submission of specimen other than nasopharyngeal swab, presence of viral mutation(s) within the areas targeted by this assay, and inadequate number of viral copies (<131 copies/mL). A negative result must be combined with clinical observations, patient history, and epidemiological information. The expected result is Negative.  Fact Sheet for Patients:  PinkCheek.be  Fact Sheet for Healthcare Providers:  GravelBags.it  This test is  no t yet approved or cleared by the [place] and  has been authorized for detection and/or diagnosis of SARS-CoV-2 by FDA under an  Emergency Use Authorization (EUA). This EUA will remain  in effect (meaning this test can be used) for the duration of the COVID-19 declaration under Section 564(b)(1) of the Act, 21 U.S.C. section 360bbb-3(b)(1), unless the authorization is terminated or revoked sooner.     Influenza A by PCR NEGATIVE NEGATIVE Final   Influenza B by PCR NEGATIVE NEGATIVE Final    Comment: (NOTE) The Xpert Xpress SARS-CoV-2/FLU/RSV assay is intended as an aid in  the diagnosis of influenza from Nasopharyngeal swab specimens and  should not be used as a sole basis for treatment. Nasal washings and  aspirates are unacceptable for Xpert Xpress SARS-CoV-2/FLU/RSV  testing.  Fact Sheet for Patients: PinkCheek.be  Fact Sheet for Healthcare Providers: GravelBags.it  This test is not yet approved or cleared by the [place] FDA and  has been authorized for detection and/or diagnosis of SARS-CoV-2 by  FDA under an Emergency Use Authorization (EUA). This EUA will remain  in effect (meaning this test can be used) for the duration of the  Covid-19 declaration under Section 564(b)(1) of the Act, 21  U.S.C. section 360bbb-3(b)(1), unless the authorization is  terminated or revoked.    Respiratory Syncytial Virus by PCR NEGATIVE NEGATIVE Final    Comment: (NOTE) Fact Sheet for Patients: PinkCheek.be  Fact Sheet for Healthcare Providers: GravelBags.it  This test is not yet approved or cleared by the [place] FDA and  has been authorized for detection and/or diagnosis of SARS-CoV-2 by  FDA under an Emergency Use Authorization (EUA). This EUA will remain  in effect (meaning this test can be used) for the duration of the  COVID-19 declaration under Section 564(b)(1) of the Act, 21 U.S.C.  section 360bbb-3(b)(1), unless the authorization is terminated or  revoked. Performed at [facility], [address] [place], [place] [address]      Labs: BNP (last 3 results) No results for input(s): BNP in the last 8760 hours. Basic Metabolic Panel: Recent Labs  Lab 10/03/19 1720 10/04/19 0503 10/05/19 0313 10/06/19 0443 10/07/19 0403  NA 140 137 138 137 138  K 4.7 5.5* 4.8 4.2 4.2  CL 111 108 108 109 112*  CO2 17* 18* 20* 18* 18*  GLUCOSE 157* 204* 147* 160* 148*  BUN 35* 41* 50* 45* 42*  CREATININE 2.26* 2.53* 2.87* 2.54* 2.27*  CALCIUM 9.1 8.7* 8.3* 8.0* 8.3*   Liver Function Tests: Recent Labs  Lab 10/03/19 1720  AST 24  ALT 16  ALKPHOS 96  BILITOT 0.9  PROT 8.0  ALBUMIN 3.7   No results for input(s): LIPASE, AMYLASE in the last 168 hours. No results for input(s): AMMONIA in the last 168 hours. CBC: Recent Labs  Lab 10/03/19 1649 10/04/19 0503 10/05/19 0313 10/07/19 0403  WBC 21.9* 19.0* 14.9* 12.0*  NEUTROABS 18.5*  --   --  8.5*  HGB 11.9* 10.7* 10.6* 9.4*  HCT 34.5* 32.7* 31.0* 28.9*  MCV 88.9 92.6 89.1 91.5  PLT 204 187 179 189   Cardiac Enzymes: Recent Labs  Lab 10/04/19 0503  CKTOTAL 208   BNP: Invalid input(s): POCBNP CBG: Recent Labs  Lab 10/06/19 0734 10/06/19 1142 10/06/19 1631 10/07/19 0745 10/07/19 1144  GLUCAP 155* 149* 184* 117* 206*   D-Dimer No results for input(s): DDIMER in the last 72 hours. Hgb A1c No results for input(s): HGBA1C  in the last 72 hours. Lipid Profile No results for input(s): CHOL, HDL, LDLCALC, TRIG, CHOLHDL, LDLDIRECT in the last 72 hours. Thyroid function studies No results for input(s): TSH, T4TOTAL, T3FREE, THYROIDAB in the last 72 hours.  Invalid input(s): FREET3 Anemia work up No results for input(s): VITAMINB12, FOLATE, FERRITIN, TIBC, IRON, RETICCTPCT in the last 72 hours. Urinalysis    Component Value Date/Time   COLORURINE YELLOW (A) 10/03/2019 2218   APPEARANCEUR CLOUDY (A) 10/03/2019 2218   APPEARANCEUR Clear 04/21/2013 0715   LABSPEC 1.014 10/03/2019 2218    LABSPEC 1.015 04/21/2013 0715   PHURINE 5.0 10/03/2019 2218   GLUCOSEU NEGATIVE 10/03/2019 2218   GLUCOSEU 50 mg/dL 04/21/2013 0715   HGBUR NEGATIVE 10/03/2019 2218   BILIRUBINUR NEGATIVE 10/03/2019 2218   BILIRUBINUR Negative 04/21/2013 [place] NEGATIVE 10/03/2019 2218   PROTEINUR 100 (A) 10/03/2019 2218   NITRITE NEGATIVE 10/03/2019 2218   LEUKOCYTESUR NEGATIVE 10/03/2019 2218   LEUKOCYTESUR Negative 04/21/2013 0715   Sepsis Labs Invalid input(s): PROCALCITONIN,  WBC,  LACTICIDVEN Microbiology Recent Results (from the past 240 hour(s))  Blood culture (routine x 2)     Status: None (Preliminary result)   Collection Time: 10/03/19  8:13 PM   Specimen: BLOOD  Result Value Ref Range Status   Specimen Description BLOOD LEFT HAND  Final   Special Requests   Final    BOTTLES DRAWN AEROBIC AND ANAEROBIC Blood Culture results may not be optimal due to an inadequate volume of blood received in culture bottles   Culture   Final    NO GROWTH 4 DAYS Performed at [facility], [address] [place], [place] [address]    Report Status PENDING  Incomplete  Blood culture (routine x 2)     Status: None (Preliminary result)   Collection Time: 10/03/19  8:13 PM   Specimen: BLOOD  Result Value Ref Range Status   Specimen Description BLOOD LEFT [facility]  Final   Special Requests   Final    BOTTLES DRAWN AEROBIC AND ANAEROBIC Blood Culture adequate volume   Culture   Final    NO GROWTH 4 DAYS Performed at [facility], [address], [place], [place] [address]    Report Status PENDING  Incomplete  Urine culture     Status: None   Collection Time: 10/03/19 10:18 PM   Specimen: Urine, Random  Result Value Ref Range Status   Specimen Description   Final    URINE, RANDOM Performed at [facility], [address], [place], [place] [address]    Special Requests   Final    NONE Performed at [facility], [address] [place], [place] [address]     Culture   Final    NO GROWTH Performed at [facility], [place] [address], [place], [place] [address]    Report Status 10/05/2019 FINAL  Final  Resp Panel by RT PCR (RSV, Flu A&B, Covid) - Nasopharyngeal Swab     Status: None   Collection Time: 10/04/19 12:47 AM   Specimen: Nasopharyngeal Swab  Result Value Ref Range Status   SARS Coronavirus 2 by RT PCR NEGATIVE NEGATIVE Final    Comment: (NOTE) SARS-CoV-2 target nucleic acids are NOT DETECTED.  The SARS-CoV-2 RNA is generally detectable in upper respiratoy specimens during the acute phase of infection. The lowest concentration of SARS-CoV-2 viral copies this assay can detect is 131 copies/mL. A negative result does not preclude SARS-Cov-2 infection and should not be  used as the sole basis for treatment or other patient management decisions. A negative result may occur with  improper specimen collection/handling, submission of specimen other than nasopharyngeal swab, presence of viral mutation(s) within the areas targeted by this assay, and inadequate number of viral copies (<131 copies/mL). A negative result must be combined with clinical observations, patient history, and epidemiological information. The expected result is Negative.  Fact Sheet for Patients:  PinkCheek.be  Fact Sheet for Healthcare Providers:  GravelBags.it  This test is no t yet approved or cleared by the [place] FDA and  has been authorized for detection and/or diagnosis of SARS-CoV-2 by FDA under an Emergency Use Authorization (EUA). This EUA will remain  in effect (meaning this test can be used) for the duration of the COVID-19 declaration under Section 564(b)(1) of the Act, 21 U.S.C. section 360bbb-3(b)(1), unless the authorization is terminated or revoked sooner.     Influenza A by PCR NEGATIVE NEGATIVE Final   Influenza B by PCR NEGATIVE NEGATIVE Final    Comment: (NOTE) The  Xpert Xpress SARS-CoV-2/FLU/RSV assay is intended as an aid in  the diagnosis of influenza from Nasopharyngeal swab specimens and  should not be used as a sole basis for treatment. Nasal washings and  aspirates are unacceptable for Xpert Xpress SARS-CoV-2/FLU/RSV  testing.  Fact Sheet for Patients: PinkCheek.be  Fact Sheet for Healthcare Providers: GravelBags.it  This test is not yet approved or cleared by the [place] FDA and  has been authorized for detection and/or diagnosis of SARS-CoV-2 by  FDA under an Emergency Use Authorization (EUA). This EUA will remain  in effect (meaning this test can be used) for the duration of the  Covid-19 declaration under Section 564(b)(1) of the Act, 21  U.S.C. section 360bbb-3(b)(1), unless the authorization is  terminated or revoked.    Respiratory Syncytial Virus by PCR NEGATIVE NEGATIVE Final    Comment: (NOTE) Fact Sheet for Patients: PinkCheek.be  Fact Sheet for Healthcare Providers: GravelBags.it  This test is not yet approved or cleared by the [place] FDA and  has been authorized for detection and/or diagnosis of SARS-CoV-2 by  FDA under an Emergency Use Authorization (EUA). This EUA will remain  in effect (meaning this test can be used) for the duration of the  COVID-19 declaration under Section 564(b)(1) of the Act, 21 U.S.C.  section 360bbb-3(b)(1), unless the authorization is terminated or  revoked. Performed at [facility], [address] [place], [place] [address]      Time coordinating discharge: Over 30 minutes  SIGNED:   Ezekiel Slocumb, DO [facility] 10/07/2019, 1:26 PM   If 7PM-7AM, please contact night-coverage [email or web address]

## 2019-10-07 NOTE — Evaluation (Signed)
Occupational Therapy Evaluation Patient Details Name: [name] [name] MRN: [id number] DOB: 10-13-1945 Today's Date: 10/07/2019    History of Present Illness [name] [name] is a 74 y.o. 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  She denies any complaints other than right hip pain.   Clinical Impression   Pt was seen for OT evaluation this date. Prior to hospital admission, pt was requiring some assist for self care from PCA 3x/wk, and was MOD I for fxl mobility with SPC. Pt lives with son in [facility] with 4 STE (son was assisting pt up steps as well). Currently pt demonstrates impairments as described below (See OT problem list) which functionally limit her ability to perform ADL/self-care tasks. Pt currently requiring increased assist for t/f's d/t b/l knee pain: MAX A for sit to stand from elevated surface, MAX +2 from chair. In addition, increased assist req'ed for self care: MAX A +2 for peri care/toileting, MAX A for LB ADLs including dressing, and setup to MIN A for UB ADLs d/t R shoulder pain.  Pt would benefit from skilled OT to address noted impairments and functional limitations (see below for any additional details) in order to maximize safety and independence while minimizing falls risk and caregiver burden. Upon hospital discharge, STR seems safest/most appropriate discharge recommendation from an OT standpoint, but other members of pt's healthcare team including PT and RN- share that pt's son ideally wants to continue to manage pt's care at home. Will continue to follow while pt is here in acute setting.   Follow Up Recommendations  SNF    Equipment Recommendations  3 in 1 bedside commode;Other (comment) (2WW and w/c)    Recommendations for Other Services       Precautions / Restrictions  Precautions Precautions: Fall Other Brace: Darco shoe on Right, orthopedic shoe on Left Restrictions Other Position/Activity Restrictions: "Minimize AMB to promote healing" per podiatry      Mobility Bed Mobility Overal bed mobility: Needs Assistance Bed Mobility: Sit to Supine       Sit to supine: Max assist   General bed mobility comments: extended time to mobilize LEs toward EOB, MAX A to manage trunk for sup to sit.  Transfers Overall transfer level: Needs assistance Equipment used: Rolling walker (2 wheeled);None Transfers: Sit to/from [employer] to Stand: Max assist;+2 safety/equipment (MAX A +2 for 2 sit to stands from recliner chair height while getting assist with peri care and then to replace pads.) Stand pivot transfers: Max assist;From elevated surface (from eleavted EOB to chair adjacent to pt's Right)       General transfer comment: extended time and assist to lean forward and for foot placement from OT prior to even attempting transfer.    Balance Overall balance assessment: Needs assistance Sitting-balance support: Bilateral upper extremity supported;Feet supported Sitting balance-Leahy Scale: Poor     Standing balance support: During functional activity;Bilateral upper extremity supported Standing balance-Leahy Scale: Poor Standing balance comment: extensive external support and reliance on RW to sustain static stand.                           ADL either performed or assessed with clinical judgement   ADL Overall ADL's : Needs assistance/impaired  General ADL Comments: MAX A for LB ADLs d/t bilateral knee pain, setup to MIN A for UB bathing/dressing/grooming in sitting d/t R UE pain. Setup for self-feeding. MAX A +2 for peri care after BM at this time as pt with heavy use of RW and external support to sustain static stand while second person compeltes peri care.      Vision Patient Visual Report: No change from baseline       Perception     Praxis      Pertinent Vitals/Pain Pain Assessment: Faces Faces Pain Scale: Hurts even more Pain Location: bilat knees and R UE Pain Descriptors / Indicators: Aching;Grimacing;Guarding Pain Intervention(s): Limited activity within patient's tolerance;Monitored during session;Repositioned     Hand Dominance Left   Extremity/Trunk Assessment Upper Extremity Assessment Upper Extremity Assessment: RUE deficits/detail;LUE deficits/detail RUE Deficits / Details: unable to tolerate AROM for shoulder flexion d/t c/o severe pain when attempting (notified RN and MD). OT has to place pt's R hand on RW. Pt's grip on R side grossly 3+/5 LUE Deficits / Details: ROM WFL, generalized weaknes, MMT grossly 4-/5   Lower Extremity Assessment Lower Extremity Assessment: Generalized weakness (noted wrap/dressing to R foot)       Communication Communication Communication: No difficulties   Cognition Arousal/Alertness: Awake/alert Behavior During Therapy: WFL for tasks assessed/performed Overall Cognitive Status: Within Functional Limits for tasks assessed                                 General Comments: While pt is appropriate throughout, some conflicting information re: PLOF with ADLs. Ex: pt reports that she performs all aspects of dressing, bathing and toileting I'ly. but per her son (in PT note) she has PCA 3x/wk for ADL help   General Comments       Exercises Other Exercises Other Exercises: OT facilitates education with pt re: fall alarm in chair and call light use. Pt with good reception.   Shoulder Instructions      Home Living Family/patient expects to be discharged to:: Private residence Living Arrangements: Children (son) Available Help at Discharge: Family;Personal care attendant (Son works PT; pt has Engineer, production in home MWF 9-11:30 for ADL assist.) Type of Home: House Home Access: Stairs  to enter Technical brewer of Steps: 4 Entrance Stairs-Rails: Left;Right Home Layout: One level     Bathroom Shower/Tub: Tub only         Home Equipment: Deep Water - single point;Shower seat   Additional Comments: Denies any falls history in the last 6 months, but fell in Jan 2020 c Left humeral fracture.      Prior Functioning/Environment Level of Independence: Independent with assistive device(s)        Comments: SPC use for longer distances; son has helped with stairs at entry for >1 year. Uses Darco shoe for foot ulcer, but unclear about weight bearing recommendations.        OT Problem List: Decreased strength;Decreased range of motion;Decreased activity tolerance;Impaired balance (sitting and/or standing);Decreased safety awareness;Decreased knowledge of use of DME or AE;Obesity;Pain;Impaired UE functional use      OT Treatment/Interventions: Self-care/ADL training;Therapeutic exercise;DME and/or AE instruction;Therapeutic activities;Balance training;Patient/family education;Energy conservation    OT Goals(Current goals can be found in the care plan section) Acute Rehab OT Goals Patient Stated Goal: regain strength, reduce pain, return to home OT Goal Formulation: With patient Time For Goal Achievement: 10/21/19 Potential to Achieve Goals: Good  OT Frequency: Min 2X/week  Barriers to D/C:            Co-evaluation              AM-PAC OT "6 Clicks" Daily Activity     Outcome Measure Help from another person eating meals?: A Little Help from another person taking care of personal grooming?: A Little Help from another person toileting, which includes using toliet, bedpan, or urinal?: Total Help from another person bathing (including washing, rinsing, drying)?: A Lot Help from another person to put on and taking off regular upper body clothing?: A Little Help from another person to put on and taking off regular lower body clothing?: A Lot 6 Click Score:  14   End of Session Equipment Utilized During Treatment: Gait belt;Rolling walker Nurse Communication: Mobility status  Activity Tolerance: Patient tolerated treatment well Patient left: with call bell/phone within reach;with chair alarm set  OT Visit Diagnosis: Unsteadiness on feet (R26.81);Muscle weakness (generalized) (M62.81);Low vision, both eyes (H54.2);Pain Pain - Right/Left: Right Pain - part of body: Shoulder (and bilateral knees)                Time: 9396-8864 OT Time Calculation (min): 62 min Charges:  OT General Charges $OT Visit: 1 Visit OT Evaluation $OT Eval Moderate Complexity: 1 Mod OT Treatments $Self Care/Home Management : 8-22 mins $Therapeutic Activity: 23-37 mins  Gerrianne Scale, MS, OTR/L ascom [phone] 10/07/19, 2:20 PM

## 2019-10-07 NOTE — Progress Notes (Signed)
This nurse explained to patient and son at bedside instructions on ABT treatment and all discharged instructions. Recall given from patient and son. Patient transport vis w/c with son.

## 2019-10-07 NOTE — Progress Notes (Signed)
[facility]  ROUNDING NOTE   Subjective:   Working with physical therapy.   CO2 18 Creatinine 2.27 (2.54)  Placed on Augmentin  Objective:  Vital signs in last 24 hours:  Temp:  [97.5 F (36.4 C)-99.5 F (37.5 C)] 97.5 F (36.4 C) (07/01 0743) Pulse Rate:  [51-78] 78 (07/01 0743) Resp:  [16-18] 16 (07/01 0743) BP: (146-162)/(71-79) 156/75 (07/01 0743) SpO2:  [94 %-95 %] 94 % (07/01 0743)  Weight change:  Filed Weights   10/03/19 1646 10/04/19 0329  Weight: 93.9 kg 96.9 kg    Intake/Output: I/O last 3 completed shifts: In: -  Out: 1225 [Urine:1225]   Intake/Output this shift:  Total I/O In: 120 [P.O.:120] Out: -   Physical Exam: General: NAD,   Head: Normocephalic, atraumatic. Moist oral mucosal membranes  Eyes: Anicteric, PERRL  Neck: Supple, trachea midline  Lungs:  Clear to auscultation  Heart: Regular rate and rhythm  Abdomen:  Soft, nontender,   Extremities:  no peripheral edema.  Neurologic: Nonfocal, moving all four extremities  Skin: No lesions        Basic Metabolic Panel: Recent Labs  Lab 10/03/19 1720 10/03/19 1720 10/04/19 0503 10/04/19 0503 10/05/19 0313 10/06/19 0443 10/07/19 0403  NA 140  --  137  --  138 137 138  K 4.7  --  5.5*  --  4.8 4.2 4.2  CL 111  --  108  --  108 109 112*  CO2 17*  --  18*  --  20* 18* 18*  GLUCOSE 157*  --  204*  --  147* 160* 148*  BUN 35*  --  41*  --  50* 45* 42*  CREATININE 2.26*  --  2.53*  --  2.87* 2.54* 2.27*  CALCIUM 9.1   < > 8.7*   < > 8.3* 8.0* 8.3*   < > = values in this interval not displayed.    Liver Function Tests: Recent Labs  Lab 10/03/19 1720  AST 24  ALT 16  ALKPHOS 96  BILITOT 0.9  PROT 8.0  ALBUMIN 3.7   No results for input(s): LIPASE, AMYLASE in the last 168 hours. No results for input(s): AMMONIA in the last 168 hours.  CBC: Recent Labs  Lab 10/03/19 1649 10/04/19 0503 10/05/19 0313 10/07/19 0403  WBC 21.9* 19.0* 14.9* 12.0*  NEUTROABS 18.5*   --   --  8.5*  HGB 11.9* 10.7* 10.6* 9.4*  HCT 34.5* 32.7* 31.0* 28.9*  MCV 88.9 92.6 89.1 91.5  PLT 204 187 179 189    Cardiac Enzymes: Recent Labs  Lab 10/04/19 0503  CKTOTAL 208    BNP: Invalid input(s): POCBNP  CBG: Recent Labs  Lab 10/06/19 0734 10/06/19 1142 10/06/19 1631 10/07/19 0745 10/07/19 1144  GLUCAP 155* 149* 184* 117* 101*    Microbiology: Results for orders placed or performed during the hospital encounter of 10/03/19  Blood culture (routine x 2)     Status: None (Preliminary result)   Collection Time: 10/03/19  8:13 PM   Specimen: BLOOD  Result Value Ref Range Status   Specimen Description BLOOD LEFT HAND  Final   Special Requests   Final    BOTTLES DRAWN AEROBIC AND ANAEROBIC Blood Culture results may not be optimal due to an inadequate volume of blood received in culture bottles   Culture   Final    NO GROWTH 4 DAYS Performed at [facility], [address], [place], [place] [address]    Report Status  PENDING  Incomplete  Blood culture (routine x 2)     Status: None (Preliminary result)   Collection Time: 10/03/19  8:13 PM   Specimen: BLOOD  Result Value Ref Range Status   Specimen Description BLOOD LEFT [facility]  Final   Special Requests   Final    BOTTLES DRAWN AEROBIC AND ANAEROBIC Blood Culture adequate volume   Culture   Final    NO GROWTH 4 DAYS Performed at [facility], [address], [place], [place] [address]    Report Status PENDING  Incomplete  Urine culture     Status: None   Collection Time: 10/03/19 10:18 PM   Specimen: Urine, Random  Result Value Ref Range Status   Specimen Description   Final    URINE, RANDOM Performed at [facility], [address], [place], [place] [address]    Special Requests   Final    NONE Performed at [facility], [address], [place],  [address]    Culture   Final    NO GROWTH Performed at [facility], [place] [address], [place],   [address]    Report Status 10/05/2019 FINAL  Final  Resp Panel by RT PCR (RSV, Flu A&B, Covid) - Nasopharyngeal Swab     Status: None   Collection Time: 10/04/19 12:47 AM   Specimen: Nasopharyngeal Swab  Result Value Ref Range Status   SARS Coronavirus 2 by RT PCR NEGATIVE NEGATIVE Final    Comment: (NOTE) SARS-CoV-2 target nucleic acids are NOT DETECTED.  The SARS-CoV-2 RNA is generally detectable in upper respiratoy specimens during the acute phase of infection. The lowest concentration of SARS-CoV-2 viral copies this assay can detect is 131 copies/mL. A negative result does not preclude SARS-Cov-2 infection and should not be used as the sole basis for treatment or other patient management decisions. A negative result may occur with  improper specimen collection/handling, submission of specimen other than nasopharyngeal swab, presence of viral mutation(s) within the areas targeted by this assay, and inadequate number of viral copies (<131 copies/mL). A negative result must be combined with clinical observations, patient history, and epidemiological information. The expected result is Negative.  Fact Sheet for Patients:  PinkCheek.be  Fact Sheet for Healthcare Providers:  GravelBags.it  This test is no t yet approved or cleared by the [place] FDA and  has been authorized for detection and/or diagnosis of SARS-CoV-2 by FDA under an Emergency Use Authorization (EUA). This EUA will remain  in effect (meaning this test can be used) for the duration of the COVID-19 declaration under Section 564(b)(1) of the Act, 21 U.S.C. section 360bbb-3(b)(1), unless the authorization is terminated or revoked sooner.     Influenza A by PCR NEGATIVE NEGATIVE Final   Influenza B by PCR NEGATIVE NEGATIVE Final    Comment: (NOTE) The Xpert Xpress SARS-CoV-2/FLU/RSV assay is intended as an aid in  the diagnosis of influenza from  Nasopharyngeal swab specimens and  should not be used as a sole basis for treatment. Nasal washings and  aspirates are unacceptable for Xpert Xpress SARS-CoV-2/FLU/RSV  testing.  Fact Sheet for Patients: PinkCheek.be  Fact Sheet for Healthcare Providers: GravelBags.it  This test is not yet approved or cleared by the [place] FDA and  has been authorized for detection and/or diagnosis of SARS-CoV-2 by  FDA under an Emergency Use Authorization (EUA). This EUA will remain  in effect (meaning this test can be used) for the duration of the  Covid-19 declaration under Section 564(b)(1) of the Act, 21  U.S.C. section 360bbb-3(b)(1), unless the authorization is  terminated or revoked.    Respiratory Syncytial Virus by PCR NEGATIVE NEGATIVE Final    Comment: (NOTE) Fact Sheet for Patients: PinkCheek.be  Fact Sheet for Healthcare Providers: GravelBags.it  This test is not yet approved or cleared by the [place] FDA and  has been authorized for detection and/or diagnosis of SARS-CoV-2 by  FDA under an Emergency Use Authorization (EUA). This EUA will remain  in effect (meaning this test can be used) for the duration of the  COVID-19 declaration under Section 564(b)(1) of the Act, 21 U.S.C.  section 360bbb-3(b)(1), unless the authorization is terminated or  revoked. Performed at [facility], [address] [place], [place] [address]     Coagulation Studies: No results for input(s): LABPROT, INR in the last 72 hours.  Urinalysis: No results for input(s): COLORURINE, LABSPEC, PHURINE, GLUCOSEU, HGBUR, BILIRUBINUR, KETONESUR, PROTEINUR, UROBILINOGEN, NITRITE, LEUKOCYTESUR in the last 72 hours.  Invalid input(s): APPERANCEUR    Imaging: US RENAL  Result Date: 10/05/2019 CLINICAL DATA:  Chronic kidney disease stage 4 EXAM: RENAL / URINARY TRACT  ULTRASOUND COMPLETE COMPARISON:  None. FINDINGS: Right Kidney: Renal measurements: 9.5 x 6.0 x 5.1 cm = volume: 153 mL. Cortical thinning. Increased echotexture. No mass or hydronephrosis. Small calcifications, likely nonobstructing stones. Left Kidney: Renal measurements: 9.5 x 4.6 x 5.1 cm = volume: 116 mL. Cortical thinning with increased echotexture. No mass or hydronephrosis. Small calcifications, likely nonobstructing stones. Bladder: Appears normal for degree of bladder distention. Other: None. IMPRESSION: No acute findings.  No hydronephrosis. Cortical thinning and increased echotexture. Bilateral nonobstructing nephrolithiasis. Electronically Signed   By: Rolm Baptise M.D.   On: 10/05/2019 15:25     Medications:    . amLODipine  10 mg Oral Daily  . amoxicillin-clavulanate  1 tablet Oral Q12H  . aspirin  81 mg Oral Daily  . atorvastatin  40 mg Oral QPM  . carvedilol  25 mg Oral BID  . donepezil  10 mg Oral QHS  . doxycycline  100 mg Oral Q12H  . enoxaparin (LOVENOX) injection  30 mg Subcutaneous Q24H  . insulin aspart  0-15 Units Subcutaneous TID WC  . insulin aspart  0-5 Units Subcutaneous QHS  . insulin glargine  8 Units Subcutaneous QHS  . levothyroxine  50 mcg Oral Q0600  . polyethylene glycol  17 g Oral Daily   acetaminophen **OR** acetaminophen, bisacodyl, magnesium citrate, ondansetron **OR** ondansetron (ZOFRAN) IV, senna-docusate  Assessment/ Plan:  Ms. GILLIAN MEEUWSEN is a 74 y.o. black female Ms. BRANDYCE DIMARIO is a 74 y.o. black female with hypothyroidism, hypertension, hyperlipidemia, diabetes mellitus type II insulin dependent, who was admitted to [facility] on 10/03/2019 for Right hip pain [M25.551] Cellulitis of right leg [M84.132] Foot ulceration, right, with unspecified severity ([place]) [L97.519] Acute kidney injury superimposed on CKD ([place]) [N17.9, N18.9]  1. Acute renal failure with hyperkalemia on chronic kidney disease stage IV with proteinuria: baseline creatinine  of 2.31, GFR of 20 on 06/21/19.  Chronic kidney disease secondary to diabetic nephropathy Acute kidney injury most likely due to concurrent illness.  Renal ultrasound with no obstruction No IV contrast exposure.  - low potassium diet.  - holding quinapril.   2. Hypertension: with preserved systolic function. Home regimen of amlodipine, carvedilol, and quinapril.  Restarted amlodipine  3. Diabetes mellitus type II with chronic kidney disease: insulin dependent. Hemoglobin G4W of 1.0% Complicated by diabetic  foot ulcer and charcot neuroarthropathy.   4. Anemia of chronic kidney disease:    LOS: 2 Elyon Zoll 7/1/20212:14 PM

## 2019-10-08 LAB — CULTURE, BLOOD (ROUTINE X 2)
Culture: NO GROWTH
Culture: NO GROWTH
Special Requests: ADEQUATE

## 2019-10-08 LAB — GLUCOSE, CAPILLARY: Glucose-Capillary: 111 mg/dL — ABNORMAL HIGH (ref 70–99)

## 2019-10-10 ENCOUNTER — Inpatient Hospital Stay
Admission: EM | Admit: 2019-10-10 | Discharge: 2019-10-14 | DRG: 871 | Disposition: A | Discharge status: Skilled Nursing Facility | Payer: Medicare Other | Attending: Internal Medicine | Admitting: Internal Medicine

## 2019-10-10 ENCOUNTER — Other Ambulatory Visit: Payer: Self-pay

## 2019-10-10 ENCOUNTER — Encounter: Payer: Self-pay | Admitting: Emergency Medicine

## 2019-10-10 ENCOUNTER — Emergency Department: Payer: Medicare Other

## 2019-10-10 DIAGNOSIS — E11621 Type 2 diabetes mellitus with foot ulcer: Secondary | ICD-10-CM | POA: Diagnosis present

## 2019-10-10 DIAGNOSIS — Z794 Long term (current) use of insulin: Secondary | ICD-10-CM | POA: Diagnosis not present

## 2019-10-10 DIAGNOSIS — L97512 Non-pressure chronic ulcer of other part of right foot with fat layer exposed: Secondary | ICD-10-CM | POA: Diagnosis present

## 2019-10-10 DIAGNOSIS — E1140 Type 2 diabetes mellitus with diabetic neuropathy, unspecified: Secondary | ICD-10-CM | POA: Diagnosis present

## 2019-10-10 DIAGNOSIS — I130 Hypertensive heart and chronic kidney disease with heart failure and stage 1 through stage 4 chronic kidney disease, or unspecified chronic kidney disease: Secondary | ICD-10-CM | POA: Diagnosis present

## 2019-10-10 DIAGNOSIS — Z79899 Other long term (current) drug therapy: Secondary | ICD-10-CM | POA: Diagnosis not present

## 2019-10-10 DIAGNOSIS — Z20822 Contact with and (suspected) exposure to covid-19: Secondary | ICD-10-CM | POA: Diagnosis present

## 2019-10-10 DIAGNOSIS — E785 Hyperlipidemia, unspecified: Secondary | ICD-10-CM | POA: Diagnosis present

## 2019-10-10 DIAGNOSIS — F329 Major depressive disorder, single episode, unspecified: Secondary | ICD-10-CM | POA: Diagnosis present

## 2019-10-10 DIAGNOSIS — Z961 Presence of intraocular lens: Secondary | ICD-10-CM | POA: Diagnosis present

## 2019-10-10 DIAGNOSIS — Z9842 Cataract extraction status, left eye: Secondary | ICD-10-CM

## 2019-10-10 DIAGNOSIS — I5032 Chronic diastolic (congestive) heart failure: Secondary | ICD-10-CM | POA: Diagnosis present

## 2019-10-10 DIAGNOSIS — N184 Chronic kidney disease, stage 4 (severe): Secondary | ICD-10-CM | POA: Diagnosis present

## 2019-10-10 DIAGNOSIS — Z7982 Long term (current) use of aspirin: Secondary | ICD-10-CM | POA: Diagnosis not present

## 2019-10-10 DIAGNOSIS — J690 Pneumonitis due to inhalation of food and vomit: Secondary | ICD-10-CM | POA: Diagnosis present

## 2019-10-10 DIAGNOSIS — Z79890 Hormone replacement therapy (postmenopausal): Secondary | ICD-10-CM | POA: Diagnosis not present

## 2019-10-10 DIAGNOSIS — M79604 Pain in right leg: Secondary | ICD-10-CM | POA: Diagnosis not present

## 2019-10-10 DIAGNOSIS — A419 Sepsis, unspecified organism: Principal | ICD-10-CM | POA: Diagnosis present

## 2019-10-10 DIAGNOSIS — G9341 Metabolic encephalopathy: Secondary | ICD-10-CM | POA: Diagnosis present

## 2019-10-10 DIAGNOSIS — E039 Hypothyroidism, unspecified: Secondary | ICD-10-CM | POA: Diagnosis present

## 2019-10-10 DIAGNOSIS — E11610 Type 2 diabetes mellitus with diabetic neuropathic arthropathy: Secondary | ICD-10-CM | POA: Diagnosis present

## 2019-10-10 DIAGNOSIS — L97412 Non-pressure chronic ulcer of right heel and midfoot with fat layer exposed: Secondary | ICD-10-CM | POA: Diagnosis not present

## 2019-10-10 DIAGNOSIS — E1165 Type 2 diabetes mellitus with hyperglycemia: Secondary | ICD-10-CM | POA: Diagnosis present

## 2019-10-10 DIAGNOSIS — I10 Essential (primary) hypertension: Secondary | ICD-10-CM | POA: Diagnosis present

## 2019-10-10 DIAGNOSIS — E1122 Type 2 diabetes mellitus with diabetic chronic kidney disease: Secondary | ICD-10-CM | POA: Diagnosis present

## 2019-10-10 DIAGNOSIS — D649 Anemia, unspecified: Secondary | ICD-10-CM | POA: Diagnosis present

## 2019-10-10 LAB — CBC WITH DIFFERENTIAL/PLATELET
Abs Immature Granulocytes: 0.13 10*3/uL — ABNORMAL HIGH (ref 0.00–0.07)
Basophils Absolute: 0.1 10*3/uL (ref 0.0–0.1)
Basophils Relative: 1 %
Eosinophils Absolute: 0.2 10*3/uL (ref 0.0–0.5)
Eosinophils Relative: 1 %
HCT: 32.8 % — ABNORMAL LOW (ref 36.0–46.0)
Hemoglobin: 11.2 g/dL — ABNORMAL LOW (ref 12.0–15.0)
Immature Granulocytes: 1 %
Lymphocytes Relative: 16 %
Lymphs Abs: 2.1 10*3/uL (ref 0.7–4.0)
MCH: 29.9 pg (ref 26.0–34.0)
MCHC: 34.1 g/dL (ref 30.0–36.0)
MCV: 87.7 fL (ref 80.0–100.0)
Monocytes Absolute: 1.5 10*3/uL — ABNORMAL HIGH (ref 0.1–1.0)
Monocytes Relative: 12 %
Neutro Abs: 9.1 10*3/uL — ABNORMAL HIGH (ref 1.7–7.7)
Neutrophils Relative %: 69 %
Platelets: 249 10*3/uL (ref 150–400)
RBC: 3.74 MIL/uL — ABNORMAL LOW (ref 3.87–5.11)
RDW: 13.6 % (ref 11.5–15.5)
WBC: 13 10*3/uL — ABNORMAL HIGH (ref 4.0–10.5)
nRBC: 0 % (ref 0.0–0.2)

## 2019-10-10 LAB — PROTIME-INR
INR: 1.2 (ref 0.8–1.2)
Prothrombin Time: 14.6 seconds (ref 11.4–15.2)

## 2019-10-10 LAB — COMPREHENSIVE METABOLIC PANEL
ALT: 73 U/L — ABNORMAL HIGH (ref 0–44)
AST: 85 U/L — ABNORMAL HIGH (ref 15–41)
Albumin: 3 g/dL — ABNORMAL LOW (ref 3.5–5.0)
Alkaline Phosphatase: 95 U/L (ref 38–126)
Anion gap: 9 (ref 5–15)
BUN: 43 mg/dL — ABNORMAL HIGH (ref 8–23)
CO2: 18 mmol/L — ABNORMAL LOW (ref 22–32)
Calcium: 8.5 mg/dL — ABNORMAL LOW (ref 8.9–10.3)
Chloride: 111 mmol/L (ref 98–111)
Creatinine, Ser: 2.02 mg/dL — ABNORMAL HIGH (ref 0.44–1.00)
GFR calc Af Amer: 28 mL/min — ABNORMAL LOW (ref >60)
GFR calc non Af Amer: 24 mL/min — ABNORMAL LOW (ref >60)
Glucose, Bld: 203 mg/dL — ABNORMAL HIGH (ref 70–99)
Potassium: 4.9 mmol/L (ref 3.5–5.1)
Sodium: 138 mmol/L (ref 135–145)
Total Bilirubin: 0.7 mg/dL (ref 0.3–1.2)
Total Protein: 8.4 g/dL — ABNORMAL HIGH (ref 6.5–8.1)

## 2019-10-10 LAB — CK: Total CK: 110 U/L (ref 38–234)

## 2019-10-10 LAB — BRAIN NATRIURETIC PEPTIDE: B Natriuretic Peptide: 115.9 pg/mL — ABNORMAL HIGH (ref 0.0–100.0)

## 2019-10-10 LAB — APTT: aPTT: 31 seconds (ref 24–36)

## 2019-10-10 LAB — GLUCOSE, CAPILLARY
Glucose-Capillary: 239 mg/dL — ABNORMAL HIGH (ref 70–99)
Glucose-Capillary: 178 mg/dL — ABNORMAL HIGH (ref 70–99)

## 2019-10-10 LAB — SARS CORONAVIRUS 2 BY RT PCR (HOSPITAL ORDER, PERFORMED IN ~~LOC~~ HOSPITAL LAB): SARS Coronavirus 2: NEGATIVE

## 2019-10-10 LAB — PROCALCITONIN: Procalcitonin: 1.46 ng/mL

## 2019-10-10 LAB — LACTIC ACID, PLASMA: Lactic Acid, Venous: 1.5 mmol/L (ref 0.5–1.9)

## 2019-10-10 MED ORDER — HEPARIN SODIUM (PORCINE) 5000 UNIT/ML IJ SOLN
5000.0000 [IU] | Freq: Three times a day (TID) | INTRAMUSCULAR | Status: DC
  Administered 2019-10-10 – 2019-10-14 (×12): 5000 [IU] via SUBCUTANEOUS
  Filled 2019-10-10 (×13): qty 1

## 2019-10-10 MED ORDER — VANCOMYCIN HCL IN DEXTROSE 1-5 GM/200ML-% IV SOLN
1000.0000 mg | Freq: Once | INTRAVENOUS | Status: AC
  Administered 2019-10-10: 1000 mg via INTRAVENOUS
  Filled 2019-10-10: qty 200

## 2019-10-10 MED ORDER — VANCOMYCIN HCL 750 MG/150ML IV SOLN
750.0000 mg | INTRAVENOUS | Status: DC
  Filled 2019-10-10: qty 150

## 2019-10-10 MED ORDER — SODIUM CHLORIDE 0.9 % IV SOLN: INTRAVENOUS | Status: DC

## 2019-10-10 MED ORDER — ONDANSETRON HCL 4 MG PO TABS: 4.0000 mg | ORAL_TABLET | Freq: Four times a day (QID) | ORAL | Status: DC | PRN

## 2019-10-10 MED ORDER — ACETAMINOPHEN 325 MG PO TABS
650.0000 mg | ORAL_TABLET | Freq: Four times a day (QID) | ORAL | Status: DC | PRN
  Administered 2019-10-12 – 2019-10-13 (×3): 650 mg via ORAL
  Filled 2019-10-10 (×3): qty 2

## 2019-10-10 MED ORDER — INSULIN ASPART 100 UNIT/ML ~~LOC~~ SOLN
0.0000 [IU] | Freq: Three times a day (TID) | SUBCUTANEOUS | Status: DC
  Administered 2019-10-10: 3 [IU] via SUBCUTANEOUS
  Administered 2019-10-11 (×3): 1 [IU] via SUBCUTANEOUS
  Filled 2019-10-10 (×4): qty 1

## 2019-10-10 MED ORDER — INSULIN GLARGINE 100 UNIT/ML ~~LOC~~ SOLN
8.0000 [IU] | Freq: Every day | SUBCUTANEOUS | Status: DC
  Administered 2019-10-10 – 2019-10-13 (×4): 8 [IU] via SUBCUTANEOUS
  Filled 2019-10-10 (×5): qty 0.08

## 2019-10-10 MED ORDER — VANCOMYCIN HCL IN DEXTROSE 1-5 GM/200ML-% IV SOLN: 1000.0000 mg | Freq: Once | INTRAVENOUS | Status: DC

## 2019-10-10 MED ORDER — ONDANSETRON HCL 4 MG/2ML IJ SOLN
4.0000 mg | Freq: Four times a day (QID) | INTRAMUSCULAR | Status: DC | PRN
  Administered 2019-10-11: 13:00:00 4 mg via INTRAVENOUS
  Filled 2019-10-10: qty 2

## 2019-10-10 MED ORDER — SODIUM CHLORIDE 0.9 % IV SOLN: 2.0000 g | Freq: Once | INTRAVENOUS | Status: DC

## 2019-10-10 MED ORDER — ACETAMINOPHEN 500 MG PO TABS
1000.0000 mg | ORAL_TABLET | Freq: Once | ORAL | Status: AC
  Administered 2019-10-10: 1000 mg via ORAL
  Filled 2019-10-10: qty 2

## 2019-10-10 MED ORDER — SODIUM CHLORIDE 0.9 % IV SOLN
2.0000 g | Freq: Two times a day (BID) | INTRAVENOUS | Status: DC
  Administered 2019-10-10 – 2019-10-13 (×6): 2 g via INTRAVENOUS
  Filled 2019-10-10 (×7): qty 2

## 2019-10-10 MED ORDER — HYDROCODONE-ACETAMINOPHEN 5-325 MG PO TABS: 1.0000 | ORAL_TABLET | ORAL | Status: DC | PRN

## 2019-10-10 MED ORDER — METRONIDAZOLE IN NACL 5-0.79 MG/ML-% IV SOLN
500.0000 mg | Freq: Once | INTRAVENOUS | Status: AC
  Administered 2019-10-10: 500 mg via INTRAVENOUS
  Filled 2019-10-10: qty 100

## 2019-10-10 MED ORDER — SODIUM CHLORIDE 0.9 % IV BOLUS
1000.0000 mL | Freq: Once | INTRAVENOUS | Status: AC
  Administered 2019-10-10: 1000 mL via INTRAVENOUS

## 2019-10-10 MED ORDER — SODIUM CHLORIDE 0.9 % IV SOLN
2.0000 g | Freq: Once | INTRAVENOUS | Status: AC
  Administered 2019-10-10: 2 g via INTRAVENOUS
  Filled 2019-10-10: qty 2

## 2019-10-10 MED ORDER — BISACODYL 5 MG PO TBEC: 5.0000 mg | DELAYED_RELEASE_TABLET | Freq: Every day | ORAL | Status: DC | PRN

## 2019-10-10 MED ORDER — ACETAMINOPHEN 650 MG RE SUPP: 650.0000 mg | Freq: Four times a day (QID) | RECTAL | Status: DC | PRN

## 2019-10-10 MED ORDER — POLYETHYLENE GLYCOL 3350 17 G PO PACK: 17.0000 g | PACK | Freq: Every day | ORAL | Status: DC | PRN

## 2019-10-10 NOTE — Consult Note (Signed)
Pharmacy Antibiotic Note  [name] [name] is a 74 y.o. female admitted on 10/10/2019 with pneumonia.  Pharmacy has been consulted for Cefepime and vancomycin dosing.  Plan: Vancomycin 2g IV loading dose ordered in ED. Will start vancomycin 750mg  IV every 24 hours based on [place] Vancomycin Dosing protocol.   Start Cefepime 2g IV every 12 hours.   Height: 5\' 7"  (170.2 cm) Weight: 97.1 kg (214 lb 1.1 oz) IBW/kg (Calculated) : 61.6  Temp (24hrs), Avg:100.9 F (38.3 C), Min:100.9 F (38.3 C), Max:100.9 F (38.3 C)  Recent Labs  Lab 10/03/19 1649 10/03/19 1720 10/03/19 2013 10/04/19 0503 10/05/19 0313 10/06/19 0443 10/07/19 0403 10/10/19 1211  WBC 21.9*  --   --  19.0* 14.9*  --  12.0* 13.0*  CREATININE  --    < >  --  2.53* 2.87* 2.54* 2.27* 2.02*  LATICACIDVEN  --   --  1.1  --   --   --   --  1.5   < > = values in this interval not displayed.    Estimated Creatinine Clearance: 29.7 mL/min (A) (by C-G formula based on SCr of 2.02 mg/dL (H)).    No Known Allergies  Antimicrobials this admission: 7/4 Cefepime >>  7/4 Vancomcyin >>   Microbiology results: 7/4 BCx: pending  7/4 UCx: pending  Thank you for allowing pharmacy to be a part of this patient's care.  Pernell Dupre, PharmD, BCPS Clinical Pharmacist 10/10/2019 3:10 PM

## 2019-10-10 NOTE — Progress Notes (Signed)
PHARMACY -  BRIEF ANTIBIOTIC NOTE   Pharmacy has received consult(s) for Cefepime and Vancomycin  from an ED provider.  The patient's profile has been reviewed for ht/wt/allergies/indication/available labs.    One time order(s) placed for Vancomycin 1000mg  IV x 2 (Total loading dose of 2000mg ) and Cefepime 2g   Further antibiotics/pharmacy consults should be ordered by admitting physician if indicated.                       Thank you, Pernell Dupre, PharmD, BCPS Clinical Pharmacist 10/10/2019 12:58 PM

## 2019-10-10 NOTE — ED Notes (Addendum)
Upon arrival pt incontinent of stool and urine. Pt changed and fresh linen given and purewick placed. Pt's pants thrown in garbage due to being covered in a very large amount of stool and urine.

## 2019-10-10 NOTE — ED Triage Notes (Signed)
Pt to ED by EMS from home where pt was found lying in the same position since Thurs per family. Pt found incontinent of stool and urine. Pt recently treated for right foot wound. Pt presents with weakness and states inability to ambulate.

## 2019-10-10 NOTE — ED Provider Notes (Signed)
[facility] Emergency Department Provider Note  ____________________________________________   First MD Initiated Contact with Patient 10/10/19 1144     (approximate)  I have reviewed the triage vital signs and the nursing notes.   HISTORY  Chief Complaint Weakness    HPI [name] [name] is a 74 y.o. female with CHF, diabetes, hypertension, hyperlipidemia who comes in with multiple concerns.  Patient's been living at home but unable to get out of bed since Thursday.  She has been laying in her own feces.  Noted to be febrile. She denies any concern just states she does not want to be there.  Immobility severe, constant, since Thursday, reports some pain in her right hip perhaps leading her to not want to get up, nothing makes It better.  On review of records patient was discharged on 7/1.  At that time she had had a CT of her right hip that was negative.  They were treating her for a diabetic foot ulcer.          Past Medical History:  Diagnosis Date  . CHF (congestive heart failure) ([place])   . Diabetes mellitus without complication ([place])   . Humerus fracture 04/20/2018   left  . Hyperlipidemia   . Hypertension   . Hypothyroidism   . Vertigo    several yrs ago  . Wears dentures    partial upper and lower (loose)    Patient Active Problem List   Diagnosis Date Noted  . Hyperkalemia   . Rib pain on left side   . Leukocytosis 10/04/2019  . Fall at home, initial encounter 10/04/2019  . Cellulitis of right leg 10/04/2019  . CKD stage 4 due to type 2 diabetes mellitus ([place]) 10/04/2019  . Acute kidney injury superimposed on CKD ([employer])   . RUQ pain   . Hyperlipidemia   . Right hip pain 10/03/2019  . Diabetic ulcer of right foot associated with type 2 diabetes mellitus, with fat layer exposed ([place]) 10/03/2019  . Cellulitis of right foot 10/03/2019  . HTN (hypertension) 10/03/2019  . Type 2 diabetes mellitus with other specified complication ([place])  73/71/0626  . Hypothyroidism (acquired) 10/03/2019  . UTI (urinary tract infection) 10/03/2019  . Sepsis ([place]) 04/25/2018    Past Surgical History:  Procedure Laterality Date  . CATARACT EXTRACTION W/PHACO Left 12/01/2018   Procedure: CATARACT EXTRACTION PHACO AND INTRAOCULAR LENS PLACEMENT ([place]) LEFT DIABETIC;  Surgeon: Birder Robson, MD;  Location: [place];  Service: Ophthalmology;  Laterality: Left;  Diabetic - insulin  . COLONOSCOPY WITH PROPOFOL N/A 07/07/2019   Procedure: COLONOSCOPY WITH PROPOFOL;  Surgeon: Robert Bellow, MD;  Location: [facility] ENDOSCOPY;  Service: Endoscopy;  Laterality: N/A;  . ESOPHAGOGASTRODUODENOSCOPY (EGD) WITH PROPOFOL N/A 07/07/2019   Procedure: ESOPHAGOGASTRODUODENOSCOPY (EGD) WITH PROPOFOL;  Surgeon: Robert Bellow, MD;  Location: [facility] ENDOSCOPY;  Service: Endoscopy;  Laterality: N/A;  . FOOT SURGERY Right    lesion excision    Prior to Admission medications   Medication Sig Start Date End Date Taking? Authorizing Provider  amLODipine (NORVASC) 10 MG tablet Take 1 tablet by mouth daily. 05/02/16   [provider]  amoxicillin-clavulanate (AUGMENTIN) 500-125 MG tablet Take 1 tablet (500 mg total) by mouth every 12 (twelve) hours for 7 days. 10/07/19 10/14/19  Nicole Kindred A, DO  ASPIRIN 81 PO Take by mouth daily.    [provider]  atorvastatin (LIPITOR) 40 MG tablet Take 1 tablet by mouth daily. 05/02/16   [provider]  carvedilol (COREG) 25 MG tablet Take 1 tablet by mouth 2 (two) times daily. 05/02/16   [provider]  donepezil (ARICEPT) 10 MG tablet Take 10 mg by mouth daily. 04/21/18   [provider]  doxycycline (VIBRA-TABS) 100 MG tablet Take 1 tablet (100 mg total) by mouth every 12 (twelve) hours for 7 days. 10/07/19 10/14/19  Nicole Kindred A, DO  insulin aspart protamine- aspart (NOVOLOG MIX 70/30) (70-30) 100 UNIT/ML injection Inject 15 Units into the skin daily with breakfast.     [provider]  SYNTHROID 50 MCG tablet Take 1 tablet by mouth daily. 04/21/18   [provider]  TRESIBA FLEXTOUCH 100 UNIT/ML SOPN FlexTouch Pen Inject 80 Units into the skin at bedtime.  04/21/18   [provider]    Allergies Patient has no known allergies.  Family History  Problem Relation Age of Onset  . Breast cancer Neg Hx     Social History Social History   Tobacco Use  . Smoking status: Never Smoker  . Smokeless tobacco: Never Used  Vaping Use  . Vaping Use: Never used  Substance Use Topics  . Alcohol use: No  . Drug use: Never      Review of Systems Constitutional: +fever Eyes: No visual changes. ENT: No sore throat. Cardiovascular: Denies chest pain. Respiratory: Denies shortness of breath. Gastrointestinal: No abdominal pain.  No nausea, no vomiting.  No diarrhea.  No constipation. Genitourinary: Negative for dysuria. Musculoskeletal: Negative for back pain. R hip pain Skin: Negative for rash. Neurological: Negative for headaches, focal weakness or numbness. All other ROS negative ____________________________________________   PHYSICAL EXAM:  VITAL SIGNS: Blood pressure (!) 160/73, pulse 69, temperature 98.7 F (37.1 C), temperature source Oral, resp. rate 14, height 5\' 7"  (1.702 m), weight 97.1 kg, SpO2 95 %.  Constitutional: Alert and oriented.disheveled  Eyes: Conjunctivae are normal. EOMI. Head: Atraumatic. Nose: No congestion/rhinnorhea. Mouth/Throat: Mucous membranes are moist.   Neck: No stridor. Trachea Midline. FROM Cardiovascular: Normal rate, regular rhythm. Grossly normal heart sounds.  Good peripheral circulation. Respiratory: Normal respiratory effort.  No retractions. Lungs CTAB. Gastrointestinal: Soft and nontender. No distention. No abdominal bruits.  Musculoskeletal: No lower extremity tenderness nor edema.  No joint effusions. Neurologic:  Normal speech and language. No gross focal neurologic deficits  are appreciated. Able to wiggle both toes. Not able to lift the right leg as well off the bed as the left leg. Skin:  Skin is warm, dry and intact. Ulcer on button of right foot. No signs of infection Psychiatric: Mood and affect are normal. Speech and behavior are normal. GU: Deferred   ____________________________________________   LABS (all labs ordered are listed, but only abnormal results are displayed)  Labs Reviewed  COMPREHENSIVE METABOLIC PANEL - Abnormal; Notable for the following components:      Result Value   CO2 18 (*)    Glucose, Bld 203 (*)    BUN 43 (*)    Creatinine, Ser 2.02 (*)    Calcium 8.5 (*)    Total Protein 8.4 (*)    Albumin 3.0 (*)    AST 85 (*)    ALT 73 (*)    GFR calc non Af Amer 24 (*)    GFR calc Af Amer 28 (*)    All other components within normal limits  CBC WITH DIFFERENTIAL/PLATELET - Abnormal; Notable for the following components:   WBC 13.0 (*)    RBC 3.74 (*)    Hemoglobin  11.2 (*)    HCT 32.8 (*)    Neutro Abs 9.1 (*)    Monocytes Absolute 1.5 (*)    Abs Immature Granulocytes 0.13 (*)    All other components within normal limits  BRAIN NATRIURETIC PEPTIDE - Abnormal; Notable for the following components:   B Natriuretic Peptide 115.9 (*)    All other components within normal limits  CBC - Abnormal; Notable for the following components:   WBC 11.4 (*)    RBC 3.37 (*)    Hemoglobin 10.1 (*)    HCT 30.7 (*)    All other components within normal limits  BASIC METABOLIC PANEL - Abnormal; Notable for the following components:   Chloride 118 (*)    CO2 17 (*)    Glucose, Bld 137 (*)    BUN 42 (*)    Creatinine, Ser 1.88 (*)    Calcium 8.4 (*)    GFR calc non Af Amer 26 (*)    GFR calc Af Amer 30 (*)    All other components within normal limits  GLUCOSE, CAPILLARY - Abnormal; Notable for the following components:   Glucose-Capillary 239 (*)    All other components within normal limits  GLUCOSE, CAPILLARY - Abnormal; Notable for  the following components:   Glucose-Capillary 178 (*)    All other components within normal limits  GLUCOSE, CAPILLARY - Abnormal; Notable for the following components:   Glucose-Capillary 143 (*)    All other components within normal limits  CULTURE, BLOOD (ROUTINE X 2)  CULTURE, BLOOD (ROUTINE X 2)  SARS CORONAVIRUS 2 BY RT PCR (HOSPITAL ORDER, [facility])  URINE CULTURE  EXPECTORATED SPUTUM ASSESSMENT W REFEX TO RESP CULTURE  MRSA PCR SCREENING  LACTIC ACID, PLASMA  APTT  PROTIME-INR  PROCALCITONIN  CK  PROCALCITONIN  PROTIME-INR  URINALYSIS, ROUTINE W REFLEX MICROSCOPIC  CORTISOL-AM, BLOOD   ____________________________________________   ED ECG REPORT I, [name] [place], the attending physician, personally viewed and interpreted this ECG.  Normal sinus rate of 76, no st elevation, no twi, normal intervals, occasional PVCs. ____________________________________________  RADIOLOGY   Official radiology report(s): CT ABDOMEN PELVIS WO CONTRAST  Result Date: 10/10/2019 CLINICAL DATA:  Found down, weakness, right foot wound, recent falls EXAM: CT CHEST, ABDOMEN AND PELVIS WITHOUT CONTRAST TECHNIQUE: Multidetector CT imaging of the chest, abdomen and pelvis was performed following the standard protocol without IV contrast. COMPARISON:  CT pelvis, 10/03/2019 FINDINGS: CT CHEST FINDINGS Cardiovascular: Aortic atherosclerosis. Normal heart size. Three-vessel coronary artery calcifications no pericardial effusion. Mediastinum/Nodes: No enlarged mediastinal, hilar, or axillary lymph nodes. Thyroid gland, trachea, and esophagus demonstrate no significant findings. Lungs/Pleura: There is dependent heterogeneous and bandlike consolidation of the left greater than right lungs, most conspicuous in the left lung base (series 5, image 93). No pleural effusion or pneumothorax. Musculoskeletal: No chest wall mass or suspicious bone lesions identified. CT ABDOMEN PELVIS  FINDINGS Hepatobiliary: No solid liver abnormality is seen. No gallstones, gallbladder wall thickening, or biliary dilatation. Pancreas: Unremarkable. No pancreatic ductal dilatation or surrounding inflammatory changes. Spleen: Normal in size without significant abnormality. Adrenals/Urinary Tract: Adrenal glands are unremarkable. Numerous bilateral renal calculi and/or vascular calcifications. No hydronephrosis. Bladder is unremarkable. Stomach/Bowel: Stomach is within normal limits. Appendix appears normal. No evidence of bowel wall thickening, distention, or inflammatory changes. Vascular/Lymphatic: Aortic atherosclerosis. No enlarged abdominal or pelvic lymph nodes. Reproductive: No mass or other abnormality. Other: No abdominal wall hernia or abnormality. No abdominopelvic ascites. Musculoskeletal: No acute or  significant osseous findings. IMPRESSION: 1. There is dependent heterogeneous and bandlike consolidation of the left greater than right lungs, most conspicuous in the left lung base. Findings are consistent with multifocal infection or aspiration. 2. No noncontrast CT evidence of acute traumatic injury to the abdomen or pelvis. 3. Numerous bilateral renal calculi and/or vascular calcifications. No hydronephrosis. 4. Coronary artery disease.  Aortic Atherosclerosis (ICD10-I70.0). Electronically Signed   By: Eddie Candle M.D.   On: 10/10/2019 14:06   CT Head Wo Contrast  Result Date: 10/10/2019 CLINICAL DATA:  Chronic trauma. Head/C-spine injury suspected. Patient was found down since Thursday. EXAM: CT HEAD WITHOUT CONTRAST CT CERVICAL SPINE WITHOUT CONTRAST TECHNIQUE: Multidetector CT imaging of the head and cervical spine was performed following the standard protocol without intravenous contrast. Multiplanar CT image reconstructions of the cervical spine were also generated. COMPARISON:  04/20/2018 FINDINGS: CT HEAD FINDINGS Brain: There is central and cortical atrophy. Periventricular white matter  changes are consistent with small vessel disease. There is no intra or extra-axial fluid collection or mass lesion. The basilar cisterns and ventricles have a normal appearance. There is no CT evidence for acute infarction or hemorrhage. Vascular: There is atherosclerotic calcification of the internal carotid arteries. No hyperdense vessels. Skull: Normal. Negative for fracture or focal lesion. Sinuses/Orbits: There is mucosal thickening of the maxillary sinuses bilaterally. No evidence for acute sinus fracture. Mastoid air cells are normally aerated. Other: None. CT CERVICAL SPINE FINDINGS Alignment: Normal. Skull base and vertebrae: No acute fracture. No primary bone lesion or focal pathologic process. Soft tissues and spinal canal: No prevertebral fluid or swelling. No visible canal hematoma. Disc levels: Mild disc height loss and uncovertebral spurring at C5-6. Upper chest: Unremarkable. Other: Atherosclerosis of the vertebral arteries. IMPRESSION: 1. No evidence for acute intracranial abnormality. 2. Atrophy and small vessel disease. 3. Chronic sinusitis. 4. No evidence for acute cervical spine abnormality. 5. Mild degenerative changes in the mid cervical spine. Electronically Signed   By: Nolon Nations M.D.   On: 10/10/2019 14:04   CT Chest Wo Contrast  Result Date: 10/10/2019 CLINICAL DATA:  Found down, weakness, right foot wound, recent falls EXAM: CT CHEST, ABDOMEN AND PELVIS WITHOUT CONTRAST TECHNIQUE: Multidetector CT imaging of the chest, abdomen and pelvis was performed following the standard protocol without IV contrast. COMPARISON:  CT pelvis, 10/03/2019 FINDINGS: CT CHEST FINDINGS Cardiovascular: Aortic atherosclerosis. Normal heart size. Three-vessel coronary artery calcifications no pericardial effusion. Mediastinum/Nodes: No enlarged mediastinal, hilar, or axillary lymph nodes. Thyroid gland, trachea, and esophagus demonstrate no significant findings. Lungs/Pleura: There is dependent  heterogeneous and bandlike consolidation of the left greater than right lungs, most conspicuous in the left lung base (series 5, image 93). No pleural effusion or pneumothorax. Musculoskeletal: No chest wall mass or suspicious bone lesions identified. CT ABDOMEN PELVIS FINDINGS Hepatobiliary: No solid liver abnormality is seen. No gallstones, gallbladder wall thickening, or biliary dilatation. Pancreas: Unremarkable. No pancreatic ductal dilatation or surrounding inflammatory changes. Spleen: Normal in size without significant abnormality. Adrenals/Urinary Tract: Adrenal glands are unremarkable. Numerous bilateral renal calculi and/or vascular calcifications. No hydronephrosis. Bladder is unremarkable. Stomach/Bowel: Stomach is within normal limits. Appendix appears normal. No evidence of bowel wall thickening, distention, or inflammatory changes. Vascular/Lymphatic: Aortic atherosclerosis. No enlarged abdominal or pelvic lymph nodes. Reproductive: No mass or other abnormality. Other: No abdominal wall hernia or abnormality. No abdominopelvic ascites. Musculoskeletal: No acute or significant osseous findings. IMPRESSION: 1. There is dependent heterogeneous and bandlike consolidation of the left greater than right lungs, most  conspicuous in the left lung base. Findings are consistent with multifocal infection or aspiration. 2. No noncontrast CT evidence of acute traumatic injury to the abdomen or pelvis. 3. Numerous bilateral renal calculi and/or vascular calcifications. No hydronephrosis. 4. Coronary artery disease.  Aortic Atherosclerosis (ICD10-I70.0). Electronically Signed   By: Eddie Candle M.D.   On: 10/10/2019 14:06   CT Cervical Spine Wo Contrast  Result Date: 10/10/2019 CLINICAL DATA:  Chronic trauma. Head/C-spine injury suspected. Patient was found down since Thursday. EXAM: CT HEAD WITHOUT CONTRAST CT CERVICAL SPINE WITHOUT CONTRAST TECHNIQUE: Multidetector CT imaging of the head and cervical spine was  performed following the standard protocol without intravenous contrast. Multiplanar CT image reconstructions of the cervical spine were also generated. COMPARISON:  04/20/2018 FINDINGS: CT HEAD FINDINGS Brain: There is central and cortical atrophy. Periventricular white matter changes are consistent with small vessel disease. There is no intra or extra-axial fluid collection or mass lesion. The basilar cisterns and ventricles have a normal appearance. There is no CT evidence for acute infarction or hemorrhage. Vascular: There is atherosclerotic calcification of the internal carotid arteries. No hyperdense vessels. Skull: Normal. Negative for fracture or focal lesion. Sinuses/Orbits: There is mucosal thickening of the maxillary sinuses bilaterally. No evidence for acute sinus fracture. Mastoid air cells are normally aerated. Other: None. CT CERVICAL SPINE FINDINGS Alignment: Normal. Skull base and vertebrae: No acute fracture. No primary bone lesion or focal pathologic process. Soft tissues and spinal canal: No prevertebral fluid or swelling. No visible canal hematoma. Disc levels: Mild disc height loss and uncovertebral spurring at C5-6. Upper chest: Unremarkable. Other: Atherosclerosis of the vertebral arteries. IMPRESSION: 1. No evidence for acute intracranial abnormality. 2. Atrophy and small vessel disease. 3. Chronic sinusitis. 4. No evidence for acute cervical spine abnormality. 5. Mild degenerative changes in the mid cervical spine. Electronically Signed   By: Nolon Nations M.D.   On: 10/10/2019 14:04   DG Chest Port 1 View  Result Date: 10/10/2019 CLINICAL DATA:  Shortness of breath EXAM: PORTABLE CHEST 1 VIEW COMPARISON:  10/05/2019 chest x-ray 10/03/2019 FINDINGS: Lung volumes slightly diminished. Heart size accentuated by low lung volumes. Mildly globular appearance of cardiac silhouette. Hilar structures grossly normal. Developing airspace disease in the LEFT chest with linear features in the mid  chest and subtle retrocardiac densities. No acute skeletal process on limited assessment. IMPRESSION: 1. Developing airspace disease in the LEFT chest, platelike appearance favors atelectasis. Difficult to exclude developing infection at the LEFT lung base. 2. Mildly globular appearance of the cardiac silhouette, could be seen in the setting of developing pericardial effusion. Electronically Signed   By: Zetta Bills M.D.   On: 10/10/2019 12:38    ____________________________________________   PROCEDURES  Procedure(s) performed (including Critical Care):  .Critical Care Performed by: [name] Fairview, MD Authorized by: [name] Ridgeway, MD   Critical care provider statement:    Critical care time (minutes):  45   Critical care was necessary to treat or prevent imminent or life-threatening deterioration of the following conditions:  Sepsis   Critical care was time spent personally by me on the following activities:  Discussions with consultants, evaluation of patient's response to treatment, examination of patient, ordering and performing treatments and interventions, ordering and review of laboratory studies, ordering and review of radiographic studies, pulse oximetry, re-evaluation of patient's condition, obtaining history from patient or surrogate and review of old charts     ____________________________________________   INITIAL IMPRESSION / ASSESSMENT AND PLAN / ED COURSE  FORREST DEMURO was evaluated in Emergency Department on 10/10/2019 for the symptoms described in the history of present illness. She was evaluated in the context of the global COVID-19 pandemic, which necessitated consideration that the patient might be at risk for infection with the SARS-CoV-2 virus that causes COVID-19. Institutional protocols and algorithms that pertain to the evaluation of patients at risk for COVID-19 are in a state of rapid change based on information released by regulatory bodies including the CDC  and federal and state organizations. These policies and algorithms were followed during the patient's care in the ED.     Pt comes in unable to get out of bed for multiple days and now febrile with elevated white count. Sepsis alert called started on broad antibiotics. Holding on full fluid due to not wanting to over fluid resusc. Given not hypotensive or tachycardia. Starting broad spectrum antibiotics. Xray to evaluate for PNA, UA to evaluate for UTI.  Pt is alert but denying all symptoms and not a great historian. Xray no obvious cause. Will get CT scans to make sure no other cause of fever such as infected stone, diverticulitis, pna. CTA head in case she did have a fall to rule out ICH.  CT scans concerning for aspiration PNA.  Pt vitals remain stable with antibiotics and fluid.  Will d/w hospital for admission.       ____________________________________________   FINAL CLINICAL IMPRESSION(S) / ED DIAGNOSES   Final diagnoses:  Sepsis, due to unspecified organism, unspecified whether acute organ dysfunction present ([place])  Aspiration pneumonia, unspecified aspiration pneumonia type, unspecified laterality, unspecified part of lung ([place])      MEDICATIONS GIVEN DURING THIS VISIT:  Medications  heparin injection 5,000 Units (5,000 Units Subcutaneous Given 10/11/19 0626)  0.9 %  sodium chloride infusion ( Intravenous Rate/Dose Verify 10/11/19 0600)  acetaminophen (TYLENOL) tablet 650 mg (has no administration in time range)    Or  acetaminophen (TYLENOL) suppository 650 mg (has no administration in time range)  HYDROcodone-acetaminophen (NORCO/VICODIN) 5-325 MG per tablet 1-2 tablet (has no administration in time range)  polyethylene glycol (MIRALAX / GLYCOLAX) packet 17 g (has no administration in time range)  bisacodyl (DULCOLAX) EC tablet 5 mg (has no administration in time range)  ondansetron (ZOFRAN) tablet 4 mg (has no administration in time range)    Or  ondansetron (ZOFRAN)  injection 4 mg (has no administration in time range)  ceFEPIme (MAXIPIME) 2 g in sodium chloride 0.9 % 100 mL IVPB (2 g Intravenous New Bag/Given 10/11/19 0857)  insulin glargine (LANTUS) injection 8 Units (8 Units Subcutaneous Given 10/10/19 2225)  insulin aspart (novoLOG) injection 0-9 Units (1 Units Subcutaneous Given 10/11/19 0854)  vancomycin (VANCOCIN) IVPB 1000 mg/200 mL premix (has no administration in time range)  acetaminophen (TYLENOL) tablet 1,000 mg (1,000 mg Oral Given 10/10/19 1309)  sodium chloride 0.9 % bolus 1,000 mL (0 mLs Intravenous Stopping Infusion hung by another clincian 10/10/19 1359)  ceFEPIme (MAXIPIME) 2 g in sodium chloride 0.9 % 100 mL IVPB (0 g Intravenous Stopped 10/10/19 1326)  metroNIDAZOLE (FLAGYL) IVPB 500 mg (0 mg Intravenous Stopped 10/10/19 1443)  vancomycin (VANCOCIN) IVPB 1000 mg/200 mL premix (0 mg Intravenous Stopped 10/10/19 1518)  vancomycin (VANCOCIN) IVPB 1000 mg/200 mL premix (0 mg Intravenous Stopped 10/10/19 1709)     ED Discharge Orders    None       Note:  This document was prepared using Dragon voice recognition software and may include unintentional dictation errors.  [name] Isabel, MD 10/11/19 1047

## 2019-10-10 NOTE — Progress Notes (Signed)
CODE SEPSIS - PHARMACY COMMUNICATION  **Broad Spectrum Antibiotics should be administered within 1 hour of Sepsis diagnosis**  Time Code Sepsis Called/Page Received: @ 1246  Antibiotics Ordered: Cefepime, Flagyl, Vancomycin   Time of 1st antibiotic administration: @ 1258  Additional action taken by pharmacy: N/A  If necessary, Name of Provider/Nurse Contacted: N/A  Pernell Dupre, PharmD, BCPS Clinical Pharmacist 10/10/2019 1:04 PM

## 2019-10-10 NOTE — H&P (Addendum)
History and Physical    [name] [name] VEH:[id number] DOB: Nov 19, 1945 DOA: 10/10/2019  PCP: Marguerita Merles, MD  Patient coming from: home  I have personally briefly reviewed patient's old medical records in [place]  Chief Complaint: fever, cough  HPI: [name] [name] is a 74 y.o. 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  Patient was discharged from this hospital on Thursday, after admission for infection of her foot ulcer.  Therapy evaluated patient, recommended SNF, however patient and her son declined and chose instead to go home with home health services and a wheelchair.  Son stated that he and other family would be able to provide assistance for the patient.  When seen in the ED on admission, patient reports son's been doing his best.  She has been unable to get up out of bed, but says she has tried.  Has had very little to eat or drink.  Does endorse that she has gotten choked with swallowing.  Reports fevers and chills, productive cough and shortness of breath but currently denies chest pain.  Denies abdominal complaints including nausea vomiting or diarrhea.  Denies dysuria or urinary frequency.  Does report that her right leg hurts specifically at the knee, says that she slipped out of bed but unclear exactly when this occurred.  No other acute complaints.  Further history was obtained by social worker from patient's home health agency, as they saw her earlier today at son's request.  She was too weak to get up steps into the home after discharge on 7/1, had a fall, EMS had to be called to get her inside.  Her son has apparently been trying his best to care for her, checking blood sugar, giving meds,  repositioning, but has not been able to assist her with mobility on his own.  He reported that patient had been having some hallucinations.  He had been asking to bring her back to the hospital, but patient was refusing.  He contact [facility] to come early today to assess the patient, and EMS was called for transport to ED.     ED Course: Febrile 100.9, heart rate 75, respirations 22, BP 156/83, O2 sat 92% on room air.  Labs notable for white count of 13, hemoglobin 11.2 (up from 9.4 on 7/1), mildly elevated AST and ALT, BUN 43 and creatinine 2.02 which are improved from discharge 2 days ago, BNP mildly elevated 115, CK normal at 110, lactic acid 1.5, procalcitonin 1.46.  Chest x-ray showed left base airspace disease thought to be atelectasis versus infiltrate and globular cardiac silhouette concerning for possibly developing pericardial effusion.  CT of chest abdomen and pelvis without contrast showed no pericardial effusion but did show consolidation in both lung bases worse on the left than right, no pleural effusions.  No acute abdominal or pelvic findings.  CT of cervical spine also negative for acute [place].  Patient was treated with broad-spectrum antibiotics and fluid resuscitated in the ED.  She is admitted to hospitalist service for further management of sepsis secondary to multifocal versus aspiration pneumonia.  Review of Systems: As per HPI otherwise 10 point review of systems negative.    Past Medical History:  Diagnosis Date  . CHF (congestive heart  failure) ([place])   . Diabetes mellitus without complication ([place])   . Humerus fracture 04/20/2018   left  . Hyperlipidemia   . Hypertension   . Hypothyroidism   . Vertigo    several yrs ago  . Wears dentures    partial upper and lower (loose)    Past Surgical History:  Procedure Laterality Date  . CATARACT EXTRACTION W/PHACO Left 12/01/2018   Procedure: CATARACT EXTRACTION PHACO AND INTRAOCULAR LENS PLACEMENT ([place]) LEFT DIABETIC;  Surgeon:  Birder Robson, MD;  Location: [facility];  Service: Ophthalmology;  Laterality: Left;  Diabetic - insulin  . COLONOSCOPY WITH PROPOFOL N/A 07/07/2019   Procedure: COLONOSCOPY WITH PROPOFOL;  Surgeon: Robert Bellow, MD;  Location: [facility] END[facility]COPY;  Service: Endoscopy;  Laterality: N/A;  . ESOPHAGOGASTRODUODENOSCOPY (EGD) WITH PROPOFOL N/A 07/07/2019   Procedure: ESOPHAGOGASTRODUODENOSCOPY (EGD) WITH PROPOFOL;  Surgeon: Robert Bellow, MD;  Location: [facility] ENDOSCOPY;  Service: Endoscopy;  Laterality: N/A;  . FOOT SURGERY Right    lesion excision     reports that she has never smoked. She has never used smokeless tobacco. She reports that she does not drink alcohol and does not use drugs.  No Known Allergies  Family History  Problem Relation Age of Onset  . Breast cancer Neg Hx       Prior to Admission medications   Medication Sig Start Date End Date Taking? Authorizing Provider  amLODipine (NORVASC) 10 MG tablet Take 1 tablet by mouth daily. 05/02/16   [provider]  amoxicillin-clavulanate (AUGMENTIN) 500-125 MG tablet Take 1 tablet (500 mg total) by mouth every 12 (twelve) hours for 7 days. 10/07/19 10/14/19  Nicole Kindred A, DO  ASPIRIN 81 PO Take by mouth daily.    [provider]  atorvastatin (LIPITOR) 40 MG tablet Take 1 tablet by mouth daily. 05/02/16   [provider]  carvedilol (COREG) 25 MG tablet Take 1 tablet by mouth 2 (two) times daily. 05/02/16   [provider]  donepezil (ARICEPT) 10 MG tablet Take 10 mg by mouth daily. 04/21/18   [provider]  doxycycline (VIBRA-TABS) 100 MG tablet Take 1 tablet (100 mg total) by mouth every 12 (twelve) hours for 7 days. 10/07/19 10/14/19  Nicole Kindred A, DO  insulin aspart protamine- aspart (NOVOLOG MIX 70/30) (70-30) 100 UNIT/ML injection Inject 15 Units into the skin daily with breakfast.    [provider]  SYNTHROID 50 MCG tablet Take 1 tablet by mouth daily.  04/21/18   [provider]  TRESIBA FLEXTOUCH 100 UNIT/ML SOPN FlexTouch Pen Inject 80 Units into the skin at bedtime.  04/21/18   [provider]    Physical Exam: Vitals:   10/10/19 1315 10/10/19 1400 10/10/19 1500 10/10/19 1519  BP: (!) 158/71 (!) 164/75 (!) 151/83   Pulse: 83 79 80 85  Resp: 19 (!) 35 15 17  Temp:    98.3 F (36.8 C)  TempSrc:    Oral  SpO2: 92% 94% 93% 93%  Weight:      Height:        Constitutional: NAD, calm, comfortable, obese Eyes: EOMI, lids and conjunctivae normal ENMT: Mucous membranes are moist.  Hearing grossly normal. Respiratory: Rhonchi worst at the left base, no crackles. Normal respiratory effort. No accessory muscle use.  Cardiovascular: RRR, no murmurs / rubs / gallops. No extremity edema. 2+ pedal pulses.  Abdomen: soft, NT, ND, no masses or HSM palpated. +Bowel sounds.  Musculoskeletal: no clubbing / cyanosis.  Right  heel ulcer image below.  Right knee effusion with tenderness on palpation. Skin: Diaphoretic, intact, normal color, warm to touch Neurologic: CN 2-12 grossly intact. Normal speech.  Grossly non-focal exam. Psychiatric: Alert and oriented x 3. Normal mood. Congruent affect.  Normal judgement and insight.    Labs on Admission: I have personally reviewed following labs and imaging studies  CBC: Recent Labs  Lab 10/03/19 1649 10/04/19 0503 10/05/19 0313 10/07/19 0403 10/10/19 1211  WBC 21.9* 19.0* 14.9* 12.0* 13.0*  NEUTROABS 18.5*  --   --  8.5* 9.1*  HGB 11.9* 10.7* 10.6* 9.4* 11.2*  HCT 34.5* 32.7* 31.0* 28.9* 32.8*  MCV 88.9 92.6 89.1 91.5 87.7  PLT 204 187 179 189 127   Basic Metabolic Panel: Recent Labs  Lab 10/04/19 0503 10/05/19 0313 10/06/19 0443 10/07/19 0403 10/10/19 1211  NA 137 138 137 138 138  K 5.5* 4.8 4.2 4.2 4.9  CL 108 108 109 112* 111  CO2 18* 20* 18* 18* 18*  GLUCOSE 204* 147* 160* 148* 203*  BUN 41* 50* 45* 42* 43*  CREATININE 2.53* 2.87* 2.54* 2.27* 2.02*  CALCIUM  8.7* 8.3* 8.0* 8.3* 8.5*   GFR: Estimated Creatinine Clearance: 29.7 mL/min (A) (by C-G formula based on SCr of 2.02 mg/dL (H)). Liver Function Tests: Recent Labs  Lab 10/03/19 1720 10/10/19 1211  AST 24 85*  ALT 16 73*  ALKPHOS 96 95  BILITOT 0.9 0.7  PROT 8.0 8.4*  ALBUMIN 3.7 3.0*   No results for input(s): LIPASE, AMYLASE in the last 168 hours. No results for input(s): AMMONIA in the last 168 hours. Coagulation Profile: Recent Labs  Lab 10/10/19 1211  INR 1.2   Cardiac Enzymes: Recent Labs  Lab 10/04/19 0503 10/10/19 1211  CKTOTAL 208 110   BNP (last 3 results) No results for input(s): PROBNP in the last 8760 hours. HbA1C: No results for input(s): HGBA1C in the last 72 hours. CBG: Recent Labs  Lab 10/06/19 1142 10/06/19 1631 10/06/19 2111 10/07/19 0745 10/07/19 1144  GLUCAP 149* 184* 111* 117* 206*   Lipid Profile: No results for input(s): CHOL, HDL, LDLCALC, TRIG, CHOLHDL, LDLDIRECT in the last 72 hours. Thyroid Function Tests: No results for input(s): TSH, T4TOTAL, FREET4, T3FREE, THYROIDAB in the last 72 hours. Anemia Panel: No results for input(s): VITAMINB12, FOLATE, FERRITIN, TIBC, IRON, RETICCTPCT in the last 72 hours. Urine analysis:    Component Value Date/Time   COLORURINE YELLOW (A) 10/03/2019 2218   APPEARANCEUR CLOUDY (A) 10/03/2019 2218   APPEARANCEUR Clear 04/21/2013 0715   LABSPEC 1.014 10/03/2019 2218   LABSPEC 1.015 04/21/2013 0715   PHURINE 5.0 10/03/2019 2218   GLUCOSEU NEGATIVE 10/03/2019 2218   GLUCOSEU 50 mg/dL 04/21/2013 0715   HGBUR NEGATIVE 10/03/2019 2218   BILIRUBINUR NEGATIVE 10/03/2019 2218   BILIRUBINUR Negative 04/21/2013 [place] NEGATIVE 10/03/2019 2218   PROTEINUR 100 (A) 10/03/2019 2218   NITRITE NEGATIVE 10/03/2019 2218   LEUKOCYTESUR NEGATIVE 10/03/2019 2218   LEUKOCYTESUR Negative 04/21/2013 0715    Radiological Exams on Admission: CT ABDOMEN PELVIS WO CONTRAST  Result Date:  10/10/2019 CLINICAL DATA:  Found down, weakness, right foot wound, recent falls EXAM: CT CHEST, ABDOMEN AND PELVIS WITHOUT CONTRAST TECHNIQUE: Multidetector CT imaging of the chest, abdomen and pelvis was performed following the standard protocol without IV contrast. COMPARISON:  CT pelvis, 10/03/2019 FINDINGS: CT CHEST FINDINGS Cardiovascular: Aortic atherosclerosis. Normal heart size. Three-vessel coronary artery calcifications no pericardial effusion. Mediastinum/Nodes: No enlarged mediastinal, hilar, or axillary lymph nodes. Thyroid  gland, trachea, and esophagus demonstrate no significant findings. Lungs/Pleura: There is dependent heterogeneous and bandlike consolidation of the left greater than right lungs, most conspicuous in the left lung base (series 5, image 93). No pleural effusion or pneumothorax. Musculoskeletal: No chest wall mass or suspicious bone lesions identified. CT ABDOMEN PELVIS FINDINGS Hepatobiliary: No solid liver abnormality is seen. No gallstones, gallbladder wall thickening, or biliary dilatation. Pancreas: Unremarkable. No pancreatic ductal dilatation or surrounding inflammatory changes. Spleen: Normal in size without significant abnormality. Adrenals/Urinary Tract: Adrenal glands are unremarkable. Numerous bilateral renal calculi and/or vascular calcifications. No hydronephrosis. Bladder is unremarkable. Stomach/Bowel: Stomach is within normal limits. Appendix appears normal. No evidence of bowel wall thickening, distention, or inflammatory changes. Vascular/Lymphatic: Aortic atherosclerosis. No enlarged abdominal or pelvic lymph nodes. Reproductive: No mass or other abnormality. Other: No abdominal wall hernia or abnormality. No abdominopelvic ascites. Musculoskeletal: No acute or significant osseous findings. IMPRESSION: 1. There is dependent heterogeneous and bandlike consolidation of the left greater than right lungs, most conspicuous in the left lung base. Findings are consistent  with multifocal infection or aspiration. 2. No noncontrast CT evidence of acute traumatic injury to the abdomen or pelvis. 3. Numerous bilateral renal calculi and/or vascular calcifications. No hydronephrosis. 4. Coronary artery disease.  Aortic Atherosclerosis (ICD10-I70.0). Electronically Signed   By: Eddie Candle M.D.   On: 10/10/2019 14:06   CT Head Wo Contrast  Result Date: 10/10/2019 CLINICAL DATA:  Chronic trauma. Head/C-spine injury suspected. Patient was found down since Thursday. EXAM: CT HEAD WITHOUT CONTRAST CT CERVICAL SPINE WITHOUT CONTRAST TECHNIQUE: Multidetector CT imaging of the head and cervical spine was performed following the standard protocol without intravenous contrast. Multiplanar CT image reconstructions of the cervical spine were also generated. COMPARISON:  04/20/2018 FINDINGS: CT HEAD FINDINGS Brain: There is central and cortical atrophy. Periventricular white matter changes are consistent with small vessel disease. There is no intra or extra-axial fluid collection or mass lesion. The basilar cisterns and ventricles have a normal appearance. There is no CT evidence for acute infarction or hemorrhage. Vascular: There is atherosclerotic calcification of the internal carotid arteries. No hyperdense vessels. Skull: Normal. Negative for fracture or focal lesion. Sinuses/Orbits: There is mucosal thickening of the maxillary sinuses bilaterally. No evidence for acute sinus fracture. Mastoid air cells are normally aerated. Other: None. CT CERVICAL SPINE FINDINGS Alignment: Normal. Skull base and vertebrae: No acute fracture. No primary bone lesion or focal pathologic process. Soft tissues and spinal canal: No prevertebral fluid or swelling. No visible canal hematoma. Disc levels: Mild disc height loss and uncovertebral spurring at C5-6. Upper chest: Unremarkable. Other: Atherosclerosis of the vertebral arteries. IMPRESSION: 1. No evidence for acute intracranial abnormality. 2. Atrophy and  small vessel disease. 3. Chronic sinusitis. 4. No evidence for acute cervical spine abnormality. 5. Mild degenerative changes in the mid cervical spine. Electronically Signed   By: Nolon Nations M.D.   On: 10/10/2019 14:04   CT Chest Wo Contrast  Result Date: 10/10/2019 CLINICAL DATA:  Found down, weakness, right foot wound, recent falls EXAM: CT CHEST, ABDOMEN AND PELVIS WITHOUT CONTRAST TECHNIQUE: Multidetector CT imaging of the chest, abdomen and pelvis was performed following the standard protocol without IV contrast. COMPARISON:  CT pelvis, 10/03/2019 FINDINGS: CT CHEST FINDINGS Cardiovascular: Aortic atherosclerosis. Normal heart size. Three-vessel coronary artery calcifications no pericardial effusion. Mediastinum/Nodes: No enlarged mediastinal, hilar, or axillary lymph nodes. Thyroid gland, trachea, and esophagus demonstrate no significant findings. Lungs/Pleura: There is dependent heterogeneous and bandlike consolidation of the left greater  than right lungs, most conspicuous in the left lung base (series 5, image 93). No pleural effusion or pneumothorax. Musculoskeletal: No chest wall mass or suspicious bone lesions identified. CT ABDOMEN PELVIS FINDINGS Hepatobiliary: No solid liver abnormality is seen. No gallstones, gallbladder wall thickening, or biliary dilatation. Pancreas: Unremarkable. No pancreatic ductal dilatation or surrounding inflammatory changes. Spleen: Normal in size without significant abnormality. Adrenals/Urinary Tract: Adrenal glands are unremarkable. Numerous bilateral renal calculi and/or vascular calcifications. No hydronephrosis. Bladder is unremarkable. Stomach/Bowel: Stomach is within normal limits. Appendix appears normal. No evidence of bowel wall thickening, distention, or inflammatory changes. Vascular/Lymphatic: Aortic atherosclerosis. No enlarged abdominal or pelvic lymph nodes. Reproductive: No mass or other abnormality. Other: No abdominal wall hernia or abnormality.  No abdominopelvic ascites. Musculoskeletal: No acute or significant osseous findings. IMPRESSION: 1. There is dependent heterogeneous and bandlike consolidation of the left greater than right lungs, most conspicuous in the left lung base. Findings are consistent with multifocal infection or aspiration. 2. No noncontrast CT evidence of acute traumatic injury to the abdomen or pelvis. 3. Numerous bilateral renal calculi and/or vascular calcifications. No hydronephrosis. 4. Coronary artery disease.  Aortic Atherosclerosis (ICD10-I70.0). Electronically Signed   By: Eddie Candle M.D.   On: 10/10/2019 14:06   CT Cervical Spine Wo Contrast  Result Date: 10/10/2019 CLINICAL DATA:  Chronic trauma. Head/C-spine injury suspected. Patient was found down since Thursday. EXAM: CT HEAD WITHOUT CONTRAST CT CERVICAL SPINE WITHOUT CONTRAST TECHNIQUE: Multidetector CT imaging of the head and cervical spine was performed following the standard protocol without intravenous contrast. Multiplanar CT image reconstructions of the cervical spine were also generated. COMPARISON:  04/20/2018 FINDINGS: CT HEAD FINDINGS Brain: There is central and cortical atrophy. Periventricular white matter changes are consistent with small vessel disease. There is no intra or extra-axial fluid collection or mass lesion. The basilar cisterns and ventricles have a normal appearance. There is no CT evidence for acute infarction or hemorrhage. Vascular: There is atherosclerotic calcification of the internal carotid arteries. No hyperdense vessels. Skull: Normal. Negative for fracture or focal lesion. Sinuses/Orbits: There is mucosal thickening of the maxillary sinuses bilaterally. No evidence for acute sinus fracture. Mastoid air cells are normally aerated. Other: None. CT CERVICAL SPINE FINDINGS Alignment: Normal. Skull base and vertebrae: No acute fracture. No primary bone lesion or focal pathologic process. Soft tissues and spinal canal: No prevertebral  fluid or swelling. No visible canal hematoma. Disc levels: Mild disc height loss and uncovertebral spurring at C5-6. Upper chest: Unremarkable. Other: Atherosclerosis of the vertebral arteries. IMPRESSION: 1. No evidence for acute intracranial abnormality. 2. Atrophy and small vessel disease. 3. Chronic sinusitis. 4. No evidence for acute cervical spine abnormality. 5. Mild degenerative changes in the mid cervical spine. Electronically Signed   By: Nolon Nations M.D.   On: 10/10/2019 14:04   DG Chest Port 1 View  Result Date: 10/10/2019 CLINICAL DATA:  Shortness of breath EXAM: PORTABLE CHEST 1 VIEW COMPARISON:  10/05/2019 chest x-ray 10/03/2019 FINDINGS: Lung volumes slightly diminished. Heart size accentuated by low lung volumes. Mildly globular appearance of cardiac silhouette. Hilar structures grossly normal. Developing airspace disease in the LEFT chest with linear features in the mid chest and subtle retrocardiac densities. No acute skeletal process on limited assessment. IMPRESSION: 1. Developing airspace disease in the LEFT chest, platelike appearance favors atelectasis. Difficult to exclude developing infection at the LEFT lung base. 2. Mildly globular appearance of the cardiac silhouette, could be seen in the setting of developing pericardial effusion. Electronically Signed  By: Zetta Bills M.D.   On: 10/10/2019 12:38    EKG: Independently reviewed.    Assessment/Plan Principal Problem:   Sepsis ([place]) Active Problems:   Aspiration pneumonia (HCC)   Right leg pain   Diabetic ulcer of right foot associated with type 2 diabetes mellitus, with fat layer exposed ([place])   CKD stage 4 due to type 2 diabetes mellitus (HCC)   HTN (hypertension)   Hypothyroidism (acquired)   Hyperlipidemia    Sepsis due to bilateral aspiration pneumonia - present on admission with fever, leukocytosis and imaging consistent with aspiration PNA in both bases, worse on the left.  No endorgan dysfunction and  lactic acid 1.5.  Received broad-spectrum antibiotics and fluid resuscitation in the ED. --IV vancomycin and cefepime --Maintenance IV fluids --Follow-up repeat lactate --SLP consulted for swallow evaluation --N.p.o. except ice chips and sips with meds for now --Telemetry monitoring --Repeat pro-Cal with a.m. labs --Tylenol as needed fever --Follow-up blood and sputum cultures  Generalized Weakness - POA.  Last admission SNF recommended but patient declined and family declined.  PT/OT evaluations.  TOC consult for SNF placement.    Right leg pain - POA, secondary slipping out of bed prior to her previous admission.  Prior imaging was negative for fractures.  PT evaluation.  Pain control as needed.  No indication for orthopedic consult at this time.  Diabetic ulcer of right foot -present on admission was discharged on 7/1 with 7 remaining days of Augmentin.  See image in exam below.  Will be covered with IV antibiotics as above.  Follow-up with podiatry outpatient.  Type 2 Diabetes - poorly controlled, A1c 7.3%.  Appears she takes insulin 70/30 at home.  Will cover with Lantus 8 units at bedtime and sliding scale NovoLog.  CKD stage 4 -creatinine on admission 2.02, improved from 2.27 on 7/1.  This is at or better than baseline which was 2.3 as of March 2021.  Monitor renal function daily.  Avoid nephrotoxins and hypotension.  Renally dose meds as indicated.  Hypertension -continue home amlodipine, Coreg.  (Pending med history)  Hypothyroidism - continue home levothyroxine. (Pending med history)  Hyperlipidemia -continue Lipitor. (Pending med history)  DVT prophylaxis: heparin injection 5,000 Units Start: 10/10/19 1500   Code Status: Full Family Communication: None at bedside, will attempt to call Disposition Plan: Patient needs to go to SNF Consults called: TOC   Admission status:  Status is: Inpatient  Remains inpatient appropriate because:IV treatments appropriate due to  intensity of illness or inability to take PO   Dispo: The patient is from: Home              Anticipated d/c is to: SNF              Anticipated d/c date is: 3 days              Patient currently is not medically stable to d/c.    Ezekiel Slocumb, DO [facility]  10/10/2019, 3:20 PM    If 7PM-7AM, please contact night-coverage. How to contact the [facility] Attending or Consulting provider [place] or covering provider during after hours [place], for this patient?    1. Check the care team in [facility] and look for a) attending/consulting [facility] provider listed and b) the [facility] team listed 2. Log into [email or web address] and use [place]'s universal password to access. If you do not have the password, please contact the hospital operator. 3. Locate the [facility] provider you are  looking for under [facility] and page to a number that you can be directly reached. 4. If you still have difficulty reaching the provider, please page the [facility] (Director on Call) for the Hospitalists listed on amion for assistance.

## 2019-10-10 NOTE — TOC Initial Note (Signed)
Transition of Care [facility]) - Initial/Assessment Note    Patient Details  Name: [name] [name] MRN: [id number] Date of Birth: December 22, 1945  Transition of Care [facility]) CM/SW Contact:    Verdell Carmine, RN Phone Number:[phone] 10/10/2019, 3:39 PM  Clinical Narrative:                 Patient arrived to [facility] today via squad after [facility] PT called them. I spoke first to Victor Who referred me to PT Elder Cyphers [phone].. I spoke to [place]. Who said that she arrived to the home early on the Sons call from the day before. The patients son stated that since discharge, the patient has not been able to ambulate, When she came home initially from the hosptial, she fell on the steps and he had to call the rescue squad to get her in the house. She said that since then he stated she has been unable to be moved form the bed and had periods of confusion. He has checked her sugars and tried to get her to eat, but he could not get her up. He has been wanting to call to get her to the hospital but she had been refusing. Last night she started to hallucinate. Jeani Hawking came in this AM and the patient was in the bedroom, she was incontinent. He vitals were Bp 160/84 HR 92 saturations 88% on room air temp 99.8. RR 26. She was confused. She called the rescue squad and helped the son try to clean her. She stated it took four of them to turn and reposition her for cleaning and get her on the stretcher.for transport to the hospital., She stated she spoke to him about the importance of getting his mother to rehab/SNF to get her stronger. She said to this CM that he stated he understood that she needed to go there. This CM spoke via chat to Dr Arbutus Ped and  K Main RN about the above , so all are aware of the events.    Expected Discharge Plan: [place] Barriers to Discharge: Continued Medical Work up   Patient Goals and CMS Choice        Expected Discharge Plan and  Services Expected Discharge Plan: [place] Choice: [place] arrangements for the past 2 months: Single Family Home                                      Prior Living Arrangements/Services Living arrangements for the past 2 months: Single Family Home Lives with:: Adult Children                   Activities of Daily Living      Permission Sought/Granted                  Emotional Assessment           Psych Involvement: No (comment)  Admission diagnosis:  Sepsis ([place]) [A41.9] Patient Active Problem List   Diagnosis Date Noted  . Aspiration pneumonia ([place]) 10/10/2019  . Hyperkalemia   . Rib pain on left side   . Leukocytosis 10/04/2019  . Fall at home, initial encounter 10/04/2019  . Cellulitis of right leg 10/04/2019  . CKD stage 4 due to type 2 diabetes mellitus ([place]) 10/04/2019  . Acute kidney injury  superimposed on CKD ([place])   . RUQ pain   . Hyperlipidemia   . Right leg pain 10/03/2019  . Diabetic ulcer of right foot associated with type 2 diabetes mellitus, with fat layer exposed ([place]) 10/03/2019  . Cellulitis of right foot 10/03/2019  . HTN (hypertension) 10/03/2019  . Type 2 diabetes mellitus with other specified complication ([place]) 40/00/5056  . Hypothyroidism (acquired) 10/03/2019  . UTI (urinary tract infection) 10/03/2019  . Sepsis ([place]) 04/25/2018   PCP:  Marguerita Merles, MD Pharmacy:   [place], [place] [facility] [place] [address] [place] [address] Phone: [phone] Fax: [phone]     Social Determinants of Health (SDOH) Interventions    Readmission Risk Interventions No flowsheet data found.

## 2019-10-11 LAB — CBC
HCT: 30.7 % — ABNORMAL LOW (ref 36.0–46.0)
Hemoglobin: 10.1 g/dL — ABNORMAL LOW (ref 12.0–15.0)
MCH: 30 pg (ref 26.0–34.0)
MCHC: 32.9 g/dL (ref 30.0–36.0)
MCV: 91.1 fL (ref 80.0–100.0)
Platelets: 244 10*3/uL (ref 150–400)
RBC: 3.37 MIL/uL — ABNORMAL LOW (ref 3.87–5.11)
RDW: 13.6 % (ref 11.5–15.5)
WBC: 11.4 10*3/uL — ABNORMAL HIGH (ref 4.0–10.5)
nRBC: 0 % (ref 0.0–0.2)

## 2019-10-11 LAB — CORTISOL-AM, BLOOD: Cortisol - AM: 17.9 ug/dL (ref 6.7–22.6)

## 2019-10-11 LAB — BASIC METABOLIC PANEL
Anion gap: 6 (ref 5–15)
BUN: 42 mg/dL — ABNORMAL HIGH (ref 8–23)
CO2: 17 mmol/L — ABNORMAL LOW (ref 22–32)
Calcium: 8.4 mg/dL — ABNORMAL LOW (ref 8.9–10.3)
Chloride: 118 mmol/L — ABNORMAL HIGH (ref 98–111)
Creatinine, Ser: 1.88 mg/dL — ABNORMAL HIGH (ref 0.44–1.00)
GFR calc Af Amer: 30 mL/min — ABNORMAL LOW (ref >60)
GFR calc non Af Amer: 26 mL/min — ABNORMAL LOW (ref >60)
Glucose, Bld: 137 mg/dL — ABNORMAL HIGH (ref 70–99)
Potassium: 4.3 mmol/L (ref 3.5–5.1)
Sodium: 141 mmol/L (ref 135–145)

## 2019-10-11 LAB — GLUCOSE, CAPILLARY
Glucose-Capillary: 143 mg/dL — ABNORMAL HIGH (ref 70–99)
Glucose-Capillary: 131 mg/dL — ABNORMAL HIGH (ref 70–99)
Glucose-Capillary: 127 mg/dL — ABNORMAL HIGH (ref 70–99)
Glucose-Capillary: 146 mg/dL — ABNORMAL HIGH (ref 70–99)

## 2019-10-11 LAB — PROCALCITONIN: Procalcitonin: 1.24 ng/mL

## 2019-10-11 LAB — PROTIME-INR
INR: 1.2 (ref 0.8–1.2)
Prothrombin Time: 15 seconds (ref 11.4–15.2)

## 2019-10-11 MED ORDER — VANCOMYCIN HCL IN DEXTROSE 1-5 GM/200ML-% IV SOLN
1000.0000 mg | INTRAVENOUS | Status: DC
  Filled 2019-10-11: qty 200

## 2019-10-11 NOTE — Progress Notes (Signed)
PROGRESS NOTE    [name] [name]   NOB:[id number]  DOB: 1946/01/30  PCP: Marguerita Merles, MD    DOA: 10/10/2019 LOS: 1   Brief Narrative    [name] [name] is a 74 y.o. 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  Therapy had recommended SNF at that time, however patient wanted to return home.  Home health services were arranged, and patient's family agreed with being full time caregivers.  Unfortunately, patient requiring far more assistance than family able to handle, as patient has been entirely unable to ambulate and requires maximum assistance with all mobility.  In the ED, patient was febrile 100.9 F, RR 22, O2 sat 92% on room air, with stable HR and BP.  Labs notable for normal CK, improved chronic anemia, improved renal function, normal lactic acid, elevated procalcitonin 1.46 (down from prior admission).  Chest xray and chest CT showed infiltrates in the lung bases, left > right.  She was treated in the ED with broad spectrum antibiotics and IV fluids.  Admitted to hospitalist service for further management of sepsis secondary to aspiration pneumonia.     Assessment & Plan   Principal Problem:   Sepsis ([place]) Active Problems:   Aspiration pneumonia ([place])   Right leg pain   Diabetic ulcer of right foot associated with type 2 diabetes mellitus, with fat layer exposed ([place])   CKD stage 4 due to type 2 diabetes mellitus ([facility])   HTN (hypertension)   Hypothyroidism (acquired)   Hyperlipidemia   Sepsis due to bilateral aspiration pneumonia - present on admission with fever, leukocytosis and imaging consistent with aspiration PNA in both bases, worse on the left.  No endorgan  dysfunction and lactic acid 1.5.  Received broad-spectrum antibiotics and fluid resuscitation in the ED. --d/c vancomycin  --continue Cefepime --Maintenance IV fluids --Follow-up repeat lactate --SLP consulted for swallow evaluation --N.p.o. except ice chips and sips with meds for now --Telemetry monitoring --Repeat pro-Cal with a.m. labs --Tylenol as needed fever --Follow-up blood and sputum cultures  Generalized Weakness - POA.  Last admission SNF recommended but patient declined and family declined.  PT/OT evaluations.  TOC consult for SNF placement.    Right leg pain - POA, secondary slipping out of bed prior to her previous admission.  Prior imaging was negative for fractures.  PT evaluation.  Pain control as needed.  No indication for orthopedic consult at this time.  Diabetic ulcer of right foot -present on admission was discharged on 7/1 with 7 remaining days of Augmentin.  See image in exam below.  Will be covered with IV antibiotics as above.  Follow-up with podiatry outpatient.  Type 2 Diabetes - poorly controlled, A1c 7.3%.  Appears she takes insulin 70/30 at home.  Will cover with Lantus 8 units at bedtime and sliding scale NovoLog.  CKD stage 4 -creatinine on admission 2.02, improved from 2.27 on 7/1.  This is at or better than baseline which was 2.3 as of March 2021.  Monitor renal function daily.  Avoid nephrotoxins and hypotension.  Renally dose meds as indicated.  Hypertension -continue home amlodipine, Coreg.  (Pending med history)  Hypothyroidism - continue home levothyroxine. (Pending med history)  Hyperlipidemia -continue Lipitor. (Pending med history)  Patient BMI: Body mass index is 33.53 kg/m.   DVT prophylaxis: heparin injection 5,000 Units Start: 10/10/19 1500   Diet:  Diet Orders (From admission, onward)    Start     Ordered   10/10/19 1451  Diet NPO time specified Except for: Sips with Meds, Ice Chips  Diet effective now       Question Answer  Comment  Except for Sips with Meds   Except for Ice Chips      10/10/19 1453            Code Status: Full Code    Subjective 10/11/19    Patient seen at bedside this morning.  She reports feeling much better today.  She talks about how hard her son had tried to take care of her at home.  She denies any acute complaints including fevers or chills, shortness of breath, chest pain, cough, nausea vomiting or diarrhea.   Disposition Plan & Communication   Status is: Inpatient  Remains inpatient appropriate because:IV treatments appropriate due to intensity of illness or inability to take PO   Dispo: The patient is from: Home              Anticipated d/c is to: SNF              Anticipated d/c date is: 2 days              Patient currently is not medically stable to d/c.     Family Communication: None at bedside, will attempt to call  Consults, Procedures, Significant Events   Consultants:   none  Procedures:   none  Antimicrobials:   Vancomycin 7/4 >> 7/5  Cefepime 7/4 >>    Objective   Vitals:   10/10/19 2126 10/11/19 0042 10/11/19 0448 10/11/19 0802  BP: (!) 156/89 (!) 156/81 (!) 145/64 (!) 160/73  Pulse: 74 76 74 69  Resp: 18 16 17 14   Temp: 98.5 F (36.9 C) 98.4 F (36.9 C) 98.9 F (37.2 C) 98.7 F (37.1 C)  TempSrc:  Oral  Oral  SpO2: 97% 96% 99% 95%  Weight:      Height:        Intake/Output Summary (Last 24 hours) at 10/11/2019 0851 Last data filed at 10/11/2019 0805 Gross per 24 hour  Intake 2848.17 ml  Output 725 ml  Net 2123.17 ml   Filed Weights   10/10/19 1216  Weight: 97.1 kg    Physical Exam:  General exam: awake, alert, no acute distress, obese HEENT: moist mucus membranes, hearing grossly normal  Respiratory system: Decreased breath sounds but overall clear, no wheezes, rales or rhonchi, normal respiratory effort. Cardiovascular system: normal S1/S2, RRR, no pedal edema.   Gastrointestinal system: soft, NT, ND, no HSM felt,  +bowel sounds. Central nervous system: A&O x3. no gross focal neurologic deficits, normal speech Extremities: moves all, no cyanosis, normal tone Skin: dry, intact, normal temperature Psychiatry: normal mood, congruent affect  Labs   Data Reviewed: I have personally reviewed following labs and imaging studies  CBC: Recent Labs  Lab 10/05/19 0313 10/07/19 0403 10/10/19 1211 10/11/19 0617  WBC 14.9* 12.0* 13.0* 11.4*  NEUTROABS  --  8.5* 9.1*  --   HGB 10.6* 9.4* 11.2* 10.1*  HCT 31.0* 28.9* 32.8* 30.7*  MCV 89.1 91.5 87.7 91.1  PLT 179 189 249 742   Basic Metabolic Panel: Recent Labs  Lab 10/05/19 0313 10/06/19 0443 10/07/19 0403 10/10/19 1211 10/11/19 0617  NA 138 137  138 138 141  K 4.8 4.2 4.2 4.9 4.3  CL 108 109 112* 111 118*  CO2 20* 18* 18* 18* 17*  GLUCOSE 147* 160* 148* 203* 137*  BUN 50* 45* 42* 43* 42*  CREATININE 2.87* 2.54* 2.27* 2.02* 1.88*  CALCIUM 8.3* 8.0* 8.3* 8.5* 8.4*   GFR: Estimated Creatinine Clearance: 31.9 mL/min (A) (by C-G formula based on SCr of 1.88 mg/dL (H)). Liver Function Tests: Recent Labs  Lab 10/10/19 1211  AST 85*  ALT 73*  ALKPHOS 95  BILITOT 0.7  PROT 8.4*  ALBUMIN 3.0*   No results for input(s): LIPASE, AMYLASE in the last 168 hours. No results for input(s): AMMONIA in the last 168 hours. Coagulation Profile: Recent Labs  Lab 10/10/19 1211 10/11/19 0617  INR 1.2 1.2   Cardiac Enzymes: Recent Labs  Lab 10/10/19 1211  CKTOTAL 110   BNP (last 3 results) No results for input(s): PROBNP in the last 8760 hours. HbA1C: No results for input(s): HGBA1C in the last 72 hours. CBG: Recent Labs  Lab 10/07/19 0745 10/07/19 1144 10/10/19 1711 10/10/19 2127 10/11/19 0804  GLUCAP 117* 206* 239* 178* 143*   Lipid Profile: No results for input(s): CHOL, HDL, LDLCALC, TRIG, CHOLHDL, LDLDIRECT in the last 72 hours. Thyroid Function Tests: No results for input(s): TSH, T4TOTAL, FREET4, T3FREE, THYROIDAB in the last 72  hours. Anemia Panel: No results for input(s): VITAMINB12, FOLATE, FERRITIN, TIBC, IRON, RETICCTPCT in the last 72 hours. Sepsis Labs: Recent Labs  Lab 10/05/19 0313 10/06/19 0443 10/10/19 1211 10/11/19 0617  PROCALCITON 2.94 1.87 1.46 1.24  LATICACIDVEN  --   --  1.5  --     Recent Results (from the past 240 hour(s))  Blood culture (routine x 2)     Status: None   Collection Time: 10/03/19  8:13 PM   Specimen: BLOOD  Result Value Ref Range Status   Specimen Description BLOOD LEFT HAND  Final   Special Requests   Final    BOTTLES DRAWN AEROBIC AND ANAEROBIC Blood Culture results may not be optimal due to an inadequate volume of blood received in culture bottles   Culture   Final    NO GROWTH 5 DAYS Performed at [facility], [address] [place], [place] [address]    Report Status 10/08/2019 FINAL  Final  Blood culture (routine x 2)     Status: None   Collection Time: 10/03/19  8:13 PM   Specimen: BLOOD  Result Value Ref Range Status   Specimen Description BLOOD LEFT [facility]  Final   Special Requests   Final    BOTTLES DRAWN AEROBIC AND ANAEROBIC Blood Culture adequate volume   Culture   Final    NO GROWTH 5 DAYS Performed at [facility], [address], [place], [place] [address]    Report Status 10/08/2019 FINAL  Final  Urine culture     Status: None   Collection Time: 10/03/19 10:18 PM   Specimen: Urine, Random  Result Value Ref Range Status   Specimen Description   Final    URINE, RANDOM Performed at [facility], [address] [place], [place] [address]    Special Requests   Final    NONE Performed at [facility], [address], [place], [place] [address]    Culture   Final    NO GROWTH Performed at [facility], [address], [place], [place] [address]    Report Status 10/05/2019 FINAL  Final  Resp Panel by RT PCR (RSV, Flu A&B, Covid) - Nasopharyngeal Swab     Status: None   Collection Time: 10/04/19  12:47 AM   Specimen: Nasopharyngeal Swab  Result Value Ref Range Status   SARS Coronavirus 2 by RT PCR NEGATIVE NEGATIVE Final    Comment: (NOTE) SARS-CoV-2 target nucleic acids are NOT DETECTED.  The SARS-CoV-2 RNA is generally detectable in upper respiratoy specimens during the acute phase of infection. The lowest concentration of SARS-CoV-2 viral copies this assay can detect is 131 copies/mL. A negative result does not preclude SARS-Cov-2 infection and should not be used as the sole basis for treatment or other patient management decisions. A negative result may occur with  improper specimen collection/handling, submission of specimen other than nasopharyngeal swab, presence of viral mutation(s) within the areas targeted by this assay, and inadequate number of viral copies (<131 copies/mL). A negative result must be combined with clinical observations, patient history, and epidemiological information. The expected result is Negative.  Fact Sheet for Patients:  PinkCheek.be  Fact Sheet for Healthcare Providers:  GravelBags.it  This test is no t yet approved or cleared by the [place] FDA and  has been authorized for detection and/or diagnosis of SARS-CoV-2 by FDA under an Emergency Use Authorization (EUA). This EUA will remain  in effect (meaning this test can be used) for the duration of the COVID-19 declaration under Section 564(b)(1) of the Act, 21 U.S.C. section 360bbb-3(b)(1), unless the authorization is terminated or revoked sooner.     Influenza A by PCR NEGATIVE NEGATIVE Final   Influenza B by PCR NEGATIVE NEGATIVE Final    Comment: (NOTE) The Xpert Xpress SARS-CoV-2/FLU/RSV assay is intended as an aid in  the diagnosis of influenza from Nasopharyngeal swab specimens and  should not be used as a sole basis for treatment. Nasal washings and  aspirates are unacceptable for Xpert Xpress SARS-CoV-2/FLU/RSV   testing.  Fact Sheet for Patients: PinkCheek.be  Fact Sheet for Healthcare Providers: GravelBags.it  This test is not yet approved or cleared by the [place] FDA and  has been authorized for detection and/or diagnosis of SARS-CoV-2 by  FDA under an Emergency Use Authorization (EUA). This EUA will remain  in effect (meaning this test can be used) for the duration of the  Covid-19 declaration under Section 564(b)(1) of the Act, 21  U.S.C. section 360bbb-3(b)(1), unless the authorization is  terminated or revoked.    Respiratory Syncytial Virus by PCR NEGATIVE NEGATIVE Final    Comment: (NOTE) Fact Sheet for Patients: PinkCheek.be  Fact Sheet for Healthcare Providers: GravelBags.it  This test is not yet approved or cleared by the [place] FDA and  has been authorized for detection and/or diagnosis of SARS-CoV-2 by  FDA under an Emergency Use Authorization (EUA). This EUA will remain  in effect (meaning this test can be used) for the duration of the  COVID-19 declaration under Section 564(b)(1) of the Act, 21 U.S.C.  section 360bbb-3(b)(1), unless the authorization is terminated or  revoked. Performed at [facility], [address] [place], [place] [address]   Blood Culture (routine x 2)     Status: None (Preliminary result)   Collection Time: 10/10/19 12:11 PM   Specimen: BLOOD  Result Value Ref Range Status   Specimen Description BLOOD LEFT ANTECUBITAL  Final   Special Requests   Final    BOTTLES DRAWN AEROBIC AND ANAEROBIC Blood Culture results may not be optimal due to an inadequate volume of blood received  in culture bottles   Culture   Final    NO GROWTH < 24 HOURS Performed at [facility], [address] [place], [place] [address]    Report Status PENDING  Incomplete  Blood Culture (routine x 2)     Status: None  (Preliminary result)   Collection Time: 10/10/19 12:11 PM   Specimen: BLOOD  Result Value Ref Range Status   Specimen Description BLOOD BLOOD RIGHT FOREARM  Final   Special Requests   Final    BOTTLES DRAWN AEROBIC AND ANAEROBIC Blood Culture results may not be optimal due to an inadequate volume of blood received in culture bottles   Culture   Final    NO GROWTH < 24 HOURS Performed at [facility], [address], [place], [place] [address]    Report Status PENDING  Incomplete  SARS Coronavirus 2 by RT PCR (hospital order, performed in [place] hospital lab) Nasopharyngeal Nasopharyngeal Swab     Status: None   Collection Time: 10/10/19 12:11 PM   Specimen: Nasopharyngeal Swab  Result Value Ref Range Status   SARS Coronavirus 2 NEGATIVE NEGATIVE Final    Comment: (NOTE) SARS-CoV-2 target nucleic acids are NOT DETECTED.  The SARS-CoV-2 RNA is generally detectable in upper and lower respiratory specimens during the acute phase of infection. The lowest concentration of SARS-CoV-2 viral copies this assay can detect is 250 copies / mL. A negative result does not preclude SARS-CoV-2 infection and should not be used as the sole basis for treatment or other patient management decisions.  A negative result may occur with improper specimen collection / handling, submission of specimen other than nasopharyngeal swab, presence of viral mutation(s) within the areas targeted by this assay, and inadequate number of viral copies (<250 copies / mL). A negative result must be combined with clinical observations, patient history, and epidemiological information.  Fact Sheet for Patients:   StrictlyIdeas.no  Fact Sheet for Healthcare Providers: BankingDealers.co.za  This test is not yet approved or  cleared by the [place] FDA and has been authorized for detection and/or diagnosis of SARS-CoV-2 by FDA under an Emergency Use  Authorization (EUA).  This EUA will remain in effect (meaning this test can be used) for the duration of the COVID-19 declaration under Section 564(b)(1) of the Act, 21 U.S.C. section 360bbb-3(b)(1), unless the authorization is terminated or revoked sooner.  Performed at [facility], [address] [place], [place] [address]       Imaging Studies   CT ABDOMEN PELVIS WO CONTRAST  Result Date: 10/10/2019 CLINICAL DATA:  Found down, weakness, right foot wound, recent falls EXAM: CT CHEST, ABDOMEN AND PELVIS WITHOUT CONTRAST TECHNIQUE: Multidetector CT imaging of the chest, abdomen and pelvis was performed following the standard protocol without IV contrast. COMPARISON:  CT pelvis, 10/03/2019 FINDINGS: CT CHEST FINDINGS Cardiovascular: Aortic atherosclerosis. Normal heart size. Three-vessel coronary artery calcifications no pericardial effusion. Mediastinum/Nodes: No enlarged mediastinal, hilar, or axillary lymph nodes. Thyroid gland, trachea, and esophagus demonstrate no significant findings. Lungs/Pleura: There is dependent heterogeneous and bandlike consolidation of the left greater than right lungs, most conspicuous in the left lung base (series 5, image 93). No pleural effusion or pneumothorax. Musculoskeletal: No chest wall mass or suspicious bone lesions identified. CT ABDOMEN PELVIS FINDINGS Hepatobiliary: No solid liver abnormality is seen. No gallstones, gallbladder wall thickening, or biliary dilatation. Pancreas: Unremarkable. No pancreatic ductal dilatation or surrounding inflammatory changes. Spleen: Normal in size without significant abnormality. Adrenals/Urinary Tract: Adrenal glands are  unremarkable. Numerous bilateral renal calculi and/or vascular calcifications. No hydronephrosis. Bladder is unremarkable. Stomach/Bowel: Stomach is within normal limits. Appendix appears normal. No evidence of bowel wall thickening, distention, or inflammatory changes. Vascular/Lymphatic:  Aortic atherosclerosis. No enlarged abdominal or pelvic lymph nodes. Reproductive: No mass or other abnormality. Other: No abdominal wall hernia or abnormality. No abdominopelvic ascites. Musculoskeletal: No acute or significant osseous findings. IMPRESSION: 1. There is dependent heterogeneous and bandlike consolidation of the left greater than right lungs, most conspicuous in the left lung base. Findings are consistent with multifocal infection or aspiration. 2. No noncontrast CT evidence of acute traumatic injury to the abdomen or pelvis. 3. Numerous bilateral renal calculi and/or vascular calcifications. No hydronephrosis. 4. Coronary artery disease.  Aortic Atherosclerosis (ICD10-I70.0). Electronically Signed   By: Eddie Candle M.D.   On: 10/10/2019 14:06   CT Head Wo Contrast  Result Date: 10/10/2019 CLINICAL DATA:  Chronic trauma. Head/C-spine injury suspected. Patient was found down since Thursday. EXAM: CT HEAD WITHOUT CONTRAST CT CERVICAL SPINE WITHOUT CONTRAST TECHNIQUE: Multidetector CT imaging of the head and cervical spine was performed following the standard protocol without intravenous contrast. Multiplanar CT image reconstructions of the cervical spine were also generated. COMPARISON:  04/20/2018 FINDINGS: CT HEAD FINDINGS Brain: There is central and cortical atrophy. Periventricular white matter changes are consistent with small vessel disease. There is no intra or extra-axial fluid collection or mass lesion. The basilar cisterns and ventricles have a normal appearance. There is no CT evidence for acute infarction or hemorrhage. Vascular: There is atherosclerotic calcification of the internal carotid arteries. No hyperdense vessels. Skull: Normal. Negative for fracture or focal lesion. Sinuses/Orbits: There is mucosal thickening of the maxillary sinuses bilaterally. No evidence for acute sinus fracture. Mastoid air cells are normally aerated. Other: None. CT CERVICAL SPINE FINDINGS Alignment:  Normal. Skull base and vertebrae: No acute fracture. No primary bone lesion or focal pathologic process. Soft tissues and spinal canal: No prevertebral fluid or swelling. No visible canal hematoma. Disc levels: Mild disc height loss and uncovertebral spurring at C5-6. Upper chest: Unremarkable. Other: Atherosclerosis of the vertebral arteries. IMPRESSION: 1. No evidence for acute intracranial abnormality. 2. Atrophy and small vessel disease. 3. Chronic sinusitis. 4. No evidence for acute cervical spine abnormality. 5. Mild degenerative changes in the mid cervical spine. Electronically Signed   By: Nolon Nations M.D.   On: 10/10/2019 14:04   CT Chest Wo Contrast  Result Date: 10/10/2019 CLINICAL DATA:  Found down, weakness, right foot wound, recent falls EXAM: CT CHEST, ABDOMEN AND PELVIS WITHOUT CONTRAST TECHNIQUE: Multidetector CT imaging of the chest, abdomen and pelvis was performed following the standard protocol without IV contrast. COMPARISON:  CT pelvis, 10/03/2019 FINDINGS: CT CHEST FINDINGS Cardiovascular: Aortic atherosclerosis. Normal heart size. Three-vessel coronary artery calcifications no pericardial effusion. Mediastinum/Nodes: No enlarged mediastinal, hilar, or axillary lymph nodes. Thyroid gland, trachea, and esophagus demonstrate no significant findings. Lungs/Pleura: There is dependent heterogeneous and bandlike consolidation of the left greater than right lungs, most conspicuous in the left lung base (series 5, image 93). No pleural effusion or pneumothorax. Musculoskeletal: No chest wall mass or suspicious bone lesions identified. CT ABDOMEN PELVIS FINDINGS Hepatobiliary: No solid liver abnormality is seen. No gallstones, gallbladder wall thickening, or biliary dilatation. Pancreas: Unremarkable. No pancreatic ductal dilatation or surrounding inflammatory changes. Spleen: Normal in size without significant abnormality. Adrenals/Urinary Tract: Adrenal glands are unremarkable. Numerous  bilateral renal calculi and/or vascular calcifications. No hydronephrosis. Bladder is unremarkable. Stomach/Bowel: Stomach is within normal limits.  Appendix appears normal. No evidence of bowel wall thickening, distention, or inflammatory changes. Vascular/Lymphatic: Aortic atherosclerosis. No enlarged abdominal or pelvic lymph nodes. Reproductive: No mass or other abnormality. Other: No abdominal wall hernia or abnormality. No abdominopelvic ascites. Musculoskeletal: No acute or significant osseous findings. IMPRESSION: 1. There is dependent heterogeneous and bandlike consolidation of the left greater than right lungs, most conspicuous in the left lung base. Findings are consistent with multifocal infection or aspiration. 2. No noncontrast CT evidence of acute traumatic injury to the abdomen or pelvis. 3. Numerous bilateral renal calculi and/or vascular calcifications. No hydronephrosis. 4. Coronary artery disease.  Aortic Atherosclerosis (ICD10-I70.0). Electronically Signed   By: Eddie Candle M.D.   On: 10/10/2019 14:06   CT Cervical Spine Wo Contrast  Result Date: 10/10/2019 CLINICAL DATA:  Chronic trauma. Head/C-spine injury suspected. Patient was found down since Thursday. EXAM: CT HEAD WITHOUT CONTRAST CT CERVICAL SPINE WITHOUT CONTRAST TECHNIQUE: Multidetector CT imaging of the head and cervical spine was performed following the standard protocol without intravenous contrast. Multiplanar CT image reconstructions of the cervical spine were also generated. COMPARISON:  04/20/2018 FINDINGS: CT HEAD FINDINGS Brain: There is central and cortical atrophy. Periventricular white matter changes are consistent with small vessel disease. There is no intra or extra-axial fluid collection or mass lesion. The basilar cisterns and ventricles have a normal appearance. There is no CT evidence for acute infarction or hemorrhage. Vascular: There is atherosclerotic calcification of the internal carotid arteries. No hyperdense  vessels. Skull: Normal. Negative for fracture or focal lesion. Sinuses/Orbits: There is mucosal thickening of the maxillary sinuses bilaterally. No evidence for acute sinus fracture. Mastoid air cells are normally aerated. Other: None. CT CERVICAL SPINE FINDINGS Alignment: Normal. Skull base and vertebrae: No acute fracture. No primary bone lesion or focal pathologic process. Soft tissues and spinal canal: No prevertebral fluid or swelling. No visible canal hematoma. Disc levels: Mild disc height loss and uncovertebral spurring at C5-6. Upper chest: Unremarkable. Other: Atherosclerosis of the vertebral arteries. IMPRESSION: 1. No evidence for acute intracranial abnormality. 2. Atrophy and small vessel disease. 3. Chronic sinusitis. 4. No evidence for acute cervical spine abnormality. 5. Mild degenerative changes in the mid cervical spine. Electronically Signed   By: Nolon Nations M.D.   On: 10/10/2019 14:04   DG Chest Port 1 View  Result Date: 10/10/2019 CLINICAL DATA:  Shortness of breath EXAM: PORTABLE CHEST 1 VIEW COMPARISON:  10/05/2019 chest x-ray 10/03/2019 FINDINGS: Lung volumes slightly diminished. Heart size accentuated by low lung volumes. Mildly globular appearance of cardiac silhouette. Hilar structures grossly normal. Developing airspace disease in the LEFT chest with linear features in the mid chest and subtle retrocardiac densities. No acute skeletal process on limited assessment. IMPRESSION: 1. Developing airspace disease in the LEFT chest, platelike appearance favors atelectasis. Difficult to exclude developing infection at the LEFT lung base. 2. Mildly globular appearance of the cardiac silhouette, could be seen in the setting of developing pericardial effusion. Electronically Signed   By: Zetta Bills M.D.   On: 10/10/2019 12:38     Medications   Scheduled Meds: . heparin  5,000 Units Subcutaneous Q8H  . insulin aspart  0-9 Units Subcutaneous TID WC  . insulin glargine  8 Units  Subcutaneous QHS   Continuous Infusions: . sodium chloride 75 mL/hr at 10/11/19 0600  . ceFEPime (MAXIPIME) IV Stopped (10/10/19 2255)  . vancomycin         LOS: 1 day    Time spent: 30 minutes  Ezekiel Slocumb, DO [facility]  10/11/2019, 8:51 AM    If 7PM-7AM, please contact night-coverage. How to contact the [facility] Attending or Consulting provider [place] or covering provider during after hours [place], for this patient?    1. Check the care team in [facility] and look for a) attending/consulting [facility] provider listed and b) the [facility] team listed 2. Log into [email or web address] and use [place]'s universal password to access. If you do not have the password, please contact the hospital operator. 3. Locate the [facility] provider you are looking for under [facility] and page to a number that you can be directly reached. 4. If you still have difficulty reaching the provider, please page the [facility] (Director on Call) for the Hospitalists listed on amion for assistance.

## 2019-10-11 NOTE — Consult Note (Signed)
Pharmacy Antibiotic Note  [name] [name] is a 74 y.o. female admitted on 10/10/2019 with pneumonia.  Pharmacy has been consulted for Cefepime and vancomycin dosing.  Plan: Vancomycin 2g IV loading dose ordered in ED. Will start vancomycin 750mg  IV every 24 hours based on  Vancomycin Dosing protocol.   Continue Cefepime 2g IV every 12 hours.    7/5: Scr improved 2.02>1.88. Crcl 31.9 ml/min. Will adjust Vancomycin to 1 gram IV q24h.  F/u MRSA PCR: if just treating poss. PNA, then if MRSA PCR negative would consider d/c Vancomycin.    Height: 5\' 7"  (170.2 cm) Weight: 97.1 kg (214 lb 1.1 oz) IBW/kg (Calculated) : 61.6  Temp (24hrs), Avg:98.9 F (37.2 C), Min:98.3 F (36.8 C), Max:100.9 F (38.3 C)  Recent Labs  Lab 10/05/19 0313 10/06/19 0443 10/07/19 0403 10/10/19 1211 10/11/19 0617  WBC 14.9*  --  12.0* 13.0* 11.4*  CREATININE 2.87* 2.54* 2.27* 2.02* 1.88*  LATICACIDVEN  --   --   --  1.5  --     Estimated Creatinine Clearance: 31.9 mL/min (A) (by C-G formula based on SCr of 1.88 mg/dL (H)).    No Known Allergies  Antimicrobials this admission: 7/4 Cefepime >>  7/4 Vancomcyin >>   Microbiology results: 7/4 BCx: pending  7/4 UCx: pending 7/5 MRSA PCR pending  Thank you for allowing pharmacy to be a part of this patient's care.  Noralee Space, PharmD, BCPS Clinical Pharmacist 10/11/2019 9:27 AM

## 2019-10-11 NOTE — Evaluation (Signed)
Clinical/Bedside Swallow Evaluation Patient Details  Name: MIAA LATTERELL MRN: [id number] Date of Birth: 1946-04-01  Today's Date: 10/11/2019 Time: SLP Start Time (ACUTE ONLY): 0930 SLP Stop Time (ACUTE ONLY): 1000 SLP Time Calculation (min) (ACUTE ONLY): 30 min  Past Medical History:  Past Medical History:  Diagnosis Date  . CHF (congestive heart failure) ([place])   . Diabetes mellitus without complication ([place])   . Humerus fracture 04/20/2018   left  . Hyperlipidemia   . Hypertension   . Hypothyroidism   . Vertigo    several yrs ago  . Wears dentures    partial upper and lower (loose)   Past Surgical History:  Past Surgical History:  Procedure Laterality Date  . CATARACT EXTRACTION W/PHACO Left 12/01/2018   Procedure: CATARACT EXTRACTION PHACO AND INTRAOCULAR LENS PLACEMENT ([place]) LEFT DIABETIC;  Surgeon: Birder Robson, MD;  Location: [place];  Service: Ophthalmology;  Laterality: Left;  Diabetic - insulin  . COLONOSCOPY WITH PROPOFOL N/A 07/07/2019   Procedure: COLONOSCOPY WITH PROPOFOL;  Surgeon: Robert Bellow, MD;  Location: [facility] END[facility];  Service: Endoscopy;  Laterality: N/A;  . ESOPHAGOGASTRODUODENOSCOPY (EGD) WITH PROPOFOL N/A 07/07/2019   Procedure: ESOPHAGOGASTRODUODENOSCOPY (EGD) WITH PROPOFOL;  Surgeon: Robert Bellow, MD;  Location: [facility] ENDOSCOPY;  Service: Endoscopy;  Laterality: N/A;  . FOOT SURGERY Right    lesion excision   HPI:  Per admitting History and Physical "LATANYA HEMMER is a 74 y.o. 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  Patient was discharged from this hospital on Thursday, after admission for infection of her foot ulcer.  Therapy evaluated  patient, recommended SNF, however patient and her son declined and chose instead to go home with home health services and a wheelchair.  Son stated that he and other family would be able to provide assistance for the patient.  When seen in the ED on admission, patient reports son's been doing his best.  She has been unable to get up out of bed, but says she has tried.  Has had very little to eat or drink.  Does endorse that she has gotten choked with swallowing.  Reports fevers and chills, productive cough and shortness of breath but currently denies chest pain.  Denies abdominal complaints including nausea vomiting or diarrhea.  Denies dysuria or urinary frequency.  Does report that her right leg hurts specifically at the knee, says that she slipped out of bed but unclear exactly when this occurred.  No other acute complaints."   Assessment / Plan / Recommendation Clinical Impression  Pt presents with mild oral dysphagia but no overt s/s of aspiration with any tested consistency. Oral mech exam revealed structures to be functioning adequately. Several teeth missing resulting in difficulty chewing solids with delayed oral transit and mild oral residue with solids. Vocal quality remained clear throughout assessment, laryngeal elevation appeared adequate. Pt tolerated thin liquids, purees and soft solids well. Will later diet to dysphagia 3 with chopped meats. ST to f/u 1-3 days with toleration of diet. SLP Visit Diagnosis: Dysphagia, oral phase (R13.11)    Aspiration Risk  Mild aspiration risk    Diet Recommendation Dysphagia 3 (Mech soft);Thin liquid   Liquid Administration via: Straw;Cup Medication Administration: Whole meds with liquid Supervision: Biochemist, clinical  to assist with self feeding Compensations: Slow rate;Small sips/bites Postural Changes: Seated upright at 90 degrees;Remain upright for at least 30 minutes after po intake    Other  Recommendations   N/A  Follow up Recommendations    F/U with  toleration of diet    Frequency and Duration Other (Comment)  1 week       Prognosis Prognosis for Safe Diet Advancement: Good      Swallow Study   General Date of Onset: 10/10/19 HPI: Per admitting History and Physical "TAKARA SERMONS is a 74 y.o. 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  Patient was discharged from this hospital on Thursday, after admission for infection of her foot ulcer.  Therapy evaluated patient, recommended SNF, however patient and her son declined and chose instead to go home with home health services and a wheelchair.  Son stated that he and other family would be able to provide assistance for the patient.  When seen in the ED on admission, patient reports son's been doing his best.  She has been unable to get up out of bed, but says she has tried.  Has had very little to eat or drink.  Does endorse that she has gotten choked with swallowing.  Reports fevers and chills, productive cough and shortness of breath but currently denies chest pain.  Denies abdominal complaints including nausea vomiting or diarrhea.  Denies dysuria or urinary frequency.  Does report that her right leg hurts specifically at the knee, says that she slipped out of bed but unclear exactly when this occurred.  No other acute complaints." Type of Study: Bedside Swallow Evaluation Diet Prior to this Study:  (NPO changed to soft just prior to BSE) Temperature Spikes Noted: No Respiratory Status: Room air History of Recent Intubation: No Behavior/Cognition: Alert;Cooperative;Pleasant mood Oral Cavity Assessment: Within Functional Limits Oral Care Completed by SLP: No Oral Cavity - Dentition: Missing dentition;Poor  condition Vision: Functional for self-feeding Self-Feeding Abilities: Able to feed self Patient Positioning: Upright in bed Baseline Vocal Quality: Normal Volitional Cough: Strong Volitional Swallow: Able to elicit    Oral/Motor/Sensory Function Overall Oral Motor/Sensory Function: Within functional limits   Ice Chips Ice chips: Within functional limits   Thin Liquid Thin Liquid: Within functional limits Presentation: Cup;Self Fed;Spoon;Straw    Nectar Thick Nectar Thick Liquid: Not tested   Honey Thick Honey Thick Liquid: Not tested   Puree Puree: Within functional limits Presentation: Self Fed   Solid     Solid: Impaired Presentation: Self Fed Oral Phase Impairments: Reduced lingual movement/coordination;Impaired mastication Oral Phase Functional Implications: Prolonged oral transit;Impaired mastication;Oral residue      Lucila Maine 10/11/2019,10:35 AM

## 2019-10-11 NOTE — Hospital Course (Signed)
[name] [name] is a 74 y.o. 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  Therapy had recommended SNF at that time, however patient wanted to return home.  Home health services were arranged, and patient's family agreed with being full time caregivers.  Unfortunately, patient requiring far more assistance than family able to handle, as patient has been entirely unable to ambulate and requires maximum assistance with all mobility.  In the ED, patient was febrile 100.9 F, RR 22, O2 sat 92% on room air, with stable HR and BP.  Labs notable for normal CK, improved chronic anemia, improved renal function, normal lactic acid, elevated procalcitonin 1.46 (down from prior admission).  Chest xray and chest CT showed infiltrates in the lung bases, left > right.  She was treated in the ED with broad spectrum antibiotics and IV fluids.  Admitted to hospitalist service for further management of sepsis secondary to aspiration pneumonia.

## 2019-10-11 NOTE — TOC Progression Note (Signed)
Transition of Care [facility]) - Progression Note    Patient Details  Name: [name] [name] MRN: [id number] Date of Birth: 04/23/1945  Transition of Care [facility]) CM/SW Contact  Ronnita Paz, Gardiner Rhyme, LCSW Phone Number: 10/11/2019, 1:01 PM  Clinical Narrative: Met with pt to discuss discharge plan. Discussed options and she refuses to go to a rehab she wants to go home. She feels her son can assist her. This is not the case and will need son to talk with pt. Left a message for him and awaiting return call. Pt is much more care then she believes she is. Will work on a safe discharge plan for her.      Expected Discharge Plan: [place] Barriers to Discharge: Continued Medical Work up  Expected Discharge Plan and Services Expected Discharge Plan: [place] Choice: [place] arrangements for the past 2 months: Single Family Home                                       Social Determinants of Health (SDOH) Interventions    Readmission Risk Interventions No flowsheet data found.

## 2019-10-12 LAB — BASIC METABOLIC PANEL
Anion gap: 9 (ref 5–15)
BUN: 36 mg/dL — ABNORMAL HIGH (ref 8–23)
CO2: 16 mmol/L — ABNORMAL LOW (ref 22–32)
Calcium: 8.7 mg/dL — ABNORMAL LOW (ref 8.9–10.3)
Chloride: 116 mmol/L — ABNORMAL HIGH (ref 98–111)
Creatinine, Ser: 1.8 mg/dL — ABNORMAL HIGH (ref 0.44–1.00)
GFR calc Af Amer: 32 mL/min — ABNORMAL LOW (ref >60)
GFR calc non Af Amer: 27 mL/min — ABNORMAL LOW (ref >60)
Glucose, Bld: 124 mg/dL — ABNORMAL HIGH (ref 70–99)
Potassium: 4.6 mmol/L (ref 3.5–5.1)
Sodium: 141 mmol/L (ref 135–145)

## 2019-10-12 LAB — CBC WITH DIFFERENTIAL/PLATELET
Abs Immature Granulocytes: 0.09 10*3/uL — ABNORMAL HIGH (ref 0.00–0.07)
Basophils Absolute: 0 10*3/uL (ref 0.0–0.1)
Basophils Relative: 0 %
Eosinophils Absolute: 0.1 10*3/uL (ref 0.0–0.5)
Eosinophils Relative: 1 %
HCT: 29.3 % — ABNORMAL LOW (ref 36.0–46.0)
Hemoglobin: 9.7 g/dL — ABNORMAL LOW (ref 12.0–15.0)
Immature Granulocytes: 1 %
Lymphocytes Relative: 19 %
Lymphs Abs: 2.3 10*3/uL (ref 0.7–4.0)
MCH: 29.7 pg (ref 26.0–34.0)
MCHC: 33.1 g/dL (ref 30.0–36.0)
MCV: 89.6 fL (ref 80.0–100.0)
Monocytes Absolute: 1.2 10*3/uL — ABNORMAL HIGH (ref 0.1–1.0)
Monocytes Relative: 10 %
Neutro Abs: 8.3 10*3/uL — ABNORMAL HIGH (ref 1.7–7.7)
Neutrophils Relative %: 69 %
Platelets: 239 10*3/uL (ref 150–400)
RBC: 3.27 MIL/uL — ABNORMAL LOW (ref 3.87–5.11)
RDW: 13.8 % (ref 11.5–15.5)
WBC: 12.1 10*3/uL — ABNORMAL HIGH (ref 4.0–10.5)
nRBC: 0 % (ref 0.0–0.2)

## 2019-10-12 LAB — PROCALCITONIN: Procalcitonin: 0.92 ng/mL

## 2019-10-12 LAB — GLUCOSE, CAPILLARY
Glucose-Capillary: 116 mg/dL — ABNORMAL HIGH (ref 70–99)
Glucose-Capillary: 108 mg/dL — ABNORMAL HIGH (ref 70–99)
Glucose-Capillary: 143 mg/dL — ABNORMAL HIGH (ref 70–99)
Glucose-Capillary: 98 mg/dL (ref 70–99)

## 2019-10-12 LAB — MAGNESIUM: Magnesium: 2.1 mg/dL (ref 1.7–2.4)

## 2019-10-12 MED ORDER — LEVOTHYROXINE SODIUM 50 MCG PO TABS: 50.0000 ug | ORAL_TABLET | Freq: Every day | ORAL | Status: DC

## 2019-10-12 MED ORDER — CARVEDILOL 25 MG PO TABS
25.0000 mg | ORAL_TABLET | Freq: Two times a day (BID) | ORAL | Status: DC
  Administered 2019-10-12 – 2019-10-14 (×4): 25 mg via ORAL
  Filled 2019-10-12 (×4): qty 1

## 2019-10-12 MED ORDER — DONEPEZIL HCL 5 MG PO TABS
10.0000 mg | ORAL_TABLET | Freq: Every day | ORAL | Status: DC
  Administered 2019-10-13 – 2019-10-14 (×2): 10 mg via ORAL
  Filled 2019-10-12 (×2): qty 2

## 2019-10-12 MED ORDER — ATORVASTATIN CALCIUM 20 MG PO TABS
40.0000 mg | ORAL_TABLET | Freq: Every day | ORAL | Status: DC
  Administered 2019-10-13 – 2019-10-14 (×2): 40 mg via ORAL
  Filled 2019-10-12 (×2): qty 2

## 2019-10-12 MED ORDER — ASPIRIN EC 81 MG PO TBEC
81.0000 mg | DELAYED_RELEASE_TABLET | Freq: Every day | ORAL | Status: DC
  Administered 2019-10-13 – 2019-10-14 (×2): 81 mg via ORAL
  Filled 2019-10-12 (×2): qty 1

## 2019-10-12 MED ORDER — AMLODIPINE BESYLATE 10 MG PO TABS
10.0000 mg | ORAL_TABLET | Freq: Every day | ORAL | Status: DC
  Administered 2019-10-13 – 2019-10-14 (×2): 10 mg via ORAL
  Filled 2019-10-12 (×2): qty 1

## 2019-10-12 MED ORDER — LEVOTHYROXINE SODIUM 50 MCG PO TABS
50.0000 ug | ORAL_TABLET | Freq: Every day | ORAL | Status: DC
  Administered 2019-10-13 – 2019-10-14 (×2): 50 ug via ORAL
  Filled 2019-10-12 (×2): qty 1

## 2019-10-12 NOTE — Progress Notes (Signed)
Chart reviewed, Pt visited. No reports of dysphagia in chart. Pt eating upon St entering, noted to be laying at 45 degree angle. Son present and assisting with meal. Educated on need for fully upright during meals. Pt and son acknowledged understanding. Positioned upright. Pt reports she has to eat slow and that mashed potatoes were too thick, otherwise swallowing without problems. Pt reports dry lips, given lip moisturizer from oral care kit. No further St needs at this time. Please reconsult if we can further assist with this Pt.

## 2019-10-12 NOTE — Progress Notes (Addendum)
PROGRESS NOTE    [name] [name]   GEZ:[id number]  DOB: 05/29/1945  PCP: Marguerita Merles, MD    DOA: 10/10/2019 LOS: 2   Brief Narrative    [name] [name] is a 74 y.o. 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  Therapy had recommended SNF at that time, however patient wanted to return home.  Home health services were arranged, and patient's family agreed with being full time caregivers.  Unfortunately, patient requiring far more assistance than family able to handle, as patient has been entirely unable to ambulate and requires maximum assistance with all mobility.  In the ED, patient was febrile 100.9 F, RR 22, O2 sat 92% on room air, with stable HR and BP.  Labs notable for normal CK, improved chronic anemia, improved renal function, normal lactic acid, elevated procalcitonin 1.46 (down from prior admission).  Chest xray and chest CT showed infiltrates in the lung bases, left > right.  She was treated in the ED with broad spectrum antibiotics and IV fluids.  Admitted to hospitalist service for further management of sepsis secondary to aspiration pneumonia.     Assessment & Plan   Principal Problem:   Sepsis ([place]) Active Problems:   Aspiration pneumonia ([place])   Right leg pain   Diabetic ulcer of right foot associated with type 2 diabetes mellitus, with fat layer exposed ([place])   CKD stage 4 due to type 2 diabetes mellitus (H[facility])   HTN (hypertension)   Hypothyroidism (acquired)   Hyperlipidemia  Acute metabolic encephalopathy - likely due to infection.  Patient has waxing and waning periods of confusion, hallucinations (talking to people not present).  She had some similar episodes  prior admission.  No focal neurologic deficits.  Continue treating infection as below.  Monitor.   Sepsis due to bilateral aspiration pneumonia - present on admission with fever, leukocytosis and imaging consistent with aspiration PNA in both bases, worse on the left.  No endorgan dysfunction and lactic acid 1.5.  Received broad-spectrum antibiotics and fluid resuscitation in the ED. --d/c vancomycin  --continue Cefepime --Maintenance IV fluids --SLP recommends Dysphagia 3 diet --N.p.o. except ice chips and sips with meds for now --Telemetry monitoring --Repeat pro-Cal with a.m. labs --Tylenol as needed fever --Follow-up blood and sputum cultures  Generalized Weakness - POA.  Last admission SNF recommended but patient declined and family declined.  PT/OT evaluations.  TOC consult for SNF placement.   Reposition every 2 hours.  Right leg pain - POA, secondary slipping out of bed prior to her previous admission.  Prior imaging was negative for fractures.  PT evaluation.  Pain control as needed.  No indication for orthopedic consult at this time.  Diabetic ulcer of right foot -present on admission was discharged on 7/1 with 7 remaining days of Augmentin.  See image in exam below.  Will be covered with IV antibiotics as above.  WOC consulted.  Follow-up with podiatry outpatient.  Type 2 Diabetes - poorly controlled, A1c 7.3%.  Appears she takes insulin 70/30 at home.  Will cover with Lantus 8 units at bedtime and sliding scale NovoLog.  CKD stage 4 -creatinine on admission 2.02, improved from 2.27 on 7/1.  This is at or better than baseline which  was 2.3 as of March 2021.  Monitor renal function daily.  Avoid nephrotoxins and hypotension.  Renally dose meds as indicated.  Cr improved to 1.8.  Hypertension -continue home amlodipine, Coreg.    Hypothyroidism - continue home levothyroxine. (Pending med history)  Hyperlipidemia -continue Lipitor. (Pending med history)  Patient BMI:  Body mass index is 33.53 kg/m.   DVT prophylaxis: heparin injection 5,000 Units Start: 10/10/19 1500   Diet:  Diet Orders (From admission, onward)    Start     Ordered   10/11/19 1002  DIET DYS 3 Room service appropriate? Yes; Fluid consistency: Thin  Diet effective now       Comments: Finely chopped meats. Please send tray now  Question Answer Comment  Room service appropriate? Yes   Fluid consistency: Thin      10/11/19 1002            Code Status: Full Code    Subjective 10/12/19    Patient seen at bedside this morning.  She says she "is miserable" and "I don't care if I live or die".  This is very out of character for her.  She says her whole left side is hurting and needs to change position.  She is too weak to for bed mobility without assistance.  She denies fever or coughing.  Met with son at bedside this afternoon.  Patient is more delirious and having visual hallucinations, people talking to her and making fun of her.  She seems to recognize this is not real and does not seem distressed by it.  Son recognized his mother is far too weak to return home.  He reports she was also having hallucinations at home.   Disposition Plan & Communication   Status is: Inpatient  Remains inpatient appropriate because:IV treatments appropriate due to intensity of illness or inability to take PO   Dispo: The patient is from: Home              Anticipated d/c is to: SNF              Anticipated d/c date is: 2 days              Patient currently is not medically stable to d/c.     Family Communication: met with son at bedside this morning  Consults, Procedures, Significant Events   Consultants:   none  Procedures:   none  Antimicrobials:   Vancomycin 7/4 >> 7/5  Cefepime 7/4 >>    Objective   Vitals:   10/12/19 0000 10/12/19 0153 10/12/19 0600 10/12/19 0746  BP:  132/61 (!) 160/53 (!) 158/68  Pulse: 69 70 70 73  Resp: '14 17 20 20  ' Temp:  98 F (36.7 C)  98.2 F (36.8 C) 98.7 F (37.1 C)  TempSrc:   Oral Oral  SpO2: 93% 98% 96% 95%  Weight:      Height:        Intake/Output Summary (Last 24 hours) at 10/12/2019 0814 Last data filed at 10/12/2019 0659 Gross per 24 hour  Intake 810 ml  Output 1600 ml  Net -790 ml   Filed Weights   10/10/19 1216  Weight: 97.1 kg    Physical Exam:  General exam: awake, alert, no acute distress, obese HEENT: dry mucus membranes, hearing grossly normal  Respiratory system: Decreased breath sounds , no wheezes, rales or rhonchi, normal respiratory effort. Cardiovascular system: normal S1/S2, RRR, no pedal edema.   Gastrointestinal system: soft, NT, ND, +  bowel sounds. Central nervous system: A&O x3. no gross focal neurologic deficits, normal speech Extremities: moves all, no cyanosis, normal tone Skin: dry, intact, normal temperature Psychiatry: depressed mood, congruent affect  Labs   Data Reviewed: I have personally reviewed following labs and imaging studies  CBC: Recent Labs  Lab 10/07/19 0403 10/10/19 1211 10/11/19 0617 10/12/19 0529  WBC 12.0* 13.0* 11.4* 12.1*  NEUTROABS 8.5* 9.1*  --  8.3*  HGB 9.4* 11.2* 10.1* 9.7*  HCT 28.9* 32.8* 30.7* 29.3*  MCV 91.5 87.7 91.1 89.6  PLT 189 249 244 756   Basic Metabolic Panel: Recent Labs  Lab 10/06/19 0443 10/07/19 0403 10/10/19 1211 10/11/19 0617 10/12/19 0529  NA 137 138 138 141 141  K 4.2 4.2 4.9 4.3 4.6  CL 109 112* 111 118* 116*  CO2 18* 18* 18* 17* 16*  GLUCOSE 160* 148* 203* 137* 124*  BUN 45* 42* 43* 42* 36*  CREATININE 2.54* 2.27* 2.02* 1.88* 1.80*  CALCIUM 8.0* 8.3* 8.5* 8.4* 8.7*  MG  --   --   --   --  2.1   GFR: Estimated Creatinine Clearance: 33.3 mL/min (A) (by C-G formula based on SCr of 1.8 mg/dL (H)). Liver Function Tests: Recent Labs  Lab 10/10/19 1211  AST 85*  ALT 73*  ALKPHOS 95  BILITOT 0.7  PROT 8.4*  ALBUMIN 3.0*   No results for input(s): LIPASE, AMYLASE in the last 168 hours. No results for  input(s): AMMONIA in the last 168 hours. Coagulation Profile: Recent Labs  Lab 10/10/19 1211 10/11/19 0617  INR 1.2 1.2   Cardiac Enzymes: Recent Labs  Lab 10/10/19 1211  CKTOTAL 110   BNP (last 3 results) No results for input(s): PROBNP in the last 8760 hours. HbA1C: No results for input(s): HGBA1C in the last 72 hours. CBG: Recent Labs  Lab 10/11/19 0804 10/11/19 1129 10/11/19 1715 10/11/19 2111 10/12/19 0744  GLUCAP 143* 131* 127* 146* 116*   Lipid Profile: No results for input(s): CHOL, HDL, LDLCALC, TRIG, CHOLHDL, LDLDIRECT in the last 72 hours. Thyroid Function Tests: No results for input(s): TSH, T4TOTAL, FREET4, T3FREE, THYROIDAB in the last 72 hours. Anemia Panel: No results for input(s): VITAMINB12, FOLATE, FERRITIN, TIBC, IRON, RETICCTPCT in the last 72 hours. Sepsis Labs: Recent Labs  Lab 10/06/19 0443 10/10/19 1211 10/11/19 0617 10/12/19 0529  PROCALCITON 1.87 1.46 1.24 0.92  LATICACIDVEN  --  1.5  --   --     Recent Results (from the past 240 hour(s))  Blood culture (routine x 2)     Status: None   Collection Time: 10/03/19  8:13 PM   Specimen: BLOOD  Result Value Ref Range Status   Specimen Description BLOOD LEFT HAND  Final   Special Requests   Final    BOTTLES DRAWN AEROBIC AND ANAEROBIC Blood Culture results may not be optimal due to an inadequate volume of blood received in culture bottles   Culture   Final    NO GROWTH 5 DAYS Performed at [facility], [address], [place], [place] [address]    Report Status 10/08/2019 FINAL  Final  Blood culture (routine x 2)     Status: None   Collection Time: 10/03/19  8:13 PM   Specimen: BLOOD  Result Value Ref Range Status   Specimen Description BLOOD LEFT [facility]  Final   Special Requests   Final    BOTTLES DRAWN AEROBIC AND ANAEROBIC Blood Culture adequate volume   Culture   Final  NO GROWTH 5 DAYS Performed at [facility], [address] [place], [place] [address]     Report Status 10/08/2019 FINAL  Final  Urine culture     Status: None   Collection Time: 10/03/19 10:18 PM   Specimen: Urine, Random  Result Value Ref Range Status   Specimen Description   Final    URINE, RANDOM Performed at [facility], [address], [place], [place] [address]    Special Requests   Final    NONE Performed at [facility], [address], [place], [place] [address]    Culture   Final    NO GROWTH Performed at [facility], [place] [address] [place], [place] [address]    Report Status 10/05/2019 FINAL  Final  Resp Panel by RT PCR (RSV, Flu A&B, Covid) - Nasopharyngeal Swab     Status: None   Collection Time: 10/04/19 12:47 AM   Specimen: Nasopharyngeal Swab  Result Value Ref Range Status   SARS Coronavirus 2 by RT PCR NEGATIVE NEGATIVE Final    Comment: (NOTE) SARS-CoV-2 target nucleic acids are NOT DETECTED.  The SARS-CoV-2 RNA is generally detectable in upper respiratoy specimens during the acute phase of infection. The lowest concentration of SARS-CoV-2 viral copies this assay can detect is 131 copies/mL. A negative result does not preclude SARS-Cov-2 infection and should not be used as the sole basis for treatment or other patient management decisions. A negative result may occur with  improper specimen collection/handling, submission of specimen other than nasopharyngeal swab, presence of viral mutation(s) within the areas targeted by this assay, and inadequate number of viral copies (<131 copies/mL). A negative result must be combined with clinical observations, patient history, and epidemiological information. The expected result is Negative.  Fact Sheet for Patients:  PinkCheek.be  Fact Sheet for Healthcare Providers:  GravelBags.it  This test is no t yet approved or cleared by the [place] FDA and  has been authorized for detection and/or diagnosis of  SARS-CoV-2 by FDA under an Emergency Use Authorization (EUA). This EUA will remain  in effect (meaning this test can be used) for the duration of the COVID-19 declaration under Section 564(b)(1) of the Act, 21 U.S.C. section 360bbb-3(b)(1), unless the authorization is terminated or revoked sooner.     Influenza A by PCR NEGATIVE NEGATIVE Final   Influenza B by PCR NEGATIVE NEGATIVE Final    Comment: (NOTE) The Xpert Xpress SARS-CoV-2/FLU/RSV assay is intended as an aid in  the diagnosis of influenza from Nasopharyngeal swab specimens and  should not be used as a sole basis for treatment. Nasal washings and  aspirates are unacceptable for Xpert Xpress SARS-CoV-2/FLU/RSV  testing.  Fact Sheet for Patients: PinkCheek.be  Fact Sheet for Healthcare Providers: GravelBags.it  This test is not yet approved or cleared by the [place] FDA and  has been authorized for detection and/or diagnosis of SARS-CoV-2 by  FDA under an Emergency Use Authorization (EUA). This EUA will remain  in effect (meaning this test can be used) for the duration of the  Covid-19 declaration under Section 564(b)(1) of the Act, 21  U.S.C. section 360bbb-3(b)(1), unless the authorization is  terminated or revoked.    Respiratory Syncytial Virus by PCR NEGATIVE NEGATIVE Final    Comment: (NOTE) Fact Sheet for Patients: PinkCheek.be  Fact Sheet for Healthcare Providers: GravelBags.it  This test is not yet approved or cleared by the [place] FDA and  has been authorized for detection and/or  diagnosis of SARS-CoV-2 by  FDA under an Emergency Use Authorization (EUA). This EUA will remain  in effect (meaning this test can be used) for the duration of the  COVID-19 declaration under Section 564(b)(1) of the Act, 21 U.S.C.  section 360bbb-3(b)(1), unless the authorization is terminated or   revoked. Performed at [facility], [address], [place], [place] [address]   Blood Culture (routine x 2)     Status: None (Preliminary result)   Collection Time: 10/10/19 12:11 PM   Specimen: BLOOD  Result Value Ref Range Status   Specimen Description BLOOD LEFT ANTECUBITAL  Final   Special Requests   Final    BOTTLES DRAWN AEROBIC AND ANAEROBIC Blood Culture results may not be optimal due to an inadequate volume of blood received in culture bottles   Culture   Final    NO GROWTH 2 DAYS Performed at [facility], [address] [place], [place] [address]    Report Status PENDING  Incomplete  Blood Culture (routine x 2)     Status: None (Preliminary result)   Collection Time: 10/10/19 12:11 PM   Specimen: BLOOD  Result Value Ref Range Status   Specimen Description BLOOD BLOOD RIGHT FOREARM  Final   Special Requests   Final    BOTTLES DRAWN AEROBIC AND ANAEROBIC Blood Culture results may not be optimal due to an inadequate volume of blood received in culture bottles   Culture   Final    NO GROWTH 2 DAYS Performed at [facility], [address], [place], [place] [address]    Report Status PENDING  Incomplete  SARS Coronavirus 2 by RT PCR (hospital order, performed in [place] hospital lab) Nasopharyngeal Nasopharyngeal Swab     Status: None   Collection Time: 10/10/19 12:11 PM   Specimen: Nasopharyngeal Swab  Result Value Ref Range Status   SARS Coronavirus 2 NEGATIVE NEGATIVE Final    Comment: (NOTE) SARS-CoV-2 target nucleic acids are NOT DETECTED.  The SARS-CoV-2 RNA is generally detectable in upper and lower respiratory specimens during the acute phase of infection. The lowest concentration of SARS-CoV-2 viral copies this assay can detect is 250 copies / mL. A negative result does not preclude SARS-CoV-2 infection and should not be used as the sole basis for treatment or other patient management decisions.  A negative result may occur  with improper specimen collection / handling, submission of specimen other than nasopharyngeal swab, presence of viral mutation(s) within the areas targeted by this assay, and inadequate number of viral copies (<250 copies / mL). A negative result must be combined with clinical observations, patient history, and epidemiological information.  Fact Sheet for Patients:   StrictlyIdeas.no  Fact Sheet for Healthcare Providers: BankingDealers.co.za  This test is not yet approved or  cleared by the [place] FDA and has been authorized for detection and/or diagnosis of SARS-CoV-2 by FDA under an Emergency Use Authorization (EUA).  This EUA will remain in effect (meaning this test can be used) for the duration of the COVID-19 declaration under Section 564(b)(1) of the Act, 21 U.S.C. section 360bbb-3(b)(1), unless the authorization is terminated or revoked sooner.  Performed at [facility], [address] [place], [place] [address]       Imaging Studies   CT ABDOMEN PELVIS WO CONTRAST  Result Date: 10/10/2019 CLINICAL DATA:  Found down, weakness, right foot wound, recent falls EXAM: CT CHEST, ABDOMEN AND PELVIS WITHOUT CONTRAST TECHNIQUE: Multidetector CT imaging of the chest, abdomen  and pelvis was performed following the standard protocol without IV contrast. COMPARISON:  CT pelvis, 10/03/2019 FINDINGS: CT CHEST FINDINGS Cardiovascular: Aortic atherosclerosis. Normal heart size. Three-vessel coronary artery calcifications no pericardial effusion. Mediastinum/Nodes: No enlarged mediastinal, hilar, or axillary lymph nodes. Thyroid gland, trachea, and esophagus demonstrate no significant findings. Lungs/Pleura: There is dependent heterogeneous and bandlike consolidation of the left greater than right lungs, most conspicuous in the left lung base (series 5, image 93). No pleural effusion or pneumothorax. Musculoskeletal: No chest wall  mass or suspicious bone lesions identified. CT ABDOMEN PELVIS FINDINGS Hepatobiliary: No solid liver abnormality is seen. No gallstones, gallbladder wall thickening, or biliary dilatation. Pancreas: Unremarkable. No pancreatic ductal dilatation or surrounding inflammatory changes. Spleen: Normal in size without significant abnormality. Adrenals/Urinary Tract: Adrenal glands are unremarkable. Numerous bilateral renal calculi and/or vascular calcifications. No hydronephrosis. Bladder is unremarkable. Stomach/Bowel: Stomach is within normal limits. Appendix appears normal. No evidence of bowel wall thickening, distention, or inflammatory changes. Vascular/Lymphatic: Aortic atherosclerosis. No enlarged abdominal or pelvic lymph nodes. Reproductive: No mass or other abnormality. Other: No abdominal wall hernia or abnormality. No abdominopelvic ascites. Musculoskeletal: No acute or significant osseous findings. IMPRESSION: 1. There is dependent heterogeneous and bandlike consolidation of the left greater than right lungs, most conspicuous in the left lung base. Findings are consistent with multifocal infection or aspiration. 2. No noncontrast CT evidence of acute traumatic injury to the abdomen or pelvis. 3. Numerous bilateral renal calculi and/or vascular calcifications. No hydronephrosis. 4. Coronary artery disease.  Aortic Atherosclerosis (ICD10-I70.0). Electronically Signed   By: Eddie Candle M.D.   On: 10/10/2019 14:06   CT Head Wo Contrast  Result Date: 10/10/2019 CLINICAL DATA:  Chronic trauma. Head/C-spine injury suspected. Patient was found down since Thursday. EXAM: CT HEAD WITHOUT CONTRAST CT CERVICAL SPINE WITHOUT CONTRAST TECHNIQUE: Multidetector CT imaging of the head and cervical spine was performed following the standard protocol without intravenous contrast. Multiplanar CT image reconstructions of the cervical spine were also generated. COMPARISON:  04/20/2018 FINDINGS: CT HEAD FINDINGS Brain: There is  central and cortical atrophy. Periventricular white matter changes are consistent with small vessel disease. There is no intra or extra-axial fluid collection or mass lesion. The basilar cisterns and ventricles have a normal appearance. There is no CT evidence for acute infarction or hemorrhage. Vascular: There is atherosclerotic calcification of the internal carotid arteries. No hyperdense vessels. Skull: Normal. Negative for fracture or focal lesion. Sinuses/Orbits: There is mucosal thickening of the maxillary sinuses bilaterally. No evidence for acute sinus fracture. Mastoid air cells are normally aerated. Other: None. CT CERVICAL SPINE FINDINGS Alignment: Normal. Skull base and vertebrae: No acute fracture. No primary bone lesion or focal pathologic process. Soft tissues and spinal canal: No prevertebral fluid or swelling. No visible canal hematoma. Disc levels: Mild disc height loss and uncovertebral spurring at C5-6. Upper chest: Unremarkable. Other: Atherosclerosis of the vertebral arteries. IMPRESSION: 1. No evidence for acute intracranial abnormality. 2. Atrophy and small vessel disease. 3. Chronic sinusitis. 4. No evidence for acute cervical spine abnormality. 5. Mild degenerative changes in the mid cervical spine. Electronically Signed   By: Nolon Nations M.D.   On: 10/10/2019 14:04   CT Chest Wo Contrast  Result Date: 10/10/2019 CLINICAL DATA:  Found down, weakness, right foot wound, recent falls EXAM: CT CHEST, ABDOMEN AND PELVIS WITHOUT CONTRAST TECHNIQUE: Multidetector CT imaging of the chest, abdomen and pelvis was performed following the standard protocol without IV contrast. COMPARISON:  CT pelvis, 10/03/2019 FINDINGS: CT CHEST FINDINGS  Cardiovascular: Aortic atherosclerosis. Normal heart size. Three-vessel coronary artery calcifications no pericardial effusion. Mediastinum/Nodes: No enlarged mediastinal, hilar, or axillary lymph nodes. Thyroid gland, trachea, and esophagus demonstrate no  significant findings. Lungs/Pleura: There is dependent heterogeneous and bandlike consolidation of the left greater than right lungs, most conspicuous in the left lung base (series 5, image 93). No pleural effusion or pneumothorax. Musculoskeletal: No chest wall mass or suspicious bone lesions identified. CT ABDOMEN PELVIS FINDINGS Hepatobiliary: No solid liver abnormality is seen. No gallstones, gallbladder wall thickening, or biliary dilatation. Pancreas: Unremarkable. No pancreatic ductal dilatation or surrounding inflammatory changes. Spleen: Normal in size without significant abnormality. Adrenals/Urinary Tract: Adrenal glands are unremarkable. Numerous bilateral renal calculi and/or vascular calcifications. No hydronephrosis. Bladder is unremarkable. Stomach/Bowel: Stomach is within normal limits. Appendix appears normal. No evidence of bowel wall thickening, distention, or inflammatory changes. Vascular/Lymphatic: Aortic atherosclerosis. No enlarged abdominal or pelvic lymph nodes. Reproductive: No mass or other abnormality. Other: No abdominal wall hernia or abnormality. No abdominopelvic ascites. Musculoskeletal: No acute or significant osseous findings. IMPRESSION: 1. There is dependent heterogeneous and bandlike consolidation of the left greater than right lungs, most conspicuous in the left lung base. Findings are consistent with multifocal infection or aspiration. 2. No noncontrast CT evidence of acute traumatic injury to the abdomen or pelvis. 3. Numerous bilateral renal calculi and/or vascular calcifications. No hydronephrosis. 4. Coronary artery disease.  Aortic Atherosclerosis (ICD10-I70.0). Electronically Signed   By: Eddie Candle M.D.   On: 10/10/2019 14:06   CT Cervical Spine Wo Contrast  Result Date: 10/10/2019 CLINICAL DATA:  Chronic trauma. Head/C-spine injury suspected. Patient was found down since Thursday. EXAM: CT HEAD WITHOUT CONTRAST CT CERVICAL SPINE WITHOUT CONTRAST TECHNIQUE:  Multidetector CT imaging of the head and cervical spine was performed following the standard protocol without intravenous contrast. Multiplanar CT image reconstructions of the cervical spine were also generated. COMPARISON:  04/20/2018 FINDINGS: CT HEAD FINDINGS Brain: There is central and cortical atrophy. Periventricular white matter changes are consistent with small vessel disease. There is no intra or extra-axial fluid collection or mass lesion. The basilar cisterns and ventricles have a normal appearance. There is no CT evidence for acute infarction or hemorrhage. Vascular: There is atherosclerotic calcification of the internal carotid arteries. No hyperdense vessels. Skull: Normal. Negative for fracture or focal lesion. Sinuses/Orbits: There is mucosal thickening of the maxillary sinuses bilaterally. No evidence for acute sinus fracture. Mastoid air cells are normally aerated. Other: None. CT CERVICAL SPINE FINDINGS Alignment: Normal. Skull base and vertebrae: No acute fracture. No primary bone lesion or focal pathologic process. Soft tissues and spinal canal: No prevertebral fluid or swelling. No visible canal hematoma. Disc levels: Mild disc height loss and uncovertebral spurring at C5-6. Upper chest: Unremarkable. Other: Atherosclerosis of the vertebral arteries. IMPRESSION: 1. No evidence for acute intracranial abnormality. 2. Atrophy and small vessel disease. 3. Chronic sinusitis. 4. No evidence for acute cervical spine abnormality. 5. Mild degenerative changes in the mid cervical spine. Electronically Signed   By: Nolon Nations M.D.   On: 10/10/2019 14:04   DG Chest Port 1 View  Result Date: 10/10/2019 CLINICAL DATA:  Shortness of breath EXAM: PORTABLE CHEST 1 VIEW COMPARISON:  10/05/2019 chest x-ray 10/03/2019 FINDINGS: Lung volumes slightly diminished. Heart size accentuated by low lung volumes. Mildly globular appearance of cardiac silhouette. Hilar structures grossly normal. Developing airspace  disease in the LEFT chest with linear features in the mid chest and subtle retrocardiac densities. No acute skeletal process on limited assessment.  IMPRESSION: 1. Developing airspace disease in the LEFT chest, platelike appearance favors atelectasis. Difficult to exclude developing infection at the LEFT lung base. 2. Mildly globular appearance of the cardiac silhouette, could be seen in the setting of developing pericardial effusion. Electronically Signed   By: Zetta Bills M.D.   On: 10/10/2019 12:38     Medications   Scheduled Meds: . heparin  5,000 Units Subcutaneous Q8H  . insulin aspart  0-9 Units Subcutaneous TID WC  . insulin glargine  8 Units Subcutaneous QHS   Continuous Infusions: . sodium chloride 75 mL/hr at 10/11/19 1824  . ceFEPime (MAXIPIME) IV 2 g (10/11/19 2135)       LOS: 2 days    Time spent: 45 minutes with > 50% spent in direct patient contact and coordination of care.    Ezekiel Slocumb, DO [facility]  10/12/2019, 8:14 AM    If 7PM-7AM, please contact night-coverage. How to contact the [facility] Attending or Consulting provider [place] or covering provider during after hours [place], for this patient?    1. Check the care team in [facility] and look for a) attending/consulting TRH provider listed and b) the [facility] team listed 2. Log into [email or web address] and use [place]'s universal password to access. If you do not have the password, please contact the hospital operator. 3. Locate the [facility] provider you are looking for under [facility] and page to a number that you can be directly reached. 4. If you still have difficulty reaching the provider, please page the [facility] (Director on Call) for the Hospitalists listed on amion for assistance.

## 2019-10-12 NOTE — TOC Progression Note (Signed)
Transition of Care [facility]) - Progression Note    Patient Details  Name: [name] [name] MRN: [id number] Date of Birth: January 31, 1946  Transition of Care [facility]) CM/SW Contact  Clee Pandit, Gardiner Rhyme, LCSW Phone Number: 10/12/2019, 9:48 AM  Clinical Narrative:  Met with pt who is very upset with her son he did not call or come and see her yesterday and she is worried about what he is doing and her home, since he lives there. Called and got home phone voice mail and his cell phone voice mail and left messages on both. Pt is not feeling well today and feels partly this is due to her son. Pt did not want to discuss discharge plan due to worried over her son and her home. Pt needs to be assured son will take care of her home while she is at [facility]. This partly reason she will not agree to going to rehab.     Expected Discharge Plan: [place] Barriers to Discharge: Continued Medical Work up  Expected Discharge Plan and Services Expected Discharge Plan: [place] Choice: [place] arrangements for the past 2 months: Single Family Home                                       Social Determinants of Health (SDOH) Interventions    Readmission Risk Interventions No flowsheet data found.

## 2019-10-12 NOTE — Consult Note (Signed)
[place] Nurse wound consult note Consultation was completed by review of records, images and assistance from the bedside nurse/clinical staff.    Reason for Consult: right foot ulcer; followed by podiatry. Seen last admission on 10/02/19.  Wound type: neuropathic foot ulcer; Charcot foot. Pressure Injury POA: NA Measurement: 3.0cm x 1.5cm x 0.2cm  Wound bed: 100% clean, pink Drainage (amount, consistency, odor) unable to assess; no dressing in the photo Periwound: hyperkeratotic with color changes from the use of iodine based dressings.  Dressing procedure/placement/frequency: Silver hydrofiber every other day.  Limit weight bearing on the foot for wound healing. Follow up with podiatry and/or [place] at DC to home.   Discussed POC with patient and bedside nurse.  Re consult if needed, will not follow at this time. Thanks  Damary Doland [employer], RN,CWOCN, CNS, [place] [phone])

## 2019-10-13 LAB — CBC WITH DIFFERENTIAL/PLATELET
Abs Immature Granulocytes: 0.09 10*3/uL — ABNORMAL HIGH (ref 0.00–0.07)
Basophils Absolute: 0.1 10*3/uL (ref 0.0–0.1)
Basophils Relative: 0 %
Eosinophils Absolute: 0.1 10*3/uL (ref 0.0–0.5)
Eosinophils Relative: 1 %
HCT: 30.8 % — ABNORMAL LOW (ref 36.0–46.0)
Hemoglobin: 10.5 g/dL — ABNORMAL LOW (ref 12.0–15.0)
Immature Granulocytes: 1 %
Lymphocytes Relative: 18 %
Lymphs Abs: 2 10*3/uL (ref 0.7–4.0)
MCH: 30 pg (ref 26.0–34.0)
MCHC: 34.1 g/dL (ref 30.0–36.0)
MCV: 88 fL (ref 80.0–100.0)
Monocytes Absolute: 1 10*3/uL (ref 0.1–1.0)
Monocytes Relative: 8 %
Neutro Abs: 8.2 10*3/uL — ABNORMAL HIGH (ref 1.7–7.7)
Neutrophils Relative %: 72 %
Platelets: 240 10*3/uL (ref 150–400)
RBC: 3.5 MIL/uL — ABNORMAL LOW (ref 3.87–5.11)
RDW: 13.5 % (ref 11.5–15.5)
WBC: 11.5 10*3/uL — ABNORMAL HIGH (ref 4.0–10.5)
nRBC: 0 % (ref 0.0–0.2)

## 2019-10-13 LAB — BASIC METABOLIC PANEL
Anion gap: 6 (ref 5–15)
BUN: 32 mg/dL — ABNORMAL HIGH (ref 8–23)
CO2: 17 mmol/L — ABNORMAL LOW (ref 22–32)
Calcium: 8.7 mg/dL — ABNORMAL LOW (ref 8.9–10.3)
Chloride: 114 mmol/L — ABNORMAL HIGH (ref 98–111)
Creatinine, Ser: 1.64 mg/dL — ABNORMAL HIGH (ref 0.44–1.00)
GFR calc Af Amer: 36 mL/min — ABNORMAL LOW (ref >60)
GFR calc non Af Amer: 31 mL/min — ABNORMAL LOW (ref >60)
Glucose, Bld: 114 mg/dL — ABNORMAL HIGH (ref 70–99)
Potassium: 4.6 mmol/L (ref 3.5–5.1)
Sodium: 137 mmol/L (ref 135–145)

## 2019-10-13 LAB — GLUCOSE, CAPILLARY
Glucose-Capillary: 108 mg/dL — ABNORMAL HIGH (ref 70–99)
Glucose-Capillary: 102 mg/dL — ABNORMAL HIGH (ref 70–99)
Glucose-Capillary: 110 mg/dL — ABNORMAL HIGH (ref 70–99)
Glucose-Capillary: 101 mg/dL — ABNORMAL HIGH (ref 70–99)

## 2019-10-13 MED ORDER — LIDOCAINE 5 % EX PTCH
1.0000 | MEDICATED_PATCH | CUTANEOUS | Status: DC
  Administered 2019-10-13 – 2019-10-14 (×2): 1 via TRANSDERMAL
  Filled 2019-10-13 (×2): qty 1

## 2019-10-13 MED ORDER — ACETAMINOPHEN 650 MG RE SUPP: 650.0000 mg | Freq: Three times a day (TID) | RECTAL | Status: DC

## 2019-10-13 MED ORDER — SODIUM CHLORIDE 0.9 % IV SOLN
100.0000 mg | Freq: Two times a day (BID) | INTRAVENOUS | Status: DC
  Administered 2019-10-13 – 2019-10-14 (×2): 100 mg via INTRAVENOUS
  Filled 2019-10-13 (×3): qty 100

## 2019-10-13 MED ORDER — SODIUM CHLORIDE 0.9 % IV SOLN
3.0000 g | Freq: Four times a day (QID) | INTRAVENOUS | Status: DC
  Administered 2019-10-13 – 2019-10-14 (×5): 3 g via INTRAVENOUS
  Filled 2019-10-13 (×7): qty 8

## 2019-10-13 MED ORDER — ACETAMINOPHEN 500 MG PO TABS
1000.0000 mg | ORAL_TABLET | Freq: Three times a day (TID) | ORAL | Status: DC
  Administered 2019-10-13 – 2019-10-14 (×3): 1000 mg via ORAL
  Filled 2019-10-13 (×3): qty 2

## 2019-10-13 NOTE — Evaluation (Signed)
Clinical/Bedside Swallow Evaluation Patient Details  Name: [name] [name] MRN: [id number] Date of Birth: [birth year]  Today's Date: 10/13/2019 Time: SLP Start Time (ACUTE ONLY): 1330 SLP Stop Time (ACUTE ONLY): 1400 SLP Time Calculation (min) (ACUTE ONLY): 30 min  Past Medical History:  Past Medical History:  Diagnosis Date  . CHF (congestive heart failure) ([place])   . Diabetes mellitus without complication ([place])   . Humerus fracture 04/20/2018   left  . Hyperlipidemia   . Hypertension   . Hypothyroidism   . Vertigo    several yrs ago  . Wears dentures    partial upper and lower (loose)   Past Surgical History:  Past Surgical History:  Procedure Laterality Date  . CATARACT EXTRACTION W/PHACO Left 12/01/2018   Procedure: CATARACT EXTRACTION PHACO AND INTRAOCULAR LENS PLACEMENT ([place]) LEFT DIABETIC;  Surgeon: Birder Robson, MD;  Location: [facility];  Service: Ophthalmology;  Laterality: Left;  Diabetic - insulin  . COLONOSCOPY WITH PROPOFOL N/A 07/07/2019   Procedure: COLONOSCOPY WITH PROPOFOL;  Surgeon: Robert Bellow, MD;  Location: [facility] ENDOSCOPY;  Service: Endoscopy;  Laterality: N/A;  . ESOPHAGOGASTRODUODENOSCOPY (EGD) WITH PROPOFOL N/A 07/07/2019   Procedure: ESOPHAGOGASTRODUODENOSCOPY (EGD) WITH PROPOFOL;  Surgeon: Robert Bellow, MD;  Location: [facility] ENDOSCOPY;  Service: Endoscopy;  Laterality: N/A;  . FOOT SURGERY Right    lesion excision   HPI:  Per admitting History and Physical "[name] [name] is a 74 y.o. 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  Patient was discharged from this hospital on Thursday, after admission for infection of her foot ulcer.  Therapy evaluated  patient, recommended SNF, however patient and her son declined and chose instead to go home with home health services and a wheelchair.  Son stated that he and other family would be able to provide assistance for the patient.  When seen in the ED on admission, patient reports son's been doing his best.  She has been unable to get up out of bed, but says she has tried.  Has had very little to eat or drink.  Does endorse that she has gotten choked with swallowing.  Reports fevers and chills, productive cough and shortness of breath but currently denies chest pain.  Denies abdominal complaints including nausea vomiting or diarrhea.  Denies dysuria or urinary frequency.  Does report that her right leg hurts specifically at the knee, says that she slipped out of bed but unclear exactly when this occurred.  No other acute complaints."   Assessment / Plan / Recommendation Clinical Impression  ST re-consulted for increased s/s of aspiration with PO intake during day. Bedside swallow recently completed on 10/11/2019 with recommendation of dysphagia 3 with thin liquids d/t poor dentition. During this evaluation, pt consumed 6 to 8 oz thin liquids via straw, applesauce and potato chips without any evidence of oropharyngeal dysphagia or aspiration. Pt did require moderate cues to continue consumption. Initially, she took very small sips and small bites. She stated "I just can't, I just can't, I try, I try." With further questioning, pt states that she "isn't hungry, has not appetite" and further explains that this stems from current concern with her son and her house. At this time, ST intervention is not indicated. Education provided  to pt's nurse. ST to sign-off.  SLP Visit Diagnosis: Dysphagia, oral phase (R13.11)    Aspiration Risk  Mild aspiration risk    Diet Recommendation   dysphagia 3 with thin liquids  Medication Administration: Whole meds with liquid (can be placed in puree if prefered by pt)    Other   Recommendations Oral Care Recommendations: Oral care BID   Follow up Recommendations None      Frequency and Duration   N/A         Prognosis   N/A     Swallow Study   General Date of Onset: 10/13/19 HPI: Per admitting History and Physical "[name] [name] is a 75 y.o. 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  Patient was discharged from this hospital on Thursday, after admission for infection of her foot ulcer.  Therapy evaluated patient, recommended SNF, however patient and her son declined and chose instead to go home with home health services and a wheelchair.  Son stated that he and other family would be able to provide assistance for the patient.  When seen in the ED on admission, patient reports son's been doing his best.  She has been unable to get up out of bed, but says she has tried.  Has had very little to eat or drink.  Does endorse that she has gotten choked with swallowing.  Reports fevers and chills, productive cough and shortness of breath but currently denies chest pain.  Denies abdominal complaints including nausea vomiting or diarrhea.  Denies dysuria or urinary frequency.  Does report that her right leg hurts specifically at the knee, says that she slipped out of bed but unclear exactly when this occurred.  No other acute complaints." Type of Study: Bedside Swallow Evaluation Previous Swallow Assessment: 10/11/2019 - no overt s/s of oropharyngeal dysphagia Diet Prior to this Study: Regular;Thin liquids Temperature Spikes Noted: No Respiratory Status: Room air History of Recent Intubation: No Behavior/Cognition: Alert;Cooperative;Pleasant mood Oral Cavity Assessment: Within Functional  Limits Oral Care Completed by SLP: No Oral Cavity - Dentition: Missing dentition;Poor condition Vision: Functional for self-feeding Self-Feeding Abilities: Able to feed self Patient Positioning: Upright in bed Baseline Vocal Quality: Normal Volitional Cough: Strong Volitional Swallow: Able to elicit    Oral/Motor/Sensory Function Overall Oral Motor/Sensory Function: Within functional limits   Ice Chips Ice chips: Within functional limits Presentation: Self Fed;Spoon   Thin Liquid Thin Liquid: Within functional limits Presentation: Straw;Self Fed    Nectar Thick Nectar Thick Liquid: Not tested   Honey Thick Honey Thick Liquid: Not tested   Puree Puree: Within functional limits Presentation: Self Fed;Spoon   Solid     Solid: Within functional limits (for baseline diet of dysphagia 3) Presentation: Self Fed     Yojan Paskett B. Rutherford Nail M.S., CCC-SLP, [place] Pathologist Rehabilitation Services Office [phone]  Marquist Binstock 10/13/2019,5:49 PM

## 2019-10-13 NOTE — TOC Progression Note (Signed)
Transition of Care [facility]) - Progression Note    Patient Details  Name: [name] [name] MRN: [id number] Date of Birth: [birth year]  Transition of Care [facility]) CM/SW Contact  Philippe Gang, Gardiner Rhyme, LCSW Phone Number: 10/13/2019, 9:39 AM  Clinical Narrative:  Bed offers discussed with son and pt. He has chosen [facility]. Will begin insurance auth. Pt has been vaccinated. Spoke with [facility] to accept bed. Will work on transfer to SNF.      Expected Discharge Plan: [place] Barriers to Discharge: Continued Medical Work up  Expected Discharge Plan and Services Expected Discharge Plan: [place] Choice: [place] arrangements for the past 2 months: Single Family Home                                       Social Determinants of Health (SDOH) Interventions    Readmission Risk Interventions No flowsheet data found.

## 2019-10-13 NOTE — Progress Notes (Addendum)
PROGRESS NOTE    [name] [name]   BHA:[id number]  DOB: 06/23/1945  PCP: Marguerita Merles, MD    DOA: 10/10/2019 LOS: 3   Brief Narrative    [name] [name] is a 74 y.o. 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  Therapy had recommended SNF at that time, however patient wanted to return home.  Home health services were arranged, and patient's family agreed with being full time caregivers.  Unfortunately, patient requiring far more assistance than family able to handle, as patient has been entirely unable to ambulate and requires maximum assistance with all mobility.  In the ED, patient was febrile 100.9 F, RR 22, O2 sat 92% on room air, with stable HR and BP.  Labs notable for normal CK, improved chronic anemia, improved renal function, normal lactic acid, elevated procalcitonin 1.46 (down from prior admission).  Chest xray and chest CT showed infiltrates in the lung bases, left > right.  She was treated in the ED with broad spectrum antibiotics and IV fluids.  Admitted to hospitalist service for further management of sepsis secondary to aspiration pneumonia.     Assessment & Plan   Principal Problem:   Sepsis ([place]) Active Problems:   Aspiration pneumonia ([place])   Right leg pain   Diabetic ulcer of right foot associated with type 2 diabetes mellitus, with fat layer exposed ([place])   CKD stage 4 due to type 2 diabetes mellitus (HCC)   HTN (hypertension)   Hypothyroidism (acquired)   Hyperlipidemia  Acute metabolic encephalopathy - likely due to infection.  Patient has waxing and waning periods of confusion, hallucinations (talking to people not present).  She had some similar episodes  prior admission.  No focal neurologic deficits.  Continue treating infection as below.  Monitor.  Seems improved today.  Sepsis due to bilateral aspiration pneumonia - present on admission with fever, leukocytosis and imaging consistent with aspiration PNA in both bases, worse on the left.  No endorgan dysfunction and lactic acid 1.5.  Received broad-spectrum antibiotics and fluid resuscitation in the ED. Initially treated broad-spectrum with Vanco and cefepime. --De-escalate to Unasyn --Maintenance IV fluids --SLP recommends Dysphagia 3 diet --N.p.o. except ice chips and sips with meds for now --Telemetry monitoring --Repeat pro-Cal with a.m. labs --Tylenol as needed fever --Follow-up blood and sputum cultures  Generalized Weakness - POA.  Last admission SNF recommended but patient declined and family declined.  PT/OT evaluations.  TOC consult for SNF placement.   Reposition every 2 hours.  Diabetic ulcer of right foot with recent infection (finsihing antibiotics) -present on admission was discharged on 7/1 with 7 remaining days of Augmentin and doxycycline.  Continue with IV doxycycline (due to N/V today).  WOC consulted.  Follow-up with podiatry outpatient.  Right leg pain - POA, secondary slipping out of bed prior to her previous admission.  Prior imaging was negative for fractures.  PT evaluation.    Scheduled Tylenol, lidocaine patch, additional pain control as needed.  Avoid sedating meds if possible given her delirium. No indication for orthopedic consult at this time.  Type 2 Diabetes - poorly controlled, A1c 7.3%.  Appears she takes insulin 70/30 at home.  Will cover with Lantus 8 units at bedtime and sliding  scale NovoLog.  CKD stage 4 -creatinine on admission 2.02, improved from 2.27 on 7/1.  This is at or better than baseline which was 2.3 as of March 2021.  Monitor renal function daily.  Avoid nephrotoxins and hypotension.  Renally dose meds as indicated.  Cr improved to  1.8.  Hypertension -continue home amlodipine, Coreg.    Hypothyroidism - continue home levothyroxine. (Pending med history)  Hyperlipidemia -continue Lipitor. (Pending med history)  Patient BMI: Body mass index is 33.53 kg/m.   DVT prophylaxis: heparin injection 5,000 Units Start: 10/10/19 1500   Diet:  Diet Orders (From admission, onward)    Start     Ordered   10/11/19 1002  DIET DYS 3 Room service appropriate? Yes; Fluid consistency: Thin  Diet effective now       Comments: Finely chopped meats. Please send tray now  Question Answer Comment  Room service appropriate? Yes   Fluid consistency: Thin      10/11/19 1002            Code Status: Full Code    Subjective 10/13/19    Patient seen at bedside this morning.  She had just been drinking some water and got nauseous and vomited.  She reports feeling terrible.  Says she is not having hallucinations this morning.  Denies fevers or chills, cough, chest pain, shortness of breath.  No acute events reported.   Disposition Plan & Communication   Status is: Inpatient  Remains inpatient appropriate because:IV treatments appropriate due to intensity of illness or inability to take PO   Dispo: The patient is from: Home              Anticipated d/c is to: SNF              Anticipated d/c date is: 2 days              Patient currently is not medically stable to d/c.   Family Communication: None at bedside at bedside, will attempt to call son  Consults, Procedures, Significant Events   Consultants:   none  Procedures:   none  Antimicrobials:   Vancomycin 7/4 >> 7/5  Cefepime 7/4 >> 7/7  Unasyn 7/7>>  IV doxycycline 7/7 >>   Objective   Vitals:   10/13/19 0005 10/13/19 0603 10/13/19 0730 10/13/19 1230  BP: (!) 160/77 (!) 160/74 (!) 138/55 (!) 145/63  Pulse: 72 64 66 66  Resp: 17 16 18 17   Temp: 98.2 F (36.8 C) 98.6 F (37 C) 98.5 F (36.9 C) 99 F (37.2 C)  TempSrc:  Oral Oral Oral  SpO2:  95% 96% 96% 96%  Weight:      Height:       No intake or output data in the 24 hours ending 10/13/19 1826 Filed Weights   10/10/19 1216  Weight: 97.1 kg    Physical Exam:  General exam: awake, alert, no acute distress, obese HEENT: Moist mucus membranes, hearing grossly normal  Respiratory system: Decreased breath sounds , no wheezes, rales or rhonchi, normal respiratory effort. Cardiovascular system: normal S1/S2, RRR, no pedal edema.   Gastrointestinal system: soft, NT, ND, +bowel sounds. Central nervous system: A&O x3. no gross focal neurologic deficits, normal speech Extremities: moves all, no cyanosis, normal tone, right heel ulcer image below Skin: dry, intact, normal temperature Psychiatry: Normal mood, congruent affect      Labs   Data Reviewed: I have personally reviewed following labs and imaging studies  CBC: Recent Labs  Lab 10/07/19 0403 10/10/19 1211 10/11/19 0617 10/12/19 0529 10/13/19 0512  WBC 12.0* 13.0* 11.4* 12.1* 11.5*  NEUTROABS 8.5* 9.1*  --  8.3* 8.2*  HGB 9.4* 11.2* 10.1* 9.7* 10.5*  HCT 28.9* 32.8* 30.7* 29.3* 30.8*  MCV 91.5 87.7 91.1 89.6 88.0  PLT 189 249 244 239 354   Basic Metabolic Panel: Recent Labs  Lab 10/07/19 0403 10/10/19 1211 10/11/19 0617 10/12/19 0529 10/13/19 0512  NA 138 138 141 141 137  K 4.2 4.9 4.3 4.6 4.6  CL 112* 111 118* 116* 114*  CO2 18* 18* 17* 16* 17*  GLUCOSE 148* 203* 137* 124* 114*  BUN 42* 43* 42* 36* 32*  CREATININE 2.27* 2.02* 1.88* 1.80* 1.64*  CALCIUM 8.3* 8.5* 8.4* 8.7* 8.7*  MG  --   --   --  2.1  --    GFR: Estimated Creatinine Clearance: 36.6 mL/min (A) (by C-G formula based on SCr of 1.64 mg/dL (H)). Liver Function Tests: Recent Labs  Lab 10/10/19 1211  AST 85*  ALT 73*  ALKPHOS 95  BILITOT 0.7  PROT 8.4*  ALBUMIN 3.0*   No results for input(s): LIPASE, AMYLASE in the last 168 hours. No results for input(s): AMMONIA in the last 168 hours. Coagulation Profile: Recent Labs   Lab 10/10/19 1211 10/11/19 0617  INR 1.2 1.2   Cardiac Enzymes: Recent Labs  Lab 10/10/19 1211  CKTOTAL 110   BNP (last 3 results) No results for input(s): PROBNP in the last 8760 hours. HbA1C: No results for input(s): HGBA1C in the last 72 hours. CBG: Recent Labs  Lab 10/12/19 1620 10/12/19 2104 10/13/19 0730 10/13/19 1232 10/13/19 1634  GLUCAP 143* 98 108* 102* 110*   Lipid Profile: No results for input(s): CHOL, HDL, LDLCALC, TRIG, CHOLHDL, LDLDIRECT in the last 72 hours. Thyroid Function Tests: No results for input(s): TSH, T4TOTAL, FREET4, T3FREE, THYROIDAB in the last 72 hours. Anemia Panel: No results for input(s): VITAMINB12, FOLATE, FERRITIN, TIBC, IRON, RETICCTPCT in the last 72 hours. Sepsis Labs: Recent Labs  Lab 10/10/19 1211 10/11/19 0617 10/12/19 0529  PROCALCITON 1.46 1.24 0.92  LATICACIDVEN 1.5  --   --     Recent Results (from the past 240 hour(s))  Blood culture (routine x 2)     Status: None   Collection Time: 10/03/19  8:13 PM   Specimen: BLOOD  Result Value Ref Range Status   Specimen Description BLOOD LEFT HAND  Final   Special Requests   Final    BOTTLES DRAWN AEROBIC AND ANAEROBIC Blood Culture results may not be optimal due to an inadequate volume of blood received in culture bottles   Culture   Final    NO GROWTH 5 DAYS Performed at [facility], [address], [place], [place] [address]    Report Status 10/08/2019 FINAL  Final  Blood culture (routine x 2)     Status: None   Collection Time: 10/03/19  8:13 PM   Specimen: BLOOD  Result Value Ref Range Status   Specimen Description BLOOD LEFT [facility]  Final   Special Requests   Final    BOTTLES DRAWN AEROBIC AND ANAEROBIC Blood Culture adequate volume   Culture   Final    NO GROWTH 5 DAYS Performed at [facility], [address], [place], [place] [address]    Report Status 10/08/2019 FINAL  Final  Urine culture     Status: None   Collection Time: 10/03/19  10:18 PM   Specimen: Urine, Random  Result Value Ref Range Status   Specimen Description   Final    URINE, RANDOM Performed at [facility], [address], [place], [place] [address]    Special Requests   Final    NONE Performed at [facility], [address], [place], [place] [address]    Culture   Final    NO GROWTH Performed at [facility], [place] [address], [place], [place] [address]    Report Status 10/05/2019 FINAL  Final  Resp Panel by RT PCR (RSV, Flu A&B, Covid) - Nasopharyngeal Swab     Status: None   Collection Time: 10/04/19 12:47 AM   Specimen: Nasopharyngeal Swab  Result Value Ref Range Status   SARS Coronavirus 2 by RT PCR NEGATIVE NEGATIVE Final    Comment: (NOTE) SARS-CoV-2 target nucleic acids are NOT DETECTED.  The SARS-CoV-2 RNA is generally detectable in upper respiratoy specimens during the acute phase of infection. The lowest concentration of SARS-CoV-2 viral copies this assay can detect is 131 copies/mL. A negative result does not preclude SARS-Cov-2 infection and should not be used as the sole basis for treatment or other patient management decisions. A negative result may occur with  improper specimen collection/handling, submission of specimen other than nasopharyngeal swab, presence of viral mutation(s) within the areas targeted by this assay, and inadequate number of viral copies (<131 copies/mL). A negative result must be combined with clinical observations, patient history, and epidemiological information. The expected result is Negative.  Fact Sheet for Patients:  PinkCheek.be  Fact Sheet for Healthcare Providers:  GravelBags.it  This test is no t yet approved or cleared by the [place] FDA and  has been authorized for detection and/or diagnosis of SARS-CoV-2 by FDA under an Emergency Use Authorization (EUA). This EUA will remain  in effect (meaning  this test can be used) for the duration of the COVID-19 declaration under Section 564(b)(1) of the Act, 21 U.S.C. section 360bbb-3(b)(1), unless the authorization is terminated or revoked sooner.     Influenza A by PCR NEGATIVE NEGATIVE Final   Influenza B by PCR NEGATIVE NEGATIVE Final    Comment: (NOTE) The Xpert Xpress SARS-CoV-2/FLU/RSV assay is intended as an aid in  the diagnosis of influenza from Nasopharyngeal swab specimens and  should not be used as a sole basis for treatment. Nasal washings and  aspirates are unacceptable for Xpert Xpress SARS-CoV-2/FLU/RSV  testing.  Fact Sheet for Patients: PinkCheek.be  Fact Sheet for Healthcare Providers: GravelBags.it  This test is not yet approved or cleared by the [place] FDA and  has been authorized for detection and/or diagnosis of SARS-CoV-2 by  FDA under an Emergency Use Authorization (EUA). This EUA will remain  in effect (meaning this test can be used) for the duration of the  Covid-19 declaration under Section 564(b)(1) of the Act, 21  U.S.C. section 360bbb-3(b)(1), unless the authorization is  terminated or revoked.    Respiratory Syncytial Virus by PCR NEGATIVE NEGATIVE Final    Comment: (NOTE) Fact Sheet for Patients: PinkCheek.be  Fact Sheet for Healthcare Providers: GravelBags.it  This test is not yet approved or cleared by the [place] FDA and  has been authorized for detection and/or diagnosis of SARS-CoV-2 by  FDA under an Emergency Use Authorization (EUA). This EUA will remain  in effect (meaning this test can be used) for the duration of the  COVID-19 declaration under Section 564(b)(1) of the Act, 21 U.S.C.  section 360bbb-3(b)(1), unless the authorization is  terminated or  revoked. Performed at [facility], [address], [place], [place] [address]   Blood Culture  (routine x 2)     Status: None (Preliminary result)   Collection Time: 10/10/19 12:11 PM   Specimen: BLOOD  Result Value Ref Range Status   Specimen Description BLOOD LEFT ANTECUBITAL  Final   Special Requests   Final    BOTTLES DRAWN AEROBIC AND ANAEROBIC Blood Culture results may not be optimal due to an inadequate volume of blood received in culture bottles   Culture   Final    NO GROWTH 3 DAYS Performed at [facility], [address] [place], [place] [address]    Report Status PENDING  Incomplete  Blood Culture (routine x 2)     Status: None (Preliminary result)   Collection Time: 10/10/19 12:11 PM   Specimen: BLOOD  Result Value Ref Range Status   Specimen Description BLOOD BLOOD RIGHT FOREARM  Final   Special Requests   Final    BOTTLES DRAWN AEROBIC AND ANAEROBIC Blood Culture results may not be optimal due to an inadequate volume of blood received in culture bottles   Culture   Final    NO GROWTH 3 DAYS Performed at [facility], [address] [place] [employer] [place], [place] [address]    Report Status PENDING  Incomplete  SARS Coronavirus 2 by RT PCR (hospital order, performed in [place] hospital lab) Nasopharyngeal Nasopharyngeal Swab     Status: None   Collection Time: 10/10/19 12:11 PM   Specimen: Nasopharyngeal Swab  Result Value Ref Range Status   SARS Coronavirus 2 NEGATIVE NEGATIVE Final    Comment: (NOTE) SARS-CoV-2 target nucleic acids are NOT DETECTED.  The SARS-CoV-2 RNA is generally detectable in upper and lower respiratory specimens during the acute phase of infection. The lowest concentration of SARS-CoV-2 viral copies this assay can detect is 250 copies / mL. A negative result does not preclude SARS-CoV-2 infection and should not be used as the sole basis for treatment or other patient management decisions.  A negative result may occur with improper specimen collection / handling, submission of specimen other than nasopharyngeal swab,  presence of viral mutation(s) within the areas targeted by this assay, and inadequate number of viral copies (<250 copies / mL). A negative result must be combined with clinical observations, patient history, and epidemiological information.  Fact Sheet for Patients:   StrictlyIdeas.no  Fact Sheet for Healthcare Providers: BankingDealers.co.za  This test is not yet approved or  cleared by the [place] FDA and has been authorized for detection and/or diagnosis of SARS-CoV-2 by FDA under an Emergency Use Authorization (EUA).  This EUA will remain in effect (meaning this test can be used) for the duration of the COVID-19 declaration under Section 564(b)(1) of the Act, 21 U.S.C. section 360bbb-3(b)(1), unless the authorization is terminated or revoked sooner.  Performed at [facility], [address], [place], [place] [address]       Imaging Studies   No results found.   Medications   Scheduled Meds: . acetaminophen  1,000 mg Oral Q8H   Or  . acetaminophen  650 mg Rectal Q8H  . amLODipine  10 mg Oral Daily  . aspirin EC  81 mg Oral Daily  . atorvastatin  40 mg Oral Daily  . carvedilol  25 mg Oral BID  . donepezil  10 mg Oral Daily  . heparin  5,000 Units Subcutaneous Q8H  . insulin aspart  0-9 Units  Subcutaneous TID WC  . insulin glargine  8 Units Subcutaneous QHS  . levothyroxine  50 mcg Oral Daily  . lidocaine  1 patch Transdermal Q24H   Continuous Infusions: . sodium chloride 75 mL/hr at 10/11/19 1824  . ampicillin-sulbactam (UNASYN) IV 3 g (10/13/19 1304)  . doxycycline (VIBRAMYCIN) IV 100 mg (10/13/19 1744)       LOS: 3 days    Time spent: 30 minutes    Ezekiel Slocumb, DO [facility]  10/13/2019, 6:26 PM    If 7PM-7AM, please contact night-coverage. How to contact the [facility] Attending or Consulting provider [place] or covering provider during after hours [place], for this patient?     1. Check the care team in [facility] and look for a) attending/consulting TRH provider listed and b) the [facility] team listed 2. Log into [email or web address] and use [place]'s universal password to access. If you do not have the password, please contact the hospital operator. 3. Locate the [facility] provider you are looking for under [facility] and page to a number that you can be directly reached. 4. If you still have difficulty reaching the provider, please page the [facility] (Director on Call) for the Hospitalists listed on amion for assistance.

## 2019-10-13 NOTE — Care Management Important Message (Signed)
Important Message  Patient Details  Name: [name] [name] MRN: [id number] Date of Birth: Nov 23, 1945   Medicare Important Message Given:  N/A - LOS <3 / Initial given by admissions     [name] [name] 10/13/2019, 10:39 AM

## 2019-10-13 NOTE — Progress Notes (Signed)
PT Cancellation Note  Patient Details Name: [name] [name] MRN: [id number] DOB: [birth year]   Cancelled Treatment:    Reason Eval/Treat Not Completed:  (Consult received and chart reviewed.  Breakfast tray arrived just after therapist entered room; patient requesting therapist re-attempt at later time.  Will continue efforts at later time/date as medically appropriate and available.)   Soraya Paquette H. Owens Shark, PT, DPT, NCS 10/13/19, 9:26 AM [phone]

## 2019-10-13 NOTE — NC FL2 (Signed)
[place] LEVEL OF CARE SCREENING TOOL     IDENTIFICATION  Patient Name: [name] [name] Birthdate: 1945/11/18 Sex: female Admission Date (Current Location): 10/10/2019  [place] and [place] Number:  Selena Lesser [phone] [place] and Address:  [facility], [address], [place], [place] [address]      Provider Number: [id number]  Attending Physician Name and Address:  Ezekiel Slocumb, DO  Relative Name and Phone Number:  Geanie Berlin [phone]    Current Level of Care: Hospital Recommended Level of Care: [place] Prior Approval Number:    Date Approved/Denied:   PASRR Number: [id number]  Discharge Plan: SNF    Current Diagnoses: Patient Active Problem List   Diagnosis Date Noted  . Aspiration pneumonia ([place]) 10/10/2019  . Hyperkalemia   . Rib pain on left side   . Leukocytosis 10/04/2019  . Fall at home, initial encounter 10/04/2019  . Cellulitis of right leg 10/04/2019  . CKD stage 4 due to type 2 diabetes mellitus ([place]) 10/04/2019  . Acute kidney injury superimposed on CKD ([place])   . RUQ pain   . Hyperlipidemia   . Right leg pain 10/03/2019  . Diabetic ulcer of right foot associated with type 2 diabetes mellitus, with fat layer exposed ([place]) 10/03/2019  . Cellulitis of right foot 10/03/2019  . HTN (hypertension) 10/03/2019  . Type 2 diabetes mellitus with other specified complication ([place]) 62/83/1517  . Hypothyroidism (acquired) 10/03/2019  . UTI (urinary tract infection) 10/03/2019  . Sepsis ([place]) 04/25/2018    Orientation RESPIRATION BLADDER Height & Weight     Self, Place  Normal External catheter Weight: 214 lb 1.1 oz (97.1 kg) Height:  5\' 7"  (170.2 cm)  BEHAVIORAL SYMPTOMS/MOOD NEUROLOGICAL BOWEL NUTRITION STATUS      Continent Diet (Dys 3 thin liquids)  AMBULATORY STATUS COMMUNICATION OF NEEDS Skin   Extensive Assist Verbally Normal, Other (Comment) (WOC consult of foot)                        Personal Care Assistance Level of Assistance  Bathing, Dressing Bathing Assistance: Limited assistance   Dressing Assistance: Limited assistance     Functional Limitations Info             SPECIAL CARE FACTORS FREQUENCY  PT (By licensed PT), OT (By licensed OT)     PT Frequency: 5x week OT Frequency: 5x week            Contractures Contractures Info: Not present    Additional Factors Info  Code Status, Allergies Code Status Info: Full Code Allergies Info: NKDA           Current Medications (10/13/2019):  This is the current hospital active medication list Current Facility-Administered Medications  Medication Dose Route Frequency Provider Last Rate Last Admin  . 0.9 %  sodium chloride infusion   Intravenous Continuous Ezekiel Slocumb, DO 75 mL/hr at 10/11/19 1824 New Bag at 10/11/19 1824  . acetaminophen (TYLENOL) tablet 650 mg  650 mg Oral Q6H PRN Nicole Kindred A, DO   650 mg at 10/13/19 6160   Or  . acetaminophen (TYLENOL) suppository 650 mg  650 mg Rectal Q6H PRN Nicole Kindred A, DO      . amLODipine (NORVASC) tablet 10 mg  10 mg Oral Daily Nicole Kindred A, DO   10 mg at 10/13/19 7371  . aspirin EC tablet 81 mg  81 mg Oral Daily Nicole Kindred A, DO  81 mg at 10/13/19 0833  . atorvastatin (LIPITOR) tablet 40 mg  40 mg Oral Daily Nicole Kindred A, DO   40 mg at 10/13/19 8841  . bisacodyl (DULCOLAX) EC tablet 5 mg  5 mg Oral Daily PRN Nicole Kindred A, DO      . carvedilol (COREG) tablet 25 mg  25 mg Oral BID Nicole Kindred A, DO   25 mg at 10/13/19 6606  . ceFEPIme (MAXIPIME) 2 g in sodium chloride 0.9 % 100 mL IVPB  2 g Intravenous Q12H Hallaji, Sheema M, RPH 200 mL/hr at 10/13/19 0834 2 g at 10/13/19 0834  . donepezil (ARICEPT) tablet 10 mg  10 mg Oral Daily Nicole Kindred A, DO   10 mg at 10/13/19 3016  . heparin injection 5,000 Units  5,000 Units Subcutaneous Q8H Nicole Kindred A, DO   5,000 Units at 10/13/19 0548  .  HYDROcodone-acetaminophen (NORCO/VICODIN) 5-325 MG per tablet 1-2 tablet  1-2 tablet Oral Q4H PRN Nicole Kindred A, DO      . insulin aspart (novoLOG) injection 0-9 Units  0-9 Units Subcutaneous TID WC Nicole Kindred A, DO   1 Units at 10/11/19 1700  . insulin glargine (LANTUS) injection 8 Units  8 Units Subcutaneous QHS Nicole Kindred A, DO   8 Units at 10/12/19 2125  . levothyroxine (SYNTHROID) tablet 50 mcg  50 mcg Oral Daily Lu Duffel, [place]   50 mcg at 10/13/19 0548  . ondansetron (ZOFRAN) tablet 4 mg  4 mg Oral Q6H PRN Nicole Kindred A, DO       Or  . ondansetron (ZOFRAN) injection 4 mg  4 mg Intravenous Q6H PRN Nicole Kindred A, DO   4 mg at 10/11/19 1301  . polyethylene glycol (MIRALAX / GLYCOLAX) packet 17 g  17 g Oral Daily PRN Ezekiel Slocumb, DO         Discharge Medications: Please see discharge summary for a list of discharge medications.  Relevant Imaging Results:  Relevant Lab Results:   Additional Information SSN: [id number]  Caryle Helgeson, Gardiner Rhyme, LCSW

## 2019-10-13 NOTE — Evaluation (Signed)
Occupational Therapy Evaluation Patient Details Name: [name] [name] MRN: [id number] DOB: 1945/05/23 Today's Date: 10/13/2019    History of Present Illness 74 y/o female admitted to hospitalist service for further management of sepsis secondary to multifocal versus aspiration pneumonia. Recently d/c from [facility] 7/1 s/p mechanical fall resulting in right hip pain. 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   Clinical Impression   Pt presents this afternoon awake and mildly lethargic/fatigued, with son present. She agrees to evaluation saying, "I will try...and do my best." Pt lives with son and he reports that at baseline, she only requires occasional assist for ADL, ambulates with SPC and has a PCA 3x/week to assist with showering. Since recent hospitalization (last Sunday), she has been unable to ambulate and has required Max A for all ADL. Her son has been unable to complete her pericare and hygiene due to pt weakness and body habitus.   Pt is alert and oriented to herself and follows commands; disoriented to month/year and son says she has been generally confused since last week. Pt is able to lift all four extremities on command, with c/o R sided pain s/p fall. Pt's son reports this is an improvement, as she has only been able to lift L arm over the past week. At present, pt requires Max A for side rolling to attempt pericare, Mod A for UBD and is unsafe for further transfers or mobility. Facilitated education with pt and son on repositioning to reduce pressure sores and safe discharge planning. Pt and son express desire for pt to continue to maximize independence. Pt will benefit from skilled acute OT services to address functional deficits limiting ADL participation in order to reduce caregiver burden. Recommend SNF upon discharge to maximize pt outcomes.    Follow Up Recommendations  SNF    Equipment  Recommendations  Other (comment) (TBD at next venue)    Recommendations for Other Services       Precautions / Restrictions Precautions Precautions: Fall Other Brace: Darco shoe (not here on evaluation) Restrictions Weight Bearing Restrictions: No Other Position/Activity Restrictions: Per OT eval last week: " "Minimize AMB to promote healing" per podiatry "      Mobility Bed Mobility Overal bed mobility: Needs Assistance Bed Mobility: Rolling Rolling: Max assist     Sit to supine: Max assist   General bed mobility comments: Max A provided to attempt right side rolling, deferred due to right sided pain; Max A +2 for boosting up in bed  Transfers                 General transfer comment: not safe to attempt this date, pt fatigue    Balance                                           ADL either performed or assessed with clinical judgement   ADL Overall ADL's : Needs assistance/impaired                                       General ADL Comments: At least Max A for rolling to either side for pericare at bed level. Unable to incorporate core strength to sit up in bed this date, Mod A for UB ADL.  Unable to participate in further ADL/functional mobility this session 2/2 pt fatigue.     Vision         Perception     Praxis      Pertinent Vitals/Pain Pain Assessment: Faces Faces Pain Scale: Hurts a little bit Pain Location: RUE and shoulder Pain Descriptors / Indicators: Aching;Grimacing;Guarding Pain Intervention(s): Limited activity within patient's tolerance;Monitored during session;Repositioned     Hand Dominance Left   Extremity/Trunk Assessment Upper Extremity Assessment Upper Extremity Assessment: RUE deficits/detail;Generalized weakness RUE Deficits / Details: unable to tolerate full shoulder ROM or MMT due to c/o pain, 3/5 elbow flex/ext RUE: Unable to fully assess due to pain LUE Deficits / Details: ROM WFL,  generalized weakness, MMT grossly 4-/5   Lower Extremity Assessment Lower Extremity Assessment:  (not assessed, ulcer uncovered on right foot)       Communication Communication Communication: No difficulties   Cognition Arousal/Alertness: Awake/alert Behavior During Therapy: WFL for tasks assessed/performed Overall Cognitive Status: Impaired/Different from baseline                                 General Comments: Pt is alert and oriented to name, DOB and place. She states the month is "2002" on multiple attempts. Per pt son, she has been generally more confused since last week than at her baseline.   General Comments       Exercises Other Exercises Other Exercises: Facilitated education with pt and son re: role of acute care OT, maximizing pt participation in ADL, discharge planning and safety Other Exercises: Instructed pt and caregiver in repositioning to reduce risk of pressure sores or skin breakdown   Shoulder Instructions      Home Living Family/patient expects to be discharged to:: Private residence Living Arrangements: Children (son) Available Help at Discharge: Family;Personal care attendant (Son works PT; pt has Engineer, production in home MWF 9-11:30 for ADL assist.) Type of Home: House Home Access: Stairs to enter Technical brewer of Steps: 4 Entrance Stairs-Rails: Left;Right Home Layout: One level     Bathroom Shower/Tub: Tub only         Home Equipment: [place] - single point;Shower seat   Additional Comments: Denies any falls history in the last 6 months, but fell in Jan 2020 c Left humeral fracture.      Prior Functioning/Environment Level of Independence: Needs assistance  Gait / Transfers Assistance Needed: following d/c on 7/1, unable to ambulate without Max A. Only 1 fall in the past 6 months, resulting in recent hospitalization. ADL's / Homemaking Assistance Needed: following d/c on 7/1, pt has required Max A for all ADL from son. He has been  unable to complete pericare and hygiene, resulting in pt sitting in urine/feces.   Comments: Prior to recent hospitalization, son reports pt only required occasional assist for ADL and she used [facility] for household ambulation. He reports that she has had a significant decline since discharged from hospital; was only moving her right arm. She has had the R foot ulcer for ~1 year.        OT Problem List: Decreased strength;Decreased range of motion;Decreased activity tolerance;Impaired balance (sitting and/or standing);Decreased safety awareness;Decreased knowledge of use of DME or AE;Obesity;Pain;Impaired UE functional use;Decreased cognition      OT Treatment/Interventions: Self-care/ADL training;Therapeutic exercise;DME and/or AE instruction;Therapeutic activities;Balance training;Patient/family education;Energy conservation    OT Goals(Current goals can be found in the care plan section) Acute Rehab OT Goals Patient Stated  Goal: To regain some independence OT Goal Formulation: With patient/family Time For Goal Achievement: 10/27/19 Potential to Achieve Goals: Good ADL Goals Pt Will Perform Grooming: bed level;with set-up (x10 min) Pt Will Perform Upper Body Dressing: with set-up;bed level (fewer than 3 verbal cues) Additional ADL Goal #1: Pt will complete rolling to side in bed with Mod A to maximize pt independence during toileting and pericare  OT Frequency: Min 2X/week   Barriers to D/C:            Co-evaluation              AM-PAC OT "6 Clicks" Daily Activity     Outcome Measure Help from another person eating meals?: A Little Help from another person taking care of personal grooming?: A Little Help from another person toileting, which includes using toliet, bedpan, or urinal?: Total Help from another person bathing (including washing, rinsing, drying)?: A Lot Help from another person to put on and taking off regular upper body clothing?: A Little Help from another person  to put on and taking off regular lower body clothing?: A Lot 6 Click Score: 14   End of Session Nurse Communication: Mobility status;Other (comment) (Pt needs bed sheet change)  Activity Tolerance: Patient limited by fatigue Patient left: in bed;with call bell/phone within reach;with bed alarm set  OT Visit Diagnosis: Other abnormalities of gait and mobility (R26.89);History of falling (Z91.81);Muscle weakness (generalized) (M62.81);Other symptoms and signs involving cognitive function Pain - Right/Left: Right Pain - part of body: Shoulder;Arm;Hand;Leg                Time: 1355-1420 OT Time Calculation (min): 25 min Charges:  OT General Charges $OT Visit: 1 Visit OT Evaluation $OT Eval Moderate Complexity: 1 Mod OT Treatments $Therapeutic Activity: 8-22 mins  Jerilynn Birkenhead, OTS 10/13/19, 3:00 PM

## 2019-10-14 DIAGNOSIS — E1122 Type 2 diabetes mellitus with diabetic chronic kidney disease: Secondary | ICD-10-CM

## 2019-10-14 DIAGNOSIS — N184 Chronic kidney disease, stage 4 (severe): Secondary | ICD-10-CM

## 2019-10-14 DIAGNOSIS — L97412 Non-pressure chronic ulcer of right heel and midfoot with fat layer exposed: Secondary | ICD-10-CM

## 2019-10-14 DIAGNOSIS — A419 Sepsis, unspecified organism: Principal | ICD-10-CM

## 2019-10-14 DIAGNOSIS — J690 Pneumonitis due to inhalation of food and vomit: Secondary | ICD-10-CM

## 2019-10-14 DIAGNOSIS — E11621 Type 2 diabetes mellitus with foot ulcer: Secondary | ICD-10-CM

## 2019-10-14 LAB — CBC WITH DIFFERENTIAL/PLATELET
Abs Immature Granulocytes: 0.1 10*3/uL — ABNORMAL HIGH (ref 0.00–0.07)
Basophils Absolute: 0.1 10*3/uL (ref 0.0–0.1)
Basophils Relative: 1 %
Eosinophils Absolute: 0.1 10*3/uL (ref 0.0–0.5)
Eosinophils Relative: 1 %
HCT: 32.8 % — ABNORMAL LOW (ref 36.0–46.0)
Hemoglobin: 11.1 g/dL — ABNORMAL LOW (ref 12.0–15.0)
Immature Granulocytes: 1 %
Lymphocytes Relative: 20 %
Lymphs Abs: 2.2 10*3/uL (ref 0.7–4.0)
MCH: 29.4 pg (ref 26.0–34.0)
MCHC: 33.8 g/dL (ref 30.0–36.0)
MCV: 87 fL (ref 80.0–100.0)
Monocytes Absolute: 0.9 10*3/uL (ref 0.1–1.0)
Monocytes Relative: 8 %
Neutro Abs: 7.4 10*3/uL (ref 1.7–7.7)
Neutrophils Relative %: 69 %
Platelets: 247 10*3/uL (ref 150–400)
RBC: 3.77 MIL/uL — ABNORMAL LOW (ref 3.87–5.11)
RDW: 13.6 % (ref 11.5–15.5)
WBC: 10.7 10*3/uL — ABNORMAL HIGH (ref 4.0–10.5)
nRBC: 0 % (ref 0.0–0.2)

## 2019-10-14 LAB — GLUCOSE, CAPILLARY
Glucose-Capillary: 70 mg/dL (ref 70–99)
Glucose-Capillary: 77 mg/dL (ref 70–99)

## 2019-10-14 LAB — BASIC METABOLIC PANEL
Anion gap: 10 (ref 5–15)
BUN: 29 mg/dL — ABNORMAL HIGH (ref 8–23)
CO2: 16 mmol/L — ABNORMAL LOW (ref 22–32)
Calcium: 8.9 mg/dL (ref 8.9–10.3)
Chloride: 111 mmol/L (ref 98–111)
Creatinine, Ser: 1.61 mg/dL — ABNORMAL HIGH (ref 0.44–1.00)
GFR calc Af Amer: 36 mL/min — ABNORMAL LOW (ref >60)
GFR calc non Af Amer: 31 mL/min — ABNORMAL LOW (ref >60)
Glucose, Bld: 82 mg/dL (ref 70–99)
Potassium: 4.2 mmol/L (ref 3.5–5.1)
Sodium: 137 mmol/L (ref 135–145)

## 2019-10-14 LAB — MAGNESIUM: Magnesium: 1.9 mg/dL (ref 1.7–2.4)

## 2019-10-14 NOTE — Evaluation (Signed)
Physical Therapy Evaluation Patient Details Name: [name] [name] MRN: [id number] DOB: 04-Aug-1945 Today's Date: 10/14/2019   History of Present Illness  74 y/o female admitted for further management of sepsis secondary to multifocal versus aspiration pneumonia. Recently d/c from [facility] 7/1 s/p mechanical fall resulting in right hip pain. 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  Clinical Impression  Pt laying in bed feeling weak, reporting some pain and generally not wanting to do a whole lot.  Ultimately PT did get her to participate but again she was very weak and struggled with all aspects of mobility.  She needed heavy assist to get to and maintain sitting.  She also needed heavy assist to get to standing, struggled with very modest effort with side stepping at EOB and she very quickly became fatigued and sat down abruptly onto the bed.  Pt unsafe to attempt return to home, will require STR to get back to safe functional level before returning home.    Follow Up Recommendations SNF    Equipment Recommendations    TBD at rehab   Recommendations for Other Services       Precautions / Restrictions Precautions Precautions: Fall Restrictions Weight Bearing Restrictions: No      Mobility  Bed Mobility Overal bed mobility: Needs Assistance Bed Mobility: Supine to Sit;Sit to Supine     Supine to sit: Max assist Sit to supine: Mod assist   General bed mobility comments: heavy assist to get to sitting, struggled to maintain upright, needed considerable assist to scoot full square to EOB  Transfers Overall transfer level: Needs assistance Equipment used: Rolling walker (2 wheeled);None Transfers: Sit to/from Stand Sit to Stand: Max assist         General transfer comment: Pt unable to rise with unassisted attempt, stuggled even with max assist  Ambulation/Gait              General Gait Details: no true ambulation, pt was able to manage a few very unsteady, labored and ultimately unsafe side shuffle steps at EOB before crashing back down to the bed   Stairs            Wheelchair Mobility    Modified Rankin (Stroke Patients Only)       Balance Overall balance assessment: Needs assistance Sitting-balance support: Bilateral upper extremity supported;Feet supported Sitting balance-Leahy Scale: Poor     Standing balance support: Bilateral upper extremity supported Standing balance-Leahy Scale: Poor                               Pertinent Vitals/Pain Faces Pain Scale: Hurts a little bit Pain Location: back, does not cite hip pain     Home Living Family/patient expects to be discharged to:: Skilled nursing facility Living Arrangements: Children Available Help at Discharge: Family;Personal care attendant Type of Home: House Home Access: Stairs to enter Entrance Stairs-Rails: Chemical engineer of Steps: 4 Home Layout: One level Home Equipment: Cane - single point;Shower seat Additional Comments: recent fall and reports none since fall in Jan 2020 c Left humeral fracture.    Prior Function Level of Independence: Needs assistance   Gait / Transfers Assistance Needed: apparently she has been completely unable to ambulate since d/c 1 week ago  ADL's / Homemaking Assistance Needed: following d/c on 7/1, pt has required Max A for all ADL from son. He has been  unable to complete pericare and hygiene, resulting in pt sitting in urine/feces.        Hand Dominance        Extremity/Trunk Assessment   Upper Extremity Assessment Upper Extremity Assessment: Generalized weakness    Lower Extremity Assessment Lower Extremity Assessment: Generalized weakness (grossly 3-/5 t/o)       Communication   Communication: No difficulties  Cognition Arousal/Alertness: Awake/alert Behavior During Therapy: WFL for tasks  assessed/performed Overall Cognitive Status: Impaired/Different from baseline                                        General Comments General comments (skin integrity, edema, etc.): Pt reports she feels weak and did not initially even agee to try standing    Exercises     Assessment/Plan    PT Assessment Patient needs continued PT services  PT Problem List Decreased strength;Decreased activity tolerance;Decreased balance;Decreased mobility;Decreased safety awareness;Decreased knowledge of precautions;Obesity       PT Treatment Interventions DME instruction;Gait training;Stair training;Functional mobility training;Therapeutic activities;Therapeutic exercise;Patient/family education    PT Goals (Current goals can be found in the Care Plan section)  Acute Rehab PT Goals Patient Stated Goal: To regain some independence PT Goal Formulation: With patient Time For Goal Achievement: 10/28/19 Potential to Achieve Goals: Fair    Frequency Min 2X/week   Barriers to discharge        Co-evaluation               AM-PAC PT "6 Clicks" Mobility  Outcome Measure Help needed turning from your back to your side while in a flat bed without using bedrails?: Total Help needed moving from lying on your back to sitting on the side of a flat bed without using bedrails?: Total Help needed moving to and from a bed to a chair (including a wheelchair)?: Total Help needed standing up from a chair using your arms (e.g., wheelchair or bedside chair)?: Total Help needed to walk in hospital room?: Total Help needed climbing 3-5 steps with a railing? : Total 6 Click Score: 6    End of Session Equipment Utilized During Treatment: Gait belt Activity Tolerance: Patient limited by pain;Patient limited by fatigue Patient left: with bed alarm set;with call bell/phone within reach Nurse Communication: Mobility status PT Visit Diagnosis: Unsteadiness on feet (R26.81);Other abnormalities  of gait and mobility (R26.89);Difficulty in walking, not elsewhere classified (R26.2)    Time: 1610-9604 PT Time Calculation (min) (ACUTE ONLY): 28 min   Charges:   PT Evaluation $PT Eval Low Complexity: 1 Low PT Treatments $Therapeutic Activity: 8-22 mins        Kreg Shropshire, DPT 10/14/2019, 11:47 AM

## 2019-10-14 NOTE — Discharge Summary (Signed)
[place] [facility] [place] NAME: [name] [name]    MR#:  [id number]  DATE OF BIRTH:  01-02-1946  DATE OF ADMISSION:  10/10/2019   ADMITTING PHYSICIAN: Ezekiel Slocumb, DO  DATE OF DISCHARGE: 10/14/2019  PRIMARY CARE PHYSICIAN: Marguerita Merles, MD   ADMISSION DIAGNOSIS:  Sepsis ([place]) [A41.9] Aspiration pneumonia, unspecified aspiration pneumonia type, unspecified laterality, unspecified part of lung ([place]) [J69.0] Sepsis, due to unspecified organism, unspecified whether acute organ dysfunction present ([place]) [A41.9] DISCHARGE DIAGNOSIS:  Principal Problem:   Sepsis ([place]) Active Problems:   Right leg pain   Diabetic ulcer of right foot associated with type 2 diabetes mellitus, with fat layer exposed ([place])   HTN (hypertension)   Hypothyroidism (acquired)   CKD stage 4 due to type 2 diabetes mellitus ([place])   Hyperlipidemia   Aspiration pneumonia ([employer])  SECONDARY DIAGNOSIS:   Past Medical History:  Diagnosis Date  . CHF (congestive heart failure) ([place])   . Diabetes mellitus without complication ([place])   . Humerus fracture 04/20/2018   left  . Hyperlipidemia   . Hypertension   . Hypothyroidism   . Vertigo    several yrs ago  . 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  Last admission she was recommended to go to SNF but she refused and went home.  Home health services were arranged but family could not take care of her and she is here to get placed at SNF.  Acute metabolic encephalopathy present on admission from infection She is a waxing and waning confusion, hallucination.  Seems at her baseline today.  Sepsis: Present on admission due to bilateral aspiration  pneumonia Remains afebrile, leukocytosis improving.  Speech therapy recommends dysphagia 3 diet, high risk for recurrent aspiration  Generalized weakness: Present on admission.  Patient declined SNF last admission but now agreeable.  Very limited mobility.  High risk for decubitus and pressure ulcer.  Recommend repositioning every 2 hours if remains in bed all the time and does not ambulate.   DISCHARGE CONDITIONS:  Fair CONSULTS OBTAINED:   DRUG ALLERGIES:  No Known Allergies DISCHARGE MEDICATIONS:   Allergies as of 10/14/2019   No Known Allergies     Medication List    STOP taking these medications   doxycycline 100 MG tablet Commonly known as: VIBRA-TABS     TAKE these medications   amLODipine 10 MG tablet Commonly known as: NORVASC Take 1 tablet by mouth daily.   amoxicillin-clavulanate 500-125 MG tablet Commonly known as: AUGMENTIN Take 1 tablet (500 mg total) by mouth every 12 (twelve) hours for 7 days.   ASPIRIN 81 PO Take by mouth daily.   atorvastatin 40 MG tablet Commonly known as: LIPITOR Take 1 tablet by mouth daily.   carvedilol 25 MG tablet Commonly known as: COREG Take 1 tablet by mouth 2 (two) times daily.   donepezil 10 MG tablet Commonly known as: ARICEPT Take 10 mg by mouth daily.   insulin aspart protamine- aspart (70-30) 100 UNIT/ML injection Commonly known as: NOVOLOG MIX 70/30 Inject 15 Units into the skin daily with breakfast.   Synthroid 50 MCG tablet Generic drug: levothyroxine Take 1 tablet by mouth daily.   Tyler Aas FlexTouch  100 UNIT/ML FlexTouch Pen Generic drug: insulin degludec Inject 80 Units into the skin at bedtime.            Discharge Care Instructions  (From admission, onward)         Start     Ordered   10/14/19 0000  Change dressing (specify)       Comments: Dressing change: Every other day   10/14/19 1058         DISCHARGE INSTRUCTIONS:  Please follow-up with podiatry and primary care.   DIET:   Diabetic diet DISCHARGE CONDITION:  Fair ACTIVITY:  Activity as tolerated OXYGEN:  Home Oxygen: No.  Oxygen Delivery: room air DISCHARGE LOCATION:  nursing home -palliative care to follow  If you experience worsening of your admission symptoms, develop shortness of breath, life threatening emergency, suicidal or homicidal thoughts you must seek medical attention immediately by calling 911 or calling your MD immediately  if symptoms less severe.  You Must read complete instructions/literature along with all the possible adverse reactions/side effects for all the Medicines you take and that have been prescribed to you. Take any new Medicines after you have completely understood and accpet all the possible adverse reactions/side effects.   Please note  You were cared for by a hospitalist during your hospital stay. If you have any questions about your discharge medications or the care you received while you were in the hospital after you are discharged, you can call the unit and asked to speak with the hospitalist on call if the hospitalist that took care of you is not available. Once you are discharged, your primary care physician will handle any further medical issues. Please note that NO REFILLS for any discharge medications will be authorized once you are discharged, as it is imperative that you return to your primary care physician (or establish a relationship with a primary care physician if you do not have one) for your aftercare needs so that they can reassess your need for medications and monitor your lab values.    On the day of Discharge:  VITAL SIGNS:  Blood pressure (!) 162/75, pulse 62, temperature 97.9 F (36.6 C), temperature source Oral, resp. rate 18, height 5\' 7"  (1.702 m), weight 97.1 kg, SpO2 98 %. PHYSICAL EXAMINATION:  GENERAL:  74 y.o.-year-old patient lying in the bed with no acute distress.  EYES: Pupils equal, round, reactive to light and accommodation. No scleral  icterus. Extraocular muscles intact.  HEENT: Head atraumatic, normocephalic. Oropharynx and nasopharynx clear.  NECK:  Supple, no jugular venous distention. No thyroid enlargement, no tenderness.  LUNGS: Normal breath sounds bilaterally, no wheezing, rales,rhonchi or crepitation. No use of accessory muscles of respiration.  CARDIOVASCULAR: S1, S2 normal. No murmurs, rubs, or gallops.  ABDOMEN: Soft, non-tender, non-distended. Bowel sounds present. No organomegaly or mass.  EXTREMITIES: No pedal edema, cyanosis, or clubbing.  NEUROLOGIC: Cranial nerves II through XII are intact. Muscle strength 5/5 in all extremities. Sensation intact. Gait not checked.  PSYCHIATRIC: The patient is alert and oriented x 3.  SKIN: No obvious rash, lesion, or ulcer.  DATA REVIEW:   CBC Recent Labs  Lab 10/14/19 0535  WBC 10.7*  HGB 11.1*  HCT 32.8*  PLT 247    Chemistries  Recent Labs  Lab 10/10/19 1211 10/11/19 0617 10/14/19 0535  NA 138   < > 137  K 4.9   < > 4.2  CL 111   < > 111  CO2 18*   < > 16*  GLUCOSE 203*   < > 82  BUN 43*   < > 29*  CREATININE 2.02*   < > 1.61*  CALCIUM 8.5*   < > 8.9  MG  --    < > 1.9  AST 85*  --   --   ALT 73*  --   --   ALKPHOS 95  --   --   BILITOT 0.7  --   --    < > = values in this interval not displayed.     Outpatient follow-up  Contact information for follow-up providers    Marguerita Merles, MD. Schedule an appointment as soon as possible for a visit in 1 week(s).   Specialty: Family Medicine Contact information: [place] [address] [phone]            Contact information for after-discharge care    [facility] .   Service: Skilled Nursing Contact information: [place] [phone]                   Management plans discussed with the patient, family and they are in agreement.  CODE STATUS: Full Code   TOTAL  TIME TAKING CARE OF THIS PATIENT: 45 minutes.    Max Sane M.D on 10/14/2019 at 11:05 AM  [facility]   CC: Primary care physician; Marguerita Merles, MD   Note: This dictation was prepared with Dragon dictation along with smaller phrase technology. Any transcriptional errors that result from this process are unintentional.

## 2019-10-14 NOTE — TOC Progression Note (Signed)
Transition of Care [facility]) - Progression Note    Patient Details  Name: [name] [name] MRN: [id number] Date of Birth: 09/23/1945  [facility] CM/SW Contact  Konnar Ben, Gardiner Rhyme, LCSW Phone Number: 10/14/2019, 8:11 AM  Clinical Narrative:   Have insurance auth for pt to go to [facility] today if medically stable. Josem Kaufmann [id number] ref [id number] 7/7-7/9 CM following Estrellita Ludwig. Have let [facility] know of authorization. Await MD rounds and contact son to make aware of plan.    Expected Discharge Plan: [place] Barriers to Discharge: Continued Medical Work up  Expected Discharge Plan and Services Expected Discharge Plan: [place] Choice: [place] arrangements for the past 2 months: Single Family Home                                       Social Determinants of Health (SDOH) Interventions    Readmission Risk Interventions No flowsheet data found.

## 2019-10-14 NOTE — TOC Transition Note (Signed)
Transition of Care [facility]) - CM/SW Discharge Note   Patient Details  Name: KIANNA BILLET MRN: [id number] Date of Birth: 1946-01-08  Transition of Care [facility]) CM/SW Contact:  Elease Hashimoto, LCSW Phone Number: 10/14/2019, 9:54 AM   Clinical Narrative:  Son aware medically stable for transfer and [place] has contacted him regarding paperwork to complete. Bedside RN to call report to [phone]. DC packet in chart. Awaiting MD to complete DC paperwork. Ref [id number] Josem Kaufmann [id number] 7/7-7/9 CM Estrellita Ludwig to follow for continued updates.     Final next level of care: Skilled Nursing Facility Barriers to Discharge: Barriers Resolved   Patient Goals and CMS Choice        Discharge Placement PASRR number recieved: 10/11/19            Patient chooses bed at: [facility] Patient to be transferred to facility by: EMS Name of family member notified: Darrick-son Patient and family notified of of transfer: 10/14/19  Discharge Plan and Services     Post Acute Care Choice: [place]                               Social Determinants of Health (SDOH) Interventions     Readmission Risk Interventions No flowsheet data found.

## 2019-10-15 LAB — CULTURE, BLOOD (ROUTINE X 2)
Culture: NO GROWTH
Culture: NO GROWTH

## 2019-10-18 DIAGNOSIS — D631 Anemia in chronic kidney disease: Secondary | ICD-10-CM | POA: Insufficient documentation

## 2019-10-18 DIAGNOSIS — I129 Hypertensive chronic kidney disease with stage 1 through stage 4 chronic kidney disease, or unspecified chronic kidney disease: Secondary | ICD-10-CM | POA: Insufficient documentation

## 2019-10-18 DIAGNOSIS — D649 Anemia, unspecified: Secondary | ICD-10-CM | POA: Insufficient documentation

## 2019-10-18 DIAGNOSIS — I509 Heart failure, unspecified: Secondary | ICD-10-CM | POA: Insufficient documentation

## 2019-11-15 ENCOUNTER — Encounter: Payer: Self-pay | Admitting: Emergency Medicine

## 2019-11-15 ENCOUNTER — Inpatient Hospital Stay
Admission: EM | Admit: 2019-11-15 | Discharge: 2019-11-19 | DRG: 683 | Disposition: A | Discharge status: Skilled Nursing Facility | Payer: Medicare Other | Attending: Internal Medicine | Admitting: Internal Medicine

## 2019-11-15 ENCOUNTER — Other Ambulatory Visit: Payer: Self-pay

## 2019-11-15 ENCOUNTER — Emergency Department: Payer: Medicare Other

## 2019-11-15 DIAGNOSIS — Z7982 Long term (current) use of aspirin: Secondary | ICD-10-CM

## 2019-11-15 DIAGNOSIS — R509 Fever, unspecified: Secondary | ICD-10-CM | POA: Diagnosis present

## 2019-11-15 DIAGNOSIS — L97519 Non-pressure chronic ulcer of other part of right foot with unspecified severity: Secondary | ICD-10-CM | POA: Diagnosis present

## 2019-11-15 DIAGNOSIS — Z961 Presence of intraocular lens: Secondary | ICD-10-CM | POA: Diagnosis present

## 2019-11-15 DIAGNOSIS — E871 Hypo-osmolality and hyponatremia: Secondary | ICD-10-CM | POA: Diagnosis present

## 2019-11-15 DIAGNOSIS — E872 Acidosis, unspecified: Secondary | ICD-10-CM

## 2019-11-15 DIAGNOSIS — Z79899 Other long term (current) drug therapy: Secondary | ICD-10-CM

## 2019-11-15 DIAGNOSIS — Z79890 Hormone replacement therapy (postmenopausal): Secondary | ICD-10-CM | POA: Diagnosis not present

## 2019-11-15 DIAGNOSIS — N183 Chronic kidney disease, stage 3 unspecified: Secondary | ICD-10-CM | POA: Diagnosis present

## 2019-11-15 DIAGNOSIS — E1122 Type 2 diabetes mellitus with diabetic chronic kidney disease: Secondary | ICD-10-CM | POA: Diagnosis present

## 2019-11-15 DIAGNOSIS — E875 Hyperkalemia: Secondary | ICD-10-CM | POA: Diagnosis present

## 2019-11-15 DIAGNOSIS — Z9842 Cataract extraction status, left eye: Secondary | ICD-10-CM | POA: Diagnosis not present

## 2019-11-15 DIAGNOSIS — R1110 Vomiting, unspecified: Secondary | ICD-10-CM

## 2019-11-15 DIAGNOSIS — I10 Essential (primary) hypertension: Secondary | ICD-10-CM | POA: Diagnosis not present

## 2019-11-15 DIAGNOSIS — E860 Dehydration: Secondary | ICD-10-CM | POA: Diagnosis present

## 2019-11-15 DIAGNOSIS — E870 Hyperosmolality and hypernatremia: Secondary | ICD-10-CM | POA: Diagnosis present

## 2019-11-15 DIAGNOSIS — Z8673 Personal history of transient ischemic attack (TIA), and cerebral infarction without residual deficits: Secondary | ICD-10-CM

## 2019-11-15 DIAGNOSIS — E1121 Type 2 diabetes mellitus with diabetic nephropathy: Secondary | ICD-10-CM | POA: Diagnosis present

## 2019-11-15 DIAGNOSIS — N1832 Chronic kidney disease, stage 3b: Secondary | ICD-10-CM | POA: Diagnosis present

## 2019-11-15 DIAGNOSIS — Z20822 Contact with and (suspected) exposure to covid-19: Secondary | ICD-10-CM | POA: Diagnosis present

## 2019-11-15 DIAGNOSIS — R5381 Other malaise: Secondary | ICD-10-CM | POA: Diagnosis present

## 2019-11-15 DIAGNOSIS — Z794 Long term (current) use of insulin: Secondary | ICD-10-CM | POA: Diagnosis not present

## 2019-11-15 DIAGNOSIS — I129 Hypertensive chronic kidney disease with stage 1 through stage 4 chronic kidney disease, or unspecified chronic kidney disease: Secondary | ICD-10-CM | POA: Diagnosis present

## 2019-11-15 DIAGNOSIS — F0150 Vascular dementia without behavioral disturbance: Secondary | ICD-10-CM | POA: Diagnosis present

## 2019-11-15 DIAGNOSIS — E785 Hyperlipidemia, unspecified: Secondary | ICD-10-CM | POA: Diagnosis present

## 2019-11-15 DIAGNOSIS — N179 Acute kidney failure, unspecified: Principal | ICD-10-CM | POA: Diagnosis present

## 2019-11-15 DIAGNOSIS — E11621 Type 2 diabetes mellitus with foot ulcer: Secondary | ICD-10-CM | POA: Diagnosis present

## 2019-11-15 DIAGNOSIS — K5900 Constipation, unspecified: Secondary | ICD-10-CM | POA: Diagnosis not present

## 2019-11-15 DIAGNOSIS — E1151 Type 2 diabetes mellitus with diabetic peripheral angiopathy without gangrene: Secondary | ICD-10-CM | POA: Diagnosis present

## 2019-11-15 DIAGNOSIS — E039 Hypothyroidism, unspecified: Secondary | ICD-10-CM | POA: Diagnosis present

## 2019-11-15 LAB — COMPREHENSIVE METABOLIC PANEL WITH GFR
ALT: 17 U/L (ref 0–44)
AST: 26 U/L (ref 15–41)
Albumin: 3.4 g/dL — ABNORMAL LOW (ref 3.5–5.0)
Alkaline Phosphatase: 83 U/L (ref 38–126)
Anion gap: 13 (ref 5–15)
BUN: 92 mg/dL — ABNORMAL HIGH (ref 8–23)
CO2: 15 mmol/L — ABNORMAL LOW (ref 22–32)
Calcium: 8.9 mg/dL (ref 8.9–10.3)
Chloride: 120 mmol/L — ABNORMAL HIGH (ref 98–111)
Creatinine, Ser: 3.6 mg/dL — ABNORMAL HIGH (ref 0.44–1.00)
GFR calc Af Amer: 14 mL/min — ABNORMAL LOW
GFR calc non Af Amer: 12 mL/min — ABNORMAL LOW
Glucose, Bld: 204 mg/dL — ABNORMAL HIGH (ref 70–99)
Potassium: 6.1 mmol/L — ABNORMAL HIGH (ref 3.5–5.1)
Sodium: 148 mmol/L — ABNORMAL HIGH (ref 135–145)
Total Bilirubin: 0.8 mg/dL (ref 0.3–1.2)
Total Protein: 8.8 g/dL — ABNORMAL HIGH (ref 6.5–8.1)

## 2019-11-15 LAB — BASIC METABOLIC PANEL
Anion gap: 15 (ref 5–15)
BUN: 99 mg/dL — ABNORMAL HIGH (ref 8–23)
CO2: 14 mmol/L — ABNORMAL LOW (ref 22–32)
Calcium: 9 mg/dL (ref 8.9–10.3)
Chloride: 117 mmol/L — ABNORMAL HIGH (ref 98–111)
Creatinine, Ser: 3.81 mg/dL — ABNORMAL HIGH (ref 0.44–1.00)
GFR calc Af Amer: 13 mL/min — ABNORMAL LOW (ref >60)
GFR calc non Af Amer: 11 mL/min — ABNORMAL LOW (ref >60)
Glucose, Bld: 311 mg/dL — ABNORMAL HIGH (ref 70–99)
Potassium: 5.8 mmol/L — ABNORMAL HIGH (ref 3.5–5.1)
Sodium: 146 mmol/L — ABNORMAL HIGH (ref 135–145)

## 2019-11-15 LAB — URINALYSIS, COMPLETE (UACMP) WITH MICROSCOPIC
Bacteria, UA: NONE SEEN
Bilirubin Urine: NEGATIVE
Glucose, UA: NEGATIVE mg/dL
Hgb urine dipstick: NEGATIVE
Ketones, ur: NEGATIVE mg/dL
Leukocytes,Ua: NEGATIVE
Nitrite: NEGATIVE
Protein, ur: NEGATIVE mg/dL
Specific Gravity, Urine: 1.015 (ref 1.005–1.030)
pH: 5 (ref 5.0–8.0)

## 2019-11-15 LAB — CBC WITH DIFFERENTIAL/PLATELET
Abs Immature Granulocytes: 0.07 10*3/uL (ref 0.00–0.07)
Basophils Absolute: 0.1 10*3/uL (ref 0.0–0.1)
Basophils Relative: 1 %
Eosinophils Absolute: 0 10*3/uL (ref 0.0–0.5)
Eosinophils Relative: 0 %
HCT: 43.2 % (ref 36.0–46.0)
Hemoglobin: 13.4 g/dL (ref 12.0–15.0)
Immature Granulocytes: 1 %
Lymphocytes Relative: 19 %
Lymphs Abs: 1.9 10*3/uL (ref 0.7–4.0)
MCH: 29.9 pg (ref 26.0–34.0)
MCHC: 31 g/dL (ref 30.0–36.0)
MCV: 96.4 fL (ref 80.0–100.0)
Monocytes Absolute: 0.8 10*3/uL (ref 0.1–1.0)
Monocytes Relative: 8 %
Neutro Abs: 7.4 10*3/uL (ref 1.7–7.7)
Neutrophils Relative %: 71 %
Platelets: 233 10*3/uL (ref 150–400)
RBC: 4.48 MIL/uL (ref 3.87–5.11)
RDW: 15 % (ref 11.5–15.5)
WBC: 10.3 10*3/uL (ref 4.0–10.5)
nRBC: 0 % (ref 0.0–0.2)

## 2019-11-15 LAB — SARS CORONAVIRUS 2 BY RT PCR (HOSPITAL ORDER, PERFORMED IN ~~LOC~~ HOSPITAL LAB): SARS Coronavirus 2: NEGATIVE

## 2019-11-15 LAB — GLUCOSE, CAPILLARY: Glucose-Capillary: 282 mg/dL — ABNORMAL HIGH (ref 70–99)

## 2019-11-15 LAB — SALICYLATE LEVEL: Salicylate Lvl: <7 mg/dL — ABNORMAL LOW (ref 7.0–30.0)

## 2019-11-15 LAB — LACTIC ACID, PLASMA: Lactic Acid, Venous: 1.7 mmol/L (ref 0.5–1.9)

## 2019-11-15 LAB — ACETAMINOPHEN LEVEL: Acetaminophen (Tylenol), Serum: <10 ug/mL — ABNORMAL LOW (ref 10–30)

## 2019-11-15 MED ORDER — ASPIRIN 81 MG PO CHEW
81.0000 mg | CHEWABLE_TABLET | Freq: Every day | ORAL | Status: DC
  Administered 2019-11-15 – 2019-11-19 (×5): 81 mg via ORAL
  Filled 2019-11-15 (×5): qty 1

## 2019-11-15 MED ORDER — ONDANSETRON HCL 4 MG PO TABS: 4.0000 mg | ORAL_TABLET | Freq: Four times a day (QID) | ORAL | Status: DC | PRN

## 2019-11-15 MED ORDER — ONDANSETRON HCL 4 MG/2ML IJ SOLN
4.0000 mg | Freq: Four times a day (QID) | INTRAMUSCULAR | Status: DC | PRN
  Administered 2019-11-16 (×2): 4 mg via INTRAVENOUS
  Filled 2019-11-15 (×2): qty 2

## 2019-11-15 MED ORDER — LEVOTHYROXINE SODIUM 50 MCG PO TABS
50.0000 ug | ORAL_TABLET | Freq: Every day | ORAL | Status: DC
  Administered 2019-11-17 – 2019-11-19 (×3): 50 ug via ORAL
  Filled 2019-11-15 (×4): qty 1

## 2019-11-15 MED ORDER — LACTATED RINGERS IV BOLUS
1000.0000 mL | Freq: Once | INTRAVENOUS | Status: AC
  Administered 2019-11-15: 1000 mL via INTRAVENOUS

## 2019-11-15 MED ORDER — DEXTROSE 50 % IV SOLN
1.0000 | Freq: Once | INTRAVENOUS | Status: AC
  Administered 2019-11-15: 50 mL via INTRAVENOUS
  Filled 2019-11-15: qty 50

## 2019-11-15 MED ORDER — ENOXAPARIN SODIUM 30 MG/0.3ML ~~LOC~~ SOLN
30.0000 mg | SUBCUTANEOUS | Status: DC
  Administered 2019-11-15: 30 mg via SUBCUTANEOUS
  Filled 2019-11-15: qty 0.3

## 2019-11-15 MED ORDER — ACETAMINOPHEN 650 MG RE SUPP: 650.0000 mg | Freq: Four times a day (QID) | RECTAL | Status: DC | PRN

## 2019-11-15 MED ORDER — INSULIN ASPART 100 UNIT/ML ~~LOC~~ SOLN
0.0000 [IU] | Freq: Three times a day (TID) | SUBCUTANEOUS | Status: DC
  Administered 2019-11-16: 14:00:00 3 [IU] via SUBCUTANEOUS
  Administered 2019-11-16: 2 [IU] via SUBCUTANEOUS
  Administered 2019-11-16: 3 [IU] via SUBCUTANEOUS
  Administered 2019-11-17: 5 [IU] via SUBCUTANEOUS
  Administered 2019-11-17 – 2019-11-18 (×4): 2 [IU] via SUBCUTANEOUS
  Filled 2019-11-15 (×8): qty 1

## 2019-11-15 MED ORDER — ATORVASTATIN CALCIUM 20 MG PO TABS
40.0000 mg | ORAL_TABLET | Freq: Every day | ORAL | Status: DC
  Administered 2019-11-15 – 2019-11-19 (×5): 40 mg via ORAL
  Filled 2019-11-15 (×5): qty 2

## 2019-11-15 MED ORDER — INSULIN REGULAR HUMAN 100 UNIT/ML IJ SOLN
10.0000 [IU] | Freq: Once | INTRAMUSCULAR | Status: AC
  Administered 2019-11-15: 10 [IU] via INTRAVENOUS
  Filled 2019-11-15: qty 10

## 2019-11-15 MED ORDER — AMLODIPINE BESYLATE 5 MG PO TABS: 10.0000 mg | ORAL_TABLET | Freq: Every day | ORAL | Status: DC

## 2019-11-15 MED ORDER — ALBUTEROL SULFATE (2.5 MG/3ML) 0.083% IN NEBU
2.5000 mg | INHALATION_SOLUTION | RESPIRATORY_TRACT | Status: AC
  Administered 2019-11-15 (×3): 2.5 mg via RESPIRATORY_TRACT
  Filled 2019-11-15: qty 3

## 2019-11-15 MED ORDER — CARVEDILOL 25 MG PO TABS: 25.0000 mg | ORAL_TABLET | Freq: Two times a day (BID) | ORAL | Status: DC

## 2019-11-15 MED ORDER — SODIUM CHLORIDE (PF) 0.9 % IJ SOLN: 10.0000 [IU] | Freq: Once | INTRAMUSCULAR | Status: DC

## 2019-11-15 MED ORDER — STERILE WATER FOR INJECTION IV SOLN
INTRAVENOUS | Status: DC
  Filled 2019-11-15 (×4): qty 850
  Filled 2019-11-15: qty 150
  Filled 2019-11-15 (×5): qty 850
  Filled 2019-11-15 (×2): qty 150
  Filled 2019-11-15 (×2): qty 850

## 2019-11-15 MED ORDER — DONEPEZIL HCL 5 MG PO TABS
10.0000 mg | ORAL_TABLET | Freq: Every day | ORAL | Status: DC
  Administered 2019-11-15 – 2019-11-19 (×5): 10 mg via ORAL
  Filled 2019-11-15 (×5): qty 2

## 2019-11-15 MED ORDER — ACETAMINOPHEN 325 MG PO TABS
650.0000 mg | ORAL_TABLET | Freq: Four times a day (QID) | ORAL | Status: DC | PRN
  Administered 2019-11-19: 10:00:00 650 mg via ORAL
  Filled 2019-11-15: qty 2

## 2019-11-15 NOTE — Progress Notes (Signed)
[facility]  ROUNDING NOTE   Subjective:   [name] [name] was admitted to [facility] on 11/15/2019 for AKI (acute kidney injury) [facility]) [N17.9]  Patient was recently admitted to [facility] from 7/4 to 7/8 for pneumonia and sepsis. Creatinine on discharge on 1.6. Today it is 3.6.  She was admitted from 6/27 to 7/1 for diabetic foot ulcer.   Per chart, patient has had poor oral intake for the last month.    Objective:  Vital signs in last 24 hours:  Temp:  [99.4 F (37.4 C)] 99.4 F (37.4 C) (08/09 0947) Pulse Rate:  [87-88] 88 (08/09 1028) Resp:  [15-20] 20 (08/09 1028) BP: (103-120)/(49-75) 103/49 (08/09 1027) SpO2:  [94 %-96 %] 94 % (08/09 1028) Weight:  [97.1 kg] 97.1 kg (08/09 0948)  Weight change:  Filed Weights   11/15/19 0948  Weight: 97.1 kg    Intake/Output: No intake/output data recorded.   Intake/Output this shift:  No intake/output data recorded.  Physical Exam: General: NAD,   Head: +dry oral mucosal membranes  Eyes: Anicteric, PERRL  Neck: Supple, trachea midline  Lungs:  Clear to auscultation  Heart: Regular rate and rhythm  Abdomen:  Soft, nontender,   Extremities:  no peripheral edema. Left heel wound - nontender  Neurologic: Nonfocal, moving all four extremities  Skin: No lesions        Basic Metabolic Panel: Recent Labs  Lab 11/15/19 1119  NA 148*  K 6.1*  CL 120*  CO2 15*  GLUCOSE 204*  BUN 92*  CREATININE 3.60*  CALCIUM 8.9    Liver Function Tests: Recent Labs  Lab 11/15/19 1119  AST 26  ALT 17  ALKPHOS 83  BILITOT 0.8  PROT 8.8*  ALBUMIN 3.4*   No results for input(s): LIPASE, AMYLASE in the last 168 hours. No results for input(s): AMMONIA in the last 168 hours.  CBC: Recent Labs  Lab 11/15/19 0952  WBC 10.3  NEUTROABS 7.4  HGB 13.4  HCT 43.2  MCV 96.4  PLT 233    Cardiac Enzymes: No results for input(s): CKTOTAL, CKMB, CKMBINDEX, TROPONINI in the last 168 hours.  BNP: Invalid input(s):  POCBNP  CBG: No results for input(s): GLUCAP in the last 168 hours.  Microbiology: Results for orders placed or performed during the hospital encounter of 11/15/19  SARS Coronavirus 2 by RT PCR (hospital order, performed in [facility] hospital lab) Nasopharyngeal Nasopharyngeal Swab     Status: None   Collection Time: 11/15/19  9:52 AM   Specimen: Nasopharyngeal Swab  Result Value Ref Range Status   SARS Coronavirus 2 NEGATIVE NEGATIVE Final    Comment: (NOTE) SARS-CoV-2 target nucleic acids are NOT DETECTED.  The SARS-CoV-2 RNA is generally detectable in upper and lower respiratory specimens during the acute phase of infection. The lowest concentration of SARS-CoV-2 viral copies this assay can detect is 250 copies / mL. A negative result does not preclude SARS-CoV-2 infection and should not be used as the sole basis for treatment or other patient management decisions.  A negative result may occur with improper specimen collection / handling, submission of specimen other than nasopharyngeal swab, presence of viral mutation(s) within the areas targeted by this assay, and inadequate number of viral copies (<250 copies / mL). A negative result must be combined with clinical observations, patient history, and epidemiological information.  Fact Sheet for Patients:   StrictlyIdeas.no  Fact Sheet for Healthcare Providers: BankingDealers.co.za  This test is not yet approved or  cleared  by the [place] and has been authorized for detection and/or diagnosis of SARS-CoV-2 by FDA under an Emergency Use Authorization (EUA).  This EUA will remain in effect (meaning this test can be used) for the duration of the COVID-19 declaration under Section 564(b)(1) of the Act, 21 U.S.C. section 360bbb-3(b)(1), unless the authorization is terminated or revoked sooner.  Performed at [facility], [address] [place], [place]  [address]     Coagulation Studies: No results for input(s): LABPROT, INR in the last 72 hours.  Urinalysis: Recent Labs    11/15/19 0952  COLORURINE YELLOW*  LABSPEC 1.015  PHURINE 5.0  GLUCOSEU NEGATIVE  HGBUR NEGATIVE  BILIRUBINUR NEGATIVE  KETONESUR NEGATIVE  PROTEINUR NEGATIVE  NITRITE NEGATIVE  LEUKOCYTESUR NEGATIVE      Imaging: CT Head Wo Contrast  Result Date: 11/15/2019 CLINICAL DATA:  Altered mental status and lethargy EXAM: CT HEAD WITHOUT CONTRAST TECHNIQUE: Contiguous axial images were obtained from the base of the skull through the vertex without intravenous contrast. COMPARISON:  October 10, 2019 FINDINGS: Brain: Mild diffuse atrophy is stable. There is no intracranial mass, hemorrhage, extra-axial fluid collection, or midline shift. There is evidence of a prior infarct involving the head of the caudate nucleus on the right. There are adjacent prior appearing infarct involving portions of the anterior limbs of the right internal and external capsule. There is patchy small vessel disease throughout the centra semiovale bilaterally. No acute infarct is evident. Vascular: There is no hyperdense vessel. There is calcification in each distal vertebral artery and carotid siphon region. Skull: Bony calvarium appears intact. Sinuses/Orbits: There is mucosal thickening in the right maxillary antrum. There is mucosal thickening in several ethmoid air cells. Visualized orbits appear symmetric bilaterally. Other: Visualized mastoid air cells are clear. IMPRESSION: Stable atrophy with periventricular small vessel disease. 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 No acute infarct evident. No mass or hemorrhage. There are multiple foci of arterial vascular calcification. There is paranasal sinus disease at several sites. Electronically Signed   By: Lowella Grip III M.D.   On: 11/15/2019 10:25   DG Chest Portable 1 View  Result Date: 11/15/2019 CLINICAL DATA:  Fever with  lethargy EXAM: PORTABLE CHEST 1 VIEW COMPARISON:  October 10, 2019 FINDINGS: There is mild atelectatic change in the right mid lung and right base regions. The lungs elsewhere are clear. Heart is mildly enlarged with pulmonary vascularity normal. No adenopathy. No bone lesions. IMPRESSION: Scattered areas of atelectatic change on the right. Lungs otherwise clear. Heart mildly enlarged. Electronically Signed   By: Lowella Grip III M.D.   On: 11/15/2019 10:34     Medications:   .  sodium bicarbonate (isotonic) infusion in sterile water     . albuterol  7.5 mg/hr Nebulization Once  . amLODipine  10 mg Oral Daily  . aspirin  81 mg Oral Daily  . atorvastatin  40 mg Oral Daily  . carvedilol  25 mg Oral BID  . dextrose  1 ampule Intravenous Once  . donepezil  10 mg Oral Daily  . enoxaparin (LOVENOX) injection  30 mg Subcutaneous Q24H  . insulin aspart  0-15 Units Subcutaneous TID WC  . insulin regular  10 Units Intravenous Once  . [START ON 11/16/2019] levothyroxine  50 mcg Oral Q0600   acetaminophen **OR** acetaminophen, ondansetron **OR** ondansetron (ZOFRAN) IV  Assessment/ Plan:  [name] [name] is a 73 y.o. black female with hypothyroidism, hypertension, hyperlipidemia, diabetes mellitus type  II insulin dependent, who was admitted to [facility] on 11/15/2019 for AKI (acute kidney injury) ([place]) [N17.9]   1. Acute renal failure with hyperkalemia, hypernatremia, and metabolic acidosis on chronic kidney disease stage IIIB with proteinuria: baseline creatinine of 1.61, GFR of 31 on 10/14/19 Chronic kidney disease secondary to diabetic nephropathy Acute kidney injury most likely due to prerenal azotemia No IV contrast exposure.  - holding quinapril.  - IV fluids: sodium bicarbonate infusion  2. Hypertension: with preserved systolic function. Home regimen of amlodipine, carvedilol, and quinapril.  Hold all agents  3. Diabetes mellitus type II with chronic kidney disease: insulin dependent.   Complicated by diabetic foot ulcer and charcot neuroarthropathy.    LOS: 0 Shirl Ludington 8/9/20212:44 PM

## 2019-11-15 NOTE — H&P (Signed)
History and Physical    [name] [name]:[id number] DOB: 1945-10-24 DOA: 11/15/2019  PCP: Marguerita Merles, MD   Patient coming from: Home  I have personally briefly reviewed patient's old medical records in [place]  Most of the history was obtained from patient's son over the phone  Chief Complaint: Altered mental status HPI: [name] [name] is a 74 y.o. female with medical history significant for diabetes mellitus, hypertension, hypothyroidism and dyslipidemia who presented to the emergency room via EMS for evaluation of mental status changes and generalized pain. Patient was unable to provide any history and had a fever per EMS with temp 101.74F. Per patient's son she has had poor oral intake for the last 1 month, she has become progressively weak and is requiring assistance with all activities of daily living.  He had tried to break into the hospital ambulate but she refused to come.  She was seen over the weekend by home health nurse who the son thinks may have reached out to [place]. Patient's son states that APS had come to the house and recommended that he call EMS and so patient was brought to the ER for further evaluation. Labs reveal a sodium of 148, potassium of 6.1, serum bicarb of 15 with an anion gap of 13, creatinine of 3.6 compared to baseline of 1.6.  White count of 10.3 hemoglobin of 13.4 Chest x-ray reviewed by me shows clear lungs CT scan of the head without contrast shows stable atrophy with periventricular small vessel disease. 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 No acute infarct evident. No mass or hemorrhage. There are multiple foci of arterial vascular calcification. There is paranasal sinus disease at several sites. Twelve-lead EKG shows sinus rhythm with PACs    ED Course: Patient is a 74 year old African-American female who was brought into the ER by EMS for evaluation of mental status changes.  She is noted to have electrolyte  abnormalities and hyperkalemia.  She will be admitted to the hospital for further evaluation.  Review of Systems: As per HPI otherwise 10 point review of systems negative.    Past Medical History:  Diagnosis Date  . CHF (congestive heart failure) ([place])   . Diabetes mellitus without complication ([place])   . Humerus fracture 04/20/2018   left  . Hyperlipidemia   . Hypertension   . Hypothyroidism   . Vertigo    several yrs ago  . Wears dentures    partial upper and lower (loose)    Past Surgical History:  Procedure Laterality Date  . CATARACT EXTRACTION W/PHACO Left 12/01/2018   Procedure: CATARACT EXTRACTION PHACO AND INTRAOCULAR LENS PLACEMENT ([place]) LEFT DIABETIC;  Surgeon: Birder Robson, MD;  Location: [facility];  Service: Ophthalmology;  Laterality: Left;  Diabetic - insulin  . COLONOSCOPY WITH PROPOFOL N/A 07/07/2019   Procedure: COLONOSCOPY WITH PROPOFOL;  Surgeon: Robert Bellow, MD;  Location: [facility] ENDOSCOPY;  Service: Endoscopy;  Laterality: N/A;  . ESOPHAGOGASTRODUODENOSCOPY (EGD) WITH PROPOFOL N/A 07/07/2019   Procedure: ESOPHAGOGASTRODUODENOSCOPY (EGD) WITH PROPOFOL;  Surgeon: Robert Bellow, MD;  Location: [facility] ENDOSCOPY;  Service: Endoscopy;  Laterality: N/A;  . FOOT SURGERY Right    lesion excision     reports that she has never smoked. She has never used smokeless tobacco. She reports that she does not drink alcohol and does not use drugs.  No Known Allergies  Family History  Problem Relation Age of Onset  . Breast cancer Neg Hx  Prior to Admission medications   Medication Sig Start Date End Date Taking? Authorizing Provider  amLODipine (NORVASC) 10 MG tablet Take 1 tablet by mouth daily. 05/02/16   [provider]  ASPIRIN 81 PO Take by mouth daily.    [provider]  atorvastatin (LIPITOR) 40 MG tablet Take 1 tablet by mouth daily. 05/02/16   [provider]  carvedilol (COREG) 25 MG tablet Take 1 tablet by  mouth 2 (two) times daily. 05/02/16   [provider]  donepezil (ARICEPT) 10 MG tablet Take 10 mg by mouth daily. 04/21/18   [provider]  insulin aspart protamine- aspart (NOVOLOG MIX 70/30) (70-30) 100 UNIT/ML injection Inject 15 Units into the skin daily with breakfast.    [provider]  SYNTHROID 50 MCG tablet Take 1 tablet by mouth daily. 04/21/18   [provider]  TRESIBA FLEXTOUCH 100 UNIT/ML SOPN FlexTouch Pen Inject 80 Units into the skin at bedtime.  04/21/18   [provider]    Physical Exam: Vitals:   11/15/19 0947 11/15/19 0948 11/15/19 1027 11/15/19 1028  BP: 120/75  (!) 103/49   Pulse: 87   88  Resp: 15   20  Temp: 99.4 F (37.4 C)     TempSrc: Oral     SpO2: 96%   94%  Weight:  97.1 kg    Height:  5\' 7"  (1.702 m)       Vitals:   11/15/19 0947 11/15/19 0948 11/15/19 1027 11/15/19 1028  BP: 120/75  (!) 103/49   Pulse: 87   88  Resp: 15   20  Temp: 99.4 F (37.4 C)     TempSrc: Oral     SpO2: 96%   94%  Weight:  97.1 kg    Height:  5\' 7"  (1.702 m)      Constitutional: NAD, alert and oriented x 2. Unkempt Eyes: PERRL, lids and conjunctivae pallor ENMT: Mucous membranes are moist.  Neck: normal, supple, no masses, no thyromegaly Respiratory: clear to auscultation bilaterally, no wheezing, no crackles. Normal respiratory effort. No accessory muscle use.  Cardiovascular: Regular rate and rhythm, no murmurs / rubs / gallops. No extremity edema. 2+ pedal pulses. No carotid bruits.  Abdomen: no tenderness, no masses palpated. No hepatosplenomegaly. Bowel sounds positive.  Musculoskeletal: no clubbing / cyanosis. No joint deformity upper and lower extremities.  Skin: no rashes, lesions, ulcers.  Neurologic: Generalized weakness Psychiatric: Flat and affect.   Labs on Admission: I have personally reviewed following labs and imaging studies  CBC: Recent Labs  Lab 11/15/19 0952  WBC 10.3  NEUTROABS 7.4  HGB  13.4  HCT 43.2  MCV 96.4  PLT 270   Basic Metabolic Panel: Recent Labs  Lab 11/15/19 1119  NA 148*  K 6.1*  CL 120*  CO2 15*  GLUCOSE 204*  BUN 92*  CREATININE 3.60*  CALCIUM 8.9   GFR: Estimated Creatinine Clearance: 16.7 mL/min (A) (by C-G formula based on SCr of 3.6 mg/dL (H)). Liver Function Tests: Recent Labs  Lab 11/15/19 1119  AST 26  ALT 17  ALKPHOS 83  BILITOT 0.8  PROT 8.8*  ALBUMIN 3.4*   No results for input(s): LIPASE, AMYLASE in the last 168 hours. No results for input(s): AMMONIA in the last 168 hours. Coagulation Profile: No results for input(s): INR, PROTIME in the last 168 hours. Cardiac Enzymes: No results for input(s): CKTOTAL, CKMB, CKMBINDEX, TROPONINI in the last 168 hours. BNP (last 3 results) No results  for input(s): PROBNP in the last 8760 hours. HbA1C: No results for input(s): HGBA1C in the last 72 hours. CBG: No results for input(s): GLUCAP in the last 168 hours. Lipid Profile: No results for input(s): CHOL, HDL, LDLCALC, TRIG, CHOLHDL, LDLDIRECT in the last 72 hours. Thyroid Function Tests: No results for input(s): TSH, T4TOTAL, FREET4, T3FREE, THYROIDAB in the last 72 hours. Anemia Panel: No results for input(s): VITAMINB12, FOLATE, FERRITIN, TIBC, IRON, RETICCTPCT in the last 72 hours. Urine analysis:    Component Value Date/Time   COLORURINE YELLOW (A) 11/15/2019 0952   APPEARANCEUR HAZY (A) 11/15/2019 0952   APPEARANCEUR Clear 04/21/2013 0715   LABSPEC 1.015 11/15/2019 0952   LABSPEC 1.015 04/21/2013 0715   PHURINE 5.0 11/15/2019 0952   GLUCOSEU NEGATIVE 11/15/2019 0952   GLUCOSEU 50 mg/dL 04/21/2013 0715   HGBUR NEGATIVE 11/15/2019 0952   BILIRUBINUR NEGATIVE 11/15/2019 0952   BILIRUBINUR Negative 04/21/2013 0715   KETONESUR NEGATIVE 11/15/2019 0952   PROTEINUR NEGATIVE 11/15/2019 0952   NITRITE NEGATIVE 11/15/2019 0952   LEUKOCYTESUR NEGATIVE 11/15/2019 0952   LEUKOCYTESUR Negative 04/21/2013 0715    Radiological  Exams on Admission: CT Head Wo Contrast  Result Date: 11/15/2019 CLINICAL DATA:  Altered mental status and lethargy EXAM: CT HEAD WITHOUT CONTRAST TECHNIQUE: Contiguous axial images were obtained from the base of the skull through the vertex without intravenous contrast. COMPARISON:  October 10, 2019 FINDINGS: Brain: Mild diffuse atrophy is stable. There is no intracranial mass, hemorrhage, extra-axial fluid collection, or midline shift. There is evidence of a prior infarct involving the head of the caudate nucleus on the right. There are adjacent prior appearing infarct involving portions of the anterior limbs of the right internal and external capsule. There is patchy small vessel disease throughout the centra semiovale bilaterally. No acute infarct is evident. Vascular: There is no hyperdense vessel. There is calcification in each distal vertebral artery and carotid siphon region. Skull: Bony calvarium appears intact. Sinuses/Orbits: There is mucosal thickening in the right maxillary antrum. There is mucosal thickening in several ethmoid air cells. Visualized orbits appear symmetric bilaterally. Other: Visualized mastoid air cells are clear. IMPRESSION: Stable atrophy with periventricular small vessel disease. 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 No acute infarct evident. No mass or hemorrhage. There are multiple foci of arterial vascular calcification. There is paranasal sinus disease at several sites. Electronically Signed   By: Lowella Grip III M.D.   On: 11/15/2019 10:25   DG Chest Portable 1 View  Result Date: 11/15/2019 CLINICAL DATA:  Fever with lethargy EXAM: PORTABLE CHEST 1 VIEW COMPARISON:  October 10, 2019 FINDINGS: There is mild atelectatic change in the right mid lung and right base regions. The lungs elsewhere are clear. Heart is mildly enlarged with pulmonary vascularity normal. No adenopathy. No bone lesions. IMPRESSION: Scattered areas of atelectatic change on the  right. Lungs otherwise clear. Heart mildly enlarged. Electronically Signed   By: Lowella Grip III M.D.   On: 11/15/2019 10:34    EKG: Independently reviewed.  Sinus rhythm with PACs  Assessment/Plan Principal Problem:   AKI (acute kidney injury) ([place]) Active Problems:   HTN (hypertension)   Type 2 diabetes mellitus with stage 3 chronic kidney disease (HCC)   Hypothyroidism (acquired)   Hyperkalemia   Physical deconditioning     Acute kidney injury Unclear etiology may be prerenal At baseline patient has chronic kidney disease with a serum creatinine of 1.6. Her creatinine today on admission was 3.6 with an anion  gap of 13 and hyperkalemia We will start patient on a bicarbonate infusion We will obtain CK levels to rule out rhabdomyolysis We will consult nephrology Repeat renal parameters in a.m.   Hyperkalemia Etiology unclear Rule out renal tubular acidosis type IV We will treat with dextrose, insulin, bronchodilators and bicarbonate Repeat potassium level at 6 PM   Type 2 diabetes mellitus with stage III chronic kidney disease Maintain consistent carbohydrate diet Sliding scale coverage with insulin Accu-Cheks before meals and at bedtime   Hypothyroidism Continue Synthroid   Physical deconditioning  Patient appears severely deconditioned and is very weak.  She requires assistance with all activities of daily living Will request PT evaluation   Hypertension  Continue amlodipine and Carvedilol   ?? Vascular Dementia Review of CT head shows prior infarcts in the right basal ganglia region Patient is forgetful and son states that she has never been diagnosed with dementia but that he was told she has memory loss related to aging Continue Aricept   Fever Patient had a Temp of 101.12F in the field Awaiting results of her COVID-19 PCR test  DVT prophylaxis: Lovenox Code Status: Full code Family Communication: Greater than 50% of time was spent  discussing patient's condition and plan of care with her son Adea Geisel over the phone.  All questions and concerns have been addressed. Disposition Plan: Back to previous home environment Consults called: Nephrology     Aadhav Uhlig MD [facility]     11/15/2019, 2:00 PM

## 2019-11-15 NOTE — ED Notes (Signed)
Pt able to tolerate small amount of crackers at this time.

## 2019-11-15 NOTE — ED Provider Notes (Signed)
[facility] Emergency Department Provider Note ____________________________________________   First MD Initiated Contact with Patient 11/15/19 [phone]     (approximate)  I have reviewed the triage vital signs and the nursing notes.  HISTORY  Chief Complaint Altered Mental Status   HPI [name] [name] is a 74 y.o. femalewho presents to the ED for evaluation of lethargy.  Chart review indicates history of HTN, HLD, DM on insulin. EMS picked the patient up from home, where his son was present and reports inability to care for the patient.  EMS reports DSS was at the home, and they were the ones who called for EMS/medical evaluation.  Patient initially unable to provide any significant medical history due to her altered mental status and lethargy.  This limits my initial evaluation. Subsequent evaluation once patient is more clinically sharp and cogent, does not yield any significant history.   Further history provided by the son, [name], as indicated below reports the patient has been more generalized weak over the past few days, and she has been continuing to refuse to go to the hospital.  [name] reports calling 911 multiple times, but she always refuses transport to the hospital.  He reports APS got involved today and helped course the patient to come into the ED for evaluation.    Past Medical History:  Diagnosis Date  . CHF (congestive heart failure) ([place])   . Diabetes mellitus without complication ([place])   . Humerus fracture 04/20/2018   left  . Hyperlipidemia   . Hypertension   . Hypothyroidism   . Vertigo    several yrs ago  . Wears dentures    partial upper and lower (loose)    Patient Active Problem List   Diagnosis Date Noted  . AKI (acute kidney injury) ([place]) 11/15/2019  . Physical deconditioning 11/15/2019  . Aspiration pneumonia ([place]) 10/10/2019  . Hyperkalemia   . Rib pain on left side   . Leukocytosis 10/04/2019  . Fall at home, initial  encounter 10/04/2019  . Cellulitis of right leg 10/04/2019  . CKD stage 4 due to type 2 diabetes mellitus ([place]) 10/04/2019  . Acute kidney injury superimposed on CKD ([place])   . RUQ pain   . Hyperlipidemia   . Right leg pain 10/03/2019  . Diabetic ulcer of right foot associated with type 2 diabetes mellitus, with fat layer exposed ([place]) 10/03/2019  . Cellulitis of right foot 10/03/2019  . HTN (hypertension) 10/03/2019  . Type 2 diabetes mellitus with stage 3 chronic kidney disease ([place]) 10/03/2019  . Hypothyroidism (acquired) 10/03/2019  . UTI (urinary tract infection) 10/03/2019  . Sepsis ([place]) 04/25/2018    Past Surgical History:  Procedure Laterality Date  . CATARACT EXTRACTION W/PHACO Left 12/01/2018   Procedure: CATARACT EXTRACTION PHACO AND INTRAOCULAR LENS PLACEMENT ([place]) LEFT DIABETIC;  Surgeon: Birder Robson, MD;  Location: [place];  Service: Ophthalmology;  Laterality: Left;  Diabetic - insulin  . COLONOSCOPY WITH PROPOFOL N/A 07/07/2019   Procedure: COLONOSCOPY WITH PROPOFOL;  Surgeon: Robert Bellow, MD;  Location: [facility] ENDOSCOPY;  Service: Endoscopy;  Laterality: N/A;  . ESOPHAGOGASTRODUODENOSCOPY (EGD) WITH PROPOFOL N/A 07/07/2019   Procedure: ESOPHAGOGASTRODUODENOSCOPY (EGD) WITH PROPOFOL;  Surgeon: Robert Bellow, MD;  Location: [facility] ENDOSCOPY;  Service: Endoscopy;  Laterality: N/A;  . FOOT SURGERY Right    lesion excision    Prior to Admission medications   Medication Sig Start Date End Date Taking? Authorizing Provider  amLODipine (NORVASC) 10 MG tablet Take 1 tablet  by mouth daily. 05/02/16   [provider]  ASPIRIN 81 PO Take by mouth daily.    [provider]  atorvastatin (LIPITOR) 40 MG tablet Take 1 tablet by mouth daily. 05/02/16   [provider]  carvedilol (COREG) 25 MG tablet Take 1 tablet by mouth 2 (two) times daily. 05/02/16   [provider]  donepezil (ARICEPT) 10 MG tablet Take 10 mg by mouth  daily. 04/21/18   [provider]  insulin aspart protamine- aspart (NOVOLOG MIX 70/30) (70-30) 100 UNIT/ML injection Inject 15 Units into the skin daily with breakfast.    [provider]  SYNTHROID 50 MCG tablet Take 1 tablet by mouth daily. 04/21/18   [provider]  TRESIBA FLEXTOUCH 100 UNIT/ML SOPN FlexTouch Pen Inject 80 Units into the skin at bedtime.  04/21/18   [provider]    Allergies Patient has no known allergies.  Family History  Problem Relation Age of Onset  . Breast cancer Neg Hx     Social History Social History   Tobacco Use  . Smoking status: Never Smoker  . Smokeless tobacco: Never Used  Vaping Use  . Vaping Use: Never used  Substance Use Topics  . Alcohol use: No  . Drug use: Never    Review of Systems  Unable to be reliably obtain due to patient's altered mental status and lethargy on presentation.  ____________________________________________   PHYSICAL EXAM:  VITAL SIGNS: Vitals:   11/15/19 1430 11/15/19 1500  BP: 111/75 114/75  Pulse: 70   Resp: 14 15  Temp:    SpO2: 93%       Constitutional: Alert and oriented to person, place and time.  Very poor oral hygiene, poor dentition, chewed up and unswallowed crackers throughout her mouth. Eyes: Conjunctivae are normal. PERRL. EOMI. Head: Atraumatic. Nose: No congestion/rhinnorhea. Mouth/Throat: Mucous membranes are very dry.  Oropharynx non-erythematous. Neck: No stridor. No cervical spine tenderness to palpation. Cardiovascular: Normal rate, regular rhythm. Grossly normal heart sounds.  Good peripheral circulation. Respiratory: Normal respiratory effort.  No retractions. Lungs CTAB. Gastrointestinal: Soft , nondistended, nontender to palpation. No abdominal bruits. No CVA tenderness. Musculoskeletal: No lower extremity tenderness nor edema.  No joint effusions. No signs of acute trauma. Neurologic:  Normal speech and language. No gross focal  neurologic deficits are appreciated. No gait instability noted. Skin:  Skin is warm, dry and intact. No rash noted. Psychiatric: Mood and affect are normal. Speech and behavior are normal.  ____________________________________________   LABS (all labs ordered are listed, but only abnormal results are displayed)  Labs Reviewed  URINALYSIS, COMPLETE (UACMP) WITH MICROSCOPIC - Abnormal; Notable for the following components:      Result Value   Color, Urine YELLOW (*)    APPearance HAZY (*)    All other components within normal limits  COMPREHENSIVE METABOLIC PANEL - Abnormal; Notable for the following components:   Sodium 148 (*)    Potassium 6.1 (*)    Chloride 120 (*)    CO2 15 (*)    Glucose, Bld 204 (*)    BUN 92 (*)    Creatinine, Ser 3.60 (*)    Total Protein 8.8 (*)    Albumin 3.4 (*)    GFR calc non Af Amer 12 (*)    GFR calc Af Amer 14 (*)    All other components within normal limits  ACETAMINOPHEN LEVEL - Abnormal; Notable for the following components:   Acetaminophen (Tylenol), Serum <10 (*)  All other components within normal limits  SALICYLATE LEVEL - Abnormal; Notable for the following components:   Salicylate Lvl <4.1 (*)    All other components within normal limits  SARS CORONAVIRUS 2 BY RT PCR (HOSPITAL ORDER, [facility])  CBC WITH DIFFERENTIAL/PLATELET  LACTIC ACID, PLASMA  BASIC METABOLIC PANEL  CK ISOENZYMES   ____________________________________________  12 Lead EKG Sinus rhythm with sinus arrhythmia, rate of 88 bpm.  Normal axis and intervals.  New T wave inversions isolated to leads I and aVL without ST elevations or depressions to suggest acute ischemia.  ____________________________________________  RADIOLOGY  ED MD interpretation:    Official radiology report(s): CT Head Wo Contrast  Result Date: 11/15/2019 CLINICAL DATA:  Altered mental status and lethargy EXAM: CT HEAD WITHOUT CONTRAST TECHNIQUE: Contiguous  axial images were obtained from the base of the skull through the vertex without intravenous contrast. COMPARISON:  October 10, 2019 FINDINGS: Brain: Mild diffuse atrophy is stable. There is no intracranial mass, hemorrhage, extra-axial fluid collection, or midline shift. There is evidence of a prior infarct involving the head of the caudate nucleus on the right. There are adjacent prior appearing infarct involving portions of the anterior limbs of the right internal and external capsule. There is patchy small vessel disease throughout the centra semiovale bilaterally. No acute infarct is evident. Vascular: There is no hyperdense vessel. There is calcification in each distal vertebral artery and carotid siphon region. Skull: Bony calvarium appears intact. Sinuses/Orbits: There is mucosal thickening in the right maxillary antrum. There is mucosal thickening in several ethmoid air cells. Visualized orbits appear symmetric bilaterally. Other: Visualized mastoid air cells are clear. IMPRESSION: Stable atrophy with periventricular small vessel disease. 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 No acute infarct evident. No mass or hemorrhage. There are multiple foci of arterial vascular calcification. There is paranasal sinus disease at several sites. Electronically Signed   By: Lowella Grip III M.D.   On: 11/15/2019 10:25   DG Chest Portable 1 View  Result Date: 11/15/2019 CLINICAL DATA:  Fever with lethargy EXAM: PORTABLE CHEST 1 VIEW COMPARISON:  October 10, 2019 FINDINGS: There is mild atelectatic change in the right mid lung and right base regions. The lungs elsewhere are clear. Heart is mildly enlarged with pulmonary vascularity normal. No adenopathy. No bone lesions. IMPRESSION: Scattered areas of atelectatic change on the right. Lungs otherwise clear. Heart mildly enlarged. Electronically Signed   By: Lowella Grip III M.D.   On: 11/15/2019 10:34     ____________________________________________   PROCEDURES and INTERVENTIONS  Procedure(s) performed (including Critical Care):  Procedures  Medications  aspirin chewable tablet 81 mg (has no administration in time range)  amLODipine (NORVASC) tablet 10 mg (has no administration in time range)  atorvastatin (LIPITOR) tablet 40 mg (has no administration in time range)  carvedilol (COREG) tablet 25 mg (has no administration in time range)  donepezil (ARICEPT) tablet 10 mg (has no administration in time range)  levothyroxine (SYNTHROID) tablet 50 mcg (has no administration in time range)  insulin aspart (novoLOG) injection 0-15 Units (has no administration in time range)  enoxaparin (LOVENOX) injection 30 mg (has no administration in time range)  acetaminophen (TYLENOL) tablet 650 mg (has no administration in time range)    Or  acetaminophen (TYLENOL) suppository 650 mg (has no administration in time range)  ondansetron (ZOFRAN) tablet 4 mg (has no administration in time range)    Or  ondansetron (ZOFRAN) injection 4 mg (  has no administration in time range)  sodium bicarbonate 150 mEq in sterile water 1,000 mL infusion (has no administration in time range)  dextrose 50 % solution 50 mL (has no administration in time range)  albuterol (PROVENTIL,VENTOLIN) solution continuous neb (has no administration in time range)  insulin regular (NOVOLIN R) 100 units/mL injection 10 Units (has no administration in time range)  lactated ringers bolus 1,000 mL (0 mLs Intravenous Stopped 11/15/19 1534)    ____________________________________________   INITIAL IMPRESSION / ASSESSMENT AND PLAN / ED COURSE  74 year old woman presenting from home lethargic with evidence of severe AKI and EKG changes requiring medical admission.  While EMS reports fever prehospital, normal vital signs on room air here.  Exam with evidence of severe dehydration and a nondistressed patient, no evidence of trauma or focal  neurologic deficits.  Blood work with evidence of significant AKI.  CT imaging without evidence of ICH or stroke.  Due to the severity of her metabolic derangements, her EKG changes, and concerned about nonsafe discharge plan when discussing with her son, we will admit the patient to hospitalist medicine for further work-up and management.  Clinical Course as of Nov 15 1538  Mon Nov 15, 2019  1056 Reassessed.  Patient reports improving symptoms.  She reports feeling well, and that she was "just feeling a little confused" this morning.  Requesting water.   [DS]  1209 Comprehensive metabolic panel(!) [DS]  2111 Reassessed.  Patient reports feeling improved.  She is sharper and oriented x4.  She reports having a sensation of dry mouth, and "maybe not drinking as much" this weekend.  Denies vomiting, diarrhea, abdominal pain, recent illnesses or fevers.  She reports desire for p.o. fluids, metabolic panel recheck and the possibility of discharge.   [DS]  5520 Provide the patient a glass of ice water.  She was able to drink this, expresses thanks.   [DS]  [place], son.  Been with him for the past few weeks.  Less mobile, not eating as much, past few days.  Weaker and weaker.  APS came in today, told her she had to come in.   [DS]    Clinical Course User Index [DS] Vladimir Crofts, MD     ____________________________________________   FINAL CLINICAL IMPRESSION(S) / ED DIAGNOSES  Final diagnoses:  Dehydration  AKI (acute kidney injury) ([place])  Metabolic acidosis     ED Discharge Orders    None       Tekila Caillouet   Note:  This document was prepared using Dragon voice recognition software and may include unintentional dictation errors.   Vladimir Crofts, MD 11/15/19 [phone]

## 2019-11-15 NOTE — ED Triage Notes (Signed)
Pt presents via [facility]ms with c/o altered mental status and generalized pain. temp 101.5 axillary for ems. Pt lethargic but following commands at this time. Pt disoriented to time ans situation at this time.

## 2019-11-15 NOTE — ED Notes (Signed)
Called lab to draw BMP and CK, patient has no IV access

## 2019-11-15 NOTE — ED Notes (Signed)
Pt given crackers to PO trial

## 2019-11-15 NOTE — ED Notes (Signed)
Pt fed by this RN

## 2019-11-16 ENCOUNTER — Inpatient Hospital Stay: Payer: Medicare Other

## 2019-11-16 LAB — TSH: TSH: 2.078 u[IU]/mL (ref 0.350–4.500)

## 2019-11-16 LAB — BASIC METABOLIC PANEL
Anion gap: 15 (ref 5–15)
BUN: 96 mg/dL — ABNORMAL HIGH (ref 8–23)
CO2: 16 mmol/L — ABNORMAL LOW (ref 22–32)
Calcium: 8.9 mg/dL (ref 8.9–10.3)
Chloride: 116 mmol/L — ABNORMAL HIGH (ref 98–111)
Creatinine, Ser: 3.6 mg/dL — ABNORMAL HIGH (ref 0.44–1.00)
GFR calc Af Amer: 14 mL/min — ABNORMAL LOW (ref >60)
GFR calc non Af Amer: 12 mL/min — ABNORMAL LOW (ref >60)
Glucose, Bld: 180 mg/dL — ABNORMAL HIGH (ref 70–99)
Potassium: 5.3 mmol/L — ABNORMAL HIGH (ref 3.5–5.1)
Sodium: 147 mmol/L — ABNORMAL HIGH (ref 135–145)

## 2019-11-16 LAB — CBC
HCT: 38.7 % (ref 36.0–46.0)
Hemoglobin: 12.3 g/dL (ref 12.0–15.0)
MCH: 29.8 pg (ref 26.0–34.0)
MCHC: 31.8 g/dL (ref 30.0–36.0)
MCV: 93.7 fL (ref 80.0–100.0)
Platelets: 201 10*3/uL (ref 150–400)
RBC: 4.13 MIL/uL (ref 3.87–5.11)
RDW: 14.9 % (ref 11.5–15.5)
WBC: 12.5 10*3/uL — ABNORMAL HIGH (ref 4.0–10.5)
nRBC: 0 % (ref 0.0–0.2)

## 2019-11-16 LAB — GLUCOSE, CAPILLARY
Glucose-Capillary: 174 mg/dL — ABNORMAL HIGH (ref 70–99)
Glucose-Capillary: 158 mg/dL — ABNORMAL HIGH (ref 70–99)
Glucose-Capillary: 138 mg/dL — ABNORMAL HIGH (ref 70–99)
Glucose-Capillary: 105 mg/dL — ABNORMAL HIGH (ref 70–99)

## 2019-11-16 LAB — PHOSPHORUS: Phosphorus: 4.6 mg/dL (ref 2.5–4.6)

## 2019-11-16 LAB — MAGNESIUM: Magnesium: 3.5 mg/dL — ABNORMAL HIGH (ref 1.7–2.4)

## 2019-11-16 MED ORDER — CARVEDILOL 3.125 MG PO TABS
12.5000 mg | ORAL_TABLET | Freq: Two times a day (BID) | ORAL | Status: DC
  Administered 2019-11-16 – 2019-11-19 (×7): 12.5 mg via ORAL
  Filled 2019-11-16 (×8): qty 4

## 2019-11-16 MED ORDER — SODIUM ZIRCONIUM CYCLOSILICATE 10 G PO PACK
10.0000 g | PACK | Freq: Once | ORAL | Status: AC
  Administered 2019-11-16: 15:00:00 10 g via ORAL
  Filled 2019-11-16: qty 1

## 2019-11-16 MED ORDER — HEPARIN SODIUM (PORCINE) 5000 UNIT/ML IJ SOLN
5000.0000 [IU] | Freq: Three times a day (TID) | INTRAMUSCULAR | Status: DC
  Administered 2019-11-16 – 2019-11-19 (×9): 5000 [IU] via SUBCUTANEOUS
  Filled 2019-11-16 (×9): qty 1

## 2019-11-16 NOTE — Evaluation (Signed)
Physical Therapy Evaluation Patient Details Name: [name] [name] MRN: [id number] DOB: 30-Jul-1945 Today's Date: 11/16/2019   History of Present Illness  Pt is a 74 y.o. female w/ PMH of HTN, DM, hypothyroidism, dyslipidemia, CHF, CKD stage 3. 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  Clinical Impression  Pt's potassium 5.3, okay for pt to participate in PT services w/ no restrictions per MD. Pt was found in supine, AxOx4 but generally appeared to be mildly confused throughout session. Pt required +2 max-total assist for bed mobility, including supine-sit and sit-supine. Pt able to roll in both directions w/ +1 max-A as well as significant cueing and encouragement; required mod-A to briefly maintain sidelying position. Once in sitting, pt unable to independently maintain sitting balance and required mod-A from therapist to briefly sit at EOB. Further transfers/ambulation deferred 2/2 safety and significant difficulty w/ lower levels of mobility. Pt did not demonstrate nearly enough functional mobility for a safe return home and would require significant caregiver assistance for basic mobility and transfers. Pt will benefit from PT services in a SNF setting upon discharge to safely address deficits listed in patient problem list for decreased caregiver assistance and eventual return to PLOF.     Follow Up Recommendations SNF;Supervision/Assistance - 24 hour    Equipment Recommendations  Other (comment) (tbd at next venue of care)    Recommendations for Other Services       Precautions / Restrictions Precautions Precautions: Fall Restrictions Weight Bearing Restrictions: No      Mobility  Bed Mobility Overal bed mobility: Needs Assistance Bed Mobility: Supine to Sit;Sit to Supine;Rolling Rolling: Max assist   Supine to sit: Total assist;+2 for physical  assistance;Max assist Sit to supine: Total assist;+2 for physical assistance;Max assist   General bed mobility comments: Pt generally unable to provide any effort towards bed mobilty. Required +2 max-total assist to complete movement. Max-Ax1 for rolling with significant cueing and encouragement  Transfers                 General transfer comment: deferred- pt unable to perform bed mobility / independently maintain sitting balance  Ambulation/Gait             General Gait Details: deferred- pt unable to perform bed mobility / independently maintain sitting balance  Stairs            Wheelchair Mobility    Modified Rankin (Stroke Patients Only)       Balance Overall balance assessment: Needs assistance Sitting-balance support: Feet supported;Bilateral upper extremity supported Sitting balance-Leahy Scale: Poor Sitting balance - Comments: pt unable to independently maintain sitting; required mod-A from therapist       Standing balance comment: deferred- pt unable to perform bed mobility / independently maintain sitting balance                             Pertinent Vitals/Pain Pain Assessment: Faces Faces Pain Scale: Hurts little more Pain Location: pt reports mod pain but unable to specify where. Clearly has discomfort at R heel ulcer, seems to have some pain w/ movement of LLE as well Pain Intervention(s): Monitored during session;Limited activity within patient's tolerance;Repositioned    Home Living Family/patient expects to be discharged to:: Private residence Living Arrangements: Children Available Help at Discharge: Family;Available 24 hours/day Type of Home: House Home Access: Stairs to enter Entrance Stairs-Rails:  Left;Right Entrance Stairs-Number of Steps: 4 Home Layout: One level Home Equipment: Cane - single point;Shower seat Additional Comments: July 2021 fall leading to a hospital admission    Prior Function            Comments: Pt has had the R foot ulcer for ~33yr. Per PT note July 2021, pt required max-A from son for ADLs but he was unable to help w/ pericare/hygeine and she had been sitting in urine/feces. No caregiver present this session, but pt currently requires max-A for all ADLs, uses wheelchair for primary ambulation. Pt reports she can only stand up to pivot to the toilet (per previous PT note, this is likely inconsistent). Pt reports unable to get in/out of home w/o EMS due to stairs.     Hand Dominance   Dominant Hand: Left    Extremity/Trunk Assessment   Upper Extremity Assessment Upper Extremity Assessment: Generalized weakness    Lower Extremity Assessment Lower Extremity Assessment: Generalized weakness (grossly very weak; required physical assist to move BLE even small amounts)       Communication   Communication: No difficulties  Cognition Arousal/Alertness: Awake/alert Behavior During Therapy: WFL for tasks assessed/performed Overall Cognitive Status: No family/caregiver present to determine baseline cognitive functioning                                 General Comments: A&Ox4; soft spoken at times and could be difficult to understand; appeared mildly confused throughout session.      General Comments General comments (skin integrity, edema, etc.): general skin deterioration / pressure injuries along low back and sacral region    Exercises Total Joint Exercises Ankle Circles/Pumps: AROM;Strengthening;Both;10 reps Quad Sets: AROM;Strengthening;Both;5 reps Heel Slides: AAROM;Strengthening;Both;10 reps Hip ABduction/ADduction: AAROM;Strengthening;Both;10 reps Straight Leg Raises: AAROM;Strengthening;Both;10 reps   Assessment/Plan    PT Assessment Patient needs continued PT services  PT Problem List Decreased strength;Decreased activity tolerance;Decreased balance;Decreased knowledge of use of DME;Decreased mobility;Decreased safety awareness       PT  Treatment Interventions DME instruction;Gait training;Stair training;Functional mobility training;Therapeutic activities;Patient/family education;Balance training;Therapeutic exercise    PT Goals (Current goals can be found in the Care Plan section)  Acute Rehab PT Goals PT Goal Formulation: Patient unable to participate in goal setting    Frequency Min 2X/week   Barriers to discharge Inaccessible home environment;Decreased caregiver support Per hx, pt unable to enter or leave home w/o EMS. Pt globally requires max-A from son for ADLs; currently pt +2 max-total assist for bed mobility    Co-evaluation               AM-PAC PT "6 Clicks" Mobility  Outcome Measure Help needed turning from your back to your side while in a flat bed without using bedrails?: Total Help needed moving from lying on your back to sitting on the side of a flat bed without using bedrails?: Total Help needed moving to and from a bed to a chair (including a wheelchair)?: Total Help needed standing up from a chair using your arms (e.g., wheelchair or bedside chair)?: Total Help needed to walk in hospital room?: Total Help needed climbing 3-5 steps with a railing? : Total 6 Click Score: 6    End of Session   Activity Tolerance: Patient limited by fatigue Patient left: in bed;with bed alarm set;with nursing/sitter in room Nurse Communication: Mobility status PT Visit Diagnosis: Unsteadiness on feet (R26.81);Muscle weakness (generalized) (M62.81);History of falling (Z91.81);Difficulty in  walking, not elsewhere classified (R26.2)    Time: 2481-8590 PT Time Calculation (min) (ACUTE ONLY): 34 min   Charges:             Shandee Jergens SPT 11/16/19, 5:31 PM

## 2019-11-16 NOTE — Progress Notes (Signed)
[facility]  ROUNDING NOTE   Subjective:   Alert and responsive this morning. She is feeling better. States she vomits every time she eats.   Creatinine 3.6 (3.81) Co2 16 (14) K 5.3 (5.8) Na 147 (146)  Bicarb infusion at 178mL/hr   Objective:  Vital signs in last 24 hours:  Temp:  [97.6 F (36.4 C)-98.9 F (37.2 C)] 97.6 F (36.4 C) (08/10 1238) Pulse Rate:  [68-75] 75 (08/10 1238) Resp:  [15-18] 18 (08/10 1238) BP: (107-145)/(66-94) 145/71 (08/10 1238) SpO2:  [93 %-97 %] 93 % (08/10 1238)  Weight change:  Filed Weights   11/15/19 0948  Weight: 97.1 kg    Intake/Output: No intake/output data recorded.   Intake/Output this shift:  Total I/O In: 240 [P.O.:240] Out: 200 [Emesis/NG output:200]  Physical Exam: General: NAD,   Head: +moist oral mucosal membranes  Eyes: Anicteric, PERRL  Neck: Supple, trachea midline  Lungs:  Clear to auscultation  Heart: Regular rate and rhythm  Abdomen:  Soft, nontender,   Extremities:  no peripheral edema.   Neurologic: Nonfocal, moving all four extremities  Skin: No lesions        Basic Metabolic Panel: Recent Labs  Lab 11/15/19 1119 11/15/19 2002 11/16/19 0628  NA 148* 146* 147*  K 6.1* 5.8* 5.3*  CL 120* 117* 116*  CO2 15* 14* 16*  GLUCOSE 204* 311* 180*  BUN 92* 99* 96*  CREATININE 3.60* 3.81* 3.60*  CALCIUM 8.9 9.0 8.9  MG  --   --  3.5*  PHOS  --   --  4.6    Liver Function Tests: Recent Labs  Lab 11/15/19 1119  AST 26  ALT 17  ALKPHOS 83  BILITOT 0.8  PROT 8.8*  ALBUMIN 3.4*   No results for input(s): LIPASE, AMYLASE in the last 168 hours. No results for input(s): AMMONIA in the last 168 hours.  CBC: Recent Labs  Lab 11/15/19 0952 11/16/19 0628  WBC 10.3 12.5*  NEUTROABS 7.4  --   HGB 13.4 12.3  HCT 43.2 38.7  MCV 96.4 93.7  PLT 233 201    Cardiac Enzymes: No results for input(s): CKTOTAL, CKMB, CKMBINDEX, TROPONINI in the last 168 hours.  BNP: Invalid input(s):  POCBNP  CBG: Recent Labs  Lab 11/15/19 2128 11/16/19 0830 11/16/19 1236  GLUCAP 282* 174* 158*    Microbiology: Results for orders placed or performed during the hospital encounter of 11/15/19  SARS Coronavirus 2 by RT PCR (hospital order, performed in [facility] hospital lab) Nasopharyngeal Nasopharyngeal Swab     Status: None   Collection Time: 11/15/19  9:52 AM   Specimen: Nasopharyngeal Swab  Result Value Ref Range Status   SARS Coronavirus 2 NEGATIVE NEGATIVE Final    Comment: (NOTE) SARS-CoV-2 target nucleic acids are NOT DETECTED.  The SARS-CoV-2 RNA is generally detectable in upper and lower respiratory specimens during the acute phase of infection. The lowest concentration of SARS-CoV-2 viral copies this assay can detect is 250 copies / mL. A negative result does not preclude SARS-CoV-2 infection and should not be used as the sole basis for treatment or other patient management decisions.  A negative result may occur with improper specimen collection / handling, submission of specimen other than nasopharyngeal swab, presence of viral mutation(s) within the areas targeted by this assay, and inadequate number of viral copies (<250 copies / mL). A negative result must be combined with clinical observations, patient history, and epidemiological information.  Fact Sheet for Patients:  StrictlyIdeas.no  Fact Sheet for Healthcare Providers: BankingDealers.co.za  This test is not yet approved or  cleared by the [place] FDA and has been authorized for detection and/or diagnosis of SARS-CoV-2 by FDA under an Emergency Use Authorization (EUA).  This EUA will remain in effect (meaning this test can be used) for the duration of the COVID-19 declaration under Section 564(b)(1) of the Act, 21 U.S.C. section 360bbb-3(b)(1), unless the authorization is terminated or revoked sooner.  Performed at [facility], [address] [place], [place] [address]     Coagulation Studies: No results for input(s): LABPROT, INR in the last 72 hours.  Urinalysis: Recent Labs    11/15/19 0952  COLORURINE YELLOW*  LABSPEC 1.015  PHURINE 5.0  GLUCOSEU NEGATIVE  HGBUR NEGATIVE  BILIRUBINUR NEGATIVE  KETONESUR NEGATIVE  PROTEINUR NEGATIVE  NITRITE NEGATIVE  LEUKOCYTESUR NEGATIVE      Imaging: CT Head Wo Contrast  Result Date: 11/15/2019 CLINICAL DATA:  Altered mental status and lethargy EXAM: CT HEAD WITHOUT CONTRAST TECHNIQUE: Contiguous axial images were obtained from the base of the skull through the vertex without intravenous contrast. COMPARISON:  October 10, 2019 FINDINGS: Brain: Mild diffuse atrophy is stable. There is no intracranial mass, hemorrhage, extra-axial fluid collection, or midline shift. There is evidence of a prior infarct involving the head of the caudate nucleus on the right. There are adjacent prior appearing infarct involving portions of the anterior limbs of the right internal and external capsule. There is patchy small vessel disease throughout the centra semiovale bilaterally. No acute infarct is evident. Vascular: There is no hyperdense vessel. There is calcification in each distal vertebral artery and carotid siphon region. Skull: Bony calvarium appears intact. Sinuses/Orbits: There is mucosal thickening in the right maxillary antrum. There is mucosal thickening in several ethmoid air cells. Visualized orbits appear symmetric bilaterally. Other: Visualized mastoid air cells are clear. IMPRESSION: Stable atrophy with periventricular small vessel disease. 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 No acute infarct evident. No mass or hemorrhage. There are multiple foci of arterial vascular calcification. There is paranasal sinus disease at several sites. Electronically Signed   By: Lowella Grip III M.D.   On: 11/15/2019 10:25   US RENAL  Result Date: 11/16/2019 CLINICAL  DATA:  Acute kidney injury. EXAM: RENAL / URINARY TRACT ULTRASOUND COMPLETE COMPARISON:  CT abdomen pelvis dated October 10, 2019. Renal ultrasound dated October 05, 2019. FINDINGS: Right Kidney: Renal measurements: 9.8 x 5.8 x 4.9 cm = volume: 145 mL. Increased echogenicity and cortical thinning. No mass or hydronephrosis visualized. Several calcifications measuring up to 1.1 cm. Left Kidney: Renal measurements: 9.3 x 4.9 x 4.4 cm = volume: 103 mL. Increased echogenicity and cortical thinning. No mass or hydronephrosis visualized. Several calcifications measuring up to 0.7 cm. Bladder: Appears normal for degree of bladder distention. Other: None. IMPRESSION: 1. Unchanged increased echogenicity and cortical thinning of the bilateral kidneys, consistent with medical renal disease. 2. Unchanged bilateral renal calcifications, likely vascular based on recent CT. No hydronephrosis. Electronically Signed   By: Titus Dubin M.D.   On: 11/16/2019 11:14   DG Chest Portable 1 View  Result Date: 11/15/2019 CLINICAL DATA:  Fever with lethargy EXAM: PORTABLE CHEST 1 VIEW COMPARISON:  October 10, 2019 FINDINGS: There is mild atelectatic change in the right mid lung and right base regions. The lungs elsewhere are clear. Heart is mildly enlarged with pulmonary vascularity normal. No adenopathy. No bone lesions. IMPRESSION: Scattered areas of atelectatic  change on the right. Lungs otherwise clear. Heart mildly enlarged. Electronically Signed   By: Lowella Grip III M.D.   On: 11/15/2019 10:34     Medications:   .  sodium bicarbonate (isotonic) infusion in sterile water 100 mL/hr at 11/16/19 1449   . aspirin  81 mg Oral Daily  . atorvastatin  40 mg Oral Daily  . carvedilol  12.5 mg Oral BID  . donepezil  10 mg Oral Daily  . heparin injection (subcutaneous)  5,000 Units Subcutaneous Q8H  . insulin aspart  0-15 Units Subcutaneous TID WC  . levothyroxine  50 mcg Oral Q0600   acetaminophen **OR** acetaminophen,  ondansetron **OR** ondansetron (ZOFRAN) IV  Assessment/ Plan:  [name] is a 74 y.o. black female with hypothyroidism, hypertension, hyperlipidemia, diabetes mellitus type II insulin dependent, who was admitted to [facility] on 11/15/2019 for Dehydration [K35.4] Metabolic acidosis [S56.8] AKI (acute kidney injury) ([place]) [N17.9]  1. Acute renal failure with hyperkalemia, hypernatremia, and metabolic acidosis on chronic kidney disease stage IIIB with proteinuria: baseline creatinine of 1.61, GFR of 31 on 10/14/19 Chronic kidney disease secondary to diabetic nephropathy Acute kidney injury most likely due to prerenal azotemia No IV contrast exposure.  - holding quinapril.  - IV fluids: sodium bicarbonate infusion No indication for dialysis at this time.   2. Hypertension: with preserved systolic function. Home regimen of amlodipine, carvedilol, and quinapril.  Carvedilol was restarted.   3. Diabetes mellitus type II with chronic kidney disease: insulin dependent.  Complicated by diabetic foot ulcer and charcot neuroarthropathy.   4. Hypernatremia: free water deficit of 2.2 liters - Continue sterile water in bicarb infusion.    LOS: 1 [name] [name] 8/10/20213:05 PM

## 2019-11-16 NOTE — Progress Notes (Signed)
   11/16/19 2013  Vitals  BP (!) 145/93  MAP (mmHg) 109  BP Location Left Arm  BP Method Automatic  Patient Position (if appropriate) Lying  Pulse Rate (!) 111  Level of Consciousness  Level of Consciousness Alert  MEWS COLOR  MEWS Score Color Yellow  MEWS Score  MEWS Temp 0  MEWS Systolic 0  MEWS Pulse 2  MEWS RR 0  MEWS LOC 0  MEWS Score 2     11/16/19 2041  Provider Notification  Provider Name/Title E. Stark Klein, NP  Date Provider Notified 11/16/19  Time Provider Notified 2045  Notification Type Page  Notification Reason Change in status  Response No new orders  Date of Provider Response 11/16/19  Time of Provider Response 2049

## 2019-11-16 NOTE — Progress Notes (Signed)
Based on ED assessment of LOC

## 2019-11-16 NOTE — TOC Initial Note (Signed)
Transition of Care [facility]) - Initial/Assessment Note    Patient Details  Name: [name] [name] MRN: [id number] Date of Birth: [birth year]  [facility] CM/SW Contact:    Shelbie Hutching, RN Phone Number: 11/16/2019, 4:18 PM  Clinical Narrative:                 Patient admitted to the hospital with acute kidney injury, nephrology is following.  Patient's son reported to the emergency room that the patient has been progressively weak and unable to do anything for herself at home.  RNCM spoke with patient at the bedside and asked her how things were going at home, she states "going okay".  RNCM talked with patient about going to skilled nursing but patient reports she does not want to go back to [facility] and is not sure right now if she would go to another facility.  Last admission to the hospital patient was discharged to [facility], patient reports she was there for a week.  Once patient was discharged from [facility] she was set up with [facility] for home health.   Message left with [facility] representative to let her know of patient admission.   Patient seems tired, RNCM will follow up with patient tomorrow and follow up with APS, there are notes that say that the home health RN made and APS report over the weekend.    Expected Discharge Plan: Skilled Nursing Facility Barriers to Discharge: Continued Medical Work up   Patient Goals and CMS Choice Patient states their goals for this hospitalization and ongoing recovery are:: to get well CMS Medicare.gov Compare Post Acute Care list provided to:: Patient Choice offered to / list presented to : Patient  Expected Discharge Plan and Services Expected Discharge Plan: [place]   Discharge Planning Services: CM Consult Post Acute Care Choice: [place] Living arrangements for the past 2 months: Single Family Home                                      Prior Living  Arrangements/Services Living arrangements for the past 2 months: Single Family Home Lives with:: Adult Children (lives with son) Patient language and need for interpreter reviewed:: Yes Do you feel safe going back to the place where you live?: Yes      Need for Family Participation in Patient Care: Yes (Comment) (kidney injury) Care giver support system in place?: Yes (comment) (son) Current home services: DME (wheelchair and walker) Criminal Activity/Legal Involvement Pertinent to Current Situation/Hospitalization: No - Comment as needed  Activities of Daily Living      Permission Sought/Granted Permission sought to share information with : Case Manager, Family Supports Permission granted to share information with : Yes, Verbal Permission Granted  Share Information with NAME: Montine Circle  Permission granted to share info w AGENCY: [employer]  Permission granted to share info w Relationship: Son     Emotional Assessment Appearance:: Appears stated age Attitude/Demeanor/Rapport: Engaged Affect (typically observed): Accepting Orientation: : Oriented to Self, Oriented to Place, Oriented to  Time, Oriented to Situation Alcohol / Substance Use: Not Applicable Psych Involvement: No (comment)  Admission diagnosis:  Dehydration [N56.2] Metabolic acidosis [Z30.8] AKI (acute kidney injury) ([place]) [N17.9] Patient Active Problem List   Diagnosis Date Noted  . AKI (acute kidney injury) ([place]) 11/15/2019  . Physical deconditioning 11/15/2019  . Aspiration pneumonia ([place]) 10/10/2019  . Hyperkalemia   .  Rib pain on left side   . Leukocytosis 10/04/2019  . Fall at home, initial encounter 10/04/2019  . Cellulitis of right leg 10/04/2019  . CKD stage 4 due to type 2 diabetes mellitus ([place]) 10/04/2019  . Acute kidney injury superimposed on CKD ([place])   . RUQ pain   . Hyperlipidemia   . Right leg pain 10/03/2019  . Diabetic ulcer of right foot associated with type 2 diabetes mellitus, with fat  layer exposed ([place]) 10/03/2019  . Cellulitis of right foot 10/03/2019  . HTN (hypertension) 10/03/2019  . Type 2 diabetes mellitus with stage 3 chronic kidney disease ([place]) 10/03/2019  . Hypothyroidism (acquired) 10/03/2019  . UTI (urinary tract infection) 10/03/2019  . Sepsis ([place]) 04/25/2018   PCP:  Marguerita Merles, MD Pharmacy:   [place], [place] - [place] [address] [place] [address] Phone: [phone] Fax: [phone]     Social Determinants of Health (SDOH) Interventions    Readmission Risk Interventions No flowsheet data found.

## 2019-11-16 NOTE — Progress Notes (Addendum)
PROGRESS NOTE    [name] [name]  VFI:[id number] DOB: [birth year] DOA: 11/15/2019 PCP: Marguerita Merles, MD   Brief Narrative: Taken from H&P [name] [name] is a 74 y.o. female with medical history significant for diabetes mellitus, hypertension, hypothyroidism and dyslipidemia who presented to the emergency room via EMS for evaluation of mental status changes and generalized pain.  EMS noticed temperature of 101.5.  She was afebrile in the ED.  Apparently APS was called by a home health nurse on her. Patient appears confused on arrival, with hyponatremia, hyperkalemia and metabolic acidosis with anion gap of 13.  AKI with history of CKD stage IV. Chest x-ray without any acute changes.  CT head without any acute infarct but did show chronic small vessel disease.  Subjective: Patient was having nausea and vomiting when seen today.  She was able to answer all the orientation questions.  Per patient she was having poor p.o. intake secondary to nausea for many days now.  Assessment & Plan:   Principal Problem:   AKI (acute kidney injury) ([place]) Active Problems:   HTN (hypertension)   Type 2 diabetes mellitus with stage 3 chronic kidney disease (HCC)   Hypothyroidism (acquired)   Hyperkalemia   Physical deconditioning  AKI with CKD stage IV/hyperkalemia/metabolic acidosis.  Creatinine around 3.6 this morning, baseline around 1.6-1.8.  Nephrology was consulted.  Her nausea and vomiting might be due to uremia.  Hyperkalemia can be due to renal failure/renal tubular acidosis. Continue to remain acidotic despite on bicarb infusion. -Nephrology will try switching her IV fluid with sterile water plus bicarb and watch her for another day before deciding about dialysis. -Renal ultrasound. -Continue to monitor. -Avoid nephrotoxins -CK levels pending  Hyperkalemia.  Potassium improved from admission, still mildly elevated. -Start her on [place].  Altered mental status.  Resolved.  Patient appears to  be at her baseline now.  Able to answer orientation questions appropriately.  Multifactorial due to electrolyte derangements which include hypernatremia, hyperkalemia and metabolic acidosis. No current source of infection.  Blood cultures negative.  COVID-19 negative.  Hypernatremia.  Likely secondary to dehydration. Bicarb infusion was switched with sterile water instead of normal saline. -Monitor sodium.  Type 2 diabetes mellitus with renal complications.  CBG mildly elevated. -Checking A1c -Continue sliding scale.  Hypothyroidism.  TSH at 2.078. -Continue home dose of Synthroid.  Hypertension.  Currently normotensive. -Restart home dose of carvedilol. -Holding amlodipine-can be restarted if needed.  Physical deconditioning.  Patient appears weak and deconditioned.  She requires assistance with all ADLs. -PT/OT evaluation.  Vascular dementia.  Per chart review son states that she has no prior diagnosis of dementia but currently very forgetful.  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 -Continue Aricept.  Objective: Vitals:   11/15/19 2125 11/16/19 0403 11/16/19 0807 11/16/19 1238  BP: 123/74 134/70 (!) 126/94 (!) 145/71  Pulse: 70 68 73 75  Resp: 18 18 18 18   Temp: 98.9 F (37.2 C) 97.9 F (36.6 C) 97.9 F (36.6 C) 97.6 F (36.4 C)  TempSrc: Oral Oral    SpO2: 97% 95% 96% 93%  Weight:      Height:        Intake/Output Summary (Last 24 hours) at 11/16/2019 1251 Last data filed at 11/16/2019 0958 Gross per 24 hour  Intake 240 ml  Output 200 ml  Net 40 ml   Filed Weights   11/15/19 0948  Weight: 97.1 kg    Examination:  General  exam: Appears calm and comfortable  Respiratory system: Clear to auscultation. Respiratory effort normal. Cardiovascular system: S1 & S2 heard, RRR. No JVD, murmurs, Gastrointestinal system: Soft, nontender, nondistended, bowel sounds positive. Central nervous system: Alert and oriented. No focal  neurological deficits. Extremities: No edema, no cyanosis, pulses intact and symmetrical. Psychiatry: Judgement and insight appear normal. Mood & affect appropriate.    DVT prophylaxis: Heparin Code Status: Full Family Communication: Son was updated on phone. Disposition Plan:  Status is: Inpatient  Remains inpatient appropriate because:Inpatient level of care appropriate due to severity of illness   Dispo: The patient is from: Home              Anticipated d/c is to: To be determine.              Anticipated d/c date is: 2 days              Patient currently is not medically stable to d/c.   Consultants:   Nephrology  Procedures:  Antimicrobials:   Data Reviewed: I have personally reviewed following labs and imaging studies  CBC: Recent Labs  Lab 11/15/19 0952 11/16/19 0628  WBC 10.3 12.5*  NEUTROABS 7.4  --   HGB 13.4 12.3  HCT 43.2 38.7  MCV 96.4 93.7  PLT 233 387   Basic Metabolic Panel: Recent Labs  Lab 11/15/19 1119 11/15/19 2002 11/16/19 0628  NA 148* 146* 147*  K 6.1* 5.8* 5.3*  CL 120* 117* 116*  CO2 15* 14* 16*  GLUCOSE 204* 311* 180*  BUN 92* 99* 96*  CREATININE 3.60* 3.81* 3.60*  CALCIUM 8.9 9.0 8.9  MG  --   --  3.5*  PHOS  --   --  4.6   GFR: Estimated Creatinine Clearance: 16.7 mL/min (A) (by C-G formula based on SCr of 3.6 mg/dL (H)). Liver Function Tests: Recent Labs  Lab 11/15/19 1119  AST 26  ALT 17  ALKPHOS 83  BILITOT 0.8  PROT 8.8*  ALBUMIN 3.4*   No results for input(s): LIPASE, AMYLASE in the last 168 hours. No results for input(s): AMMONIA in the last 168 hours. Coagulation Profile: No results for input(s): INR, PROTIME in the last 168 hours. Cardiac Enzymes: No results for input(s): CKTOTAL, CKMB, CKMBINDEX, TROPONINI in the last 168 hours. BNP (last 3 results) No results for input(s): PROBNP in the last 8760 hours. HbA1C: No results for input(s): HGBA1C in the last 72 hours. CBG: Recent Labs  Lab  11/15/19 2128 11/16/19 0830 11/16/19 1236  GLUCAP 282* 174* 158*   Lipid Profile: No results for input(s): CHOL, HDL, LDLCALC, TRIG, CHOLHDL, LDLDIRECT in the last 72 hours. Thyroid Function Tests: Recent Labs    11/16/19 0628  TSH 2.078   Anemia Panel: No results for input(s): VITAMINB12, FOLATE, FERRITIN, TIBC, IRON, RETICCTPCT in the last 72 hours. Sepsis Labs: Recent Labs  Lab 11/15/19 5643  LATICACIDVEN 1.7    Recent Results (from the past 240 hour(s))  SARS Coronavirus 2 by RT PCR (hospital order, performed in [facility] hospital lab) Nasopharyngeal Nasopharyngeal Swab     Status: None   Collection Time: 11/15/19  9:52 AM   Specimen: Nasopharyngeal Swab  Result Value Ref Range Status   SARS Coronavirus 2 NEGATIVE NEGATIVE Final    Comment: (NOTE) SARS-CoV-2 target nucleic acids are NOT DETECTED.  The SARS-CoV-2 RNA is generally detectable in upper and lower respiratory specimens during the acute phase of infection. The lowest concentration of SARS-CoV-2 viral copies this assay  can detect is 250 copies / mL. A negative result does not preclude SARS-CoV-2 infection and should not be used as the sole basis for treatment or other patient management decisions.  A negative result may occur with improper specimen collection / handling, submission of specimen other than nasopharyngeal swab, presence of viral mutation(s) within the areas targeted by this assay, and inadequate number of viral copies (<250 copies / mL). A negative result must be combined with clinical observations, patient history, and epidemiological information.  Fact Sheet for Patients:   StrictlyIdeas.no  Fact Sheet for Healthcare Providers: BankingDealers.co.za  This test is not yet approved or  cleared by the [place] FDA and has been authorized for detection and/or diagnosis of SARS-CoV-2 by FDA under an Emergency Use Authorization (EUA).  This  EUA will remain in effect (meaning this test can be used) for the duration of the COVID-19 declaration under Section 564(b)(1) of the Act, 21 U.S.C. section 360bbb-3(b)(1), unless the authorization is terminated or revoked sooner.  Performed at [facility], [address], [place], [place] [address]      Radiology Studies: CT Head Wo Contrast  Result Date: 11/15/2019 CLINICAL DATA:  Altered mental status and lethargy EXAM: CT HEAD WITHOUT CONTRAST TECHNIQUE: Contiguous axial images were obtained from the base of the skull through the vertex without intravenous contrast. COMPARISON:  October 10, 2019 FINDINGS: Brain: Mild diffuse atrophy is stable. There is no intracranial mass, hemorrhage, extra-axial fluid collection, or midline shift. There is evidence of a prior infarct involving the head of the caudate nucleus on the right. There are adjacent prior appearing infarct involving portions of the anterior limbs of the right internal and external capsule. There is patchy small vessel disease throughout the centra semiovale bilaterally. No acute infarct is evident. Vascular: There is no hyperdense vessel. There is calcification in each distal vertebral artery and carotid siphon region. Skull: Bony calvarium appears intact. Sinuses/Orbits: There is mucosal thickening in the right maxillary antrum. There is mucosal thickening in several ethmoid air cells. Visualized orbits appear symmetric bilaterally. Other: Visualized mastoid air cells are clear. IMPRESSION: Stable atrophy with periventricular small vessel disease. 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 No acute infarct evident. No mass or hemorrhage. There are multiple foci of arterial vascular calcification. There is paranasal sinus disease at several sites. Electronically Signed   By: Lowella Grip III M.D.   On: 11/15/2019 10:25   US RENAL  Result Date: 11/16/2019 CLINICAL DATA:  Acute kidney injury. EXAM: RENAL /  URINARY TRACT ULTRASOUND COMPLETE COMPARISON:  CT abdomen pelvis dated October 10, 2019. Renal ultrasound dated October 05, 2019. FINDINGS: Right Kidney: Renal measurements: 9.8 x 5.8 x 4.9 cm = volume: 145 mL. Increased echogenicity and cortical thinning. No mass or hydronephrosis visualized. Several calcifications measuring up to 1.1 cm. Left Kidney: Renal measurements: 9.3 x 4.9 x 4.4 cm = volume: 103 mL. Increased echogenicity and cortical thinning. No mass or hydronephrosis visualized. Several calcifications measuring up to 0.7 cm. Bladder: Appears normal for degree of bladder distention. Other: None. IMPRESSION: 1. Unchanged increased echogenicity and cortical thinning of the bilateral kidneys, consistent with medical renal disease. 2. Unchanged bilateral renal calcifications, likely vascular based on recent CT. No hydronephrosis. Electronically Signed   By: Titus Dubin M.D.   On: 11/16/2019 11:14   DG Chest Portable 1 View  Result Date: 11/15/2019 CLINICAL DATA:  Fever with lethargy EXAM: PORTABLE CHEST 1 VIEW COMPARISON:  October 10, 2019 FINDINGS:  There is mild atelectatic change in the right mid lung and right base regions. The lungs elsewhere are clear. Heart is mildly enlarged with pulmonary vascularity normal. No adenopathy. No bone lesions. IMPRESSION: Scattered areas of atelectatic change on the right. Lungs otherwise clear. Heart mildly enlarged. Electronically Signed   By: Lowella Grip III M.D.   On: 11/15/2019 10:34    Scheduled Meds: . aspirin  81 mg Oral Daily  . atorvastatin  40 mg Oral Daily  . donepezil  10 mg Oral Daily  . enoxaparin (LOVENOX) injection  30 mg Subcutaneous Q24H  . insulin aspart  0-15 Units Subcutaneous TID WC  . levothyroxine  50 mcg Oral Q0600   Continuous Infusions: .  sodium bicarbonate (isotonic) infusion in sterile water 100 mL/hr at 11/16/19 0251     LOS: 1 day   Time spent: 40 minutes.  Lorella Nimrod, MD [facility]  If 7PM-7AM, please  contact night-coverage [email or web address]  11/16/2019, 12:51 PM   This record has been created using Systems analyst. Errors have been sought and corrected,but may not always be located. Such creation errors do not reflect on the standard of care.

## 2019-11-17 ENCOUNTER — Other Ambulatory Visit: Payer: Self-pay

## 2019-11-17 LAB — HEMOGLOBIN A1C
Hgb A1c MFr Bld: 7 % — ABNORMAL HIGH (ref 4.8–5.6)
Mean Plasma Glucose: 154 mg/dL

## 2019-11-17 LAB — RENAL FUNCTION PANEL
Albumin: 3 g/dL — ABNORMAL LOW (ref 3.5–5.0)
Anion gap: 14 (ref 5–15)
BUN: 92 mg/dL — ABNORMAL HIGH (ref 8–23)
CO2: 25 mmol/L (ref 22–32)
Calcium: 8.1 mg/dL — ABNORMAL LOW (ref 8.9–10.3)
Chloride: 107 mmol/L (ref 98–111)
Creatinine, Ser: 2.96 mg/dL — ABNORMAL HIGH (ref 0.44–1.00)
GFR calc Af Amer: 17 mL/min — ABNORMAL LOW (ref >60)
GFR calc non Af Amer: 15 mL/min — ABNORMAL LOW (ref >60)
Glucose, Bld: 107 mg/dL — ABNORMAL HIGH (ref 70–99)
Phosphorus: 4.3 mg/dL (ref 2.5–4.6)
Potassium: 4.4 mmol/L (ref 3.5–5.1)
Sodium: 146 mmol/L — ABNORMAL HIGH (ref 135–145)

## 2019-11-17 LAB — CBC
HCT: 36.3 % (ref 36.0–46.0)
Hemoglobin: 11.6 g/dL — ABNORMAL LOW (ref 12.0–15.0)
MCH: 29.8 pg (ref 26.0–34.0)
MCHC: 32 g/dL (ref 30.0–36.0)
MCV: 93.3 fL (ref 80.0–100.0)
Platelets: 178 10*3/uL (ref 150–400)
RBC: 3.89 MIL/uL (ref 3.87–5.11)
RDW: 14.6 % (ref 11.5–15.5)
WBC: 9.1 10*3/uL (ref 4.0–10.5)
nRBC: 0 % (ref 0.0–0.2)

## 2019-11-17 LAB — CK: Total CK: 170 U/L (ref 38–234)

## 2019-11-17 LAB — GLUCOSE, CAPILLARY
Glucose-Capillary: 120 mg/dL — ABNORMAL HIGH (ref 70–99)
Glucose-Capillary: 213 mg/dL — ABNORMAL HIGH (ref 70–99)
Glucose-Capillary: 147 mg/dL — ABNORMAL HIGH (ref 70–99)
Glucose-Capillary: 108 mg/dL — ABNORMAL HIGH (ref 70–99)

## 2019-11-17 MED ORDER — POLYETHYLENE GLYCOL 3350 17 G PO PACK
17.0000 g | PACK | Freq: Every day | ORAL | Status: DC
  Administered 2019-11-17 – 2019-11-19 (×3): 17 g via ORAL
  Filled 2019-11-17 (×3): qty 1

## 2019-11-17 NOTE — NC FL2 (Signed)
[place] LEVEL OF CARE SCREENING TOOL     IDENTIFICATION  Patient Name: [name] [name] Birthdate: 06/06/1945 Sex: female Admission Date (Current Location): 11/15/2019  [place] and [place] Number:  Engineering geologist and Address:  [facility], [address], [place], [place] [address]      Provider Number: [id number]  Attending Physician Name and Address:  Lorella Nimrod, MD  Relative Name and Phone Number:  Ameyah Bangura (son) [phone]    Current Level of Care: Hospital Recommended Level of Care: [place] Prior Approval Number:    Date Approved/Denied:   PASRR Number: [id number]  Discharge Plan: SNF    Current Diagnoses: Patient Active Problem List   Diagnosis Date Noted  . AKI (acute kidney injury) ([place]) 11/15/2019  . Physical deconditioning 11/15/2019  . Aspiration pneumonia ([place]) 10/10/2019  . Hyperkalemia   . Rib pain on left side   . Leukocytosis 10/04/2019  . Fall at home, initial encounter 10/04/2019  . Cellulitis of right leg 10/04/2019  . CKD stage 4 due to type 2 diabetes mellitus ([place]) 10/04/2019  . Acute kidney injury superimposed on CKD ([place])   . RUQ pain   . Hyperlipidemia   . Right leg pain 10/03/2019  . Diabetic ulcer of right foot associated with type 2 diabetes mellitus, with fat layer exposed ([place]) 10/03/2019  . Cellulitis of right foot 10/03/2019  . HTN (hypertension) 10/03/2019  . Type 2 diabetes mellitus with stage 3 chronic kidney disease ([place]) 10/03/2019  . Hypothyroidism (acquired) 10/03/2019  . UTI (urinary tract infection) 10/03/2019  . Sepsis ([place]) 04/25/2018    Orientation RESPIRATION BLADDER Height & Weight     Self, Time, Place  Normal Incontinent Weight: 97.1 kg Height:  5\' 7"  (170.2 cm)  BEHAVIORAL SYMPTOMS/MOOD NEUROLOGICAL BOWEL NUTRITION STATUS      Continent Diet (see discharge summary)  AMBULATORY STATUS COMMUNICATION OF NEEDS Skin   Extensive Assist  Verbally Other (Comment) (MASD buttocks)                       Personal Care Assistance Level of Assistance  Bathing, Feeding, Dressing Bathing Assistance: Maximum assistance Feeding assistance: Limited assistance Dressing Assistance: Maximum assistance     Functional Limitations Info             SPECIAL CARE FACTORS FREQUENCY  PT (By licensed PT), OT (By licensed OT)     PT Frequency: 5 times per week OT Frequency: 5 times per week            Contractures Contractures Info: Not present    Additional Factors Info  Code Status, Allergies Code Status Info: Full Allergies Info: NKDA           Current Medications (11/17/2019):  This is the current hospital active medication list Current Facility-Administered Medications  Medication Dose Route Frequency Provider Last Rate Last Admin  . acetaminophen (TYLENOL) tablet 650 mg  650 mg Oral Q6H PRN Agbata, Tochukwu, MD       Or  . acetaminophen (TYLENOL) suppository 650 mg  650 mg Rectal Q6H PRN Agbata, Tochukwu, MD      . aspirin chewable tablet 81 mg  81 mg Oral Daily Agbata, Tochukwu, MD   81 mg at 11/16/19 0856  . atorvastatin (LIPITOR) tablet 40 mg  40 mg Oral Daily Agbata, Tochukwu, MD   40 mg at 11/16/19 0857  . carvedilol (COREG) tablet 12.5 mg  12.5 mg Oral BID Amin,  Sumayya, MD   12.5 mg at 11/16/19 2032  . donepezil (ARICEPT) tablet 10 mg  10 mg Oral Daily Agbata, Tochukwu, MD   10 mg at 11/16/19 0856  . heparin injection 5,000 Units  5,000 Units Subcutaneous Q8H Lorella Nimrod, MD   5,000 Units at 11/17/19 0527  . insulin aspart (novoLOG) injection 0-15 Units  0-15 Units Subcutaneous TID WC Agbata, Tochukwu, MD   2 Units at 11/16/19 1749  . levothyroxine (SYNTHROID) tablet 50 mcg  50 mcg Oral Q0600 Agbata, Tochukwu, MD   50 mcg at 11/17/19 0527  . ondansetron (ZOFRAN) tablet 4 mg  4 mg Oral Q6H PRN Agbata, Tochukwu, MD       Or  . ondansetron (ZOFRAN) injection 4 mg  4 mg Intravenous Q6H PRN Agbata, Tochukwu,  MD   4 mg at 11/16/19 1622  . sodium bicarbonate 150 mEq in sterile water 1,000 mL infusion   Intravenous Continuous Agbata, Tochukwu, MD 100 mL/hr at 11/16/19 1500 Rate Verify at 11/16/19 1500     Discharge Medications: Please see discharge summary for a list of discharge medications.  Relevant Imaging Results:  Relevant Lab Results:   Additional Information SSN: [id number]  Shelbie Hutching, RN

## 2019-11-17 NOTE — Progress Notes (Addendum)
PROGRESS NOTE    [name] [name]  CVE:[id number] DOB: February 28, 1946 DOA: 11/15/2019 PCP: [name] Merles, MD   Brief Narrative: Taken from H&P [name] [name] is a 74 y.o. female with medical history significant for diabetes mellitus, hypertension, hypothyroidism and dyslipidemia who presented to the emergency room via EMS for evaluation of mental status changes and generalized pain.  EMS noticed temperature of 101.5.  She was afebrile in the ED.  Apparently APS was called by a home health nurse on her. Patient appears confused on arrival, with hyponatremia, hyperkalemia and metabolic acidosis with anion gap of 13.  AKI with history of CKD stage IV. Chest x-ray without any acute changes.  CT head without any acute infarct but did show chronic small vessel disease.  Subjective: Patient has no new complaint today.  She wants to get out of bed.  Per patient she has no bowel movement for the past couple of weeks.  Denies any abdominal pain.  Assessment & Plan:   Principal Problem:   AKI (acute kidney injury) ([place]) Active Problems:   HTN (hypertension)   Type 2 diabetes mellitus with stage 3 chronic kidney disease ([place])   Hypothyroidism (acquired)   Hyperkalemia   Physical deconditioning  AKI with CKD stage IIIB /hyperkalemia/metabolic acidosis.  Hyperkalemia and acidosis resolved.  Creatinine improved to 2.96 this morning, baseline around 1.6-1.8.  Nephrology was consulted.  -Renal ultrasound-chronic medical disease, no hydronephrosis. -Per nephrology recommendations we will continue sterile water with bicarb for another day. -Continue to monitor. -Avoid nephrotoxins -CK levels-than normal limit.  Hyperkalemia.  Resolved.  Constipation.  Per patient she did not had any BM for the past 2 weeks. KUB without any acute abnormality, no stool burden noted. -Start her on regular bowel regimen. - one-time dose of soapsuds enema.  Altered mental status.  Resolved.  Patient appears to be at her  baseline now.  Able to answer orientation questions appropriately.  Multifactorial due to electrolyte derangements which include hypernatremia, hyperkalemia and metabolic acidosis. No current source of infection.  Blood cultures negative.  COVID-19 negative.  Hypernatremia.  Likely secondary to dehydration. Bicarb infusion was switched with sterile water instead of normal saline. -Monitor sodium.  Type 2 diabetes mellitus with renal complications.  CBG mildly elevated. -Checking A1c -Continue sliding scale.  Hypothyroidism.  TSH at 2.078. -Continue home dose of Synthroid.  Hypertension.  Currently normotensive. -Restart home dose of carvedilol. -Holding amlodipine-can be restarted if needed.  Physical deconditioning.  Patient appears weak and deconditioned.  She requires assistance with all ADLs. -PT/OT evaluation-SNF  There is a APS case open on her behalf.  According to them patient has no capacity to make decisions due to her dementia and son is unable to take care of her.  She will need a long-term facility.  TOC is working on it  Vascular dementia.  Per chart review son states that she has no prior diagnosis of dementia but currently very forgetful.  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 -Continue Aricept.  Objective: Vitals:   11/17/19 0500 11/17/19 0600 11/17/19 0753 11/17/19 1153  BP:   138/68 129/68  Pulse: 77 74 76 75  Resp: 14 14 20 18   Temp:   98 F (36.7 C) 98.7 F (37.1 C)  TempSrc:   Oral   SpO2: 91% 93% 96% 96%  Weight:      Height:        Intake/Output Summary (Last 24 hours) at 11/17/2019 [phone]  Last data filed at 11/17/2019 1230 Gross per 24 hour  Intake 720 ml  Output 1 ml  Net 719 ml   Filed Weights   11/15/19 0948  Weight: 97.1 kg    Examination:  General.  Developed elderly lady, in no acute distress. Pulmonary.  Lungs clear bilaterally, normal respiratory effort. CV.  Regular rate and rhythm, no JVD,  rub or murmur. Abdomen.  Soft, nontender, nondistended, BS positive. CNS.  Alert and oriented x3.  No focal neurologic deficit. Extremities.  No edema, no cyanosis, pulses intact and symmetrical. Psychiatry.  Judgment and insight appears normal.  DVT prophylaxis: Heparin Code Status: Full Family Communication: No family at bedside Disposition Plan:  Status is: Inpatient  Remains inpatient appropriate because:Inpatient level of care appropriate due to severity of illness   Dispo: The patient is from: Home              Anticipated d/c is to: To be determine.              Anticipated d/c date is: 2 days              Patient currently is not medically stable to d/c.   Consultants:   Nephrology  Procedures:  Antimicrobials:   Data Reviewed: I have personally reviewed following labs and imaging studies  CBC: Recent Labs  Lab 11/15/19 0952 11/16/19 0628 11/17/19 0415  WBC 10.3 12.5* 9.1  NEUTROABS 7.4  --   --   HGB 13.4 12.3 11.6*  HCT 43.2 38.7 36.3  MCV 96.4 93.7 93.3  PLT 233 201 485   Basic Metabolic Panel: Recent Labs  Lab 11/15/19 1119 11/15/19 2002 11/16/19 0628 11/17/19 0415  NA 148* 146* 147* 146*  K 6.1* 5.8* 5.3* 4.4  CL 120* 117* 116* 107  CO2 15* 14* 16* 25  GLUCOSE 204* 311* 180* 107*  BUN 92* 99* 96* 92*  CREATININE 3.60* 3.81* 3.60* 2.96*  CALCIUM 8.9 9.0 8.9 8.1*  MG  --   --  3.5*  --   PHOS  --   --  4.6 4.3   GFR: Estimated Creatinine Clearance: 20.3 mL/min (A) (by C-G formula based on SCr of 2.96 mg/dL (H)). Liver Function Tests: Recent Labs  Lab 11/15/19 1119 11/17/19 0415  AST 26  --   ALT 17  --   ALKPHOS 83  --   BILITOT 0.8  --   PROT 8.8*  --   ALBUMIN 3.4* 3.0*   No results for input(s): LIPASE, AMYLASE in the last 168 hours. No results for input(s): AMMONIA in the last 168 hours. Coagulation Profile: No results for input(s): INR, PROTIME in the last 168 hours. Cardiac Enzymes: Recent Labs  Lab 11/17/19 0415   CKTOTAL 170   BNP (last 3 results) No results for input(s): PROBNP in the last 8760 hours. HbA1C: Recent Labs    11/16/19 0628  HGBA1C 7.0*   CBG: Recent Labs  Lab 11/16/19 1236 11/16/19 1734 11/16/19 2102 11/17/19 0726 11/17/19 1152  GLUCAP 158* 138* 105* 120* 213*   Lipid Profile: No results for input(s): CHOL, HDL, LDLCALC, TRIG, CHOLHDL, LDLDIRECT in the last 72 hours. Thyroid Function Tests: Recent Labs    11/16/19 0628  TSH 2.078   Anemia Panel: No results for input(s): VITAMINB12, FOLATE, FERRITIN, TIBC, IRON, RETICCTPCT in the last 72 hours. Sepsis Labs: Recent Labs  Lab 11/15/19 0952  LATICACIDVEN 1.7    Recent Results (from the past 240 hour(s))  SARS Coronavirus 2 by  RT PCR (hospital order, performed in [facility] hospital lab) Nasopharyngeal Nasopharyngeal Swab     Status: None   Collection Time: 11/15/19  9:52 AM   Specimen: Nasopharyngeal Swab  Result Value Ref Range Status   SARS Coronavirus 2 NEGATIVE NEGATIVE Final    Comment: (NOTE) SARS-CoV-2 target nucleic acids are NOT DETECTED.  The SARS-CoV-2 RNA is generally detectable in upper and lower respiratory specimens during the acute phase of infection. The lowest concentration of SARS-CoV-2 viral copies this assay can detect is 250 copies / mL. A negative result does not preclude SARS-CoV-2 infection and should not be used as the sole basis for treatment or other patient management decisions.  A negative result may occur with improper specimen collection / handling, submission of specimen other than nasopharyngeal swab, presence of viral mutation(s) within the areas targeted by this assay, and inadequate number of viral copies (<250 copies / mL). A negative result must be combined with clinical observations, patient history, and epidemiological information.  Fact Sheet for Patients:   StrictlyIdeas.no  Fact Sheet for Healthcare  Providers: BankingDealers.co.za  This test is not yet approved or  cleared by the [place] FDA and has been authorized for detection and/or diagnosis of SARS-CoV-2 by FDA under an Emergency Use Authorization (EUA).  This EUA will remain in effect (meaning this test can be used) for the duration of the COVID-19 declaration under Section 564(b)(1) of the Act, 21 U.S.C. section 360bbb-3(b)(1), unless the authorization is terminated or revoked sooner.  Performed at [facility], [address] [place], [place] [address]      Radiology Studies: DG Abd 1 View  Result Date: 11/16/2019 CLINICAL DATA:  Vomiting EXAM: ABDOMEN - 1 VIEW COMPARISON:  CT 10/10/2019 FINDINGS: Nonobstructive bowel gas pattern. No organomegaly or free air. Renal vascular calcifications noted in the kidneys bilaterally as seen on prior CT. Degenerative changes in the lumbar spine and hips. No acute bony abnormality. IMPRESSION: No acute findings. Electronically Signed   By: Rolm Baptise M.D.   On: 11/16/2019 18:17   US RENAL  Result Date: 11/16/2019 CLINICAL DATA:  Acute kidney injury. EXAM: RENAL / URINARY TRACT ULTRASOUND COMPLETE COMPARISON:  CT abdomen pelvis dated October 10, 2019. Renal ultrasound dated October 05, 2019. FINDINGS: Right Kidney: Renal measurements: 9.8 x 5.8 x 4.9 cm = volume: 145 mL. Increased echogenicity and cortical thinning. No mass or hydronephrosis visualized. Several calcifications measuring up to 1.1 cm. Left Kidney: Renal measurements: 9.3 x 4.9 x 4.4 cm = volume: 103 mL. Increased echogenicity and cortical thinning. No mass or hydronephrosis visualized. Several calcifications measuring up to 0.7 cm. Bladder: Appears normal for degree of bladder distention. Other: None. IMPRESSION: 1. Unchanged increased echogenicity and cortical thinning of the bilateral kidneys, consistent with medical renal disease. 2. Unchanged bilateral renal calcifications, likely vascular  based on recent CT. No hydronephrosis. Electronically Signed   By: Titus Dubin M.D.   On: 11/16/2019 11:14    Scheduled Meds: . aspirin  81 mg Oral Daily  . atorvastatin  40 mg Oral Daily  . carvedilol  12.5 mg Oral BID  . donepezil  10 mg Oral Daily  . heparin injection (subcutaneous)  5,000 Units Subcutaneous Q8H  . insulin aspart  0-15 Units Subcutaneous TID WC  . levothyroxine  50 mcg Oral Q0600  . polyethylene glycol  17 g Oral Daily   Continuous Infusions: .  sodium bicarbonate (isotonic) infusion in sterile water 100 mL/hr at 11/16/19 1500  LOS: 2 days   Time spent: 40 minutes.  Lorella Nimrod, MD [facility]  If 7PM-7AM, please contact night-coverage [email or web address]  11/17/2019, 4:02 PM   This record has been created using Systems analyst. Errors have been sought and corrected,but may not always be located. Such creation errors do not reflect on the standard of care.

## 2019-11-17 NOTE — TOC Progression Note (Signed)
Transition of Care [facility]) - Progression Note    Patient Details  Name: ALBENA COMES MRN: [id number] Date of Birth: [birth year]  [facility]) CM/SW Contact  Shelbie Hutching, RN Phone Number: 11/17/2019, 10:58 AM  Clinical Narrative:    RNCM reached out to [facility] APS and received a call back from her assigned case worker, Delon Sacramento [phone].  Patient has an open investigation.  DSS reports that the patient does not have capacity to make her own decisions.  DSS is recommending skilled nursing facility placement, patient will need long term care.  Bed search has been started, [facility] and [facility] in [place] have offered beds.  Ubaldo Glassing reports that either facility will be fine.  RNCM will cont to follow and update Alexis on plan of care.    Expected Discharge Plan: [place] Barriers to Discharge: Continued Medical Work up  Expected Discharge Plan and Services Expected Discharge Plan: [place]   Discharge Planning Services: CM Consult Post Acute Care Choice: [place] Living arrangements for the past 2 months: Single Family Home                                       Social Determinants of Health (SDOH) Interventions    Readmission Risk Interventions No flowsheet data found.

## 2019-11-17 NOTE — Progress Notes (Signed)
[facility]  ROUNDING NOTE   Subjective:   Alert and responsive this morning. Eating breakfast.   Bicarb infusion at 147mL/hr  Objective:  Vital signs in last 24 hours:  Temp:  [97.5 F (36.4 C)-98 F (36.7 C)] 98 F (36.7 C) (08/11 0753) Pulse Rate:  [74-113] 76 (08/11 0753) Resp:  [14-20] 20 (08/11 0753) BP: (117-145)/(64-101) 138/68 (08/11 0753) SpO2:  [91 %-98 %] 96 % (08/11 0753)  Weight change:  Filed Weights   11/15/19 0948  Weight: 97.1 kg    Intake/Output: I/O last 3 completed shifts: In: 1454.9 [P.O.:240; I.V.:1214.9] Out: 203 [Emesis/NG output:203]   Intake/Output this shift:  Total I/O In: 240 [P.O.:240] Out: -   Physical Exam: General: NAD,   Head: +moist oral mucosal membranes  Eyes: Anicteric, PERRL  Neck: Supple, trachea midline  Lungs:  Clear to auscultation  Heart: Regular rate and rhythm  Abdomen:  Soft, nontender,   Extremities:  no peripheral edema.   Neurologic: Nonfocal, moving all four extremities  Skin: No lesions        Basic Metabolic Panel: Recent Labs  Lab 11/15/19 1119 11/15/19 1119 11/15/19 2002 11/16/19 0628 11/17/19 0415  NA 148*  --  146* 147* 146*  K 6.1*  --  5.8* 5.3* 4.4  CL 120*  --  117* 116* 107  CO2 15*  --  14* 16* 25  GLUCOSE 204*  --  311* 180* 107*  BUN 92*  --  99* 96* 92*  CREATININE 3.60*  --  3.81* 3.60* 2.96*  CALCIUM 8.9   < > 9.0 8.9 8.1*  MG  --   --   --  3.5*  --   PHOS  --   --   --  4.6 4.3   < > = values in this interval not displayed.    Liver Function Tests: Recent Labs  Lab 11/15/19 1119 11/17/19 0415  AST 26  --   ALT 17  --   ALKPHOS 83  --   BILITOT 0.8  --   PROT 8.8*  --   ALBUMIN 3.4* 3.0*   No results for input(s): LIPASE, AMYLASE in the last 168 hours. No results for input(s): AMMONIA in the last 168 hours.  CBC: Recent Labs  Lab 11/15/19 0952 11/16/19 0628 11/17/19 0415  WBC 10.3 12.5* 9.1  NEUTROABS 7.4  --   --   HGB 13.4 12.3 11.6*  HCT  43.2 38.7 36.3  MCV 96.4 93.7 93.3  PLT 233 201 178    Cardiac Enzymes: Recent Labs  Lab 11/17/19 0415  CKTOTAL 170    BNP: Invalid input(s): POCBNP  CBG: Recent Labs  Lab 11/16/19 0830 11/16/19 1236 11/16/19 1734 11/16/19 2102 11/17/19 0726  GLUCAP 174* 158* 138* 105* 120*    Microbiology: Results for orders placed or performed during the hospital encounter of 11/15/19  SARS Coronavirus 2 by RT PCR (hospital order, performed in [place] hospital lab) Nasopharyngeal Nasopharyngeal Swab     Status: None   Collection Time: 11/15/19  9:52 AM   Specimen: Nasopharyngeal Swab  Result Value Ref Range Status   SARS Coronavirus 2 NEGATIVE NEGATIVE Final    Comment: (NOTE) SARS-CoV-2 target nucleic acids are NOT DETECTED.  The SARS-CoV-2 RNA is generally detectable in upper and lower respiratory specimens during the acute phase of infection. The lowest concentration of SARS-CoV-2 viral copies this assay can detect is 250 copies / mL. A negative result does not preclude SARS-CoV-2 infection and should  not be used as the sole basis for treatment or other patient management decisions.  A negative result may occur with improper specimen collection / handling, submission of specimen other than nasopharyngeal swab, presence of viral mutation(s) within the areas targeted by this assay, and inadequate number of viral copies (<250 copies / mL). A negative result must be combined with clinical observations, patient history, and epidemiological information.  Fact Sheet for Patients:   StrictlyIdeas.no  Fact Sheet for Healthcare Providers: BankingDealers.co.za  This test is not yet approved or  cleared by the [place] FDA and has been authorized for detection and/or diagnosis of SARS-CoV-2 by FDA under an Emergency Use Authorization (EUA).  This EUA will remain in effect (meaning this test can be used) for the duration of  the COVID-19 declaration under Section 564(b)(1) of the Act, 21 U.S.C. section 360bbb-3(b)(1), unless the authorization is terminated or revoked sooner.  Performed at [facility], [address] [place], [place] [address]     Coagulation Studies: No results for input(s): LABPROT, INR in the last 72 hours.  Urinalysis: Recent Labs    11/15/19 0952  COLORURINE YELLOW*  LABSPEC 1.015  PHURINE 5.0  GLUCOSEU NEGATIVE  HGBUR NEGATIVE  BILIRUBINUR NEGATIVE  KETONESUR NEGATIVE  PROTEINUR NEGATIVE  NITRITE NEGATIVE  LEUKOCYTESUR NEGATIVE      Imaging: DG Abd 1 View  Result Date: 11/16/2019 CLINICAL DATA:  Vomiting EXAM: ABDOMEN - 1 VIEW COMPARISON:  CT 10/10/2019 FINDINGS: Nonobstructive bowel gas pattern. No organomegaly or free air. Renal vascular calcifications noted in the kidneys bilaterally as seen on prior CT. Degenerative changes in the lumbar spine and hips. No acute bony abnormality. IMPRESSION: No acute findings. Electronically Signed   By: Rolm Baptise M.D.   On: 11/16/2019 18:17   US RENAL  Result Date: 11/16/2019 CLINICAL DATA:  Acute kidney injury. EXAM: RENAL / URINARY TRACT ULTRASOUND COMPLETE COMPARISON:  CT abdomen pelvis dated October 10, 2019. Renal ultrasound dated October 05, 2019. FINDINGS: Right Kidney: Renal measurements: 9.8 x 5.8 x 4.9 cm = volume: 145 mL. Increased echogenicity and cortical thinning. No mass or hydronephrosis visualized. Several calcifications measuring up to 1.1 cm. Left Kidney: Renal measurements: 9.3 x 4.9 x 4.4 cm = volume: 103 mL. Increased echogenicity and cortical thinning. No mass or hydronephrosis visualized. Several calcifications measuring up to 0.7 cm. Bladder: Appears normal for degree of bladder distention. Other: None. IMPRESSION: 1. Unchanged increased echogenicity and cortical thinning of the bilateral kidneys, consistent with medical renal disease. 2. Unchanged bilateral renal calcifications, likely vascular based on  recent CT. No hydronephrosis. Electronically Signed   By: Titus Dubin M.D.   On: 11/16/2019 11:14     Medications:   .  sodium bicarbonate (isotonic) infusion in sterile water 100 mL/hr at 11/16/19 1500   . aspirin  81 mg Oral Daily  . atorvastatin  40 mg Oral Daily  . carvedilol  12.5 mg Oral BID  . donepezil  10 mg Oral Daily  . heparin injection (subcutaneous)  5,000 Units Subcutaneous Q8H  . insulin aspart  0-15 Units Subcutaneous TID WC  . levothyroxine  50 mcg Oral Q0600  . polyethylene glycol  17 g Oral Daily   acetaminophen **OR** acetaminophen, ondansetron **OR** ondansetron (ZOFRAN) IV  Assessment/ Plan:  Ms. JONICA BICKHART is a 74 y.o. black female with hypothyroidism, hypertension, hyperlipidemia, diabetes mellitus type II insulin dependent, who was admitted to [facility] on 11/15/2019 for Dehydration [I50.2] Metabolic acidosis [D74.1] AKI (acute kidney injury) (  [place]) [N17.9]  1. Acute renal failure with hyperkalemia, hypernatremia, and metabolic acidosis on chronic kidney disease stage IIIB with proteinuria: baseline creatinine of 1.61, GFR of 31 on 10/14/19 Chronic kidney disease secondary to diabetic nephropathy Acute kidney injury most likely due to prerenal azotemia No IV contrast exposure.  No indication for dialysis at this time.  - holding quinapril.  - Continue IV fluids: sodium bicarbonate infusion  2. Hypertension: with preserved systolic function. 138/68. Home regimen of amlodipine, carvedilol, and quinapril.  Carvedilol was restarted.   3. Diabetes mellitus type II with chronic kidney disease: insulin dependent.  Complicated by diabetic foot ulcer and charcot neuroarthropathy.   4. Hypernatremia:  - Continue sterile water in bicarb infusion.  - Encourage PO intake.    LOS: 2 [place] Kilcrease 8/11/202111:50 AM

## 2019-11-17 NOTE — Progress Notes (Signed)
sse given.  Mod stool from this enema. tol  Fair. Pt says she  Felt better.

## 2019-11-18 LAB — CBC
HCT: 34.1 % — ABNORMAL LOW (ref 36.0–46.0)
Hemoglobin: 11.3 g/dL — ABNORMAL LOW (ref 12.0–15.0)
MCH: 30.1 pg (ref 26.0–34.0)
MCHC: 33.1 g/dL (ref 30.0–36.0)
MCV: 90.9 fL (ref 80.0–100.0)
Platelets: 167 10*3/uL (ref 150–400)
RBC: 3.75 MIL/uL — ABNORMAL LOW (ref 3.87–5.11)
RDW: 14.6 % (ref 11.5–15.5)
WBC: 8.6 10*3/uL (ref 4.0–10.5)
nRBC: 0 % (ref 0.0–0.2)

## 2019-11-18 LAB — RENAL FUNCTION PANEL
Albumin: 2.7 g/dL — ABNORMAL LOW (ref 3.5–5.0)
Anion gap: 13 (ref 5–15)
BUN: 74 mg/dL — ABNORMAL HIGH (ref 8–23)
CO2: 30 mmol/L (ref 22–32)
Calcium: 7.9 mg/dL — ABNORMAL LOW (ref 8.9–10.3)
Chloride: 100 mmol/L (ref 98–111)
Creatinine, Ser: 2.7 mg/dL — ABNORMAL HIGH (ref 0.44–1.00)
GFR calc Af Amer: 19 mL/min — ABNORMAL LOW (ref >60)
GFR calc non Af Amer: 17 mL/min — ABNORMAL LOW (ref >60)
Glucose, Bld: 121 mg/dL — ABNORMAL HIGH (ref 70–99)
Phosphorus: 3.4 mg/dL (ref 2.5–4.6)
Potassium: 3.9 mmol/L (ref 3.5–5.1)
Sodium: 143 mmol/L (ref 135–145)

## 2019-11-18 LAB — GLUCOSE, CAPILLARY
Glucose-Capillary: 135 mg/dL — ABNORMAL HIGH (ref 70–99)
Glucose-Capillary: 136 mg/dL — ABNORMAL HIGH (ref 70–99)
Glucose-Capillary: 122 mg/dL — ABNORMAL HIGH (ref 70–99)
Glucose-Capillary: 102 mg/dL — ABNORMAL HIGH (ref 70–99)

## 2019-11-18 MED ORDER — AMLODIPINE BESYLATE 10 MG PO TABS
10.0000 mg | ORAL_TABLET | Freq: Every day | ORAL | Status: DC
  Administered 2019-11-18 – 2019-11-19 (×2): 10 mg via ORAL
  Filled 2019-11-18 (×2): qty 1

## 2019-11-18 NOTE — Care Management Important Message (Signed)
Important Message  Patient Details  Name: [name] [name] MRN: [id number] Date of Birth: 04/10/1945   Medicare Important Message Given:  Yes     Dannette Barbara 11/18/2019, 1:19 PM

## 2019-11-18 NOTE — Progress Notes (Signed)
[facility]  ROUNDING NOTE   Subjective:   Eating breakfast when seen and examined this morning. No specific complaints.   Objective:  Vital signs in last 24 hours:  Temp:  [98.1 F (36.7 C)-98.8 F (37.1 C)] 98.1 F (36.7 C) (08/12 0909) Pulse Rate:  [63-76] 76 (08/12 0909) Resp:  [18-20] 18 (08/12 0909) BP: (129-152)/(64-80) 149/66 (08/12 0909) SpO2:  [91 %-96 %] 94 % (08/12 0909)  Weight change:  Filed Weights   11/15/19 0948  Weight: 97.1 kg    Intake/Output: I/O last 3 completed shifts: In: 720 [P.O.:720] Out: 900 [Urine:900]   Intake/Output this shift:  No intake/output data recorded.  Physical Exam: General: NAD,   Head: +moist oral mucosal membranes  Eyes: Anicteric, PERRL  Neck: Supple, trachea midline  Lungs:  Clear to auscultation  Heart: Regular rate and rhythm  Abdomen:  Soft, nontender,   Extremities:  no peripheral edema.   Neurologic: Nonfocal, moving all four extremities  Skin: No lesions        Basic Metabolic Panel: Recent Labs  Lab 11/15/19 1119 11/15/19 1119 11/15/19 2002 11/15/19 2002 11/16/19 0628 11/17/19 0415 11/18/19 0408  NA 148*  --  146*  --  147* 146* 143  K 6.1*  --  5.8*  --  5.3* 4.4 3.9  CL 120*  --  117*  --  116* 107 100  CO2 15*  --  14*  --  16* 25 30  GLUCOSE 204*  --  311*  --  180* 107* 121*  BUN 92*  --  99*  --  96* 92* 74*  CREATININE 3.60*  --  3.81*  --  3.60* 2.96* 2.70*  CALCIUM 8.9   < > 9.0   < > 8.9 8.1* 7.9*  MG  --   --   --   --  3.5*  --   --   PHOS  --   --   --   --  4.6 4.3 3.4   < > = values in this interval not displayed.    Liver Function Tests: Recent Labs  Lab 11/15/19 1119 11/17/19 0415 11/18/19 0408  AST 26  --   --   ALT 17  --   --   ALKPHOS 83  --   --   BILITOT 0.8  --   --   PROT 8.8*  --   --   ALBUMIN 3.4* 3.0* 2.7*   No results for input(s): LIPASE, AMYLASE in the last 168 hours. No results for input(s): AMMONIA in the last 168  hours.  CBC: Recent Labs  Lab 11/15/19 0952 11/16/19 0628 11/17/19 0415 11/18/19 0408  WBC 10.3 12.5* 9.1 8.6  NEUTROABS 7.4  --   --   --   HGB 13.4 12.3 11.6* 11.3*  HCT 43.2 38.7 36.3 34.1*  MCV 96.4 93.7 93.3 90.9  PLT 233 201 178 167    Cardiac Enzymes: Recent Labs  Lab 11/17/19 0415  CKTOTAL 170    BNP: Invalid input(s): POCBNP  CBG: Recent Labs  Lab 11/17/19 0726 11/17/19 1152 11/17/19 1630 11/17/19 2109 11/18/19 0732  GLUCAP 120* 213* 147* 108* 135*    Microbiology: Results for orders placed or performed during the hospital encounter of 11/15/19  SARS Coronavirus 2 by RT PCR (hospital order, performed in [facility] hospital lab) Nasopharyngeal Nasopharyngeal Swab     Status: None   Collection Time: 11/15/19  9:52 AM   Specimen: Nasopharyngeal Swab  Result Value  Ref Range Status   SARS Coronavirus 2 NEGATIVE NEGATIVE Final    Comment: (NOTE) SARS-CoV-2 target nucleic acids are NOT DETECTED.  The SARS-CoV-2 RNA is generally detectable in upper and lower respiratory specimens during the acute phase of infection. The lowest concentration of SARS-CoV-2 viral copies this assay can detect is 250 copies / mL. A negative result does not preclude SARS-CoV-2 infection and should not be used as the sole basis for treatment or other patient management decisions.  A negative result may occur with improper specimen collection / handling, submission of specimen other than nasopharyngeal swab, presence of viral mutation(s) within the areas targeted by this assay, and inadequate number of viral copies (<250 copies / mL). A negative result must be combined with clinical observations, patient history, and epidemiological information.  Fact Sheet for Patients:   StrictlyIdeas.no  Fact Sheet for Healthcare Providers: BankingDealers.co.za  This test is not yet approved or  cleared by the [place] FDA and has been  authorized for detection and/or diagnosis of SARS-CoV-2 by FDA under an Emergency Use Authorization (EUA).  This EUA will remain in effect (meaning this test can be used) for the duration of the COVID-19 declaration under Section 564(b)(1) of the Act, 21 U.S.C. section 360bbb-3(b)(1), unless the authorization is terminated or revoked sooner.  Performed at [facility], [address] [place],  [address]     Coagulation Studies: No results for input(s): LABPROT, INR in the last 72 hours.  Urinalysis: No results for input(s): COLORURINE, LABSPEC, PHURINE, GLUCOSEU, HGBUR, BILIRUBINUR, KETONESUR, PROTEINUR, UROBILINOGEN, NITRITE, LEUKOCYTESUR in the last 72 hours.  Invalid input(s): APPERANCEUR    Imaging: DG Abd 1 View  Result Date: 11/16/2019 CLINICAL DATA:  Vomiting EXAM: ABDOMEN - 1 VIEW COMPARISON:  CT 10/10/2019 FINDINGS: Nonobstructive bowel gas pattern. No organomegaly or free air. Renal vascular calcifications noted in the kidneys bilaterally as seen on prior CT. Degenerative changes in the lumbar spine and hips. No acute bony abnormality. IMPRESSION: No acute findings. Electronically Signed   By: Rolm Baptise M.D.   On: 11/16/2019 18:17     Medications:    . aspirin  81 mg Oral Daily  . atorvastatin  40 mg Oral Daily  . carvedilol  12.5 mg Oral BID  . donepezil  10 mg Oral Daily  . heparin injection (subcutaneous)  5,000 Units Subcutaneous Q8H  . insulin aspart  0-15 Units Subcutaneous TID WC  . levothyroxine  50 mcg Oral Q0600  . polyethylene glycol  17 g Oral Daily   acetaminophen **OR** acetaminophen, ondansetron **OR** ondansetron (ZOFRAN) IV  Assessment/ Plan:  [name] is a 74 y.o. black female with hypothyroidism, hypertension, hyperlipidemia, diabetes mellitus type II insulin dependent, who was admitted to [facility] on 11/15/2019 for Dehydration [Z02.5] Metabolic acidosis [E52.7] AKI (acute kidney injury) ([place]) [N17.9]  1. Acute renal  failure with hyperkalemia, hypernatremia, and metabolic acidosis on chronic kidney disease stage IIIB with proteinuria: baseline creatinine of 1.61, GFR of 31 on 10/14/19 Chronic kidney disease secondary to diabetic nephropathy Acute kidney injury most likely due to prerenal azotemia No IV contrast exposure.  No indication for dialysis at this time.  - holding quinapril.  - Stopped IV fluids.  2. Hypertension: with preserved systolic function. Home regimen of amlodipine, carvedilol, and quinapril.  Continue carvedilol   3. Diabetes mellitus type II with chronic kidney disease: insulin dependent.  Complicated by diabetic foot ulcer and charcot neuroarthropathy.   4. Hypernatremia: improved with IV fluids and  PO intake.    LOS: 3 Dametria Tuzzolino 8/12/202111:38 AM

## 2019-11-18 NOTE — TOC Progression Note (Signed)
Transition of Care [facility]) - Progression Note    Patient Details  Name: [name] [name] MRN: [id number] Date of Birth: May 06, 1945  Transition of Care [facility]) CM/SW Contact  Shelbie Hutching, RN Phone Number: 11/18/2019, 3:23 PM  Clinical Narrative:    Insurance authorization approved for patient to go to [facility] ID [id number], approved for 5 days starting tomorrow 7/13.  Next review is Aug 17th.  Care coordinator assigned to review is Darnelle Bos. Patient's son Montine Circle has been updated on plan of care and is aware of DC to [facility] tomorrow.   Delon [place] updated that patient will discharge to [facility] tomorrow.    Expected Discharge Plan: [place] Barriers to Discharge: Continued Medical Work up  Expected Discharge Plan and Services Expected Discharge Plan: [place]   Discharge Planning Services: CM Consult Post Acute Care Choice: [place] Living arrangements for the past 2 months: Single Family Home                                       Social Determinants of Health (SDOH) Interventions    Readmission Risk Interventions No flowsheet data found.

## 2019-11-18 NOTE — Progress Notes (Signed)
Physical Therapy Treatment Patient Details Name: [name] [name] MRN: [id number] DOB: [birth year] Today's Date: 11/18/2019    History of Present Illness Pt is a 74 y.o. female w/ PMH of HTN, DM, hypothyroidism, dyslipidemia, CHF, CKD stage 3. 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    PT Comments    Pt found in supine; generally lethargic and stated last night was a bad night and her neck is now stiff due to being crooked all night. Son in room for today's session, provided further history and indicated that pt was ambulatory and generally independent up until ~8mo ago. Son stated that over the past month pt has had steadily declining mobility. Pt willing to perform supine level exercises this afternoon, required max-A from therapist to perform SLR and hip abd w/ minimal pt participation. Pt unwilling to participate w/ bed mobility today. Pt given max encouragement multiple times as well as education regarding the physiological benefits of movement. Pt continued to refuse bed mobility, refused to attempt supine-sit. Pt's neck was repositioned w/ pillows in a more aligned position; pt unable to bring neck to neutral. Pt will benefit from PT services in a SNF setting upon discharge to safely address deficits listed in patient problem list for decreased caregiver assistance and eventual return to PLOF.    Follow Up Recommendations  SNF;Supervision/Assistance - 24 hour     Equipment Recommendations  Other (comment) (tbd at next venue of care)    Recommendations for Other Services       Precautions / Restrictions Precautions Precautions: Fall Restrictions Weight Bearing Restrictions: No    Mobility  Bed Mobility Overal bed mobility:  (Pt unwilling to participate w/ bed mobility this afternoon)                Transfers                     Ambulation/Gait                 Stairs             Wheelchair Mobility    Modified Rankin (Stroke Patients Only)       Balance                                            Cognition Arousal/Alertness: Lethargic Behavior During Therapy: Anxious Overall Cognitive Status: Difficult to assess                                 General Comments: A&O. Lethargic and disinterested in moving, son present and stated she has been this way for ~59mo      Exercises Total Joint Exercises Ankle Circles/Pumps: AROM;Strengthening;Both;10 reps Hip ABduction/ADduction: AAROM;Strengthening;Both;10 reps Straight Leg Raises: AAROM;Strengthening;Both;10 reps;15 reps Other Exercises Other Exercises: education provided on the physiological benefits of exercise    General Comments        Pertinent Vitals/Pain Pain Assessment: Faces Faces Pain Scale: Hurts little more Pain Location: stiff neck Pain Descriptors / Indicators: Discomfort;Sore;Tightness Pain Intervention(s): Limited activity within patient's tolerance;Repositioned;Monitored during session    Home Living                      Prior  Function            PT Goals (current goals can now be found in the care plan section) Progress towards PT goals: Not progressing toward goals - comment    Frequency    Min 2X/week      PT Plan Current plan remains appropriate    Co-evaluation              AM-PAC PT "6 Clicks" Mobility   Outcome Measure  Help needed turning from your back to your side while in a flat bed without using bedrails?: Total Help needed moving from lying on your back to sitting on the side of a flat bed without using bedrails?: Total Help needed moving to and from a bed to a chair (including a wheelchair)?: Total Help needed standing up from a chair using your arms (e.g., wheelchair or bedside chair)?: Total Help needed to walk in hospital room?:  Total Help needed climbing 3-5 steps with a railing? : Total 6 Click Score: 6    End of Session   Activity Tolerance: Patient limited by fatigue Patient left: in bed;with call bell/phone within reach;with bed alarm set;with family/visitor present Nurse Communication: Mobility status; pt would benefit from easy push callbell  PT Visit Diagnosis: Unsteadiness on feet (R26.81);Muscle weakness (generalized) (M62.81);History of falling (Z91.81);Difficulty in walking, not elsewhere classified (R26.2)     Time: 9450-3888 PT Time Calculation (min) (ACUTE ONLY): 17 min  Charges:                        Herbert Moors SPT 11/18/19, 3:44 PM

## 2019-11-18 NOTE — Progress Notes (Addendum)
PROGRESS NOTE    [name] [name]  GMW:[id number] DOB: 1945-06-11 DOA: 11/15/2019 PCP: Marguerita Merles, MD   Brief Narrative: Taken from H&P [name] [name] is a 74 y.o. female with medical history significant for diabetes mellitus, hypertension, hypothyroidism and dyslipidemia who presented to the emergency room via EMS for evaluation of mental status changes and generalized pain.  EMS noticed temperature of 101.5.  She was afebrile in the ED.  Apparently APS was called by a home health nurse on her. Patient appears confused on arrival, with hyponatremia, hyperkalemia and metabolic acidosis with anion gap of 13.  AKI with history of CKD stage IV. Chest x-ray without any acute changes.  CT head without any acute infarct but did show chronic small vessel disease.  Subjective: Patient has no new complaint today. She was crooked on one side of bed and wants someone to straighten her out so she can eat her breakfast.  Assessment & Plan:   Principal Problem:   AKI (acute kidney injury) ([place]) Active Problems:   HTN (hypertension)   Type 2 diabetes mellitus with stage 3 chronic kidney disease ([place])   Hypothyroidism (acquired)   Hyperkalemia   Physical deconditioning  AKI with CKD stage IIIB /hyperkalemia/metabolic acidosis.  Hyperkalemia and acidosis resolved.  Creatinine continue to improve 2.70 currently , baseline around 1.6-1.8.  Nephrology was consulted.  -Discontinue bicarb infusion. -Renal ultrasound-chronic medical disease, no hydronephrosis. -Continue to monitor. -Avoid nephrotoxins -CK levels-within normal limits.  Hyperkalemia.  Resolved.  Constipation.  Had a BM after getting enema yesterday. Per patient she did not had any BM for the past 2 weeks. KUB without any acute abnormality, no stool burden noted. -Start her on regular bowel regimen. - one-time dose of soapsuds enema.  Altered mental status.  Resolved.  Patient appears to be at her baseline now.  Able to answer  orientation questions appropriately.  Multifactorial due to electrolyte derangements which include hypernatremia, hyperkalemia and metabolic acidosis. No current source of infection.  Blood cultures negative.  COVID-19 negative.  Hypernatremia.  Resolved  Likely secondary to dehydration. -Continue to monitor  Type 2 diabetes mellitus with renal complications.  CBG mildly elevated. -Checking A1c -Continue sliding scale.  Hypothyroidism.  TSH at 2.078. -Continue home dose of Synthroid.  Hypertension.  Blood pressure mildly elevated today. -Continue home dose of carvedilol. -Restart home amlodipine  Physical deconditioning.  Patient appears weak and deconditioned.  She requires assistance with all ADLs. -PT/OT evaluation-SNF  There is a APS case open on her behalf.  According to them patient has no capacity to make decisions due to her dementia and son is unable to take care of her.  She will need a long-term facility.  TOC is working on it  Vascular dementia.  Per chart review son states that she has no prior diagnosis of dementia but currently very forgetful.  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 -Continue Aricept.  Objective: Vitals:   11/17/19 2342 11/18/19 0404 11/18/19 0909 11/18/19 1220  BP: (!) 142/68 (!) 143/64 (!) 149/66 (!) 151/65  Pulse: 63 74 76 72  Resp: 18 18 18 17   Temp: 98.5 F (36.9 C) 98.3 F (36.8 C) 98.1 F (36.7 C) 98.7 F (37.1 C)  TempSrc:    Oral  SpO2: 92% 91% 94% 92%  Weight:      Height:        Intake/Output Summary (Last 24 hours) at 11/18/2019 1429 Last data filed at 11/18/2019 1256  Gross per 24 hour  Intake -  Output 1500 ml  Net -1500 ml   Filed Weights   11/15/19 0948  Weight: 97.1 kg    Examination:  General.  Well-developed elderly lady, patient in no acute distress. Pulmonary.  Lungs clear bilaterally, normal respiratory effort. CV.  Regular rate and rhythm, no JVD, rub or murmur.  Abdomen.  Soft, nontender, nondistended, BS positive. CNS.  Alert and oriented x3.  No focal neurologic deficit. Extremities.  No edema, no cyanosis, pulses intact and symmetrical. Psychiatry.  Judgment and insight appears normal.  DVT prophylaxis: Heparin Code Status: Full Family Communication: Son was updated on phone Disposition Plan:  Status is: Inpatient  Remains inpatient appropriate because:Inpatient level of care appropriate due to severity of illness   Dispo: The patient is from: Home              Anticipated d/c is to: To be determine.              Anticipated d/c date is: 2 days              Patient currently is medically stable.  TOC is working on placement.   Consultants:   Nephrology  Procedures:  Antimicrobials:   Data Reviewed: I have personally reviewed following labs and imaging studies  CBC: Recent Labs  Lab 11/15/19 0952 11/16/19 0628 11/17/19 0415 11/18/19 0408  WBC 10.3 12.5* 9.1 8.6  NEUTROABS 7.4  --   --   --   HGB 13.4 12.3 11.6* 11.3*  HCT 43.2 38.7 36.3 34.1*  MCV 96.4 93.7 93.3 90.9  PLT 233 201 178 557   Basic Metabolic Panel: Recent Labs  Lab 11/15/19 1119 11/15/19 2002 11/16/19 0628 11/17/19 0415 11/18/19 0408  NA 148* 146* 147* 146* 143  K 6.1* 5.8* 5.3* 4.4 3.9  CL 120* 117* 116* 107 100  CO2 15* 14* 16* 25 30  GLUCOSE 204* 311* 180* 107* 121*  BUN 92* 99* 96* 92* 74*  CREATININE 3.60* 3.81* 3.60* 2.96* 2.70*  CALCIUM 8.9 9.0 8.9 8.1* 7.9*  MG  --   --  3.5*  --   --   PHOS  --   --  4.6 4.3 3.4   GFR: Estimated Creatinine Clearance: 22.2 mL/min (A) (by C-G formula based on SCr of 2.7 mg/dL (H)). Liver Function Tests: Recent Labs  Lab 11/15/19 1119 11/17/19 0415 11/18/19 0408  AST 26  --   --   ALT 17  --   --   ALKPHOS 83  --   --   BILITOT 0.8  --   --   PROT 8.8*  --   --   ALBUMIN 3.4* 3.0* 2.7*   No results for input(s): LIPASE, AMYLASE in the last 168 hours. No results for input(s): AMMONIA in the  last 168 hours. Coagulation Profile: No results for input(s): INR, PROTIME in the last 168 hours. Cardiac Enzymes: Recent Labs  Lab 11/17/19 0415  CKTOTAL 170   BNP (last 3 results) No results for input(s): PROBNP in the last 8760 hours. HbA1C: Recent Labs    11/16/19 0628  HGBA1C 7.0*   CBG: Recent Labs  Lab 11/17/19 1152 11/17/19 1630 11/17/19 2109 11/18/19 0732 11/18/19 1210  GLUCAP 213* 147* 108* 135* 136*   Lipid Profile: No results for input(s): CHOL, HDL, LDLCALC, TRIG, CHOLHDL, LDLDIRECT in the last 72 hours. Thyroid Function Tests: Recent Labs    11/16/19 0628  TSH 2.078   Anemia Panel: No  results for input(s): VITAMINB12, FOLATE, FERRITIN, TIBC, IRON, RETICCTPCT in the last 72 hours. Sepsis Labs: Recent Labs  Lab 11/15/19 8144  LATICACIDVEN 1.7    Recent Results (from the past 240 hour(s))  SARS Coronavirus 2 by RT PCR (hospital order, performed in [facility] hospital lab) Nasopharyngeal Nasopharyngeal Swab     Status: None   Collection Time: 11/15/19  9:52 AM   Specimen: Nasopharyngeal Swab  Result Value Ref Range Status   SARS Coronavirus 2 NEGATIVE NEGATIVE Final    Comment: (NOTE) SARS-CoV-2 target nucleic acids are NOT DETECTED.  The SARS-CoV-2 RNA is generally detectable in upper and lower respiratory specimens during the acute phase of infection. The lowest concentration of SARS-CoV-2 viral copies this assay can detect is 250 copies / mL. A negative result does not preclude SARS-CoV-2 infection and should not be used as the sole basis for treatment or other patient management decisions.  A negative result may occur with improper specimen collection / handling, submission of specimen other than nasopharyngeal swab, presence of viral mutation(s) within the areas targeted by this assay, and inadequate number of viral copies (<250 copies / mL). A negative result must be combined with clinical observations, patient history, and  epidemiological information.  Fact Sheet for Patients:   StrictlyIdeas.no  Fact Sheet for Healthcare Providers: BankingDealers.co.za  This test is not yet approved or  cleared by the [place] FDA and has been authorized for detection and/or diagnosis of SARS-CoV-2 by FDA under an Emergency Use Authorization (EUA).  This EUA will remain in effect (meaning this test can be used) for the duration of the COVID-19 declaration under Section 564(b)(1) of the Act, 21 U.S.C. section 360bbb-3(b)(1), unless the authorization is terminated or revoked sooner.  Performed at [facility], [address] [place], [place] [address]      Radiology Studies: DG Abd 1 View  Result Date: 11/16/2019 CLINICAL DATA:  Vomiting EXAM: ABDOMEN - 1 VIEW COMPARISON:  CT 10/10/2019 FINDINGS: Nonobstructive bowel gas pattern. No organomegaly or free air. Renal vascular calcifications noted in the kidneys bilaterally as seen on prior CT. Degenerative changes in the lumbar spine and hips. No acute bony abnormality. IMPRESSION: No acute findings. Electronically Signed   By: Rolm Baptise M.D.   On: 11/16/2019 18:17    Scheduled Meds: . aspirin  81 mg Oral Daily  . atorvastatin  40 mg Oral Daily  . carvedilol  12.5 mg Oral BID  . donepezil  10 mg Oral Daily  . heparin injection (subcutaneous)  5,000 Units Subcutaneous Q8H  . insulin aspart  0-15 Units Subcutaneous TID WC  . levothyroxine  50 mcg Oral Q0600  . polyethylene glycol  17 g Oral Daily   Continuous Infusions:    LOS: 3 days   Time spent: 35 minutes.  Lorella Nimrod, MD [facility]  If 7PM-7AM, please contact night-coverage [email or web address]  11/18/2019, 2:29 PM   This record has been created using Systems analyst. Errors have been sought and corrected,but may not always be located. Such creation errors do not reflect on the standard of care.

## 2019-11-18 NOTE — TOC Progression Note (Signed)
Transition of Care [facility]) - Progression Note    Patient Details  Name: JANILLE DRAUGHON MRN: [id number] Date of Birth: 1945-09-10  [facility]) CM/SW Contact  Shelbie Hutching, RN Phone Number: 11/18/2019, 9:29 AM  Clinical Narrative:     Ubaldo Glassing with DSS [facility].  Bed offer from [facility] accepted.    Expected Discharge Plan: [place] Barriers to Discharge: Continued Medical Work up  Expected Discharge Plan and Services Expected Discharge Plan: [place]   Discharge Planning Services: CM Consult Post Acute Care Choice: [place] Living arrangements for the past 2 months: Single Family Home                                       Social Determinants of Health (SDOH) Interventions    Readmission Risk Interventions No flowsheet data found.

## 2019-11-19 LAB — RENAL FUNCTION PANEL
Albumin: 2.8 g/dL — ABNORMAL LOW (ref 3.5–5.0)
Anion gap: 15 (ref 5–15)
BUN: 58 mg/dL — ABNORMAL HIGH (ref 8–23)
CO2: 28 mmol/L (ref 22–32)
Calcium: 8.6 mg/dL — ABNORMAL LOW (ref 8.9–10.3)
Chloride: 100 mmol/L (ref 98–111)
Creatinine, Ser: 2.45 mg/dL — ABNORMAL HIGH (ref 0.44–1.00)
GFR calc Af Amer: 22 mL/min — ABNORMAL LOW (ref >60)
GFR calc non Af Amer: 19 mL/min — ABNORMAL LOW (ref >60)
Glucose, Bld: 85 mg/dL (ref 70–99)
Phosphorus: 3.8 mg/dL (ref 2.5–4.6)
Potassium: 4.1 mmol/L (ref 3.5–5.1)
Sodium: 143 mmol/L (ref 135–145)

## 2019-11-19 LAB — CBC
HCT: 34.9 % — ABNORMAL LOW (ref 36.0–46.0)
Hemoglobin: 11.2 g/dL — ABNORMAL LOW (ref 12.0–15.0)
MCH: 30.5 pg (ref 26.0–34.0)
MCHC: 32.1 g/dL (ref 30.0–36.0)
MCV: 95.1 fL (ref 80.0–100.0)
Platelets: 172 10*3/uL (ref 150–400)
RBC: 3.67 MIL/uL — ABNORMAL LOW (ref 3.87–5.11)
RDW: 14.6 % (ref 11.5–15.5)
WBC: 9.3 10*3/uL (ref 4.0–10.5)
nRBC: 0 % (ref 0.0–0.2)

## 2019-11-19 LAB — GLUCOSE, CAPILLARY: Glucose-Capillary: 86 mg/dL (ref 70–99)

## 2019-11-19 MED ORDER — POLYETHYLENE GLYCOL 3350 17 G PO PACK: 17.0000 g | PACK | Freq: Every day | ORAL | 0 refills | Status: DC

## 2019-11-19 NOTE — TOC Transition Note (Signed)
Transition of Care [facility]) - CM/SW Discharge Note   Patient Details  Name: [name] [name] MRN: [id number] Date of Birth: March 07, 1946  Transition of Care [facility]) CM/SW Contact:  Shelbie Hutching, RN Phone Number: 11/19/2019, 10:27 AM   Clinical Narrative:    Patient is medically clear for discharge to SNF today.  Patient is going to [facility] room 501.  Patient and son, Montine Circle, are aware of discharge today.  Bedside RN will call report to [phone].  This RNCM will arrange EMS transport to [facility].    Final next level of care: Skilled Nursing Facility Barriers to Discharge: Barriers Resolved   Patient Goals and CMS Choice Patient states their goals for this hospitalization and ongoing recovery are:: to get well CMS Medicare.gov Compare Post Acute Care list provided to:: Patient Choice offered to / list presented to : Patient, Adult Children  Discharge Placement   Existing PASRR number confirmed : 11/17/19          Patient chooses bed at: [facility] [place] Patient to be transferred to facility by: [facility] Name of family member notified: Derrick Patient and family notified of of transfer: 11/19/19  Discharge Plan and Services   Discharge Planning Services: CM Consult Post Acute Care Choice: [place]                               Social Determinants of Health (SDOH) Interventions     Readmission Risk Interventions No flowsheet data found.

## 2019-11-19 NOTE — Progress Notes (Signed)
Patient discharged to [facility] per MD order. Report called to [place] at facility. EMS used for transport.

## 2019-11-19 NOTE — Progress Notes (Signed)
[facility]  ROUNDING NOTE   Subjective:   Patient states she is feeling much better today.   Creatinine 2.45 (2.7)  Objective:  Vital signs in last 24 hours:  Temp:  [98 F (36.7 C)-98.7 F (37.1 C)] 98.5 F (36.9 C) (08/13 0741) Pulse Rate:  [65-72] 72 (08/13 0741) Resp:  [14-17] 14 (08/13 0741) BP: (131-152)/(54-72) 152/67 (08/13 0741) SpO2:  [91 %-94 %] 92 % (08/13 0741)  Weight change:  Filed Weights   11/15/19 0948  Weight: 97.1 kg    Intake/Output: I/O last 3 completed shifts: In: 240 [P.O.:240] Out: 900 [Urine:900]   Intake/Output this shift:  No intake/output data recorded.  Physical Exam: General: NAD,   Head: +moist oral mucosal membranes  Eyes: Anicteric, PERRL  Neck: Supple, trachea midline  Lungs:  Clear to auscultation  Heart: Regular rate and rhythm  Abdomen:  Soft, nontender,   Extremities:  no peripheral edema.   Neurologic: Nonfocal, moving all four extremities  Skin: No lesions        Basic Metabolic Panel: Recent Labs  Lab 11/15/19 2002 11/15/19 2002 11/16/19 4098 11/16/19 1191 11/17/19 0415 11/18/19 0408 11/19/19 0422  NA 146*  --  147*  --  146* 143 143  K 5.8*  --  5.3*  --  4.4 3.9 4.1  CL 117*  --  116*  --  107 100 100  CO2 14*  --  16*  --  25 30 28   GLUCOSE 311*  --  180*  --  107* 121* 85  BUN 99*  --  96*  --  92* 74* 58*  CREATININE 3.81*  --  3.60*  --  2.96* 2.70* 2.45*  CALCIUM 9.0   < > 8.9   < > 8.1* 7.9* 8.6*  MG  --   --  3.5*  --   --   --   --   PHOS  --   --  4.6  --  4.3 3.4 3.8   < > = values in this interval not displayed.    Liver Function Tests: Recent Labs  Lab 11/15/19 1119 11/17/19 0415 11/18/19 0408 11/19/19 0422  AST 26  --   --   --   ALT 17  --   --   --   ALKPHOS 83  --   --   --   BILITOT 0.8  --   --   --   PROT 8.8*  --   --   --   ALBUMIN 3.4* 3.0* 2.7* 2.8*   No results for input(s): LIPASE, AMYLASE in the last 168 hours. No results for input(s): AMMONIA in  the last 168 hours.  CBC: Recent Labs  Lab 11/15/19 0952 11/16/19 0628 11/17/19 0415 11/18/19 0408 11/19/19 0422  WBC 10.3 12.5* 9.1 8.6 9.3  NEUTROABS 7.4  --   --   --   --   HGB 13.4 12.3 11.6* 11.3* 11.2*  HCT 43.2 38.7 36.3 34.1* 34.9*  MCV 96.4 93.7 93.3 90.9 95.1  PLT 233 201 178 167 172    Cardiac Enzymes: Recent Labs  Lab 11/17/19 0415  CKTOTAL 170    BNP: Invalid input(s): POCBNP  CBG: Recent Labs  Lab 11/18/19 0732 11/18/19 1210 11/18/19 1524 11/18/19 1944 11/19/19 0850  GLUCAP 135* 136* 122* 102* 68    Microbiology: Results for orders placed or performed during the hospital encounter of 11/15/19  SARS Coronavirus 2 by RT PCR (hospital order, performed in [place]  hospital lab) Nasopharyngeal Nasopharyngeal Swab     Status: None   Collection Time: 11/15/19  9:52 AM   Specimen: Nasopharyngeal Swab  Result Value Ref Range Status   SARS Coronavirus 2 NEGATIVE NEGATIVE Final    Comment: (NOTE) SARS-CoV-2 target nucleic acids are NOT DETECTED.  The SARS-CoV-2 RNA is generally detectable in upper and lower respiratory specimens during the acute phase of infection. The lowest concentration of SARS-CoV-2 viral copies this assay can detect is 250 copies / mL. A negative result does not preclude SARS-CoV-2 infection and should not be used as the sole basis for treatment or other patient management decisions.  A negative result may occur with improper specimen collection / handling, submission of specimen other than nasopharyngeal swab, presence of viral mutation(s) within the areas targeted by this assay, and inadequate number of viral copies (<250 copies / mL). A negative result must be combined with clinical observations, patient history, and epidemiological information.  Fact Sheet for Patients:   StrictlyIdeas.no  Fact Sheet for Healthcare Providers: BankingDealers.co.za  This test is not yet  approved or  cleared by the [place] FDA and has been authorized for detection and/or diagnosis of SARS-CoV-2 by FDA under an Emergency Use Authorization (EUA).  This EUA will remain in effect (meaning this test can be used) for the duration of the COVID-19 declaration under Section 564(b)(1) of the Act, 21 U.S.C. section 360bbb-3(b)(1), unless the authorization is terminated or revoked sooner.  Performed at [facility], [address] [place], [place] [address]     Coagulation Studies: No results for input(s): LABPROT, INR in the last 72 hours.  Urinalysis: No results for input(s): COLORURINE, LABSPEC, PHURINE, GLUCOSEU, HGBUR, BILIRUBINUR, KETONESUR, PROTEINUR, UROBILINOGEN, NITRITE, LEUKOCYTESUR in the last 72 hours.  Invalid input(s): APPERANCEUR    Imaging: No results found.   Medications:    . amLODipine  10 mg Oral Daily  . aspirin  81 mg Oral Daily  . atorvastatin  40 mg Oral Daily  . carvedilol  12.5 mg Oral BID  . donepezil  10 mg Oral Daily  . heparin injection (subcutaneous)  5,000 Units Subcutaneous Q8H  . insulin aspart  0-15 Units Subcutaneous TID WC  . levothyroxine  50 mcg Oral Q0600  . polyethylene glycol  17 g Oral Daily   acetaminophen **OR** acetaminophen, ondansetron **OR** ondansetron (ZOFRAN) IV  Assessment/ Plan:  Ms. ARCENIA SCARBRO is a 74 y.o. black female with hypothyroidism, hypertension, hyperlipidemia, diabetes mellitus type II insulin dependent, who was admitted to [facility] on 11/15/2019 for Dehydration [D32.2] Metabolic acidosis [G25.4] AKI (acute kidney injury) ([place]) [N17.9]  1. Acute renal failure with hyperkalemia, hypernatremia, and metabolic acidosis on chronic kidney disease stage IIIB with proteinuria: baseline creatinine of 1.61, GFR of 31 on 10/14/19 Chronic kidney disease secondary to diabetic nephropathy Acute kidney injury most likely due to prerenal azotemia No IV contrast exposure.  No indication for dialysis at  this time.  - holding quinapril.   2. Hypertension: with preserved systolic function. Home regimen of amlodipine, carvedilol, and quinapril.  Continue carvedilol   3. Diabetes mellitus type II with chronic kidney disease: insulin dependent.  Complicated by diabetic foot ulcer and charcot neuroarthropathy.   Will need hospital follow up with nephrology.    LOS: 4 Veda Arrellano 8/13/202111:48 AM

## 2019-11-19 NOTE — Discharge Summary (Signed)
Physician Discharge Summary  ARBELL WYCOFF ZJI:[id number] DOB: [birth year] DOA: 11/15/2019  PCP: Marguerita Merles, MD  Admit date: 11/15/2019 Discharge date: 11/19/2019  Admitted From: Home Disposition: SNF   Recommendations for Outpatient Follow-up:  1. Follow up with PCP in 1-2 weeks 2. Please obtain BMP/CBC in one week 3. Please follow up on the following pending results:None  Home Health:No Equipment/Devices: Rolling walker Discharge Condition: Stable CODE STATUS: Full Diet recommendation: Heart Healthy / Carb Modified   Brief/Interim Summary: DAWNMARIE BREON a 74 y.o.femalewith medical history significant fordiabetes mellitus, hypertension, hypothyroidism and dyslipidemia who presented to the emergency room via EMS for evaluation of mental status changesand generalized pain.  EMS noticed temperature of 101.5.  She was afebrile in the ED.  Apparently [facility]PS was called by a home health nurse on her. Patient appears confused on arrival, with hyponatremia, hyperkalemia and metabolic acidosis with anion gap of 13.  AKI with history of CKD stage IV. Chest x-ray without any acute changes.  CT head without any acute infarct but did show chronic small vessel disease.  Electrolyte derangements which include hyperkalemia, hyponatremia and acidosis resolved with IV fluid.  Creatinine improving, it was 2.47 on the day of discharge with baseline around 1.6-1.8.  Nephrology was consulted and she will follow up with them as an outpatient.  Patient did had renal ultrasound which shows chronic medical disease, no hydronephrosis.  It was thought to be due to poor p.o. intake and severe dehydration.  Mental status improved and she is at her baseline.  No source of infection.  Blood cultures negative, COVID-19 negative.  Most likely multiple electrolyte derangements secondary to severe dehydration.  Patient has severe physical deconditioning and unable to take care of herself at home.  She lives with her  son who works.  PT/OT evaluated him and recommended SNF placement before returning home.  She might need a long-term facility.  She is being discharged to SNF for rehab.  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  No prior diagnosis of dementia.  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  Patient will continue with rest of her home meds and follow-up with her primary care providers.  Discharge Diagnoses:  Principal Problem:   AKI (acute kidney injury) ([place]) Active Problems:   HTN (hypertension)   Type 2 diabetes mellitus with stage 3 chronic kidney disease ([place])   Hypothyroidism (acquired)   Hyperkalemia   Physical deconditioning   Discharge Instructions  Discharge Instructions    Diet - low sodium heart healthy   Complete by: As directed    Discharge wound care:   Complete by: As directed    As mentioned above   Increase activity slowly   Complete by: As directed      Allergies as of 11/19/2019   No Known Allergies     Medication List    STOP taking these medications   insulin aspart protamine- aspart (70-30) 100 UNIT/ML injection Commonly known as: NOVOLOG MIX 70/30     TAKE these medications   amLODipine 10 MG tablet Commonly known as: NORVASC Take 1 tablet by mouth daily.   ASPIRIN 81 PO Take by mouth daily.   atorvastatin 40 MG tablet Commonly known as: LIPITOR Take 1 tablet by mouth at bedtime.   carvedilol 25 MG tablet Commonly known as: COREG Take 1 tablet by mouth 2 (two) times daily.   donepezil 10 MG tablet Commonly known as: ARICEPT Take 10 mg by  mouth at bedtime.   polyethylene glycol 17 g packet Commonly known as: MIRALAX / GLYCOLAX Take 17 g by mouth daily.   Synthroid 50 MCG tablet Generic drug: levothyroxine Take 1 tablet by mouth daily.   Tyler Aas FlexTouch 100 UNIT/ML FlexTouch Pen Generic drug: insulin degludec Inject 80 Units into the skin at bedtime.            Discharge Care  Instructions  (From admission, onward)         Start     Ordered   11/19/19 0000  Discharge wound care:       Comments: As mentioned above   11/19/19 0955          Contact information for follow-up providers    Marguerita Merles, MD. Schedule an appointment as soon as possible for a visit.   Specialty: Family Medicine Contact information: [place] [address] [phone]        Lavonia Dana, MD. Schedule an appointment as soon as possible for a visit.   Specialty: Nephrology Contact information: [address] [place] [address] [phone]            Contact information for after-discharge care    Destination    [facility] Preferred [facility]F .   Service: Skilled Nursing Contact information: [address] [place] [phone]                 No Known Allergies  Consultations:  Nephrology  Procedures/Studies: DG Abd 1 View  Result Date: 11/16/2019 CLINICAL DATA:  Vomiting EXAM: ABDOMEN - 1 VIEW COMPARISON:  CT 10/10/2019 FINDINGS: Nonobstructive bowel gas pattern. No organomegaly or free air. Renal vascular calcifications noted in the kidneys bilaterally as seen on prior CT. Degenerative changes in the lumbar spine and hips. No acute bony abnormality. IMPRESSION: No acute findings. Electronically Signed   By: Rolm Baptise M.D.   On: 11/16/2019 18:17   CT Head Wo Contrast  Result Date: 11/15/2019 CLINICAL DATA:  Altered mental status and lethargy EXAM: CT HEAD WITHOUT CONTRAST TECHNIQUE: Contiguous axial images were obtained from the base of the skull through the vertex without intravenous contrast. COMPARISON:  October 10, 2019 FINDINGS: Brain: Mild diffuse atrophy is stable. There is no intracranial mass, hemorrhage, extra-axial fluid collection, or midline shift. There is evidence of a prior infarct involving the head of the caudate nucleus on the right. There are adjacent  prior appearing infarct involving portions of the anterior limbs of the right internal and external capsule. There is patchy small vessel disease throughout the centra semiovale bilaterally. No acute infarct is evident. Vascular: There is no hyperdense vessel. There is calcification in each distal vertebral artery and carotid siphon region. Skull: Bony calvarium appears intact. Sinuses/Orbits: There is mucosal thickening in the right maxillary antrum. There is mucosal thickening in several ethmoid air cells. Visualized orbits appear symmetric bilaterally. Other: Visualized mastoid air cells are clear. IMPRESSION: Stable atrophy with periventricular small vessel disease. 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 No acute infarct evident. No mass or hemorrhage. There are multiple foci of arterial vascular calcification. There is paranasal sinus disease at several sites. Electronically Signed   By: Lowella Grip III M.D.   On: 11/15/2019 10:25   US RENAL  Result Date: 11/16/2019 CLINICAL DATA:  Acute kidney injury. EXAM: RENAL / URINARY TRACT ULTRASOUND COMPLETE COMPARISON:  CT abdomen pelvis dated October 10, 2019. Renal ultrasound dated  October 05, 2019. FINDINGS: Right Kidney: Renal measurements: 9.8 x 5.8 x 4.9 cm = volume: 145 mL. Increased echogenicity and cortical thinning. No mass or hydronephrosis visualized. Several calcifications measuring up to 1.1 cm. Left Kidney: Renal measurements: 9.3 x 4.9 x 4.4 cm = volume: 103 mL. Increased echogenicity and cortical thinning. No mass or hydronephrosis visualized. Several calcifications measuring up to 0.7 cm. Bladder: Appears normal for degree of bladder distention. Other: None. IMPRESSION: 1. Unchanged increased echogenicity and cortical thinning of the bilateral kidneys, consistent with medical renal disease. 2. Unchanged bilateral renal calcifications, likely vascular based on recent CT. No hydronephrosis. Electronically Signed   By: Titus Dubin M.D.   On: 11/16/2019 11:14   DG Chest Portable 1 View  Result Date: 11/15/2019 CLINICAL DATA:  Fever with lethargy EXAM: PORTABLE CHEST 1 VIEW COMPARISON:  October 10, 2019 FINDINGS: There is mild atelectatic change in the right mid lung and right base regions. The lungs elsewhere are clear. Heart is mildly enlarged with pulmonary vascularity normal. No adenopathy. No bone lesions. IMPRESSION: Scattered areas of atelectatic change on the right. Lungs otherwise clear. Heart mildly enlarged. Electronically Signed   By: Lowella Grip III M.D.   On: 11/15/2019 10:34    Subjective: Patent has no new complaints. Wants to change her position statng her butt is hurting.  Discharge Exam: Vitals:   11/19/19 0550 11/19/19 0741  BP: (!) 143/66 (!) 152/67  Pulse: 65 72  Resp: 17 14  Temp: 98 F (36.7 C) 98.5 F (36.9 C)  SpO2: 91% 92%   Vitals:   11/18/19 1946 11/19/19 0008 11/19/19 0550 11/19/19 0741  BP: (!) 134/54 131/60 (!) 143/66 (!) 152/67  Pulse:  65 65 72  Resp:  17 17 14   Temp: 98.3 F (36.8 C) 98.1 F (36.7 C) 98 F (36.7 C) 98.5 F (36.9 C)  TempSrc: Oral     SpO2: 92% 94% 91% 92%  Weight:      Height:        General: Pt is alert, awake, not in acute distress Cardiovascular: RRR, S1/S2 +, no rubs, no gallops Respiratory: CTA bilaterally, no wheezing, no rhonchi Abdominal: Soft, NT, ND, bowel sounds + Extremities: no edema, no cyanosis   The results of significant diagnostics from this hospitalization (including imaging, microbiology, ancillary and laboratory) are listed below for reference.    Microbiology: Recent Results (from the past 240 hour(s))  SARS Coronavirus 2 by RT PCR (hospital order, performed in [facility] hospital lab) Nasopharyngeal Nasopharyngeal Swab     Status: None   Collection Time: 11/15/19  9:52 AM   Specimen: Nasopharyngeal Swab  Result Value Ref Range Status   SARS Coronavirus 2 NEGATIVE NEGATIVE Final    Comment: (NOTE) SARS-CoV-2  target nucleic acids are NOT DETECTED.  The SARS-CoV-2 RNA is generally detectable in upper and lower respiratory specimens during the acute phase of infection. The lowest concentration of SARS-CoV-2 viral copies this assay can detect is 250 copies / mL. A negative result does not preclude SARS-CoV-2 infection and should not be used as the sole basis for treatment or other patient management decisions.  A negative result may occur with improper specimen collection / handling, submission of specimen other than nasopharyngeal swab, presence of viral mutation(s) within the areas targeted by this assay, and inadequate number of viral copies (<250 copies / mL). A negative result must be combined with clinical observations, patient history, and epidemiological information.  Fact Sheet for Patients:   StrictlyIdeas.no  Fact Sheet for Healthcare Providers: BankingDealers.co.za  This test is not yet approved or  cleared by the [place] FDA and has been authorized for detection and/or diagnosis of SARS-CoV-2 by FDA under an Emergency Use Authorization (EUA).  This EUA will remain in effect (meaning this test can be used) for the duration of the COVID-19 declaration under Section 564(b)(1) of the Act, 21 U.S.C. section 360bbb-3(b)(1), unless the authorization is terminated or revoked sooner.  Performed at [facility], [address] [place], [place] [address]      Labs: BNP (last 3 results) Recent Labs    10/10/19 1211  BNP 989.2*   Basic Metabolic Panel: Recent Labs  Lab 11/15/19 2002 11/16/19 0628 11/17/19 0415 11/18/19 0408 11/19/19 0422  NA 146* 147* 146* 143 143  K 5.8* 5.3* 4.4 3.9 4.1  CL 117* 116* 107 100 100  CO2 14* 16* 25 30 28   GLUCOSE 311* 180* 107* 121* 85  BUN 99* 96* 92* 74* 58*  CREATININE 3.81* 3.60* 2.96* 2.70* 2.45*  CALCIUM 9.0 8.9 8.1* 7.9* 8.6*  MG  --  3.5*  --   --   --   PHOS  --   4.6 4.3 3.4 3.8   Liver Function Tests: Recent Labs  Lab 11/15/19 1119 11/17/19 0415 11/18/19 0408 11/19/19 0422  AST 26  --   --   --   ALT 17  --   --   --   ALKPHOS 83  --   --   --   BILITOT 0.8  --   --   --   PROT 8.8*  --   --   --   ALBUMIN 3.4* 3.0* 2.7* 2.8*   No results for input(s): LIPASE, AMYLASE in the last 168 hours. No results for input(s): AMMONIA in the last 168 hours. CBC: Recent Labs  Lab 11/15/19 0952 11/16/19 0628 11/17/19 0415 11/18/19 0408 11/19/19 0422  WBC 10.3 12.5* 9.1 8.6 9.3  NEUTROABS 7.4  --   --   --   --   HGB 13.4 12.3 11.6* 11.3* 11.2*  HCT 43.2 38.7 36.3 34.1* 34.9*  MCV 96.4 93.7 93.3 90.9 95.1  PLT 233 201 178 167 172   Cardiac Enzymes: Recent Labs  Lab 11/17/19 0415  CKTOTAL 170   BNP: Invalid input(s): POCBNP CBG: Recent Labs  Lab 11/18/19 0732 11/18/19 1210 11/18/19 1524 11/18/19 1944 11/19/19 0850  GLUCAP 135* 136* 122* 102* 86   D-Dimer No results for input(s): DDIMER in the last 72 hours. Hgb A1c No results for input(s): HGBA1C in the last 72 hours. Lipid Profile No results for input(s): CHOL, HDL, LDLCALC, TRIG, CHOLHDL, LDLDIRECT in the last 72 hours. Thyroid function studies No results for input(s): TSH, T4TOTAL, T3FREE, THYROIDAB in the last 72 hours.  Invalid input(s): FREET3 Anemia work up No results for input(s): VITAMINB12, FOLATE, FERRITIN, TIBC, IRON, RETICCTPCT in the last 72 hours. Urinalysis    Component Value Date/Time   COLORURINE YELLOW (A) 11/15/2019 0952   APPEARANCEUR HAZY (A) 11/15/2019 0952   APPEARANCEUR Clear 04/21/2013 0715   LABSPEC 1.015 11/15/2019 0952   LABSPEC 1.015 04/21/2013 0715   PHURINE 5.0 11/15/2019 0952   GLUCOSEU NEGATIVE 11/15/2019 0952   GLUCOSEU 50 mg/dL 04/21/2013 0715   HGBUR NEGATIVE 11/15/2019 0952   BILIRUBINUR NEGATIVE 11/15/2019 0952   BILIRUBINUR Negative 04/21/2013 [place] 11/15/2019 [place] NEGATIVE 11/15/2019 0952    NITRITE NEGATIVE 11/15/2019 0952   LEUKOCYTESUR  NEGATIVE 11/15/2019 0952   LEUKOCYTESUR Negative 04/21/2013 0715   Sepsis Labs Invalid input(s): PROCALCITONIN,  WBC,  LACTICIDVEN Microbiology Recent Results (from the past 240 hour(s))  SARS Coronavirus 2 by RT PCR (hospital order, performed in [facility] hospital lab) Nasopharyngeal Nasopharyngeal Swab     Status: None   Collection Time: 11/15/19  9:52 AM   Specimen: Nasopharyngeal Swab  Result Value Ref Range Status   SARS Coronavirus 2 NEGATIVE NEGATIVE Final    Comment: (NOTE) SARS-CoV-2 target nucleic acids are NOT DETECTED.  The SARS-CoV-2 RNA is generally detectable in upper and lower respiratory specimens during the acute phase of infection. The lowest concentration of SARS-CoV-2 viral copies this assay can detect is 250 copies / mL. A negative result does not preclude SARS-CoV-2 infection and should not be used as the sole basis for treatment or other patient management decisions.  A negative result may occur with improper specimen collection / handling, submission of specimen other than nasopharyngeal swab, presence of viral mutation(s) within the areas targeted by this assay, and inadequate number of viral copies (<250 copies / mL). A negative result must be combined with clinical observations, patient history, and epidemiological information.  Fact Sheet for Patients:   StrictlyIdeas.no  Fact Sheet for Healthcare Providers: BankingDealers.co.za  This test is not yet approved or  cleared by the [place] FDA and has been authorized for detection and/or diagnosis of SARS-CoV-2 by FDA under an Emergency Use Authorization (EUA).  This EUA will remain in effect (meaning this test can be used) for the duration of the COVID-19 declaration under Section 564(b)(1) of the Act, 21 U.S.C. section 360bbb-3(b)(1), unless the authorization is terminated or revoked  sooner.  Performed at [facility], [address] [place], [place] [address]     Time coordinating discharge: Over 30 minutes  SIGNED:  Lorella Nimrod, MD  [facility] 11/19/2019, 9:56 AM  If 7PM-7AM, please contact night-coverage [email or web address]  This record has been created using Systems analyst. Errors have been sought and corrected,but may not always be located. Such creation errors do not reflect on the standard of care.

## 2019-11-20 LAB — CK ISOENZYMES
CK-BB: 0 %
CK-MB: 0 % (ref 0–3)
CK-MM: 100 % (ref 97–100)
Creatine Kinase-Total: 231 U/L — ABNORMAL HIGH (ref 32–182)
Macro Type 1: 0 %
Macro Type 2: 0 %

## 2019-12-03 ENCOUNTER — Encounter: Payer: Self-pay | Admitting: Emergency Medicine

## 2019-12-03 ENCOUNTER — Emergency Department: Payer: Medicare Other

## 2019-12-03 ENCOUNTER — Inpatient Hospital Stay
Admission: EM | Admit: 2019-12-03 | Discharge: 2019-12-07 | DRG: 640 | Disposition: A | Discharge status: Home Health | Payer: Medicare Other | Attending: Family Medicine | Admitting: Family Medicine

## 2019-12-03 ENCOUNTER — Other Ambulatory Visit: Payer: Self-pay

## 2019-12-03 DIAGNOSIS — R001 Bradycardia, unspecified: Secondary | ICD-10-CM | POA: Diagnosis present

## 2019-12-03 DIAGNOSIS — N184 Chronic kidney disease, stage 4 (severe): Secondary | ICD-10-CM | POA: Diagnosis not present

## 2019-12-03 DIAGNOSIS — N2581 Secondary hyperparathyroidism of renal origin: Secondary | ICD-10-CM | POA: Diagnosis present

## 2019-12-03 DIAGNOSIS — I10 Essential (primary) hypertension: Secondary | ICD-10-CM | POA: Diagnosis not present

## 2019-12-03 DIAGNOSIS — Z1619 Resistance to other specified beta lactam antibiotics: Secondary | ICD-10-CM | POA: Diagnosis present

## 2019-12-03 DIAGNOSIS — N179 Acute kidney failure, unspecified: Secondary | ICD-10-CM | POA: Diagnosis not present

## 2019-12-03 DIAGNOSIS — Z79890 Hormone replacement therapy (postmenopausal): Secondary | ICD-10-CM | POA: Diagnosis not present

## 2019-12-03 DIAGNOSIS — E871 Hypo-osmolality and hyponatremia: Secondary | ICD-10-CM | POA: Diagnosis present

## 2019-12-03 DIAGNOSIS — N170 Acute kidney failure with tubular necrosis: Secondary | ICD-10-CM | POA: Diagnosis present

## 2019-12-03 DIAGNOSIS — E861 Hypovolemia: Secondary | ICD-10-CM | POA: Diagnosis present

## 2019-12-03 DIAGNOSIS — Z79899 Other long term (current) drug therapy: Secondary | ICD-10-CM

## 2019-12-03 DIAGNOSIS — Z20822 Contact with and (suspected) exposure to covid-19: Secondary | ICD-10-CM | POA: Diagnosis present

## 2019-12-03 DIAGNOSIS — E1122 Type 2 diabetes mellitus with diabetic chronic kidney disease: Secondary | ICD-10-CM | POA: Diagnosis present

## 2019-12-03 DIAGNOSIS — K5900 Constipation, unspecified: Secondary | ICD-10-CM | POA: Diagnosis present

## 2019-12-03 DIAGNOSIS — N189 Chronic kidney disease, unspecified: Secondary | ICD-10-CM

## 2019-12-03 DIAGNOSIS — E875 Hyperkalemia: Secondary | ICD-10-CM | POA: Diagnosis present

## 2019-12-03 DIAGNOSIS — E785 Hyperlipidemia, unspecified: Secondary | ICD-10-CM | POA: Diagnosis present

## 2019-12-03 DIAGNOSIS — Z794 Long term (current) use of insulin: Secondary | ICD-10-CM

## 2019-12-03 DIAGNOSIS — I5032 Chronic diastolic (congestive) heart failure: Secondary | ICD-10-CM | POA: Diagnosis present

## 2019-12-03 DIAGNOSIS — E860 Dehydration: Secondary | ICD-10-CM | POA: Diagnosis not present

## 2019-12-03 DIAGNOSIS — D631 Anemia in chronic kidney disease: Secondary | ICD-10-CM | POA: Diagnosis present

## 2019-12-03 DIAGNOSIS — I130 Hypertensive heart and chronic kidney disease with heart failure and stage 1 through stage 4 chronic kidney disease, or unspecified chronic kidney disease: Secondary | ICD-10-CM | POA: Diagnosis present

## 2019-12-03 DIAGNOSIS — E039 Hypothyroidism, unspecified: Secondary | ICD-10-CM | POA: Diagnosis present

## 2019-12-03 DIAGNOSIS — B9689 Other specified bacterial agents as the cause of diseases classified elsewhere: Secondary | ICD-10-CM | POA: Diagnosis present

## 2019-12-03 DIAGNOSIS — N390 Urinary tract infection, site not specified: Secondary | ICD-10-CM | POA: Diagnosis present

## 2019-12-03 DIAGNOSIS — F0390 Unspecified dementia without behavioral disturbance: Secondary | ICD-10-CM | POA: Diagnosis present

## 2019-12-03 DIAGNOSIS — E872 Acidosis: Secondary | ICD-10-CM | POA: Diagnosis present

## 2019-12-03 DIAGNOSIS — Z7982 Long term (current) use of aspirin: Secondary | ICD-10-CM | POA: Diagnosis not present

## 2019-12-03 DIAGNOSIS — M898X9 Other specified disorders of bone, unspecified site: Secondary | ICD-10-CM | POA: Diagnosis present

## 2019-12-03 DIAGNOSIS — R109 Unspecified abdominal pain: Secondary | ICD-10-CM

## 2019-12-03 LAB — COMPREHENSIVE METABOLIC PANEL
ALT: 15 U/L (ref 0–44)
AST: 34 U/L (ref 15–41)
Albumin: 3.1 g/dL — ABNORMAL LOW (ref 3.5–5.0)
Alkaline Phosphatase: 85 U/L (ref 38–126)
Anion gap: 8 (ref 5–15)
BUN: 75 mg/dL — ABNORMAL HIGH (ref 8–23)
CO2: 26 mmol/L (ref 22–32)
Calcium: 8.2 mg/dL — ABNORMAL LOW (ref 8.9–10.3)
Chloride: 98 mmol/L (ref 98–111)
Creatinine, Ser: 4.22 mg/dL — ABNORMAL HIGH (ref 0.44–1.00)
GFR calc Af Amer: 11 mL/min — ABNORMAL LOW (ref >60)
GFR calc non Af Amer: 10 mL/min — ABNORMAL LOW (ref >60)
Glucose, Bld: 109 mg/dL — ABNORMAL HIGH (ref 70–99)
Potassium: 5.6 mmol/L — ABNORMAL HIGH (ref 3.5–5.1)
Sodium: 132 mmol/L — ABNORMAL LOW (ref 135–145)
Total Bilirubin: 0.8 mg/dL (ref 0.3–1.2)
Total Protein: 7.7 g/dL (ref 6.5–8.1)

## 2019-12-03 LAB — CBC WITH DIFFERENTIAL/PLATELET
Abs Immature Granulocytes: 0.03 10*3/uL (ref 0.00–0.07)
Basophils Absolute: 0 10*3/uL (ref 0.0–0.1)
Basophils Relative: 0 %
Eosinophils Absolute: 0.2 10*3/uL (ref 0.0–0.5)
Eosinophils Relative: 3 %
HCT: 33.5 % — ABNORMAL LOW (ref 36.0–46.0)
Hemoglobin: 11 g/dL — ABNORMAL LOW (ref 12.0–15.0)
Immature Granulocytes: 0 %
Lymphocytes Relative: 34 %
Lymphs Abs: 2.5 10*3/uL (ref 0.7–4.0)
MCH: 30.1 pg (ref 26.0–34.0)
MCHC: 32.8 g/dL (ref 30.0–36.0)
MCV: 91.8 fL (ref 80.0–100.0)
Monocytes Absolute: 0.8 10*3/uL (ref 0.1–1.0)
Monocytes Relative: 11 %
Neutro Abs: 3.8 10*3/uL (ref 1.7–7.7)
Neutrophils Relative %: 52 %
Platelets: 203 10*3/uL (ref 150–400)
RBC: 3.65 MIL/uL — ABNORMAL LOW (ref 3.87–5.11)
RDW: 14.7 % (ref 11.5–15.5)
WBC: 7.3 10*3/uL (ref 4.0–10.5)
nRBC: 0 % (ref 0.0–0.2)

## 2019-12-03 LAB — GLUCOSE, CAPILLARY: Glucose-Capillary: 71 mg/dL (ref 70–99)

## 2019-12-03 LAB — MAGNESIUM: Magnesium: 3.1 mg/dL — ABNORMAL HIGH (ref 1.7–2.4)

## 2019-12-03 MED ORDER — HEPARIN SODIUM (PORCINE) 5000 UNIT/ML IJ SOLN
5000.0000 [IU] | Freq: Three times a day (TID) | INTRAMUSCULAR | Status: DC
  Administered 2019-12-03 – 2019-12-07 (×12): 5000 [IU] via SUBCUTANEOUS
  Filled 2019-12-03 (×12): qty 1

## 2019-12-03 MED ORDER — LACTATED RINGERS IV BOLUS
1000.0000 mL | Freq: Once | INTRAVENOUS | Status: AC
  Administered 2019-12-03: 1000 mL via INTRAVENOUS

## 2019-12-03 MED ORDER — ONDANSETRON HCL 4 MG PO TABS: 4.0000 mg | ORAL_TABLET | Freq: Four times a day (QID) | ORAL | Status: DC | PRN

## 2019-12-03 MED ORDER — ASPIRIN 81 MG PO CHEW
81.0000 mg | CHEWABLE_TABLET | Freq: Every day | ORAL | Status: DC
  Administered 2019-12-04 – 2019-12-07 (×4): 81 mg via ORAL
  Filled 2019-12-03 (×4): qty 1

## 2019-12-03 MED ORDER — PATIROMER SORBITEX CALCIUM 8.4 G PO PACK
8.4000 g | PACK | Freq: Every day | ORAL | Status: DC
  Administered 2019-12-03 – 2019-12-04 (×2): 8.4 g via ORAL
  Filled 2019-12-03 (×3): qty 1

## 2019-12-03 MED ORDER — ONDANSETRON HCL 4 MG/2ML IJ SOLN: 4.0000 mg | Freq: Four times a day (QID) | INTRAMUSCULAR | Status: DC | PRN

## 2019-12-03 MED ORDER — LACTATED RINGERS IV BOLUS
1000.0000 mL | Freq: Once | INTRAVENOUS | Status: AC
  Administered 2019-12-04: 1000 mL via INTRAVENOUS

## 2019-12-03 MED ORDER — POLYETHYLENE GLYCOL 3350 17 G PO PACK
17.0000 g | PACK | Freq: Every day | ORAL | Status: DC
  Administered 2019-12-04 – 2019-12-07 (×4): 17 g via ORAL
  Filled 2019-12-03 (×4): qty 1

## 2019-12-03 MED ORDER — LEVOTHYROXINE SODIUM 50 MCG PO TABS
50.0000 ug | ORAL_TABLET | Freq: Every day | ORAL | Status: DC
  Administered 2019-12-04 – 2019-12-07 (×4): 50 ug via ORAL
  Filled 2019-12-03 (×4): qty 1

## 2019-12-03 MED ORDER — TRAZODONE HCL 50 MG PO TABS
25.0000 mg | ORAL_TABLET | Freq: Every evening | ORAL | Status: DC | PRN
  Administered 2019-12-04: 25 mg via ORAL
  Filled 2019-12-03: qty 1

## 2019-12-03 MED ORDER — INSULIN DEGLUDEC 100 UNIT/ML ~~LOC~~ SOPN: 80.0000 [IU] | PEN_INJECTOR | Freq: Every day | SUBCUTANEOUS | Status: DC

## 2019-12-03 MED ORDER — ATORVASTATIN CALCIUM 20 MG PO TABS
40.0000 mg | ORAL_TABLET | Freq: Every day | ORAL | Status: DC
  Administered 2019-12-04 – 2019-12-07 (×4): 40 mg via ORAL
  Filled 2019-12-03 (×4): qty 2

## 2019-12-03 MED ORDER — MAGNESIUM HYDROXIDE 400 MG/5ML PO SUSP: 30.0000 mL | Freq: Every day | ORAL | Status: DC | PRN

## 2019-12-03 MED ORDER — DONEPEZIL HCL 5 MG PO TABS
10.0000 mg | ORAL_TABLET | Freq: Every day | ORAL | Status: DC
  Administered 2019-12-04 – 2019-12-06 (×4): 10 mg via ORAL
  Filled 2019-12-03 (×5): qty 2

## 2019-12-03 MED ORDER — INSULIN GLARGINE 100 UNIT/ML ~~LOC~~ SOLN
80.0000 [IU] | Freq: Every day | SUBCUTANEOUS | Status: DC
  Filled 2019-12-03: qty 0.8

## 2019-12-03 MED ORDER — CARVEDILOL 25 MG PO TABS
25.0000 mg | ORAL_TABLET | Freq: Two times a day (BID) | ORAL | Status: DC
  Administered 2019-12-04 – 2019-12-07 (×3): 25 mg via ORAL
  Filled 2019-12-03 (×5): qty 1

## 2019-12-03 MED ORDER — ACETAMINOPHEN 650 MG RE SUPP: 650.0000 mg | Freq: Four times a day (QID) | RECTAL | Status: DC | PRN

## 2019-12-03 MED ORDER — AMLODIPINE BESYLATE 10 MG PO TABS
10.0000 mg | ORAL_TABLET | Freq: Every day | ORAL | Status: DC
  Administered 2019-12-04: 10 mg via ORAL
  Filled 2019-12-03: qty 2

## 2019-12-03 MED ORDER — SODIUM CHLORIDE 0.9 % IV SOLN: INTRAVENOUS | Status: DC

## 2019-12-03 MED ORDER — ACETAMINOPHEN 325 MG PO TABS
650.0000 mg | ORAL_TABLET | Freq: Four times a day (QID) | ORAL | Status: DC | PRN
  Administered 2019-12-04 – 2019-12-05 (×2): 650 mg via ORAL
  Filled 2019-12-03 (×2): qty 2

## 2019-12-03 NOTE — ED Triage Notes (Signed)
Pt presents to ED via [facility] from [facility] for weakness and abnormal labs. Pt was sent out to [place] for lab work today and returned with a Creatinine of 4.09 and a BUN of 76.3 (no past hx of CKD//CF) Pt A&Ox4 and does have a sore on her bottom per EMS>   BP: 80/50 then 85/46 P: sinus brady 55bpm CBG: 124 T:m 98.4 axillary   Pt denies any recent medication changes

## 2019-12-03 NOTE — H&P (Signed)
[place]   PATIENT NAME: [name] [name]    MR#:  [id number]  DATE OF BIRTH:  13-Nov-1945  DATE OF ADMISSION:  12/03/2019  PRIMARY CARE PHYSICIAN: Marguerita Merles, MD   REQUESTING/REFERRING PHYSICIAN: Blake Divine, MD  CHIEF COMPLAINT:   Chief Complaint  Patient presents with  . Abnormal Lab  . Weakness    HISTORY OF PRESENT ILLNESS:  [name] [name]  is a 74 y.o. female with a known history of CHF, type 2 diabetes mellitus, hypertension, dyslipidemia and hypothyroidism, who presented to the emergency room with abnormal labs.  She has been having nausea and vomiting over the last several days with subsequent generalized weakness and lightheadedness.  At her skilled nursing facility facility she had blood work that revealed elevated BUN and creatinine.  She admits to mild epigastric abdominal pain with nausea and vomiting.  She has been constipated lately.  She feels her abdomen is "filled up".  No fever or chills.  No cough or wheezing or dyspnea.  No chest pain or palpitations.  No dysuria, oliguria or hematuria or flank pain.  Upon presentation to the ER, heart rate was 55 with otherwise normal vital signs.  Labs revealed a potassium of 5.6 compared to 4.1 on 11/19/2019, sodium 132 with chloride 98 and a BUN of 75 up from 58 then, creatinine of 4.22 up from 2.45 then with magnesium of 3.1 albumin of 3.1 and total protein of 7.7.  CBC showed anemia with hemoglobin of 11 hematocrit 33.5 close to previous levels.  EKG showed sinus bradycardia with rate of 54.  The patient was given 1 L bolus of IV lactated Ringer and 8.4 g of p.o. Veltassa.  She will be admitted to a medically monitored bed for further evaluation and management PAST MEDICAL HISTORY:   Past Medical History:  Diagnosis Date  . CHF (congestive heart failure) ([place])   . Diabetes mellitus without complication ([place])   . Humerus fracture 04/20/2018   left  . Hyperlipidemia   . Hypertension   . Hypothyroidism   .  Vertigo    several yrs ago  . Wears dentures    partial upper and lower (loose)    PAST SURGICAL HISTORY:   Past Surgical History:  Procedure Laterality Date  . CATARACT EXTRACTION W/PHACO Left 12/01/2018   Procedure: CATARACT EXTRACTION PHACO AND INTRAOCULAR LENS PLACEMENT ([place]) LEFT DIABETIC;  Surgeon: Birder Robson, MD;  Location: [place] [facility];  Service: Ophthalmology;  Laterality: Left;  Diabetic - insulin  . COLONOSCOPY WITH PROPOFOL N/A 07/07/2019   Procedure: COLONOSCOPY WITH PROPOFOL;  Surgeon: Robert Bellow, MD;  Location: [facility];  Service: Endoscopy;  Laterality: N/A;  . ESOPHAGOGASTRODUODENOSCOPY (EGD) WITH PROPOFOL N/A 07/07/2019   Procedure: ESOPHAGOGASTRODUODENOSCOPY (EGD) WITH PROPOFOL;  Surgeon: Robert Bellow, MD;  Location: [facility];  Service: Endoscopy;  Laterality: N/A;  . FOOT SURGERY Right    lesion excision    SOCIAL HISTORY:   Social History   Tobacco Use  . Smoking status: Never Smoker  . Smokeless tobacco: Never Used  Substance Use Topics  . Alcohol use: No    FAMILY HISTORY:   Family History  Problem Relation Age of Onset  . Breast cancer Neg Hx     DRUG ALLERGIES:  No Known Allergies  REVIEW OF SYSTEMS:   ROS As per history of present illness. All pertinent systems were reviewed above. Constitutional, HEENT, cardiovascular, respiratory, GI, GU, musculoskeletal, neuro, psychiatric, endocrine, integumentary and hematologic systems were  reviewed and are otherwise negative/unremarkable except for positive findings mentioned above in the HPI.   MEDICATIONS AT HOME:   Prior to Admission medications   Medication Sig Start Date End Date Taking? Authorizing Provider  amLODipine (NORVASC) 10 MG tablet Take 1 tablet by mouth daily. 05/02/16   [provider]  ASPIRIN 81 PO Take by mouth daily.    [provider]  atorvastatin (LIPITOR) 40 MG tablet Take 1 tablet by mouth at bedtime.  05/02/16    [provider]  carvedilol (COREG) 25 MG tablet Take 1 tablet by mouth 2 (two) times daily. 05/02/16   [provider]  donepezil (ARICEPT) 10 MG tablet Take 10 mg by mouth at bedtime.  04/21/18   [provider]  polyethylene glycol (MIRALAX / GLYCOLAX) 17 g packet Take 17 g by mouth daily. 11/19/19   Lorella Nimrod, MD  SYNTHROID 50 MCG tablet Take 1 tablet by mouth daily. 04/21/18   [provider]  TRESIBA FLEXTOUCH 100 UNIT/ML SOPN FlexTouch Pen Inject 80 Units into the skin at bedtime.  04/21/18   [provider]      VITAL SIGNS:  Blood pressure (!) 117/49, pulse (!) 55, temperature 98.3 F (36.8 C), temperature source Rectal, resp. rate 16, height 5\' 7"  (1.702 m), weight 96.6 kg, SpO2 100 %.  PHYSICAL EXAMINATION:  Physical Exam  GENERAL:  74 y.o.-year-old African-American female patient lying in the bed with no acute distress.  EYES: Pupils equal, round, reactive to light and accommodation. No scleral icterus. Extraocular muscles intact.  HEENT: Head atraumatic, normocephalic. Oropharynx and nasopharynx clear.  NECK:  Supple, no jugular venous distention. No thyroid enlargement, no tenderness.  LUNGS: Normal breath sounds bilaterally, no wheezing, rales,rhonchi or crepitation. No use of accessory muscles of respiration.  CARDIOVASCULAR: Regular rate and rhythm, S1, S2 normal. No murmurs, rubs, or gallops.  ABDOMEN: Soft, nondistended with minimal epigastric tenderness without rebound tenderness guarding or rigidity. Bowel sounds present. No organomegaly or mass.  EXTREMITIES: No pedal edema, cyanosis, or clubbing.  NEUROLOGIC: Cranial nerves II through XII are intact. Muscle strength 5/5 in all extremities. Sensation intact. Gait not checked.  PSYCHIATRIC: The patient is alert and oriented x 3.  Normal affect and good eye contact. SKIN: No obvious rash, lesion, or ulcer.   LABORATORY PANEL:   CBC Recent Labs  Lab 12/03/19 2053  WBC  7.3  HGB 11.0*  HCT 33.5*  PLT 203   ------------------------------------------------------------------------------------------------------------------  Chemistries  Recent Labs  Lab 12/03/19 2053  NA 132*  K 5.6*  CL 98  CO2 26  GLUCOSE 109*  BUN 75*  CREATININE 4.22*  CALCIUM 8.2*  MG 3.1*  AST 34  ALT 15  ALKPHOS 85  BILITOT 0.8   ------------------------------------------------------------------------------------------------------------------  Cardiac Enzymes No results for input(s): TROPONINI in the last 168 hours. ------------------------------------------------------------------------------------------------------------------  RADIOLOGY:  No results found.    IMPRESSION AND PLAN:   1.  Acute kidney injury likely prerenal superimposed on stage IV chronic kidney disease.  Patient has been having nausea and vomiting with constipation and subsequent dehydration. -The patient will be admitted to a medically monitored bed. -She will be hydrated with IV normal saline. -We will avoid nephrotoxins. -Daily BMPs will be followed. -Renal ultrasound was ordered and revealed mild atrophic kidneys that represent medical renal disease with no hydronephrosis or shadowing stone. -A nephrology consultation will be obtained. -Dr. Holley Raring was notified about the patient.  2.  Hyperkalemia. -This is clearly secondary to her her acute  kidney injury. -She was managed with p.o. Veltassa and will follow her potassium.  3.  Type 2 diabetes mellitus. -The patient will be placed on supplemental coverage with NovoLog and will continue her basal coverage.  4.  Essential hypertension. -We will continue Coreg and amlodipine.  5.  Dyslipidemia. -We will continue statin therapy.  6.  Dementia. -We will continue Aricept.  7.  Hypothyroidism. -We will check TSH and continue Synthroid.  8.  DVT prophylaxis. -Subcutaneous Lovenox.   All the records are reviewed and case discussed  with ED provider. The plan of care was discussed in details with the patient (and family). I answered all questions. The patient agreed to proceed with the above mentioned plan. Further management will depend upon hospital course.   CODE STATUS: Full code  Status is: Inpatient  Remains inpatient appropriate because:Ongoing diagnostic testing needed not appropriate for outpatient work up, Unsafe d/c plan, IV treatments appropriate due to intensity of illness or inability to take PO and Inpatient level of care appropriate due to severity of illness   Dispo: The patient is from: Home              Anticipated d/c is to: Home              Anticipated d/c date is: 2 days              Patient currently is not medically stable to d/c.   TOTAL TIME TAKING CARE OF THIS PATIENT: 55 minutes.    Christel Mormon M.D on 12/03/2019 at 10:12 PM  [facility]   From 7 PM-7 AM, contact night-coverage [email or web address]  CC: Primary care physician; Marguerita Merles, MD   Note: This dictation was prepared with Dragon dictation along with smaller phrase technology. Any transcriptional typo errors that result from this process are unintentional.

## 2019-12-03 NOTE — ED Provider Notes (Signed)
[facility] Emergency Department Provider Note   ____________________________________________   First MD Initiated Contact with Patient 12/03/19 2040     (approximate)  I have reviewed the triage vital signs and the nursing notes.   HISTORY  Chief Complaint Abnormal Lab and Weakness    HPI [name] [name] is a 74 y.o. female with past medical history of hypertension, hyperlipidemia, diabetes, CHF, and hypothyroidism who presents to the ED for abnormal labs.  Patient reports that she has been dealing with nausea and vomiting for the past few days and has started feeling weaker than usual with some lightheadedness.  They checked her labs at her nursing facility and found her to have an elevated BUN and creatinine.  She was subsequently referred to the ED for further evaluation.  She denies any fevers, cough, chest pain, shortness of breath, abdominal pain, dysuria, or hematuria.  She has not had any issues with confusion per EMS.        Past Medical History:  Diagnosis Date  . CHF (congestive heart failure) ([place])   . Diabetes mellitus without complication ([place])   . Humerus fracture 04/20/2018   left  . Hyperlipidemia   . Hypertension   . Hypothyroidism   . Vertigo    several yrs ago  . Wears dentures    partial upper and lower (loose)    Patient Active Problem List   Diagnosis Date Noted  . AKI (acute kidney injury) ([place]) 11/15/2019  . Physical deconditioning 11/15/2019  . Aspiration pneumonia ([place]) 10/10/2019  . Hyperkalemia   . Rib pain on left side   . Leukocytosis 10/04/2019  . Fall at home, initial encounter 10/04/2019  . Cellulitis of right leg 10/04/2019  . CKD stage 4 due to type 2 diabetes mellitus ([place]) 10/04/2019  . Acute kidney injury superimposed on CKD ([place])   . RUQ pain   . Hyperlipidemia   . Right leg pain 10/03/2019  . Diabetic ulcer of right foot associated with type 2 diabetes mellitus, with fat layer exposed ([place])  10/03/2019  . Cellulitis of right foot 10/03/2019  . HTN (hypertension) 10/03/2019  . Type 2 diabetes mellitus with stage 3 chronic kidney disease ([place]) 10/03/2019  . Hypothyroidism (acquired) 10/03/2019  . UTI (urinary tract infection) 10/03/2019  . Sepsis ([place]) 04/25/2018    Past Surgical History:  Procedure Laterality Date  . CATARACT EXTRACTION W/PHACO Left 12/01/2018   Procedure: CATARACT EXTRACTION PHACO AND INTRAOCULAR LENS PLACEMENT ([place]) LEFT DIABETIC;  Surgeon: Birder Robson, MD;  Location: [facility];  Service: Ophthalmology;  Laterality: Left;  Diabetic - insulin  . COLONOSCOPY WITH PROPOFOL N/A 07/07/2019   Procedure: COLONOSCOPY WITH PROPOFOL;  Surgeon: Robert Bellow, MD;  Location: [facility];  Service: Endoscopy;  Laterality: N/A;  . ESOPHAGOGASTRODUODENOSCOPY (EGD) WITH PROPOFOL N/A 07/07/2019   Procedure: ESOPHAGOGASTRODUODENOSCOPY (EGD) WITH PROPOFOL;  Surgeon: Robert Bellow, MD;  Location: [facility];  Service: Endoscopy;  Laterality: N/A;  . FOOT SURGERY Right    lesion excision    Prior to Admission medications   Medication Sig Start Date End Date Taking? Authorizing Provider  amLODipine (NORVASC) 10 MG tablet Take 1 tablet by mouth daily. 05/02/16   [provider]  ASPIRIN 81 PO Take by mouth daily.    [provider]  atorvastatin (LIPITOR) 40 MG tablet Take 1 tablet by mouth at bedtime.  05/02/16   [provider]  carvedilol (COREG) 25 MG tablet Take 1 tablet by mouth 2 (two)  times daily. 05/02/16   [provider]  donepezil (ARICEPT) 10 MG tablet Take 10 mg by mouth at bedtime.  04/21/18   [provider]  polyethylene glycol (MIRALAX / GLYCOLAX) 17 g packet Take 17 g by mouth daily. 11/19/19   Lorella Nimrod, MD  SYNTHROID 50 MCG tablet Take 1 tablet by mouth daily. 04/21/18   [provider]  TRESIBA FLEXTOUCH 100 UNIT/ML SOPN FlexTouch Pen Inject 80 Units into the skin at bedtime.   04/21/18   [provider]    Allergies Patient has no known allergies.  Family History  Problem Relation Age of Onset  . Breast cancer Neg Hx     Social History Social History   Tobacco Use  . Smoking status: Never Smoker  . Smokeless tobacco: Never Used  Vaping Use  . Vaping Use: Never used  Substance Use Topics  . Alcohol use: No  . Drug use: Never    Review of Systems  Constitutional: No fever/chills.  Positive for lightheadedness and generalized weakness. Eyes: No visual changes. ENT: No sore throat. Cardiovascular: Denies chest pain. Respiratory: Denies shortness of breath. Gastrointestinal: No abdominal pain.  Positive for nausea and vomiting.  No diarrhea.  No constipation. Genitourinary: Negative for dysuria. Musculoskeletal: Negative for back pain. Skin: Negative for rash. Neurological: Negative for headaches, focal weakness or numbness.  ____________________________________________   PHYSICAL EXAM:  VITAL SIGNS: ED Triage Vitals  Enc Vitals Group     BP 12/03/19 2049 (!) 117/49     Pulse Rate 12/03/19 2049 (!) 55     Resp 12/03/19 2049 16     Temp 12/03/19 2049 98.3 F (36.8 C)     Temp Source 12/03/19 2049 Rectal     SpO2 12/03/19 2049 100 %     Weight 12/03/19 2052 213 lb (96.6 kg)     Height 12/03/19 2052 5\' 7"  (1.702 m)     Head Circumference --      Peak Flow --      Pain Score 12/03/19 2052 0     Pain Loc --      Pain Edu? --      Excl. in [place]? --     Constitutional: Alert and oriented to person, place, time, and situation. Eyes: Conjunctivae are normal. Head: Atraumatic. Nose: No congestion/rhinnorhea. Mouth/Throat: Mucous membranes are dry. Neck: Normal ROM Cardiovascular: Normal rate, regular rhythm. Grossly normal heart sounds. Respiratory: Normal respiratory effort.  No retractions. Lungs CTAB. Gastrointestinal: Soft and nontender. No distention.  Fecal impaction noted with rectal exam. Genitourinary:  deferred Musculoskeletal: No lower extremity tenderness nor edema. Neurologic:  Normal speech and language. No gross focal neurologic deficits are appreciated. Skin:  Skin is warm, dry and intact. No rash noted.  Superficial sacral ulceration noted with no signs of infection. Psychiatric: Mood and affect are normal. Speech and behavior are normal.  ____________________________________________   LABS (all labs ordered are listed, but only abnormal results are displayed)  Labs Reviewed  CBC WITH DIFFERENTIAL/PLATELET - Abnormal; Notable for the following components:      Result Value   RBC 3.65 (*)    Hemoglobin 11.0 (*)    HCT 33.5 (*)    All other components within normal limits  COMPREHENSIVE METABOLIC PANEL - Abnormal; Notable for the following components:   Sodium 132 (*)    Potassium 5.6 (*)    Glucose, Bld 109 (*)    BUN 75 (*)    Creatinine, Ser 4.22 (*)  Calcium 8.2 (*)    Albumin 3.1 (*)    GFR calc non Af Amer 10 (*)    GFR calc Af Amer 11 (*)    All other components within normal limits  MAGNESIUM - Abnormal; Notable for the following components:   Magnesium 3.1 (*)    All other components within normal limits  SARS CORONAVIRUS 2 BY RT PCR (HOSPITAL ORDER, [place] [facility])  URINALYSIS, COMPLETE (UACMP) WITH MICROSCOPIC   ____________________________________________  EKG  ED ECG REPORT I, Blake Divine, the attending physician, personally viewed and interpreted this ECG.   Date: 12/03/2019  EKG Time: 20:59  Rate: 54  Rhythm: sinus bradycardia  Axis: Normal  Intervals:none  ST&T Change: None   PROCEDURES  Procedure(s) performed (including Critical Care):  .Critical Care Performed by: Blake Divine, MD Authorized by: Blake Divine, MD   Critical care provider statement:    Critical care time (minutes):  45   Critical care time was exclusive of:  Separately billable procedures and treating other patients and teaching  time   Critical care was necessary to treat or prevent imminent or life-threatening deterioration of the following conditions:  Renal failure   Critical care was time spent personally by me on the following activities:  Discussions with consultants, evaluation of patient's response to treatment, examination of patient, ordering and performing treatments and interventions, ordering and review of laboratory studies, ordering and review of radiographic studies, pulse oximetry, re-evaluation of patient's condition, obtaining history from patient or surrogate and review of old charts   I assumed direction of critical care for this patient from another provider in my specialty: no      ------------------------------------------------------------------------------------------------------------------- Fecal Disimpaction Procedure Note:  Performed by me:  Patient placed in the lateral recumbent position with knees drawn towards chest. Nurse present for patient support. Large amount of hard Davidovich stool removed. No complications during procedure.   ------------------------------------------------------------------------------------------------------------------   ____________________________________________   INITIAL IMPRESSION / ASSESSMENT AND PLAN / ED COURSE       74 year old female with past medical history of hypertension, hyperlipidemia, diabetes, CHF, and hypothyroidism presents to the ED for generalized weakness, lightheadedness, nausea, and vomiting.  She was subsequently found to have elevated BUN and creatinine on outpatient labs.  This is likely secondary to dehydration given her recent poor p.o. intake and vomiting.  Will hydrate with 1 L of LR initially and reevaluate.  EKG and repeat labs are pending at this time, potassium was within normal limits earlier today.  Patient was noted to have fecal impaction on rectal exam which was disimpacted without issue.  Labs confirm AKI with  creatinine of 4.2 and mildly elevated potassium at 5.6.  Other than some mild bradycardia, no acute EKG changes noted.  Remainder of labs are reassuring.  We will hydrate patient with 2 L IV fluids and give dose of Veltassa.  Case discussed with Dr. Holley Raring of nephrology, who agrees with plan.  Case discussed with hospitalist for admission.      ____________________________________________   FINAL CLINICAL IMPRESSION(S) / ED DIAGNOSES  Final diagnoses:  AKI (acute kidney injury) ([place])  Hyperkalemia     ED Discharge Orders    None       Note:  This document was prepared using Dragon voice recognition software and may include unintentional dictation errors.   Blake Divine, MD 12/03/19 2202

## 2019-12-04 ENCOUNTER — Encounter: Payer: Self-pay | Admitting: Family Medicine

## 2019-12-04 ENCOUNTER — Inpatient Hospital Stay: Payer: Medicare Other

## 2019-12-04 LAB — BASIC METABOLIC PANEL
Anion gap: 10 (ref 5–15)
BUN: 62 mg/dL — ABNORMAL HIGH (ref 8–23)
CO2: 21 mmol/L — ABNORMAL LOW (ref 22–32)
Calcium: 8.1 mg/dL — ABNORMAL LOW (ref 8.9–10.3)
Chloride: 99 mmol/L (ref 98–111)
Creatinine, Ser: 3.41 mg/dL — ABNORMAL HIGH (ref 0.44–1.00)
GFR calc Af Amer: 15 mL/min — ABNORMAL LOW (ref >60)
GFR calc non Af Amer: 13 mL/min — ABNORMAL LOW (ref >60)
Glucose, Bld: 75 mg/dL (ref 70–99)
Potassium: 5.9 mmol/L — ABNORMAL HIGH (ref 3.5–5.1)
Sodium: 130 mmol/L — ABNORMAL LOW (ref 135–145)

## 2019-12-04 LAB — URINALYSIS, COMPLETE (UACMP) WITH MICROSCOPIC
Bilirubin Urine: NEGATIVE
Glucose, UA: NEGATIVE mg/dL
Ketones, ur: NEGATIVE mg/dL
Nitrite: NEGATIVE
Protein, ur: NEGATIVE mg/dL
Specific Gravity, Urine: 1.013 (ref 1.005–1.030)
pH: 7 (ref 5.0–8.0)

## 2019-12-04 LAB — TSH: TSH: 0.895 u[IU]/mL (ref 0.350–4.500)

## 2019-12-04 LAB — GLUCOSE, CAPILLARY
Glucose-Capillary: 75 mg/dL (ref 70–99)
Glucose-Capillary: 88 mg/dL (ref 70–99)
Glucose-Capillary: 77 mg/dL (ref 70–99)
Glucose-Capillary: 77 mg/dL (ref 70–99)

## 2019-12-04 LAB — SARS CORONAVIRUS 2 BY RT PCR (HOSPITAL ORDER, PERFORMED IN ~~LOC~~ HOSPITAL LAB): SARS Coronavirus 2: NEGATIVE

## 2019-12-04 LAB — CREATININE, URINE, RANDOM: Creatinine, Urine: 80 mg/dL

## 2019-12-04 LAB — POTASSIUM: Potassium: 6.4 mmol/L (ref 3.5–5.1)

## 2019-12-04 LAB — SODIUM, URINE, RANDOM: Sodium, Ur: 68 mmol/L

## 2019-12-04 LAB — MRSA PCR SCREENING: MRSA by PCR: NEGATIVE

## 2019-12-04 MED ORDER — SODIUM CHLORIDE 0.9 % IV SOLN
1.0000 g | INTRAVENOUS | Status: DC
  Administered 2019-12-05 – 2019-12-06 (×2): 1 g via INTRAVENOUS
  Filled 2019-12-04 (×2): qty 1
  Filled 2019-12-04: qty 10

## 2019-12-04 MED ORDER — SODIUM CHLORIDE 0.9 % IV SOLN
1.0000 g | Freq: Once | INTRAVENOUS | Status: AC
  Administered 2019-12-04: 1 g via INTRAVENOUS
  Filled 2019-12-04: qty 10

## 2019-12-04 MED ORDER — INSULIN ASPART 100 UNIT/ML ~~LOC~~ SOLN
0.0000 [IU] | Freq: Three times a day (TID) | SUBCUTANEOUS | Status: DC
  Administered 2019-12-05 – 2019-12-07 (×3): 1 [IU] via SUBCUTANEOUS
  Filled 2019-12-04 (×3): qty 1

## 2019-12-04 MED ORDER — SODIUM CHLORIDE 0.9 % IV BOLUS
250.0000 mL | Freq: Once | INTRAVENOUS | Status: AC
  Administered 2019-12-04: 250 mL via INTRAVENOUS

## 2019-12-04 MED ORDER — SODIUM ZIRCONIUM CYCLOSILICATE 10 G PO PACK
10.0000 g | PACK | Freq: Every day | ORAL | Status: DC
  Administered 2019-12-04 – 2019-12-07 (×4): 10 g via ORAL
  Filled 2019-12-04 (×4): qty 1

## 2019-12-04 NOTE — Progress Notes (Addendum)
PROGRESS NOTE    [name]  HFW:[id number] DOB: 11/20/1945 DOA: 12/03/2019 PCP: Marguerita Merles, MD    Brief Narrative:  [name] [name]  is a 74 y.o. female with a known history of CHF, type 2 diabetes mellitus, hypertension, dyslipidemia and hypothyroidism, who presented to the emergency room with abnormal labs.  She has been having nausea and vomiting over the last several days with subsequent generalized weakness and lightheadedness.  At her skilled nursing facility she had blood work that revealed elevated BUN and creatinine.  She admits to mild epigastric abdominal pain with nausea and vomiting.  She has been constipated lately.  She feels her abdomen is "filled up".  No fever or chills.  No cough or wheezing or dyspnea.  No chest pain or palpitations.  No dysuria, oliguria or hematuria or flank pain. Labs revealed a potassium of 5.6 compared to 4.1 on 11/19/2019, sodium 132 with chloride 98 and a BUN of 75 up from 58 then, creatinine of 4.22 up from 2.45 then with magnesium of 3.1 albumin of 3.1 and total protein of 7.7.  CBC showed anemia with hemoglobin of 11 hematocrit 33.5 close to previous levels.  EKG showed sinus bradycardia with rate of 54.  This patient is admitted for acute on chronic kidney disease and hyperkalemia. Patient has been getting IV fluids, serum creatinine is improving, has received Lokelma for hyperkalemia,  nephrology consulted awaiting recommendation.    Assessment & Plan:   Active Problems:   AKI (acute kidney injury) ([place])  # Acute kidney injury likely prerenal superimposed on stage IV chronic kidney disease.   Patient has been having nausea and vomiting with constipation and subsequent dehydration. -The patient will be admitted to a medically monitored bed. -Continue hydration with IV normal saline. - avoid nephrotoxins. -Daily BMPs will be followed. -Renal ultrasound was ordered and revealed mild atrophic kidneys that represent medical renal disease  with no hydronephrosis or shadowing stone. -Nephrology consulted, awaiting recommendations.  2.  Hyperkalemia >>> Improving. -This is clearly secondary to her acute kidney injury. -She was managed with p.o. Veltassa and will follow her potassium.  3.  Type 2 diabetes mellitus. - supplemental coverage with NovoLog and will continue her basal coverage.  4.  Essential hypertension. - continue Coreg and amlodipine.  5.  Dyslipidemia. - continue statin therapy.  6.  Dementia. - continue Aricept.  7.  Hypothyroidism. -We will check TSH and continue Synthroid.  8.  DVT prophylaxis. -Subcutaneous Lovenox.  9. UTI:  UA shows trace leukocyte Estrace. She reports mild burning while urinating. Will give ceftriaxone IV once.   DVT prophylaxis: Lovenox Code Status: Full Family Communication: Discussed with son at bed side, Disposition Plan:  Dispo: The patient is from: Home  Anticipated d/c is to: Home  Anticipated d/c date is: 2 days  Patient currently is not medically stable to d/c.   Consultants:   Nephrology  Procedures:  Anti-infectives (From admission, onward)   None       Antimicrobials: Anti-infectives (From admission, onward)   None     Subjective: Patient was seen and examined at bedside. No overnight events. The patient reports nausea and vomiting has improved. He reports generalized weakness.  Objective: Vitals:   12/04/19 1000 12/04/19 1100 12/04/19 1150 12/04/19 1300  BP: 119/61 (!) 123/54 (!) 118/52 (!) 122/58  Pulse: (!) 54 (!) 50 (!) 55 (!) 51  Resp:   18   Temp:      TempSrc:  SpO2: 96% 98% 98% 97%  Weight:      Height:        Intake/Output Summary (Last 24 hours) at 12/04/2019 1452 Last data filed at 12/04/2019 1239 Gross per 24 hour  Intake 900 ml  Output 140 ml  Net 760 ml   Filed Weights   12/03/19 2052  Weight: 96.6 kg    Examination:  General exam: Appears calm and  comfortable  Respiratory system: Clear to auscultation. Respiratory effort normal. Cardiovascular system: S1 & S2 heard, RRR. No JVD, murmurs, rubs, gallops or clicks. No pedal edema. Gastrointestinal system: Abdomen is nondistended, soft and nontender. No organomegaly or masses felt. Normal bowel sounds heard. Central nervous system: Alert and oriented. No focal neurological deficits. Extremities:  No edema, no cyanosis. Skin: No rashes, lesions or ulcers Psychiatry: Judgement and insight appear normal. Mood & affect appropriate.     Data Reviewed: I have personally reviewed following labs and imaging studies  CBC: Recent Labs  Lab 12/03/19 2053  WBC 7.3  NEUTROABS 3.8  HGB 11.0*  HCT 33.5*  MCV 91.8  PLT 852   Basic Metabolic Panel: Recent Labs  Lab 12/03/19 2053 12/04/19 0806  NA 132*  --   K 5.6* 6.4*  CL 98  --   CO2 26  --   GLUCOSE 109*  --   BUN 75*  --   CREATININE 4.22*  --   CALCIUM 8.2*  --   MG 3.1*  --    GFR: Estimated Creatinine Clearance: 14.2 mL/min (A) (by C-G formula based on SCr of 4.22 mg/dL (H)). Liver Function Tests: Recent Labs  Lab 12/03/19 2053  AST 34  ALT 15  ALKPHOS 85  BILITOT 0.8  PROT 7.7  ALBUMIN 3.1*   No results for input(s): LIPASE, AMYLASE in the last 168 hours. No results for input(s): AMMONIA in the last 168 hours. Coagulation Profile: No results for input(s): INR, PROTIME in the last 168 hours. Cardiac Enzymes: No results for input(s): CKTOTAL, CKMB, CKMBINDEX, TROPONINI in the last 168 hours. BNP (last 3 results) No results for input(s): PROBNP in the last 8760 hours. HbA1C: No results for input(s): HGBA1C in the last 72 hours. CBG: Recent Labs  Lab 12/03/19 2314 12/04/19 0807 12/04/19 1157  GLUCAP 71 75 88   Lipid Profile: No results for input(s): CHOL, HDL, LDLCALC, TRIG, CHOLHDL, LDLDIRECT in the last 72 hours. Thyroid Function Tests: Recent Labs    12/04/19 0241  TSH 0.895   Anemia Panel: No  results for input(s): VITAMINB12, FOLATE, FERRITIN, TIBC, IRON, RETICCTPCT in the last 72 hours. Sepsis Labs: No results for input(s): PROCALCITON, LATICACIDVEN in the last 168 hours.  Recent Results (from the past 240 hour(s))  SARS Coronavirus 2 by RT PCR (hospital order, performed in [facility] hospital lab) Nasopharyngeal Nasopharyngeal Swab     Status: None   Collection Time: 12/03/19 11:35 PM   Specimen: Nasopharyngeal Swab  Result Value Ref Range Status   SARS Coronavirus 2 NEGATIVE NEGATIVE Final    Comment: (NOTE) SARS-CoV-2 target nucleic acids are NOT DETECTED.  The SARS-CoV-2 RNA is generally detectable in upper and lower respiratory specimens during the acute phase of infection. The lowest concentration of SARS-CoV-2 viral copies this assay can detect is 250 copies / mL. A negative result does not preclude SARS-CoV-2 infection and should not be used as the sole basis for treatment or other patient management decisions.  A negative result may occur with improper specimen collection / handling, submission  of specimen other than nasopharyngeal swab, presence of viral mutation(s) within the areas targeted by this assay, and inadequate number of viral copies (<250 copies / mL). A negative result must be combined with clinical observations, patient history, and epidemiological information.  Fact Sheet for Patients:   StrictlyIdeas.no  Fact Sheet for Healthcare Providers: BankingDealers.co.za  This test is not yet approved or  cleared by the [place] FDA and has been authorized for detection and/or diagnosis of SARS-CoV-2 by FDA under an Emergency Use Authorization (EUA).  This EUA will remain in effect (meaning this test can be used) for the duration of the COVID-19 declaration under Section 564(b)(1) of the Act, 21 U.S.C. section 360bbb-3(b)(1), unless the authorization is terminated or revoked sooner.  Performed at  [facility], [address], [place], [place] [address]      Radiology Studies: DG Abd 1 View  Result Date: 12/04/2019 CLINICAL DATA:  74 year old female with abdominal pain. EXAM: ABDOMEN - 1 VIEW COMPARISON:  Abdominal radiograph dated 11/16/2019. FINDINGS: No bowel dilatation or evidence of obstruction. No free air. Renal vascular calcifications. Degenerative changes of the spine. The soft tissues are grossly unremarkable IMPRESSION: Negative. Electronically Signed   By: Anner Crete M.D.   On: 12/04/2019 00:52   US Renal  Result Date: 12/03/2019 CLINICAL DATA:  74 year old female with acute renal insufficiency. EXAM: RENAL / URINARY TRACT ULTRASOUND COMPLETE COMPARISON:  Renal ultrasound dated 11/16/2019. FINDINGS: Right Kidney: Renal measurements: 8.7 x 4.8 x 4.6 cm = volume: 100 mL. Mild parenchyma atrophy and increased echogenicity. No hydronephrosis or shadowing stone. Left Kidney: Renal measurements: 8.8 x 5.1 x 4.2 cm = volume: 98 mL. Mildly atrophic and echogenic. No hydronephrosis or shadowing stone. Bladder: Appears normal for degree of bladder distention. Other: None. IMPRESSION: Mildly atrophic kidneys may represent medical renal disease. No hydronephrosis or shadowing stone. Electronically Signed   By: Anner Crete M.D.   On: 12/03/2019 22:26   Scheduled Meds: . amLODipine  10 mg Oral Daily  . aspirin  81 mg Oral Daily  . atorvastatin  40 mg Oral Daily  . carvedilol  25 mg Oral BID  . donepezil  10 mg Oral QHS  . heparin  5,000 Units Subcutaneous Q8H  . insulin aspart  0-9 Units Subcutaneous TID AC & HS  . levothyroxine  50 mcg Oral Daily  . polyethylene glycol  17 g Oral Daily  . sodium zirconium cyclosilicate  10 g Oral Daily   Continuous Infusions: . sodium chloride 100 mL/hr at 12/04/19 1149     LOS: 1 day    Time spent: 25 mins.    Shawna Clamp, MD [facility]   If 7PM-7AM, please contact night-coverage

## 2019-12-04 NOTE — ED Notes (Signed)
Medications administered per order. Pt assisted to eat several bites of apple sauce. Pt visualized in NAD, pt covered with blankets per her request. Call bell remains within reach of patient. VSS at this time.

## 2019-12-04 NOTE — ED Notes (Addendum)
Son at bedside. Peri care performed. purewick changed. Sacral foam dressing changed. Stage 2 PI on buttock.

## 2019-12-04 NOTE — ED Notes (Signed)
MD at bedside discussing possibility of d/c pending renal labs. Son at bedside. No needs expressed at this time.

## 2019-12-04 NOTE — ED Notes (Signed)
Peri care performed, purewick and chux changed.

## 2019-12-04 NOTE — ED Notes (Signed)
Bladder scan preformed- 98cc noted

## 2019-12-04 NOTE — ED Notes (Signed)
Date and time results received: 12/04/19 0945 (use smartphrase ".now" to insert current time)  Test: K Critical Value: 6.4  Name of Provider Notified: Dr. Dwyane Dee  Orders Received? Or Actions Taken?: Notified via secure chat

## 2019-12-04 NOTE — ED Notes (Signed)
Breakfast tray served.

## 2019-12-04 NOTE — ED Notes (Signed)
Pt repositioned in bed. purewick placed and peri care performed. Son at bedside.

## 2019-12-04 NOTE — Progress Notes (Signed)
AUTIE VASUDEVAN  MRN: [id number]  DOB/AGE: [birth year] 74 y.o.  Primary Care Physician:Miles, Connye Burkitt, MD  Admit date: 12/03/2019  Chief Complaint:  Chief Complaint  Patient presents with  . Abnormal Lab  . Weakness    S-Pt presented on  12/03/2019 with  Chief Complaint  Patient presents with  . Abnormal Lab  . Weakness  . Patient is a 74 year old African-American female with a past medical history of  CHF, diabetes mellitus type 2 , Hypertension, dyslipidemia and hypothyroidism, who was brought to the ER with a chief complaint of abnormal labs . History of present dates back to past several days when patient was having nausea and vomiting. Patient later developed weakness and lightheadedness. Patient had lab work done at [facility] that showed elevated BUN and creatinine.  Upon evaluation in the ER patient was found to have u potassium of 5.6 compared to 4.1 on 11/19/2019, sodium 132 with chloride 98 and a BUN of 75 up from 58 then, creatinine of 4.22 up from 2.45.  Patient was earlier admitted on November 15, 2019 with a dehydration/metabolic acidosis/acute kidney injury  Patient was admitted for further care and nephrology was consulted Patient was seen in the ER. Patient offers no specific complaint Patient states she is feeling better than before  Medications  . amLODipine  10 mg Oral Daily  . aspirin  81 mg Oral Daily  . atorvastatin  40 mg Oral Daily  . carvedilol  25 mg Oral BID  . donepezil  10 mg Oral QHS  . heparin  5,000 Units Subcutaneous Q8H  . insulin aspart  0-9 Units Subcutaneous TID AC & HS  . levothyroxine  50 mcg Oral Daily  . polyethylene glycol  17 g Oral Daily  . sodium zirconium cyclosilicate  10 g Oral Daily         HYW:VPXTG from the symptoms mentioned above,there are no other symptoms referable to all systems reviewed.  Physical Exam: Vital signs in last 24 hours: Temp:  [97.9 F (36.6 C)-98.3 F (36.8 C)] 97.9 F (36.6 C) (08/28 0841) Pulse  Rate:  [48-57] 51 (08/28 1543) Resp:  [12-20] 20 (08/28 1543) BP: (97-125)/(44-74) 115/51 (08/28 1543) SpO2:  [95 %-100 %] 98 % (08/28 1543) Weight:  [96.6 kg] 96.6 kg (08/27 2052) Weight change:     Intake/Output from previous day: No intake/output data recorded. Total I/O In: 900 [I.V.:900] Out: 140 [Urine:140]   Physical Exam: General- pt is awake,alert, following commands  resp- No acute REsp distress, CTA B/L NO Rhonchi CVS- S1S2 regular in rate and rhythm GIT- BS+, soft, NT, ND EXT- NO LE Edema, no cyanosis   Lab Results: CBC Recent Labs    12/03/19 2053  WBC 7.3  HGB 11.0*  HCT 33.5*  PLT 203    BMET Recent Labs    12/03/19 2053 12/03/19 2053 12/04/19 0806 12/04/19 1400  NA 132*  --   --  130*  K 5.6*   < > 6.4* 5.9*  CL 98  --   --  99  CO2 26  --   --  21*  GLUCOSE 109*  --   --  75  BUN 75*  --   --  62*  CREATININE 4.22*  --   --  3.41*  CALCIUM 8.2*  --   --  8.1*   < > = values in this interval not displayed.   Creatinine trend 2021 4.2==>3.4 July admission 1.6--2.2 June admission 2.0--2.8 2020 1.5--2.2 2018 2.0  2017 1.8 2016 1.6--1.9 2015 1.4--1.7 2014 1.4 2013 1.9  MICRO Recent Results (from the past 240 hour(s))  SARS Coronavirus 2 by RT PCR (hospital order, performed in [facility] hospital lab) Nasopharyngeal Nasopharyngeal Swab     Status: None   Collection Time: 12/03/19 11:35 PM   Specimen: Nasopharyngeal Swab  Result Value Ref Range Status   SARS Coronavirus 2 NEGATIVE NEGATIVE Final    Comment: (NOTE) SARS-CoV-2 target nucleic acids are NOT DETECTED.  The SARS-CoV-2 RNA is generally detectable in upper and lower respiratory specimens during the acute phase of infection. The lowest concentration of SARS-CoV-2 viral copies this assay can detect is 250 copies / mL. A negative result does not preclude SARS-CoV-2 infection and should not be used as the sole basis for treatment or other patient management decisions.  A  negative result may occur with improper specimen collection / handling, submission of specimen other than nasopharyngeal swab, presence of viral mutation(s) within the areas targeted by this assay, and inadequate number of viral copies (<250 copies / mL). A negative result must be combined with clinical observations, patient history, and epidemiological information.  Fact Sheet for Patients:   StrictlyIdeas.no  Fact Sheet for Healthcare Providers: BankingDealers.co.za  This test is not yet approved or  cleared by the [place] FDA and has been authorized for detection and/or diagnosis of SARS-CoV-2 by FDA under an Emergency Use Authorization (EUA).  This EUA will remain in effect (meaning this test can be used) for the duration of the COVID-19 declaration under Section 564(b)(1) of the Act, 21 U.S.C. section 360bbb-3(b)(1), unless the authorization is terminated or revoked sooner.  Performed at [facility], [address] [place], [place] [address]       Lab Results  Component Value Date   CALCIUM 8.1 (L) 12/04/2019   PHOS 3.8 11/19/2019       Renal ultrasound done on August 27 Right Kidney:  Renal measurements: 8.7 x 4.8 x 4.6 cm = volume: 100 mL. Mild parenchyma atrophy and increased echogenicity. No hydronephrosis or shadowing stone.  Left Kidney:  Renal measurements: 8.8 x 5.1 x 4.2 cm = volume: 98 mL. Mildly atrophic and echogenic. No hydronephrosis or shadowing stone.  Bladder:  Appears normal for degree of bladder distention.  Other:  None.  IMPRESSION: Mildly atrophic kidneys may represent medical renal disease. No hydronephrosis or shadowing stone.      Impression: 1)Renal  AKI secondary to ATN ATN secondary to hypovolemia/decreased p.o. 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  Phosphorus at goal.   5) hyperkalemia Patient hyperkalemia is being medically managed Patient has no EKG changes/patient is hemodynamically stable Patient responding to medical treatment  6) hyponatremia Most likely hypovolemic  7)Acid base Co2 at goal     Plan:  Renal ultrasound results were reviewed Post obstructive issues ruled out  We will ask for FENa We will follow up on Chem-7  Agree with the current IV fluids  Agree with medical management of hyperkalemia      Mickeal Daws s [name] 12/04/2019, 3:53 PM

## 2019-12-04 NOTE — ED Notes (Signed)
This RN spoke with Dr. [name] regarding patient condition, VORB to change potassium to BMP recheck at 1400 as discussed with Dr. Dwyane Dee.

## 2019-12-04 NOTE — ED Notes (Signed)
Son back at bedside.

## 2019-12-04 NOTE — Progress Notes (Signed)
   12/04/19 1742  Assess: MEWS Score  Temp 97.9 F (36.6 C)  BP (!) 100/52  Pulse Rate (!) 48  SpO2 98 %  Assess: MEWS Score  MEWS Temp 0  MEWS Systolic 1  MEWS Pulse 1  MEWS RR 0  MEWS LOC 0  MEWS Score 2  MEWS Score Color Yellow  Assess: if the MEWS score is Yellow or Red  Were vital signs taken at a resting state? Yes  Focused Assessment No change from prior assessment  Early Detection of Sepsis Score *See Row Information* Low  MEWS guidelines implemented *See Row Information* Yes  Treat  MEWS Interventions Administered scheduled meds/treatments  Pain Scale 0-10  Pain Score 0  Multiple Pain Sites No  Take Vital Signs  Increase Vital Sign Frequency  Yellow: Q 2hr X 2 then Q 4hr X 2, if remains yellow, continue Q 4hrs  Escalate  MEWS: Escalate Yellow: discuss with charge nurse/RN and consider discussing with provider and RRT  Notify: Charge Nurse/RN  Name of Charge Nurse/RN Notified Rochel Brome, RN  Date Charge Nurse/RN Notified 12/04/19  Time Charge Nurse/RN Notified 1800  Notify: Provider  Provider Name/Title Shawna Clamp, MD  Date Provider Notified 12/04/19  Time Provider Notified [phone]  Notification Type Page  Notification Reason Change in status  Response See new orders  Date of Provider Response 12/04/19  Time of Provider Response 1748   281mLof normal saline bolus given

## 2019-12-04 NOTE — ED Notes (Signed)
Pt turned to R side. Son at bedside.

## 2019-12-05 LAB — CBC
HCT: 30.3 % — ABNORMAL LOW (ref 36.0–46.0)
Hemoglobin: 9.3 g/dL — ABNORMAL LOW (ref 12.0–15.0)
MCH: 30 pg (ref 26.0–34.0)
MCHC: 30.7 g/dL (ref 30.0–36.0)
MCV: 97.7 fL (ref 80.0–100.0)
Platelets: 191 10*3/uL (ref 150–400)
RBC: 3.1 MIL/uL — ABNORMAL LOW (ref 3.87–5.11)
RDW: 14.9 % (ref 11.5–15.5)
WBC: 6.8 10*3/uL (ref 4.0–10.5)
nRBC: 0 % (ref 0.0–0.2)

## 2019-12-05 LAB — GLUCOSE, CAPILLARY
Glucose-Capillary: 143 mg/dL — ABNORMAL HIGH (ref 70–99)
Glucose-Capillary: 45 mg/dL — ABNORMAL LOW (ref 70–99)
Glucose-Capillary: 141 mg/dL — ABNORMAL HIGH (ref 70–99)
Glucose-Capillary: 74 mg/dL (ref 70–99)
Glucose-Capillary: 140 mg/dL — ABNORMAL HIGH (ref 70–99)

## 2019-12-05 LAB — BASIC METABOLIC PANEL
Anion gap: 10 (ref 5–15)
BUN: 53 mg/dL — ABNORMAL HIGH (ref 8–23)
CO2: 19 mmol/L — ABNORMAL LOW (ref 22–32)
Calcium: 7.8 mg/dL — ABNORMAL LOW (ref 8.9–10.3)
Chloride: 102 mmol/L (ref 98–111)
Creatinine, Ser: 2.94 mg/dL — ABNORMAL HIGH (ref 0.44–1.00)
GFR calc Af Amer: 18 mL/min — ABNORMAL LOW (ref >60)
GFR calc non Af Amer: 15 mL/min — ABNORMAL LOW (ref >60)
Glucose, Bld: 54 mg/dL — ABNORMAL LOW (ref 70–99)
Potassium: 5.1 mmol/L (ref 3.5–5.1)
Sodium: 131 mmol/L — ABNORMAL LOW (ref 135–145)

## 2019-12-05 MED ORDER — AMLODIPINE BESYLATE 5 MG PO TABS
5.0000 mg | ORAL_TABLET | Freq: Every day | ORAL | Status: DC
  Administered 2019-12-05 – 2019-12-07 (×3): 5 mg via ORAL
  Filled 2019-12-05 (×3): qty 1

## 2019-12-05 MED ORDER — DEXTROSE 50 % IV SOLN: 1.0000 | Freq: Once | INTRAVENOUS | Status: AC

## 2019-12-05 MED ORDER — DEXTROSE 50 % IV SOLN
INTRAVENOUS | Status: AC
  Administered 2019-12-05: 50 mL via INTRAVENOUS
  Filled 2019-12-05: qty 50

## 2019-12-05 NOTE — Progress Notes (Signed)
Hypoglycemic Event  CBG: 45   Treatment: D50 25 mL (12.5 gm)  Symptoms: None  Follow-up CBG: Time:915 CBG Result:143  Possible Reasons for Event: Inadequate meal intake  Comments/MD notified:yes    Francesco Sor

## 2019-12-05 NOTE — Progress Notes (Signed)
PROGRESS NOTE    [name] [name]  LGX:[id number] DOB: 1946/03/25 DOA: 12/03/2019 PCP: Marguerita Merles, MD    Brief Narrative:  [name] [name]  is a 74 y.o. female with a known history of CHF, type 2 diabetes mellitus, hypertension, dyslipidemia and hypothyroidism, who presented to the emergency room with abnormal labs.  She has been having nausea and vomiting over the last several days with subsequent generalized weakness and lightheadedness.  At her skilled nursing facility she had blood work that revealed elevated BUN and creatinine.  She admits to mild epigastric abdominal pain with nausea and vomiting.  She has been constipated lately.  She feels her abdomen is "filled up".  No fever or chills.  No cough or wheezing or dyspnea.  No chest pain or palpitations.  No dysuria, oliguria or hematuria or flank pain. Labs revealed a potassium of 5.6 compared to 4.1 on 11/19/2019, sodium 132 with chloride 98 and a BUN of 75 up from 58 then, creatinine of 4.22 up from 2.45 then with magnesium of 3.1 albumin of 3.1 and total protein of 7.7.  CBC showed anemia with hemoglobin of 11 hematocrit 33.5 close to previous levels.  EKG showed sinus bradycardia with rate of 54.  This patient is admitted for acute on chronic kidney disease and hyperkalemia. Patient has been getting IV fluids, serum creatinine is improving, has received Lokelma for hyperkalemia,  nephrology consulted recommended to continue current management.    Assessment & Plan:   Active Problems:   AKI (acute kidney injury) ([place])  # Acute kidney injury likely prerenal superimposed on stage IV chronic kidney disease>>>> improving Patient has been having nausea and vomiting with constipation and subsequent dehydration. -The patient admitted to a medically monitored bed. -Continue hydration with IV normal saline. - avoid nephrotoxins. -Daily BMPs will be followed. -Renal ultrasound was ordered and revealed mild atrophic kidneys that represent  medical renal disease with no hydronephrosis or shadowing stone. -Nephrology consulted, recommended to continue current management.  2.  Hyperkalemia >>> resolved -This is clearly secondary to her acute kidney injury. -She was managed with p.o. [place] and will follow her potassium.  3.  Type 2 diabetes mellitus. - supplemental coverage with NovoLog and will continue her basal coverage. Her insulin has been on hold because she has hypoglycemic episodes.  4.  Essential hypertension. - continue Coreg and amlodipine. - Reduce the dose of amlodipine from 10-5 given bradycardia.  5.  Dyslipidemia. - continue statin therapy.  6.  Dementia. - continue Aricept.  7.  Hypothyroidism. -We will check TSH and continue Synthroid.  8.  DVT prophylaxis. -Subcutaneous Lovenox.  9. UTI:  UA shows trace leukocyte Estrace. She denies any urinary symptoms. Will give ceftriaxone IV once.   DVT prophylaxis: Lovenox Code Status: Full Family Communication: Discussed with son at bed side, Disposition Plan:  Dispo: The patient is from: Home  Anticipated d/c is to: Home  Anticipated d/c date is: 2 days  Patient currently is not medically stable to d/c.  Ongoing treatment for acute kidney injury.   Consultants:   Nephrology  Procedures:  Anti-infectives (From admission, onward)   Start     Dose/Rate Route Frequency Ordered Stop   12/05/19 1400  cefTRIAXone (ROCEPHIN) 1 g in sodium chloride 0.9 % 100 mL IVPB        1 g 200 mL/hr over 30 Minutes Intravenous Every 24 hours 12/04/19 2211     12/04/19 1515  cefTRIAXone (ROCEPHIN) 1 g in sodium chloride 0.9 %  100 mL IVPB        1 g 200 mL/hr over 30 Minutes Intravenous  Once 12/04/19 1502 12/04/19 1625       Antimicrobials: Anti-infectives (From admission, onward)   Start     Dose/Rate Route Frequency Ordered Stop   12/05/19 1400  cefTRIAXone (ROCEPHIN) 1 g in sodium chloride 0.9 % 100 mL IVPB         1 g 200 mL/hr over 30 Minutes Intravenous Every 24 hours 12/04/19 2211     12/04/19 1515  cefTRIAXone (ROCEPHIN) 1 g in sodium chloride 0.9 % 100 mL IVPB        1 g 200 mL/hr over 30 Minutes Intravenous  Once 12/04/19 1502 12/04/19 1625     Subjective: Patient was seen and examined at bedside. No overnight events. The patient reports nausea and vomiting has improved. she reports generalized weakness.  Son was at bedside explained in detail about the acute kidney injury in which is improving.  Patient does not want to be discharged to skilled nursing facility.  She wants to go home.  Objective: Vitals:   12/05/19 0314 12/05/19 0608 12/05/19 0953 12/05/19 1151  BP: (!) 104/42 (!) 115/33 (!) 129/42 122/61  Pulse: (!) 52 (!) 53 (!) 58 (!) 53  Resp: 15 16 16 12   Temp: 98.1 F (36.7 C) 97.9 F (36.6 C) 98.1 F (36.7 C) 97.7 F (36.5 C)  TempSrc: Oral Oral Oral   SpO2: 99% 97% 96% 100%  Weight:      Height:        Intake/Output Summary (Last 24 hours) at 12/05/2019 1301 Last data filed at 12/05/2019 0334 Gross per 24 hour  Intake 200 ml  Output 725 ml  Net -525 ml   Filed Weights   12/03/19 2052  Weight: 96.6 kg    Examination:  General exam: Appears calm and comfortable  Respiratory system: Clear to auscultation. Respiratory effort normal. Cardiovascular system: S1 & S2 heard, RRR. No JVD, murmurs, rubs, gallops or clicks. No pedal edema. Gastrointestinal system: Abdomen is nondistended, soft and nontender. No organomegaly or masses felt. Normal bowel sounds heard. Central nervous system: Alert and oriented. No focal neurological deficits. Extremities:  No edema, no cyanosis. Skin: No rashes, lesions or ulcers Psychiatry: Judgement and insight appear normal. Mood & affect appropriate.     Data Reviewed: I have personally reviewed following labs and imaging studies  CBC: Recent Labs  Lab 12/03/19 2053  WBC 7.3  NEUTROABS 3.8  HGB 11.0*  HCT 33.5*  MCV 91.8   PLT 144   Basic Metabolic Panel: Recent Labs  Lab 12/03/19 2053 12/04/19 0806 12/04/19 1400 12/05/19 0609  NA 132*  --  130* 131*  K 5.6* 6.4* 5.9* 5.1  CL 98  --  99 102  CO2 26  --  21* 19*  GLUCOSE 109*  --  75 54*  BUN 75*  --  62* 53*  CREATININE 4.22*  --  3.41* 2.94*  CALCIUM 8.2*  --  8.1* 7.8*  MG 3.1*  --   --   --    GFR: Estimated Creatinine Clearance: 20.3 mL/min (A) (by C-G formula based on SCr of 2.94 mg/dL (H)). Liver Function Tests: Recent Labs  Lab 12/03/19 2053  AST 34  ALT 15  ALKPHOS 85  BILITOT 0.8  PROT 7.7  ALBUMIN 3.1*   No results for input(s): LIPASE, AMYLASE in the last 168 hours. No results for input(s): AMMONIA in the last 168 hours.  Coagulation Profile: No results for input(s): INR, PROTIME in the last 168 hours. Cardiac Enzymes: No results for input(s): CKTOTAL, CKMB, CKMBINDEX, TROPONINI in the last 168 hours. BNP (last 3 results) No results for input(s): PROBNP in the last 8760 hours. HbA1C: No results for input(s): HGBA1C in the last 72 hours. CBG: Recent Labs  Lab 12/04/19 1631 12/04/19 2206 12/05/19 0847 12/05/19 0915 12/05/19 1148  GLUCAP 77 77 45* 143* 141*   Lipid Profile: No results for input(s): CHOL, HDL, LDLCALC, TRIG, CHOLHDL, LDLDIRECT in the last 72 hours. Thyroid Function Tests: Recent Labs    12/04/19 0241  TSH 0.895   Anemia Panel: No results for input(s): VITAMINB12, FOLATE, FERRITIN, TIBC, IRON, RETICCTPCT in the last 72 hours. Sepsis Labs: No results for input(s): PROCALCITON, LATICACIDVEN in the last 168 hours.  Recent Results (from the past 240 hour(s))  SARS Coronavirus 2 by RT PCR (hospital order, performed in [facility] hospital lab) Nasopharyngeal Nasopharyngeal Swab     Status: None   Collection Time: 12/03/19 11:35 PM   Specimen: Nasopharyngeal Swab  Result Value Ref Range Status   SARS Coronavirus 2 NEGATIVE NEGATIVE Final    Comment: (NOTE) SARS-CoV-2 target nucleic acids are NOT  DETECTED.  The SARS-CoV-2 RNA is generally detectable in upper and lower respiratory specimens during the acute phase of infection. The lowest concentration of SARS-CoV-2 viral copies this assay can detect is 250 copies / mL. A negative result does not preclude SARS-CoV-2 infection and should not be used as the sole basis for treatment or other patient management decisions.  A negative result may occur with improper specimen collection / handling, submission of specimen other than nasopharyngeal swab, presence of viral mutation(s) within the areas targeted by this assay, and inadequate number of viral copies (<250 copies / mL). A negative result must be combined with clinical observations, patient history, and epidemiological information.  Fact Sheet for Patients:   StrictlyIdeas.no  Fact Sheet for Healthcare Providers: BankingDealers.co.za  This test is not yet approved or  cleared by the [place] FDA and has been authorized for detection and/or diagnosis of SARS-CoV-2 by FDA under an Emergency Use Authorization (EUA).  This EUA will remain in effect (meaning this test can be used) for the duration of the COVID-19 declaration under Section 564(b)(1) of the Act, 21 U.S.C. section 360bbb-3(b)(1), unless the authorization is terminated or revoked sooner.  Performed at [facility], [address] [place], [place] [address]   MRSA PCR Screening     Status: None   Collection Time: 12/04/19 10:31 PM   Specimen: Nasopharyngeal  Result Value Ref Range Status   MRSA by PCR NEGATIVE NEGATIVE Final    Comment:        The GeneXpert MRSA Assay (FDA approved for NASAL specimens only), is one component of a comprehensive MRSA colonization surveillance program. It is not intended to diagnose MRSA infection nor to guide or monitor treatment for MRSA infections. Performed at [facility], [address]  [place], [place] [address]      Radiology Studies: DG Abd 1 View  Result Date: 12/04/2019 CLINICAL DATA:  74 year old female with abdominal pain. EXAM: ABDOMEN - 1 VIEW COMPARISON:  Abdominal radiograph dated 11/16/2019. FINDINGS: No bowel dilatation or evidence of obstruction. No free air. Renal vascular calcifications. Degenerative changes of the spine. The soft tissues are grossly unremarkable IMPRESSION: Negative. Electronically Signed   By: Anner Crete M.D.   On: 12/04/2019 00:52   US Renal  Result  Date: 12/03/2019 CLINICAL DATA:  74 year old female with acute renal insufficiency. EXAM: RENAL / URINARY TRACT ULTRASOUND COMPLETE COMPARISON:  Renal ultrasound dated 11/16/2019. FINDINGS: Right Kidney: Renal measurements: 8.7 x 4.8 x 4.6 cm = volume: 100 mL. Mild parenchyma atrophy and increased echogenicity. No hydronephrosis or shadowing stone. Left Kidney: Renal measurements: 8.8 x 5.1 x 4.2 cm = volume: 98 mL. Mildly atrophic and echogenic. No hydronephrosis or shadowing stone. Bladder: Appears normal for degree of bladder distention. Other: None. IMPRESSION: Mildly atrophic kidneys may represent medical renal disease. No hydronephrosis or shadowing stone. Electronically Signed   By: Anner Crete M.D.   On: 12/03/2019 22:26   Scheduled Meds: . amLODipine  5 mg Oral Daily  . aspirin  81 mg Oral Daily  . atorvastatin  40 mg Oral Daily  . carvedilol  25 mg Oral BID  . donepezil  10 mg Oral QHS  . heparin  5,000 Units Subcutaneous Q8H  . insulin aspart  0-9 Units Subcutaneous TID AC & HS  . levothyroxine  50 mcg Oral Daily  . polyethylene glycol  17 g Oral Daily  . sodium zirconium cyclosilicate  10 g Oral Daily   Continuous Infusions: . sodium chloride 100 mL/hr at 12/05/19 1150  . cefTRIAXone (ROCEPHIN)  IV 1 g (12/05/19 1236)     LOS: 2 days    Time spent: 25 mins.    Shawna Clamp, MD [facility]   If 7PM-7AM, please contact night-coverage

## 2019-12-05 NOTE — TOC Initial Note (Signed)
Transition of Care [employer]) - Initial/Assessment Note    Patient Details  Name: [name] [name] MRN: [id number] Date of Birth: Jul 22, 1945  Transition of Care [facility]) CM/SW Contact:    [name] [name] Phone Number:  [phone] 12/05/2019, 3:25 PM  Clinical Narrative:                  Patient presents from [facility] ED due to weakness.  Patient came from [facility], where she was receiving SNF services. Patient no longer has Medicare days. CSW explained TOC process, time-line and barriers to home health and SNF placement. CSW spoke with patient's son [name] [name], he stated the patient was doing well and able to complete ADLs without assistance until June 2021.  [name] [name] he lives with the patient and takes her to her appts, helps her get meds, and cooks for her but she does everything else on her own.  [name] [name] stated the patient has run out of Medicare days and they were working with [facility] to transfer the patient to long-term care w/ home health, w/ [facility] but patient became ill and was brought to [facility].  [name] [name] stated he would prefer for the patient to return home with home health and they will look for long-term facility once [name] [name] begins working full-time.  [name] [name] stated the patient does not want to return to [facility]. [name] [name] stated he would contact [facility] and let them know the change to home health and to stop the long-term care process.  CSW contacted [name] [name] [phone] [facility], to update her in patient's choice for home health over SNF.  CSW left voicemail.   Expected Discharge Plan: [place] Barriers to Discharge: Continued Medical Work up   Patient Goals and CMS Choice Patient states their goals for this hospitalization and ongoing recovery are:: [name] does not want to return to [facility]. CMS Medicare.gov Compare Post Acute Care list provided to:: Patient Represenative (must comment) [name], [name] (Son) [phone]) Choice offered to / list presented to  : Adult Children  Expected Discharge Plan and Services Expected Discharge Plan: [place] In-house Referral: Clinical Social Work   Post Acute Care Choice: [place] arrangements for the past 2 months: [place] Expected Discharge Date: 12/07/19                           [facility] Agency: [facility] Date [place]: 12/05/19 Time [place]: 4174 Representative spoke with at [place]: [name] [name] [phone]  Prior Living Arrangements/Services Living arrangements for the past 2 months: [place] Lives with:: Adult Children [name], [name] (Son) [phone]) Patient language and need for interpreter reviewed:: Yes Do you feel safe going back to the place where you live?: Yes      Need for Family Participation in Patient Care: Yes (Comment) Care giver support system in place?: Yes (comment)   Criminal Activity/Legal Involvement Pertinent to Current Situation/Hospitalization: No - Comment as needed  Activities of Daily Living Home Assistive Devices/Equipment: Gil[place] Rile (specify type) ADL Screening (condition at time of admission) Patient's cognitive ability adequate to safely complete daily activities?: No Is the patient deaf or have difficulty hearing?: No Does the patient have difficulty seeing, even when wearing glasses/contacts?: No Does the patient have difficulty concentrating, remembering, or making decisions?: Yes Patient able to express need for assistance with ADLs?: Yes Does the patient have difficulty  dressing or bathing?: Yes Independently performs ADLs?: No Communication: Independent Dressing (OT): Needs assistance Is this a change from baseline?: Pre-admission baseline Grooming: Independent Feeding: Independent Bathing: Needs assistance Is this a change from baseline?: Pre-admission baseline Toileting: Needs assistance Is this a change from baseline?: Pre-admission  baseline In/Out Bed: Needs assistance Is this a change from baseline?: Pre-admission baseline Walks in Home: Needs assistance Is this a change from baseline?: Pre-admission baseline Does the patient have difficulty walking or climbing stairs?: Yes Weakness of Legs: Both Weakness of Arms/Hands: Both  Permission Sought/Granted Permission sought to share information with : Family Supports Permission granted to share information with : Yes, Verbal Permission Granted  Share Information with NAME: [name] [name] [phone]  Permission granted to share info w AGENCY: [facility]        Emotional Assessment Appearance:: Appears stated age Attitude/Demeanor/Rapport: Engaged Affect (typically observed): Calm Orientation: : Oriented to Self, Oriented to Place, Oriented to Situation Alcohol / Substance Use: Not Applicable Psych Involvement: No (comment)  Admission diagnosis:  Hyperkalemia [E87.5] Acute abdominal pain [R10.9] AKI (acute kidney injury) ([place]) [N17.9] Patient Active Problem List   Diagnosis Date Noted  . AKI (acute kidney injury) ([place]) 11/15/2019  . Physical deconditioning 11/15/2019  . Aspiration pneumonia ([place]) 10/10/2019  . Hyperkalemia   . Rib pain on left side   . Leukocytosis 10/04/2019  . Fall at home, initial encounter 10/04/2019  . Cellulitis of right leg 10/04/2019  . CKD stage 4 due to type 2 diabetes mellitus ([place]) 10/04/2019  . Acute kidney injury superimposed on CKD ([place])   . RUQ pain   . Hyperlipidemia   . Right leg pain 10/03/2019  . Diabetic ulcer of right foot associated with type 2 diabetes mellitus, with fat layer exposed ([place]) 10/03/2019  . Cellulitis of right foot 10/03/2019  . HTN (hypertension) 10/03/2019  . Type 2 diabetes mellitus with stage 3 chronic kidney disease ([place]) 10/03/2019  . Hypothyroidism (acquired) 10/03/2019  . UTI (urinary tract infection) 10/03/2019  . Sepsis ([place]) 04/25/2018   PCP:  [name] Merles, MD Pharmacy:    [facility] [address] [place] [address] Phone: [phone] Fax: [phone]     Social Determinants of Health (SDOH) Interventions    Readmission Risk Interventions Readmission Risk Prevention Plan 11/19/2019  Transportation Screening Complete  PCP or Specialist Appt within 3-5 Days Complete  HRI or [place] Complete  Social Work Consult for [place] Planning/Counseling Complete  Palliative Care Screening Complete  Medication Review Press photographer) Complete  Some recent data might be hidden

## 2019-12-05 NOTE — Progress Notes (Signed)
[name] [name]  MRN: [id number]  DOB/AGE: Jun 16, 1945 74 y.o.  Primary Care Physician:Miles, Connye Burkitt, MD  Admit date: 12/03/2019  Chief Complaint:  Chief Complaint  Patient presents with  . Abnormal Lab  . Weakness    S-Pt presented on  12/03/2019 with  Chief Complaint  Patient presents with  . Abnormal Lab  . Weakness   Patient states she is feeling better than before  Medications  . amLODipine  5 mg Oral Daily  . aspirin  81 mg Oral Daily  . atorvastatin  40 mg Oral Daily  . carvedilol  25 mg Oral BID  . donepezil  10 mg Oral QHS  . heparin  5,000 Units Subcutaneous Q8H  . insulin aspart  0-9 Units Subcutaneous TID AC & HS  . levothyroxine  50 mcg Oral Daily  . polyethylene glycol  17 g Oral Daily  . sodium zirconium cyclosilicate  10 g Oral Daily         ZJQ:BHALP from the symptoms mentioned above,there are no other symptoms referable to all systems reviewed.  Physical Exam: Vital signs in last 24 hours: Temp:  [97.5 F (36.4 C)-98.1 F (36.7 C)] 98.1 F (36.7 C) (08/29 0953) Pulse Rate:  [48-58] 58 (08/29 0953) Resp:  [15-20] 16 (08/29 0953) BP: (100-129)/(33-58) 129/42 (08/29 0953) SpO2:  [96 %-100 %] 96 % (08/29 0953) Weight change:  Last BM Date: 12/05/19  Intake/Output from previous day: 08/28 0701 - 08/29 0700 In: 1100 [P.O.:100; I.V.:900; IV Piggyback:100] Out: 865 [Urine:865] No intake/output data recorded.   Physical Exam: General- pt is awake,alert, following commands  resp- No acute REsp distress, CTA B/L NO Rhonchi CVS- S1S2 regular in rate and rhythm GIT- BS+, soft, NT, ND EXT- NO LE Edema, no cyanosis   Lab Results: CBC Recent Labs    12/03/19 2053  WBC 7.3  HGB 11.0*  HCT 33.5*  PLT 203    BMET Recent Labs    12/04/19 1400 12/05/19 0609  NA 130* 131*  K 5.9* 5.1  CL 99 102  CO2 21* 19*  GLUCOSE 75 54*  BUN 62* 53*  CREATININE 3.41* 2.94*  CALCIUM 8.1* 7.8*   Creatinine trend 2021 4.2==>3.4==>2.9  July  admission 1.6--2.2 June admission 2.0--2.8 2020 1.5--2.2 2018 2.0 2017 1.8 2016 1.6--1.9 2015 1.4--1.7 2014 1.4 2013 1.9  MICRO Recent Results (from the past 240 hour(s))  SARS Coronavirus 2 by RT PCR (hospital order, performed in [place] hospital lab) Nasopharyngeal Nasopharyngeal Swab     Status: None   Collection Time: 12/03/19 11:35 PM   Specimen: Nasopharyngeal Swab  Result Value Ref Range Status   SARS Coronavirus 2 NEGATIVE NEGATIVE Final    Comment: (NOTE) SARS-CoV-2 target nucleic acids are NOT DETECTED.  The SARS-CoV-2 RNA is generally detectable in upper and lower respiratory specimens during the acute phase of infection. The lowest concentration of SARS-CoV-2 viral copies this assay can detect is 250 copies / mL. A negative result does not preclude SARS-CoV-2 infection and should not be used as the sole basis for treatment or other patient management decisions.  A negative result may occur with improper specimen collection / handling, submission of specimen other than nasopharyngeal swab, presence of viral mutation(s) within the areas targeted by this assay, and inadequate number of viral copies (<250 copies / mL). A negative result must be combined with clinical observations, patient history, and epidemiological information.  Fact Sheet for Patients:   StrictlyIdeas.no  Fact Sheet for Healthcare Providers: BankingDealers.co.za  This test is not yet approved or  cleared by the [place] and has been authorized for detection and/or diagnosis of SARS-CoV-2 by FDA under an Emergency Use Authorization (EUA).  This EUA will remain in effect (meaning this test can be used) for the duration of the COVID-19 declaration under Section 564(b)(1) of the Act, 21 U.S.C. section 360bbb-3(b)(1), unless the authorization is terminated or revoked sooner.  Performed at [facility], [address]  [place], [place] [address]   MRSA PCR Screening     Status: None   Collection Time: 12/04/19 10:31 PM   Specimen: Nasopharyngeal  Result Value Ref Range Status   MRSA by PCR NEGATIVE NEGATIVE Final    Comment:        The GeneXpert MRSA Assay (FDA approved for NASAL specimens only), is one component of a comprehensive MRSA colonization surveillance program. It is not intended to diagnose MRSA infection nor to guide or monitor treatment for MRSA infections. Performed at [facility], [address] [place], [place] [address]       Lab Results  Component Value Date   CALCIUM 7.8 (L) 12/05/2019   PHOS 3.8 11/19/2019       Renal ultrasound done on August 27 Right Kidney:  Renal measurements: 8.7 x 4.8 x 4.6 cm = volume: 100 mL. Mild parenchyma atrophy and increased echogenicity. No hydronephrosis or shadowing stone.  Left Kidney:  Renal measurements: 8.8 x 5.1 x 4.2 cm = volume: 98 mL. Mildly atrophic and echogenic. No hydronephrosis or shadowing stone.  Bladder:  Appears normal for degree of bladder distention.  Other:  None.  IMPRESSION: Mildly atrophic kidneys may represent medical renal disease. No hydronephrosis or shadowing stone.      Impression: 1)Renal  AKI secondary to ATN ATN secondary to hypovolemia/decreased p.o. intake Patient has AKI on CKD Patient has CKD stage IV Patient has CKD since 2013 Patient has CKD secondary to diabetes mellitus Patient CKD progression has been marked with multiple episodes of AKI  2)HTN Blood pressure stable   3)Anemia of chronic disease  HGb at goal (9--11)   4) secondary hyperparathyroidism -CKD Mineral-Bone Disorder   Secondary Hyperparathyroidism absent.  Phosphorus at goal.   5) hyperkalemia Patient hyperkalemia is being medically managed Patient has no EKG changes/patient is hemodynamically stable Patient responding to medical treatment  Now better  6) hyponatremia Most  likely hypovolemic Improving  7)Acid base Co2 at goal     Plan:  Will continue current care   Patient son was present in the room and I discussed pt kidney related issues with the family after asking her permission .      [name] s [name] 12/05/2019, 10:04 AM

## 2019-12-06 LAB — CBC
HCT: 29.8 % — ABNORMAL LOW (ref 36.0–46.0)
Hemoglobin: 9.8 g/dL — ABNORMAL LOW (ref 12.0–15.0)
MCH: 30.8 pg (ref 26.0–34.0)
MCHC: 32.9 g/dL (ref 30.0–36.0)
MCV: 93.7 fL (ref 80.0–100.0)
Platelets: 169 10*3/uL (ref 150–400)
RBC: 3.18 MIL/uL — ABNORMAL LOW (ref 3.87–5.11)
RDW: 14.8 % (ref 11.5–15.5)
WBC: 6.6 10*3/uL (ref 4.0–10.5)
nRBC: 0 % (ref 0.0–0.2)

## 2019-12-06 LAB — URINE CULTURE: Culture: >=100000 — AB

## 2019-12-06 LAB — GLUCOSE, CAPILLARY
Glucose-Capillary: 104 mg/dL — ABNORMAL HIGH (ref 70–99)
Glucose-Capillary: 118 mg/dL — ABNORMAL HIGH (ref 70–99)
Glucose-Capillary: 114 mg/dL — ABNORMAL HIGH (ref 70–99)
Glucose-Capillary: 109 mg/dL — ABNORMAL HIGH (ref 70–99)

## 2019-12-06 LAB — BASIC METABOLIC PANEL
Anion gap: 4 — ABNORMAL LOW (ref 5–15)
BUN: 41 mg/dL — ABNORMAL HIGH (ref 8–23)
CO2: 20 mmol/L — ABNORMAL LOW (ref 22–32)
Calcium: 7.9 mg/dL — ABNORMAL LOW (ref 8.9–10.3)
Chloride: 111 mmol/L (ref 98–111)
Creatinine, Ser: 2.31 mg/dL — ABNORMAL HIGH (ref 0.44–1.00)
GFR calc Af Amer: 24 mL/min — ABNORMAL LOW (ref >60)
GFR calc non Af Amer: 20 mL/min — ABNORMAL LOW (ref >60)
Glucose, Bld: 109 mg/dL — ABNORMAL HIGH (ref 70–99)
Potassium: 4.8 mmol/L (ref 3.5–5.1)
Sodium: 135 mmol/L (ref 135–145)

## 2019-12-06 LAB — MAGNESIUM: Magnesium: 2.5 mg/dL — ABNORMAL HIGH (ref 1.7–2.4)

## 2019-12-06 LAB — PHOSPHORUS: Phosphorus: 2.4 mg/dL — ABNORMAL LOW (ref 2.5–4.6)

## 2019-12-06 MED FILL — Heparin Sodium (Porcine) Inj 5000 Unit/ML: INTRAMUSCULAR | Qty: 1 | Status: AC

## 2019-12-06 MED FILL — Carvedilol Tab 3.125 MG: ORAL | Qty: 12.5 | Status: AC

## 2019-12-06 NOTE — Care Management Important Message (Signed)
Important Message  Patient Details  Name: [name] [name] MRN: [id number] Date of Birth: [birth year]   Medicare Important Message Given:  Yes     Dannette Barbara 12/06/2019, 1:23 PM

## 2019-12-06 NOTE — Progress Notes (Signed)
[facility]  ROUNDING NOTE   Subjective:   Patient found resting in bed today, in no acute distress,reports 'feeling better'.   Objective:  Vital signs in last 24 hours:  Temp:  [98.1 F (36.7 C)-98.2 F (36.8 C)] 98.1 F (36.7 C) (08/30 1141) Pulse Rate:  [54-75] 55 (08/30 1141) Resp:  [16-20] 18 (08/30 1141) BP: (109-137)/(41-56) 137/53 (08/30 1141) SpO2:  [98 %-99 %] 99 % (08/30 1141)  Weight change:  Filed Weights   12/03/19 2052  Weight: 96.6 kg    Intake/Output: I/O last 3 completed shifts: In: 5220.5 [P.O.:100; I.V.:5020.5; IV Piggyback:100] Out: 400 [Urine:400]   Intake/Output this shift:  Total I/O In: -  Out: 800 [Urine:800]  Physical Exam: General: Resting in bed, in no acute distress  Head: Normocephalic, atraumatic.   Eyes: Sclerae and conjunctivae clear  Neck: Supple, trachea midline  Lungs:  Normal and symmetrical respiratory effort, lungs clear  Heart: S1S2 Regular rate and rhythm  Abdomen:  Soft, nontender,   Extremities: No peripheral edema.  Neurologic: Alert, able to answer simple questions appropriately  Skin: No acute  Lesions or rashes noted       Basic Metabolic Panel: Recent Labs  Lab 12/03/19 2053 12/03/19 2053 12/04/19 0806 12/04/19 1400 12/05/19 0609 12/06/19 0732  NA 132*  --   --  130* 131* 135  K 5.6*  --  6.4* 5.9* 5.1 4.8  CL 98  --   --  99 102 111  CO2 26  --   --  21* 19* 20*  GLUCOSE 109*  --   --  75 54* 109*  BUN 75*  --   --  62* 53* 41*  CREATININE 4.22*  --   --  3.41* 2.94* 2.31*  CALCIUM 8.2*   < >  --  8.1* 7.8* 7.9*  MG 3.1*  --   --   --   --  2.5*  PHOS  --   --   --   --   --  2.4*   < > = values in this interval not displayed.    Liver Function Tests: Recent Labs  Lab 12/03/19 2053  AST 34  ALT 15  ALKPHOS 85  BILITOT 0.8  PROT 7.7  ALBUMIN 3.1*   No results for input(s): LIPASE, AMYLASE in the last 168 hours. No results for input(s): AMMONIA in the last 168  hours.  CBC: Recent Labs  Lab 12/03/19 2053 12/05/19 0609 12/06/19 0732  WBC 7.3 6.8 6.6  NEUTROABS 3.8  --   --   HGB 11.0* 9.3* 9.8*  HCT 33.5* 30.3* 29.8*  MCV 91.8 97.7 93.7  PLT 203 191 169    Cardiac Enzymes: No results for input(s): CKTOTAL, CKMB, CKMBINDEX, TROPONINI in the last 168 hours.  BNP: Invalid input(s): POCBNP  CBG: Recent Labs  Lab 12/05/19 1148 12/05/19 1604 12/05/19 2137 12/06/19 0751 12/06/19 1138  GLUCAP 141* 74 140* 104* 118*    Microbiology: Results for orders placed or performed during the hospital encounter of 12/03/19  SARS Coronavirus 2 by RT PCR (hospital order, performed in [facility] hospital lab) Nasopharyngeal Nasopharyngeal Swab     Status: None   Collection Time: 12/03/19 11:35 PM   Specimen: Nasopharyngeal Swab  Result Value Ref Range Status   SARS Coronavirus 2 NEGATIVE NEGATIVE Final    Comment: (NOTE) SARS-CoV-2 target nucleic acids are NOT DETECTED.  The SARS-CoV-2 RNA is generally detectable in upper and lower respiratory specimens during the acute  phase of infection. The lowest concentration of SARS-CoV-2 viral copies this assay can detect is 250 copies / mL. A negative result does not preclude SARS-CoV-2 infection and should not be used as the sole basis for treatment or other patient management decisions.  A negative result may occur with improper specimen collection / handling, submission of specimen other than nasopharyngeal swab, presence of viral mutation(s) within the areas targeted by this assay, and inadequate number of viral copies (<250 copies / mL). A negative result must be combined with clinical observations, patient history, and epidemiological information.  Fact Sheet for Patients:   StrictlyIdeas.no  Fact Sheet for Healthcare Providers: BankingDealers.co.za  This test is not yet approved or  cleared by the [place] FDA and has been authorized  for detection and/or diagnosis of SARS-CoV-2 by FDA under an Emergency Use Authorization (EUA).  This EUA will remain in effect (meaning this test can be used) for the duration of the COVID-19 declaration under Section 564(b)(1) of the Act, 21 U.S.C. section 360bbb-3(b)(1), unless the authorization is terminated or revoked sooner.  Performed at [facility], [address] [place], [place] [address]   Urine Culture     Status: Abnormal   Collection Time: 12/04/19 11:59 AM   Specimen: Urine, Clean Catch  Result Value Ref Range Status   Specimen Description   Final    URINE, CLEAN CATCH Performed at [facility], [address] [place], [place] [address]    Special Requests   Final    NONE Performed at [facility], [address] [place], [place] [address]    Culture >=100,000 COLONIES/mL ENTEROBACTER AEROGENES (A)  Final   Report Status 12/06/2019 FINAL  Final   Organism ID, Bacteria ENTEROBACTER AEROGENES (A)  Final      Susceptibility   Enterobacter aerogenes - MIC*    CEFAZOLIN >=64 RESISTANT Resistant     CEFTRIAXONE <=0.25 SENSITIVE Sensitive     CIPROFLOXACIN <=0.25 SENSITIVE Sensitive     GENTAMICIN <=1 SENSITIVE Sensitive     IMIPENEM 2 SENSITIVE Sensitive     NITROFURANTOIN 64 INTERMEDIATE Intermediate     TRIMETH/SULFA <=20 SENSITIVE Sensitive     PIP/TAZO <=4 SENSITIVE Sensitive     * >=100,000 COLONIES/mL ENTEROBACTER AEROGENES  MRSA PCR Screening     Status: None   Collection Time: 12/04/19 10:31 PM   Specimen: Nasopharyngeal  Result Value Ref Range Status   MRSA by PCR NEGATIVE NEGATIVE Final    Comment:        The GeneXpert MRSA Assay (FDA approved for NASAL specimens only), is one component of a comprehensive MRSA colonization surveillance program. It is not intended to diagnose MRSA infection nor to guide or monitor treatment for MRSA infections. Performed at [facility], [address] [place],  [place] [address]     Coagulation Studies: No results for input(s): LABPROT, INR in the last 72 hours.  Urinalysis: Recent Labs    12/04/19 1159  COLORURINE YELLOW*  LABSPEC 1.013  PHURINE 7.0  GLUCOSEU NEGATIVE  HGBUR SMALL*  BILIRUBINUR NEGATIVE  KETONESUR NEGATIVE  PROTEINUR NEGATIVE  NITRITE NEGATIVE  LEUKOCYTESUR TRACE*      Imaging: No results found.   Medications:   . sodium chloride 100 mL/hr at 12/06/19 0718  . cefTRIAXone (ROCEPHIN)  IV 1 g (12/06/19 1416)   . amLODipine  5 mg Oral Daily  . aspirin  81 mg Oral Daily  . atorvastatin  40 mg Oral Daily  . carvedilol  25 mg Oral BID  . donepezil  10 mg Oral QHS  . heparin  5,000 Units Subcutaneous Q8H  . insulin aspart  0-9 Units Subcutaneous TID AC & HS  . levothyroxine  50 mcg Oral Daily  . polyethylene glycol  17 g Oral Daily  . sodium zirconium cyclosilicate  10 g Oral Daily   acetaminophen **OR** acetaminophen, ondansetron **OR** ondansetron (ZOFRAN) IV, traZODone  Assessment/ Plan:  Ms. CHRYS LANDGREBE is a 74 y.o. African American female with hypothyroidism, hypertension, hyperlipidemia, diabetes mellitus type II insulin dependent, who was admitted to [facility] on 11/15/2019 for Dehydration [W09.8] Metabolic acidosis [J19.1] AKI (acute kidney injury) ([place]) [N17.9]  #Renal AKI secondary to ATN  Patient has h/o CKD Stage IV, developed AKI secondary to hypovolemia and decreased PO intake. Creatinine down to  2.31 today. Will continue IV fluids and encourage PO intake  # Hypertension Blood pressure readings within normal range  # Anemia of Chronic Disease Hgb today is 9.8, at goal, will continue monitoring  #Hyperkalemia  K+ within normal range,  4.8 today  #Hyponatremia  Possibly caused by hypovolemia, corrected with fluid, Na + today is 135. Patient is more alert and oriented now.   Patient was seen and examined with physician assistant, Crosby Oyster DNP. Additionally continue lokelma and hold  quinapril due to hyperkalemia. Above plan was discussed and agreed upon on the signing of this note.   Lavonia Dana, [facility]  8/30/20214:32 PM     LOS: 3 Lavonia Dana 8/30/20212:48 PM

## 2019-12-06 NOTE — Progress Notes (Signed)
PROGRESS NOTE    [name] [name]  ERD:[id number] DOB: 1945/06/27 DOA: 12/03/2019 PCP: Marguerita Merles, MD    Brief Narrative:  [name] [name]  is a 74 y.o. female with a known history of CHF, type 2 diabetes mellitus, hypertension, dyslipidemia and hypothyroidism, who presented to the emergency room with abnormal labs.  She has been having nausea and vomiting over the last several days with subsequent generalized weakness and lightheadedness.  At her skilled nursing facility she had blood work that revealed elevated BUN and creatinine.  She admits to mild epigastric abdominal pain with nausea and vomiting.  She has been constipated lately.  She feels her abdomen is "filled up".  No fever or chills.  No cough or wheezing or dyspnea.  No chest pain or palpitations.  No dysuria, oliguria or hematuria or flank pain. Labs revealed a potassium of 5.6 compared to 4.1 on 11/19/2019, sodium 132 with chloride 98 and a BUN of 75 up from 58 then, creatinine of 4.22 up from 2.45 then with magnesium of 3.1 albumin of 3.1 and total protein of 7.7.  CBC showed anemia with hemoglobin of 11 hematocrit 33.5 close to previous levels.  EKG showed sinus bradycardia with rate of 54.  This patient is admitted for acute on chronic kidney disease and hyperkalemia. Patient has been getting IV fluids, serum creatinine is improving, has received Lokelma for hyperkalemia,  nephrology consulted recommended to continue current management.    Assessment & Plan:   Active Problems:   AKI (acute kidney injury) ([place])  # Acute kidney injury likely prerenal superimposed on stage IV chronic kidney disease>>>> improving Patient has been having nausea and vomiting with constipation and subsequent dehydration. -The patient admitted to a medically monitored bed. -Continue hydration with IV normal saline. - avoid nephrotoxins. -Daily BMPs will be followed. -Renal ultrasound was ordered and revealed mild atrophic kidneys that represent  medical renal disease with no hydronephrosis or shadowing stone. -Nephrology consulted, recommended to continue current management.  2.  Hyperkalemia >>> resolved -This is clearly secondary to her acute kidney injury. -She was managed with p.o. [place] and will follow her potassium.  3.  Type 2 diabetes mellitus. - supplemental coverage with NovoLog and will continue her basal coverage.    Her insulin has been on hold because she has hypoglycemic episodes.  4.  Essential hypertension. - Continue Coreg and amlodipine. - Reduce the dose of amlodipine from 10-5 given bradycardia.  5.  Dyslipidemia. - continue statin therapy.  6.  Dementia. - continue Aricept.  7.  Hypothyroidism. -We will check TSH and continue Synthroid.  8.  DVT prophylaxis. -Subcutaneous Lovenox.  9. UTI:  UA shows trace leukocyte Estrace. She denies any urinary symptoms. Will give ceftriaxone IV once.   DVT prophylaxis: Lovenox Code Status: Full Family Communication: Discussed with son at bed side, Disposition Plan:  Dispo: The patient is from: Home  Anticipated d/c is to: Home with home services.  Anticipated d/c date is: 2 days  Patient currently is not medically stable to d/c.  Ongoing treatment for acute kidney injury.   Consultants:   Nephrology  Procedures:  Anti-infectives (From admission, onward)   Start     Dose/Rate Route Frequency Ordered Stop   12/05/19 1400  cefTRIAXone (ROCEPHIN) 1 g in sodium chloride 0.9 % 100 mL IVPB        1 g 200 mL/hr over 30 Minutes Intravenous Every 24 hours 12/04/19 2211     12/04/19 1515  cefTRIAXone (ROCEPHIN) 1  g in sodium chloride 0.9 % 100 mL IVPB        1 g 200 mL/hr over 30 Minutes Intravenous  Once 12/04/19 1502 12/04/19 1625       Antimicrobials: Anti-infectives (From admission, onward)   Start     Dose/Rate Route Frequency Ordered Stop   12/05/19 1400  cefTRIAXone (ROCEPHIN) 1 g in sodium  chloride 0.9 % 100 mL IVPB        1 g 200 mL/hr over 30 Minutes Intravenous Every 24 hours 12/04/19 2211     12/04/19 1515  cefTRIAXone (ROCEPHIN) 1 g in sodium chloride 0.9 % 100 mL IVPB        1 g 200 mL/hr over 30 Minutes Intravenous  Once 12/04/19 1502 12/04/19 1625     Subjective: Patient was seen and examined at bedside. No overnight events. She reports generalized weakness.  Patient does not want to be discharged to skilled nursing facility.  She wants to go home.  Objective: Vitals:   12/05/19 1151 12/05/19 2038 12/06/19 0443 12/06/19 1141  BP: 122/61 (!) 119/56 (!) 109/41 (!) 137/53  Pulse: (!) 53 (!) 54 75 (!) 55  Resp: 12 16 20 18   Temp: 97.7 F (36.5 C) 98.2 F (36.8 C) 98.2 F (36.8 C) 98.1 F (36.7 C)  TempSrc:  Oral Oral Oral  SpO2: 100% 98% 99% 99%  Weight:      Height:        Intake/Output Summary (Last 24 hours) at 12/06/2019 1400 Last data filed at 12/06/2019 1148 Gross per 24 hour  Intake 5120.5 ml  Output 800 ml  Net 4320.5 ml   Filed Weights   12/03/19 2052  Weight: 96.6 kg    Examination:  General exam: Appears calm and comfortable  Respiratory system: Clear to auscultation. Respiratory effort normal. Cardiovascular system: S1 & S2 heard, RRR. No JVD, murmurs, rubs, gallops or clicks. No pedal edema. Gastrointestinal system: Abdomen is nondistended, soft and nontender. No organomegaly or masses felt. Normal bowel sounds heard. Central nervous system: Alert and oriented. No focal neurological deficits. Extremities:  No edema, no cyanosis. Skin: No rashes, lesions or ulcers Psychiatry: Judgement and insight appear normal. Mood & affect appropriate.     Data Reviewed: I have personally reviewed following labs and imaging studies  CBC: Recent Labs  Lab 12/03/19 2053 12/05/19 0609 12/06/19 0732  WBC 7.3 6.8 6.6  NEUTROABS 3.8  --   --   HGB 11.0* 9.3* 9.8*  HCT 33.5* 30.3* 29.8*  MCV 91.8 97.7 93.7  PLT 203 191 967   Basic Metabolic  Panel: Recent Labs  Lab 12/03/19 2053 12/04/19 0806 12/04/19 1400 12/05/19 0609 12/06/19 0732  NA 132*  --  130* 131* 135  K 5.6* 6.4* 5.9* 5.1 4.8  CL 98  --  99 102 111  CO2 26  --  21* 19* 20*  GLUCOSE 109*  --  75 54* 109*  BUN 75*  --  62* 53* 41*  CREATININE 4.22*  --  3.41* 2.94* 2.31*  CALCIUM 8.2*  --  8.1* 7.8* 7.9*  MG 3.1*  --   --   --  2.5*  PHOS  --   --   --   --  2.4*   GFR: Estimated Creatinine Clearance: 25.9 mL/min (A) (by C-G formula based on SCr of 2.31 mg/dL (H)). Liver Function Tests: Recent Labs  Lab 12/03/19 2053  AST 34  ALT 15  ALKPHOS 85  BILITOT 0.8  PROT 7.7  ALBUMIN 3.1*   No results for input(s): LIPASE, AMYLASE in the last 168 hours. No results for input(s): AMMONIA in the last 168 hours. Coagulation Profile: No results for input(s): INR, PROTIME in the last 168 hours. Cardiac Enzymes: No results for input(s): CKTOTAL, CKMB, CKMBINDEX, TROPONINI in the last 168 hours. BNP (last 3 results) No results for input(s): PROBNP in the last 8760 hours. HbA1C: No results for input(s): HGBA1C in the last 72 hours. CBG: Recent Labs  Lab 12/05/19 1148 12/05/19 1604 12/05/19 2137 12/06/19 0751 12/06/19 1138  GLUCAP 141* 74 140* 104* 118*   Lipid Profile: No results for input(s): CHOL, HDL, LDLCALC, TRIG, CHOLHDL, LDLDIRECT in the last 72 hours. Thyroid Function Tests: Recent Labs    12/04/19 0241  TSH 0.895   Anemia Panel: No results for input(s): VITAMINB12, FOLATE, FERRITIN, TIBC, IRON, RETICCTPCT in the last 72 hours. Sepsis Labs: No results for input(s): PROCALCITON, LATICACIDVEN in the last 168 hours.  Recent Results (from the past 240 hour(s))  SARS Coronavirus 2 by RT PCR (hospital order, performed in [facility] hospital lab) Nasopharyngeal Nasopharyngeal Swab     Status: None   Collection Time: 12/03/19 11:35 PM   Specimen: Nasopharyngeal Swab  Result Value Ref Range Status   SARS Coronavirus 2 NEGATIVE NEGATIVE Final     Comment: (NOTE) SARS-CoV-2 target nucleic acids are NOT DETECTED.  The SARS-CoV-2 RNA is generally detectable in upper and lower respiratory specimens during the acute phase of infection. The lowest concentration of SARS-CoV-2 viral copies this assay can detect is 250 copies / mL. A negative result does not preclude SARS-CoV-2 infection and should not be used as the sole basis for treatment or other patient management decisions.  A negative result may occur with improper specimen collection / handling, submission of specimen other than nasopharyngeal swab, presence of viral mutation(s) within the areas targeted by this assay, and inadequate number of viral copies (<250 copies / mL). A negative result must be combined with clinical observations, patient history, and epidemiological information.  Fact Sheet for Patients:   StrictlyIdeas.no  Fact Sheet for Healthcare Providers: BankingDealers.co.za  This test is not yet approved or  cleared by the [place] FDA and has been authorized for detection and/or diagnosis of SARS-CoV-2 by FDA under an Emergency Use Authorization (EUA).  This EUA will remain in effect (meaning this test can be used) for the duration of the COVID-19 declaration under Section 564(b)(1) of the Act, 21 U.S.C. section 360bbb-3(b)(1), unless the authorization is terminated or revoked sooner.  Performed at [facility], [address] [place], [place] [address]   Urine Culture     Status: Abnormal   Collection Time: 12/04/19 11:59 AM   Specimen: Urine, Clean Catch  Result Value Ref Range Status   Specimen Description   Final    URINE, CLEAN CATCH Performed at [facility], [address] [place], [place] [address]    Special Requests   Final    NONE Performed at [facility], [address] [place], [place] [address]    Culture >=100,000 COLONIES/mL ENTEROBACTER AEROGENES  (A)  Final   Report Status 12/06/2019 FINAL  Final   Organism ID, Bacteria ENTEROBACTER AEROGENES (A)  Final      Susceptibility   Enterobacter aerogenes - MIC*    CEFAZOLIN >=64 RESISTANT Resistant     CEFTRIAXONE <=0.25 SENSITIVE Sensitive     CIPROFLOXACIN <=0.25 SENSITIVE Sensitive     GENTAMICIN <=1 SENSITIVE Sensitive  IMIPENEM 2 SENSITIVE Sensitive     NITROFURANTOIN 64 INTERMEDIATE Intermediate     TRIMETH/SULFA <=20 SENSITIVE Sensitive     PIP/TAZO <=4 SENSITIVE Sensitive     * >=100,000 COLONIES/mL ENTEROBACTER AEROGENES  MRSA PCR Screening     Status: None   Collection Time: 12/04/19 10:31 PM   Specimen: Nasopharyngeal  Result Value Ref Range Status   MRSA by PCR NEGATIVE NEGATIVE Final    Comment:        The GeneXpert MRSA Assay (FDA approved for NASAL specimens only), is one component of a comprehensive MRSA colonization surveillance program. It is not intended to diagnose MRSA infection nor to guide or monitor treatment for MRSA infections. Performed at [facility], [address] [place], [place] [address]      Radiology Studies: No results found. Scheduled Meds: . amLODipine  5 mg Oral Daily  . aspirin  81 mg Oral Daily  . atorvastatin  40 mg Oral Daily  . carvedilol  25 mg Oral BID  . donepezil  10 mg Oral QHS  . heparin  5,000 Units Subcutaneous Q8H  . insulin aspart  0-9 Units Subcutaneous TID AC & HS  . levothyroxine  50 mcg Oral Daily  . polyethylene glycol  17 g Oral Daily  . sodium zirconium cyclosilicate  10 g Oral Daily   Continuous Infusions: . sodium chloride 100 mL/hr at 12/06/19 0718  . cefTRIAXone (ROCEPHIN)  IV Stopped (12/05/19 1306)     LOS: 3 days    Time spent: 25 mins.    Shawna Clamp, MD [facility]   If 7PM-7AM, please contact night-coverage

## 2019-12-07 LAB — BASIC METABOLIC PANEL
Anion gap: 6 (ref 5–15)
BUN: 33 mg/dL — ABNORMAL HIGH (ref 8–23)
CO2: 16 mmol/L — ABNORMAL LOW (ref 22–32)
Calcium: 8.2 mg/dL — ABNORMAL LOW (ref 8.9–10.3)
Chloride: 113 mmol/L — ABNORMAL HIGH (ref 98–111)
Creatinine, Ser: 1.92 mg/dL — ABNORMAL HIGH (ref 0.44–1.00)
GFR calc Af Amer: 29 mL/min — ABNORMAL LOW (ref >60)
GFR calc non Af Amer: 25 mL/min — ABNORMAL LOW (ref >60)
Glucose, Bld: 126 mg/dL — ABNORMAL HIGH (ref 70–99)
Potassium: 4.4 mmol/L (ref 3.5–5.1)
Sodium: 135 mmol/L (ref 135–145)

## 2019-12-07 LAB — HEMOGLOBIN AND HEMATOCRIT, BLOOD
HCT: 31 % — ABNORMAL LOW (ref 36.0–46.0)
Hemoglobin: 10.3 g/dL — ABNORMAL LOW (ref 12.0–15.0)

## 2019-12-07 LAB — GLUCOSE, CAPILLARY
Glucose-Capillary: 107 mg/dL — ABNORMAL HIGH (ref 70–99)
Glucose-Capillary: 130 mg/dL — ABNORMAL HIGH (ref 70–99)
Glucose-Capillary: 145 mg/dL — ABNORMAL HIGH (ref 70–99)

## 2019-12-07 LAB — MAGNESIUM: Magnesium: 2.5 mg/dL — ABNORMAL HIGH (ref 1.7–2.4)

## 2019-12-07 LAB — PHOSPHORUS: Phosphorus: 2.3 mg/dL — ABNORMAL LOW (ref 2.5–4.6)

## 2019-12-07 MED ORDER — SODIUM BICARBONATE 650 MG PO TABS
650.0000 mg | ORAL_TABLET | Freq: Three times a day (TID) | ORAL | 0 refills | Status: DC
Start: 2019-12-07 — End: 2020-05-17

## 2019-12-07 MED ORDER — SODIUM BICARBONATE 650 MG PO TABS
650.0000 mg | ORAL_TABLET | Freq: Three times a day (TID) | ORAL | Status: DC
  Administered 2019-12-07: 650 mg via ORAL
  Filled 2019-12-07: qty 1

## 2019-12-07 MED ORDER — K PHOS MONO-SOD PHOS DI & MONO 155-852-130 MG PO TABS
250.0000 mg | ORAL_TABLET | Freq: Two times a day (BID) | ORAL | Status: DC
  Administered 2019-12-07: 250 mg via ORAL
  Filled 2019-12-07 (×2): qty 1

## 2019-12-07 NOTE — Progress Notes (Signed)
[facility]  ROUNDING NOTE   Subjective:   Son at bedside.   Objective:  Vital signs in last 24 hours:  Temp:  [98.1 F (36.7 C)-98.5 F (36.9 C)] 98.5 F (36.9 C) (08/31 1210) Pulse Rate:  [56-58] 57 (08/31 1210) Resp:  [14-16] 16 (08/31 1210) BP: (121-136)/(59-66) 121/65 (08/31 1210) SpO2:  [95 %-100 %] 96 % (08/31 1210)  Weight change:  Filed Weights   12/03/19 2052  Weight: 96.6 kg    Intake/Output: I/O last 3 completed shifts: In: 2552.1 [P.O.:120; I.V.:2332.1; IV Piggyback:100] Out: 1000 [Urine:1000]   Intake/Output this shift:  Total I/O In: 1713.1 [I.V.:1713.1] Out: -   Physical Exam: General: Resting in bed, in no acute distress  Head: Normocephalic, atraumatic.   Eyes: Sclerae and conjunctivae clear  Neck: Supple, trachea midline  Lungs:  Normal and symmetrical respiratory effort, lungs clear  Heart: S1S2 Regular rate and rhythm  Abdomen:  Soft, nontender,   Extremities: No peripheral edema.  Neurologic: Alert, able to answer simple questions appropriately  Skin: No acute  Lesions or rashes noted       Basic Metabolic Panel: Recent Labs  Lab 12/03/19 2053 12/03/19 2053 12/04/19 0806 12/04/19 1400 12/04/19 1400 12/05/19 0609 12/06/19 0732 12/07/19 0645  NA 132*  --   --  130*  --  131* 135 135  K 5.6*   < > 6.4* 5.9*  --  5.1 4.8 4.4  CL 98  --   --  99  --  102 111 113*  CO2 26  --   --  21*  --  19* 20* 16*  GLUCOSE 109*  --   --  75  --  54* 109* 126*  BUN 75*  --   --  62*  --  53* 41* 33*  CREATININE 4.22*  --   --  3.41*  --  2.94* 2.31* 1.92*  CALCIUM 8.2*   < >  --  8.1*   < > 7.8* 7.9* 8.2*  MG 3.1*  --   --   --   --   --  2.5* 2.5*  PHOS  --   --   --   --   --   --  2.4* 2.3*   < > = values in this interval not displayed.    Liver Function Tests: Recent Labs  Lab 12/03/19 2053  AST 34  ALT 15  ALKPHOS 85  BILITOT 0.8  PROT 7.7  ALBUMIN 3.1*   No results for input(s): LIPASE, AMYLASE in the last 168  hours. No results for input(s): AMMONIA in the last 168 hours.  CBC: Recent Labs  Lab 12/03/19 2053 12/05/19 0609 12/06/19 0732 12/07/19 0645  WBC 7.3 6.8 6.6  --   NEUTROABS 3.8  --   --   --   HGB 11.0* 9.3* 9.8* 10.3*  HCT 33.5* 30.3* 29.8* 31.0*  MCV 91.8 97.7 93.7  --   PLT 203 191 169  --     Cardiac Enzymes: No results for input(s): CKTOTAL, CKMB, CKMBINDEX, TROPONINI in the last 168 hours.  BNP: Invalid input(s): POCBNP  CBG: Recent Labs  Lab 12/06/19 1138 12/06/19 1643 12/06/19 2106 12/07/19 0741 12/07/19 1207  GLUCAP 118* 114* 109* 107* 130*    Microbiology: Results for orders placed or performed during the hospital encounter of 12/03/19  SARS Coronavirus 2 by RT PCR (hospital order, performed in [facility] hospital lab) Nasopharyngeal Nasopharyngeal Swab     Status: None  Collection Time: 12/03/19 11:35 PM   Specimen: Nasopharyngeal Swab  Result Value Ref Range Status   SARS Coronavirus 2 NEGATIVE NEGATIVE Final    Comment: (NOTE) SARS-CoV-2 target nucleic acids are NOT DETECTED.  The SARS-CoV-2 RNA is generally detectable in upper and lower respiratory specimens during the acute phase of infection. The lowest concentration of SARS-CoV-2 viral copies this assay can detect is 250 copies / mL. A negative result does not preclude SARS-CoV-2 infection and should not be used as the sole basis for treatment or other patient management decisions.  A negative result may occur with improper specimen collection / handling, submission of specimen other than nasopharyngeal swab, presence of viral mutation(s) within the areas targeted by this assay, and inadequate number of viral copies (<250 copies / mL). A negative result must be combined with clinical observations, patient history, and epidemiological information.  Fact Sheet for Patients:   StrictlyIdeas.no  Fact Sheet for Healthcare  Providers: BankingDealers.co.za  This test is not yet approved or  cleared by the [place] FDA and has been authorized for detection and/or diagnosis of SARS-CoV-2 by FDA under an Emergency Use Authorization (EUA).  This EUA will remain in effect (meaning this test can be used) for the duration of the COVID-19 declaration under Section 564(b)(1) of the Act, 21 U.S.C. section 360bbb-3(b)(1), unless the authorization is terminated or revoked sooner.  Performed at [facility], [address] [place], [place] [address]   Urine Culture     Status: Abnormal   Collection Time: 12/04/19 11:59 AM   Specimen: Urine, Clean Catch  Result Value Ref Range Status   Specimen Description   Final    URINE, CLEAN CATCH Performed at [facility], [address], [place], [place] [address]    Special Requests   Final    NONE Performed at [facility], [address] [place], [place] [address]    Culture >=100,000 COLONIES/mL ENTEROBACTER AEROGENES (A)  Final   Report Status 12/06/2019 FINAL  Final   Organism ID, Bacteria ENTEROBACTER AEROGENES (A)  Final      Susceptibility   Enterobacter aerogenes - MIC*    CEFAZOLIN >=64 RESISTANT Resistant     CEFTRIAXONE <=0.25 SENSITIVE Sensitive     CIPROFLOXACIN <=0.25 SENSITIVE Sensitive     GENTAMICIN <=1 SENSITIVE Sensitive     IMIPENEM 2 SENSITIVE Sensitive     NITROFURANTOIN 64 INTERMEDIATE Intermediate     TRIMETH/SULFA <=20 SENSITIVE Sensitive     PIP/TAZO <=4 SENSITIVE Sensitive     * >=100,000 COLONIES/mL ENTEROBACTER AEROGENES  MRSA PCR Screening     Status: None   Collection Time: 12/04/19 10:31 PM   Specimen: Nasopharyngeal  Result Value Ref Range Status   MRSA by PCR NEGATIVE NEGATIVE Final    Comment:        The GeneXpert MRSA Assay (FDA approved for NASAL specimens only), is one component of a comprehensive MRSA colonization surveillance program. It is not intended to  diagnose MRSA infection nor to guide or monitor treatment for MRSA infections. Performed at [facility], [address] [place], [place] [address]     Coagulation Studies: No results for input(s): LABPROT, INR in the last 72 hours.  Urinalysis: No results for input(s): COLORURINE, LABSPEC, PHURINE, GLUCOSEU, HGBUR, BILIRUBINUR, KETONESUR, PROTEINUR, UROBILINOGEN, NITRITE, LEUKOCYTESUR in the last 72 hours.  Invalid input(s): APPERANCEUR    Imaging: No results found.   Medications:   . sodium chloride Stopped (12/07/19 1150)   .  amLODipine  5 mg Oral Daily  . aspirin  81 mg Oral Daily  . atorvastatin  40 mg Oral Daily  . carvedilol  25 mg Oral BID  . donepezil  10 mg Oral QHS  . heparin  5,000 Units Subcutaneous Q8H  . insulin aspart  0-9 Units Subcutaneous TID AC & HS  . levothyroxine  50 mcg Oral Daily  . phosphorus  250 mg Oral BID  . polyethylene glycol  17 g Oral Daily  . sodium bicarbonate  650 mg Oral TID  . sodium zirconium cyclosilicate  10 g Oral Daily   acetaminophen **OR** acetaminophen, ondansetron **OR** ondansetron (ZOFRAN) IV, traZODone  Assessment/ Plan:  Ms. ADALYND DONAHOE is a 74 y.o. African American female with hypothyroidism, hypertension, hyperlipidemia, diabetes mellitus type II insulin dependent, who was admitted to [facility] on 11/15/2019 for Dehydration [Y54.8] Metabolic acidosis [Y28.2] AKI (acute kidney injury) ([place]) [N17.9]  #Renal AKI secondary to ATN  Patient has h/o CKD Stage IV, developed AKI secondary to hypovolemia and decreased PO intake. Creatinine improving Encourage PO intake  # Hypertension Blood pressure readings within normal range with carvedilol and amlodipine.   # Anemia of Chronic Disease Hgb today is 1.3, at goal, will continue monitoring  #Hyperkalemia  K+ within normal range,  4.8 today - discontinue lokelma  #Hyponatremia  - improved.    LOS: 4 Jeannemarie Sawaya 8/31/20212:44 PM

## 2019-12-07 NOTE — Evaluation (Signed)
Physical Therapy Evaluation Patient Details Name: [name] [name] MRN: [id number] DOB: [birth year] Today's Date: 12/07/2019   History of Present Illness  74 y.o. female with a known history of CHF, type 2 diabetes mellitus, hypertension, dyslipidemia and hypothyroidism, who presented to the emergency room with abnormal labs.  She has been having nausea and vomiting over the last several days with subsequent generalized weakness and lightheadedness.   Clinical Impression  Pt initially seemed somewhat willing to participate but ultimately she was very limited and showed little motivation to actual do a task when the time actually arose.  She showed reasonable effort with getting to EOB (heavy rail use, mod assist from PT), she c/o vague lightheadeness the entire time in sitting (BP was 110s/70s) with no vertigo or nystagmus.  Despite heavy cuing and encouragement (from PT and son) she did not show any real effort in standing attempt and kept bargaining to not do any walking but maybe just transfer to the recliner, ultimately she did not do (or even attempt) that and then layed back down on bed with legs still hanging off.  Pt then had BM and PT assisted with getting her back up into the bed and contacted nursing for assistance with clean up.  Pt's son present t/o the session and knows he has a lot on his hands for a return home.      Follow Up Recommendations SNF;Supervision/Assistance - 24 hour    Equipment Recommendations  3in1 (PT);Rolling walker with 5" wheels    Recommendations for Other Services       Precautions / Restrictions Precautions Precautions: Fall Restrictions Weight Bearing Restrictions: No      Mobility  Bed Mobility Overal bed mobility: Needs Assistance Bed Mobility: Supine to Sit;Sit to Supine;Rolling Rolling: Min assist   Supine to sit: Mod assist Sit to supine: Mod assist   General bed mobility comments: Pt gives some effort initially with getting up to sitting  but ultimately stated she couldn't asking for more and more assist.  Definite assist in getting LEs back up into bed as she simply flopped and layed back down on her side once she decided she was not going to actually try to stand  Transfers Overall transfer level: Needs assistance Equipment used: Rolling walker (2 wheeled) Transfers: Sit to/from Stand Sit to Stand: Total assist         General transfer comment: ostensibly she tried to stand on one attempt, but truth-be-told she tried little if at all and despite cuing and encouragement from PT and son (and elevating bed) she refused to "try" again  Ambulation/Gait             General Gait Details: deferred, unable to stand, pt reports she did little to no walking at rehab  Stairs            Wheelchair Mobility    Modified Rankin (Stroke Patients Only)       Balance Overall balance assessment: Needs assistance Sitting-balance support: Feet supported;Bilateral upper extremity supported Sitting balance-Leahy Scale: Fair Sitting balance - Comments: Pt feeling "lightheaded" the entire time and though not unsteady feeling poorly       Standing balance comment: deferred- pt unable to perform bed mobility / independently maintain sitting balance                             Pertinent Vitals/Pain Pain Assessment: No/denies pain    Home Living Family/patient expects to  be discharged to:: Unsure Living Arrangements: Children (has been at rehab in recent weeks/months) Available Help at Discharge: Family;Available PRN/intermittently (son working part time, looking for full time job) Type of Home: House Home Access: Stairs to enter Entrance Stairs-Rails: Left Entrance Stairs-Number of Steps: [place]: One level Home Equipment: [place] - single point;Shower seat;Wheelchair - manual      Prior Function Level of Independence: Needs assistance   Gait / Transfers Assistance Needed: A few months ago she  ostensibly walked in the home some, recently has been essentially non-ambulatory even at rehab  ADL's / Homemaking Assistance Needed: Recently at rehab, reports she has needed assist for most everything        Hand Dominance   Dominant Hand: Left    Extremity/Trunk Assessment   Upper Extremity Assessment Upper Extremity Assessment: Generalized weakness    Lower Extremity Assessment Lower Extremity Assessment: Generalized weakness (lacks b/l knee flexion >90)       Communication   Communication: No difficulties  Cognition Arousal/Alertness: Awake/alert Behavior During Therapy: Anxious Overall Cognitive Status: Difficult to assess                                 General Comments: A&O. largely non-motivated and disinterested in moving, son present and stated she has been this way for ~78mo      General Comments      Exercises Other Exercises Other Exercises: education provided on the physiological benefits of exercise   Assessment/Plan    PT Assessment Patient needs continued PT services  PT Problem List Decreased strength;Decreased activity tolerance;Decreased balance;Decreased knowledge of use of DME;Decreased mobility;Decreased safety awareness       PT Treatment Interventions DME instruction;Gait training;Stair training;Functional mobility training;Therapeutic activities;Patient/family education;Balance training;Therapeutic exercise    PT Goals (Current goals can be found in the Care Plan section)  Acute Rehab PT Goals Patient Stated Goal: walk PT Goal Formulation: Patient unable to participate in goal setting Time For Goal Achievement: 12/21/19 Potential to Achieve Goals: Fair    Frequency Min 2X/week   Barriers to discharge Inaccessible home environment;Decreased caregiver support      Co-evaluation               AM-PAC PT "6 Clicks" Mobility  Outcome Measure Help needed turning from your back to your side while in a flat bed  without using bedrails?: Total Help needed moving from lying on your back to sitting on the side of a flat bed without using bedrails?: Total Help needed moving to and from a bed to a chair (including a wheelchair)?: Total Help needed standing up from a chair using your arms (e.g., wheelchair or bedside chair)?: Total Help needed to walk in hospital room?: Total Help needed climbing 3-5 steps with a railing? : Total 6 Click Score: 6    End of Session Equipment Utilized During Treatment: Gait belt Activity Tolerance: Patient limited by fatigue Patient left: with call bell/phone within reach;with bed alarm set;with family/visitor present Nurse Communication: Mobility status PT Visit Diagnosis: Unsteadiness on feet (R26.81);Muscle weakness (generalized) (M62.81);History of falling (Z91.81);Difficulty in walking, not elsewhere classified (R26.2)    Time: 8280-0349 PT Time Calculation (min) (ACUTE ONLY): 43 min   Charges:   PT Evaluation $PT Eval Low Complexity: 1 Low PT Treatments $Therapeutic Activity: 8-22 mins        Kreg Shropshire, DPT 12/07/2019, 2:39 PM

## 2019-12-07 NOTE — TOC Transition Note (Signed)
Transition of Care [facility]) - CM/SW Discharge Note   Patient Details  Name: [name] [name] MRN: [id number] Date of Birth: 12/29/1945  [facility]) CM/SW Contact:  Candie Chroman, LCSW Phone Number: 12/07/2019, 3:27 PM   Clinical Narrative: Patient has orders to discharge home today. [facility] is aware. DME has been delivered to the room. EMS transport has been arranged. Patient is fifth on the list. No further concerns. CSW signing off.    Final next level of care: [place] Barriers to Discharge: Barriers Resolved   Patient Goals and CMS Choice Patient states their goals for this hospitalization and ongoing recovery are:: [name] does not want to return to [place]. CMS Medicare.gov Compare Post Acute Care list provided to:: Patient Represenative (must comment) [place], [place] (Son) [phone]) Choice offered to / list presented to : Patient, Adult Children  Discharge Placement                Patient to be transferred to facility by: EMS Name of family member notified: Ar[place]a Jansky Patient and family notified of of transfer: 12/07/19  Discharge Plan and Services In-house Referral: Clinical Social Work   Post Acute Care Choice: Home Health          DME Arranged: Gilford Rile rolling, 3-N-1 DME Agency: [facility] Date DME Agency Contacted: 12/07/19   Representative spoke with at DME Agency: Andree Coss HH Arranged: RN, PT, OT, Nurse's Aide, Social Work [employer] Agency: [place] ([place]) Date [place]: 12/07/19 Time [place]: 3 Representative spoke with at [place]: [place] ([place]) Interventions     Readmission Risk Interventions Readmission Risk Prevention Plan 11/19/2019  Transportation Screening Complete  PCP or Specialist Appt within 3-5 Days Complete  HRI or [place] [facility] Complete  Social Work Consult for [place] Planning/Counseling Complete  Palliative Care  Screening Complete  Medication Review Press photographer) Complete  Some recent data might be hidden

## 2019-12-07 NOTE — Plan of Care (Signed)
Continuing with plan of care. 

## 2019-12-07 NOTE — Plan of Care (Signed)
Discharge teaching completed with patient and son [name] [name].  Patient is in stable condition.

## 2019-12-07 NOTE — Discharge Instructions (Signed)
Advised to follow-up with primary care physician in 1 week. Advised to follow-up with nephrology as a scheduled. Patient has been discharged on sodium bicarbonate tablets 3 times daily for few days.

## 2019-12-07 NOTE — TOC Progression Note (Addendum)
Transition of Care [facility]) - Progression Note    Patient Details  Name: [name] [name] MRN: [id number] Date of Birth: 1945/08/12  Transition of Care [facility]) CM/SW [place], LCSW Phone Number: 12/07/2019, 1:24 PM  Clinical Narrative: [facility] is unable to accept patient for home health. Called around to several other agencies. [facility] is able to accept her for PT, OT, RN, aide, SW. A nurse can come out to her home tomorrow. Patient and son are aware and agreeable. Patient has a wheelchair and cane at home. Sent secure chat to PT and OT to see if they recommended any other DME. Patient will need EMS home. CSW confirmed with son that address on facesheet is correct.    1:53 pm: PT recommending a walker and 3-in-1. Patient and son are agreeable. CSW called [facility] representative to order. Will set up EMS transport once they have been delivered.  Expected Discharge Plan: [place] Barriers to Discharge: Continued Medical Work up  Expected Discharge Plan and Services Expected Discharge Plan: [place] In-house Referral: Clinical Social Work   Post Acute Care Choice: [place] arrangements for the past 2 months: [place] Expected Discharge Date: 12/07/19                           [facility] [place] Agency: [facility] Date [place]: 12/05/19 Time [place]: 9833 Representative spoke with at [place]: Jana Half [phone]   Social Determinants of Health (SDOH) Interventions    Readmission Risk Interventions Readmission Risk Prevention Plan 11/19/2019  Transportation Screening Complete  PCP or Specialist Appt within 3-5 Days Complete  HRI or [place] Complete  Social Work Consult for [place] Planning/Counseling Complete  Palliative Care Screening Complete  Medication Review Press photographer) Complete  Some recent data might be hidden

## 2019-12-07 NOTE — Discharge Summary (Signed)
Physician Discharge Summary  [name] [name] BHA:[id number] DOB: [birth year] DOA: 12/03/2019  PCP: Marguerita Merles, MD  Admit date: 12/03/2019   Discharge date: 12/07/2019  Admitted From:  SNF.  Disposition:  Home with home services.  Recommendations for Outpatient Follow-up:  1. Follow up with PCP in 1-2 weeks. 2. Please obtain BMP/CBC in one week. 3. Advised to follow-up with primary care physician in 1 week. 4. Advised to follow-up with nephrology as a scheduled. 5. Patient has been discharged on sodium bicarbonate tablets 3 times daily for few days.  Home Health:Yes  Equipment/Devices:  Home PT/OT/ RN/ Home Aid  Discharge Condition: Stable CODE STATUS:Full code Diet recommendation: Heart Healthy   Brief Summary : JaniceBrownis a74 y.o.femalewith a known history of CHF, type 2 diabetes mellitus, hypertension, dyslipidemia and hypothyroidism, who presented to the emergency room with abnormal labs. She has been having nausea and vomiting over the last several days with subsequent generalized weakness and lightheadedness. At her skilled nursing facility she had blood work that revealed elevated BUN and creatinine.She admits to mild epigastric abdominal pain with nausea and vomiting. She has been constipated lately. She feels her abdomen is "filled up".  Labs revealed a potassium of 5.6 compared to 4.1 on 11/19/2019, sodium 132 with chloride 98 and a BUN of 75 up from 58 then, creatinine of 4.22 up from 2.45 then with magnesium of 3.1 albumin of 3.1 and total protein of 7.7. CBC showed anemia with hemoglobin of 11 hematocrit 33.5 close to previous levels. EKG showed sinus bradycardia with rate of 54.  Hospital course: This patient was admitted for acute on chronic kidney disease and hyperkalemia could be secondary to the dehydration.. Patient was given  IV fluids, serum creatinine is improving, she has received Lokelma for hyperkalemia,  Nephrology consulted recommended to  continue current management.  Patient's renal function has significantly improved,  serum creatinine 1.92 on discharge.  Patient was cleared from nephrology to be discharged and follow-up outpatient.  Patient was sent from nursing home.  She has completed Medicare days, she does not have any further details for rehab.  Home health aide, nursing PT and OT has been arranged.  Patient is being discharged home with home services.  Patient has been prescribed sodium bicarbonate for few days given low bicarbonate.   She was managed for below problems.  Discharge Diagnoses:  Active Problems:   AKI (acute kidney injury) ([name] [name])  # Acute kidney injurylikely prerenalsuperimposed on stage IV chronic kidney disease>>>> improving Patient has been having nausea and vomiting with constipation and subsequent dehydration. - admitted to a medically monitored bed. -Continue hydration with IV normal saline. - avoid nephrotoxins. -Daily BMPs will be followed. -Renal ultrasoundwas ordered and revealed mild atrophic kidneys that represent medical renal disease with no hydronephrosis or shadowing stone. -Nephrology consulted, recommended to continue current management.  2. Hyperkalemia >>> resolved -This is clearly secondary to her acute kidney injury. -She was managed with p.o. Veltassa and will follow her potassium.  3. Type 2 diabetes mellitus. - supplemental coverage with NovoLog and will continue her basal coverage.    Her insulin has been on hold because she has hypoglycemic episodes. -   Her Insulin resumed at discharge.   4. Essential hypertension. - Continue Coreg and amlodipine. -  Reduce the dose of amlodipine from 10-5 given bradycardia.  5. Dyslipidemia. - continue statin therapy.  6. Dementia. - continue Aricept.  7. Hypothyroidism. -We will check TSH and continue Synthroid.  8. DVT prophylaxis. -Subcutaneous  Lovenox.  9. UTI:  UA shows trace leukocyte Estrace. She  denies any urinary symptoms. She completed ceftriaxone for 3 days in the hospital.  Discharge Instructions  Discharge Instructions    Call MD for:  difficulty breathing, headache or visual disturbances   Complete by: As directed    Call MD for:  persistant dizziness or light-headedness   Complete by: As directed    Call MD for:  persistant nausea and vomiting   Complete by: As directed    Call MD for:  redness, tenderness, or signs of infection (pain, swelling, redness, odor or green/yellow discharge around incision site)   Complete by: As directed    Diet - low sodium heart healthy   Complete by: As directed    Diet Carb Modified   Complete by: As directed    Discharge instructions   Complete by: As directed    Advised to follow-up with primary care physician in 1 week. Advised to follow-up with nephrology as a scheduled. Patient has been discharged on sodium bicarbonate tablets 3 times daily for few days.   Discharge wound care:   Complete by: As directed    [place] has been arranged.   Increase activity slowly   Complete by: As directed      Allergies as of 12/07/2019   No Known Allergies     Medication List    STOP taking these medications   moxifloxacin 400 MG tablet Commonly known as: AVELOX     TAKE these medications   amLODipine 10 MG tablet Commonly known as: NORVASC Take 1 tablet by mouth daily.   ASPIRIN 81 PO Take by mouth daily.   atorvastatin 40 MG tablet Commonly known as: LIPITOR Take 1 tablet by mouth at bedtime.   carvedilol 25 MG tablet Commonly known as: COREG Take 1 tablet by mouth 2 (two) times daily.   donepezil 10 MG tablet Commonly known as: ARICEPT Take 10 mg by mouth at bedtime.   insulin aspart protamine- aspart (70-30) 100 UNIT/ML injection Commonly known as: NOVOLOG MIX 70/30 Inject 15 Units into the skin 2 (two) times daily with a meal.   LACTOBACILLUS RHAMNOSUS (GG) PO Take 1 tablet by mouth daily.    lactulose 10 GM/15ML solution Commonly known as: CHRONULAC Take 10 g by mouth daily.   omeprazole 20 MG capsule Commonly known as: PRILOSEC Take 20 mg by mouth daily.   polyethylene glycol 17 g packet Commonly known as: MIRALAX / GLYCOLAX Take 17 g by mouth daily.   promethazine 25 MG/ML injection Commonly known as: PHENERGAN Inject 12.5 mg into the vein every 6 (six) hours as needed for nausea or vomiting.   sodium bicarbonate 650 MG tablet Take 1 tablet (650 mg total) by mouth 3 (three) times daily.   Synthroid 50 MCG tablet Generic drug: levothyroxine Take 1 tablet by mouth daily.   Tyler Aas FlexTouch 100 UNIT/ML FlexTouch Pen Generic drug: insulin degludec Inject 80 Units into the skin at bedtime.            Discharge Care Instructions  (From admission, onward)         Start     Ordered   12/07/19 0000  Discharge wound care:       Comments: Home health nursing has been arranged.   12/07/19 1257          Follow-up Information    Marguerita Merles, MD Follow up in 1 week(s).   Specialty: Family Medicine Contact information: [phone]  [place] [address] [phone]              No Known Allergies  Consultations:  Nephrology    Procedures/Studies: DG Abd 1 View  Result Date: 12/04/2019 CLINICAL DATA:  74 year old female with abdominal pain. EXAM: ABDOMEN - 1 VIEW COMPARISON:  Abdominal radiograph dated 11/16/2019. FINDINGS: No bowel dilatation or evidence of obstruction. No free air. Renal vascular calcifications. Degenerative changes of the spine. The soft tissues are grossly unremarkable IMPRESSION: Negative. Electronically Signed   By: Anner Crete M.D.   On: 12/04/2019 00:52   DG Abd 1 View  Result Date: 11/16/2019 CLINICAL DATA:  Vomiting EXAM: ABDOMEN - 1 VIEW COMPARISON:  CT 10/10/2019 FINDINGS: Nonobstructive bowel gas pattern. No organomegaly or free air. Renal vascular calcifications noted in the kidneys bilaterally as  seen on prior CT. Degenerative changes in the lumbar spine and hips. No acute bony abnormality. IMPRESSION: No acute findings. Electronically Signed   By: Rolm Baptise M.D.   On: 11/16/2019 18:17   CT Head Wo Contrast  Result Date: 11/15/2019 CLINICAL DATA:  Altered mental status and lethargy EXAM: CT HEAD WITHOUT CONTRAST TECHNIQUE: Contiguous axial images were obtained from the base of the skull through the vertex without intravenous contrast. COMPARISON:  October 10, 2019 FINDINGS: Brain: Mild diffuse atrophy is stable. There is no intracranial mass, hemorrhage, extra-axial fluid collection, or midline shift. There is evidence of a prior infarct involving the head of the caudate nucleus on the right. There are adjacent prior appearing infarct involving portions of the anterior limbs of the right internal and external capsule. There is patchy small vessel disease throughout the centra semiovale bilaterally. No acute infarct is evident. Vascular: There is no hyperdense vessel. There is calcification in each distal vertebral artery and carotid siphon region. Skull: Bony calvarium appears intact. Sinuses/Orbits: There is mucosal thickening in the right maxillary antrum. There is mucosal thickening in several ethmoid air cells. Visualized orbits appear symmetric bilaterally. Other: Visualized mastoid air cells are clear. IMPRESSION: Stable atrophy with periventricular small vessel disease. 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 No acute infarct evident. No mass or hemorrhage. There are multiple foci of arterial vascular calcification. There is paranasal sinus disease at several sites. Electronically Signed   By: Lowella Grip III M.D.   On: 11/15/2019 10:25   US Renal  Result Date: 12/03/2019 CLINICAL DATA:  74 year old female with acute renal insufficiency. EXAM: RENAL / URINARY TRACT ULTRASOUND COMPLETE COMPARISON:  Renal ultrasound dated 11/16/2019. FINDINGS: Right Kidney: Renal  measurements: 8.7 x 4.8 x 4.6 cm = volume: 100 mL. Mild parenchyma atrophy and increased echogenicity. No hydronephrosis or shadowing stone. Left Kidney: Renal measurements: 8.8 x 5.1 x 4.2 cm = volume: 98 mL. Mildly atrophic and echogenic. No hydronephrosis or shadowing stone. Bladder: Appears normal for degree of bladder distention. Other: None. IMPRESSION: Mildly atrophic kidneys may represent medical renal disease. No hydronephrosis or shadowing stone. Electronically Signed   By: Anner Crete M.D.   On: 12/03/2019 22:26   US RENAL  Result Date: 11/16/2019 CLINICAL DATA:  Acute kidney injury. EXAM: RENAL / URINARY TRACT ULTRASOUND COMPLETE COMPARISON:  CT abdomen pelvis dated October 10, 2019. Renal ultrasound dated October 05, 2019. FINDINGS: Right Kidney: Renal measurements: 9.8 x 5.8 x 4.9 cm = volume: 145 mL. Increased echogenicity and cortical thinning. No mass or hydronephrosis visualized. Several calcifications measuring up to 1.1 cm. Left Kidney: Renal measurements: 9.3 x 4.9 x 4.4 cm =  volume: 103 mL. Increased echogenicity and cortical thinning. No mass or hydronephrosis visualized. Several calcifications measuring up to 0.7 cm. Bladder: Appears normal for degree of bladder distention. Other: None. IMPRESSION: 1. Unchanged increased echogenicity and cortical thinning of the bilateral kidneys, consistent with medical renal disease. 2. Unchanged bilateral renal calcifications, likely vascular based on recent CT. No hydronephrosis. Electronically Signed   By: Titus Dubin M.D.   On: 11/16/2019 11:14   DG Chest Portable 1 View  Result Date: 11/15/2019 CLINICAL DATA:  Fever with lethargy EXAM: PORTABLE CHEST 1 VIEW COMPARISON:  October 10, 2019 FINDINGS: There is mild atelectatic change in the right mid lung and right base regions. The lungs elsewhere are clear. Heart is mildly enlarged with pulmonary vascularity normal. No adenopathy. No bone lesions. IMPRESSION: Scattered areas of atelectatic change on  the right. Lungs otherwise clear. Heart mildly enlarged. Electronically Signed   By: Lowella Grip III M.D.   On: 11/15/2019 10:34       Subjective: Patient was seen and examined at bedside.  No overnight events.  Patient feels much better,  tolerated regular diet.  She is happy to be discharged home.  Discharge Exam: Vitals:   12/07/19 0529 12/07/19 1210  BP: 133/66 121/65  Pulse: (!) 56 (!) 57  Resp: 16 16  Temp: 98.1 F (36.7 C) 98.5 F (36.9 C)  SpO2: 95% 96%   Vitals:   12/06/19 1141 12/06/19 2001 12/07/19 0529 12/07/19 1210  BP: (!) 137/53 (!) 136/59 133/66 121/65  Pulse: (!) 55 (!) 58 (!) 56 (!) 57  Resp: 18 14 16 16   Temp: 98.1 F (36.7 C) 98.5 F (36.9 C) 98.1 F (36.7 C) 98.5 F (36.9 C)  TempSrc: Oral Oral Oral   SpO2: 99% 100% 95% 96%  Weight:      Height:        General: Pt is alert, awake, not in acute distress Cardiovascular: RRR, S1/S2 +, no rubs, no gallops Respiratory: CTA bilaterally, no wheezing, no rhonchi Abdominal: Soft, NT, ND, bowel sounds + Extremities: no edema, no cyanosis    The results of significant diagnostics from this hospitalization (including imaging, microbiology, ancillary and laboratory) are listed below for reference.     Microbiology: Recent Results (from the past 240 hour(s))  SARS Coronavirus 2 by RT PCR (hospital order, performed in [facility] hospital lab) Nasopharyngeal Nasopharyngeal Swab     Status: None   Collection Time: 12/03/19 11:35 PM   Specimen: Nasopharyngeal Swab  Result Value Ref Range Status   SARS Coronavirus 2 NEGATIVE NEGATIVE Final    Comment: (NOTE) SARS-CoV-2 target nucleic acids are NOT DETECTED.  The SARS-CoV-2 RNA is generally detectable in upper and lower respiratory specimens during the acute phase of infection. The lowest concentration of SARS-CoV-2 viral copies this assay can detect is 250 copies / mL. A negative result does not preclude SARS-CoV-2 infection and should not be used  as the sole basis for treatment or other patient management decisions.  A negative result may occur with improper specimen collection / handling, submission of specimen other than nasopharyngeal swab, presence of viral mutation(s) within the areas targeted by this assay, and inadequate number of viral copies (<250 copies / mL). A negative result must be combined with clinical observations, patient history, and epidemiological information.  Fact Sheet for Patients:   StrictlyIdeas.no  Fact Sheet for Healthcare Providers: BankingDealers.co.za  This test is not yet approved or  cleared by the [place] FDA and has been authorized  for detection and/or diagnosis of SARS-CoV-2 by FDA under an Emergency Use Authorization (EUA).  This EUA will remain in effect (meaning this test can be used) for the duration of the COVID-19 declaration under Section 564(b)(1) of the Act, 21 U.S.C. section 360bbb-3(b)(1), unless the authorization is terminated or revoked sooner.  Performed at [facility], [address] [place], [place] [address]   Urine Culture     Status: Abnormal   Collection Time: 12/04/19 11:59 AM   Specimen: Urine, Clean Catch  Result Value Ref Range Status   Specimen Description   Final    URINE, CLEAN CATCH Performed at [facility], [address], [place], [place] [address]    Special Requests   Final    NONE Performed at [facility], [address] [place], [place] [address]    Culture >=100,000 COLONIES/mL ENTEROBACTER AEROGENES (A)  Final   Report Status 12/06/2019 FINAL  Final   Organism ID, Bacteria ENTEROBACTER AEROGENES (A)  Final      Susceptibility   Enterobacter aerogenes - MIC*    CEFAZOLIN >=64 RESISTANT Resistant     CEFTRIAXONE <=0.25 SENSITIVE Sensitive     CIPROFLOXACIN <=0.25 SENSITIVE Sensitive     GENTAMICIN <=1 SENSITIVE Sensitive     IMIPENEM 2 SENSITIVE Sensitive      NITROFURANTOIN 64 INTERMEDIATE Intermediate     TRIMETH/SULFA <=20 SENSITIVE Sensitive     PIP/TAZO <=4 SENSITIVE Sensitive     * >=100,000 COLONIES/mL ENTEROBACTER AEROGENES  MRSA PCR Screening     Status: None   Collection Time: 12/04/19 10:31 PM   Specimen: Nasopharyngeal  Result Value Ref Range Status   MRSA by PCR NEGATIVE NEGATIVE Final    Comment:        The GeneXpert MRSA Assay (FDA approved for NASAL specimens only), is one component of a comprehensive MRSA colonization surveillance program. It is not intended to diagnose MRSA infection nor to guide or monitor treatment for MRSA infections. Performed at [facility], [address] [place], [place] [address]      Labs: BNP (last 3 results) Recent Labs    10/10/19 1211  BNP 683.4*   Basic Metabolic Panel: Recent Labs  Lab 12/03/19 2053 12/03/19 2053 12/04/19 0806 12/04/19 1400 12/05/19 0609 12/06/19 0732 12/07/19 0645  NA 132*  --   --  130* 131* 135 135  K 5.6*   < > 6.4* 5.9* 5.1 4.8 4.4  CL 98  --   --  99 102 111 113*  CO2 26  --   --  21* 19* 20* 16*  GLUCOSE 109*  --   --  75 54* 109* 126*  BUN 75*  --   --  62* 53* 41* 33*  CREATININE 4.22*  --   --  3.41* 2.94* 2.31* 1.92*  CALCIUM 8.2*  --   --  8.1* 7.8* 7.9* 8.2*  MG 3.1*  --   --   --   --  2.5* 2.5*  PHOS  --   --   --   --   --  2.4* 2.3*   < > = values in this interval not displayed.   Liver Function Tests: Recent Labs  Lab 12/03/19 2053  AST 34  ALT 15  ALKPHOS 85  BILITOT 0.8  PROT 7.7  ALBUMIN 3.1*   No results for input(s): LIPASE, AMYLASE in the last 168 hours. No results for input(s): AMMONIA in the last 168 hours. CBC: Recent Labs  Lab 12/03/19 2053 12/05/19 0609 12/06/19 0732 12/07/19 0645  WBC 7.3 6.8 6.6  --   NEUTROABS 3.8  --   --   --   HGB 11.0* 9.3* 9.8* 10.3*  HCT 33.5* 30.3* 29.8* 31.0*  MCV 91.8 97.7 93.7  --   PLT 203 191 169  --    Cardiac Enzymes: No results for input(s):  CKTOTAL, CKMB, CKMBINDEX, TROPONINI in the last 168 hours. BNP: Invalid input(s): POCBNP CBG: Recent Labs  Lab 12/06/19 1138 12/06/19 1643 12/06/19 2106 12/07/19 0741 12/07/19 1207  GLUCAP 118* 114* 109* 107* 130*   D-Dimer No results for input(s): DDIMER in the last 72 hours. Hgb A1c No results for input(s): HGBA1C in the last 72 hours. Lipid Profile No results for input(s): CHOL, HDL, LDLCALC, TRIG, CHOLHDL, LDLDIRECT in the last 72 hours. Thyroid function studies No results for input(s): TSH, T4TOTAL, T3FREE, THYROIDAB in the last 72 hours.  Invalid input(s): FREET3 Anemia work up No results for input(s): VITAMINB12, FOLATE, FERRITIN, TIBC, IRON, RETICCTPCT in the last 72 hours. Urinalysis    Component Value Date/Time   COLORURINE YELLOW (A) 12/04/2019 1159   APPEARANCEUR HAZY (A) 12/04/2019 1159   APPEARANCEUR Clear 04/21/2013 0715   LABSPEC 1.013 12/04/2019 1159   LABSPEC 1.015 04/21/2013 0715   PHURINE 7.0 12/04/2019 1159   GLUCOSEU NEGATIVE 12/04/2019 1159   GLUCOSEU 50 mg/dL 04/21/2013 0715   HGBUR SMALL (A) 12/04/2019 1159   BILIRUBINUR NEGATIVE 12/04/2019 1159   BILIRUBINUR Negative 04/21/2013 0715   KETONESUR NEGATIVE 12/04/2019 1159   PROTEINUR NEGATIVE 12/04/2019 1159   NITRITE NEGATIVE 12/04/2019 1159   LEUKOCYTESUR TRACE (A) 12/04/2019 1159   LEUKOCYTESUR Negative 04/21/2013 0715   Sepsis Labs Invalid input(s): PROCALCITONIN,  WBC,  LACTICIDVEN Microbiology Recent Results (from the past 240 hour(s))  SARS Coronavirus 2 by RT PCR (hospital order, performed in [facility] hospital lab) Nasopharyngeal Nasopharyngeal Swab     Status: None   Collection Time: 12/03/19 11:35 PM   Specimen: Nasopharyngeal Swab  Result Value Ref Range Status   SARS Coronavirus 2 NEGATIVE NEGATIVE Final    Comment: (NOTE) SARS-CoV-2 target nucleic acids are NOT DETECTED.  The SARS-CoV-2 RNA is generally detectable in upper and lower respiratory specimens during the acute  phase of infection. The lowest concentration of SARS-CoV-2 viral copies this assay can detect is 250 copies / mL. A negative result does not preclude SARS-CoV-2 infection and should not be used as the sole basis for treatment or other patient management decisions.  A negative result may occur with improper specimen collection / handling, submission of specimen other than nasopharyngeal swab, presence of viral mutation(s) within the areas targeted by this assay, and inadequate number of viral copies (<250 copies / mL). A negative result must be combined with clinical observations, patient history, and epidemiological information.  Fact Sheet for Patients:   StrictlyIdeas.no  Fact Sheet for Healthcare Providers: BankingDealers.co.za  This test is not yet approved or  cleared by the [place] FDA and has been authorized for detection and/or diagnosis of SARS-CoV-2 by FDA under an Emergency Use Authorization (EUA).  This EUA will remain in effect (meaning this test can be used) for the duration of the COVID-19 declaration under Section 564(b)(1) of the Act, 21 U.S.C. section 360bbb-3(b)(1), unless the authorization is terminated or revoked sooner.  Performed at [facility], [address], [place], [place] [address]   Urine Culture     Status: Abnormal   Collection Time: 12/04/19 11:59 AM  Specimen: Urine, Clean Catch  Result Value Ref Range Status   Specimen Description   Final    URINE, CLEAN CATCH Performed at [facility], [address] [place], [place] [address]    Special Requests   Final    NONE Performed at [facility], [address] [place], [place] [address]    Culture >=100,000 COLONIES/mL ENTEROBACTER AEROGENES (A)  Final   Report Status 12/06/2019 FINAL  Final   Organism ID, Bacteria ENTEROBACTER AEROGENES (A)  Final      Susceptibility   Enterobacter aerogenes - MIC*     CEFAZOLIN >=64 RESISTANT Resistant     CEFTRIAXONE <=0.25 SENSITIVE Sensitive     CIPROFLOXACIN <=0.25 SENSITIVE Sensitive     GENTAMICIN <=1 SENSITIVE Sensitive     IMIPENEM 2 SENSITIVE Sensitive     NITROFURANTOIN 64 INTERMEDIATE Intermediate     TRIMETH/SULFA <=20 SENSITIVE Sensitive     PIP/TAZO <=4 SENSITIVE Sensitive     * >=100,000 COLONIES/mL ENTEROBACTER AEROGENES  MRSA PCR Screening     Status: None   Collection Time: 12/04/19 10:31 PM   Specimen: Nasopharyngeal  Result Value Ref Range Status   MRSA by PCR NEGATIVE NEGATIVE Final    Comment:        The GeneXpert MRSA Assay (FDA approved for NASAL specimens only), is one component of a comprehensive MRSA colonization surveillance program. It is not intended to diagnose MRSA infection nor to guide or monitor treatment for MRSA infections. Performed at [facility], [address], [place], [place] [address]      Time coordinating discharge: Over 30 minutes  SIGNED:   Shawna Clamp, MD  [facility] 12/07/2019, 12:59 PM Pager   If 7PM-7AM, please contact night-coverage [email or web address]

## 2020-04-02 ENCOUNTER — Encounter: Payer: Self-pay | Admitting: Emergency Medicine

## 2020-04-02 ENCOUNTER — Emergency Department
Admission: EM | Admit: 2020-04-02 | Discharge: 2020-04-02 | Disposition: A | Discharge status: Home / Self Care | Payer: Medicare Other | Attending: Emergency Medicine | Admitting: Emergency Medicine

## 2020-04-02 ENCOUNTER — Other Ambulatory Visit: Payer: Self-pay

## 2020-04-02 ENCOUNTER — Emergency Department: Payer: Medicare Other

## 2020-04-02 DIAGNOSIS — Z79899 Other long term (current) drug therapy: Secondary | ICD-10-CM | POA: Insufficient documentation

## 2020-04-02 DIAGNOSIS — I129 Hypertensive chronic kidney disease with stage 1 through stage 4 chronic kidney disease, or unspecified chronic kidney disease: Secondary | ICD-10-CM | POA: Diagnosis not present

## 2020-04-02 DIAGNOSIS — K5641 Fecal impaction: Secondary | ICD-10-CM | POA: Insufficient documentation

## 2020-04-02 DIAGNOSIS — E1122 Type 2 diabetes mellitus with diabetic chronic kidney disease: Secondary | ICD-10-CM | POA: Insufficient documentation

## 2020-04-02 DIAGNOSIS — E039 Hypothyroidism, unspecified: Secondary | ICD-10-CM | POA: Diagnosis not present

## 2020-04-02 DIAGNOSIS — Z7982 Long term (current) use of aspirin: Secondary | ICD-10-CM | POA: Diagnosis not present

## 2020-04-02 DIAGNOSIS — K6289 Other specified diseases of anus and rectum: Secondary | ICD-10-CM | POA: Diagnosis present

## 2020-04-02 DIAGNOSIS — N184 Chronic kidney disease, stage 4 (severe): Secondary | ICD-10-CM | POA: Diagnosis not present

## 2020-04-02 DIAGNOSIS — M25531 Pain in right wrist: Secondary | ICD-10-CM | POA: Diagnosis not present

## 2020-04-02 DIAGNOSIS — Z794 Long term (current) use of insulin: Secondary | ICD-10-CM | POA: Diagnosis not present

## 2020-04-02 LAB — COMPREHENSIVE METABOLIC PANEL WITH GFR
ALT: 9 U/L (ref 0–44)
AST: 25 U/L (ref 15–41)
Albumin: 3.7 g/dL (ref 3.5–5.0)
Alkaline Phosphatase: 70 U/L (ref 38–126)
Anion gap: 10 (ref 5–15)
BUN: 19 mg/dL (ref 8–23)
CO2: 23 mmol/L (ref 22–32)
Calcium: 9.4 mg/dL (ref 8.9–10.3)
Chloride: 104 mmol/L (ref 98–111)
Creatinine, Ser: 1.69 mg/dL — ABNORMAL HIGH (ref 0.44–1.00)
GFR, Estimated: 32 mL/min — ABNORMAL LOW
Glucose, Bld: 184 mg/dL — ABNORMAL HIGH (ref 70–99)
Potassium: 5.1 mmol/L (ref 3.5–5.1)
Sodium: 137 mmol/L (ref 135–145)
Total Bilirubin: 0.9 mg/dL (ref 0.3–1.2)
Total Protein: 8.8 g/dL — ABNORMAL HIGH (ref 6.5–8.1)

## 2020-04-02 LAB — CBC WITH DIFFERENTIAL/PLATELET
Abs Immature Granulocytes: 0.04 K/uL (ref 0.00–0.07)
Basophils Absolute: 0 K/uL (ref 0.0–0.1)
Basophils Relative: 0 %
Eosinophils Absolute: 0.1 K/uL (ref 0.0–0.5)
Eosinophils Relative: 1 %
HCT: 36.8 % (ref 36.0–46.0)
Hemoglobin: 11.4 g/dL — ABNORMAL LOW (ref 12.0–15.0)
Immature Granulocytes: 0 %
Lymphocytes Relative: 27 %
Lymphs Abs: 3.2 K/uL (ref 0.7–4.0)
MCH: 29.6 pg (ref 26.0–34.0)
MCHC: 31 g/dL (ref 30.0–36.0)
MCV: 95.6 fL (ref 80.0–100.0)
Monocytes Absolute: 0.8 K/uL (ref 0.1–1.0)
Monocytes Relative: 7 %
Neutro Abs: 7.7 K/uL (ref 1.7–7.7)
Neutrophils Relative %: 65 %
Platelets: 249 K/uL (ref 150–400)
RBC: 3.85 MIL/uL — ABNORMAL LOW (ref 3.87–5.11)
RDW: 14.6 % (ref 11.5–15.5)
WBC: 11.9 K/uL — ABNORMAL HIGH (ref 4.0–10.5)
nRBC: 0 % (ref 0.0–0.2)

## 2020-04-02 MED ORDER — POLYETHYLENE GLYCOL 3350 17 G PO PACK: 17.0000 g | PACK | Freq: Every day | ORAL | 0 refills | Status: DC

## 2020-04-02 MED ORDER — BISACODYL 10 MG RE SUPP: 10.0000 mg | RECTAL | 0 refills | Status: AC | PRN

## 2020-04-02 MED ORDER — LIDOCAINE HCL URETHRAL/MUCOSAL 2 % EX GEL
1.0000 "application " | Freq: Once | CUTANEOUS | Status: AC
  Administered 2020-04-02: 1
  Filled 2020-04-02: qty 10

## 2020-04-02 NOTE — ED Notes (Signed)
Patients brief changed and patient cleaned up.  

## 2020-04-02 NOTE — ED Triage Notes (Signed)
Pt to ED via [facility] from home for rectal pain x 3 days. Pt reports that pain is worse when she swallows. Pt is in NAD. VSS per EMS.

## 2020-04-02 NOTE — ED Provider Notes (Addendum)
[facility] Emergency Department Provider Note    Event Date/Time   First MD Initiated Contact with Patient 04/02/20 1115     (approximate)  I have reviewed the triage vital signs and the nursing notes.   HISTORY  Chief Complaint Rectal Pain    HPI [name] [name] is a 74 y.o. female the below listed past medical history presents to the ER for evaluation of 3 days of worsening rectal pain abdominal pain and describes the pain is worsening whenever she swallows.  Denies any constipation.  No melena or hematochezia.  Is never had pain like this before.  No dysuria.  States that she has had normal bowel movements but feels like she still has the urge to defecate.    Past Medical History:  Diagnosis Date  . Diabetes mellitus without complication ([place])   . Humerus fracture 04/20/2018   left  . Hyperlipidemia   . Hypertension   . Hypothyroidism   . Vertigo    several yrs ago  . Wears dentures    partial upper and lower (loose)   Family History  Problem Relation Age of Onset  . Breast cancer Neg Hx    Past Surgical History:  Procedure Laterality Date  . CATARACT EXTRACTION W/PHACO Left 12/01/2018   Procedure: CATARACT EXTRACTION PHACO AND INTRAOCULAR LENS PLACEMENT ([place]) LEFT DIABETIC;  Surgeon: Birder Robson, MD;  Location: [facility];  Service: Ophthalmology;  Laterality: Left;  Diabetic - insulin  . COLONOSCOPY WITH PROPOFOL N/A 07/07/2019   Procedure: COLONOSCOPY WITH PROPOFOL;  Surgeon: Robert Bellow, MD;  Location: [facility] ENDOSCOPY;  Service: Endoscopy;  Laterality: N/A;  . ESOPHAGOGASTRODUODENOSCOPY (EGD) WITH PROPOFOL N/A 07/07/2019   Procedure: ESOPHAGOGASTRODUODENOSCOPY (EGD) WITH PROPOFOL;  Surgeon: Robert Bellow, MD;  Location: [facility] ENDOSCOPY;  Service: Endoscopy;  Laterality: N/A;  . FOOT SURGERY Right    lesion excision   Patient Active Problem List   Diagnosis Date Noted  . AKI (acute kidney injury) ([place]) 11/15/2019   . Physical deconditioning 11/15/2019  . Aspiration pneumonia ([place]) 10/10/2019  . Hyperkalemia   . Rib pain on left side   . Leukocytosis 10/04/2019  . Fall at home, initial encounter 10/04/2019  . Cellulitis of right leg 10/04/2019  . CKD stage 4 due to type 2 diabetes mellitus ([place]) 10/04/2019  . Acute kidney injury superimposed on CKD ([place])   . RUQ pain   . Hyperlipidemia   . Right leg pain 10/03/2019  . Diabetic ulcer of right foot associated with type 2 diabetes mellitus, with fat layer exposed ([place]) 10/03/2019  . Cellulitis of right foot 10/03/2019  . HTN (hypertension) 10/03/2019  . Type 2 diabetes mellitus with stage 3 chronic kidney disease ([place]) 10/03/2019  . Hypothyroidism (acquired) 10/03/2019  . UTI (urinary tract infection) 10/03/2019  . Sepsis ([place]) 04/25/2018      Prior to Admission medications   Medication Sig Start Date End Date Taking? Authorizing Provider  amLODipine (NORVASC) 10 MG tablet Take 1 tablet by mouth daily. 05/02/16   [provider]  ASPIRIN 81 PO Take by mouth daily.    [provider]  atorvastatin (LIPITOR) 40 MG tablet Take 1 tablet by mouth at bedtime.  05/02/16   [provider]  bisacodyl (DULCOLAX) 10 MG suppository Place 1 suppository (10 mg total) rectally as needed for moderate constipation. 04/02/20   Merlyn Lot, MD  carvedilol (COREG) 25 MG tablet Take 1 tablet by mouth 2 (two) times daily. 05/02/16  [provider]  donepezil (ARICEPT) 10 MG tablet Take 10 mg by mouth at bedtime.  04/21/18   [provider]  insulin aspart protamine- aspart (NOVOLOG MIX 70/30) (70-30) 100 UNIT/ML injection Inject 15 Units into the skin 2 (two) times daily with a meal.    [provider]  LACTOBACILLUS RHAMNOSUS, GG, PO Take 1 tablet by mouth daily.    [provider]  lactulose (CHRONULAC) 10 GM/15ML solution Take 10 g by mouth daily.    [provider]  omeprazole (PRILOSEC)  20 MG capsule Take 20 mg by mouth daily.    [provider]  polyethylene glycol (MIRALAX / GLYCOLAX) 17 g packet Take 17 g by mouth daily. Mix one tablespoon with 8oz of your favorite juice or water every day until you are having soft formed stools. Then start taking once daily if you didn't have a stool the day before. 04/02/20   Merlyn Lot, MD  polyethylene glycol (MIRALAX / GLYCOLAX) 17 g packet Take 17 g by mouth daily. 04/02/20   Merlyn Lot, MD  promethazine (PHENERGAN) 25 MG/ML injection Inject 12.5 mg into the vein every 6 (six) hours as needed for nausea or vomiting.    [provider]  sodium bicarbonate 650 MG tablet Take 1 tablet (650 mg total) by mouth 3 (three) times daily. 12/07/19   Shawna Clamp, MD  SYNTHROID 50 MCG tablet Take 1 tablet by mouth daily. 04/21/18   [provider]  TRESIBA FLEXTOUCH 100 UNIT/ML SOPN FlexTouch Pen Inject 80 Units into the skin at bedtime.  04/21/18   [provider]    Allergies Patient has no known allergies.    Social History Social History   Tobacco Use  . Smoking status: Never Smoker  . Smokeless tobacco: Never Used  Vaping Use  . Vaping Use: Never used  Substance Use Topics  . Alcohol use: No  . Drug use: Never    Review of Systems Patient denies headaches, rhinorrhea, blurry vision, numbness, shortness of breath, chest pain, edema, cough, abdominal pain, nausea, vomiting, diarrhea, dysuria, fevers, rashes or hallucinations unless otherwise stated above in HPI. ____________________________________________   PHYSICAL EXAM:  VITAL SIGNS: Vitals:   04/02/20 1347 04/02/20 1400  BP: 134/67 (!) 142/88  Pulse: 74 72  Resp: 18   Temp:    SpO2: 98% 99%    Constitutional: Alert and oriented.  Eyes: Conjunctivae are normal.  Head: Atraumatic. Nose: No congestion/rhinnorhea. Mouth/Throat: Mucous membranes are moist.   Neck: No stridor. Painless ROM.  Cardiovascular: Normal rate,  regular rhythm. Grossly normal heart sounds.  Good peripheral circulation. Respiratory: Normal respiratory effort.  No retractions. Lungs CTAB. Gastrointestinal: Soft and nontender. No distention. No abdominal bruits. No CVA tenderness. Genitourinary: Large volume but soft formed stool in the rectal vault.  No bleeding no melena.  No masses appreciated. Musculoskeletal:  Trace edema of the right wrist,  Painless passive rom,  No effusion, no warmth,  nv intact distally. No lower extremity tenderness nor edema.  No joint effusions. Neurologic:  Normal speech and language. No gross focal neurologic deficits are appreciated. No facial droop Skin:  Skin is warm, dry and intact. No rash noted. Psychiatric: Mood and affect are normal. Speech and behavior are normal.  ____________________________________________   LABS (all labs ordered are listed, but only abnormal results are displayed)  Results for orders placed or performed during the hospital encounter of 04/02/20 (from the past 24 hour(s))  CBC with Differential  Status: Abnormal   Collection Time: 04/02/20 11:34 AM  Result Value Ref Range   WBC 11.9 (H) 4.0 - 10.5 K/uL   RBC 3.85 (L) 3.87 - 5.11 MIL/uL   Hemoglobin 11.4 (L) 12.0 - 15.0 g/dL   HCT 36.8 36.0 - 46.0 %   MCV 95.6 80.0 - 100.0 fL   MCH 29.6 26.0 - 34.0 pg   MCHC 31.0 30.0 - 36.0 g/dL   RDW 14.6 11.5 - 15.5 %   Platelets 249 150 - 400 K/uL   nRBC 0.0 0.0 - 0.2 %   Neutrophils Relative % 65 %   Neutro Abs 7.7 1.7 - 7.7 K/uL   Lymphocytes Relative 27 %   Lymphs Abs 3.2 0.7 - 4.0 K/uL   Monocytes Relative 7 %   Monocytes Absolute 0.8 0.1 - 1.0 K/uL   Eosinophils Relative 1 %   Eosinophils Absolute 0.1 0.0 - 0.5 K/uL   Basophils Relative 0 %   Basophils Absolute 0.0 0.0 - 0.1 K/uL   Immature Granulocytes 0 %   Abs Immature Granulocytes 0.04 0.00 - 0.07 K/uL  Comprehensive metabolic panel     Status: Abnormal   Collection Time: 04/02/20 11:34 AM  Result Value Ref  Range   Sodium 137 135 - 145 mmol/L   Potassium 5.1 3.5 - 5.1 mmol/L   Chloride 104 98 - 111 mmol/L   CO2 23 22 - 32 mmol/L   Glucose, Bld 184 (H) 70 - 99 mg/dL   BUN 19 8 - 23 mg/dL   Creatinine, Ser 1.69 (H) 0.44 - 1.00 mg/dL   Calcium 9.4 8.9 - 10.3 mg/dL   Total Protein 8.8 (H) 6.5 - 8.1 g/dL   Albumin 3.7 3.5 - 5.0 g/dL   AST 25 15 - 41 U/L   ALT 9 0 - 44 U/L   Alkaline Phosphatase 70 38 - 126 U/L   Total Bilirubin 0.9 0.3 - 1.2 mg/dL   GFR, Estimated 32 (L) >60 mL/min   Anion gap 10 5 - 15   ____________________________________________ ____________________________________________  RADIOLOGY  I personally reviewed all radiographic images ordered to evaluate for the above acute complaints and reviewed radiology reports and findings.  These findings were personally discussed with the patient.  Please see medical record for radiology report.  ____________________________________________   PROCEDURES  Procedure(s) performed:  Procedures  ------------------------------------------------------------------------------------------------------------------- Fecal Disimpaction Procedure Note:  Performed by me:  Patient placed in the lateral recumbent position with knees drawn towards chest. Nurse present for patient support. Large amount of [place] stool removed. No complications during procedure.   ------------------------------------------------------------------------------------------------------------------    Critical Care performed: no ____________________________________________   INITIAL IMPRESSION / ASSESSMENT AND PLAN / ED COURSE  Pertinent labs & imaging results that were available during my care of the patient were reviewed by me and considered in my medical decision making (see chart for details).   DDX: Impaction, constipation, SBO, mass, proctitis  [name] [name] is a 74 y.o. who presents to the ED with presentation as described above.  Patient  nontoxic-appearing.  Does have borderline white count.  Given age and risk factors CT imaging ordered which shows evidence of fecal impaction.  Disimpaction performed and enema provided with large-volume bowel movement.  She is also complaining of right wrist pain that is been ongoing for quite some time.  No measured fevers.  No evidence of fracture.  Not consistent with septic arthritis.  This point patient does appear appropriate for continued outpatient management.  Patient and son  agreeable to plan.     The patient was evaluated in Emergency Department today for the symptoms described in the history of present illness. He/she was evaluated in the context of the global COVID-19 pandemic, which necessitated consideration that the patient might be at risk for infection with the SARS-CoV-2 virus that causes COVID-19. Institutional protocols and algorithms that pertain to the evaluation of patients at risk for COVID-19 are in a state of rapid change based on information released by regulatory bodies including the CDC and federal and state organizations. These policies and algorithms were followed during the patient's care in the ED.  As part of my medical decision making, I reviewed the following data within the [place] notes reviewed and incorporated, Labs reviewed, notes from prior ED visits and [place] Controlled Substance Database   ____________________________________________   FINAL CLINICAL IMPRESSION(S) / ED DIAGNOSES  Final diagnoses:  Fecal impaction in rectum (HCC)  Wrist pain, acute, right      NEW MEDICATIONS STARTED DURING THIS VISIT:  New Prescriptions   BISACODYL (DULCOLAX) 10 MG SUPPOSITORY    Place 1 suppository (10 mg total) rectally as needed for moderate constipation.   POLYETHYLENE GLYCOL (MIRALAX / GLYCOLAX) 17 G PACKET    Take 17 g by mouth daily. Mix one tablespoon with 8oz of your favorite juice or water every day until you are having soft  formed stools. Then start taking once daily if you didn't have a stool the day before.     Note:  This document was prepared using Dragon voice recognition software and may include unintentional dictation errors.    Merlyn Lot, MD 04/02/20 1428    Merlyn Lot, MD 04/02/20 [phone]

## 2020-04-02 NOTE — ED Notes (Signed)
Patient cleaned and new brief applied.

## 2020-04-02 NOTE — ED Notes (Addendum)
Patient complaining of rectal pain x3 days. Patient also states she has had difficulties keeping food down during this time. Patient has difficulties walking and needs assistance.

## 2020-04-02 NOTE — ED Notes (Signed)
Pt given soap suds enema by Quentin Cornwall, MD and this nurse.

## 2020-04-02 NOTE — ED Notes (Signed)
Patient at CT scan.

## 2020-04-02 NOTE — ED Notes (Signed)
Pt states that she fell 3 months ago and since then has not been able to walk - She wears a pad and does not even get up to bathroom per her report - Pt lives at home with her son  Pt c/o rectal pain/spasms x3 days - Pt reports that she is unable to eat or drink because her rectum "knots up" - Pt last had BM yesterday

## 2020-05-15 ENCOUNTER — Ambulatory Visit: Payer: Medicare Other | Admitting: Physician Assistant

## 2020-05-17 ENCOUNTER — Inpatient Hospital Stay
Admission: EM | Admit: 2020-05-17 | Discharge: 2020-06-05 | DRG: 617 | Disposition: A | Discharge status: Home Health | Payer: Medicare Other | Attending: Internal Medicine | Admitting: Internal Medicine

## 2020-05-17 ENCOUNTER — Other Ambulatory Visit: Payer: Self-pay

## 2020-05-17 ENCOUNTER — Emergency Department: Payer: Medicare Other

## 2020-05-17 DIAGNOSIS — N179 Acute kidney failure, unspecified: Secondary | ICD-10-CM | POA: Diagnosis not present

## 2020-05-17 DIAGNOSIS — D631 Anemia in chronic kidney disease: Secondary | ICD-10-CM | POA: Diagnosis present

## 2020-05-17 DIAGNOSIS — E1169 Type 2 diabetes mellitus with other specified complication: Secondary | ICD-10-CM | POA: Diagnosis present

## 2020-05-17 DIAGNOSIS — E11610 Type 2 diabetes mellitus with diabetic neuropathic arthropathy: Secondary | ICD-10-CM | POA: Diagnosis present

## 2020-05-17 DIAGNOSIS — E1152 Type 2 diabetes mellitus with diabetic peripheral angiopathy with gangrene: Secondary | ICD-10-CM | POA: Diagnosis present

## 2020-05-17 DIAGNOSIS — I70261 Atherosclerosis of native arteries of extremities with gangrene, right leg: Secondary | ICD-10-CM | POA: Diagnosis not present

## 2020-05-17 DIAGNOSIS — N141 Nephropathy induced by other drugs, medicaments and biological substances: Secondary | ICD-10-CM | POA: Diagnosis present

## 2020-05-17 DIAGNOSIS — E039 Hypothyroidism, unspecified: Secondary | ICD-10-CM | POA: Diagnosis present

## 2020-05-17 DIAGNOSIS — L97419 Non-pressure chronic ulcer of right heel and midfoot with unspecified severity: Secondary | ICD-10-CM | POA: Diagnosis present

## 2020-05-17 DIAGNOSIS — E1129 Type 2 diabetes mellitus with other diabetic kidney complication: Secondary | ICD-10-CM | POA: Diagnosis present

## 2020-05-17 DIAGNOSIS — K219 Gastro-esophageal reflux disease without esophagitis: Secondary | ICD-10-CM | POA: Diagnosis present

## 2020-05-17 DIAGNOSIS — E11628 Type 2 diabetes mellitus with other skin complications: Principal | ICD-10-CM | POA: Diagnosis present

## 2020-05-17 DIAGNOSIS — E785 Hyperlipidemia, unspecified: Secondary | ICD-10-CM | POA: Diagnosis present

## 2020-05-17 DIAGNOSIS — I129 Hypertensive chronic kidney disease with stage 1 through stage 4 chronic kidney disease, or unspecified chronic kidney disease: Secondary | ICD-10-CM | POA: Diagnosis present

## 2020-05-17 DIAGNOSIS — L089 Local infection of the skin and subcutaneous tissue, unspecified: Secondary | ICD-10-CM | POA: Diagnosis present

## 2020-05-17 DIAGNOSIS — Z961 Presence of intraocular lens: Secondary | ICD-10-CM | POA: Diagnosis present

## 2020-05-17 DIAGNOSIS — Z20822 Contact with and (suspected) exposure to covid-19: Secondary | ICD-10-CM | POA: Diagnosis present

## 2020-05-17 DIAGNOSIS — M109 Gout, unspecified: Secondary | ICD-10-CM | POA: Diagnosis present

## 2020-05-17 DIAGNOSIS — Z79899 Other long term (current) drug therapy: Secondary | ICD-10-CM

## 2020-05-17 DIAGNOSIS — Z7982 Long term (current) use of aspirin: Secondary | ICD-10-CM

## 2020-05-17 DIAGNOSIS — N184 Chronic kidney disease, stage 4 (severe): Secondary | ICD-10-CM | POA: Diagnosis present

## 2020-05-17 DIAGNOSIS — Z7401 Bed confinement status: Secondary | ICD-10-CM

## 2020-05-17 DIAGNOSIS — L97929 Non-pressure chronic ulcer of unspecified part of left lower leg with unspecified severity: Secondary | ICD-10-CM | POA: Diagnosis present

## 2020-05-17 DIAGNOSIS — E1142 Type 2 diabetes mellitus with diabetic polyneuropathy: Secondary | ICD-10-CM | POA: Diagnosis present

## 2020-05-17 DIAGNOSIS — L03115 Cellulitis of right lower limb: Secondary | ICD-10-CM | POA: Diagnosis present

## 2020-05-17 DIAGNOSIS — T508X5A Adverse effect of diagnostic agents, initial encounter: Secondary | ICD-10-CM | POA: Diagnosis not present

## 2020-05-17 DIAGNOSIS — E1122 Type 2 diabetes mellitus with diabetic chronic kidney disease: Secondary | ICD-10-CM | POA: Diagnosis present

## 2020-05-17 DIAGNOSIS — E1151 Type 2 diabetes mellitus with diabetic peripheral angiopathy without gangrene: Secondary | ICD-10-CM | POA: Diagnosis present

## 2020-05-17 DIAGNOSIS — M869 Osteomyelitis, unspecified: Secondary | ICD-10-CM | POA: Diagnosis present

## 2020-05-17 DIAGNOSIS — Z9842 Cataract extraction status, left eye: Secondary | ICD-10-CM

## 2020-05-17 DIAGNOSIS — E11621 Type 2 diabetes mellitus with foot ulcer: Secondary | ICD-10-CM

## 2020-05-17 DIAGNOSIS — Z09 Encounter for follow-up examination after completed treatment for conditions other than malignant neoplasm: Secondary | ICD-10-CM

## 2020-05-17 DIAGNOSIS — I70234 Atherosclerosis of native arteries of right leg with ulceration of heel and midfoot: Secondary | ICD-10-CM | POA: Diagnosis not present

## 2020-05-17 DIAGNOSIS — L97519 Non-pressure chronic ulcer of other part of right foot with unspecified severity: Secondary | ICD-10-CM | POA: Diagnosis present

## 2020-05-17 DIAGNOSIS — I10 Essential (primary) hypertension: Secondary | ICD-10-CM | POA: Diagnosis present

## 2020-05-17 DIAGNOSIS — Z79890 Hormone replacement therapy (postmenopausal): Secondary | ICD-10-CM

## 2020-05-17 DIAGNOSIS — R52 Pain, unspecified: Secondary | ICD-10-CM

## 2020-05-17 DIAGNOSIS — Z794 Long term (current) use of insulin: Secondary | ICD-10-CM

## 2020-05-17 LAB — COMPREHENSIVE METABOLIC PANEL
ALT: 11 U/L (ref 0–44)
AST: 18 U/L (ref 15–41)
Albumin: 3.3 g/dL — ABNORMAL LOW (ref 3.5–5.0)
Alkaline Phosphatase: 77 U/L (ref 38–126)
Anion gap: 12 (ref 5–15)
BUN: 32 mg/dL — ABNORMAL HIGH (ref 8–23)
CO2: 22 mmol/L (ref 22–32)
Calcium: 8.9 mg/dL (ref 8.9–10.3)
Chloride: 105 mmol/L (ref 98–111)
Creatinine, Ser: 1.88 mg/dL — ABNORMAL HIGH (ref 0.44–1.00)
GFR, Estimated: 28 mL/min — ABNORMAL LOW (ref >60)
Glucose, Bld: 193 mg/dL — ABNORMAL HIGH (ref 70–99)
Potassium: 4.3 mmol/L (ref 3.5–5.1)
Sodium: 139 mmol/L (ref 135–145)
Total Bilirubin: 0.5 mg/dL (ref 0.3–1.2)
Total Protein: 8.1 g/dL (ref 6.5–8.1)

## 2020-05-17 LAB — CBC WITH DIFFERENTIAL/PLATELET
Abs Immature Granulocytes: 0.01 10*3/uL (ref 0.00–0.07)
Basophils Absolute: 0 10*3/uL (ref 0.0–0.1)
Basophils Relative: 1 %
Eosinophils Absolute: 0.1 10*3/uL (ref 0.0–0.5)
Eosinophils Relative: 1 %
HCT: 34.5 % — ABNORMAL LOW (ref 36.0–46.0)
Hemoglobin: 11 g/dL — ABNORMAL LOW (ref 12.0–15.0)
Immature Granulocytes: 0 %
Lymphocytes Relative: 35 %
Lymphs Abs: 2.3 10*3/uL (ref 0.7–4.0)
MCH: 29.8 pg (ref 26.0–34.0)
MCHC: 31.9 g/dL (ref 30.0–36.0)
MCV: 93.5 fL (ref 80.0–100.0)
Monocytes Absolute: 0.5 10*3/uL (ref 0.1–1.0)
Monocytes Relative: 7 %
Neutro Abs: 3.7 10*3/uL (ref 1.7–7.7)
Neutrophils Relative %: 56 %
Platelets: 237 10*3/uL (ref 150–400)
RBC: 3.69 MIL/uL — ABNORMAL LOW (ref 3.87–5.11)
RDW: 13.8 % (ref 11.5–15.5)
WBC: 6.5 10*3/uL (ref 4.0–10.5)
nRBC: 0 % (ref 0.0–0.2)

## 2020-05-17 LAB — GLUCOSE, CAPILLARY: Glucose-Capillary: 159 mg/dL — ABNORMAL HIGH (ref 70–99)

## 2020-05-17 LAB — LACTIC ACID, PLASMA: Lactic Acid, Venous: 1.5 mmol/L (ref 0.5–1.9)

## 2020-05-17 LAB — APTT: aPTT: 28 seconds (ref 24–36)

## 2020-05-17 LAB — SEDIMENTATION RATE: Sed Rate: 81 mm/hr — ABNORMAL HIGH (ref 0–30)

## 2020-05-17 LAB — C-REACTIVE PROTEIN: CRP: 2.2 mg/dL — ABNORMAL HIGH (ref <1.0)

## 2020-05-17 LAB — CBG MONITORING, ED: Glucose-Capillary: 120 mg/dL — ABNORMAL HIGH (ref 70–99)

## 2020-05-17 MED ORDER — CARVEDILOL 25 MG PO TABS
25.0000 mg | ORAL_TABLET | Freq: Two times a day (BID) | ORAL | Status: DC
  Administered 2020-05-17 – 2020-06-05 (×37): 25 mg via ORAL
  Filled 2020-05-17 (×37): qty 1

## 2020-05-17 MED ORDER — INSULIN ASPART 100 UNIT/ML ~~LOC~~ SOLN
0.0000 [IU] | Freq: Every day | SUBCUTANEOUS | Status: DC
  Administered 2020-05-25: 1 [IU] via SUBCUTANEOUS
  Administered 2020-05-30: 2 [IU] via SUBCUTANEOUS
  Filled 2020-05-17 (×2): qty 1

## 2020-05-17 MED ORDER — ASPIRIN 81 MG PO CHEW
81.0000 mg | CHEWABLE_TABLET | Freq: Every day | ORAL | Status: DC
  Administered 2020-05-18 – 2020-06-05 (×16): 81 mg via ORAL
  Filled 2020-05-17 (×16): qty 1

## 2020-05-17 MED ORDER — LEVOTHYROXINE SODIUM 50 MCG PO TABS
50.0000 ug | ORAL_TABLET | Freq: Every day | ORAL | Status: DC
  Administered 2020-05-19 – 2020-06-05 (×17): 50 ug via ORAL
  Filled 2020-05-17 (×17): qty 1

## 2020-05-17 MED ORDER — ONDANSETRON HCL 4 MG/2ML IJ SOLN: 4.0000 mg | Freq: Three times a day (TID) | INTRAMUSCULAR | Status: DC | PRN

## 2020-05-17 MED ORDER — INSULIN ASPART 100 UNIT/ML ~~LOC~~ SOLN
0.0000 [IU] | Freq: Three times a day (TID) | SUBCUTANEOUS | Status: DC
  Administered 2020-05-18: 2 [IU] via SUBCUTANEOUS
  Administered 2020-05-18 (×2): 1 [IU] via SUBCUTANEOUS
  Administered 2020-05-19: 2 [IU] via SUBCUTANEOUS
  Administered 2020-05-20 – 2020-05-27 (×8): 1 [IU] via SUBCUTANEOUS
  Administered 2020-05-30: 2 [IU] via SUBCUTANEOUS
  Administered 2020-06-01 – 2020-06-02 (×2): 1 [IU] via SUBCUTANEOUS
  Administered 2020-06-03: 2 [IU] via SUBCUTANEOUS
  Administered 2020-06-03 – 2020-06-04 (×3): 1 [IU] via SUBCUTANEOUS
  Filled 2020-05-17 (×19): qty 1

## 2020-05-17 MED ORDER — AMLODIPINE BESYLATE 10 MG PO TABS
10.0000 mg | ORAL_TABLET | Freq: Every day | ORAL | Status: DC
Start: 2020-05-17 — End: 2020-06-05
  Administered 2020-05-18 – 2020-06-05 (×17): 10 mg via ORAL
  Filled 2020-05-17 (×18): qty 1

## 2020-05-17 MED ORDER — HYDRALAZINE HCL 20 MG/ML IJ SOLN: 5.0000 mg | INTRAMUSCULAR | Status: DC | PRN

## 2020-05-17 MED ORDER — DONEPEZIL HCL 5 MG PO TABS
10.0000 mg | ORAL_TABLET | Freq: Every day | ORAL | Status: DC
  Administered 2020-05-17 – 2020-06-04 (×19): 10 mg via ORAL
  Filled 2020-05-17 (×19): qty 2

## 2020-05-17 MED ORDER — ACETAMINOPHEN 325 MG PO TABS
650.0000 mg | ORAL_TABLET | Freq: Four times a day (QID) | ORAL | Status: DC | PRN
  Administered 2020-05-19 – 2020-06-05 (×15): 650 mg via ORAL
  Filled 2020-05-17 (×16): qty 2

## 2020-05-17 MED ORDER — ATORVASTATIN CALCIUM 20 MG PO TABS
40.0000 mg | ORAL_TABLET | Freq: Every day | ORAL | Status: DC
  Administered 2020-05-18 – 2020-06-05 (×17): 40 mg via ORAL
  Filled 2020-05-17 (×18): qty 2

## 2020-05-17 MED ORDER — HEPARIN SODIUM (PORCINE) 5000 UNIT/ML IJ SOLN
5000.0000 [IU] | Freq: Three times a day (TID) | INTRAMUSCULAR | Status: DC
  Administered 2020-05-17 – 2020-06-05 (×49): 5000 [IU] via SUBCUTANEOUS
  Filled 2020-05-17 (×51): qty 1

## 2020-05-17 MED ORDER — INSULIN GLARGINE 100 UNIT/ML ~~LOC~~ SOLN
30.0000 [IU] | Freq: Every day | SUBCUTANEOUS | Status: DC
  Administered 2020-05-17 – 2020-05-20 (×3): 30 [IU] via SUBCUTANEOUS
  Filled 2020-05-17 (×5): qty 0.3

## 2020-05-17 MED ORDER — SODIUM CHLORIDE 0.9 % IV SOLN
1.0000 g | INTRAVENOUS | Status: DC
  Administered 2020-05-17: 1 g via INTRAVENOUS
  Filled 2020-05-17 (×2): qty 10

## 2020-05-17 MED ORDER — BISACODYL 10 MG RE SUPP
10.0000 mg | RECTAL | Status: DC | PRN
  Administered 2020-05-21: 10 mg via RECTAL
  Filled 2020-05-17 (×2): qty 1

## 2020-05-17 NOTE — H&P (Signed)
History and Physical    JOURI THREAT GEX:[id number] DOB: [birth year] DOA: 05/17/2020  Referring MD/NP/PA:   PCP: Marguerita Merles, MD   Patient coming from:  The patient is coming from home.  At baseline, pt is independent for most of ADL.        Chief Complaint:  foot ulcer with infection in right second toe.   HPI: [name] [name] is a 75 y.o. female with medical history significant of DM, HTN, HLD, CKD-IV, hypothyroidism, gout, who presents with  foot ulcer with infection in right second toe.   Pt states that she noted a ulcer in dorsal aspects of right secondary toe about 1 week ago, which has been progressively worsening.  She noted little pus drainage.  She denies pain, fever or chills.  She was seen by Dr. Cleda Mccreedy of podiatry in office today. EDP, Dr. Joni Fears spoke with Dr. Cleda Mccreedy by phone. Per Dr. Cleda Mccreedy, pt likely needs right second toe amputation, but perfusion of the right foot seems marginal, and he is worried about ability to heal after amputation. He recommends hospitalization for antibiotic coverage, vascular consult to prepare her for podiatry intervention. Patient also has a noninfected ulceration into subcutaneous fat on the right heel.  Patient does not have chest pain, cough, shortness breath.  Patient had nausea and vomited once earlier, which has resolved.  Currently patient does not have nausea vomiting, diarrhea or abdominal pain.  No symptoms of UTI.   ED Course: pt was found to have WBC 6.5, pending COVID-19 PCR, lactic acid 1.5, stable renal function, temperature normal, blood pressure 143/72, heart rate 57, RR 18, oxygen saturation 98% on room air.  X-ray of her right foot did not show evidence of osteomyelitis.  Patient is admitted to [facility] as inpatient.  Dr. Cleda Mccreedy of podiatry and Dr. Delana Meyer of [facility] are consulted.  Review of Systems:   General: no fevers, chills, no body weight gain, has fatigue HEENT: no blurry vision, hearing changes or sore throat Respiratory:  no dyspnea, coughing, wheezing CV: no chest pain, no palpitations GI: no nausea, vomiting, abdominal pain, diarrhea, constipation GU: no dysuria, burning on urination, increased urinary frequency, hematuria  Ext: has ulcer in the dorsal aspects of right secondary toe.  Has healing ulceration in right heel. Neuro: no unilateral weakness, numbness, or tingling, no vision change or hearing loss Skin: no rash, no skin tear. MSK: No muscle spasm, no deformity, no limitation of range of movement in spin Heme: No easy bruising.  Travel history: No recent long distant travel.  Allergy: No Known Allergies  Past Medical History:  Diagnosis Date  . Diabetes mellitus without complication ([place])   . Humerus fracture 04/20/2018   left  . Hyperlipidemia   . Hypertension   . Hypothyroidism   . Vertigo    several yrs ago  . Wears dentures    partial upper and lower (loose)    Past Surgical History:  Procedure Laterality Date  . CATARACT EXTRACTION W/PHACO Left 12/01/2018   Procedure: CATARACT EXTRACTION PHACO AND INTRAOCULAR LENS PLACEMENT ([place]) LEFT DIABETIC;  Surgeon: Birder Robson, MD;  Location: [facility];  Service: Ophthalmology;  Laterality: Left;  Diabetic - insulin  . COLONOSCOPY WITH PROPOFOL N/A 07/07/2019   Procedure: COLONOSCOPY WITH PROPOFOL;  Surgeon: Robert Bellow, MD;  Location: [facility] END[facility]COPY;  Service: Endoscopy;  Laterality: N/A;  . ESOPHAGOGASTRODUODENOSCOPY (EGD) WITH PROPOFOL N/A 07/07/2019   Procedure: ESOPHAGOGASTRODUODENOSCOPY (EGD) WITH PROPOFOL;  Surgeon: Robert Bellow, MD;  Location: [facility] ENDOSCOPY;  Service: Endoscopy;  Laterality: N/A;  . FOOT SURGERY Right    lesion excision    Social History:  reports that she has never smoked. She has never used smokeless tobacco. She reports that she does not drink alcohol and does not use drugs.  Family History:  Family History  Problem Relation Age of Onset  . Breast cancer Neg Hx      Prior to  Admission medications   Medication Sig Start Date End Date Taking? Authorizing Provider  amLODipine (NORVASC) 10 MG tablet Take 1 tablet by mouth daily. 05/02/16   [provider]  ASPIRIN 81 PO Take by mouth daily.    [provider]  atorvastatin (LIPITOR) 40 MG tablet Take 1 tablet by mouth at bedtime.  05/02/16   [provider]  bisacodyl (DULCOLAX) 10 MG suppository Place 1 suppository (10 mg total) rectally as needed for moderate constipation. 04/02/20   Merlyn Lot, MD  carvedilol (COREG) 25 MG tablet Take 1 tablet by mouth 2 (two) times daily. 05/02/16   [provider]  donepezil (ARICEPT) 10 MG tablet Take 10 mg by mouth at bedtime.  04/21/18   [provider]  insulin aspart protamine- aspart (NOVOLOG MIX 70/30) (70-30) 100 UNIT/ML injection Inject 15 Units into the skin 2 (two) times daily with a meal.    [provider]  LACTOBACILLUS RHAMNOSUS, GG, PO Take 1 tablet by mouth daily.    [provider]  lactulose (CHRONULAC) 10 GM/15ML solution Take 10 g by mouth daily.    [provider]  omeprazole (PRILOSEC) 20 MG capsule Take 20 mg by mouth daily.    [provider]  polyethylene glycol (MIRALAX / GLYCOLAX) 17 g packet Take 17 g by mouth daily. Mix one tablespoon with 8oz of your favorite juice or water every day until you are having soft formed stools. Then start taking once daily if you didn't have a stool the day before. 04/02/20   Merlyn Lot, MD  polyethylene glycol (MIRALAX / GLYCOLAX) 17 g packet Take 17 g by mouth daily. 04/02/20   Merlyn Lot, MD  promethazine (PHENERGAN) 25 MG/ML injection Inject 12.5 mg into the vein every 6 (six) hours as needed for nausea or vomiting.    [provider]  sodium bicarbonate 650 MG tablet Take 1 tablet (650 mg total) by mouth 3 (three) times daily. 12/07/19   Shawna Clamp, MD  SYNTHROID 50 MCG tablet Take 1 tablet by mouth daily. 04/21/18    [provider]  TRESIBA FLEXTOUCH 100 UNIT/ML SOPN FlexTouch Pen Inject 80 Units into the skin at bedtime.  04/21/18   [provider]    Physical Exam: Vitals:   05/17/20 1201 05/17/20 1404 05/17/20 1500 05/17/20 1530  BP:  126/83 138/71 (!) 143/72  Pulse:  70 (!) 59 63  Resp:  '15 11 16  ' Temp:      TempSrc:      SpO2:  96% 98% 98%  Weight: 90 kg     Height: '5\' 7"'  (1.702 m)      General: Not in acute distress HEENT:       Eyes: PERRL, EOMI, no scleral icterus.       ENT: No discharge from the ears and nose, no pharynx injection, no tonsillar enlargement.        Neck: No JVD, no bruit, no mass felt. Heme: No neck lymph node enlargement. Cardiac: S1/S2, RRR, No murmurs, No gallops or rubs. Respiratory:  No rales, wheezing, rhonchi or rubs. GI: Soft, nondistended, nontender, no rebound pain, no organomegaly, BS present. GU: No hematuria Ext: Has ulcer in the right 2nd toe with little pus drainage.      Musculoskeletal: No joint deformities, No joint redness or warmth, no limitation of ROM in spin. Skin: No rashes.  Neuro: Alert, oriented X3, cranial nerves II-XII grossly intact, moves all extremities normally.  Psych: Patient is not psychotic, no suicidal or hemocidal ideation.  Labs on Admission: I have personally reviewed following labs and imaging studies  CBC: Recent Labs  Lab 05/17/20 1204  WBC 6.5  NEUTROABS 3.7  HGB 11.0*  HCT 34.5*  MCV 93.5  PLT 256   Basic Metabolic Panel: Recent Labs  Lab 05/17/20 1204  NA 139  K 4.3  CL 105  CO2 22  GLUCOSE 193*  BUN 32*  CREATININE 1.88*  CALCIUM 8.9   GFR: Estimated Creatinine Clearance: 30.7 mL/min (A) (by C-G formula based on SCr of 1.88 mg/dL (H)). Liver Function Tests: Recent Labs  Lab 05/17/20 1204  AST 18  ALT 11  ALKPHOS 77  BILITOT 0.5  PROT 8.1  ALBUMIN 3.3*   No results for input(s): LIPASE, AMYLASE in the last 168 hours. No results for input(s): AMMONIA in the last  168 hours. Coagulation Profile: No results for input(s): INR, PROTIME in the last 168 hours. Cardiac Enzymes: No results for input(s): CKTOTAL, CKMB, CKMBINDEX, TROPONINI in the last 168 hours. BNP (last 3 results) No results for input(s): PROBNP in the last 8760 hours. HbA1C: No results for input(s): HGBA1C in the last 72 hours. CBG: No results for input(s): GLUCAP in the last 168 hours. Lipid Profile: No results for input(s): CHOL, HDL, LDLCALC, TRIG, CHOLHDL, LDLDIRECT in the last 72 hours. Thyroid Function Tests: No results for input(s): TSH, T4TOTAL, FREET4, T3FREE, THYROIDAB in the last 72 hours. Anemia Panel: No results for input(s): VITAMINB12, FOLATE, FERRITIN, TIBC, IRON, RETICCTPCT in the last 72 hours. Urine analysis:    Component Value Date/Time   COLORURINE YELLOW (A) 12/04/2019 1159   APPEARANCEUR HAZY (A) 12/04/2019 1159   APPEARANCEUR Clear 04/21/2013 0715   LABSPEC 1.013 12/04/2019 1159   LABSPEC 1.015 04/21/2013 0715   PHURINE 7.0 12/04/2019 1159   GLUCOSEU NEGATIVE 12/04/2019 1159   GLUCOSEU 50 mg/dL 04/21/2013 0715   HGBUR SMALL (A) 12/04/2019 1159   BILIRUBINUR NEGATIVE 12/04/2019 1159   BILIRUBINUR Negative 04/21/2013 0715   KETONESUR NEGATIVE 12/04/2019 1159   PROTEINUR NEGATIVE 12/04/2019 1159   NITRITE NEGATIVE 12/04/2019 1159   LEUKOCYTESUR TRACE (A) 12/04/2019 1159   LEUKOCYTESUR Negative 04/21/2013 0715   Sepsis Labs: '@LABRCNTIP' (procalcitonin:4,lacticidven:4) )No results found for this or any previous visit (from the past 240 hour(s)).   Radiological Exams on Admission: DG Foot Complete Right  Result Date: 05/17/2020 CLINICAL DATA:  Pain and swelling in the second toe with concern for osteomyelitis EXAM: RIGHT FOOT COMPLETE - 3+ VIEW COMPARISON:  None. FINDINGS: Chronic appearing severe deformity throughout the hindfoot with apparent ankylosis of the calcaneus and tarsal bones. Ankylosis of the first MTP joint. Degenerative changes throughout  the tarsal metatarsal joints. No appreciable acute osseous erosions or periosteal reaction. No appreciable soft tissue gas. Soft tissue swelling throughout the right foot. No radiopaque foreign bodies. Vascular calcifications in the soft tissues. IMPRESSION: No specific radiographic findings of acute osteomyelitis. Diffuse soft tissue swelling. Chronic appearing severe deformity throughout the hindfoot with apparent ankylosis of the calcaneus and tarsal bones. Ankylosis of the first MTP joint.  Degenerative changes throughout the tarsometatarsal joints. Findings suggests chronic neuropathic change. Electronically Signed   By: Ilona Sorrel M.D.   On: 05/17/2020 15:02     EKG: Not done in ED, will get one.   Assessment/Plan Principal Problem:   Diabetic foot infection_right second toe  Active Problems:   HTN (hypertension)   Hypothyroidism (acquired)   Hyperlipidemia   CKD (chronic kidney disease), stage IV (HCC)   Type II diabetes mellitus with renal manifestations (HCC)   GERD (gastroesophageal reflux disease)   Diabetic foot ulcer with infection_right second toe: Patient does not have fever or leukocytosis.  Clinically not septic.  Hemodynamically stable. Dr. Cleda Mccreedy of podiatry, Dr. Delana Meyer of [facility] are consulted.  - Admitted to MedSurg bed as inpatient - Empiric antimicrobial treatment with Rocephin - Blood cultures x 2  - ESR and CRP - get ABI - wound care consult - NPO after MN  HTN (hypertension) -IV hydralazine as needed -Amlodipine, Coreg,  Hypothyroidism (acquired) -Synthroid  Hyperlipidemia -Lipitor  CKD (chronic kidney disease), stage IV ([place]): Stable.  Baseline creatinine 1.6-1.9 recently. Her creatinine is 1.88, BUN 32 today -f/u by BMP  Type II diabetes mellitus with renal manifestations [facility]): Recent A1c 7.0, not well controlled. Pt is taking tresiba and 70/30 insulin as needed -Sliding scale insulin -change Tresiba to lantus and decrease dose from prn 80 to 30  units daily  GERD (gastroesophageal reflux disease) -Protonix         DVT ppx: SQ Heparin   Code Status: Full code Family Communication:   Yes, patient's son   at bed side Disposition Plan:  Anticipate discharge back to previous environment Consults called:   Dr. Cleda Mccreedy of podiatry, Dr. Delana Meyer of [facility] are consulted. Admission status and Level of care: Med-Surg:    Med-surg bed as inpt        Status is: Inpatient  Remains inpatient appropriate because:Inpatient level of care appropriate due to severity of illness.  Patient has multiple comorbidities, now presents with diabetic foot ulcer with infection right second toe, also has poor circulation.  Patient may need amputation of right second toe and revascularization by [facility].  Her presentation is highly complicated.  Patient is at high risk of deteriorating.  Will need to be treated in hospital for at least 2 days.   Dispo: The patient is from: Home              Anticipated d/c is to: Home              Anticipated d/c date is: 2 days              Patient currently is not medically stable to d/c.   Difficult to place patient No         Date of Service 05/17/2020    Ivor Costa [facility]   If 7PM-7AM, please contact night-coverage [email or web address] 05/17/2020, 5:00 PM

## 2020-05-17 NOTE — ED Notes (Signed)
EDP at bedside examining pt. °

## 2020-05-17 NOTE — ED Notes (Addendum)
First RN note:  Pt brought over by [facility] for infection in the 2nd toe bone as well as diabetic ulcers.

## 2020-05-17 NOTE — ED Notes (Signed)
CBG 120 

## 2020-05-17 NOTE — Consult Note (Signed)
[facility] Nurse Consult Note: Reason for Consult: [name] nurse consult is simultaneously placed with podiatry. Dr. Cleda Mccreedy (patient's podiatric medicine physician) was consulted by Dr. Blaine Hamper today. Wound type: Neuropathic, trauma  Conservative wound care orders are placed and I acquiesce to the orders of Dr. Cleda Mccreedy whenever that consultation takes place.  [place] nursing team will not follow, but will remain available to this patient, the nursing and medical teams.  Please re-consult if needed. Thanks, [name], MSN, RN, [place], [name]  Pager# [phone]

## 2020-05-17 NOTE — Plan of Care (Signed)
  Problem: Pain Managment: Goal: General experience of comfort will improve Outcome: Progressing   Problem: Safety: Goal: Ability to remain free from injury will improve Outcome: Progressing   Problem: Skin Integrity: Goal: Risk for impaired skin integrity will decrease Outcome: Progressing   Problem: Clinical Measurements: Goal: Ability to avoid or minimize complications of infection will improve Outcome: Progressing   Problem: Skin Integrity: Goal: Skin integrity will improve Outcome: Progressing   Problem: Nutrition: Goal: Adequate nutrition will be maintained Outcome: Progressing   Problem: Activity: Goal: Risk for activity intolerance will decrease Outcome: Progressing   Problem: Clinical Measurements: Goal: Ability to maintain clinical measurements within normal limits will improve Outcome: Progressing Goal: Will remain free from infection Outcome: Progressing Goal: Diagnostic test results will improve Outcome: Progressing Goal: Respiratory complications will improve Outcome: Progressing Goal: Cardiovascular complication will be avoided Outcome: Progressing   Problem: Health Behavior/Discharge Planning: Goal: Ability to manage health-related needs will improve Outcome: Progressing   Problem: Education: Goal: Knowledge of General Education information will improve Description: Including pain rating scale, medication(s)/side effects and non-pharmacologic comfort measures Outcome: Progressing

## 2020-05-17 NOTE — ED Notes (Signed)
Pt to ED with son c/o diabetic ulcer on second toe, R foot that is worse than it was before and PCP recommended hospital eval. Pt denies pain. Toe is currently wrapped. Pt vomiting green bile. Provided emesis bag.

## 2020-05-17 NOTE — ED Notes (Signed)
X-ray at bedside

## 2020-05-17 NOTE — Progress Notes (Signed)
Son present on admit

## 2020-05-17 NOTE — ED Triage Notes (Addendum)
Pt comes from podiatry with possible toe infection on right foot second toe. Podiatry wants pt admitted for possibly procedure/antibiotics. Pt has son at bedside. Ulcer appears red with some white pus/discharge.

## 2020-05-17 NOTE — ED Provider Notes (Signed)
[facility] Emergency Department Provider Note  ____________________________________________  Time seen: Approximately 4:21 PM  I have reviewed the triage vital signs and the nursing notes.   HISTORY  Chief Complaint Foot Pain    HPI [name] [name] is a 75 y.o. female with a history of diabetes hypertension hyperlipidemia who was sent to the ED by her podiatrist due to diabetic foot ulcer on the right second toe.  Gradual onset, persistent despite wound care by podiatry.  No radiating pain.  Patient endorses associated chills.  I called Dr. Caryl Comes, her podiatrist, for further history.  He notes that the second toe likely needs to be amputated, but perfusion of the right foot seems marginal, and he is worried about ability to heal after  amputation.  Recommends hospitalization for antibiotic coverage, vascular consult to prepare her for podiatry intervention.  Patient also has a noninfected ulceration into subcutaneous fat on the right heel.   Past Medical History:  Diagnosis Date  . Diabetes mellitus without complication ([place])   . Humerus fracture 04/20/2018   left  . Hyperlipidemia   . Hypertension   . Hypothyroidism   . Vertigo    several yrs ago  . Wears dentures    partial upper and lower (loose)     Patient Active Problem List   Diagnosis Date Noted  . Diabetic foot infection_right second toe  05/17/2020  . CKD (chronic kidney disease), stage IV ([place]) 05/17/2020  . Type II diabetes mellitus with renal manifestations ([place]) 05/17/2020  . GERD (gastroesophageal reflux disease) 05/17/2020  . AKI (acute kidney injury) ([place]) 11/15/2019  . Physical deconditioning 11/15/2019  . Aspiration pneumonia ([place]) 10/10/2019  . Hyperkalemia   . Rib pain on left side   . Leukocytosis 10/04/2019  . Fall at home, initial encounter 10/04/2019  . Cellulitis of right leg 10/04/2019  . CKD stage 4 due to type 2 diabetes mellitus ([place]) 10/04/2019  . Acute kidney  injury superimposed on CKD ([place])   . RUQ pain   . Hyperlipidemia   . Right leg pain 10/03/2019  . Diabetic ulcer of right foot associated with type 2 diabetes mellitus, with fat layer exposed ([place]) 10/03/2019  . Cellulitis of right foot 10/03/2019  . HTN (hypertension) 10/03/2019  . Type 2 diabetes mellitus with stage 3 chronic kidney disease ([place]) 10/03/2019  . Hypothyroidism (acquired) 10/03/2019  . UTI (urinary tract infection) 10/03/2019  . Sepsis ([place]) 04/25/2018     Past Surgical History:  Procedure Laterality Date  . CATARACT EXTRACTION W/PHACO Left 12/01/2018   Procedure: CATARACT EXTRACTION PHACO AND INTRAOCULAR LENS PLACEMENT ([place]) LEFT DIABETIC;  Surgeon: Birder Robson, MD;  Location: [place];  Service: Ophthalmology;  Laterality: Left;  Diabetic - insulin  . COLONOSCOPY WITH PROPOFOL N/A 07/07/2019   Procedure: COLONOSCOPY WITH PROPOFOL;  Surgeon: Robert Bellow, MD;  Location: [facility] ENDOSCOPY;  Service: Endoscopy;  Laterality: N/A;  . ESOPHAGOGASTRODUODENOSCOPY (EGD) WITH PROPOFOL N/A 07/07/2019   Procedure: ESOPHAGOGASTRODUODENOSCOPY (EGD) WITH PROPOFOL;  Surgeon: Robert Bellow, MD;  Location: [facility] ENDOSCOPY;  Service: Endoscopy;  Laterality: N/A;  . FOOT SURGERY Right    lesion excision     Prior to Admission medications   Medication Sig Start Date End Date Taking? Authorizing Provider  amLODipine (NORVASC) 10 MG tablet Take 1 tablet by mouth daily. 05/02/16   [provider]  ASPIRIN 81 PO Take by mouth daily.    [provider]  atorvastatin (LIPITOR) 40 MG tablet Take 1 tablet  by mouth at bedtime.  05/02/16   [provider]  bisacodyl (DULCOLAX) 10 MG suppository Place 1 suppository (10 mg total) rectally as needed for moderate constipation. 04/02/20   Merlyn Lot, MD  carvedilol (COREG) 25 MG tablet Take 1 tablet by mouth 2 (two) times daily. 05/02/16   [provider]  donepezil (ARICEPT) 10 MG tablet  Take 10 mg by mouth at bedtime.  04/21/18   [provider]  insulin aspart protamine- aspart (NOVOLOG MIX 70/30) (70-30) 100 UNIT/ML injection Inject 15 Units into the skin 2 (two) times daily with a meal.    [provider]  LACTOBACILLUS RHAMNOSUS, GG, PO Take 1 tablet by mouth daily.    [provider]  lactulose (CHRONULAC) 10 GM/15ML solution Take 10 g by mouth daily.    [provider]  omeprazole (PRILOSEC) 20 MG capsule Take 20 mg by mouth daily.    [provider]  polyethylene glycol (MIRALAX / GLYCOLAX) 17 g packet Take 17 g by mouth daily. Mix one tablespoon with 8oz of your favorite juice or water every day until you are having soft formed stools. Then start taking once daily if you didn't have a stool the day before. 04/02/20   Merlyn Lot, MD  polyethylene glycol (MIRALAX / GLYCOLAX) 17 g packet Take 17 g by mouth daily. 04/02/20   Merlyn Lot, MD  promethazine (PHENERGAN) 25 MG/ML injection Inject 12.5 mg into the vein every 6 (six) hours as needed for nausea or vomiting.    [provider]  sodium bicarbonate 650 MG tablet Take 1 tablet (650 mg total) by mouth 3 (three) times daily. 12/07/19   Shawna Clamp, MD  SYNTHROID 50 MCG tablet Take 1 tablet by mouth daily. 04/21/18   [provider]  TRESIBA FLEXTOUCH 100 UNIT/ML SOPN FlexTouch Pen Inject 80 Units into the skin at bedtime.  04/21/18   [provider]     Allergies Patient has no known allergies.   Family History  Problem Relation Age of Onset  . Breast cancer Neg Hx     Social History Social History   Tobacco Use  . Smoking status: Never Smoker  . Smokeless tobacco: Never Used  Vaping Use  . Vaping Use: Never used  Substance Use Topics  . Alcohol use: No  . Drug use: Never    Review of Systems  Constitutional:   No fever positive chills.  ENT:   No sore throat. No rhinorrhea. Cardiovascular:   No chest pain or  syncope. Respiratory:   No dyspnea or cough. Gastrointestinal:   Negative for abdominal pain, vomiting and diarrhea.  Musculoskeletal: Chronic wounds on right foot heel and right foot second toe All other systems reviewed and are negative except as documented above in ROS and HPI.  ____________________________________________   PHYSICAL EXAM:  VITAL SIGNS: ED Triage Vitals  Enc Vitals Group     BP 05/17/20 1157 109/70     Pulse Rate 05/17/20 1157 (!) 57     Resp 05/17/20 1157 18     Temp 05/17/20 1157 98 F (36.7 C)     Temp Source 05/17/20 1157 Oral     SpO2 05/17/20 1157 97 %     Weight 05/17/20 1201 198 lb 6.6 oz (90 kg)     Height 05/17/20 1201 5\' 7"  (1.702 m)     Head Circumference --      Peak Flow --      Pain Score 05/17/20 1201 4  Pain Loc --      Pain Edu? --      Excl. in [place]? --     Vital signs reviewed, nursing assessments reviewed.   Constitutional:   Alert and oriented. Non-toxic appearance. Eyes:   Conjunctivae are normal. EOMI. PERRL. ENT      Head:   Normocephalic and atraumatic.      Nose:   Wearing a mask.      Mouth/Throat:   Wearing a mask.      Neck:   No meningismus. Full ROM. Hematological/Lymphatic/Immunilogical:   No cervical lymphadenopathy. Cardiovascular:   RRR with occasional ectopy. Symmetric bilateral radial and DP pulses.  No murmurs. Cap refill less than 2 seconds. Respiratory:   Normal respiratory effort without tachypnea/retractions. Breath sounds are clear and equal bilaterally. No wheezes/rales/rhonchi. Gastrointestinal:   Soft and nontender. Non distended. There is no CVA tenderness.  No rebound, rigidity, or guarding.  Musculoskeletal:   Normal range of motion in all extremities. No joint effusions.  No lower extremity tenderness.  No edema. Neurologic:   Normal speech and language.  Motor grossly intact. No acute focal neurologic deficits are appreciated.  Skin:    Skin is warm, dry with 1 cm ulceration through the dorsal  right second toe over the interphalangeal joint eroding down to bone.  There is purplish discoloration of the toe.  The whole foot demonstrates very sluggish capillary refill though it is not pale or cold.  DP pulse nonpalpable.  ____________________________________________    LABS (pertinent positives/negatives) (all labs ordered are listed, but only abnormal results are displayed) Labs Reviewed  COMPREHENSIVE METABOLIC PANEL - Abnormal; Notable for the following components:      Result Value   Glucose, Bld 193 (*)    BUN 32 (*)    Creatinine, Ser 1.88 (*)    Albumin 3.3 (*)    GFR, Estimated 28 (*)    All other components within normal limits  CBC WITH DIFFERENTIAL/PLATELET - Abnormal; Notable for the following components:   RBC 3.69 (*)    Hemoglobin 11.0 (*)    HCT 34.5 (*)    All other components within normal limits  SARS CORONAVIRUS 2 (TAT 6-24 HRS)  CULTURE, BLOOD (ROUTINE X 2)  CULTURE, BLOOD (ROUTINE X 2)  LACTIC ACID, PLASMA  C-REACTIVE PROTEIN  SEDIMENTATION RATE  APTT   ____________________________________________   EKG    ____________________________________________    RADIOLOGY  DG Foot Complete Right  Result Date: 05/17/2020 CLINICAL DATA:  Pain and swelling in the second toe with concern for osteomyelitis EXAM: RIGHT FOOT COMPLETE - 3+ VIEW COMPARISON:  None. FINDINGS: Chronic appearing severe deformity throughout the hindfoot with apparent ankylosis of the calcaneus and tarsal bones. Ankylosis of the first MTP joint. Degenerative changes throughout the tarsal metatarsal joints. No appreciable acute osseous erosions or periosteal reaction. No appreciable soft tissue gas. Soft tissue swelling throughout the right foot. No radiopaque foreign bodies. Vascular calcifications in the soft tissues. IMPRESSION: No specific radiographic findings of acute osteomyelitis. Diffuse soft tissue swelling. Chronic appearing severe deformity throughout the hindfoot with  apparent ankylosis of the calcaneus and tarsal bones. Ankylosis of the first MTP joint. Degenerative changes throughout the tarsometatarsal joints. Findings suggests chronic neuropathic change. Electronically Signed   By: Ilona Sorrel M.D.   On: 05/17/2020 15:02    ____________________________________________   PROCEDURES Procedures  ____________________________________________    CLINICAL IMPRESSION / ASSESSMENT AND PLAN / ED COURSE  Medications ordered in the ED: Medications  ondansetron (  ZOFRAN) injection 4 mg (has no administration in time range)  hydrALAZINE (APRESOLINE) injection 5 mg (has no administration in time range)  acetaminophen (TYLENOL) tablet 650 mg (has no administration in time range)  insulin aspart (novoLOG) injection 0-5 Units (has no administration in time range)  insulin aspart (novoLOG) injection 0-9 Units (has no administration in time range)    Pertinent labs & imaging results that were available during my care of the patient were reviewed by me and considered in my medical decision making (see chart for details).  [name] [name] was evaluated in Emergency Department on 05/17/2020 for the symptoms described in the history of present illness. She was evaluated in the context of the global COVID-19 pandemic, which necessitated consideration that the patient might be at risk for infection with the SARS-CoV-2 virus that causes COVID-19. Institutional protocols and algorithms that pertain to the evaluation of patients at risk for COVID-19 are in a state of rapid change based on information released by regulatory bodies including the CDC and federal and state organizations. These policies and algorithms were followed during the patient's care in the ED.   Patient presents to ED with diabetic foot ulcer on the right second toe and on the right heel.  Concern for vascular insufficiency in the foot.  Case discussed with hospitalist for further management including consults  to podiatry and vascular surgery, empiric antibiotic coverage which I would defer to admitting team since patient is not septic.      ____________________________________________   FINAL CLINICAL IMPRESSION(S) / ED DIAGNOSES    Final diagnoses:  Type 2 diabetes mellitus with right diabetic foot ulcer [facility])     ED Discharge Orders    None      Portions of this note were generated with dragon dictation software. Dictation errors may occur despite best attempts at proofreading.   Carrie Mew, MD 05/17/20 [phone]

## 2020-05-18 ENCOUNTER — Other Ambulatory Visit (INDEPENDENT_AMBULATORY_CARE_PROVIDER_SITE_OTHER): Payer: Self-pay | Admitting: Vascular Surgery

## 2020-05-18 ENCOUNTER — Encounter (INDEPENDENT_AMBULATORY_CARE_PROVIDER_SITE_OTHER): Payer: Self-pay

## 2020-05-18 ENCOUNTER — Encounter (INDEPENDENT_AMBULATORY_CARE_PROVIDER_SITE_OTHER): Payer: Self-pay | Admitting: Vascular Surgery

## 2020-05-18 ENCOUNTER — Inpatient Hospital Stay: Payer: Medicare Other

## 2020-05-18 DIAGNOSIS — L089 Local infection of the skin and subcutaneous tissue, unspecified: Secondary | ICD-10-CM | POA: Diagnosis not present

## 2020-05-18 DIAGNOSIS — E11628 Type 2 diabetes mellitus with other skin complications: Principal | ICD-10-CM

## 2020-05-18 DIAGNOSIS — L97519 Non-pressure chronic ulcer of other part of right foot with unspecified severity: Secondary | ICD-10-CM

## 2020-05-18 DIAGNOSIS — E11621 Type 2 diabetes mellitus with foot ulcer: Secondary | ICD-10-CM

## 2020-05-18 LAB — BASIC METABOLIC PANEL
Anion gap: 8 (ref 5–15)
BUN: 36 mg/dL — ABNORMAL HIGH (ref 8–23)
CO2: 25 mmol/L (ref 22–32)
Calcium: 8.7 mg/dL — ABNORMAL LOW (ref 8.9–10.3)
Chloride: 103 mmol/L (ref 98–111)
Creatinine, Ser: 1.96 mg/dL — ABNORMAL HIGH (ref 0.44–1.00)
GFR, Estimated: 27 mL/min — ABNORMAL LOW (ref >60)
Glucose, Bld: 118 mg/dL — ABNORMAL HIGH (ref 70–99)
Potassium: 3.9 mmol/L (ref 3.5–5.1)
Sodium: 136 mmol/L (ref 135–145)

## 2020-05-18 LAB — CBC
HCT: 29.2 % — ABNORMAL LOW (ref 36.0–46.0)
Hemoglobin: 9.4 g/dL — ABNORMAL LOW (ref 12.0–15.0)
MCH: 29.9 pg (ref 26.0–34.0)
MCHC: 32.2 g/dL (ref 30.0–36.0)
MCV: 93 fL (ref 80.0–100.0)
Platelets: 215 10*3/uL (ref 150–400)
RBC: 3.14 MIL/uL — ABNORMAL LOW (ref 3.87–5.11)
RDW: 13.7 % (ref 11.5–15.5)
WBC: 7.7 10*3/uL (ref 4.0–10.5)
nRBC: 0 % (ref 0.0–0.2)

## 2020-05-18 LAB — GLUCOSE, CAPILLARY
Glucose-Capillary: 126 mg/dL — ABNORMAL HIGH (ref 70–99)
Glucose-Capillary: 143 mg/dL — ABNORMAL HIGH (ref 70–99)
Glucose-Capillary: 158 mg/dL — ABNORMAL HIGH (ref 70–99)
Glucose-Capillary: 97 mg/dL (ref 70–99)

## 2020-05-18 LAB — SARS CORONAVIRUS 2 (TAT 6-24 HRS): SARS Coronavirus 2: NEGATIVE

## 2020-05-18 MED ORDER — SODIUM CHLORIDE 0.9 % IV SOLN
3.0000 g | Freq: Three times a day (TID) | INTRAVENOUS | Status: DC
  Administered 2020-05-18 – 2020-05-23 (×14): 3 g via INTRAVENOUS
  Filled 2020-05-18: qty 3
  Filled 2020-05-18 (×2): qty 8
  Filled 2020-05-18: qty 3
  Filled 2020-05-18 (×3): qty 8
  Filled 2020-05-18: qty 3
  Filled 2020-05-18 (×3): qty 8
  Filled 2020-05-18: qty 3
  Filled 2020-05-18: qty 2.14
  Filled 2020-05-18: qty 8
  Filled 2020-05-18: qty 3
  Filled 2020-05-18 (×2): qty 8

## 2020-05-18 NOTE — H&P (View-Only) (Signed)
[name] [name]  MRN : [id number]  [name] [name] is a 75 y.o. ([birth year]) female who presents with chief complaint of  Chief Complaint  Patient presents with  . Foot Pain   History of Present Illness:  [name] [name] is a 75 y.o. female with medical history significant of DM, HTN, HLD, CKD-IV, hypothyroidism, gout, who presents with  foot ulcer with infection in right second toe.   Pt states that she noted a ulcer in dorsal aspects of right secondary toe about 1 week ago, which has been progressively worsening.  She noted little pus drainage.  She denies pain, fever or chills.  She was seen by Dr. Cleda Mccreedy of podiatry in office today. EDP, Dr. Joni Fears spoke with Dr. Cleda Mccreedy by phone. Per Dr. Cleda Mccreedy, pt likely needs right second toe amputation, but perfusion of the right foot seems marginal, and he is worried about ability to heal afteramputation. He recommends hospitalization for antibiotic coverage, vascular consult to prepare her for podiatry intervention. Patient also has a noninfected ulceration into subcutaneous fat on the right heel.  Patient does not have chest pain, cough, shortness breath.  Patient had nausea and vomited once earlier, which has resolved.  Currently patient does not have nausea vomiting, diarrhea or abdominal pain.  No symptoms of UTI.  Patient did have an appointment with Dr. Delana Meyer today in regard to her possible peripheral artery disease however was sent to the emergency room yesterday for treatment.  Vascular surgery was asked to consult by Dr. Blaine Hamper for possible endovascular intervention.  Current Facility-Administered Medications  Medication Dose Route Frequency Provider Last Rate Last Admin  . acetaminophen (TYLENOL) tablet 650 mg  650 mg Oral Q6H PRN Ivor Costa, MD      . amLODipine (NORVASC) tablet 10 mg  10 mg Oral Daily Ivor Costa, MD   10 mg at 05/18/20 0909  . Ampicillin-Sulbactam (UNASYN) 3 g in sodium chloride 0.9 %  100 mL IVPB  3 g Intravenous Q8H Fritzi Mandes, MD      . aspirin chewable tablet 81 mg  81 mg Oral Daily Ivor Costa, MD   81 mg at 05/18/20 0906  . atorvastatin (LIPITOR) tablet 40 mg  40 mg Oral Daily Ivor Costa, MD   40 mg at 05/18/20 0909  . bisacodyl (DULCOLAX) suppository 10 mg  10 mg Rectal PRN Ivor Costa, MD      . carvedilol (COREG) tablet 25 mg  25 mg Oral BID Ivor Costa, MD   25 mg at 05/18/20 0909  . donepezil (ARICEPT) tablet 10 mg  10 mg Oral QHS Ivor Costa, MD   10 mg at 05/17/20 2245  . heparin injection 5,000 Units  5,000 Units Subcutaneous Q8H Ivor Costa, MD   5,000 Units at 05/17/20 2245  . hydrALAZINE (APRESOLINE) injection 5 mg  5 mg Intravenous Q2H PRN Ivor Costa, MD      . insulin aspart (novoLOG) injection 0-5 Units  0-5 Units Subcutaneous QHS Ivor Costa, MD      . insulin aspart (novoLOG) injection 0-9 Units  0-9 Units Subcutaneous TID WC Ivor Costa, MD   1 Units at 05/18/20 1226  . insulin glargine (LANTUS) injection 30 Units  30 Units Subcutaneous Q2000 Ivor Costa, MD   30 Units at 05/17/20 2310  . levothyroxine (SYNTHROID) tablet 50 mcg  50 mcg Oral Q0600 Ivor Costa, MD      . ondansetron [facility]) injection 4 mg  4 mg Intravenous Q8H PRN  Ivor Costa, MD       Past Medical History:  Diagnosis Date  . Diabetes mellitus without complication ([place])   . Humerus fracture 04/20/2018   left  . Hyperlipidemia   . Hypertension   . Hypothyroidism   . Vertigo    several yrs ago  . Wears dentures    partial upper and lower (loose)   Past Surgical History:  Procedure Laterality Date  . CATARACT EXTRACTION W/PHACO Left 12/01/2018   Procedure: CATARACT EXTRACTION PHACO AND INTRAOCULAR LENS PLACEMENT ([place]) LEFT DIABETIC;  Surgeon: Birder Robson, MD;  Location: [facility];  Service: Ophthalmology;  Laterality: Left;  Diabetic - insulin  . COLONOSCOPY WITH PROPOFOL N/A 07/07/2019   Procedure: COLONOSCOPY WITH PROPOFOL;  Surgeon: Robert Bellow, MD;  Location: [facility]  ENDOSCOPY;  Service: Endoscopy;  Laterality: N/A;  . ESOPHAGOGASTRODUODENOSCOPY (EGD) WITH PROPOFOL N/A 07/07/2019   Procedure: ESOPHAGOGASTRODUODENOSCOPY (EGD) WITH PROPOFOL;  Surgeon: Robert Bellow, MD;  Location: [facility] ENDOSCOPY;  Service: Endoscopy;  Laterality: N/A;  . FOOT SURGERY Right    lesion excision   Social History Social History   Tobacco Use  . Smoking status: Never Smoker  . Smokeless tobacco: Never Used  Vaping Use  . Vaping Use: Never used  Substance Use Topics  . Alcohol use: No  . Drug use: Never   Family History Family History  Problem Relation Age of Onset  . Breast [name] Neg Hx   Denies family history of peripheral artery disease, venous disease or renal disease.  No Known Allergies  REVIEW OF SYSTEMS (Negative unless checked)  Constitutional: [] Weight loss  [] Fever  [] Chills Cardiac: [] Chest pain   [] Chest pressure   [] Palpitations   [] Shortness of breath when laying flat   [] Shortness of breath at rest   [] Shortness of breath with exertion. Vascular:  [] Pain in legs with walking   [] Pain in legs at rest   [] Pain in legs when laying flat   [] Claudication   [x] Pain in feet when walking  [x] Pain in feet at rest  [x] Pain in feet when laying flat   [] History of DVT   [] Phlebitis   [] Swelling in legs   [] Varicose veins   [x] Non-healing ulcers Pulmonary:   [] Uses home oxygen   [] Productive cough   [] Hemoptysis   [] Wheeze  [] COPD   [] Asthma Neurologic:  [] Dizziness  [] Blackouts   [] Seizures   [] History of stroke   [] History of TIA  [] Aphasia   [] Temporary blindness   [] Dysphagia   [] Weakness or numbness in arms   [] Weakness or numbness in legs Musculoskeletal:  [] Arthritis   [] Joint swelling   [] Joint pain   [] Low back pain Hematologic:  [] Easy bruising  [] Easy bleeding   [] Hypercoagulable state   [] Anemic  [] Hepatitis Gastrointestinal:  [] Blood in stool   [] Vomiting blood  [] Gastroesophageal reflux/heartburn   [] Difficulty swallowing. Genitourinary:   [] Chronic kidney disease   [] Difficult urination  [] Frequent urination  [] Burning with urination   [] Blood in urine Skin:  [] Rashes   [x] Ulcers   [x] Wounds Psychological:  [] History of anxiety   []  History of major depression.  Physical Examination  Vitals:   05/18/20 0046 05/18/20 0400 05/18/20 0755 05/18/20 1212  BP: (!) 138/56 135/64 139/63 (!) 105/55  Pulse: 67 66 68 71  Resp:  18 17 17   Temp:  99.2 F (37.3 C) 98.6 F (37 C) 98.3 F (36.8 C)  TempSrc:  Oral    SpO2: 100% 100% 97% 98%  Weight:  Height:       Body mass index is 31.08 kg/m. Gen:  WD/WN, NAD Head: [place]/AT, No temporalis wasting. Prominent temp pulse not noted. Ear/Nose/Throat: Hearing grossly intact, nares w/o erythema or drainage, oropharynx w/o Erythema/Exudate Eyes: Sclera non-icteric, conjunctiva clear Neck: Trachea midline.  No JVD.  Pulmonary:  Good air movement, respirations not labored, equal bilaterally.  Cardiac: RRR, normal S1, S2. Vascular:  Vessel Right Left  Radial Palpable Palpable  Ulnar Palpable Palpable  Brachial Palpable Palpable  Carotid Palpable, without bruit Palpable, without bruit  Aorta Not palpable N/A  Femoral Palpable Palpable  Popliteal Palpable Palpable  PT Non-Palpable Non-Palpable  DP Non-Palpable Non-Palpable   Right foot: Ulcer noted to the right second toe.  Gastrointestinal: soft, non-tender/non-distended. No guarding/reflex.  Musculoskeletal: M/S 5/5 throughout.  Extremities without ischemic changes.  No deformity or atrophy. No edema. Neurologic: Sensation grossly intact in extremities.  Symmetrical.  Speech is fluent. Motor exam as listed above. Psychiatric: Judgment intact, Mood & affect appropriate for pt's clinical situation. Dermatologic: As above Lymph : No Cervical, Axillary, or Inguinal lymphadenopathy.  CBC Lab Results  Component Value Date   WBC 7.7 05/18/2020   HGB 9.4 (L) 05/18/2020   HCT 29.2 (L) 05/18/2020   MCV 93.0 05/18/2020   PLT 215  05/18/2020   BMET    Component Value Date/Time   NA 136 05/18/2020 0541   NA 142 04/17/2014 1131   K 3.9 05/18/2020 0541   K 4.0 04/17/2014 1131   CL 103 05/18/2020 0541   CL 109 (H) 04/17/2014 1131   CO2 25 05/18/2020 0541   CO2 24 04/17/2014 1131   GLUCOSE 118 (H) 05/18/2020 0541   GLUCOSE 89 04/17/2014 1131   BUN 36 (H) 05/18/2020 0541   BUN 22 (H) 04/17/2014 1131   CREATININE 1.96 (H) 05/18/2020 0541   CREATININE 1.78 (H) 04/17/2014 1131   CALCIUM 8.7 (L) 05/18/2020 0541   CALCIUM 8.8 04/17/2014 1131   GFRNONAA 27 (L) 05/18/2020 0541   GFRNONAA 30 (L) 04/17/2014 1131   GFRNONAA 37 (L) 04/22/2013 0946   GFRAA 29 (L) 12/07/2019 0645   GFRAA 37 (L) 04/17/2014 1131   GFRAA 42 (L) 04/22/2013 0946   Estimated Creatinine Clearance: 29.5 mL/min (A) (by C-G formula based on SCr of 1.96 mg/dL (H)).  COAG Lab Results  Component Value Date   INR 1.2 10/11/2019   INR 1.2 10/10/2019   Radiology US ARTERIAL ABI (SCREENING LOWER EXTREMITY)  Result Date: 05/18/2020 CLINICAL DATA:  75 year old female with history of diabetic foot infection. EXAM: NONINVASIVE PHYSIOLOGIC VASCULAR STUDY OF BILATERAL LOWER EXTREMITIES TECHNIQUE: Evaluation of both lower extremities were performed at rest, including calculation of ankle-brachial indices with single level Doppler, pressure and pulse volume recording. COMPARISON:  None. FINDINGS: Right ABI:  Unable to calculate due to noncompressible vessels. Left ABI:  Unable to calculate due to noncompressible vessels. Right Lower Extremity:  Normal arterial waveforms at the ankle. Left Lower Extremity:  Normal arterial waveforms at the ankle. 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 Normal arterial waveforms at the level of the ankle. If there is clinical concern for significant lower extremity peripheral artery disease, recommend CTA, MRA, or direct angiography for further  evaluation. [name] Cancer, MD [facility] Radiology Electronically Signed   By: [name] Cancer MD   On: 05/18/2020 10:19   DG Foot Complete Right  Result Date: 05/17/2020 CLINICAL DATA:  Pain and swelling in the second  toe with concern for osteomyelitis EXAM: RIGHT FOOT COMPLETE - 3+ VIEW COMPARISON:  None. FINDINGS: Chronic appearing severe deformity throughout the hindfoot with apparent ankylosis of the calcaneus and tarsal bones. Ankylosis of the first MTP joint. Degenerative changes throughout the tarsal metatarsal joints. No appreciable acute osseous erosions or periosteal reaction. No appreciable soft tissue gas. Soft tissue swelling throughout the right foot. No radiopaque foreign bodies. Vascular calcifications in the soft tissues. IMPRESSION: No specific radiographic findings of acute osteomyelitis. Diffuse soft tissue swelling. Chronic appearing severe deformity throughout the hindfoot with apparent ankylosis of the calcaneus and tarsal bones. Ankylosis of the first MTP joint. Degenerative changes throughout the tarsometatarsal joints. Findings suggests chronic neuropathic change. Electronically Signed   By: [name] [name] M.D.   On: 05/17/2020 15:02   Assessment/Plan FALLYNN GRAVETT is a 75 y.o. female with medical history significant of DM, HTN, HLD, CKD-IV, hypothyroidism, gout, who presents with  foot ulcer with infection in right second toe.   1.  Right foot wound: Patient with aggressively worsening right foot wound.  Initially, the patient had an an outpatient appointment with Dr. Delana Meyer today in regard to her peripheral artery disease however was admitted through the ED last night.  In the setting of chronic wound with multiple risk factors for atherosclerotic disease recommend undergoing a right lower extremity angiogram possible intervention and attempt assess the patient's anatomy and contributing degree of atherosclerotic disease.  If  appropriate, an attempt to revascularize the leg can be made at that time.  Procedure, risks and benefits were sent to the patient and her son who are at the bedside.  All questions were answered.  The patient wishes to proceed.  2.  Diabetic: On appropriate medications Encouraged good control as its slows the progression of atherosclerotic disease.   3.  Hyperlipidemia: On aspirin and statin for medical management Encouraged good control as its slows the progression of atherosclerotic disease.   Discussed with Dr. Francene Castle, PA-C  05/18/2020 1:56 PM  This [name] was created with Dragon medical transcription system.  Any error is purely unintentional

## 2020-05-18 NOTE — TOC Initial Note (Signed)
Transition of Care [facility] - Initial/Assessment Note    Patient Details  Name: [name] [name] MRN: [id number] Date of Birth: 1946/02/21  Transition of Care [facility] CM/SW Contact:    Shelbie Ammons, RN Phone Number: 05/18/2020, 2:51 PM  Clinical Narrative:                 Patient is from home with her son and he provides transportation. Her PCP is [facility] and this is where she gets her medications as well.         Patient Goals and CMS Choice        Expected Discharge Plan and Services                                                Prior Living Arrangements/Services                       Activities of Daily Living   ADL Screening (condition at time of admission) Patient's cognitive ability adequate to safely complete daily activities?: No Is the patient deaf or have difficulty hearing?: No Does the patient have difficulty seeing, even when wearing glasses/contacts?: No Does the patient have difficulty concentrating, remembering, or making decisions?: No Patient able to express need for assistance with ADLs?: Yes Does the patient have difficulty dressing or bathing?: Yes Independently performs ADLs?: No Communication: Independent Dressing (OT): Needs assistance Is this a change from baseline?: Pre-admission baseline Grooming: Needs assistance Is this a change from baseline?: Pre-admission baseline Feeding: Independent Is this a change from baseline?: Pre-admission baseline Bathing: Needs assistance Is this a change from baseline?: Pre-admission baseline Toileting: Needs assistance Is this a change from baseline?: Pre-admission baseline In/Out Bed: Needs assistance Is this a change from baseline?: Pre-admission baseline Walks in Home: Needs assistance Is this a change from baseline?: Pre-admission baseline Does the patient have difficulty walking or climbing stairs?: Yes Weakness of Legs: Both Weakness of Arms/Hands: None  Permission  Sought/Granted                  Emotional Assessment              Admission diagnosis:  Pain [R52] Diabetic foot infection ([place]) [P10.258, L08.9] 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 Diabetic foot infection_right second toe  05/17/2020  . CKD (chronic kidney disease), stage IV ([place]) 05/17/2020  . Type II diabetes mellitus with renal manifestations ([place]) 05/17/2020  . GERD (gastroesophageal reflux disease) 05/17/2020  . AKI (acute kidney injury) ([place]) 11/15/2019  . Physical deconditioning 11/15/2019  . Aspiration pneumonia ([place]) 10/10/2019  . Hyperkalemia   . Rib pain on left side   . Leukocytosis 10/04/2019  . Fall at home, initial encounter 10/04/2019  . Cellulitis of right leg 10/04/2019  . CKD stage 4 due to type 2 diabetes mellitus ([place]) 10/04/2019  . Acute kidney injury superimposed on CKD ([place])   . RUQ pain   . Hyperlipidemia   . Right leg pain 10/03/2019  . Diabetic ulcer of right foot associated with type 2 diabetes mellitus, with fat layer exposed ([place]) 10/03/2019  . Cellulitis of right foot 10/03/2019  . HTN (hypertension) 10/03/2019  . Type 2 diabetes mellitus with stage 3 chronic kidney disease ([place]) 10/03/2019  .  Hypothyroidism (acquired) 10/03/2019  . UTI (urinary tract infection) 10/03/2019  . Sepsis ([place]) 04/25/2018   PCP:  Marguerita Merles, MD Pharmacy:   [place], [place] [address] [place] [address] Phone: [phone] Fax: [phone]     Social Determinants of Health (SDOH) Interventions    Readmission Risk Interventions Readmission Risk Prevention Plan 05/18/2020 11/19/2019  Transportation Screening Complete Complete  PCP or Specialist Appt within 3-5 Days Complete Complete  HRI or Home Care Consult Complete Complete  Social Work Consult for [place] Planning/Counseling Complete  Complete  Palliative Care Screening Not Applicable Complete  Medication Review Press photographer) Complete Complete  Some recent data might be hidden

## 2020-05-18 NOTE — Progress Notes (Addendum)
[name] [name] at [place] NAME: [name] [name]    MR#:  [id number]  DATE OF BIRTH:  1945-08-14  SUBJECTIVE:  patient came in with right second to foot ulcer. She was seen in the podiatry clinic by Dr. Cleda Mccreedy who send her to the ER for further evaluation. Patient denies any pain. Son is at bedside.  She tells me she is hungry and has not eaten since yesterday REVIEW OF SYSTEMS:   Review of Systems  Constitutional: Negative for chills, fever and weight loss.  HENT: Negative for ear discharge, ear pain and nosebleeds.   Eyes: Negative for blurred vision, pain and discharge.  Respiratory: Negative for sputum production, shortness of breath, wheezing and stridor.   Cardiovascular: Negative for chest pain, palpitations, orthopnea and PND.  Gastrointestinal: Negative for abdominal pain, diarrhea, nausea and vomiting.  Genitourinary: Negative for frequency and urgency.  Musculoskeletal: Negative for back pain and joint pain.  Neurological: Negative for sensory change, speech change, focal weakness and weakness.  Psychiatric/Behavioral: Negative for depression and hallucinations. The patient is not nervous/anxious.    Tolerating Diet: yes Tolerating PT: patient is bedbound at baseline  DRUG ALLERGIES:  No Known Allergies  VITALS:  Blood pressure (!) 105/55, pulse 71, temperature 98.3 F (36.8 C), resp. rate 17, height 5\' 7"  (1.702 m), weight 90 kg, SpO2 98 %.  PHYSICAL EXAMINATION:   Physical Exam  GENERAL:  75 y.o.-year-old patient lying in the bed with no acute distress. Obese LUNGS: Normal breath sounds bilaterally, no wheezing, rales, rhonchi. No use of accessory muscles of respiration.  CARDIOVASCULAR: S1, S2 normal. No murmurs, rubs, or gallops.  ABDOMEN: Soft, nontender, nondistended. Bowel sounds present. No organomegaly or mass.  EXTREMITIES:    NEUROLOGIC: Cranial nerves II through XII are intact. No focal Motor or sensory deficits  b/l.   PSYCHIATRIC:  patient is alert and oriented x 3.  SKIN:  Pressure Injury 11/16/19 Sacrum Posterior (Active)  11/16/19 1544  Location: Sacrum  Location Orientation: Posterior  Staging:   Wound Description (Comments):   Present on Admission: Yes     Pressure Injury 12/04/19 Ankle Anterior;Right Unstageable - Full thickness tissue loss in which the base of the injury is covered by slough (yellow, tan, gray, green or [place]) and/or eschar (tan, [place] or black) in the wound bed. (Active)  12/04/19 1837  Location: Ankle  Location Orientation: Anterior;Right  Staging: Unstageable - Full thickness tissue loss in which the base of the injury is covered by slough (yellow, tan, gray, green or [place]) and/or eschar (tan, [place] or black) in the wound bed.  Wound Description (Comments):   Present on Admission:          LABORATORY PANEL:  CBC Recent Labs  Lab 05/18/20 0541  WBC 7.7  HGB 9.4*  HCT 29.2*  PLT 215    Chemistries  Recent Labs  Lab 05/17/20 1204 05/18/20 0541  NA 139 136  K 4.3 3.9  CL 105 103  CO2 22 25  GLUCOSE 193* 118*  BUN 32* 36*  CREATININE 1.88* 1.96*  CALCIUM 8.9 8.7*  AST 18  --   ALT 11  --   ALKPHOS 77  --   BILITOT 0.5  --    Cardiac Enzymes No results for input(s): TROPONINI in the last 168 hours. RADIOLOGY:  US ARTERIAL ABI (SCREENING LOWER EXTREMITY)  Result Date: 05/18/2020 CLINICAL DATA:  75 year old female with history of diabetic foot infection. EXAM: NONINVASIVE PHYSIOLOGIC VASCULAR STUDY  OF BILATERAL LOWER EXTREMITIES TECHNIQUE: Evaluation of both lower extremities were performed at rest, including calculation of ankle-brachial indices with single level Doppler, pressure and pulse volume recording. COMPARISON:  None. FINDINGS: Right ABI:  Unable to calculate due to noncompressible vessels. Left ABI:  Unable to calculate due to noncompressible vessels. Right Lower Extremity:  Normal arterial waveforms at the ankle. Left Lower Extremity:   Normal arterial waveforms at the ankle. 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 Normal arterial waveforms at the level of the ankle. If there is clinical concern for significant lower extremity peripheral artery disease, recommend CTA, MRA, or direct angiography for further evaluation. Ruthann Cancer, MD [facility] Radiology Electronically Signed   By: Ruthann Cancer MD   On: 05/18/2020 10:19   DG Foot Complete Right  Result Date: 05/17/2020 CLINICAL DATA:  Pain and swelling in the second toe with concern for osteomyelitis EXAM: RIGHT FOOT COMPLETE - 3+ VIEW COMPARISON:  None. FINDINGS: Chronic appearing severe deformity throughout the hindfoot with apparent ankylosis of the calcaneus and tarsal bones. Ankylosis of the first MTP joint. Degenerative changes throughout the tarsal metatarsal joints. No appreciable acute osseous erosions or periosteal reaction. No appreciable soft tissue gas. Soft tissue swelling throughout the right foot. No radiopaque foreign bodies. Vascular calcifications in the soft tissues. IMPRESSION: No specific radiographic findings of acute osteomyelitis. Diffuse soft tissue swelling. Chronic appearing severe deformity throughout the hindfoot with apparent ankylosis of the calcaneus and tarsal bones. Ankylosis of the first MTP joint. Degenerative changes throughout the tarsometatarsal joints. Findings suggests chronic neuropathic change. Electronically Signed   By: Ilona Sorrel M.D.   On: 05/17/2020 15:02   ASSESSMENT AND PLAN:  [name] [name] is a 75 y.o. female with medical history significant of DM, HTN, HLD, CKD-IV, hypothyroidism, gout, who presents with  foot ulcer with infection in right second toe.  Pt states that she noted a ulcer in dorsal aspects of right secondary toe about 1 week ago, which has been progressively worsening.  She noted little pus  drainage.  Diabetic foot ulcer with infection_right second toe: Patient does not have fever or leukocytosis.  Clinically not septic.  Hemodynamically stable. Dr. Cleda Mccreedy of podiatry, Dr. Delana Meyer of [facility] are consulted. 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 Normal arterial waveforms at the level of the ankle. --Patient seen by vascular surgery Dr. Delana Meyer who plans angiogram tomorrow -- podiatry consult with Dr. Vickki Muff appreciated plans for debridement over the weekend.  HTN (hypertension) -IV hydralazine as needed -Amlodipine, Coreg,  Hypothyroidism (acquired) -Synthroid  Hyperlipidemia -Lipitor  CKD (chronic kidney disease), stage IV ([place]): Stable.  Baseline creatinine 1.6-1.9 recently. Her creatinine is 1.88, BUN 3  Type II diabetes mellitus with renal manifestations [facility]): Recent A1c 7.0, not well controlled. - Pt is taking tresiba and 70/30 insulin as needed -Sliding scale insulin -change Tresiba to lantus and decrease dose from prn 80 to 30 units daily  GERD (gastroesophageal reflux disease) -Protonix  At baseline patient has been bedbound for more than seven months. She is taken care of by son and other family members at home.  DVT ppx: SQ Heparin   Code Status: Full code Family Communication:   Yes, patient's son   at bed side Disposition Plan:  Anticipate discharge back to previous environment Consults called:   Dr. Cleda Mccreedy of  podiatry, Dr. Delana Meyer of [facility] are consulted. Admission status and Level of care: Med-Surg:    Med-surg bed as inpt        Status is: Inpatient  Remains inpatient appropriate because:Inpatient level of care appropriate due to severity of illness.  Patient has multiple comorbidities, now presents with diabetic foot ulcer with infection right second toe, also has poor circulation.  Patient  may need amputation of right second toe and revascularization by [facility].Will need to be treated in hospital for at least 2 days.   Dispo: The patient is from: Home  Anticipated d/c is to: Home  Anticipated d/c date is: 2 days  Patient currently is not medically stable to d/c.              Difficult to place patient No     Level of care: Med-Surg Status is: Inpatient     TOTAL TIME TAKING CARE OF THIS PATIENT: *35* minutes.  >50% time spent on counselling and coordination of care  Note: This dictation was prepared with Dragon dictation along with smaller phrase technology. Any transcriptional errors that result from this process are unintentional.  Fritzi Mandes M.D    [facility]   CC: Primary care physician; Marguerita Merles, MDPatient ID: [name], female   DOB: Nov 21, 1945, 75 y.o.   MRN: [id number]

## 2020-05-18 NOTE — Plan of Care (Signed)

## 2020-05-18 NOTE — Consult Note (Signed)
ORTHOPAEDIC CONSULTATION  REQUESTING PHYSICIAN: Fritzi Mandes, MD  Chief Complaint: Right second toe wound  HPI: [name] [name] is a 75 y.o. female who complains of worsening wound to her right second toe.  Seen in outpatient clinic by Dr. Cleda Mccreedy.  Noted bone protruding through the toe with redness to the toe extending to the dorsal foot.  Asked to see in regards to care of this.  She has a longstanding history of peripheral vascular disease.  Vascular surgery is planning for angio tomorrow.  Past Medical History:  Diagnosis Date  . Diabetes mellitus without complication ([place])   . Humerus fracture 04/20/2018   left  . Hyperlipidemia   . Hypertension   . Hypothyroidism   . Vertigo    several yrs ago  . Wears dentures    partial upper and lower (loose)   Past Surgical History:  Procedure Laterality Date  . CATARACT EXTRACTION W/PHACO Left 12/01/2018   Procedure: CATARACT EXTRACTION PHACO AND INTRAOCULAR LENS PLACEMENT ([place]) LEFT DIABETIC;  Surgeon: Birder Robson, MD;  Location: [place];  Service: Ophthalmology;  Laterality: Left;  Diabetic - insulin  . COLONOSCOPY WITH PROPOFOL N/A 07/07/2019   Procedure: COLONOSCOPY WITH PROPOFOL;  Surgeon: Robert Bellow, MD;  Location: [facility] ENDOSCOPY;  Service: Endoscopy;  Laterality: N/A;  . ESOPHAGOGASTRODUODENOSCOPY (EGD) WITH PROPOFOL N/A 07/07/2019   Procedure: ESOPHAGOGASTRODUODENOSCOPY (EGD) WITH PROPOFOL;  Surgeon: Robert Bellow, MD;  Location: [facility] ENDOSCOPY;  Service: Endoscopy;  Laterality: N/A;  . FOOT SURGERY Right    lesion excision   Social History   Socioeconomic History  . Marital status: Divorced    Spouse name: Not on file  . Number of children: Not on file  . Years of education: Not on file  . Highest education level: Not on file  Occupational History  . Not on file  Tobacco Use  . Smoking status: Never Smoker  . Smokeless tobacco: Never Used  Vaping Use  . Vaping Use: Never used  Substance and  Sexual Activity  . Alcohol use: No  . Drug use: Never  . Sexual activity: Not on file  Other Topics Concern  . Not on file  Social History Narrative  . Not on file   Social Determinants of Health   Financial Resource Strain: Not on file  Food Insecurity: Not on file  Transportation Needs: Not on file  Physical Activity: Not on file  Stress: Not on file  Social Connections: Not on file   Family History  Problem Relation Age of Onset  . Breast cancer Neg Hx    No Known Allergies Prior to Admission medications   Medication Sig Start Date End Date Taking? Authorizing Provider  amLODipine (NORVASC) 10 MG tablet Take 1 tablet by mouth daily. 05/02/16  Yes [provider]  ASPIRIN 81 PO Take by mouth daily.   Yes [provider]  atorvastatin (LIPITOR) 40 MG tablet Take 1 tablet by mouth at bedtime.  05/02/16  Yes [provider]  carvedilol (COREG) 25 MG tablet Take 1 tablet by mouth 2 (two) times daily. 05/02/16  Yes [provider]  donepezil (ARICEPT) 10 MG tablet Take 10 mg by mouth at bedtime.  04/21/18  Yes [provider]  insulin aspart protamine- aspart (NOVOLOG MIX 70/30) (70-30) 100 UNIT/ML injection Inject 15 Units into the skin 2 (two) times daily with a meal.   Yes [provider]  polyethylene glycol (MIRALAX / GLYCOLAX) 17 g packet Take 17 g by mouth daily.  Mix one tablespoon with 8oz of your favorite juice or water every day until you are having soft formed stools. Then start taking once daily if you didn't have a stool the day before. 04/02/20  Yes Merlyn Lot, MD  SYNTHROID 50 MCG tablet Take 1 tablet by mouth daily. 04/21/18  Yes [provider]  TRESIBA FLEXTOUCH 100 UNIT/ML SOPN FlexTouch Pen Inject 80 Units into the skin at bedtime.  04/21/18  Yes [provider]  bisacodyl (DULCOLAX) 10 MG suppository Place 1 suppository (10 mg total) rectally as needed for moderate constipation. 04/02/20    Merlyn Lot, MD   US ARTERIAL ABI (SCREENING LOWER EXTREMITY)  Result Date: 05/18/2020 CLINICAL DATA:  75 year old female with history of diabetic foot infection. EXAM: NONINVASIVE PHYSIOLOGIC VASCULAR STUDY OF BILATERAL LOWER EXTREMITIES TECHNIQUE: Evaluation of both lower extremities were performed at rest, including calculation of ankle-brachial indices with single level Doppler, pressure and pulse volume recording. COMPARISON:  None. FINDINGS: Right ABI:  Unable to calculate due to noncompressible vessels. Left ABI:  Unable to calculate due to noncompressible vessels. Right Lower Extremity:  Normal arterial waveforms at the ankle. Left Lower Extremity:  Normal arterial waveforms at the ankle. 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 Normal arterial waveforms at the level of the ankle. If there is clinical concern for significant lower extremity peripheral artery disease, recommend CTA, MRA, or direct angiography for further evaluation. Ruthann Cancer, MD [facility] Radiology Electronically Signed   By: Ruthann Cancer MD   On: 05/18/2020 10:19   DG Foot Complete Right  Result Date: 05/17/2020 CLINICAL DATA:  Pain and swelling in the second toe with concern for osteomyelitis EXAM: RIGHT FOOT COMPLETE - 3+ VIEW COMPARISON:  None. FINDINGS: Chronic appearing severe deformity throughout the hindfoot with apparent ankylosis of the calcaneus and tarsal bones. Ankylosis of the first MTP joint. Degenerative changes throughout the tarsal metatarsal joints. No appreciable acute osseous erosions or periosteal reaction. No appreciable soft tissue gas. Soft tissue swelling throughout the right foot. No radiopaque foreign bodies. Vascular calcifications in the soft tissues. IMPRESSION: No specific radiographic findings of acute osteomyelitis. Diffuse soft tissue swelling. Chronic appearing  severe deformity throughout the hindfoot with apparent ankylosis of the calcaneus and tarsal bones. Ankylosis of the first MTP joint. Degenerative changes throughout the tarsometatarsal joints. Findings suggests chronic neuropathic change. Electronically Signed   By: Ilona Sorrel M.D.   On: 05/17/2020 15:02    Positive ROS: All other systems have been reviewed and were otherwise negative with the exception of those mentioned in the HPI and as above.  12 point ROS was performed.  Physical Exam: General: Alert and oriented.  No apparent distress.  Vascular:  Left foot:Dorsalis Pedis:  absent Posterior Tibial:  absent  Right foot: Dorsalis Pedis:  absent Posterior Tibial:  absent  Neuro:absent protective sensation  Derm: Obvious full-thickness ulcer to the right second toe with exposed joint and cartilage of the PIPJ.  Noted erythema to the dorsal foot extending to the metatarsophalangeal joint region.  Ortho/MS: Diffuse edema to the right foot.  Assessment: Full-thickness ulceration with exposed bone and joint second toe Peripheral vascular disease Diabetic neuropathy  Plan: She will need eventual amputation of this right second toe.  She is undergoing revascularization tomorrow.  Can possibly plan on surgery Saturday or Sunday after revascularization has been performed on Friday.  Dressing applied today.  I discussed this case with the patient as well  as the patient's son.  She seemed somewhat resistant to the amputation but I discussed this is a salvage procedure to prevent further infection into the foot.  We will follow-up tomorrow.  Pressure reducing boots were ordered today.    Elesa Hacker, DPM Cell [phone]   05/18/2020 1:48 PM

## 2020-05-18 NOTE — Consult Note (Signed)
[facility] Consult Note  MRN : [id number]  [name] [name] is a 75 y.o. ([birth year]) female who presents with chief complaint of  Chief Complaint  Patient presents with  . Foot Pain   History of Present Illness:  [name] [name] is a 75 y.o. female with medical history significant of DM, HTN, HLD, CKD-IV, hypothyroidism, gout, who presents with  foot ulcer with infection in right second toe.   Pt states that she noted a ulcer in dorsal aspects of right secondary toe about 1 week ago, which has been progressively worsening.  She noted little pus drainage.  She denies pain, fever or chills.  She was seen by Dr. Cleda Mccreedy of podiatry in office today. EDP, Dr. Joni Fears spoke with Dr. Cleda Mccreedy by phone. Per Dr. Cleda Mccreedy, pt likely needs right second toe amputation, but perfusion of the right foot seems marginal, and he is worried about ability to heal afteramputation. He recommends hospitalization for antibiotic coverage, vascular consult to prepare her for podiatry intervention. Patient also has a noninfected ulceration into subcutaneous fat on the right heel.  Patient does not have chest pain, cough, shortness breath.  Patient had nausea and vomited once earlier, which has resolved.  Currently patient does not have nausea vomiting, diarrhea or abdominal pain.  No symptoms of UTI.  Patient did have an appointment with Dr. Delana Meyer today in regard to her possible peripheral artery disease however was sent to the emergency room yesterday for treatment.  Vascular surgery was asked to consult by Dr. Blaine Hamper for possible endovascular intervention.  Current Facility-Administered Medications  Medication Dose Route Frequency Provider Last Rate Last Admin  . acetaminophen (TYLENOL) tablet 650 mg  650 mg Oral Q6H PRN Ivor Costa, MD      . amLODipine (NORVASC) tablet 10 mg  10 mg Oral Daily Ivor Costa, MD   10 mg at 05/18/20 0909  . Ampicillin-Sulbactam (UNASYN) 3 g in sodium chloride 0.9 %  100 mL IVPB  3 g Intravenous Q8H Fritzi Mandes, MD      . aspirin chewable tablet 81 mg  81 mg Oral Daily Ivor Costa, MD   81 mg at 05/18/20 0906  . atorvastatin (LIPITOR) tablet 40 mg  40 mg Oral Daily Ivor Costa, MD   40 mg at 05/18/20 0909  . bisacodyl (DULCOLAX) suppository 10 mg  10 mg Rectal PRN Ivor Costa, MD      . carvedilol (COREG) tablet 25 mg  25 mg Oral BID Ivor Costa, MD   25 mg at 05/18/20 0909  . donepezil (ARICEPT) tablet 10 mg  10 mg Oral QHS Ivor Costa, MD   10 mg at 05/17/20 2245  . heparin injection 5,000 Units  5,000 Units Subcutaneous Q8H Ivor Costa, MD   5,000 Units at 05/17/20 2245  . hydrALAZINE (APRESOLINE) injection 5 mg  5 mg Intravenous Q2H PRN Ivor Costa, MD      . insulin aspart (novoLOG) injection 0-5 Units  0-5 Units Subcutaneous QHS Ivor Costa, MD      . insulin aspart (novoLOG) injection 0-9 Units  0-9 Units Subcutaneous TID WC Ivor Costa, MD   1 Units at 05/18/20 1226  . insulin glargine (LANTUS) injection 30 Units  30 Units Subcutaneous Q2000 Ivor Costa, MD   30 Units at 05/17/20 2310  . levothyroxine (SYNTHROID) tablet 50 mcg  50 mcg Oral Q0600 Ivor Costa, MD      . ondansetron [facility]) injection 4 mg  4 mg Intravenous Q8H PRN  Ivor Costa, MD       Past Medical History:  Diagnosis Date  . Diabetes mellitus without complication ([place])   . Humerus fracture 04/20/2018   left  . Hyperlipidemia   . Hypertension   . Hypothyroidism   . Vertigo    several yrs ago  . Wears dentures    partial upper and lower (loose)   Past Surgical History:  Procedure Laterality Date  . CATARACT EXTRACTION W/PHACO Left 12/01/2018   Procedure: CATARACT EXTRACTION PHACO AND INTRAOCULAR LENS PLACEMENT ([place]) LEFT DIABETIC;  Surgeon: Birder Robson, MD;  Location: [facility];  Service: Ophthalmology;  Laterality: Left;  Diabetic - insulin  . COLONOSCOPY WITH PROPOFOL N/A 07/07/2019   Procedure: COLONOSCOPY WITH PROPOFOL;  Surgeon: Robert Bellow, MD;  Location: [facility]  ENDOSCOPY;  Service: Endoscopy;  Laterality: N/A;  . ESOPHAGOGASTRODUODENOSCOPY (EGD) WITH PROPOFOL N/A 07/07/2019   Procedure: ESOPHAGOGASTRODUODENOSCOPY (EGD) WITH PROPOFOL;  Surgeon: Robert Bellow, MD;  Location: [facility] ENDOSCOPY;  Service: Endoscopy;  Laterality: N/A;  . FOOT SURGERY Right    lesion excision   Social History Social History   Tobacco Use  . Smoking status: Never Smoker  . Smokeless tobacco: Never Used  Vaping Use  . Vaping Use: Never used  Substance Use Topics  . Alcohol use: No  . Drug use: Never   Family History Family History  Problem Relation Age of Onset  . Breast [name] Neg Hx   Denies family history of peripheral artery disease, venous disease or renal disease.  No Known Allergies  REVIEW OF SYSTEMS (Negative unless checked)  Constitutional: [] Weight loss  [] Fever  [] Chills Cardiac: [] Chest pain   [] Chest pressure   [] Palpitations   [] Shortness of breath when laying flat   [] Shortness of breath at rest   [] Shortness of breath with exertion. Vascular:  [] Pain in legs with walking   [] Pain in legs at rest   [] Pain in legs when laying flat   [] Claudication   [x] Pain in feet when walking  [x] Pain in feet at rest  [x] Pain in feet when laying flat   [] History of DVT   [] Phlebitis   [] Swelling in legs   [] Varicose veins   [x] Non-healing ulcers Pulmonary:   [] Uses home oxygen   [] Productive cough   [] Hemoptysis   [] Wheeze  [] COPD   [] Asthma Neurologic:  [] Dizziness  [] Blackouts   [] Seizures   [] History of stroke   [] History of TIA  [] Aphasia   [] Temporary blindness   [] Dysphagia   [] Weakness or numbness in arms   [] Weakness or numbness in legs Musculoskeletal:  [] Arthritis   [] Joint swelling   [] Joint pain   [] Low back pain Hematologic:  [] Easy bruising  [] Easy bleeding   [] Hypercoagulable state   [] Anemic  [] Hepatitis Gastrointestinal:  [] Blood in stool   [] Vomiting blood  [] Gastroesophageal reflux/heartburn   [] Difficulty swallowing. Genitourinary:   [] Chronic kidney disease   [] Difficult urination  [] Frequent urination  [] Burning with urination   [] Blood in urine Skin:  [] Rashes   [x] Ulcers   [x] Wounds Psychological:  [] History of anxiety   []  History of major depression.  Physical Examination  Vitals:   05/18/20 0046 05/18/20 0400 05/18/20 0755 05/18/20 1212  BP: (!) 138/56 135/64 139/63 (!) 105/55  Pulse: 67 66 68 71  Resp:  18 17 17   Temp:  99.2 F (37.3 C) 98.6 F (37 C) 98.3 F (36.8 C)  TempSrc:  Oral    SpO2: 100% 100% 97% 98%  Weight:  Height:       Body mass index is 31.08 kg/m. Gen:  WD/WN, NAD Head: [place]/AT, No temporalis wasting. Prominent temp pulse not noted. Ear/Nose/Throat: Hearing grossly intact, nares w/o erythema or drainage, oropharynx w/o Erythema/Exudate Eyes: Sclera non-icteric, conjunctiva clear Neck: Trachea midline.  No JVD.  Pulmonary:  Good air movement, respirations not labored, equal bilaterally.  Cardiac: RRR, normal S1, S2. Vascular:  Vessel Right Left  Radial Palpable Palpable  Ulnar Palpable Palpable  Brachial Palpable Palpable  Carotid Palpable, without bruit Palpable, without bruit  Aorta Not palpable N/A  Femoral Palpable Palpable  Popliteal Palpable Palpable  PT Non-Palpable Non-Palpable  DP Non-Palpable Non-Palpable   Right foot: Ulcer noted to the right second toe.  Gastrointestinal: soft, non-tender/non-distended. No guarding/reflex.  Musculoskeletal: M/S 5/5 throughout.  Extremities without ischemic changes.  No deformity or atrophy. No edema. Neurologic: Sensation grossly intact in extremities.  Symmetrical.  Speech is fluent. Motor exam as listed above. Psychiatric: Judgment intact, Mood & affect appropriate for pt's clinical situation. Dermatologic: As above Lymph : No Cervical, Axillary, or Inguinal lymphadenopathy.  CBC Lab Results  Component Value Date   WBC 7.7 05/18/2020   HGB 9.4 (L) 05/18/2020   HCT 29.2 (L) 05/18/2020   MCV 93.0 05/18/2020   PLT 215  05/18/2020   BMET    Component Value Date/Time   NA 136 05/18/2020 0541   NA 142 04/17/2014 1131   K 3.9 05/18/2020 0541   K 4.0 04/17/2014 1131   CL 103 05/18/2020 0541   CL 109 (H) 04/17/2014 1131   CO2 25 05/18/2020 0541   CO2 24 04/17/2014 1131   GLUCOSE 118 (H) 05/18/2020 0541   GLUCOSE 89 04/17/2014 1131   BUN 36 (H) 05/18/2020 0541   BUN 22 (H) 04/17/2014 1131   CREATININE 1.96 (H) 05/18/2020 0541   CREATININE 1.78 (H) 04/17/2014 1131   CALCIUM 8.7 (L) 05/18/2020 0541   CALCIUM 8.8 04/17/2014 1131   GFRNONAA 27 (L) 05/18/2020 0541   GFRNONAA 30 (L) 04/17/2014 1131   GFRNONAA 37 (L) 04/22/2013 0946   GFRAA 29 (L) 12/07/2019 0645   GFRAA 37 (L) 04/17/2014 1131   GFRAA 42 (L) 04/22/2013 0946   Estimated Creatinine Clearance: 29.5 mL/min (A) (by C-G formula based on SCr of 1.96 mg/dL (H)).  COAG Lab Results  Component Value Date   INR 1.2 10/11/2019   INR 1.2 10/10/2019   Radiology US ARTERIAL ABI (SCREENING LOWER EXTREMITY)  Result Date: 05/18/2020 CLINICAL DATA:  75 year old female with history of diabetic foot infection. EXAM: NONINVASIVE PHYSIOLOGIC VASCULAR STUDY OF BILATERAL LOWER EXTREMITIES TECHNIQUE: Evaluation of both lower extremities were performed at rest, including calculation of ankle-brachial indices with single level Doppler, pressure and pulse volume recording. COMPARISON:  None. FINDINGS: Right ABI:  Unable to calculate due to noncompressible vessels. Left ABI:  Unable to calculate due to noncompressible vessels. Right Lower Extremity:  Normal arterial waveforms at the ankle. Left Lower Extremity:  Normal arterial waveforms at the ankle. 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 Normal arterial waveforms at the level of the ankle. If there is clinical concern for significant lower extremity peripheral artery disease, recommend CTA, MRA, or direct angiography for further  evaluation. [name] Cancer, MD [facility] Electronically Signed   By: [name] Cancer MD   On: 05/18/2020 10:19   DG Foot Complete Right  Result Date: 05/17/2020 CLINICAL DATA:  Pain and swelling in the second  toe with concern for osteomyelitis EXAM: RIGHT FOOT COMPLETE - 3+ VIEW COMPARISON:  None. FINDINGS: Chronic appearing severe deformity throughout the hindfoot with apparent ankylosis of the calcaneus and tarsal bones. Ankylosis of the first MTP joint. Degenerative changes throughout the tarsal metatarsal joints. No appreciable acute osseous erosions or periosteal reaction. No appreciable soft tissue gas. Soft tissue swelling throughout the right foot. No radiopaque foreign bodies. Vascular calcifications in the soft tissues. IMPRESSION: No specific radiographic findings of acute osteomyelitis. Diffuse soft tissue swelling. Chronic appearing severe deformity throughout the hindfoot with apparent ankylosis of the calcaneus and tarsal bones. Ankylosis of the first MTP joint. Degenerative changes throughout the tarsometatarsal joints. Findings suggests chronic neuropathic change. Electronically Signed   By: Ilona Sorrel M.D.   On: 05/17/2020 15:02   Assessment/Plan SHAINA GULLATT is a 75 y.o. female with medical history significant of DM, HTN, HLD, CKD-IV, hypothyroidism, gout, who presents with  foot ulcer with infection in right second toe.   1.  Right foot wound: Patient with aggressively worsening right foot wound.  Initially, the patient had an an outpatient appointment with Dr. Delana Meyer today in regard to her peripheral artery disease however was admitted through the ED last night.  In the setting of chronic wound with multiple risk factors for atherosclerotic disease recommend undergoing a right lower extremity angiogram possible intervention and attempt assess the patient's anatomy and contributing degree of atherosclerotic disease.  If  appropriate, an attempt to revascularize the leg can be made at that time.  Procedure, risks and benefits were sent to the patient and her son who are at the bedside.  All questions were answered.  The patient wishes to proceed.  2.  Diabetic: On appropriate medications Encouraged good control as its slows the progression of atherosclerotic disease.   3.  Hyperlipidemia: On aspirin and statin for medical management Encouraged good control as its slows the progression of atherosclerotic disease.   Discussed with Dr. Francene Castle, PA-C  05/18/2020 1:56 PM  This note was created with Dragon medical transcription system.  Any error is purely unintentional

## 2020-05-19 ENCOUNTER — Encounter
Admission: EM | Disposition: A | Discharge status: Home Health | Payer: Self-pay | Source: Home / Self Care | Attending: Internal Medicine

## 2020-05-19 DIAGNOSIS — L089 Local infection of the skin and subcutaneous tissue, unspecified: Secondary | ICD-10-CM | POA: Diagnosis not present

## 2020-05-19 DIAGNOSIS — E11628 Type 2 diabetes mellitus with other skin complications: Secondary | ICD-10-CM | POA: Diagnosis not present

## 2020-05-19 DIAGNOSIS — I70234 Atherosclerosis of native arteries of right leg with ulceration of heel and midfoot: Secondary | ICD-10-CM

## 2020-05-19 HISTORY — PX: LOWER EXTREMITY ANGIOGRAPHY: CATH118251

## 2020-05-19 LAB — BASIC METABOLIC PANEL
Anion gap: 13 (ref 5–15)
BUN: 37 mg/dL — ABNORMAL HIGH (ref 8–23)
CO2: 22 mmol/L (ref 22–32)
Calcium: 8.6 mg/dL — ABNORMAL LOW (ref 8.9–10.3)
Chloride: 104 mmol/L (ref 98–111)
Creatinine, Ser: 1.98 mg/dL — ABNORMAL HIGH (ref 0.44–1.00)
GFR, Estimated: 26 mL/min — ABNORMAL LOW (ref >60)
Glucose, Bld: 102 mg/dL — ABNORMAL HIGH (ref 70–99)
Potassium: 3.9 mmol/L (ref 3.5–5.1)
Sodium: 139 mmol/L (ref 135–145)

## 2020-05-19 LAB — GLUCOSE, CAPILLARY
Glucose-Capillary: 99 mg/dL (ref 70–99)
Glucose-Capillary: 172 mg/dL — ABNORMAL HIGH (ref 70–99)
Glucose-Capillary: 155 mg/dL — ABNORMAL HIGH (ref 70–99)

## 2020-05-19 SURGERY — LOWER EXTREMITY ANGIOGRAPHY
Anesthesia: Moderate Sedation | Laterality: Right

## 2020-05-19 MED ORDER — METHYLPREDNISOLONE SODIUM SUCC 125 MG IJ SOLR: 125.0000 mg | Freq: Once | INTRAMUSCULAR | Status: DC | PRN

## 2020-05-19 MED ORDER — IODIXANOL 320 MG/ML IV SOLN
INTRAVENOUS | Status: DC | PRN
  Administered 2020-05-19: 70 mL

## 2020-05-19 MED ORDER — SODIUM CHLORIDE 0.9% FLUSH
3.0000 mL | INTRAVENOUS | Status: DC | PRN
  Administered 2020-05-24: 3 mL via INTRAVENOUS

## 2020-05-19 MED ORDER — MIDAZOLAM HCL 5 MG/5ML IJ SOLN
INTRAMUSCULAR | Status: AC
  Administered 2020-05-19: 2 mg
  Filled 2020-05-19: qty 5

## 2020-05-19 MED ORDER — HEPARIN SODIUM (PORCINE) 1000 UNIT/ML IJ SOLN
INTRAMUSCULAR | Status: AC
  Administered 2020-05-19: 1000 [IU]
  Filled 2020-05-19: qty 1

## 2020-05-19 MED ORDER — FAMOTIDINE 20 MG PO TABS: 40.0000 mg | ORAL_TABLET | Freq: Once | ORAL | Status: DC | PRN

## 2020-05-19 MED ORDER — CEFAZOLIN SODIUM-DEXTROSE 1-4 GM/50ML-% IV SOLN
INTRAVENOUS | Status: AC
  Filled 2020-05-19: qty 50

## 2020-05-19 MED ORDER — ONDANSETRON HCL 4 MG/2ML IJ SOLN: 4.0000 mg | Freq: Four times a day (QID) | INTRAMUSCULAR | Status: DC | PRN

## 2020-05-19 MED ORDER — MIDAZOLAM HCL 2 MG/2ML IJ SOLN
INTRAMUSCULAR | Status: DC | PRN
  Administered 2020-05-19 (×2): 0.5 mg via INTRAVENOUS

## 2020-05-19 MED ORDER — FENTANYL CITRATE (PF) 100 MCG/2ML IJ SOLN
INTRAMUSCULAR | Status: DC | PRN
  Administered 2020-05-19 (×2): 25 ug via INTRAVENOUS

## 2020-05-19 MED ORDER — SODIUM CHLORIDE 0.9 % IV SOLN
250.0000 mL | INTRAVENOUS | Status: DC | PRN
  Administered 2020-05-21: 250 mL via INTRAVENOUS

## 2020-05-19 MED ORDER — HYDROMORPHONE HCL 1 MG/ML IJ SOLN: 1.0000 mg | Freq: Once | INTRAMUSCULAR | Status: DC | PRN

## 2020-05-19 MED ORDER — DIPHENHYDRAMINE HCL 50 MG/ML IJ SOLN: 50.0000 mg | Freq: Once | INTRAMUSCULAR | Status: DC | PRN

## 2020-05-19 MED ORDER — FENTANYL CITRATE (PF) 100 MCG/2ML IJ SOLN
INTRAMUSCULAR | Status: AC
  Administered 2020-05-19: 25 ug
  Filled 2020-05-19: qty 2

## 2020-05-19 MED ORDER — HEPARIN SODIUM (PORCINE) 1000 UNIT/ML IJ SOLN
INTRAMUSCULAR | Status: DC | PRN
  Administered 2020-05-19: 4000 [IU] via INTRAVENOUS

## 2020-05-19 MED ORDER — CEFAZOLIN SODIUM-DEXTROSE 1-4 GM/50ML-% IV SOLN
1.0000 g | Freq: Once | INTRAVENOUS | Status: AC
  Administered 2020-05-19: 1 g via INTRAVENOUS

## 2020-05-19 MED ORDER — SODIUM CHLORIDE 0.9 % IV SOLN: INTRAVENOUS | Status: DC

## 2020-05-19 MED ORDER — SODIUM CHLORIDE 0.9 % IV SOLN: INTRAVENOUS | Status: AC

## 2020-05-19 MED ORDER — MIDAZOLAM HCL 2 MG/ML PO SYRP: 8.0000 mg | ORAL_SOLUTION | Freq: Once | ORAL | Status: DC | PRN

## 2020-05-19 MED ORDER — SODIUM CHLORIDE 0.9% FLUSH
3.0000 mL | Freq: Two times a day (BID) | INTRAVENOUS | Status: DC
  Administered 2020-05-20 – 2020-06-04 (×27): 3 mL via INTRAVENOUS

## 2020-05-19 MED ORDER — CEFAZOLIN SODIUM-DEXTROSE 1-4 GM/50ML-% IV SOLN
1.0000 g | Freq: Once | INTRAVENOUS | Status: DC
  Filled 2020-05-19: qty 50

## 2020-05-19 SURGICAL SUPPLY — 22 items
BALLN ULTRVRSE 2.5X40X150 (BALLOONS) ×2
BALLOON ULTRVRSE 150X40X2.5 (BALLOONS) ×1 IMPLANT
CATH ANGIO 5F PIGTAIL 65CM (CATHETERS) ×2 IMPLANT
CATH CXI SUPP 2.6F 150 ANG (CATHETERS) ×2 IMPLANT
CATH NAVICROSS ANGLED 135CM (MICROCATHETER) ×2 IMPLANT
CATH SIDEKICK XL ST 110CM (SHEATH) ×2 IMPLANT
CATH VERT 5FR 125CM (CATHETERS) ×2 IMPLANT
DEVICE STARCLOSE SE CLOSURE (Vascular Products) ×2 IMPLANT
DEVICE TORQUE (MISCELLANEOUS) ×2 IMPLANT
GLIDEWIRE ADV .035X260CM (WIRE) ×2 IMPLANT
GLIDEWIRE ANGLED SS 035X260CM (WIRE) ×4 IMPLANT
GUIDEWIRE PFTE-COATED .018X300 (WIRE) ×4 IMPLANT
KIT ENCORE 26 ADVANTAGE (KITS) ×2 IMPLANT
NEEDLE ENTRY 21GA 7CM ECHOTIP (NEEDLE) ×2 IMPLANT
PACK ANGIOGRAPHY (CUSTOM PROCEDURE TRAY) ×2 IMPLANT
SET INTRO CAPELLA COAXIAL (SET/KITS/TRAYS/PACK) ×2 IMPLANT
SHEATH BRITE TIP 5FRX11 (SHEATH) ×2 IMPLANT
SHEATH RAABE 7FR (SHEATH) ×2 IMPLANT
SYR MEDRAD MARK 7 150ML (SYRINGE) ×2 IMPLANT
TUBING CONTRAST HIGH PRESS 72 (TUBING) ×2 IMPLANT
WIRE GUIDERIGHT .035X150 (WIRE) ×2 IMPLANT
WIRE RUNTHROUGH .014X300CM (WIRE) ×2 IMPLANT

## 2020-05-19 NOTE — Op Note (Signed)
[facility]  Percutaneous Study/Intervention Procedural Note   Date of Surgery: 05/19/2020,1:49 PM  Surgeon:Tru Leopard, Dolores Lory   Pre-operative Diagnosis: Atherosclerotic occlusive disease bilateral lower extremities with ulceration of the right foot  Post-operative diagnosis:  Same  Procedure(s) Performed:  1.  Abdominal aortogram  2.  Right lower extremity angiography third order catheter placement  3.  Star close left common femoral artery   Anesthesia: Conscious sedation was administered by the interventional radiology RN under my direct supervision. IV Versed plus fentanyl were utilized. Continuous ECG, pulse oximetry and blood pressure was monitored throughout the entire procedure.  Conscious sedation was administered for a total of 1 hour 12 minutes 50 seconds.  Sheath: 7 [place] sheath left common femoral retrograde  Contrast: 70 cc   Fluoroscopy Time: 21.2 minutes  Indications:  The patient presents to [facility] with gangrenous changes to the right forefoot.  Pedal pulses are nonpalpable bilaterally suggesting atherosclerotic occlusive disease.  The risks and benefits as well as alternative therapies for lower extremity revascularization are reviewed with the patient all questions are answered the patient agrees to proceed.  The patient is therefore undergoing angiography with the hope for intervention for limb salvage.   Procedure:  ZAILEY AUDIA a 75 y.o. female who was identified and appropriate procedural time out was performed.  The patient was then placed supine on the table and prepped and draped in the usual sterile fashion.  Ultrasound was used to evaluate the left common femoral artery.  It was echolucent and pulsatile indicating it is patent .  An ultrasound image was acquired for the permanent record.  A micropuncture needle was used to access the left common femoral artery under direct ultrasound guidance.  The microwire was then  advanced under fluoroscopic guidance without difficulty followed by the micro-sheath.  A 0.035 J wire was advanced without resistance and a 5Fr sheath was placed.    Pigtail catheter was then advanced to the level of T12 and AP projection of the aorta was obtained. Pigtail catheter was then repositioned to above the bifurcation and LAO view of the pelvis was obtained. Stiff angled Glidewire and pigtail catheter was then used across the bifurcation and the catheter was positioned in the distal external iliac artery.  RAO of the right groin was then obtained. Wire was reintroduced and negotiated into the SFA and the catheter was advanced into the SFA. Distal runoff was then performed.  Diagnostic interpretation: The abdominal aorta is opacified with a bolus injection contrast.  There is mild atherosclerotic occlusive disease noted diffusely there are no hemodynamically significant stenoses.  Single renal arteries are noted bilaterally there is approximately 25 to 30% calcific plaque noted at the ostia of each renal artery.  This is not hemodynamically significant.  The aortic bifurcation is widely patent and the bilateral common internal and external iliac arteries are mildly calcified but there are no hemodynamically significant stenosis.  The right common femoral profunda femoris superficial femoral and above-knee popliteal are diffusely calcified but there are no hemodynamically significant lesions.  In the distal popliteal there is a 70% stenosis just below the level of the tibial plateau.  The trifurcation is profoundly diseased with a subtotal occlusion at the origin of the anterior tibial the tibioperoneal trunk is profoundly diseased throughout its course and there is occlusion of the peroneal as well as the posterior tibial throughout their entire length.  Beyond the disease near the origin of the anterior tibial there is an  occlusion over approximately 4 to 6 cm.  There is reconstitution of the  anterior tibial and subsequently a greater than 80% focal stenosis in its midportion.  Beyond this third lesion the anterior tibial is widely patent and clearly dominant runoff to the foot.  Attempts at crossing the anterior tibial artery occlusion were unsuccessful.  After review of the images the catheter was removed over wire and an LAO view of the groin was obtained. StarClose device was deployed without difficulty.  Given the patient's ulceration and cellulitis of the right forefoot revascularization is imperative.  I will plan to return her to the angios suite with attempts at retrograde pedal access in association with approach from the left groin as needed.  If this is not successful then a popliteal to distal anterior tibial artery bypass would be indicated.  Disposition: Patient was taken to the recovery room in stable condition having tolerated the procedure well.  Belenda Cruise Cindia Hustead 05/19/2020,1:49 PM

## 2020-05-19 NOTE — Care Management Important Message (Signed)
Important Message  Patient Details  Name: [name] [name] MRN: [id number] Date of Birth: Jan 17, 1946   Medicare Important Message Given:  N/A - LOS <3 / Initial given by admissions  Initial Medicare IM reviewed with son, Trevor Duty by Meredith Mody, Patient Access Associate on 05/18/2020 at 9:39am.   Dannette Barbara 05/19/2020, 8:29 AM

## 2020-05-19 NOTE — Progress Notes (Signed)
[name] [name] at [place] NAME: [name] [name]    MR#:  [id number]  DATE OF BIRTH:  07-02-1945  SUBJECTIVE:  patient came in with right second to foot ulcer. She was seen in the podiatry clinic by Dr. Cleda Mccreedy who send her to the ER for further evaluation. Patient denies any pain.   She is scheduled for angiogram today. REVIEW OF SYSTEMS:   Review of Systems  Constitutional: Negative for chills, fever and weight loss.  HENT: Negative for ear discharge, ear pain and nosebleeds.   Eyes: Negative for blurred vision, pain and discharge.  Respiratory: Negative for sputum production, shortness of breath, wheezing and stridor.   Cardiovascular: Negative for chest pain, palpitations, orthopnea and PND.  Gastrointestinal: Negative for abdominal pain, diarrhea, nausea and vomiting.  Genitourinary: Negative for frequency and urgency.  Musculoskeletal: Negative for back pain and joint pain.  Neurological: Positive for weakness. Negative for sensory change, speech change and focal weakness.  Psychiatric/Behavioral: Negative for depression and hallucinations. The patient is not nervous/anxious.    Tolerating Diet: yes Tolerating PT: patient is bedbound at baseline  DRUG ALLERGIES:  No Known Allergies  VITALS:  Blood pressure (!) 143/44, pulse 87, temperature 98.3 F (36.8 C), resp. rate 17, height 5\' 7"  (1.702 m), weight 90 kg, SpO2 95 %.  PHYSICAL EXAMINATION:   Physical Exam  GENERAL:  75 y.o.-year-old patient lying in the bed with no acute distress. Obese LUNGS: Normal breath sounds bilaterally, no wheezing, rales, rhonchi. No use of accessory muscles of respiration.  CARDIOVASCULAR: S1, S2 normal. No murmurs, rubs, or gallops.  ABDOMEN: Soft, nontender, nondistended. Bowel sounds present. No organomegaly or mass.  EXTREMITIES:    NEUROLOGIC: Cranial nerves II through XII are intact. No focal Motor or sensory deficits b/l.   PSYCHIATRIC:  patient  is alert and oriented x 3.  SKIN:  Pressure Injury 11/16/19 Sacrum Posterior (Active)  11/16/19 1544  Location: Sacrum  Location Orientation: Posterior  Staging:   Wound Description (Comments):   Present on Admission: Yes     Pressure Injury 12/04/19 Ankle Anterior;Right Unstageable - Full thickness tissue loss in which the base of the injury is covered by slough (yellow, tan, gray, green or [place]) and/or eschar (tan, [place] or black) in the wound bed. (Active)  12/04/19 1837  Location: Ankle  Location Orientation: Anterior;Right  Staging: Unstageable - Full thickness tissue loss in which the base of the injury is covered by slough (yellow, tan, gray, green or [place]) and/or eschar (tan, [place] or black) in the wound bed.  Wound Description (Comments):   Present on Admission:          LABORATORY PANEL:  CBC Recent Labs  Lab 05/18/20 0541  WBC 7.7  HGB 9.4*  HCT 29.2*  PLT 215    Chemistries  Recent Labs  Lab 05/17/20 1204 05/18/20 0541 05/19/20 0524  NA 139   < > 139  K 4.3   < > 3.9  CL 105   < > 104  CO2 22   < > 22  GLUCOSE 193*   < > 102*  BUN 32*   < > 37*  CREATININE 1.88*   < > 1.98*  CALCIUM 8.9   < > 8.6*  AST 18  --   --   ALT 11  --   --   ALKPHOS 77  --   --   BILITOT 0.5  --   --    < > =  values in this interval not displayed.   Cardiac Enzymes No results for input(s): TROPONINI in the last 168 hours. RADIOLOGY:  US ARTERIAL ABI (SCREENING LOWER EXTREMITY)  Result Date: 05/18/2020 CLINICAL DATA:  75 year old female with history of diabetic foot infection. EXAM: NONINVASIVE PHYSIOLOGIC VASCULAR STUDY OF BILATERAL LOWER EXTREMITIES TECHNIQUE: Evaluation of both lower extremities were performed at rest, including calculation of ankle-brachial indices with single level Doppler, pressure and pulse volume recording. COMPARISON:  None. FINDINGS: Right ABI:  Unable to calculate due to noncompressible vessels. Left ABI:  Unable to calculate due to  noncompressible vessels. Right Lower Extremity:  Normal arterial waveforms at the ankle. Left Lower Extremity:  Normal arterial waveforms at the ankle. 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 Normal arterial waveforms at the level of the ankle. If there is clinical concern for significant lower extremity peripheral artery disease, recommend CTA, MRA, or direct angiography for further evaluation. Ruthann Cancer, MD [facility] Electronically Signed   By: Ruthann Cancer MD   On: 05/18/2020 10:19   DG Foot Complete Right  Result Date: 05/17/2020 CLINICAL DATA:  Pain and swelling in the second toe with concern for osteomyelitis EXAM: RIGHT FOOT COMPLETE - 3+ VIEW COMPARISON:  None. FINDINGS: Chronic appearing severe deformity throughout the hindfoot with apparent ankylosis of the calcaneus and tarsal bones. Ankylosis of the first MTP joint. Degenerative changes throughout the tarsal metatarsal joints. No appreciable acute osseous erosions or periosteal reaction. No appreciable soft tissue gas. Soft tissue swelling throughout the right foot. No radiopaque foreign bodies. Vascular calcifications in the soft tissues. IMPRESSION: No specific radiographic findings of acute osteomyelitis. Diffuse soft tissue swelling. Chronic appearing severe deformity throughout the hindfoot with apparent ankylosis of the calcaneus and tarsal bones. Ankylosis of the first MTP joint. Degenerative changes throughout the tarsometatarsal joints. Findings suggests chronic neuropathic change. Electronically Signed   By: [name] [name] M.D.   On: 05/17/2020 15:02   ASSESSMENT AND PLAN:  [name] is a 75 y.o. female with medical history significant of DM, HTN, HLD, CKD-IV, hypothyroidism, gout, who presents with  foot ulcer with infection in right second toe.  Pt states that she noted a ulcer in dorsal  aspects of right secondary toe about 1 week ago, which has been progressively worsening.  She noted little pus drainage.  Diabetic foot ulcer with infection_right second toe: Patient does not have fever or leukocytosis.  Clinically not septic.  Hemodynamically stable. Dr. Cleda Mccreedy of podiatry, Dr. Delana Meyer of [facility] are consulted. 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 Normal arterial waveforms at the level of the ankle. --Patient seen by vascular surgery Dr. Delana Meyer who plans angiogram today -- podiatry consult with Dr. Vickki Muff appreciated plans for debridement over the weekend.  HTN (hypertension) -IV hydralazine as needed -Amlodipine, Coreg,  Hypothyroidism (acquired) -Synthroid  Hyperlipidemia -Lipitor  CKD (chronic kidney disease), stage IV ([place]): Stable.  Baseline creatinine 1.6-1.9 recently. Her creatinine is 1.88, BUN 3  Type II diabetes mellitus with renal manifestations [facility]): Recent A1c 7.0, not well controlled. - Pt is taking tresiba and 70/30 insulin as needed -Sliding scale insulin -cont lantus 30 units daily   GERD (gastroesophageal reflux disease) -Protonix  At baseline patient has been bedbound for more than seven months. She is taken care of by son and other family members at  home.  DVT ppx: SQ Heparin   Code Status: Full code Family Communication:   Yes, patient's son   at bed side Disposition Plan:  Anticipate discharge back to previous environment Consults called:   Dr. Cleda Mccreedy of podiatry, Dr. Delana Meyer of [facility]   Status is: Inpatient  Remains inpatient appropriate because:Inpatient level of care appropriate due to severity of illness.  Patient has multiple comorbidities, now presents with diabetic foot ulcer with infection right second toe, also has poor circulation.  Patient may need amputation of right  second toe and revascularization by [facility].Will need to be treated in hospital for at least 2 days.   Dispo: The patient is from: Home  Anticipated d/c is to: Home  Anticipated d/c date is:1- 2 days  Patient currently is not medically stable to d/c.              Difficult to place patient No patient getting lower extremity angiogram and will be getting debridement over the weekend.    Level of care: Med-Surg Status is: Inpatient     TOTAL TIME TAKING CARE OF THIS PATIENT: *25* minutes.  >50% time spent on counselling and coordination of care  Note: This dictation was prepared with Dragon dictation along with smaller phrase technology. Any transcriptional errors that result from this process are unintentional.  Fritzi Mandes M.D    [facility]   CC: Primary care physician; Marguerita Merles, MDPatient ID: [name], female   DOB: 05-21-1945, 75 y.o.   MRN: [id number]

## 2020-05-19 NOTE — Interval H&P Note (Signed)
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  The various methods of treatment have been discussed with the patient and family. After consideration of risks, benefits and other options for treatment, the patient has consented to  Procedure(s): Lower Extremity Angiography (Right) as a surgical intervention.  The patient's history has been reviewed, patient examined, no change in status, stable for surgery.  I have reviewed the patient's chart and labs.  Questions were answered to the patient's satisfaction.     [name]

## 2020-05-19 NOTE — Progress Notes (Signed)
Report given to [facility], pt taken back to room 146 tolerated well.

## 2020-05-19 NOTE — Plan of Care (Signed)

## 2020-05-20 DIAGNOSIS — E11628 Type 2 diabetes mellitus with other skin complications: Secondary | ICD-10-CM | POA: Diagnosis not present

## 2020-05-20 DIAGNOSIS — L089 Local infection of the skin and subcutaneous tissue, unspecified: Secondary | ICD-10-CM | POA: Diagnosis not present

## 2020-05-20 LAB — GLUCOSE, CAPILLARY
Glucose-Capillary: 91 mg/dL (ref 70–99)
Glucose-Capillary: 128 mg/dL — ABNORMAL HIGH (ref 70–99)
Glucose-Capillary: 90 mg/dL (ref 70–99)
Glucose-Capillary: 114 mg/dL — ABNORMAL HIGH (ref 70–99)

## 2020-05-20 NOTE — Progress Notes (Signed)
[facility] at [place] NAME: [name] [name]    MR#:  [id number]  DATE OF BIRTH:  08-06-1945  SUBJECTIVE:  patient came in with right second to foot ulcer. She was seen in the podiatry clinic by Dr. Cleda [name] who send her to the ER for further evaluation. Patient denies any pain.   Pt is scheduled for attempting another vascular procedure for revascularization next Tuesday. REVIEW OF SYSTEMS:   Review of Systems  Constitutional: Negative for chills, fever and weight loss.  HENT: Negative for ear discharge, ear pain and nosebleeds.   Eyes: Negative for blurred vision, pain and discharge.  Respiratory: Negative for sputum production, shortness of breath, wheezing and stridor.   Cardiovascular: Negative for chest pain, palpitations, orthopnea and PND.  Gastrointestinal: Negative for abdominal pain, diarrhea, nausea and vomiting.  Genitourinary: Negative for frequency and urgency.  Musculoskeletal: Negative for back pain and joint pain.  Neurological: Positive for weakness. Negative for sensory change, speech change and focal weakness.  Psychiatric/Behavioral: Negative for depression and hallucinations. The patient is not nervous/anxious.    Tolerating Diet: yes Tolerating PT: patient is bedbound at baseline  DRUG ALLERGIES:  No Known Allergies  VITALS:  Blood pressure (!) 120/94, pulse 73, temperature 98.9 F (37.2 [name]), temperature source Oral, resp. rate 16, height 5\' 7"  (1.702 m), weight 90 kg, SpO2 99 %.  PHYSICAL EXAMINATION:   Physical Exam  GENERAL:  75 y.o.-year-old patient lying in the bed with no acute distress. Obese LUNGS: Normal breath sounds bilaterally, no wheezing, rales, rhonchi. No use of accessory muscles of respiration.  CARDIOVASCULAR: S1, S2 normal. No murmurs, rubs, or gallops.  ABDOMEN: Soft, nontender, nondistended. Bowel sounds present. No organomegaly or mass.  EXTREMITIES:    NEUROLOGIC: Cranial nerves II through  XII are intact. No focal Motor or sensory deficits b/l.   PSYCHIATRIC:  patient is alert and oriented x 3.  SKIN:  Pressure Injury 11/16/19 Sacrum Posterior (Active)  11/16/19 1544  Location: Sacrum  Location Orientation: Posterior  Staging:   Wound Description (Comments):   Present on Admission: Yes     Pressure Injury 12/04/19 Ankle Anterior;Right Unstageable - Full thickness tissue loss in which the base of the injury is covered by slough (yellow, tan, gray, green or [place]) and/or eschar (tan, [place] or black) in the wound bed. (Active)  12/04/19 1837  Location: Ankle  Location Orientation: Anterior;Right  Staging: Unstageable - Full thickness tissue loss in which the base of the injury is covered by slough (yellow, tan, gray, green or [place]) and/or eschar (tan, [place] or black) in the wound bed.  Wound Description (Comments):   Present on Admission:          LABORATORY PANEL:  CBC Recent Labs  Lab 05/18/20 0541  WBC 7.7  HGB 9.4*  HCT 29.2*  PLT 215    Chemistries  Recent Labs  Lab 05/17/20 1204 05/18/20 0541 05/19/20 0524  NA 139   < > 139  K 4.3   < > 3.9  CL 105   < > 104  CO2 22   < > 22  GLUCOSE 193*   < > 102*  BUN 32*   < > 37*  CREATININE 1.88*   < > 1.98*  CALCIUM 8.9   < > 8.6*  AST 18  --   --   ALT 11  --   --   ALKPHOS 77  --   --   BILITOT 0.5  --   --    < > =  values in this interval not displayed.   Cardiac Enzymes No results for input(s): TROPONINI in the last 168 hours. RADIOLOGY:  PERIPHERAL VASCULAR CATHETERIZATION  Result Date: 05/19/2020 See Op Note  ASSESSMENT AND PLAN:  [name] [name] is a 75 y.o. female with medical history significant of DM, HTN, HLD, CKD-IV, hypothyroidism, gout, who presents with  foot ulcer with infection in right second toe.  Pt states that she noted a ulcer in dorsal aspects of right secondary toe about 1 week ago, which has been progressively worsening.  She noted little pus drainage.  Diabetic foot  ulcer with infection_right second toe: Patient does not have fever or leukocytosis.  Clinically not septic.  Hemodynamically stable. Dr. Cleda [name] of podiatry, Dr. Delana [name] of [facility] are consulted. - Empiric antimicrobial treatment with Unasyn - Blood cultures x 2 negative -  ABI--Non diagnostic secondary to incompressible vessel calcifications (medial arterial sclerosis of [name] [name]). Normal arterial waveforms at the level of the ankle. --Patient seen by vascular surgery Dr. Delana [name]  --2/11-- revascularization unsuccessful. Repeat plan for next Tuesday -- podiatry consult with Dr. Vickki [name] appreciated plans for debridement  Sometime next week after second attempt for revascularization  HTN (hypertension) -IV hydralazine as needed -Amlodipine, Coreg,  Hypothyroidism (acquired) -Synthroid  Hyperlipidemia -Lipitor  CKD (chronic kidney disease), stage IV ([place]): Stable.  Baseline creatinine 1.6-1.9 recently. Her creatinine is 1.88, BUN 3  Type II diabetes mellitus with renal manifestations [name] [name]): Recent A1c 7.0, not well controlled. - Pt is taking tresiba and 70/30 insulin as needed -Sliding scale insulin -cont lantus 30 units daily   GERD (gastroesophageal reflux disease) -Protonix  At baseline patient has been bedbound for more than seven months. She is taken care of by son and other family members at home.  DVT ppx: SQ Heparin   Code Status: Full code Family Communication:   Yes, patient's son    [name] [name] on the phone disposition Plan:  Anticipate discharge back to previous environment Consults called:   podiatry, Dr. Delana [name] of [facility]   Status is: Inpatient  Remains inpatient appropriate because:Inpatient level of care appropriate due to severity of illness.  Patient has multiple comorbidities, now presents with diabetic foot ulcer with infection right second toe, also has poor circulation.  Patient may need amputation of right second toe and revascularization by [facility].Will need  to be treated in hospital for at least 2 days.   Dispo: The patient is from: Home  Anticipated d/[name] is to: Home  Anticipated d/[name] date is:1- 2 days  Patient currently is not medically stable to d/[name].              Difficult to place patient No patient getting lower extremity angiogram and will be getting debridement over the weekend.    Level of care: Med-Surg Status is: Inpatient     TOTAL TIME TAKING CARE OF THIS PATIENT: *25* minutes.  >50% time spent on counselling and coordination of care  Note: This dictation was prepared with Dragon dictation along with smaller phrase technology. Any transcriptional errors that result from this process are unintentional.  Fritzi Mandes M.D    [facility]   CC: Primary care physician; Marguerita Merles, MDPatient ID: [name] [name], female   DOB: 05/23/1945, 75 y.o.   MRN: [id number]

## 2020-05-20 NOTE — Progress Notes (Signed)
[facility]  Daily Progress Note   Assessment/Planning:   POD #1 s/p dx angiogram   No evidence of complications from yesterday's procedure  Pt has significant tibial disease that will need recannulation  Dr. Ronalee Belts is planning on another attempt next week  Dr. Ronalee Belts will resume care of this patient on Monday   Subjective  - 1 Day Post-Op   No pain at cannulation site   Objective   Vitals:   05/19/20 1958 05/19/20 2344 05/20/20 0346 05/20/20 0718  BP: 139/67 133/68 136/62 (!) 134/56  Pulse: 67 65 67 62  Resp: 18 20 18 15   Temp: 98.7 F (37.1 C) 98.5 F (36.9 C) 98.4 F (36.9 C) 98.4 F (36.9 C)  TempSrc: Oral Oral Oral Oral  SpO2: 100% 98% 96% 100%  Weight:      Height:         Intake/Output Summary (Last 24 hours) at 05/20/2020 0914 Last data filed at 05/20/2020 1324 Gross per 24 hour  Intake 645.65 ml  Output 150 ml  Net 495.65 ml    VASC L groin: no hematoma, no obvious bruit, no ecyhmosis, R foot: 2nd toe bandaged along with rest of R leg    Laboratory   CBC CBC Latest Ref Rng & Units 05/18/2020 05/17/2020 04/02/2020  WBC 4.0 - 10.5 K/uL 7.7 6.5 11.9(H)  Hemoglobin 12.0 - 15.0 g/dL 9.4(L) 11.0(L) 11.4(L)  Hematocrit 36.0 - 46.0 % 29.2(L) 34.5(L) 36.8  Platelets 150 - 400 K/uL 215 237 249    BMET    Component Value Date/Time   NA 139 05/19/2020 0524   NA 142 04/17/2014 1131   K 3.9 05/19/2020 0524   K 4.0 04/17/2014 1131   CL 104 05/19/2020 0524   CL 109 (H) 04/17/2014 1131   CO2 22 05/19/2020 0524   CO2 24 04/17/2014 1131   GLUCOSE 102 (H) 05/19/2020 0524   GLUCOSE 89 04/17/2014 1131   BUN 37 (H) 05/19/2020 0524   BUN 22 (H) 04/17/2014 1131   CREATININE 1.98 (H) 05/19/2020 0524   CREATININE 1.78 (H) 04/17/2014 1131   CALCIUM 8.6 (L) 05/19/2020 0524   CALCIUM 8.8 04/17/2014 1131   GFRNONAA 26 (L) 05/19/2020 0524   GFRNONAA 30 (L) 04/17/2014 1131   GFRNONAA 37 (L) 04/22/2013 0946   GFRAA 29 (L) 12/07/2019 0645    GFRAA 37 (L) 04/17/2014 1131   GFRAA 42 (L) 04/22/2013 0946     Adele Barthel, MD, FACS, FSVS Covering for [facility]: [phone]

## 2020-05-20 NOTE — Plan of Care (Signed)

## 2020-05-20 NOTE — Progress Notes (Signed)
Follow-up right second ulceration.  No complaints at this time to the foot.  Discussed with vascular surgery and they are planning for further vascular work-up early next week.  Will defer further surgical intervention until then.  Discussed with patient and family.  Podiatry will follow-up after revascularization.

## 2020-05-21 DIAGNOSIS — L089 Local infection of the skin and subcutaneous tissue, unspecified: Secondary | ICD-10-CM | POA: Diagnosis not present

## 2020-05-21 DIAGNOSIS — E11628 Type 2 diabetes mellitus with other skin complications: Secondary | ICD-10-CM | POA: Diagnosis not present

## 2020-05-21 LAB — GLUCOSE, CAPILLARY
Glucose-Capillary: 53 mg/dL — ABNORMAL LOW (ref 70–99)
Glucose-Capillary: 136 mg/dL — ABNORMAL HIGH (ref 70–99)
Glucose-Capillary: 54 mg/dL — ABNORMAL LOW (ref 70–99)
Glucose-Capillary: 54 mg/dL — ABNORMAL LOW (ref 70–99)
Glucose-Capillary: 117 mg/dL — ABNORMAL HIGH (ref 70–99)

## 2020-05-21 MED ORDER — INSULIN GLARGINE 100 UNIT/ML ~~LOC~~ SOLN
15.0000 [IU] | Freq: Every day | SUBCUTANEOUS | Status: DC
  Administered 2020-05-21: 15 [IU] via SUBCUTANEOUS
  Filled 2020-05-21 (×2): qty 0.15

## 2020-05-21 MED ORDER — DEXTROSE 50 % IV SOLN
INTRAVENOUS | Status: AC
  Administered 2020-05-21: 50 mL
  Filled 2020-05-21: qty 50

## 2020-05-21 NOTE — Progress Notes (Signed)
[place] [facility] [place] NAME: [name] [name]    MR#:  [id number]  DATE OF BIRTH:  12-Aug-1945  SUBJECTIVE:  patient came in with right second to foot ulcer. She was seen in the podiatry clinic by Dr. Cleda Mccreedy who send her to the ER for further evaluation. Patient denies any pain.   Pt is scheduled for attempting another vascular procedure for revascularization next Tuesday. REVIEW OF SYSTEMS:   Review of Systems  Constitutional: Negative for chills, fever and weight loss.  HENT: Negative for ear discharge, ear pain and nosebleeds.   Eyes: Negative for blurred vision, pain and discharge.  Respiratory: Negative for sputum production, shortness of breath, wheezing and stridor.   Cardiovascular: Negative for chest pain, palpitations, orthopnea and PND.  Gastrointestinal: Negative for abdominal pain, diarrhea, nausea and vomiting.  Genitourinary: Negative for frequency and urgency.  Musculoskeletal: Negative for back pain and joint pain.  Neurological: Positive for weakness. Negative for sensory change, speech change and focal weakness.  Psychiatric/Behavioral: Negative for depression and hallucinations. The patient is not nervous/anxious.    Tolerating Diet: yes Tolerating PT: patient is bedbound at baseline  DRUG ALLERGIES:  No Known Allergies  VITALS:  Blood pressure (!) 142/42, pulse 67, temperature 98.2 F (36.8 C), resp. rate 16, height 5\' 7"  (1.702 m), weight 90 kg, SpO2 100 %.  PHYSICAL EXAMINATION:   Physical Exam  GENERAL:  75 y.o.-year-old patient lying in the bed with no acute distress. Obese LUNGS: Normal breath sounds bilaterally, no wheezing, rales, rhonchi. No use of accessory muscles of respiration.  CARDIOVASCULAR: S1, S2 normal. No murmurs, rubs, or gallops.  ABDOMEN: Soft, nontender, nondistended. Bowel sounds present. No organomegaly or mass.  EXTREMITIES:    NEUROLOGIC: Cranial nerves II through XII are intact. No focal  Motor or sensory deficits b/l.   PSYCHIATRIC:  patient is alert and oriented x 3.  SKIN:  Pressure Injury 11/16/19 Sacrum Posterior (Active)  11/16/19 1544  Location: Sacrum  Location Orientation: Posterior  Staging:   Wound Description (Comments):   Present on Admission: Yes     Pressure Injury 12/04/19 Ankle Anterior;Right Unstageable - Full thickness tissue loss in which the base of the injury is covered by slough (yellow, tan, gray, green or [place]) and/or eschar (tan, [place] or black) in the wound bed. (Active)  12/04/19 1837  Location: Ankle  Location Orientation: Anterior;Right  Staging: Unstageable - Full thickness tissue loss in which the base of the injury is covered by slough (yellow, tan, gray, green or [place]) and/or eschar (tan, [place] or black) in the wound bed.  Wound Description (Comments):   Present on Admission:          LABORATORY PANEL:  CBC Recent Labs  Lab 05/18/20 0541  WBC 7.7  HGB 9.4*  HCT 29.2*  PLT 215    Chemistries  Recent Labs  Lab 05/17/20 1204 05/18/20 0541 05/19/20 0524  NA 139   < > 139  K 4.3   < > 3.9  CL 105   < > 104  CO2 22   < > 22  GLUCOSE 193*   < > 102*  BUN 32*   < > 37*  CREATININE 1.88*   < > 1.98*  CALCIUM 8.9   < > 8.6*  AST 18  --   --   ALT 11  --   --   ALKPHOS 77  --   --   BILITOT 0.5  --   --    < > =  values in this interval not displayed.   Cardiac Enzymes No results for input(s): TROPONINI in the last 168 hours. RADIOLOGY:  No results found. ASSESSMENT AND PLAN:  [name] [name] is a 75 y.o. female with medical history significant of DM, HTN, HLD, CKD-IV, hypothyroidism, gout, who presents with  foot ulcer with infection in right second toe.  Pt states that she noted a ulcer in dorsal aspects of right secondary toe about 1 week ago, which has been progressively worsening.  She noted little pus drainage.  Diabetic foot ulcer with infection_right second toe: Patient does not have fever or leukocytosis.   Clinically not septic.  Hemodynamically stable. Dr. Cleda Mccreedy of podiatry, Dr. Delana Meyer of [facility] are consulted. 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 Normal arterial waveforms at the level of the ankle. --Patient seen by vascular surgery Dr. Delana Meyer  --2/11-- revascularization unsuccessful. Repeat plan for next Tuesday -- podiatry consult with Dr. Vickki Muff appreciated plans for debridement  Sometime next week after second attempt for revascularization  HTN (hypertension) -IV hydralazine as needed -Amlodipine, Coreg,  Hypothyroidism (acquired) -Synthroid  Hyperlipidemia -Lipitor  CKD (chronic kidney disease), stage IV ([place]): Stable.  Baseline creatinine 1.6-1.9 recently. Her creatinine is 1.88, BUN 3  Type II diabetes mellitus with renal manifestations [facility]): Recent A1c 7.0, not well controlled. -Sliding scale insulin -cont lantus daily and adjust doing according to sugars  GERD (gastroesophageal reflux disease) -Protonix  At baseline patient has been bedbound for more than seven months. She is taken care of by son and other family members at home.  DVT ppx: SQ Heparin   Code Status: Full code Family Communication:   Yes, patient's son    Mariely Mahr on the phone 2/14  disposition Plan:  Anticipate discharge back to previous environment Consults called:   podiatry, Dr. Delana Meyer of [facility]   Status is: Inpatient  Remains inpatient appropriate because:Inpatient level of care appropriate due to severity of illness.  Patient has multiple comorbidities, now presents with diabetic foot ulcer with infection right second toe, also has poor circulation.  Patient may need amputation of right second toe and revascularization by [facility].Will need to be treated in hospital for at least 2 days.   Dispo: The patient is from: Home   Anticipated d/c is to: Home  Anticipated d/c date is:TBD--pending vascular and podiatry w/u  Patient currently is not medically stable to d/c.              Difficult to place patient No   Level of care: Med-Surg Status is: Inpatient     TOTAL TIME TAKING CARE OF THIS PATIENT: *25* minutes.  >50% time spent on counselling and coordination of care  Note: This dictation was prepared with Dragon dictation along with smaller phrase technology. Any transcriptional errors that result from this process are unintentional.  Fritzi Mandes M.D    [facility]   CC: Primary care physician; Marguerita Merles, MDPatient ID: [name], female   DOB: January 26, 1946, 75 y.o.   MRN: [id number]

## 2020-05-21 NOTE — Plan of Care (Signed)
Assumed care for pt at 1900. Pt alert and oriented, on room air. She denied any concerns for nausea, headache, chest pain or vomit. Hourly rounding performed. Pt in no acute distress at this time. Call bell within reach, falls precaution in place Problem: Education: Goal: Knowledge of General Education information will improve Description: Including pain rating scale, medication(s)/side effects and non-pharmacologic comfort measures Outcome: Progressing   Problem: Coping: Goal: Level of anxiety will decrease Outcome: Progressing   Problem: Elimination: Goal: Will not experience complications related to bowel motility Outcome: Progressing   Problem: Pain Managment: Goal: General experience of comfort will improve Outcome: Progressing   Problem: Safety: Goal: Ability to remain free from injury will improve Outcome: Progressing   Problem: Skin Integrity: Goal: Risk for impaired skin integrity will decrease Outcome: Progressing

## 2020-05-22 ENCOUNTER — Other Ambulatory Visit (INDEPENDENT_AMBULATORY_CARE_PROVIDER_SITE_OTHER): Payer: Self-pay | Admitting: Vascular Surgery

## 2020-05-22 DIAGNOSIS — E11628 Type 2 diabetes mellitus with other skin complications: Secondary | ICD-10-CM | POA: Diagnosis not present

## 2020-05-22 DIAGNOSIS — E11621 Type 2 diabetes mellitus with foot ulcer: Secondary | ICD-10-CM | POA: Diagnosis not present

## 2020-05-22 DIAGNOSIS — L089 Local infection of the skin and subcutaneous tissue, unspecified: Secondary | ICD-10-CM | POA: Diagnosis not present

## 2020-05-22 DIAGNOSIS — L97519 Non-pressure chronic ulcer of other part of right foot with unspecified severity: Secondary | ICD-10-CM | POA: Diagnosis not present

## 2020-05-22 LAB — GLUCOSE, CAPILLARY
Glucose-Capillary: 49 mg/dL — ABNORMAL LOW (ref 70–99)
Glucose-Capillary: 106 mg/dL — ABNORMAL HIGH (ref 70–99)
Glucose-Capillary: 146 mg/dL — ABNORMAL HIGH (ref 70–99)
Glucose-Capillary: 120 mg/dL — ABNORMAL HIGH (ref 70–99)
Glucose-Capillary: 129 mg/dL — ABNORMAL HIGH (ref 70–99)

## 2020-05-22 LAB — CULTURE, BLOOD (ROUTINE X 2)
Culture: NO GROWTH
Culture: NO GROWTH

## 2020-05-22 NOTE — Progress Notes (Signed)
[facility] Daily Progress Note   Subjective: Patient without complaint this AM.  No issues overnight.  Objective: Vitals:   05/21/20 2006 05/22/20 0002 05/22/20 0726 05/22/20 1121  BP: 129/62 124/60 122/67 (!) 122/57  Pulse: 74 72 68 67  Resp: 17 17 16 16   Temp: 98.7 F (37.1 C) 98.7 F (37.1 C) 98.7 F (37.1 C) 98.8 F (37.1 C)  TempSrc:  Oral    SpO2: 100% 97% 100% 99%  Weight:      Height:        Intake/Output Summary (Last 24 hours) at 05/22/2020 1350 Last data filed at 05/22/2020 1019 Gross per 24 hour  Intake 738.46 ml  Output 300 ml  Net 438.46 ml   Physical Exam: A&Ox3, NAD CV: RRR Pulmonary: CTA Bilaterally Abdomen: Soft, Nontender, Nondistended Left groin:  Access site: Clean dry and intact Vascular:  Right lower extremity: Thigh soft.  Calf soft.  Podiatry dressing clean dry and intact.   Laboratory: CBC    Component Value Date/Time   WBC 7.7 05/18/2020 0541   HGB 9.4 (L) 05/18/2020 0541   HGB 10.7 (L) 04/17/2014 1131   HCT 29.2 (L) 05/18/2020 0541   HCT 32.9 (L) 04/17/2014 1131   PLT 215 05/18/2020 0541   PLT 226 04/17/2014 1131   BMET    Component Value Date/Time   NA 139 05/19/2020 0524   NA 142 04/17/2014 1131   K 3.9 05/19/2020 0524   K 4.0 04/17/2014 1131   CL 104 05/19/2020 0524   CL 109 (H) 04/17/2014 1131   CO2 22 05/19/2020 0524   CO2 24 04/17/2014 1131   GLUCOSE 102 (H) 05/19/2020 0524   GLUCOSE 89 04/17/2014 1131   BUN 37 (H) 05/19/2020 0524   BUN 22 (H) 04/17/2014 1131   CREATININE 1.98 (H) 05/19/2020 0524   CREATININE 1.78 (H) 04/17/2014 1131   CALCIUM 8.6 (L) 05/19/2020 0524   CALCIUM 8.8 04/17/2014 1131   GFRNONAA 26 (L) 05/19/2020 0524   GFRNONAA 30 (L) 04/17/2014 1131   GFRNONAA 37 (L) 04/22/2013 0946   GFRAA 29 (L) 12/07/2019 0645   GFRAA 37 (L) 04/17/2014 1131   GFRAA 42 (L) 04/22/2013 0946   Assessment/Planning: The patient is a 75 year old female with multiple medical issues including  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  All questions were answered.  The patient wishes to proceed.  Discussed with Dr. Eber Hong Mark Benecke PA-C 05/22/2020 1:50 PM

## 2020-05-22 NOTE — Progress Notes (Signed)
[place] [facility] [place] NAME: [name] [name]    MR#:  [id number]  DATE OF BIRTH:  Dec 14, 1945  SUBJECTIVE:  patient came in with right second to foot ulcer. She was seen in the podiatry clinic by Dr. Cleda Mccreedy who send her to the ER for further evaluation. Patient denies any pain.   Pt is scheduled for attempting another vascular procedure for revascularization tomorrow  Low sugars overall. Had BM yday REVIEW OF SYSTEMS:   Review of Systems  Constitutional: Negative for chills, fever and weight loss.  HENT: Negative for ear discharge, ear pain and nosebleeds.   Eyes: Negative for blurred vision, pain and discharge.  Respiratory: Negative for sputum production, shortness of breath, wheezing and stridor.   Cardiovascular: Negative for chest pain, palpitations, orthopnea and PND.  Gastrointestinal: Negative for abdominal pain, diarrhea, nausea and vomiting.  Genitourinary: Negative for frequency and urgency.  Musculoskeletal: Negative for back pain and joint pain.  Neurological: Positive for weakness. Negative for sensory change, speech change and focal weakness.  Psychiatric/Behavioral: Negative for depression and hallucinations. The patient is not nervous/anxious.    Tolerating Diet: yes Tolerating PT: patient is bedbound at baseline  DRUG ALLERGIES:  No Known Allergies  VITALS:  Blood pressure (!) 122/57, pulse 67, temperature 98.8 F (37.1 C), resp. rate 16, height 5\' 7"  (1.702 m), weight 90 kg, SpO2 99 %.  PHYSICAL EXAMINATION:   Physical Exam  GENERAL:  75 y.o.-year-old patient lying in the bed with no acute distress. Obese LUNGS: Normal breath sounds bilaterally, no wheezing, rales, rhonchi. No use of accessory muscles of respiration.  CARDIOVASCULAR: S1, S2 normal. No murmurs, rubs, or gallops.  ABDOMEN: Soft, nontender, nondistended. Bowel sounds present. No organomegaly or mass.  EXTREMITIES:    NEUROLOGIC: Cranial nerves II  through XII are intact. No focal Motor or sensory deficits b/l.   PSYCHIATRIC:  patient is alert and oriented x 3.  SKIN:  Pressure Injury 11/16/19 Sacrum Posterior (Active)  11/16/19 1544  Location: Sacrum  Location Orientation: Posterior  Staging:   Wound Description (Comments):   Present on Admission: Yes     Pressure Injury 12/04/19 Ankle Anterior;Right Unstageable - Full thickness tissue loss in which the base of the injury is covered by slough (yellow, tan, gray, green or [place]) and/or eschar (tan, [place] or black) in the wound bed. (Active)  12/04/19 1837  Location: Ankle  Location Orientation: Anterior;Right  Staging: Unstageable - Full thickness tissue loss in which the base of the injury is covered by slough (yellow, tan, gray, green or [place]) and/or eschar (tan, Kal[place] or black) in the wound bed.  Wound Description (Comments):   Present on Admission:          LABORATORY PANEL:  CBC Recent Labs  Lab 05/18/20 0541  WBC 7.7  HGB 9.4*  HCT 29.2*  PLT 215    Chemistries  Recent Labs  Lab 05/17/20 1204 05/18/20 0541 05/19/20 0524  NA 139   < > 139  K 4.3   < > 3.9  CL 105   < > 104  CO2 22   < > 22  GLUCOSE 193*   < > 102*  BUN 32*   < > 37*  CREATININE 1.88*   < > 1.98*  CALCIUM 8.9   < > 8.6*  AST 18  --   --   ALT 11  --   --   ALKPHOS 77  --   --  BILITOT 0.5  --   --    < > = values in this interval not displayed.   Cardiac Enzymes No results for input(s): TROPONINI in the last 168 hours. RADIOLOGY:  No results found. ASSESSMENT AND PLAN:  [name] [name] is a 75 y.o. female with medical history significant of DM, HTN, HLD, CKD-IV, hypothyroidism, gout, who presents with  foot ulcer with infection in right second toe.  Pt states that she noted a ulcer in dorsal aspects of right secondary toe about 1 week ago, which has been progressively worsening.  She noted little pus drainage.  Diabetic foot ulcer with infection_right second toe: Patient does  not have fever or leukocytosis.  Clinically not septic.  Hemodynamically stable. Dr. Cleda Mccreedy of podiatry, Dr. Delana Meyer of [facility] are consulted. 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 Normal arterial waveforms at the level of the ankle. --Patient seen by vascular surgery Dr. Delana Meyer  --2/11-- revascularization unsuccessful. Repeat plan for next Tuesday -- podiatry consult with Dr. Vickki Muff appreciated plans for debridement  Sometime next week after second attempt for revascularization  HTN (hypertension) -IV hydralazine as needed -Amlodipine, Coreg,  Hypothyroidism (acquired) -Synthroid  Hyperlipidemia -Lipitor  CKD (chronic kidney disease), stage IV ([place]): Stable.  Baseline creatinine 1.6-1.9 recently. Her creatinine is 1.88, BUN 3  Type II diabetes mellitus with renal manifestations [facility]): Recent A1c 7.0, not well controlled. -Sliding scale insulin -Hold Lantus due to low sugars  GERD (gastroesophageal reflux disease) -Protonix  At baseline patient has been bedbound for more than seven months. She is taken care of by son and other family members at home.  DVT ppx: SQ Heparin   Code Status: Full code Family Communication:   Yes, patient's son  Kylah Maresh on the phone 2/12  disposition Plan:  Anticipate discharge back to previous environment Consults called:   podiatry, Dr. Delana Meyer of [facility]   Status is: Inpatient  Remains inpatient appropriate because:Inpatient level of care appropriate due to severity of illness.  Patient has multiple comorbidities, now presents with diabetic foot ulcer with infection right second toe, also has poor circulation.   Pt will get 2nd attempt for revascularization by [facility] tomorrow and then surgery by Podiatry  Dispo: The patient is from: Home  Anticipated d/c is to:  Home  Anticipated d/c date is:TBD--pending vascular and podiatry work up  Patient currently is not medically stable to d/c.              Difficult to place patient No   Level of care: Med-Surg Status is: Inpatient     TOTAL TIME TAKING CARE OF THIS PATIENT: *25* minutes.  >50% time spent on counselling and coordination of care  Note: This dictation was prepared with Dragon dictation along with smaller phrase technology. Any transcriptional errors that result from this process are unintentional.  Fritzi Mandes M.D    [facility]   CC: Primary care physician; Marguerita Merles, MDPatient ID: [name] [name], female   DOB: June 21, 1945, 75 y.o.   MRN: [id number]

## 2020-05-22 NOTE — Care Management Important Message (Signed)
Important Message  Patient Details  Name: [name] [name] MRN: [id number] Date of Birth: 1945/09/22   Medicare Important Message Given:  Yes     Dannette Barbara 05/22/2020, 2:32 PM

## 2020-05-22 NOTE — Progress Notes (Signed)
ORTHOPAEDIC progress note  REQUESTING PHYSICIAN: Fritzi Mandes, MD  Chief Complaint: Right second toe wound  HPI: [name] [name] is a 75 y.o. female who presents resting in bed comfortably with son at bedside.  Patient is awaiting to undergo another vascular procedure to try to improve blood flow to the right lower extremity prior to amputation of the right second toe.  Patient denies any complaints today other than some hip pain.  Past Medical History:  Diagnosis Date  . Diabetes mellitus without complication ([place])   . Humerus fracture 04/20/2018   left  . Hyperlipidemia   . Hypertension   . Hypothyroidism   . Vertigo    several yrs ago  . Wears dentures    partial upper and lower (loose)   Past Surgical History:  Procedure Laterality Date  . CATARACT EXTRACTION W/PHACO Left 12/01/2018   Procedure: CATARACT EXTRACTION PHACO AND INTRAOCULAR LENS PLACEMENT ([place]) LEFT DIABETIC;  Surgeon: Birder Robson, MD;  Location: [place];  Service: Ophthalmology;  Laterality: Left;  Diabetic - insulin  . COLONOSCOPY WITH PROPOFOL N/A 07/07/2019   Procedure: COLONOSCOPY WITH PROPOFOL;  Surgeon: Robert Bellow, MD;  Location: [facility] END[facility]COPY;  Service: Endoscopy;  Laterality: N/A;  . ESOPHAGOGASTRODUODENOSCOPY (EGD) WITH PROPOFOL N/A 07/07/2019   Procedure: ESOPHAGOGASTRODUODENOSCOPY (EGD) WITH PROPOFOL;  Surgeon: Robert Bellow, MD;  Location: [facility] ENDOSCOPY;  Service: Endoscopy;  Laterality: N/A;  . FOOT SURGERY Right    lesion excision   Social History   Socioeconomic History  . Marital status: Divorced    Spouse name: Not on file  . Number of children: Not on file  . Years of education: Not on file  . Highest education level: Not on file  Occupational History  . Not on file  Tobacco Use  . Smoking status: Never Smoker  . Smokeless tobacco: Never Used  Vaping Use  . Vaping Use: Never used  Substance and Sexual Activity  . Alcohol use: No  . Drug use: Never  .  Sexual activity: Not on file  Other Topics Concern  . Not on file  Social History Narrative  . Not on file   Social Determinants of Health   Financial Resource Strain: Not on file  Food Insecurity: Not on file  Transportation Needs: Not on file  Physical Activity: Not on file  Stress: Not on file  Social Connections: Not on file   Family History  Problem Relation Age of Onset  . Breast cancer Neg Hx    No Known Allergies Prior to Admission medications   Medication Sig Start Date End Date Taking? Authorizing Provider  amLODipine (NORVASC) 10 MG tablet Take 1 tablet by mouth daily. 05/02/16  Yes [provider]  ASPIRIN 81 PO Take by mouth daily.   Yes [provider]  atorvastatin (LIPITOR) 40 MG tablet Take 1 tablet by mouth at bedtime.  05/02/16  Yes [provider]  carvedilol (COREG) 25 MG tablet Take 1 tablet by mouth 2 (two) times daily. 05/02/16  Yes [provider]  donepezil (ARICEPT) 10 MG tablet Take 10 mg by mouth at bedtime.  04/21/18  Yes [provider]  insulin aspart protamine- aspart (NOVOLOG MIX 70/30) (70-30) 100 UNIT/ML injection Inject 15 Units into the skin 2 (two) times daily with a meal.   Yes [provider]  polyethylene glycol (MIRALAX / GLYCOLAX) 17 g packet Take 17 g by mouth daily. Mix one tablespoon with 8oz of your favorite juice or water every day  until you are having soft formed stools. Then start taking once daily if you didn't have a stool the day before. 04/02/20  Yes Merlyn Lot, MD  SYNTHROID 50 MCG tablet Take 1 tablet by mouth daily. 04/21/18  Yes [provider]  TRESIBA FLEXTOUCH 100 UNIT/ML SOPN FlexTouch Pen Inject 80 Units into the skin at bedtime.  04/21/18  Yes [provider]  bisacodyl (DULCOLAX) 10 MG suppository Place 1 suppository (10 mg total) rectally as needed for moderate constipation. 04/02/20   Merlyn Lot, MD   No results found.  Positive ROS:  All other systems have been reviewed and were otherwise negative with the exception of those mentioned in the HPI and as above.  12 point ROS was performed.  Physical Exam: General: Alert and oriented.  No apparent distress.  Vascular:  Left foot:Dorsalis Pedis:  absent Posterior Tibial:  absent  Right foot: Dorsalis Pedis:  absent Posterior Tibial:  absent  Neuro:absent protective sensation  Derm: Obvious full-thickness ulcer to the right second toe with exposed joint and cartilage of the PIPJ.  Noted erythema to the dorsal foot extending to the metatarsophalangeal joint region.  Ulceration also noted to the right lateral heel that appears to be fairly superficial within the subcutaneous tissue but appears to have mild maceration present, no odor, no erythema or edema.  Ortho/MS: Diffuse edema to the right foot.  Obvious Charcot changes to the right foot and ankle.  Assessment: 1.  Osteomyelitis right second toe 2.  Subcutaneous ulceration right lateral heel, pressure ulcer 3.  Charcot neuroarthropathy 4.  Diabetes type 2 polyneuropathy  Plan: -Patient seen and examined today. -Both ulcerative sites in the right foot appear to be stable at this time with no worsening signs of infection present. -Continue with daily dressing changes. -Discussed all treatment options with the patient both conservative and surgical attempts at correction clean potential risks and complications.  At this time patient is agreeable for right second toe amputation with ulceration wound debridement.  All questions answered including postoperative course in detail. -Plan for surgery likely on Wednesday, 05/24/2020 after further attempt at revascularization by vascular team if successful.  Surgery will likely be in the late afternoon that day.  Patient will be n.p.o. after breakfast. -Appreciate medicine recommendations for antibiotic therapy.  Caroline More, DPM  05/22/2020 1:22 PM

## 2020-05-23 ENCOUNTER — Encounter
Admission: EM | Disposition: A | Discharge status: Home Health | Payer: Self-pay | Source: Home / Self Care | Attending: Internal Medicine

## 2020-05-23 ENCOUNTER — Encounter: Payer: Self-pay | Admitting: Vascular Surgery

## 2020-05-23 DIAGNOSIS — E11628 Type 2 diabetes mellitus with other skin complications: Secondary | ICD-10-CM | POA: Diagnosis not present

## 2020-05-23 DIAGNOSIS — N141 Nephropathy induced by other drugs, medicaments and biological substances: Secondary | ICD-10-CM | POA: Diagnosis not present

## 2020-05-23 DIAGNOSIS — N184 Chronic kidney disease, stage 4 (severe): Secondary | ICD-10-CM | POA: Diagnosis not present

## 2020-05-23 DIAGNOSIS — T508X5A Adverse effect of diagnostic agents, initial encounter: Secondary | ICD-10-CM

## 2020-05-23 DIAGNOSIS — E785 Hyperlipidemia, unspecified: Secondary | ICD-10-CM | POA: Diagnosis not present

## 2020-05-23 LAB — CBC
HCT: 25.1 % — ABNORMAL LOW (ref 36.0–46.0)
Hemoglobin: 7.9 g/dL — ABNORMAL LOW (ref 12.0–15.0)
MCH: 29.5 pg (ref 26.0–34.0)
MCHC: 31.5 g/dL (ref 30.0–36.0)
MCV: 93.7 fL (ref 80.0–100.0)
Platelets: 185 10*3/uL (ref 150–400)
RBC: 2.68 MIL/uL — ABNORMAL LOW (ref 3.87–5.11)
RDW: 14.2 % (ref 11.5–15.5)
WBC: 8.1 10*3/uL (ref 4.0–10.5)
nRBC: 0 % (ref 0.0–0.2)

## 2020-05-23 LAB — HEPATITIS B SURFACE ANTIGEN: Hepatitis B Surface Ag: NONREACTIVE

## 2020-05-23 LAB — BASIC METABOLIC PANEL
Anion gap: 12 (ref 5–15)
BUN: 48 mg/dL — ABNORMAL HIGH (ref 8–23)
CO2: 20 mmol/L — ABNORMAL LOW (ref 22–32)
Calcium: 7.8 mg/dL — ABNORMAL LOW (ref 8.9–10.3)
Chloride: 103 mmol/L (ref 98–111)
Creatinine, Ser: 3.71 mg/dL — ABNORMAL HIGH (ref 0.44–1.00)
GFR, Estimated: 12 mL/min — ABNORMAL LOW (ref >60)
Glucose, Bld: 106 mg/dL — ABNORMAL HIGH (ref 70–99)
Potassium: 4.2 mmol/L (ref 3.5–5.1)
Sodium: 135 mmol/L (ref 135–145)

## 2020-05-23 LAB — GLUCOSE, CAPILLARY
Glucose-Capillary: 86 mg/dL (ref 70–99)
Glucose-Capillary: 96 mg/dL (ref 70–99)
Glucose-Capillary: 103 mg/dL — ABNORMAL HIGH (ref 70–99)
Glucose-Capillary: 95 mg/dL (ref 70–99)

## 2020-05-23 LAB — HEPATITIS B SURFACE ANTIBODY,QUALITATIVE: Hep B S Ab: NONREACTIVE

## 2020-05-23 SURGERY — LOWER EXTREMITY ANGIOGRAPHY
Anesthesia: Moderate Sedation | Laterality: Right

## 2020-05-23 MED ORDER — DEXTROSE-NACL 5-0.45 % IV SOLN: INTRAVENOUS | Status: DC

## 2020-05-23 MED ORDER — DEXTROSE 5 % IV SOLN: INTRAVENOUS | Status: DC

## 2020-05-23 MED ORDER — POLYETHYLENE GLYCOL 3350 17 G PO PACK: 17.0000 g | PACK | Freq: Every day | ORAL | Status: DC | PRN

## 2020-05-23 MED ORDER — SODIUM CHLORIDE 0.9 % IV SOLN
3.0000 g | Freq: Two times a day (BID) | INTRAVENOUS | Status: DC
  Administered 2020-05-23 – 2020-05-26 (×7): 3 g via INTRAVENOUS
  Filled 2020-05-23: qty 3
  Filled 2020-05-23: qty 8
  Filled 2020-05-23: qty 3
  Filled 2020-05-23: qty 8
  Filled 2020-05-23: qty 3
  Filled 2020-05-23: qty 8
  Filled 2020-05-23 (×2): qty 3
  Filled 2020-05-23: qty 8

## 2020-05-23 MED ORDER — POLYETHYLENE GLYCOL 3350 17 G PO PACK: 17.0000 g | PACK | Freq: Every day | ORAL | Status: DC

## 2020-05-23 MED ORDER — SODIUM CHLORIDE 0.9 % IV SOLN: INTRAVENOUS | Status: DC

## 2020-05-23 NOTE — Progress Notes (Signed)
Podiatry was planning for surgery tomorrow but will hold until vascular evaluation.  Awaiting improvement in kidney fxn.  D/W family and vascular will plan to hold on surgical debridment/amputation till revascularization.  Wound is stable without purulence/lymphangitis.  Exposed PIPJ to 2nd toe.

## 2020-05-23 NOTE — Progress Notes (Addendum)
[name] [name] [name] at [place] NAME: [name] [name] [name]    MR#:  [id number]  DATE OF BIRTH:  04-Mar-1946  SUBJECTIVE:  patient came in with right second to foot ulcer. She was seen in the podiatry clinic by Dr. Cleda [name] [name] who send her to the ER for further evaluation. Patient denies any pain.  Asking me when she can go home. REVIEW OF SYSTEMS:   Review of Systems  Constitutional: Negative for chills, fever and weight loss.  HENT: Negative for ear discharge, ear pain and nosebleeds.   Eyes: Negative for blurred vision, pain and discharge.  Respiratory: Negative for sputum production, shortness of breath, wheezing and stridor.   Cardiovascular: Negative for chest pain, palpitations, orthopnea and PND.  Gastrointestinal: Negative for abdominal pain, diarrhea, nausea and vomiting.  Genitourinary: Negative for frequency and urgency.  Musculoskeletal: Negative for back pain and joint pain.  Neurological: Positive for weakness. Negative for sensory change, speech change and focal weakness.  Psychiatric/Behavioral: Negative for depression and hallucinations. The patient is not nervous/anxious.    Tolerating Diet: yes Tolerating PT: patient is bedbound at baseline  DRUG ALLERGIES:  No Known Allergies  VITALS:  Blood pressure 121/69, pulse 67, temperature 98.7 F (37.1 C), resp. rate 17, height 5\' 7"  (1.702 m), weight 90 kg, SpO2 99 %.  PHYSICAL EXAMINATION:   Physical Exam  GENERAL:  75 y.o.-year-old patient lying in the bed with no acute distress. Obese LUNGS: Normal breath sounds bilaterally, no wheezing, rales, rhonchi. No use of accessory muscles of respiration.  CARDIOVASCULAR: S1, S2 normal. No murmurs, rubs, or gallops.  ABDOMEN: Soft, nontender, nondistended. Bowel sounds present. No organomegaly or mass.  EXTREMITIES:    NEUROLOGIC: Cranial nerves II through XII are intact. No focal Motor or sensory deficits b/l.   PSYCHIATRIC:  patient is alert and  oriented x 3.  SKIN:  Pressure Injury 11/16/19 Sacrum Posterior (Active)  11/16/19 1544  Location: Sacrum  Location Orientation: Posterior  Staging:   Wound Description (Comments):   Present on Admission: Yes     Pressure Injury 12/04/19 Ankle Anterior;Right Unstageable - Full thickness tissue loss in which the base of the injury is covered by slough (yellow, tan, gray, green or [name] [name] [name]) and/or eschar (tan, [name] [name] [name] or black) in the wound bed. (Active)  12/04/19 1837  Location: Ankle  Location Orientation: Anterior;Right  Staging: Unstageable - Full thickness tissue loss in which the base of the injury is covered by slough (yellow, tan, gray, green or [name] [name]) and/or eschar (tan, [name] [name] [name] or black) in the wound bed.  Wound Description (Comments):   Present on Admission:          LABORATORY PANEL:  CBC Recent Labs  Lab 05/23/20 0422  WBC 8.1  HGB 7.9*  HCT 25.1*  PLT 185    Chemistries  Recent Labs  Lab 05/17/20 1204 05/18/20 0541 05/23/20 0422  NA 139   < > 135  K 4.3   < > 4.2  CL 105   < > 103  CO2 22   < > 20*  GLUCOSE 193*   < > 106*  BUN 32*   < > 48*  CREATININE 1.88*   < > 3.71*  CALCIUM 8.9   < > 7.8*  AST 18  --   --   ALT 11  --   --   ALKPHOS 77  --   --   BILITOT 0.5  --   --    < > =  values in this interval not displayed.   Cardiac Enzymes No results for input(s): TROPONINI in the last 168 hours. RADIOLOGY:  No results found. ASSESSMENT AND PLAN:  [name] [name] [name] is a 75 y.o. female with medical history significant of DM, HTN, HLD, CKD-IV, hypothyroidism, gout, who presents with  foot ulcer with infection in right second toe.  Pt states that she noted a ulcer in dorsal aspects of right secondary toe about 1 week ago, which has been progressively worsening.  She noted little pus drainage.  Diabetic foot ulcer with infection right second toe: Patient does not have fever or leukocytosis.  Clinically not septic.  Hemodynamically stable. Dr. Cleda [name] [name] of  podiatry, Dr. Delana [name] of [facility] are consulted. 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 Normal arterial waveforms at the level of the ankle. --Patient seen by vascular surgery Dr. Delana [name]  --2/11-- revascularization unsuccessful. Repeat plan for next Tuesday -- podiatry consult with Dr. Vickki [name] appreciated plans for possible amputation Sometime next week after second attempt for revascularization --2/15-- vascular procedure for second attempt to revascularization canceled today due to elevated creatinine  HTN (hypertension) -IV hydralazine as needed -Amlodipine, Coreg,  Hypothyroidism (acquired) -Synthroid  Hyperlipidemia -Lipitor  Acute on CKD (chronic kidney disease), stage IV ([name] [name] [name]):  suspected contrast induced nephropathy Baseline creatinine 1.6-1.9 recently. - Her creatinine is 1.96 --angiogram---1.98--3.17 -- nephrology consultation with Dr. Juleen [name] -IV fluids  Type II diabetes mellitus with renal manifestations [facility]): Recent A1c 7.0, not well controlled. -Sliding scale insulin -Hold Lantus due to low sugars  GERD (gastroesophageal reflux disease) -Protonix  At baseline patient has been bedbound for more than seven months. She is taken care of by son and other family members at home.  DVT ppx: SQ Heparin   Code Status: Full code Family Communication:   Yes, patient's son  [name] [name] [name] on the phone 2/15  disposition Plan:  Anticipate discharge back to previous environment Consults called:   podiatry, Dr. Delana [name] of [facility]   Status is: Inpatient  patient remains in-house secondary to diabetic foot ulcer with possible osteomyelitis. She is status post angiogram with unsuccessful attempt to revascularization. Creatinine elevated to 3.17 suspected contrast induced nephropathy. Nephrology consultation made. Continue IV  fluids. Monitor creatinine [name] [name]. Podiatry plans on for possible amputation after creatinine stabilized.  Dispo: The patient is from: Home  Anticipated d/c is to: Home  Anticipated d/c date is:TBD--pending vascular and podiatry work up  Patient currently is not medically stable to d/c.              Difficult to place patient No   Level of care: Med-Surg Status is: Inpatient     TOTAL TIME TAKING CARE OF THIS PATIENT: *25* minutes.  >50% time spent on counselling and coordination of care  Note: This dictation was prepared with Dragon dictation along with smaller phrase technology. Any transcriptional errors that result from this process are unintentional.  Fritzi Mandes M.D    [facility]   CC: Primary care physician; Marguerita Merles, MDPatient ID: [name] [name] [name], female   DOB: 10-Jan-1946, 75 y.o.   MRN: [id number]

## 2020-05-23 NOTE — Progress Notes (Signed)
[facility]  ROUNDING NOTE   Subjective:   [name] [name] admitted to [facility] on 05/17/2020 for Pain [R52] Diabetic foot infection ([place]) [E11.628, L08.9] Type 2 diabetes mellitus with right diabetic foot ulcer ([place]) [Q46.962, L97.519]  Patient underwent angiogram of left lower extremity on 2/11 by Dr. Delana Meyer.  Creatinine 1.88 with GFR of 28 on admission. Creatinine 3.71 today and nephrology consulted.   Son at bedside.   Objective:  Vital signs in last 24 hours:  Temp:  [98 F (36.7 C)-99.6 F (37.6 C)] 98.7 F (37.1 C) (02/15 1156) Pulse Rate:  [60-73] 67 (02/15 1156) Resp:  [16-18] 17 (02/15 1156) BP: (107-134)/(53-69) 121/69 (02/15 1156) SpO2:  [99 %-100 %] 99 % (02/15 1156)  Weight change:  Filed Weights   05/17/20 1201  Weight: 90 kg    Intake/Output: I/O last 3 completed shifts: In: 618.5 [P.O.:240; I.V.:56.8; IV Piggyback:321.7] Out: 500 [Urine:500]   Intake/Output this shift:  No intake/output data recorded.  Physical Exam: General: NAD,   Head: Normocephalic, atraumatic. Moist oral mucosal membranes  Eyes: Anicteric, PERRL  Neck: Supple, trachea midline  Lungs:  Clear to auscultation  Heart: Regular rate and rhythm  Abdomen:  Soft, nontender, obese  Extremities:  trace peripheral edema. Left foot in dressings  Neurologic: Nonfocal, moving all four extremities  Skin: No lesions        Basic Metabolic Panel: Recent Labs  Lab 05/17/20 1204 05/18/20 0541 05/19/20 0524 05/23/20 0422  NA 139 136 139 135  K 4.3 3.9 3.9 4.2  CL 105 103 104 103  CO2 22 25 22  20*  GLUCOSE 193* 118* 102* 106*  BUN 32* 36* 37* 48*  CREATININE 1.88* 1.96* 1.98* 3.71*  CALCIUM 8.9 8.7* 8.6* 7.8*    Liver Function Tests: Recent Labs  Lab 05/17/20 1204  AST 18  ALT 11  ALKPHOS 77  BILITOT 0.5  PROT 8.1  ALBUMIN 3.3*   No results for input(s): LIPASE, AMYLASE in the last 168 hours. No results for input(s): AMMONIA in the last 168  hours.  CBC: Recent Labs  Lab 05/17/20 1204 05/18/20 0541 05/23/20 0422  WBC 6.5 7.7 8.1  NEUTROABS 3.7  --   --   HGB 11.0* 9.4* 7.9*  HCT 34.5* 29.2* 25.1*  MCV 93.5 93.0 93.7  PLT 237 215 185    Cardiac Enzymes: No results for input(s): CKTOTAL, CKMB, CKMBINDEX, TROPONINI in the last 168 hours.  BNP: Invalid input(s): POCBNP  CBG: Recent Labs  Lab 05/22/20 1145 05/22/20 1656 05/22/20 2118 05/23/20 0730 05/23/20 1136  GLUCAP 146* 120* 129* 86 96    Microbiology: Results for orders placed or performed during the hospital encounter of 05/17/20  SARS CORONAVIRUS 2 (TAT 6-24 HRS) Nasopharyngeal Nasopharyngeal Swab     Status: None   Collection Time: 05/17/20  4:32 PM   Specimen: Nasopharyngeal Swab  Result Value Ref Range Status   SARS Coronavirus 2 NEGATIVE NEGATIVE Final    Comment: (NOTE) SARS-CoV-2 target nucleic acids are NOT DETECTED.  The SARS-CoV-2 RNA is generally detectable in upper and lower respiratory specimens during the acute phase of infection. Negative results do not preclude SARS-CoV-2 infection, do not rule out co-infections with other pathogens, and should not be used as the sole basis for treatment or other patient management decisions. Negative results must be combined with clinical observations, patient history, and epidemiological information. The expected result is Negative.  Fact Sheet for Patients: SugarRoll.be  Fact Sheet for Healthcare Providers: https://www.woods-mathews.com/  This test is not yet approved or cleared by the [place] and  has been authorized for detection and/or diagnosis of SARS-CoV-2 by FDA under an Emergency Use Authorization (EUA). This EUA will remain  in effect (meaning this test can be used) for the duration of the COVID-19 declaration under Se ction 564(b)(1) of the Act, 21 U.S.C. section 360bbb-3(b)(1), unless the authorization is terminated or revoked  sooner.  Performed at [facility], [place] [address] [place], [place] [address]   CULTURE, BLOOD (ROUTINE X 2) w Reflex to ID Panel     Status: None   Collection Time: 05/17/20  5:04 PM   Specimen: BLOOD  Result Value Ref Range Status   Specimen Description BLOOD BLOOD LEFT ARM  Final   Special Requests   Final    BOTTLES DRAWN AEROBIC AND ANAEROBIC Blood Culture results may not be optimal due to an excessive volume of blood received in culture bottles   Culture   Final    NO GROWTH 5 DAYS Performed at [facility], [address] [place], [place] [address]    Report Status 05/22/2020 FINAL  Final  CULTURE, BLOOD (ROUTINE X 2) w Reflex to ID Panel     Status: None   Collection Time: 05/17/20  5:04 PM   Specimen: BLOOD  Result Value Ref Range Status   Specimen Description BLOOD RIGHT ANTECUBITAL  Final   Special Requests   Final    BOTTLES DRAWN AEROBIC AND ANAEROBIC Blood Culture results may not be optimal due to an excessive volume of blood received in culture bottles   Culture   Final    NO GROWTH 5 DAYS Performed at [facility], [address] [place], [place] [address]    Report Status 05/22/2020 FINAL  Final    Coagulation Studies: No results for input(s): LABPROT, INR in the last 72 hours.  Urinalysis: No results for input(s): COLORURINE, LABSPEC, PHURINE, GLUCOSEU, HGBUR, BILIRUBINUR, KETONESUR, PROTEINUR, UROBILINOGEN, NITRITE, LEUKOCYTESUR in the last 72 hours.  Invalid input(s): APPERANCEUR    Imaging: No results found.   Medications:   . sodium chloride 10 mL/hr at 05/22/20 0425  . sodium chloride 75 mL/hr at 05/23/20 1153  . ampicillin-sulbactam (UNASYN) IV     . amLODipine  10 mg Oral Daily  . aspirin  81 mg Oral Daily  . atorvastatin  40 mg Oral Daily  . carvedilol  25 mg Oral BID  . donepezil  10 mg Oral QHS  . heparin  5,000 Units Subcutaneous Q8H  . insulin aspart  0-5 Units Subcutaneous QHS  . insulin aspart  0-9  Units Subcutaneous TID WC  . levothyroxine  50 mcg Oral Q0600  . sodium chloride flush  3 mL Intravenous Q12H   sodium chloride, acetaminophen, bisacodyl, hydrALAZINE, HYDROmorphone (DILAUDID) injection, ondansetron (ZOFRAN) IV, sodium chloride flush  Assessment/ Plan:  Ms. KAIZLEE CARLINO is a 75 y.o. black female with hypothyroidism, hypertension, hyperlipidemia, peripheral vascular disease, diabetes mellitus type II insulin dependent who was admitted to [facility] on 05/17/2020 for Pain [R52] Diabetic foot infection ([place]) [Q94.765, L08.9] Type 2 diabetes mellitus with right diabetic foot ulcer ([place]) [Y65.035, L97.519]  1. Acute kidney injury on chronic kidney disease stage IV with proteinuria and history of hyperkalemia: Baseline creatinine of 1.69, GFR of 32 on 04/02/2020.  Chronic kidney disease secondary to diabetic nephropathy and hypertension.  Acute kidney injury from IV contrast nephropathy. Angiogram with 70cc of IV dye used on 2/11.  -  IV fluids: NS at 13mL/hr - monitor volume status - No acute indication for dialysis.  - not currently on diuretics or ACE-I/ARB  2. Hypertension: blood pressure at goal. On carvedilol and amlodipine.   3. Diabetes mellitus type II: with chronic kidney disease: insulin dependent - Check hemoglobin A1c  4. Diabetic foot wound: left second toe with ulceration. With peripheral vascular disease - Appreciate podiatry and vascular input.    LOS: 6 Yi Falletta 2/15/20222:09 PM

## 2020-05-23 NOTE — Progress Notes (Signed)
PHARMACY NOTE:  ANTIMICROBIAL RENAL DOSAGE ADJUSTMENT  Current antimicrobial regimen includes a mismatch between antimicrobial dosage and estimated renal function.  As per policy approved by the Pharmacy & Therapeutics and Medical Executive Committees, the antimicrobial dosage will be adjusted accordingly.  Current antimicrobial dosage:  Unasyn 3 gm IV q8h  Indication: wound infection  Renal Function: Estimated Creatinine Clearance: 15.6 mL/min (A) (by C-G formula based on SCr of 3.71 mg/dL (H)).     Antimicrobial dosage has been changed to:  Unasyn 3 gm IV q12h  (For Crcl 15-29 ml/min)  Additional comments:   Thank you for allowing pharmacy to be a part of this patient's care.  Chinita Greenland PharmD Clinical Pharmacist 05/23/2020

## 2020-05-24 ENCOUNTER — Encounter
Admission: EM | Disposition: A | Discharge status: Home Health | Payer: Self-pay | Source: Home / Self Care | Attending: Internal Medicine

## 2020-05-24 DIAGNOSIS — L089 Local infection of the skin and subcutaneous tissue, unspecified: Secondary | ICD-10-CM | POA: Diagnosis not present

## 2020-05-24 DIAGNOSIS — E11628 Type 2 diabetes mellitus with other skin complications: Secondary | ICD-10-CM | POA: Diagnosis not present

## 2020-05-24 LAB — CBC
HCT: 24.3 % — ABNORMAL LOW (ref 36.0–46.0)
Hemoglobin: 8 g/dL — ABNORMAL LOW (ref 12.0–15.0)
MCH: 30.7 pg (ref 26.0–34.0)
MCHC: 32.9 g/dL (ref 30.0–36.0)
MCV: 93.1 fL (ref 80.0–100.0)
Platelets: 184 10*3/uL (ref 150–400)
RBC: 2.61 MIL/uL — ABNORMAL LOW (ref 3.87–5.11)
RDW: 14 % (ref 11.5–15.5)
WBC: 7.9 10*3/uL (ref 4.0–10.5)
nRBC: 0 % (ref 0.0–0.2)

## 2020-05-24 LAB — CREATININE, SERUM
Creatinine, Ser: 3.17 mg/dL — ABNORMAL HIGH (ref 0.44–1.00)
GFR, Estimated: 15 mL/min — ABNORMAL LOW (ref >60)

## 2020-05-24 LAB — HEPATITIS B CORE ANTIBODY, IGM: Hep B C IgM: NONREACTIVE

## 2020-05-24 LAB — HEPATIC FUNCTION PANEL
ALT: 8 U/L (ref 0–44)
AST: 15 U/L (ref 15–41)
Albumin: 2.3 g/dL — ABNORMAL LOW (ref 3.5–5.0)
Alkaline Phosphatase: 51 U/L (ref 38–126)
Bilirubin, Direct: <0.1 mg/dL (ref 0.0–0.2)
Total Bilirubin: 0.7 mg/dL (ref 0.3–1.2)
Total Protein: 6.7 g/dL (ref 6.5–8.1)

## 2020-05-24 LAB — IRON AND TIBC
Iron: 28 ug/dL (ref 28–170)
Saturation Ratios: 24 % (ref 10.4–31.8)
TIBC: 118 ug/dL — ABNORMAL LOW (ref 250–450)
UIBC: 90 ug/dL

## 2020-05-24 LAB — GLUCOSE, CAPILLARY
Glucose-Capillary: 106 mg/dL — ABNORMAL HIGH (ref 70–99)
Glucose-Capillary: 129 mg/dL — ABNORMAL HIGH (ref 70–99)
Glucose-Capillary: 130 mg/dL — ABNORMAL HIGH (ref 70–99)
Glucose-Capillary: 135 mg/dL — ABNORMAL HIGH (ref 70–99)

## 2020-05-24 LAB — HEPATITIS C ANTIBODY: HCV Ab: NONREACTIVE

## 2020-05-24 LAB — FERRITIN: Ferritin: 149 ng/mL (ref 11–307)

## 2020-05-24 SURGERY — AMPUTATION, TOE
Anesthesia: Choice | Site: Toe | Laterality: Right

## 2020-05-24 NOTE — Progress Notes (Signed)
   05/24/20 1950  Clinical Encounter Type  Visited With Patient  Visit Type Initial;Spiritual support;Social support  Referral From Nurse  Consult/Referral To Chaplain  Responded to [facility] from nurse. Pt was alert and talking. 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 Pt also said she needed all the prayer she can get. I prayed for the Pt. Ch will follow up as needed.

## 2020-05-24 NOTE — Progress Notes (Signed)
PROGRESS NOTE    [name] [name]  QMV:[id number] DOB: 1945/06/10 DOA: 05/17/2020 PCP: Marguerita Merles, MD    Brief Narrative:  [name] [name] a 75 y.o.femalewith medical history significant ofDM, HTN, HLD, CKD-IV, hypothyroidism, gout,whopresents with foot ulcer with infection in right second toe.  Pt states that she noted a ulcer in dorsalaspects ofrightsecondarytoe about1 week ago, which has been progressively worsening.Shenotedlittle pus drainage.  2/16-creatinine down to 3.17    Consultants:   Lower, podiatry, nephrology  Procedures:   Antimicrobials:   Unasyn   Subjective: Has no pain, shortness of breath or chest pain.  Objective: Vitals:   05/24/20 0635 05/24/20 0833 05/24/20 1148 05/24/20 1554  BP: 106/69 127/63 (!) 110/58 (!) 131/58  Pulse: 63 (!) 50 61 73  Resp: 18 18 17 17   Temp: 98.3 F (36.8 C) 98.3 F (36.8 C) 98.6 F (37 C) 98.3 F (36.8 C)  TempSrc: Oral     SpO2: 98% 100% 99% 99%  Weight:      Height:        Intake/Output Summary (Last 24 hours) at 05/24/2020 1634 Last data filed at 05/24/2020 1405 Gross per 24 hour  Intake 1097.38 ml  Output 0 ml  Net 1097.38 ml   Filed Weights   05/17/20 1201  Weight: 90 kg    Examination:  General exam: Appears calm and comfortable  Respiratory system: Clear to auscultation. Respiratory effort normal. Cardiovascular system: S1 & S2 heard, RRR. No JVD, murmurs, rubs, gallops or clicks.  Gastrointestinal system: Abdomen is nondistended, soft and nontender.Normal bowel sounds heard. Central nervous system: Alert and oriented.  Grossly intact Extremities: No edema Psychiatry: Judgement and insight appear normal. Mood & affect appropriate.     Data Reviewed: I have personally reviewed following labs and imaging studies  CBC: Recent Labs  Lab 05/18/20 0541 05/23/20 0422  WBC 7.7 8.1  HGB 9.4* 7.9*  HCT 29.2* 25.1*  MCV 93.0 93.7  PLT 215 284   Basic Metabolic Panel: Recent  Labs  Lab 05/18/20 0541 05/19/20 0524 05/23/20 0422 05/24/20 0508  NA 136 139 135  --   K 3.9 3.9 4.2  --   CL 103 104 103  --   CO2 25 22 20*  --   GLUCOSE 118* 102* 106*  --   BUN 36* 37* 48*  --   CREATININE 1.96* 1.98* 3.71* 3.17*  CALCIUM 8.7* 8.6* 7.8*  --    GFR: Estimated Creatinine Clearance: 18.2 mL/min (A) (by C-G formula based on SCr of 3.17 mg/dL (H)). Liver Function Tests: Recent Labs  Lab 05/24/20 0508  AST 15  ALT 8  ALKPHOS 51  BILITOT 0.7  PROT 6.7  ALBUMIN 2.3*   No results for input(s): LIPASE, AMYLASE in the last 168 hours. No results for input(s): AMMONIA in the last 168 hours. Coagulation Profile: No results for input(s): INR, PROTIME in the last 168 hours. Cardiac Enzymes: No results for input(s): CKTOTAL, CKMB, CKMBINDEX, TROPONINI in the last 168 hours. BNP (last 3 results) No results for input(s): PROBNP in the last 8760 hours. HbA1C: No results for input(s): HGBA1C in the last 72 hours. CBG: Recent Labs  Lab 05/23/20 1621 05/23/20 2314 05/24/20 0834 05/24/20 1149 05/24/20 1602  GLUCAP 103* 95 106* 129* 130*   Lipid Profile: No results for input(s): CHOL, HDL, LDLCALC, TRIG, CHOLHDL, LDLDIRECT in the last 72 hours. Thyroid Function Tests: No results for input(s): TSH, T4TOTAL, FREET4, T3FREE, THYROIDAB in the last 72 hours. Anemia Panel:  Recent Labs    05/24/20 0508  FERRITIN 149  TIBC 118*  IRON 28   Sepsis Labs: No results for input(s): PROCALCITON, LATICACIDVEN in the last 168 hours.  Recent Results (from the past 240 hour(s))  SARS CORONAVIRUS 2 (TAT 6-24 HRS) Nasopharyngeal Nasopharyngeal Swab     Status: None   Collection Time: 05/17/20  4:32 PM   Specimen: Nasopharyngeal Swab  Result Value Ref Range Status   SARS Coronavirus 2 NEGATIVE NEGATIVE Final    Comment: (NOTE) SARS-CoV-2 target nucleic acids are NOT DETECTED.  The SARS-CoV-2 RNA is generally detectable in upper and lower respiratory specimens during  the acute phase of infection. Negative results do not preclude SARS-CoV-2 infection, do not rule out co-infections with other pathogens, and should not be used as the sole basis for treatment or other patient management decisions. Negative results must be combined with clinical observations, patient history, and epidemiological information. The expected result is Negative.  Fact Sheet for Patients: SugarRoll.be  Fact Sheet for Healthcare Providers: https://www.woods-mathews.com/  This test is not yet approved or cleared by the [place] FDA and  has been authorized for detection and/or diagnosis of SARS-CoV-2 by FDA under an Emergency Use Authorization (EUA). This EUA will remain  in effect (meaning this test can be used) for the duration of the COVID-19 declaration under Se ction 564(b)(1) of the Act, 21 U.S.C. section 360bbb-3(b)(1), unless the authorization is terminated or revoked sooner.  Performed at [facility], [place] [address], [place], [place] [address]   CULTURE, BLOOD (ROUTINE X 2) w Reflex to ID Panel     Status: None   Collection Time: 05/17/20  5:04 PM   Specimen: BLOOD  Result Value Ref Range Status   Specimen Description BLOOD BLOOD LEFT ARM  Final   Special Requests   Final    BOTTLES DRAWN AEROBIC AND ANAEROBIC Blood Culture results may not be optimal due to an excessive volume of blood received in culture bottles   Culture   Final    NO GROWTH 5 DAYS Performed at [facility], [address] [place], [place] [address]    Report Status 05/22/2020 FINAL  Final  CULTURE, BLOOD (ROUTINE X 2) w Reflex to ID Panel     Status: None   Collection Time: 05/17/20  5:04 PM   Specimen: BLOOD  Result Value Ref Range Status   Specimen Description BLOOD RIGHT ANTECUBITAL  Final   Special Requests   Final    BOTTLES DRAWN AEROBIC AND ANAEROBIC Blood Culture results may not be optimal due to an excessive volume  of blood received in culture bottles   Culture   Final    NO GROWTH 5 DAYS Performed at [facility], [address], [place], [place] [address]    Report Status 05/22/2020 FINAL  Final         Radiology Studies: No results found.      Scheduled Meds: . amLODipine  10 mg Oral Daily  . aspirin  81 mg Oral Daily  . atorvastatin  40 mg Oral Daily  . carvedilol  25 mg Oral BID  . donepezil  10 mg Oral QHS  . heparin  5,000 Units Subcutaneous Q8H  . insulin aspart  0-5 Units Subcutaneous QHS  . insulin aspart  0-9 Units Subcutaneous TID WC  . levothyroxine  50 mcg Oral Q0600  . sodium chloride flush  3 mL Intravenous Q12H   Continuous Infusions: . sodium chloride 10 mL/hr  at 05/22/20 0425  . sodium chloride 75 mL/hr at 05/24/20 0319  . ampicillin-sulbactam (UNASYN) IV 3 g (05/24/20 0533)    Assessment & Plan:   Principal Problem:   Diabetic foot infection_right second toe  Active Problems:   HTN (hypertension)   Hypothyroidism (acquired)   Hyperlipidemia   CKD (chronic kidney disease), stage IV (HCC)   Type II diabetes mellitus with renal manifestations ([place])   GERD (gastroesophageal reflux disease)   Thomasina Housley Brownis a 75 y.o.femalewith medical history significant ofDM, HTN, HLD, CKD-IV, hypothyroidism, gout,whopresents with foot ulcer with infection in right second toe.  Pt states that she noted a ulcer in dorsalaspects ofrightsecondarytoe about1 week ago, which has been progressively worsening.Shenotedlittle pus drainage.  Diabetic footulcer withinfection right second ZJQ:BHALPFX does not have fever or leukocytosis. Clinically not septic. Hemodynamically stable. Dr. Cleda Mccreedy of podiatry, Dr. Delana Meyer of [facility]are consulted. 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 Normal arterial  waveforms at the level of the ankle. --Patient seen by vascular surgery Dr. Delana Meyer  --2/11-- revascularization unsuccessful. Repeat plan for next Tuesday -- podiatry consult with Dr. Vickki Muff appreciated plans for possible amputation Sometime next week after second attempt for revascularization --2/15-- vascular procedure for second attempt to revascularization canceled today due to elevated creatinine 2/16-podiatry planning for surgery debidment/amputation  but held off until vascular revascularization completed and awaiting renal function stablization.  HTN (hypertension) Stable Continue amlodipine, Coreg IV hydralazine as needed     Hypothyroidism (acquired) Continue Synthroid  Hyperlipidemia Continue Lipitor  Acute on CKD (chronic kidney disease), stage IV ([place]): suspected contrast induced nephropathy Baseline creatinine 1.6-1.9 recently. - Her creatinine is 1.96 --angiogram---1.98--3.17 Nephrology following, started on IV fluids.  Creatinine mildly down to 3.17 today Continue monitoring levels  Type II diabetes mellitus with renal manifestations [facility]): Recent A1c 7.0, not well controlled. -Sliding scale insulin -Hold Lantus due to low sugars  GERD (gastroesophageal reflux disease) -Protonix  At baseline patient has been bedbound for more than seven months. She is taken care of by son and other family members at home.    DVT prophylaxis: Heparin Code Status: Full Family Communication: None at bedside  Status is: Inpatient  Remains inpatient appropriate because:Inpatient level of care appropriate due to severity of illness   Dispo: The patient is from: Home              Anticipated d/c is to: TBD              Anticipated d/c date is: > 3 days              Patient currently is not medically stable to d/c.   Difficult to place patient No  Patient remains in-house secondary to diabetic foot ulcer with possible osteomyelitis.  Status post angiogram with  unsuccessful attempt to revascularization.  Creatinine was elevated now getting IV fluids for suspected contrast-induced nephropathy.  Plan for more work-up and possible amputation after creatinine stabilized.            LOS: 7 days   Time spent: 35 min with >50% on coc    Nolberto Hanlon, MD [facility] Pager [phone]  If 7PM-7AM, please contact night-coverage 05/24/2020, 4:34 PM

## 2020-05-24 NOTE — Progress Notes (Signed)
[facility]  ROUNDING NOTE   Subjective:   [name] admitted to [facility] on 05/17/2020 for Pain [R52] Diabetic foot infection ([place]) [E11.628, L08.9] Type 2 diabetes mellitus with right diabetic foot ulcer ([place]) [O24.235, L97.519]  Patient underwent angiogram of left lower extremity on 2/11 by Dr. Delana [name].  Creatinine 1.88 with GFR of 28 on admission. Creatinine 3.71 today and nephrology consulted.   Patient seen resting in bed Alert and oriented Denies shortness of breath and chest pain Able to eat meals without nausea She is currently upset with social concerns  IVF-NS @75   Objective:  Vital signs in last 24 hours:  Temp:  [98.3 F (36.8 C)-98.7 F (37.1 C)] 98.6 F (37 C) (02/16 1148) Pulse Rate:  [50-72] 61 (02/16 1148) Resp:  [16-18] 17 (02/16 1148) BP: (106-127)/(58-69) 110/58 (02/16 1148) SpO2:  [98 %-100 %] 99 % (02/16 1148)  Weight change:  Filed Weights   05/17/20 1201  Weight: 90 kg    Intake/Output: I/O last 3 completed shifts: In: 973.1 [P.O.:120; I.V.:753.1; IV Piggyback:100] Out: 200 [Urine:200]   Intake/Output this shift:  Total I/O In: 237 [P.O.:237] Out: -   Physical Exam: General: NAD,   Head: Normocephalic, atraumatic. Moist oral mucosal membranes  Eyes: Anicteric  Neck: Supple, trachea midline  Lungs:  Clear to auscultation  Heart: Regular rate and rhythm  Abdomen:  Soft, nontender, obese  Extremities:  trace peripheral edema. Left foot in dressings  Neurologic: Nonfocal, moving all four extremities  Skin: No lesions        Basic Metabolic Panel: Recent Labs  Lab 05/18/20 0541 05/19/20 0524 05/23/20 0422 05/24/20 0508  NA 136 139 135  --   K 3.9 3.9 4.2  --   CL 103 104 103  --   CO2 25 22 20*  --   GLUCOSE 118* 102* 106*  --   BUN 36* 37* 48*  --   CREATININE 1.96* 1.98* 3.71* 3.17*  CALCIUM 8.7* 8.6* 7.8*  --     Liver Function Tests: Recent Labs  Lab 05/24/20 0508  AST 15  ALT 8  ALKPHOS 51   BILITOT 0.7  PROT 6.7  ALBUMIN 2.3*   No results for input(s): LIPASE, AMYLASE in the last 168 hours. No results for input(s): AMMONIA in the last 168 hours.  CBC: Recent Labs  Lab 05/18/20 0541 05/23/20 0422  WBC 7.7 8.1  HGB 9.4* 7.9*  HCT 29.2* 25.1*  MCV 93.0 93.7  PLT 215 185    Cardiac Enzymes: No results for input(s): CKTOTAL, CKMB, CKMBINDEX, TROPONINI in the last 168 hours.  BNP: Invalid input(s): POCBNP  CBG: Recent Labs  Lab 05/23/20 1136 05/23/20 1621 05/23/20 2314 05/24/20 0834 05/24/20 1149  GLUCAP 96 103* 95 106* 129*    Microbiology: Results for orders placed or performed during the hospital encounter of 05/17/20  SARS CORONAVIRUS 2 (TAT 6-24 HRS) Nasopharyngeal Nasopharyngeal Swab     Status: None   Collection Time: 05/17/20  4:32 PM   Specimen: Nasopharyngeal Swab  Result Value Ref Range Status   SARS Coronavirus 2 NEGATIVE NEGATIVE Final    Comment: (NOTE) SARS-CoV-2 target nucleic acids are NOT DETECTED.  The SARS-CoV-2 RNA is generally detectable in upper and lower respiratory specimens during the acute phase of infection. Negative results do not preclude SARS-CoV-2 infection, do not rule out co-infections with other pathogens, and should not be used as the sole basis for treatment or other patient management decisions. Negative results must be  combined with clinical observations, patient history, and epidemiological information. The expected result is Negative.  Fact Sheet for Patients: SugarRoll.be  Fact Sheet for Healthcare Providers: https://www.woods-mathews.com/  This test is not yet approved or cleared by the [place] FDA and  has been authorized for detection and/or diagnosis of SARS-CoV-2 by FDA under an Emergency Use Authorization (EUA). This EUA will remain  in effect (meaning this test can be used) for the duration of the COVID-19 declaration under Se ction 564(b)(1) of the  Act, 21 U.S.C. section 360bbb-3(b)(1), unless the authorization is terminated or revoked sooner.  Performed at [facility], [place] [address], [place], [place] [address]   CULTURE, BLOOD (ROUTINE X 2) w Reflex to ID Panel     Status: None   Collection Time: 05/17/20  5:04 PM   Specimen: BLOOD  Result Value Ref Range Status   Specimen Description BLOOD BLOOD LEFT ARM  Final   Special Requests   Final    BOTTLES DRAWN AEROBIC AND ANAEROBIC Blood Culture results may not be optimal due to an excessive volume of blood received in culture bottles   Culture   Final    NO GROWTH 5 DAYS Performed at [facility], [address] [place], [place] [address]    Report Status 05/22/2020 FINAL  Final  CULTURE, BLOOD (ROUTINE X 2) w Reflex to ID Panel     Status: None   Collection Time: 05/17/20  5:04 PM   Specimen: BLOOD  Result Value Ref Range Status   Specimen Description BLOOD RIGHT ANTECUBITAL  Final   Special Requests   Final    BOTTLES DRAWN AEROBIC AND ANAEROBIC Blood Culture results may not be optimal due to an excessive volume of blood received in culture bottles   Culture   Final    NO GROWTH 5 DAYS Performed at [facility], [address], [place], [place] [address]    Report Status 05/22/2020 FINAL  Final    Coagulation Studies: No results for input(s): LABPROT, INR in the last 72 hours.  Urinalysis: No results for input(s): COLORURINE, LABSPEC, PHURINE, GLUCOSEU, HGBUR, BILIRUBINUR, KETONESUR, PROTEINUR, UROBILINOGEN, NITRITE, LEUKOCYTESUR in the last 72 hours.  Invalid input(s): APPERANCEUR    Imaging: No results found.   Medications:   . sodium chloride 10 mL/hr at 05/22/20 0425  . sodium chloride 75 mL/hr at 05/24/20 0319  . ampicillin-sulbactam (UNASYN) IV 3 g (05/24/20 0533)   . amLODipine  10 mg Oral Daily  . aspirin  81 mg Oral Daily  . atorvastatin  40 mg Oral Daily  . carvedilol  25 mg Oral BID  . donepezil  10 mg Oral QHS  .  heparin  5,000 Units Subcutaneous Q8H  . insulin aspart  0-5 Units Subcutaneous QHS  . insulin aspart  0-9 Units Subcutaneous TID WC  . levothyroxine  50 mcg Oral Q0600  . sodium chloride flush  3 mL Intravenous Q12H   sodium chloride, acetaminophen, bisacodyl, hydrALAZINE, HYDROmorphone (DILAUDID) injection, ondansetron (ZOFRAN) IV, polyethylene glycol, sodium chloride flush  Assessment/ Plan:  [name] is a 75 y.o. black female with hypothyroidism, hypertension, hyperlipidemia, peripheral vascular disease, diabetes mellitus type II insulin dependent who was admitted to [facility] on 05/17/2020 for Pain [R52] Diabetic foot infection ([place]) [P53.614, L08.9] Type 2 diabetes mellitus with right diabetic foot ulcer ([place]) [E31.540, L97.519]  1. Acute kidney injury on chronic kidney disease stage IV with proteinuria and history of hyperkalemia: Baseline creatinine of 1.69, GFR  of 32 on 04/02/2020.  Chronic kidney disease secondary to diabetic nephropathy and hypertension.  Acute kidney injury from IV contrast nephropathy. Angiogram with 70cc of IV dye used on 2/11.  - IV fluids: NS at 71mL/hr - monitor volume status -Creatinine continues to improve -Crt level 3.17  2. Hypertension: Blood pressure at goal.  Prescribed carvedilol and amlodipine.   3. Diabetes mellitus type II: with chronic kidney disease: insulin dependent - awaiting Hgb A1c results  4. Diabetic foot wound: left second toe with ulceration. With peripheral vascular disease - Appreciate podiatry and vascular input.    LOS: 7   2/16/202212:22 PM

## 2020-05-25 DIAGNOSIS — E11628 Type 2 diabetes mellitus with other skin complications: Secondary | ICD-10-CM | POA: Diagnosis not present

## 2020-05-25 DIAGNOSIS — L089 Local infection of the skin and subcutaneous tissue, unspecified: Secondary | ICD-10-CM | POA: Diagnosis not present

## 2020-05-25 LAB — RENAL FUNCTION PANEL
Albumin: 2.2 g/dL — ABNORMAL LOW (ref 3.5–5.0)
Anion gap: 10 (ref 5–15)
BUN: 33 mg/dL — ABNORMAL HIGH (ref 8–23)
CO2: 18 mmol/L — ABNORMAL LOW (ref 22–32)
Calcium: 7.9 mg/dL — ABNORMAL LOW (ref 8.9–10.3)
Chloride: 110 mmol/L (ref 98–111)
Creatinine, Ser: 2.43 mg/dL — ABNORMAL HIGH (ref 0.44–1.00)
GFR, Estimated: 20 mL/min — ABNORMAL LOW (ref >60)
Glucose, Bld: 83 mg/dL (ref 70–99)
Phosphorus: 3.7 mg/dL (ref 2.5–4.6)
Potassium: 3.6 mmol/L (ref 3.5–5.1)
Sodium: 138 mmol/L (ref 135–145)

## 2020-05-25 LAB — HEMOGLOBIN A1C
Hgb A1c MFr Bld: 6.1 % — ABNORMAL HIGH (ref 4.8–5.6)
Mean Plasma Glucose: 128 mg/dL

## 2020-05-25 LAB — GLUCOSE, CAPILLARY
Glucose-Capillary: 91 mg/dL (ref 70–99)
Glucose-Capillary: 142 mg/dL — ABNORMAL HIGH (ref 70–99)
Glucose-Capillary: 98 mg/dL (ref 70–99)
Glucose-Capillary: 131 mg/dL — ABNORMAL HIGH (ref 70–99)

## 2020-05-25 NOTE — Progress Notes (Signed)
PROGRESS NOTE    [name] [name]  CVE:[id number] DOB: 1945-07-26 DOA: 05/17/2020 PCP: Marguerita Merles, MD    Brief Narrative:  [name] [name] a 75 y.o.femalewith medical history significant ofDM, HTN, HLD, CKD-IV, hypothyroidism, gout,whopresents with foot ulcer with infection in right second toe.  Pt states that she noted a ulcer in dorsalaspects ofrightsecondarytoe about1 week ago, which has been progressively worsening.Shenotedlittle pus drainage.  2/16-creatinine down to 3.17 /17 creatinine improving, today 2.43  Consultants:   Lower, podiatry, nephrology  Procedures:   Antimicrobials:   Unasyn   Subjective: No complaints this a.m.  Objective: Vitals:   05/24/20 2120 05/25/20 0044 05/25/20 0535 05/25/20 0748  BP: (!) 135/50 (!) 112/59 (!) 129/93 138/65  Pulse: (!) 56 (!) 51 (!) 58 (!) 59  Resp: 17 17 17 16   Temp: 97.7 F (36.5 C) 97.8 F (36.6 C) 98 F (36.7 C) 98.2 F (36.8 C)  TempSrc:  Oral Oral Oral  SpO2: 100% 100% 100% 100%  Weight:      Height:        Intake/Output Summary (Last 24 hours) at 05/25/2020 0815 Last data filed at 05/24/2020 1405 Gross per 24 hour  Intake 477 ml  Output -  Net 477 ml   Filed Weights   05/17/20 1201  Weight: 90 kg    Examination: Pleasant, calm CTA, no W/R/RRR Regular S1-S2 no gallops Soft benign positive bowel sounds No edema Alert oriented x3.     Data Reviewed: I have personally reviewed following labs and imaging studies  CBC: Recent Labs  Lab 05/23/20 0422 05/24/20 1732  WBC 8.1 7.9  HGB 7.9* 8.0*  HCT 25.1* 24.3*  MCV 93.7 93.1  PLT 185 025   Basic Metabolic Panel: Recent Labs  Lab 05/19/20 0524 05/23/20 0422 05/24/20 0508 05/25/20 0515  NA 139 135  --  138  K 3.9 4.2  --  3.6  CL 104 103  --  110  CO2 22 20*  --  18*  GLUCOSE 102* 106*  --  83  BUN 37* 48*  --  33*  CREATININE 1.98* 3.71* 3.17* 2.43*  CALCIUM 8.6* 7.8*  --  7.9*  PHOS  --   --   --  3.7    GFR: Estimated Creatinine Clearance: 23.8 mL/min (A) (by C-G formula based on SCr of 2.43 mg/dL (H)). Liver Function Tests: Recent Labs  Lab 05/24/20 0508 05/25/20 0515  AST 15  --   ALT 8  --   ALKPHOS 51  --   BILITOT 0.7  --   PROT 6.7  --   ALBUMIN 2.3* 2.2*   No results for input(s): LIPASE, AMYLASE in the last 168 hours. No results for input(s): AMMONIA in the last 168 hours. Coagulation Profile: No results for input(s): INR, PROTIME in the last 168 hours. Cardiac Enzymes: No results for input(s): CKTOTAL, CKMB, CKMBINDEX, TROPONINI in the last 168 hours. BNP (last 3 results) No results for input(s): PROBNP in the last 8760 hours. HbA1C: Recent Labs    05/24/20 0508  HGBA1C 6.1*   CBG: Recent Labs  Lab 05/24/20 0834 05/24/20 1149 05/24/20 1602 05/24/20 2118 05/25/20 0749  GLUCAP 106* 129* 130* 135* 91   Lipid Profile: No results for input(s): CHOL, HDL, LDLCALC, TRIG, CHOLHDL, LDLDIRECT in the last 72 hours. Thyroid Function Tests: No results for input(s): TSH, T4TOTAL, FREET4, T3FREE, THYROIDAB in the last 72 hours. Anemia Panel: Recent Labs    05/24/20 0508  FERRITIN 149  TIBC  118*  IRON 28   Sepsis Labs: No results for input(s): PROCALCITON, LATICACIDVEN in the last 168 hours.  Recent Results (from the past 240 hour(s))  SARS CORONAVIRUS 2 (TAT 6-24 HRS) Nasopharyngeal Nasopharyngeal Swab     Status: None   Collection Time: 05/17/20  4:32 PM   Specimen: Nasopharyngeal Swab  Result Value Ref Range Status   SARS Coronavirus 2 NEGATIVE NEGATIVE Final    Comment: (NOTE) SARS-CoV-2 target nucleic acids are NOT DETECTED.  The SARS-CoV-2 RNA is generally detectable in upper and lower respiratory specimens during the acute phase of infection. Negative results do not preclude SARS-CoV-2 infection, do not rule out co-infections with other pathogens, and should not be used as the sole basis for treatment or other patient management  decisions. Negative results must be combined with clinical observations, patient history, and epidemiological information. The expected result is Negative.  Fact Sheet for Patients: SugarRoll.be  Fact Sheet for Healthcare Providers: https://www.woods-mathews.com/  This test is not yet approved or cleared by the [place] FDA and  has been authorized for detection and/or diagnosis of SARS-CoV-2 by FDA under an Emergency Use Authorization (EUA). This EUA will remain  in effect (meaning this test can be used) for the duration of the COVID-19 declaration under Se ction 564(b)(1) of the Act, 21 U.S.C. section 360bbb-3(b)(1), unless the authorization is terminated or revoked sooner.  Performed at [facility], [place] [address] [place], [place] [address]   CULTURE, BLOOD (ROUTINE X 2) w Reflex to ID Panel     Status: None   Collection Time: 05/17/20  5:04 PM   Specimen: BLOOD  Result Value Ref Range Status   Specimen Description BLOOD BLOOD LEFT ARM  Final   Special Requests   Final    BOTTLES DRAWN AEROBIC AND ANAEROBIC Blood Culture results may not be optimal due to an excessive volume of blood received in culture bottles   Culture   Final    NO GROWTH 5 DAYS Performed at [facility], [address] [place], [place] [address]    Report Status 05/22/2020 FINAL  Final  CULTURE, BLOOD (ROUTINE X 2) w Reflex to ID Panel     Status: None   Collection Time: 05/17/20  5:04 PM   Specimen: BLOOD  Result Value Ref Range Status   Specimen Description BLOOD RIGHT ANTECUBITAL  Final   Special Requests   Final    BOTTLES DRAWN AEROBIC AND ANAEROBIC Blood Culture results may not be optimal due to an excessive volume of blood received in culture bottles   Culture   Final    NO GROWTH 5 DAYS Performed at [facility], [address] [place], [place] [address]    Report Status 05/22/2020 FINAL  Final          Radiology Studies: No results found.      Scheduled Meds: . amLODipine  10 mg Oral Daily  . aspirin  81 mg Oral Daily  . atorvastatin  40 mg Oral Daily  . carvedilol  25 mg Oral BID  . donepezil  10 mg Oral QHS  . heparin  5,000 Units Subcutaneous Q8H  . insulin aspart  0-5 Units Subcutaneous QHS  . insulin aspart  0-9 Units Subcutaneous TID WC  . levothyroxine  50 mcg Oral Q0600  . sodium chloride flush  3 mL Intravenous Q12H   Continuous Infusions: . sodium chloride 10 mL/hr at 05/22/20 0425  . sodium chloride 75 mL/hr at 05/24/20 0319  .  ampicillin-sulbactam (UNASYN) IV 3 g (05/25/20 0542)    Assessment & Plan:   Principal Problem:   Diabetic foot infection_right second toe  Active Problems:   HTN (hypertension)   Hypothyroidism (acquired)   Hyperlipidemia   CKD (chronic kidney disease), stage IV (HCC)   Type II diabetes mellitus with renal manifestations ([place])   GERD (gastroesophageal reflux disease)   [name] [name] a 75 y.o.femalewith medical history significant ofDM, HTN, HLD, CKD-IV, hypothyroidism, gout,whopresents with foot ulcer with infection in right second toe.  Pt states that she noted a ulcer in dorsalaspects ofrightsecondarytoe about1 week ago, which has been progressively worsening.Shenotedlittle pus drainage.  Diabetic footulcer withinfection right second LKT:GYBWLSL does not have fever or leukocytosis. Clinically not septic. Hemodynamically stable. Dr. Cleda Mccreedy of podiatry, Dr. Delana Meyer of [facility] consulted. 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 Normal arterial waveforms at the level of the ankle. --Patient seen by vascular surgery Dr. Delana Meyer  --2/11-- revascularization unsuccessful. Repeat plan for next Tuesday -- podiatry consult with Dr. Vickki Muff appreciated  plans for possible amputation Sometime next week after second attempt for revascularization --2/15-- vascular procedure for second attempt to revascularization canceled today due to elevated creatinine 2/17-podiatry planning for surgery debridement/amputation but held off until vascular revascularization completed and also awaiting renal function stabilization first  HTN (hypertension) Stable  Continue amlodipine, Coreg  IV hydralazine as needed    Hypothyroidism (acquired) Continue Synthroid  Hyperlipidemia Continue Lipitor  Acute on CKD (chronic kidney disease), stage IV ([place]): suspected contrast induced nephropathy Baseline creatinine 1.6-1.9 recently. - Her creatinine is 1.96 --angiogram---1.98--3.17>>>2.42 2/17-improving with IVF Nephrology following-continue ivf 68ml/hr for another day.  Monitor renal levels   Type II diabetes mellitus with renal manifestations [facility]): Recent A1c 7.0, not well controlled. -Sliding scale insulin -Hold Lantus due to low sugars  GERD (gastroesophageal reflux disease) -Protonix  At baseline patient has been bedbound for more than seven months. She is taken care of by son and other family members at home.    DVT prophylaxis: Heparin Code Status: Full Family Communication: None at bedside  Status is: Inpatient  Remains inpatient appropriate because:Inpatient level of care appropriate due to severity of illness   Dispo: The patient is from: Home              Anticipated d/c is to: TBD              Anticipated d/c date is: > 3 days              Patient currently is not medically stable to d/c.   Difficult to place patient No  Patient remains in-house secondary to diabetic foot ulcer with possible osteomyelitis.  Status post angiogram with unsuccessful attempt to revascularization.  Creatinine was elevated now getting IV fluids for suspected contrast-induced nephropathy.  Plan for more work-up and possible amputation after  creatinine stabilized.            LOS: 8 days   Time spent: 35 min with >50% on coc    Nolberto Hanlon, MD [facility] Pager [phone]  If 7PM-7AM, please contact night-coverage 05/25/2020, 8:15 AM

## 2020-05-25 NOTE — Progress Notes (Signed)
[facility]  ROUNDING NOTE   Subjective:   [name] admitted to [facility] on 05/17/2020 for Pain [R52] Diabetic foot infection ([place]) [E11.628, L08.9] Type 2 diabetes mellitus with right diabetic foot ulcer ([place]) [D32.202, L97.519]  Patient underwent angiogram of left lower extremity on 2/11 by Dr. Delana Meyer.  Creatinine 1.88 with GFR of 28 on admission. Creatinine 3.71 today and nephrology consulted.   Patient complaining of "grabbing" sensation in her lower back States it hurts and is occurring more frequent Alert and oriented Denies shortness of breath and chest pain Able to eat meals without nausea Son at bedside  IVF-NS @75   Objective:  Vital signs in last 24 hours:  Temp:  [97.7 F (36.5 C)-100.2 F (37.9 C)] 100.2 F (37.9 C) (02/17 1141) Pulse Rate:  [51-73] 52 (02/17 1141) Resp:  [15-17] 15 (02/17 1141) BP: (112-138)/(49-93) 123/49 (02/17 1141) SpO2:  [99 %-100 %] 100 % (02/17 1141)  Weight change:  Filed Weights   05/17/20 1201  Weight: 90 kg    Intake/Output: I/O last 3 completed shifts: In: 1097.4 [P.O.:477; I.V.:520.4; IV Piggyback:100] Out: 0    Intake/Output this shift:  No intake/output data recorded.  Physical Exam: General: NAD,   Head: Normocephalic, atraumatic. Moist oral mucosal membranes  Eyes: Anicteric  Neck: Supple, trachea midline  Lungs:  Clear to auscultation  Heart: Regular rate and rhythm  Abdomen:  Soft, nontender, obese  Extremities:  trace peripheral edema. Left foot in dressings  Neurologic: Nonfocal, moving all four extremities  Skin: No lesions        Basic Metabolic Panel: Recent Labs  Lab 05/19/20 0524 05/23/20 0422 05/24/20 0508 05/25/20 0515  NA 139 135  --  138  K 3.9 4.2  --  3.6  CL 104 103  --  110  CO2 22 20*  --  18*  GLUCOSE 102* 106*  --  83  BUN 37* 48*  --  33*  CREATININE 1.98* 3.71* 3.17* 2.43*  CALCIUM 8.6* 7.8*  --  7.9*  PHOS  --   --   --  3.7    Liver Function  Tests: Recent Labs  Lab 05/24/20 0508 05/25/20 0515  AST 15  --   ALT 8  --   ALKPHOS 51  --   BILITOT 0.7  --   PROT 6.7  --   ALBUMIN 2.3* 2.2*   No results for input(s): LIPASE, AMYLASE in the last 168 hours. No results for input(s): AMMONIA in the last 168 hours.  CBC: Recent Labs  Lab 05/23/20 0422 05/24/20 1732  WBC 8.1 7.9  HGB 7.9* 8.0*  HCT 25.1* 24.3*  MCV 93.7 93.1  PLT 185 184    Cardiac Enzymes: No results for input(s): CKTOTAL, CKMB, CKMBINDEX, TROPONINI in the last 168 hours.  BNP: Invalid input(s): POCBNP  CBG: Recent Labs  Lab 05/24/20 1149 05/24/20 1602 05/24/20 2118 05/25/20 0749 05/25/20 1138  GLUCAP 129* 130* 135* 91 142*    Microbiology: Results for orders placed or performed during the hospital encounter of 05/17/20  SARS CORONAVIRUS 2 (TAT 6-24 HRS) Nasopharyngeal Nasopharyngeal Swab     Status: None   Collection Time: 05/17/20  4:32 PM   Specimen: Nasopharyngeal Swab  Result Value Ref Range Status   SARS Coronavirus 2 NEGATIVE NEGATIVE Final    Comment: (NOTE) SARS-CoV-2 target nucleic acids are NOT DETECTED.  The SARS-CoV-2 RNA is generally detectable in upper and lower respiratory specimens during the acute phase of infection. Negative  results do not preclude SARS-CoV-2 infection, do not rule out co-infections with other pathogens, and should not be used as the sole basis for treatment or other patient management decisions. Negative results must be combined with clinical observations, patient history, and epidemiological information. The expected result is Negative.  Fact Sheet for Patients: SugarRoll.be  Fact Sheet for Healthcare Providers: https://www.woods-mathews.com/  This test is not yet approved or cleared by the [place] FDA and  has been authorized for detection and/or diagnosis of SARS-CoV-2 by FDA under an Emergency Use Authorization (EUA). This EUA will remain   in effect (meaning this test can be used) for the duration of the COVID-19 declaration under Se ction 564(b)(1) of the Act, 21 U.S.C. section 360bbb-3(b)(1), unless the authorization is terminated or revoked sooner.  Performed at [facility], [place] [address], [place], [place] [address]   CULTURE, BLOOD (ROUTINE X 2) w Reflex to ID Panel     Status: None   Collection Time: 05/17/20  5:04 PM   Specimen: BLOOD  Result Value Ref Range Status   Specimen Description BLOOD BLOOD LEFT ARM  Final   Special Requests   Final    BOTTLES DRAWN AEROBIC AND ANAEROBIC Blood Culture results may not be optimal due to an excessive volume of blood received in culture bottles   Culture   Final    NO GROWTH 5 DAYS Performed at [facility], [address] [place], [place] [address]    Report Status 05/22/2020 FINAL  Final  CULTURE, BLOOD (ROUTINE X 2) w Reflex to ID Panel     Status: None   Collection Time: 05/17/20  5:04 PM   Specimen: BLOOD  Result Value Ref Range Status   Specimen Description BLOOD RIGHT ANTECUBITAL  Final   Special Requests   Final    BOTTLES DRAWN AEROBIC AND ANAEROBIC Blood Culture results may not be optimal due to an excessive volume of blood received in culture bottles   Culture   Final    NO GROWTH 5 DAYS Performed at [facility], [address] [place], [place] [address]    Report Status 05/22/2020 FINAL  Final    Coagulation Studies: No results for input(s): LABPROT, INR in the last 72 hours.  Urinalysis: No results for input(s): COLORURINE, LABSPEC, PHURINE, GLUCOSEU, HGBUR, BILIRUBINUR, KETONESUR, PROTEINUR, UROBILINOGEN, NITRITE, LEUKOCYTESUR in the last 72 hours.  Invalid input(s): APPERANCEUR    Imaging: No results found.   Medications:   . sodium chloride 10 mL/hr at 05/22/20 0425  . sodium chloride 75 mL/hr at 05/24/20 0319  . ampicillin-sulbactam (UNASYN) IV 3 g (05/25/20 0542)   . amLODipine  10 mg Oral Daily  .  aspirin  81 mg Oral Daily  . atorvastatin  40 mg Oral Daily  . carvedilol  25 mg Oral BID  . donepezil  10 mg Oral QHS  . heparin  5,000 Units Subcutaneous Q8H  . insulin aspart  0-5 Units Subcutaneous QHS  . insulin aspart  0-9 Units Subcutaneous TID WC  . levothyroxine  50 mcg Oral Q0600  . sodium chloride flush  3 mL Intravenous Q12H   sodium chloride, acetaminophen, bisacodyl, hydrALAZINE, HYDROmorphone (DILAUDID) injection, ondansetron (ZOFRAN) IV, polyethylene glycol, sodium chloride flush  Assessment/ Plan:  [name] is a 74 y.o. black female with hypothyroidism, hypertension, hyperlipidemia, peripheral vascular disease, diabetes mellitus type II insulin dependent who was admitted to [facility] on 05/17/2020 for Pain [R52] Diabetic foot infection ([place]) [K99.833, L08.9]  Type 2 diabetes mellitus with right diabetic foot ulcer ([place]) [V25.366, L97.519]  1. Acute kidney injury on chronic kidney disease stage IV with proteinuria and history of hyperkalemia: Baseline creatinine of 1.69, GFR of 32 on 04/02/2020.  Chronic kidney disease secondary to diabetic nephropathy and hypertension.  Acute kidney injury from IV contrast nephropathy. Angiogram with 70cc of IV dye used on 2/11.  - IV fluids: NS at 48mL/hr - will continue this for another day -Creatinine continues to improve -Crt level 2.4 -heat/ice to ease muscle spasms in back  2. Hypertension: Blood pressure at goal.  Prescribed carvedilol and amlodipine.   3. Diabetes mellitus type II: with chronic kidney disease: insulin dependent - Hgb  A1c-6.1  4. Diabetic foot wound: left second toe with ulceration. With peripheral vascular disease -Plan for angiogram next week - Appreciate podiatry and vascular input.    LOS: 8 Samiah Ricklefs 2/17/202212:24 PM

## 2020-05-26 DIAGNOSIS — L089 Local infection of the skin and subcutaneous tissue, unspecified: Secondary | ICD-10-CM | POA: Diagnosis not present

## 2020-05-26 DIAGNOSIS — E11628 Type 2 diabetes mellitus with other skin complications: Secondary | ICD-10-CM | POA: Diagnosis not present

## 2020-05-26 LAB — GLUCOSE, CAPILLARY
Glucose-Capillary: 85 mg/dL (ref 70–99)
Glucose-Capillary: 139 mg/dL — ABNORMAL HIGH (ref 70–99)
Glucose-Capillary: 121 mg/dL — ABNORMAL HIGH (ref 70–99)
Glucose-Capillary: 189 mg/dL — ABNORMAL HIGH (ref 70–99)

## 2020-05-26 LAB — RENAL FUNCTION PANEL
Albumin: 2.2 g/dL — ABNORMAL LOW (ref 3.5–5.0)
Anion gap: 9 (ref 5–15)
BUN: 23 mg/dL (ref 8–23)
CO2: 18 mmol/L — ABNORMAL LOW (ref 22–32)
Calcium: 8.1 mg/dL — ABNORMAL LOW (ref 8.9–10.3)
Chloride: 110 mmol/L (ref 98–111)
Creatinine, Ser: 1.99 mg/dL — ABNORMAL HIGH (ref 0.44–1.00)
GFR, Estimated: 26 mL/min — ABNORMAL LOW (ref >60)
Glucose, Bld: 85 mg/dL (ref 70–99)
Phosphorus: 3.1 mg/dL (ref 2.5–4.6)
Potassium: 3.7 mmol/L (ref 3.5–5.1)
Sodium: 137 mmol/L (ref 135–145)

## 2020-05-26 LAB — CBC WITH DIFFERENTIAL/PLATELET
Abs Immature Granulocytes: 0.03 10*3/uL (ref 0.00–0.07)
Basophils Absolute: 0 10*3/uL (ref 0.0–0.1)
Basophils Relative: 0 %
Eosinophils Absolute: 0.1 10*3/uL (ref 0.0–0.5)
Eosinophils Relative: 2 %
HCT: 24.8 % — ABNORMAL LOW (ref 36.0–46.0)
Hemoglobin: 7.7 g/dL — ABNORMAL LOW (ref 12.0–15.0)
Immature Granulocytes: 0 %
Lymphocytes Relative: 29 %
Lymphs Abs: 2.2 10*3/uL (ref 0.7–4.0)
MCH: 29.4 pg (ref 26.0–34.0)
MCHC: 31 g/dL (ref 30.0–36.0)
MCV: 94.7 fL (ref 80.0–100.0)
Monocytes Absolute: 0.9 10*3/uL (ref 0.1–1.0)
Monocytes Relative: 11 %
Neutro Abs: 4.3 10*3/uL (ref 1.7–7.7)
Neutrophils Relative %: 58 %
Platelets: 184 10*3/uL (ref 150–400)
RBC: 2.62 MIL/uL — ABNORMAL LOW (ref 3.87–5.11)
RDW: 14.2 % (ref 11.5–15.5)
WBC: 7.6 10*3/uL (ref 4.0–10.5)
nRBC: 0 % (ref 0.0–0.2)

## 2020-05-26 LAB — BASIC METABOLIC PANEL
Anion gap: 8 (ref 5–15)
BUN: 24 mg/dL — ABNORMAL HIGH (ref 8–23)
CO2: 17 mmol/L — ABNORMAL LOW (ref 22–32)
Calcium: 7.9 mg/dL — ABNORMAL LOW (ref 8.9–10.3)
Chloride: 109 mmol/L (ref 98–111)
Creatinine, Ser: 2.03 mg/dL — ABNORMAL HIGH (ref 0.44–1.00)
GFR, Estimated: 25 mL/min — ABNORMAL LOW (ref >60)
Glucose, Bld: 142 mg/dL — ABNORMAL HIGH (ref 70–99)
Potassium: 3.7 mmol/L (ref 3.5–5.1)
Sodium: 134 mmol/L — ABNORMAL LOW (ref 135–145)

## 2020-05-26 MED ORDER — ZINC OXIDE 40 % EX OINT
TOPICAL_OINTMENT | Freq: Two times a day (BID) | CUTANEOUS | Status: DC | PRN
  Filled 2020-05-26: qty 113

## 2020-05-26 MED ORDER — SODIUM BICARBONATE 8.4 % IV SOLN
INTRAVENOUS | Status: DC
  Filled 2020-05-26 (×4): qty 850
  Filled 2020-05-26: qty 150

## 2020-05-26 NOTE — Progress Notes (Signed)
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 Delana Meyer.  Creatinine 1.88 with GFR of 28 on admission. Creatinine 3.71 today and nephrology consulted.   Patient seen resting in bed Alert and oriented.  Denies shortness of breath and chest pain Able to eat most of breakfast. Denies nausea  IVF-NS @75   Objective:  Vital signs in last 24 hours:  Temp:  [98.1 F (36.7 C)-100.2 F (37.9 C)] 98.9 F (37.2 C) (02/18 0814) Pulse Rate:  [52-65] 65 (02/18 0814) Resp:  [15-17] 17 (02/18 0814) BP: (92-128)/(40-74) 125/57 (02/18 0814) SpO2:  [98 %-100 %] 99 % (02/18 0814)  Weight change:  Filed Weights   05/17/20 1201  Weight: 90 kg    Intake/Output: I/O last 3 completed shifts: In: -  Out: 1000 [Urine:1000]   Intake/Output this shift:  Total I/O In: 120 [P.O.:120] Out: -   Physical Exam: General: NAD,   Head: Normocephalic, atraumatic. Moist oral mucosal membranes  Eyes: Anicteric  Neck: Supple, trachea midline  Lungs:  Clear to auscultation  Heart: Regular rate and rhythm  Abdomen:  Soft, nontender, obese  Extremities:  no peripheral edema. Left foot in dressings  Neurologic: Nonfocal, moving all four extremities  Skin: No lesions        Basic Metabolic Panel: Recent Labs  Lab 05/23/20 0422 05/24/20 0508 05/25/20 0515 05/26/20 0752 05/26/20 0958  NA 135  --  138 137 134*  K 4.2  --  3.6 3.7 3.7  CL 103  --  110 110 109  CO2 20*  --  18* 18* 17*  GLUCOSE 106*  --  83 85 142*  BUN 48*  --  33* 23 24*  CREATININE 3.71* 3.17* 2.43* 1.99* 2.03*  CALCIUM 7.8*  --  7.9* 8.1* 7.9*  PHOS  --   --  3.7 3.1  --     Liver Function Tests: Recent Labs  Lab 05/24/20 0508 05/25/20 0515  05/26/20 0752  AST 15  --   --   ALT 8  --   --   ALKPHOS 51  --   --   BILITOT 0.7  --   --   PROT 6.7  --   --   ALBUMIN 2.3* 2.2* 2.2*   No results for input(s): LIPASE, AMYLASE in the last 168 hours. No results for input(s): AMMONIA in the last 168 hours.  CBC: Recent Labs  Lab 05/23/20 0422 05/24/20 1732 05/26/20 0757  WBC 8.1 7.9 7.6  NEUTROABS  --   --  4.3  HGB 7.9* 8.0* 7.7*  HCT 25.1* 24.3* 24.8*  MCV 93.7 93.1 94.7  PLT 185 184 184    Cardiac Enzymes: No results for input(s): CKTOTAL, CKMB, CKMBINDEX, TROPONINI in the last 168 hours.  BNP: Invalid input(s): POCBNP  CBG: Recent Labs  Lab 05/25/20 0749 05/25/20 1138 05/25/20 1623 05/25/20 2122 05/26/20 0815  GLUCAP 91 142* 98 131* 85    Microbiology: Results for orders placed or performed during the hospital encounter of 05/17/20  SARS CORONAVIRUS 2 (TAT 6-24 HRS) Nasopharyngeal Nasopharyngeal Swab     Status: None   Collection Time: 05/17/20  4:32 PM   Specimen: Nasopharyngeal Swab  Result Value Ref Range Status   SARS Coronavirus 2 NEGATIVE  NEGATIVE Final    Comment: (NOTE) SARS-CoV-2 target nucleic acids are NOT DETECTED.  The SARS-CoV-2 RNA is generally detectable in upper and lower respiratory specimens during the acute phase of infection. Negative results do not preclude SARS-CoV-2 infection, do not rule out co-infections with other pathogens, and should not be used as the sole basis for treatment or other patient management decisions. Negative results must be combined with clinical observations, patient history, and epidemiological information. The expected result is Negative.  Fact Sheet for Patients: SugarRoll.be  Fact Sheet for Healthcare Providers: https://www.woods-mathews.com/  This test is not yet approved or cleared by the [place] FDA and  has been authorized for detection and/or diagnosis of SARS-CoV-2 by FDA under an Emergency  Use Authorization (EUA). This EUA will remain  in effect (meaning this test can be used) for the duration of the COVID-19 declaration under Se ction 564(b)(1) of the Act, 21 U.S.C. section 360bbb-3(b)(1), unless the authorization is terminated or revoked sooner.  Performed at [facility], [place] [address], [place], [place] [address]   CULTURE, BLOOD (ROUTINE X 2) w Reflex to ID Panel     Status: None   Collection Time: 05/17/20  5:04 PM   Specimen: BLOOD  Result Value Ref Range Status   Specimen Description BLOOD BLOOD LEFT ARM  Final   Special Requests   Final    BOTTLES DRAWN AEROBIC AND ANAEROBIC Blood Culture results may not be optimal due to an excessive volume of blood received in culture bottles   Culture   Final    NO GROWTH 5 DAYS Performed at [facility], [address] [place], [place] [address]    Report Status 05/22/2020 FINAL  Final  CULTURE, BLOOD (ROUTINE X 2) w Reflex to ID Panel     Status: None   Collection Time: 05/17/20  5:04 PM   Specimen: BLOOD  Result Value Ref Range Status   Specimen Description BLOOD RIGHT ANTECUBITAL  Final   Special Requests   Final    BOTTLES DRAWN AEROBIC AND ANAEROBIC Blood Culture results may not be optimal due to an excessive volume of blood received in culture bottles   Culture   Final    NO GROWTH 5 DAYS Performed at [facility], [address], [place],  [address]    Report Status 05/22/2020 FINAL  Final    Coagulation Studies: No results for input(s): LABPROT, INR in the last 72 hours.  Urinalysis: No results for input(s): COLORURINE, LABSPEC, PHURINE, GLUCOSEU, HGBUR, BILIRUBINUR, KETONESUR, PROTEINUR, UROBILINOGEN, NITRITE, LEUKOCYTESUR in the last 72 hours.  Invalid input(s): APPERANCEUR    Imaging: No results found.   Medications:   . sodium chloride 10 mL/hr at 05/22/20 0425  . ampicillin-sulbactam (UNASYN) IV 3 g (05/26/20 0555)  . sodium bicarbonate (isotonic) 150 mEq in  D5W 1000 mL infusion     . amLODipine  10 mg Oral Daily  . aspirin  81 mg Oral Daily  . atorvastatin  40 mg Oral Daily  . carvedilol  25 mg Oral BID  . donepezil  10 mg Oral QHS  . heparin  5,000 Units Subcutaneous Q8H  . insulin aspart  0-5 Units Subcutaneous QHS  . insulin aspart  0-9 Units Subcutaneous TID WC  . levothyroxine  50 mcg Oral Q0600  . sodium chloride flush  3 mL Intravenous Q12H   sodium chloride, acetaminophen, bisacodyl, hydrALAZINE, HYDROmorphone (DILAUDID) injection, ondansetron (ZOFRAN) IV, polyethylene glycol, sodium chloride flush  Assessment/ Plan:  [name] [name] is a 74 y.o. 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 Acute kidney injury on chronic kidney disease stage IV with proteinuria and history of hyperkalemia: Baseline creatinine of 1.69, GFR of 32 on 04/02/2020.  Chronic kidney disease secondary to diabetic nephropathy and hypertension.  Acute kidney injury from IV contrast nephropathy. Angiogram with 70cc of IV dye used on 2/11.  -Will discontinue NS IVF -Start Sodium Bicarbonate @75  ml/hr -Current level -2.03  2. Hypertension: Blood pressure at goal.  Prescribed carvedilol and amlodipine.   3. Diabetes mellitus type II: with chronic kidney disease: insulin dependent - Hgb  A1c-6.1 -Glucose levels controlled  4. Diabetic foot wound: left second toe with ulceration. With peripheral vascular disease -Plan for angiogram next week - Appreciate podiatry and vascular input.    LOS: 9 Shantelle Breeze 2/18/202211:15 AM

## 2020-05-26 NOTE — Progress Notes (Signed)
PROGRESS NOTE    [name] [name]  ERD:[id number] DOB: [birth year] DOA: 05/17/2020 PCP: Marguerita Merles, MD    Brief Narrative:  [name] [name] a 75 y.o.femalewith medical history significant ofDM, HTN, HLD, CKD-IV, hypothyroidism, gout,whopresents with foot ulcer with infection in right second toe.  Pt states that she noted a ulcer in dorsalaspects ofrightsecondarytoe about1 week ago, which has been progressively worsening.Shenotedlittle pus drainage.  2/16-creatinine down to 3.17 2/17 creatinine improving, today 2.43 2/18-creatinine 2.03   Consultants:   vascular, podiatry, nephrology  Procedures:   Antimicrobials:   Unasyn   Subjective: Complaining feeling sore on her buttocks. Would like a cream. No other issues  Objective: Vitals:   05/25/20 1950 05/25/20 2317 05/26/20 0516 05/26/20 0814  BP: (!) 113/50 92/74 (!) 128/57 (!) 125/57  Pulse: (!) 55 (!) 57 (!) 53 65  Resp: 16 16 16 17   Temp: 98.2 F (36.8 C) 98.1 F (36.7 C) 98.3 F (36.8 C) 98.9 F (37.2 C)  TempSrc:      SpO2: 98% 99% 100% 99%  Weight:      Height:        Intake/Output Summary (Last 24 hours) at 05/26/2020 5631 Last data filed at 05/25/2020 2348 Gross per 24 hour  Intake -  Output 1000 ml  Net -1000 ml   Filed Weights   05/17/20 1201  Weight: 90 kg    Examination: Nad, comfortable cta no w/r/r rrr s1/s2 Soft benign +bs No edema aaxox3    Data Reviewed: I have personally reviewed following labs and imaging studies  CBC: Recent Labs  Lab 05/23/20 0422 05/24/20 1732  WBC 8.1 7.9  HGB 7.9* 8.0*  HCT 25.1* 24.3*  MCV 93.7 93.1  PLT 185 497   Basic Metabolic Panel: Recent Labs  Lab 05/23/20 0422 05/24/20 0508 05/25/20 0515  NA 135  --  138  K 4.2  --  3.6  CL 103  --  110  CO2 20*  --  18*  GLUCOSE 106*  --  83  BUN 48*  --  33*  CREATININE 3.71* 3.17* 2.43*  CALCIUM 7.8*  --  7.9*  PHOS  --   --  3.7   GFR: Estimated Creatinine Clearance: 23.8  mL/min (A) (by C-G formula based on SCr of 2.43 mg/dL (H)). Liver Function Tests: Recent Labs  Lab 05/24/20 0508 05/25/20 0515  AST 15  --   ALT 8  --   ALKPHOS 51  --   BILITOT 0.7  --   PROT 6.7  --   ALBUMIN 2.3* 2.2*   No results for input(s): LIPASE, AMYLASE in the last 168 hours. No results for input(s): AMMONIA in the last 168 hours. Coagulation Profile: No results for input(s): INR, PROTIME in the last 168 hours. Cardiac Enzymes: No results for input(s): CKTOTAL, CKMB, CKMBINDEX, TROPONINI in the last 168 hours. BNP (last 3 results) No results for input(s): PROBNP in the last 8760 hours. HbA1C: Recent Labs    05/24/20 0508  HGBA1C 6.1*   CBG: Recent Labs  Lab 05/25/20 0749 05/25/20 1138 05/25/20 1623 05/25/20 2122 05/26/20 0815  GLUCAP 91 142* 98 131* 85   Lipid Profile: No results for input(s): CHOL, HDL, LDLCALC, TRIG, CHOLHDL, LDLDIRECT in the last 72 hours. Thyroid Function Tests: No results for input(s): TSH, T4TOTAL, FREET4, T3FREE, THYROIDAB in the last 72 hours. Anemia Panel: Recent Labs    05/24/20 0508  FERRITIN 149  TIBC 118*  IRON 28   Sepsis Labs: No  results for input(s): PROCALCITON, LATICACIDVEN in the last 168 hours.  Recent Results (from the past 240 hour(s))  SARS CORONAVIRUS 2 (TAT 6-24 HRS) Nasopharyngeal Nasopharyngeal Swab     Status: None   Collection Time: 05/17/20  4:32 PM   Specimen: Nasopharyngeal Swab  Result Value Ref Range Status   SARS Coronavirus 2 NEGATIVE NEGATIVE Final    Comment: (NOTE) SARS-CoV-2 target nucleic acids are NOT DETECTED.  The SARS-CoV-2 RNA is generally detectable in upper and lower respiratory specimens during the acute phase of infection. Negative results do not preclude SARS-CoV-2 infection, do not rule out co-infections with other pathogens, and should not be used as the sole basis for treatment or other patient management decisions. Negative results must be combined with clinical  observations, patient history, and epidemiological information. The expected result is Negative.  Fact Sheet for Patients: SugarRoll.be  Fact Sheet for Healthcare Providers: https://www.woods-mathews.com/  This test is not yet approved or cleared by the [place] FDA and  has been authorized for detection and/or diagnosis of SARS-CoV-2 by FDA under an Emergency Use Authorization (EUA). This EUA will remain  in effect (meaning this test can be used) for the duration of the COVID-19 declaration under Se ction 564(b)(1) of the Act, 21 U.S.C. section 360bbb-3(b)(1), unless the authorization is terminated or revoked sooner.  Performed at [facility], [place] [address], [place], [place] [address]   CULTURE, BLOOD (ROUTINE X 2) w Reflex to ID Panel     Status: None   Collection Time: 05/17/20  5:04 PM   Specimen: BLOOD  Result Value Ref Range Status   Specimen Description BLOOD BLOOD LEFT ARM  Final   Special Requests   Final    BOTTLES DRAWN AEROBIC AND ANAEROBIC Blood Culture results may not be optimal due to an excessive volume of blood received in culture bottles   Culture   Final    NO GROWTH 5 DAYS Performed at [facility], [address] [place], [place] [address]    Report Status 05/22/2020 FINAL  Final  CULTURE, BLOOD (ROUTINE X 2) w Reflex to ID Panel     Status: None   Collection Time: 05/17/20  5:04 PM   Specimen: BLOOD  Result Value Ref Range Status   Specimen Description BLOOD RIGHT ANTECUBITAL  Final   Special Requests   Final    BOTTLES DRAWN AEROBIC AND ANAEROBIC Blood Culture results may not be optimal due to an excessive volume of blood received in culture bottles   Culture   Final    NO GROWTH 5 DAYS Performed at [facility], [address] [place], [place] [address]    Report Status 05/22/2020 FINAL  Final         Radiology Studies: No results found.      Scheduled Meds: .  amLODipine  10 mg Oral Daily  . aspirin  81 mg Oral Daily  . atorvastatin  40 mg Oral Daily  . carvedilol  25 mg Oral BID  . donepezil  10 mg Oral QHS  . heparin  5,000 Units Subcutaneous Q8H  . insulin aspart  0-5 Units Subcutaneous QHS  . insulin aspart  0-9 Units Subcutaneous TID WC  . levothyroxine  50 mcg Oral Q0600  . sodium chloride flush  3 mL Intravenous Q12H   Continuous Infusions: . sodium chloride 10 mL/hr at 05/22/20 0425  . sodium chloride 75 mL/hr at 05/24/20 0319  . ampicillin-sulbactam (UNASYN) IV 3 g (05/26/20 0555)  Assessment & Plan:   Principal Problem:   Diabetic foot infection_right second toe  Active Problems:   HTN (hypertension)   Hypothyroidism (acquired)   Hyperlipidemia   CKD (chronic kidney disease), stage IV (HCC)   Type II diabetes mellitus with renal manifestations ([place])   GERD (gastroesophageal reflux disease)   Lidia Clavijo Brownis a 75 y.o.femalewith medical history significant ofDM, HTN, HLD, CKD-IV, hypothyroidism, gout,whopresents with foot ulcer with infection in right second toe.  Pt states that she noted a ulcer in dorsalaspects ofrightsecondarytoe about1 week ago, which has been progressively worsening.Shenotedlittle pus drainage.  Diabetic footulcer withinfection right second UEA:[id number] does not have fever or leukocytosis. Clinically not septic. Hemodynamically stable. Dr. Cleda Mccreedy of podiatry, Dr. Delana Meyer of [facility] consulted. 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 Normal arterial waveforms at the level of the ankle. --Patient seen by vascular surgery Dr. Delana Meyer  --2/11-- revascularization unsuccessful. Repeat plan for next Tuesday -- podiatry consult with Dr. Vickki Muff appreciated plans for possible amputation Sometime next week after second attempt for  revascularization --2/15-- vascular procedure for second attempt to revascularization canceled today due to elevated creatinine 2/18 -preulcerative site to the right second toe appears to be stable.  Although bone does appear to be exposed at this level.  Right lateral heel ulceration appears to be callused over at this time and completely closed.   Continue with Prevalon boots  Continue with same dressing changes orders  We will continue to await vascular procedure prior to amputation of the right second toe, awaiting renal function stabilization first    HTN (hypertension) Stable  Continue amlodipine and Coreg  IV hydralazine as needed     Hypothyroidism (acquired) Continue Synthroid  Hyperlipidemia Continue Lipitor  Acute on CKD (chronic kidney disease), stage IV ([place]): suspected contrast induced nephropathy (IV dye used on 2/11) Baseline creatinine 1.6-1.9 recently. - Her creatinine is 1.96 --angiogram---1.98--3.17>>>2.42 2/18 -renal function improving, creatinine down to 2.03 today  2/17-improving with IVF. Nephrology following, will discontinue normal saline IV fluid and switch to sodium bicarb at 75 mL's per hour     Type II diabetes mellitus with renal manifestations [facility]): Recent A1c 7.0, not well controlled. -Sliding scale insulin -Hold Lantus due to low sugars  GERD (gastroesophageal reflux disease) -Protonix  At baseline patient has been bedbound for more than seven months. She is taken care of by son and other family members at home.    DVT prophylaxis: Heparin Code Status: Full Family Communication: None at bedside  Status is: Inpatient  Remains inpatient appropriate because:Inpatient level of care appropriate due to severity of illness   Dispo: The patient is from: Home              Anticipated d/c is to: TBD              Anticipated d/c date is: > 3 days              Patient currently is not medically stable to d/c.   Difficult to place  patient No  Patient remains in-house secondary to diabetic foot ulcer with possible osteomyelitis.  Status post angiogram with unsuccessful attempt to revascularization.  Creatinine was elevated now getting IV fluids for suspected contrast-induced nephropathy.  Plan for more work-up and possible amputation after creatinine stabilized.            LOS: 9 days   Time spent:  35 min with >50% on coc    Nolberto Hanlon, MD [facility] Pager [phone]  If 7PM-7AM, please contact night-coverage 05/26/2020, 8:32 AM

## 2020-05-26 NOTE — Progress Notes (Signed)
ORTHOPAEDIC progress note  REQUESTING PHYSICIAN: Nolberto Hanlon, MD  Chief Complaint: Right second toe wound  HPI: [name] [name] is a 75 y.o. female who presents resting in bed comfortably with son at bedside.  Patient is awaiting to undergo another vascular procedure to try to improve blood flow to the right lower extremity prior to amputation of the right second toe.  Patient denies any complaints today.  Past Medical History:  Diagnosis Date  . Diabetes mellitus without complication ([place])   . Humerus fracture 04/20/2018   left  . Hyperlipidemia   . Hypertension   . Hypothyroidism   . Vertigo    several yrs ago  . Wears dentures    partial upper and lower (loose)   Past Surgical History:  Procedure Laterality Date  . CATARACT EXTRACTION W/PHACO Left 12/01/2018   Procedure: CATARACT EXTRACTION PHACO AND INTRAOCULAR LENS PLACEMENT ([place]) LEFT DIABETIC;  Surgeon: Birder Robson, MD;  Location: [facility];  Service: Ophthalmology;  Laterality: Left;  Diabetic - insulin  . COLONOSCOPY WITH PROPOFOL N/A 07/07/2019   Procedure: COLONOSCOPY WITH PROPOFOL;  Surgeon: Robert Bellow, MD;  Location: [facility];  Service: Endoscopy;  Laterality: N/A;  . ESOPHAGOGASTRODUODENOSCOPY (EGD) WITH PROPOFOL N/A 07/07/2019   Procedure: ESOPHAGOGASTRODUODENOSCOPY (EGD) WITH PROPOFOL;  Surgeon: Robert Bellow, MD;  Location: [facility] END[facility]COPY;  Service: Endoscopy;  Laterality: N/A;  . FOOT SURGERY Right    lesion excision  . LOWER EXTREMITY ANGIOGRAPHY Right 05/19/2020   Procedure: Lower Extremity Angiography;  Surgeon: Katha Cabal, MD;  Location: [facility];  Service: Cardiovascular;  Laterality: Right;   Social History   Socioeconomic History  . Marital status: Divorced    Spouse name: Not on file  . Number of children: Not on file  . Years of education: Not on file  . Highest education level: Not on file  Occupational History  . Not on file  Tobacco Use  .  Smoking status: Never Smoker  . Smokeless tobacco: Never Used  Vaping Use  . Vaping Use: Never used  Substance and Sexual Activity  . Alcohol use: No  . Drug use: Never  . Sexual activity: Not on file  Other Topics Concern  . Not on file  Social History Narrative  . Not on file   Social Determinants of Health   Financial Resource Strain: Not on file  Food Insecurity: Not on file  Transportation Needs: Not on file  Physical Activity: Not on file  Stress: Not on file  Social Connections: Not on file   Family History  Problem Relation Age of Onset  . Breast cancer Neg Hx    No Known Allergies Prior to Admission medications   Medication Sig Start Date End Date Taking? Authorizing Provider  amLODipine (NORVASC) 10 MG tablet Take 1 tablet by mouth daily. 05/02/16  Yes [provider]  ASPIRIN 81 PO Take by mouth daily.   Yes [provider]  atorvastatin (LIPITOR) 40 MG tablet Take 1 tablet by mouth at bedtime.  05/02/16  Yes [provider]  carvedilol (COREG) 25 MG tablet Take 1 tablet by mouth 2 (two) times daily. 05/02/16  Yes [provider]  donepezil (ARICEPT) 10 MG tablet Take 10 mg by mouth at bedtime.  04/21/18  Yes [provider]  insulin aspart protamine- aspart (NOVOLOG MIX 70/30) (70-30) 100 UNIT/ML injection Inject 15 Units into the skin 2 (two) times daily with a meal.   Yes [provider]  polyethylene  glycol (MIRALAX / GLYCOLAX) 17 g packet Take 17 g by mouth daily. Mix one tablespoon with 8oz of your favorite juice or water every day until you are having soft formed stools. Then start taking once daily if you didn't have a stool the day before. 04/02/20  Yes Merlyn Lot, MD  SYNTHROID 50 MCG tablet Take 1 tablet by mouth daily. 04/21/18  Yes [provider]  TRESIBA FLEXTOUCH 100 UNIT/ML SOPN FlexTouch Pen Inject 80 Units into the skin at bedtime.  04/21/18  Yes [provider]  bisacodyl  (DULCOLAX) 10 MG suppository Place 1 suppository (10 mg total) rectally as needed for moderate constipation. 04/02/20   Merlyn Lot, MD   No results found.  Positive ROS: All other systems have been reviewed and were otherwise negative with the exception of those mentioned in the HPI and as above.  12 point ROS was performed.  Physical Exam: General: Alert and oriented.  No apparent distress.  Vascular:  Left foot:Dorsalis Pedis:  absent Posterior Tibial:  absent  Right foot: Dorsalis Pedis:  absent Posterior Tibial:  absent  Neuro:absent protective sensation  Derm: Obvious full-thickness ulcer to the right second toe with exposed joint and cartilage of the PIPJ.  Mild erythema to the right dorsal foot extending to the metatarsophalangeal joint region.    Ulceration present to the right lateral heel appears to be callused over at this time with no opening.  Ortho/MS: Diffuse edema to the right foot.  Obvious Charcot changes to the right foot and ankle.  Assessment: 1.  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 -Ulcerative site to the right second toe still appears to be stable at this time although bone does appear to be exposed at this level.  No increased erythema or edema noted, appears to be very stable overall with no worsening signs of infection. -Right lateral heel ulceration appears to be callused over at this time and completely closed. -Continue with Prevalon boots as they appear to be working well. -Continue with same dressing change orders. -We will continue to await vascular procedure prior to amputation of the right second toe.  Patient may not have enough blood flow to heal amputation and current state according to vascular.  Appreciate recommendations. -Continue with antibiotics per medicine orders.  Podiatry team will follow back up with patient after undergoing  the revascularization procedure to plan for formal amputation of right second toe.  Caroline More, DPM  05/26/2020 1:00 PM

## 2020-05-27 DIAGNOSIS — E11628 Type 2 diabetes mellitus with other skin complications: Secondary | ICD-10-CM | POA: Diagnosis not present

## 2020-05-27 DIAGNOSIS — L089 Local infection of the skin and subcutaneous tissue, unspecified: Secondary | ICD-10-CM | POA: Diagnosis not present

## 2020-05-27 LAB — BASIC METABOLIC PANEL
Anion gap: 8 (ref 5–15)
BUN: 20 mg/dL (ref 8–23)
CO2: 21 mmol/L — ABNORMAL LOW (ref 22–32)
Calcium: 8.2 mg/dL — ABNORMAL LOW (ref 8.9–10.3)
Chloride: 109 mmol/L (ref 98–111)
Creatinine, Ser: 1.79 mg/dL — ABNORMAL HIGH (ref 0.44–1.00)
GFR, Estimated: 30 mL/min — ABNORMAL LOW (ref >60)
Glucose, Bld: 120 mg/dL — ABNORMAL HIGH (ref 70–99)
Potassium: 3.9 mmol/L (ref 3.5–5.1)
Sodium: 138 mmol/L (ref 135–145)

## 2020-05-27 LAB — GLUCOSE, CAPILLARY
Glucose-Capillary: 96 mg/dL (ref 70–99)
Glucose-Capillary: 146 mg/dL — ABNORMAL HIGH (ref 70–99)
Glucose-Capillary: 110 mg/dL — ABNORMAL HIGH (ref 70–99)
Glucose-Capillary: 126 mg/dL — ABNORMAL HIGH (ref 70–99)

## 2020-05-27 MED ORDER — RISAQUAD PO CAPS
1.0000 | ORAL_CAPSULE | Freq: Every day | ORAL | Status: DC
  Administered 2020-05-27 – 2020-06-05 (×9): 1 via ORAL
  Filled 2020-05-27 (×10): qty 1

## 2020-05-27 MED ORDER — SODIUM CHLORIDE 0.9 % IV SOLN
3.0000 g | Freq: Three times a day (TID) | INTRAVENOUS | Status: DC
  Administered 2020-05-27 – 2020-06-01 (×13): 3 g via INTRAVENOUS
  Filled 2020-05-27 (×8): qty 8
  Filled 2020-05-27: qty 3
  Filled 2020-05-27 (×8): qty 8

## 2020-05-27 NOTE — Progress Notes (Signed)
PROGRESS NOTE    [name] [name]  ZOX:[id number] DOB: 1945-12-15 DOA: 05/17/2020 PCP: Marguerita Merles, MD    Brief Narrative:  ROBINA [name] a 75 y.o.femalewith medical history significant ofDM, HTN, HLD, CKD-IV, hypothyroidism, gout,whopresents with foot ulcer with infection in right second toe.  Pt states that she noted a ulcer in dorsalaspects ofrightsecondarytoe about1 week ago, which has been progressively worsening.Shenotedlittle pus drainage.  2/16-creatinine down to 3.17 2/17 creatinine improving, today 2.43 2/18-creatinine 2.03 2/19-buttocks less sore with cream use. Creatinine down to 1.79  Consultants:   vascular, podiatry, nephrology  Procedures:   Antimicrobials:   Unasyn   Subjective: No complaints. Was having some loose stool, wanted probiotic  Objective: Vitals:   05/26/20 1930 05/26/20 2314 05/27/20 0443 05/27/20 0744  BP: (!) 111/48 (!) 117/53 (!) 138/53 (!) 123/55  Pulse: (!) 53 (!) 51 (!) 55 61  Resp: 18 17 18 16   Temp: 98.8 F (37.1 C) 98.4 F (36.9 C) 98.4 F (36.9 C) 98.4 F (36.9 C)  TempSrc: Oral     SpO2: 100% 99% 99% 98%  Weight:      Height:        Intake/Output Summary (Last 24 hours) at 05/27/2020 0902 Last data filed at 05/27/2020 0756 Gross per 24 hour  Intake 1291.51 ml  Output 250 ml  Net 1041.51 ml   Filed Weights   05/17/20 1201  Weight: 90 kg    Examination: Nad, calm cta  No w/r/r Regular s1/s2 Soft benign, +bs No edema    Data Reviewed: I have personally reviewed following labs and imaging studies  CBC: Recent Labs  Lab 05/23/20 0422 05/24/20 1732 05/26/20 0757  WBC 8.1 7.9 7.6  NEUTROABS  --   --  4.3  HGB 7.9* 8.0* 7.7*  HCT 25.1* 24.3* 24.8*  MCV 93.7 93.1 94.7  PLT 185 184 811   Basic Metabolic Panel: Recent Labs  Lab 05/23/20 0422 05/24/20 0508 05/25/20 0515 05/26/20 0752 05/26/20 0958 05/27/20 0453  NA 135  --  138 137 134* 138  K 4.2  --  3.6 3.7 3.7 3.9  CL 103   --  110 110 109 109  CO2 20*  --  18* 18* 17* 21*  GLUCOSE 106*  --  83 85 142* 120*  BUN 48*  --  33* 23 24* 20  CREATININE 3.71* 3.17* 2.43* 1.99* 2.03* 1.79*  CALCIUM 7.8*  --  7.9* 8.1* 7.9* 8.2*  PHOS  --   --  3.7 3.1  --   --    GFR: Estimated Creatinine Clearance: 32.3 mL/min (A) (by C-G formula based on SCr of 1.79 mg/dL (H)). Liver Function Tests: Recent Labs  Lab 05/24/20 0508 05/25/20 0515 05/26/20 0752  AST 15  --   --   ALT 8  --   --   ALKPHOS 51  --   --   BILITOT 0.7  --   --   PROT 6.7  --   --   ALBUMIN 2.3* 2.2* 2.2*   No results for input(s): LIPASE, AMYLASE in the last 168 hours. No results for input(s): AMMONIA in the last 168 hours. Coagulation Profile: No results for input(s): INR, PROTIME in the last 168 hours. Cardiac Enzymes: No results for input(s): CKTOTAL, CKMB, CKMBINDEX, TROPONINI in the last 168 hours. BNP (last 3 results) No results for input(s): PROBNP in the last 8760 hours. HbA1C: No results for input(s): HGBA1C in the last 72 hours. CBG: Recent Labs  Lab  05/26/20 0815 05/26/20 1249 05/26/20 1702 05/26/20 2043 05/27/20 0746  GLUCAP 85 139* 121* 189* 96   Lipid Profile: No results for input(s): CHOL, HDL, LDLCALC, TRIG, CHOLHDL, LDLDIRECT in the last 72 hours. Thyroid Function Tests: No results for input(s): TSH, T4TOTAL, FREET4, T3FREE, THYROIDAB in the last 72 hours. Anemia Panel: No results for input(s): VITAMINB12, FOLATE, FERRITIN, TIBC, IRON, RETICCTPCT in the last 72 hours. Sepsis Labs: No results for input(s): PROCALCITON, LATICACIDVEN in the last 168 hours.  Recent Results (from the past 240 hour(s))  SARS CORONAVIRUS 2 (TAT 6-24 HRS) Nasopharyngeal Nasopharyngeal Swab     Status: None   Collection Time: 05/17/20  4:32 PM   Specimen: Nasopharyngeal Swab  Result Value Ref Range Status   SARS Coronavirus 2 NEGATIVE NEGATIVE Final    Comment: (NOTE) SARS-CoV-2 target nucleic acids are NOT DETECTED.  The SARS-CoV-2  RNA is generally detectable in upper and lower respiratory specimens during the acute phase of infection. Negative results do not preclude SARS-CoV-2 infection, do not rule out co-infections with other pathogens, and should not be used as the sole basis for treatment or other patient management decisions. Negative results must be combined with clinical observations, patient history, and epidemiological information. The expected result is Negative.  Fact Sheet for Patients: SugarRoll.be  Fact Sheet for Healthcare Providers: https://www.woods-mathews.com/  This test is not yet approved or cleared by the [place] FDA and  has been authorized for detection and/or diagnosis of SARS-CoV-2 by FDA under an Emergency Use Authorization (EUA). This EUA will remain  in effect (meaning this test can be used) for the duration of the COVID-19 declaration under Se ction 564(b)(1) of the Act, 21 U.S.C. section 360bbb-3(b)(1), unless the authorization is terminated or revoked sooner.  Performed at [facility], [place] [address], [place], [place] [address]   CULTURE, BLOOD (ROUTINE X 2) w Reflex to ID Panel     Status: None   Collection Time: 05/17/20  5:04 PM   Specimen: BLOOD  Result Value Ref Range Status   Specimen Description BLOOD BLOOD LEFT ARM  Final   Special Requests   Final    BOTTLES DRAWN AEROBIC AND ANAEROBIC Blood Culture results may not be optimal due to an excessive volume of blood received in culture bottles   Culture   Final    NO GROWTH 5 DAYS Performed at [facility], [address] [place], [place] [address]    Report Status 05/22/2020 FINAL  Final  CULTURE, BLOOD (ROUTINE X 2) w Reflex to ID Panel     Status: None   Collection Time: 05/17/20  5:04 PM   Specimen: BLOOD  Result Value Ref Range Status   Specimen Description BLOOD RIGHT ANTECUBITAL  Final   Special Requests   Final    BOTTLES DRAWN AEROBIC AND  ANAEROBIC Blood Culture results may not be optimal due to an excessive volume of blood received in culture bottles   Culture   Final    NO GROWTH 5 DAYS Performed at [facility], [address] [place], [place] [address]    Report Status 05/22/2020 FINAL  Final         Radiology Studies: No results found.      Scheduled Meds: . acidophilus  1 capsule Oral Daily  . amLODipine  10 mg Oral Daily  . aspirin  81 mg Oral Daily  . atorvastatin  40 mg Oral Daily  . carvedilol  25 mg Oral BID  .  donepezil  10 mg Oral QHS  . heparin  5,000 Units Subcutaneous Q8H  . insulin aspart  0-5 Units Subcutaneous QHS  . insulin aspart  0-9 Units Subcutaneous TID WC  . levothyroxine  50 mcg Oral Q0600  . sodium chloride flush  3 mL Intravenous Q12H   Continuous Infusions: . sodium chloride 10 mL/hr at 05/22/20 0425  . ampicillin-sulbactam (UNASYN) IV 3 g (05/26/20 1800)  . sodium bicarbonate (isotonic) 150 mEq in D5W 1000 mL infusion Stopped (05/26/20 1527)    Assessment & Plan:   Principal Problem:   Diabetic foot infection_right second toe  Active Problems:   HTN (hypertension)   Hypothyroidism (acquired)   Hyperlipidemia   CKD (chronic kidney disease), stage IV (HCC)   Type II diabetes mellitus with renal manifestations ([place])   GERD (gastroesophageal reflux disease)   Larry Knipp Brownis a 75 y.o.femalewith medical history significant ofDM, HTN, HLD, CKD-IV, hypothyroidism, gout,whopresents with foot ulcer with infection in right second toe.  Pt states that she noted a ulcer in dorsalaspects ofrightsecondarytoe about1 week ago, which has been progressively worsening.Shenotedlittle pus drainage.  Diabetic footulcer withinfection right second ERD:EYCXKGY does not have fever or leukocytosis. Clinically not septic. Hemodynamically stable. Dr. Cleda Mccreedy of podiatry, Dr. Delana Meyer of VVSare consulted. 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 Normal arterial waveforms at the level of the ankle. --Patient seen by vascular surgery Dr. Delana Meyer  --2/11-- revascularization unsuccessful. Repeat plan for next Tuesday -- podiatry consult with Dr. Vickki Muff appreciated plans for possible amputation Sometime next week after second attempt for revascularization --2/15-- vascular procedure for second attempt to revascularization canceled today due to elevated creatinine 2/18 -preulcerative site to the right second toe appears to be stable.  Although bone does appear to be exposed at this level.  Right lateral heel ulceration appears to be callused over at this time and completely closed.   Continue with Prevalon boots  Continue with same dressing changes orders  2/19-waiting vascular procedure when creatinine stabilizes, then podiatry plan for amputation of rt second toe.   HTN (hypertension) Stable Continue amlodipine and Coreg IV hydralazine as needed     Hypothyroidism (acquired) Continue Synthroid  Hyperlipidemia Continue Lipitor  Acute on CKD (chronic kidney disease), stage IV ([place]): suspected contrast induced nephropathy (IV dye used on 2/11) Baseline creatinine 1.6-1.9 recently. - Her creatinine is 1.96 --angiogram---1.98--3.17>>>2.42>>>1.79 2/18 -renal function improving, creatinine down to 2.03 today  2/17-improving with IVF. 2/19 creatinine improving down to 1.79 Nephrology following  We will continue sodium bicarb at 50 MLS per hour     Type II diabetes mellitus with renal manifestations [facility]): Recent A1c 7.0, not well controlled. -Sliding scale insulin -Hold Lantus due to low sugars  GERD (gastroesophageal reflux disease) -Protonix  At baseline patient has been bedbound for more than seven months. She is taken care of by son and other family members at  home.    DVT prophylaxis: Heparin Code Status: Full Family Communication: None at bedside  Status is: Inpatient  Remains inpatient appropriate because:Inpatient level of care appropriate due to severity of illness   Dispo: The patient is from: Home              Anticipated d/c is to: TBD              Anticipated d/c date is: > 3 days  Patient currently is not medically stable to d/c.   Difficult to place patient No  Patient remains in-house secondary to diabetic foot ulcer with possible osteomyelitis.  Status post angiogram with unsuccessful attempt to revascularization.  Creatinine was elevated now getting IV fluids for suspected contrast-induced nephropathy.  Plan for more work-up and possible amputation after creatinine stabilized.            LOS: 10 days   Time spent: 35 min with >50% on coc    Nolberto Hanlon, MD [facility] Pager [phone]  If 7PM-7AM, please contact night-coverage 05/27/2020, 9:02 AM

## 2020-05-27 NOTE — Progress Notes (Signed)
PHARMACY NOTE:  ANTIMICROBIAL RENAL DOSAGE ADJUSTMENT  Current antimicrobial regimen includes a mismatch between antimicrobial dosage and estimated renal function.  As per policy approved by the Pharmacy & Therapeutics and Medical Executive Committees, the antimicrobial dosage will be adjusted accordingly.  Current antimicrobial dosage:  Unasyn 3 gm IV q12h  Indication: wound infection  Renal Function: Estimated Creatinine Clearance: 32.3 mL/min (A) (by C-G formula based on SCr of 1.79 mg/dL (H)).     Antimicrobial dosage has been changed to:  Unasyn 3 gm IV q8h  (For Crcl 32.3 ml/min)  Additional comments:   Thank you for allowing pharmacy to be a part of this patient's care.  Lu Duffel, PharmD, BCPS Clinical Pharmacist 05/27/2020 11:49 AM

## 2020-05-27 NOTE — Progress Notes (Signed)
Patient reports she felt short of breath earlier in the night. She has refused her morning dose of IV antibiotics because of loose stools, and she wants to delay her dressing change. Patient also requests no breakfast this morning and wants only Glucerna.  VS are WNL for patient.

## 2020-05-27 NOTE — Progress Notes (Signed)
Pt refused care nurse notified

## 2020-05-27 NOTE — Progress Notes (Signed)
[facility]  ROUNDING NOTE   Subjective:   [name] admitted to [facility] on 05/17/2020 for Pain [R52] Diabetic foot infection ([place]) [E11.628, L08.9] Type 2 diabetes mellitus with right diabetic foot ulcer ([place]) [Z61.096, L97.519]  Patient underwent angiogram of left lower extremity on 2/11 by Dr. Delana [name].  Creatinine 1.88 with GFR of 28 on admission. Creatinine 3.71 today and nephrology consulted.   Patient seen resting in bed Alert and oriented Denies short of breath and chest pain Able to eat meals, denies nausea No concerns at this time  IVF-Sodium Bicarb @50   Objective:  Vital signs in last 24 hours:  Temp:  [98.2 F (36.8 C)-98.8 F (37.1 C)] 98.2 F (36.8 C) (02/19 1221) Pulse Rate:  [50-105] 105 (02/19 1221) Resp:  [16-18] 16 (02/19 1221) BP: (108-138)/(48-55) 108/55 (02/19 1221) SpO2:  [98 %-100 %] 100 % (02/19 1221)  Weight change:  Filed Weights   05/17/20 1201  Weight: 90 kg    Intake/Output: I/O last 3 completed shifts: In: 1291.5 [P.O.:620; I.V.:171.5; IV Piggyback:500] Out: 300 [Urine:300]   Intake/Output this shift:  Total I/O In: -  Out: 250 [Urine:250]  Physical Exam: General: NAD,   Head: Normocephalic, atraumatic. Moist oral mucosal membranes  Eyes: Anicteric  Neck: Supple, trachea midline  Lungs:  Clear to auscultation  Heart: Regular rate and rhythm  Abdomen:  Soft, nontender, obese  Extremities:  no peripheral edema. right foot in dressings  Neurologic: Nonfocal, moving all four extremities  Skin: Abrasion on right, second toe        Basic Metabolic Panel: Recent Labs  Lab 05/23/20 0422 05/24/20 0508 05/25/20 0515 05/26/20 0752 05/26/20 0958 05/27/20 0453  NA 135  --  138 137 134* 138  K 4.2  --  3.6 3.7 3.7 3.9  CL 103  --  110 110 109 109  CO2 20*  --  18* 18* 17* 21*  GLUCOSE 106*  --  83 85 142* 120*  BUN 48*  --  33* 23 24* 20  CREATININE 3.71* 3.17* 2.43* 1.99* 2.03* 1.79*  CALCIUM 7.8*  --   7.9* 8.1* 7.9* 8.2*  PHOS  --   --  3.7 3.1  --   --     Liver Function Tests: Recent Labs  Lab 05/24/20 0508 05/25/20 0515 05/26/20 0752  AST 15  --   --   ALT 8  --   --   ALKPHOS 51  --   --   BILITOT 0.7  --   --   PROT 6.7  --   --   ALBUMIN 2.3* 2.2* 2.2*   No results for input(s): LIPASE, AMYLASE in the last 168 hours. No results for input(s): AMMONIA in the last 168 hours.  CBC: Recent Labs  Lab 05/23/20 0422 05/24/20 1732 05/26/20 0757  WBC 8.1 7.9 7.6  NEUTROABS  --   --  4.3  HGB 7.9* 8.0* 7.7*  HCT 25.1* 24.3* 24.8*  MCV 93.7 93.1 94.7  PLT 185 184 184    Cardiac Enzymes: No results for input(s): CKTOTAL, CKMB, CKMBINDEX, TROPONINI in the last 168 hours.  BNP: Invalid input(s): POCBNP  CBG: Recent Labs  Lab 05/26/20 1249 05/26/20 1702 05/26/20 2043 05/27/20 0746 05/27/20 1208  GLUCAP 139* 121* 189* 96 146*    Microbiology: Results for orders placed or performed during the hospital encounter of 05/17/20  SARS CORONAVIRUS 2 (TAT 6-24 HRS) Nasopharyngeal Nasopharyngeal Swab     Status: None   Collection  Time: 05/17/20  4:32 PM   Specimen: Nasopharyngeal Swab  Result Value Ref Range Status   SARS Coronavirus 2 NEGATIVE NEGATIVE Final    Comment: (NOTE) SARS-CoV-2 target nucleic acids are NOT DETECTED.  The SARS-CoV-2 RNA is generally detectable in upper and lower respiratory specimens during the acute phase of infection. Negative results do not preclude SARS-CoV-2 infection, do not rule out co-infections with other pathogens, and should not be used as the sole basis for treatment or other patient management decisions. Negative results must be combined with clinical observations, patient history, and epidemiological information. The expected result is Negative.  Fact Sheet for Patients: SugarRoll.be  Fact Sheet for Healthcare Providers: https://www.woods-mathews.com/  This test is not yet  approved or cleared by the [place] FDA and  has been authorized for detection and/or diagnosis of SARS-CoV-2 by FDA under an Emergency Use Authorization (EUA). This EUA will remain  in effect (meaning this test can be used) for the duration of the COVID-19 declaration under Se ction 564(b)(1) of the Act, 21 U.S.C. section 360bbb-3(b)(1), unless the authorization is terminated or revoked sooner.  Performed at [facility], [place] [address], [place], [place] [address]   CULTURE, BLOOD (ROUTINE X 2) w Reflex to ID Panel     Status: None   Collection Time: 05/17/20  5:04 PM   Specimen: BLOOD  Result Value Ref Range Status   Specimen Description BLOOD BLOOD LEFT ARM  Final   Special Requests   Final    BOTTLES DRAWN AEROBIC AND ANAEROBIC Blood Culture results may not be optimal due to an excessive volume of blood received in culture bottles   Culture   Final    NO GROWTH 5 DAYS Performed at [facility], [address] [place], [place] [address]    Report Status 05/22/2020 FINAL  Final  CULTURE, BLOOD (ROUTINE X 2) w Reflex to ID Panel     Status: None   Collection Time: 05/17/20  5:04 PM   Specimen: BLOOD  Result Value Ref Range Status   Specimen Description BLOOD RIGHT ANTECUBITAL  Final   Special Requests   Final    BOTTLES DRAWN AEROBIC AND ANAEROBIC Blood Culture results may not be optimal due to an excessive volume of blood received in culture bottles   Culture   Final    NO GROWTH 5 DAYS Performed at [facility], [address], [place], [place] [address]    Report Status 05/22/2020 FINAL  Final    Coagulation Studies: No results for input(s): LABPROT, INR in the last 72 hours.  Urinalysis: No results for input(s): COLORURINE, LABSPEC, PHURINE, GLUCOSEU, HGBUR, BILIRUBINUR, KETONESUR, PROTEINUR, UROBILINOGEN, NITRITE, LEUKOCYTESUR in the last 72 hours.  Invalid input(s): APPERANCEUR    Imaging: No results found.   Medications:   .  sodium chloride 10 mL/hr at 05/22/20 0425  . ampicillin-sulbactam (UNASYN) IV    . sodium bicarbonate (isotonic) 150 mEq in D5W 1000 mL infusion Stopped (05/26/20 1527)   . acidophilus  1 capsule Oral Daily  . amLODipine  10 mg Oral Daily  . aspirin  81 mg Oral Daily  . atorvastatin  40 mg Oral Daily  . carvedilol  25 mg Oral BID  . donepezil  10 mg Oral QHS  . heparin  5,000 Units Subcutaneous Q8H  . insulin aspart  0-5 Units Subcutaneous QHS  . insulin aspart  0-9 Units Subcutaneous TID WC  . levothyroxine  50 mcg Oral Q0600  . sodium  chloride flush  3 mL Intravenous Q12H   sodium chloride, acetaminophen, bisacodyl, hydrALAZINE, HYDROmorphone (DILAUDID) injection, liver oil-zinc oxide, ondansetron (ZOFRAN) IV, polyethylene glycol, sodium chloride flush  Assessment/ Plan:  [name] is a 75 y.o. black female with hypothyroidism, hypertension, hyperlipidemia, peripheral vascular disease, diabetes mellitus type II insulin dependent who was admitted to [facility] on 05/17/2020 for Pain [R52] Diabetic foot infection ([place]) [R83.818, L08.9] Type 2 diabetes mellitus with right diabetic foot ulcer ([place]) [M03.754, L97.519]  1. Acute kidney injury on chronic kidney disease stage IV with proteinuria and history of hyperkalemia: Baseline creatinine of 1.69, GFR of 32 on 04/02/2020.  Chronic kidney disease secondary to diabetic nephropathy and hypertension.  Acute kidney injury from IV contrast nephropathy. Angiogram with 70cc of IV dye used on 2/11.  -continue Sodium Bicarbonate @50  ml/hr -improved Creatinine  -Current level -1.79  2. Hypertension: -Blood pressure at goal.  -Currently prescribed carvedilol and amlodipine.   3. Diabetes mellitus type II: with chronic kidney disease: insulin dependent - Hgb  A1c-6.1 -Glucose levels controlled  4. Diabetic foot wound: right second toe with ulceration. With peripheral vascular disease -Plan for angiogram next week - Appreciate podiatry and  vascular input.    LOS: [place] 2/19/202212:57 PM

## 2020-05-27 NOTE — Progress Notes (Signed)
Dressing change per orders

## 2020-05-28 DIAGNOSIS — L089 Local infection of the skin and subcutaneous tissue, unspecified: Secondary | ICD-10-CM | POA: Diagnosis not present

## 2020-05-28 DIAGNOSIS — E11628 Type 2 diabetes mellitus with other skin complications: Secondary | ICD-10-CM | POA: Diagnosis not present

## 2020-05-28 LAB — GLUCOSE, CAPILLARY
Glucose-Capillary: 95 mg/dL (ref 70–99)
Glucose-Capillary: 148 mg/dL — ABNORMAL HIGH (ref 70–99)
Glucose-Capillary: 98 mg/dL (ref 70–99)
Glucose-Capillary: 171 mg/dL — ABNORMAL HIGH (ref 70–99)

## 2020-05-28 LAB — CREATININE, SERUM
Creatinine, Ser: 1.76 mg/dL — ABNORMAL HIGH (ref 0.44–1.00)
GFR, Estimated: 30 mL/min — ABNORMAL LOW (ref >60)

## 2020-05-28 NOTE — Progress Notes (Signed)
PROGRESS NOTE    [name]  XNA:[id number] DOB: December 12, 1945 DOA: 05/17/2020 PCP: Marguerita Merles, MD    Brief Narrative:  [name] [name] a 75 y.o.femalewith medical history significant ofDM, HTN, HLD, CKD-IV, hypothyroidism, gout,whopresents with foot ulcer with infection in right second toe.  Pt states that she noted a ulcer in dorsalaspects ofrightsecondarytoe about1 week ago, which has been progressively worsening.Shenotedlittle pus drainage.  2/16-creatinine down to 3.17 2/17 creatinine improving, today 2.43 2/18-creatinine 2.03 2/19-buttocks less sore with cream use. Creatinine down to 1.79 2/20-stool less watery with addition of probiotics.  At 1.76 today  Consultants:   vascular, podiatry, nephrology  Procedures:   Antimicrobials:   Unasyn   Subjective: No complaints this am.   Objective: Vitals:   05/27/20 2000 05/28/20 0018 05/28/20 0522 05/28/20 0755  BP: (!) 110/53 (!) 118/51 133/62 103/80  Pulse: 62 (!) 49 (!) 47 67  Resp: 17 17 17 16   Temp: 98.4 F (36.9 C) 98.1 F (36.7 C) 98.2 F (36.8 C) 98.4 F (36.9 C)  TempSrc:    Oral  SpO2: 100% 96% 99% 100%  Weight:      Height:        Intake/Output Summary (Last 24 hours) at 05/28/2020 0840 Last data filed at 05/28/2020 0606 Gross per 24 hour  Intake 1378.68 ml  Output 500 ml  Net 878.68 ml   Filed Weights   05/17/20 1201  Weight: 90 kg    Examination: Calm, comfortable CTA, no wheeze rales rhonchi's Regular S1-S2 no murmurs Soft benign positive bowel sounds No edema Alert oriented x3 grossly intact Mood and affect appropriate in current setting   Data Reviewed: I have personally reviewed following labs and imaging studies  CBC: Recent Labs  Lab 05/23/20 0422 05/24/20 1732 05/26/20 0757  WBC 8.1 7.9 7.6  NEUTROABS  --   --  4.3  HGB 7.9* 8.0* 7.7*  HCT 25.1* 24.3* 24.8*  MCV 93.7 93.1 94.7  PLT 185 184 542   Basic Metabolic Panel: Recent Labs  Lab  05/23/20 0422 05/24/20 0508 05/25/20 0515 05/26/20 0752 05/26/20 0958 05/27/20 0453 05/28/20 0445  NA 135  --  138 137 134* 138  --   K 4.2  --  3.6 3.7 3.7 3.9  --   CL 103  --  110 110 109 109  --   CO2 20*  --  18* 18* 17* 21*  --   GLUCOSE 106*  --  83 85 142* 120*  --   BUN 48*  --  33* 23 24* 20  --   CREATININE 3.71*   < > 2.43* 1.99* 2.03* 1.79* 1.76*  CALCIUM 7.8*  --  7.9* 8.1* 7.9* 8.2*  --   PHOS  --   --  3.7 3.1  --   --   --    < > = values in this interval not displayed.   GFR: Estimated Creatinine Clearance: 32.8 mL/min (A) (by C-G formula based on SCr of 1.76 mg/dL (H)). Liver Function Tests: Recent Labs  Lab 05/24/20 0508 05/25/20 0515 05/26/20 0752  AST 15  --   --   ALT 8  --   --   ALKPHOS 51  --   --   BILITOT 0.7  --   --   PROT 6.7  --   --   ALBUMIN 2.3* 2.2* 2.2*   No results for input(s): LIPASE, AMYLASE in the last 168 hours. No results for input(s): AMMONIA in the  last 168 hours. Coagulation Profile: No results for input(s): INR, PROTIME in the last 168 hours. Cardiac Enzymes: No results for input(s): CKTOTAL, CKMB, CKMBINDEX, TROPONINI in the last 168 hours. BNP (last 3 results) No results for input(s): PROBNP in the last 8760 hours. HbA1C: No results for input(s): HGBA1C in the last 72 hours. CBG: Recent Labs  Lab 05/27/20 0746 05/27/20 1208 05/27/20 1651 05/27/20 2100 05/28/20 0757  GLUCAP 96 146* 110* 126* 95   Lipid Profile: No results for input(s): CHOL, HDL, LDLCALC, TRIG, CHOLHDL, LDLDIRECT in the last 72 hours. Thyroid Function Tests: No results for input(s): TSH, T4TOTAL, FREET4, T3FREE, THYROIDAB in the last 72 hours. Anemia Panel: No results for input(s): VITAMINB12, FOLATE, FERRITIN, TIBC, IRON, RETICCTPCT in the last 72 hours. Sepsis Labs: No results for input(s): PROCALCITON, LATICACIDVEN in the last 168 hours.  No results found for this or any previous visit (from the past 240 hour(s)).       Radiology  Studies: No results found.      Scheduled Meds: . acidophilus  1 capsule Oral Daily  . amLODipine  10 mg Oral Daily  . aspirin  81 mg Oral Daily  . atorvastatin  40 mg Oral Daily  . carvedilol  25 mg Oral BID  . donepezil  10 mg Oral QHS  . heparin  5,000 Units Subcutaneous Q8H  . insulin aspart  0-5 Units Subcutaneous QHS  . insulin aspart  0-9 Units Subcutaneous TID WC  . levothyroxine  50 mcg Oral Q0600  . sodium chloride flush  3 mL Intravenous Q12H   Continuous Infusions: . sodium chloride 10 mL/hr at 05/22/20 0425  . ampicillin-sulbactam (UNASYN) IV 3 g (05/28/20 0655)  . sodium bicarbonate (isotonic) 150 mEq in D5W 1000 mL infusion 50 mL/hr at 05/28/20 0347    Assessment & Plan:   Principal Problem:   Diabetic foot infection_right second toe  Active Problems:   HTN (hypertension)   Hypothyroidism (acquired)   Hyperlipidemia   CKD (chronic kidney disease), stage IV (HCC)   Type II diabetes mellitus with renal manifestations ([place])   GERD (gastroesophageal reflux disease)   [name] [name] a 75 y.o.femalewith medical history significant ofDM, HTN, HLD, CKD-IV, hypothyroidism, gout,whopresents with foot ulcer with infection in right second toe.  Pt states that she noted a ulcer in dorsalaspects ofrightsecondarytoe about1 week ago, which has been progressively worsening.Shenotedlittle pus drainage.  Diabetic footulcer withinfection right second HAL:[id number] does not have fever or leukocytosis. Clinically not septic. Hemodynamically stable. Dr. Cleda Mccreedy of podiatry, Dr. Delana Meyer of [facility] consulted. 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 Normal arterial waveforms at the level of the ankle. --Patient seen by vascular surgery Dr. Delana Meyer  --2/11-- revascularization unsuccessful. Repeat plan for next  Tuesday -- podiatry consult with Dr. Vickki Muff appreciated plans for possible amputation Sometime next week after second attempt for revascularization --2/15-- vascular procedure for second attempt to revascularization canceled today due to elevated creatinine 2/18 -preulcerative site to the right second toe appears to be stable.  Although bone does appear to be exposed at this level.  Right lateral heel ulceration appears to be callused over at this time and completely closed.   Continue with Prevalon boots  Continue with same dressing changes orders  2/20-awaiting vascular procedure when creatinine stabilizes, then podiatry plan for amputation of right second toe    HTN (hypertension) Stable  Continue amlodipine  and Coreg  IV hydralazine as needed      Hypothyroidism (acquired) Continue Synthroid  Hyperlipidemia Continue Lipitor  Acute on CKD (chronic kidney disease), stage IV ([place]): suspected contrast induced nephropathy (IV dye used on 2/11) Baseline creatinine 1.6-1.9 recently. - Her creatinine is 1.96 --angiogram---1.98--3.17>>>2.42>>>1.79 2/18 -renal function improving, creatinine down to 2.03 today  2/17-improving with IVF. 2/19 creatinine improving down to 1.79 2/20 creatinine improving, creatinine 1.76 today  Nephrology following  We will continue sodium bicarb at 50 MLS per hour      Type II diabetes mellitus with renal manifestations [facility]): Recent A1c 7.0, not well controlled. -Sliding scale insulin -Hold Lantus due to low sugars  GERD (gastroesophageal reflux disease) -Protonix  At baseline patient has been bedbound for more than seven months. She is taken care of by son and other family members at home.    DVT prophylaxis: Heparin Code Status: Full Family Communication: None at bedside  Status is: Inpatient  Remains inpatient appropriate because:Inpatient level of care appropriate due to severity of illness   Dispo: The patient is from:  Home              Anticipated d/c is to: TBD              Anticipated d/c date is: > 3 days              Patient currently is not medically stable to d/c.   Difficult to place patient No  Patient remains in-house secondary to diabetic foot ulcer with possible osteomyelitis.  Status post angiogram with unsuccessful attempt to revascularization.  Creatinine was elevated now getting IV fluids for suspected contrast-induced nephropathy.  Plan for more work-up and possible amputation after creatinine stabilized.            LOS: 11 days   Time spent: 35 min with >50% on coc    Nolberto Hanlon, MD [facility] Pager [phone]  If 7PM-7AM, please contact night-coverage 05/28/2020, 8:40 AM

## 2020-05-28 NOTE — Progress Notes (Signed)
[facility]  ROUNDING NOTE   Subjective:   [name] admitted to [facility] on 05/17/2020 for Pain [R52] Diabetic foot infection ([place]) [E11.628, L08.9] Type 2 diabetes mellitus with right diabetic foot ulcer ([place]) [H63.149, L97.519]  Patient underwent angiogram of left lower extremity on 2/11 by Dr. Delana Meyer.  Creatinine 1.88 with GFR of 28 on admission. Creatinine 3.71 today and nephrology consulted.   Patient seen resting in bed eating breakfast Alert and oriented Denies short of breath and chest pain Able to eat meals, drinks supplement Denies nausea  IVF-Sodium Bicarb @50    Objective:  Vital signs in last 24 hours:  Temp:  [98.1 F (36.7 C)-98.5 F (36.9 C)] 98.4 F (36.9 C) (02/20 0755) Pulse Rate:  [47-105] 67 (02/20 0755) Resp:  [16-17] 16 (02/20 0755) BP: (103-133)/(51-80) 103/80 (02/20 0755) SpO2:  [96 %-100 %] 100 % (02/20 0755)  Weight change:  Filed Weights   05/17/20 1201  Weight: 90 kg    Intake/Output: I/O last 3 completed shifts: In: 1378.7 [I.V.:1178.7; IV Piggyback:200] Out: 750 [Urine:750]   Intake/Output this shift:  Total I/O In: 237 [P.O.:237] Out: -   Physical Exam: General: NAD,   Head: Normocephalic, atraumatic. Moist oral mucosal membranes  Eyes: Anicteric  Neck: Supple, trachea midline  Lungs:  Clear to auscultation  Heart: Regular rate and rhythm  Abdomen:  Soft, nontender, obese  Extremities:  no peripheral edema. right foot in dressings  Neurologic: Nonfocal, moving all four extremities  Skin: Abrasion on right, second toe        Basic Metabolic Panel: Recent Labs  Lab 05/23/20 0422 05/24/20 0508 05/25/20 0515 05/26/20 0752 05/26/20 0958 05/27/20 0453 05/28/20 0445  NA 135  --  138 137 134* 138  --   K 4.2  --  3.6 3.7 3.7 3.9  --   CL 103  --  110 110 109 109  --   CO2 20*  --  18* 18* 17* 21*  --   GLUCOSE 106*  --  83 85 142* 120*  --   BUN 48*  --  33* 23 24* 20  --   CREATININE 3.71*   < >  2.43* 1.99* 2.03* 1.79* 1.76*  CALCIUM 7.8*  --  7.9* 8.1* 7.9* 8.2*  --   PHOS  --   --  3.7 3.1  --   --   --    < > = values in this interval not displayed.    Liver Function Tests: Recent Labs  Lab 05/24/20 0508 05/25/20 0515 05/26/20 0752  AST 15  --   --   ALT 8  --   --   ALKPHOS 51  --   --   BILITOT 0.7  --   --   PROT 6.7  --   --   ALBUMIN 2.3* 2.2* 2.2*   No results for input(s): LIPASE, AMYLASE in the last 168 hours. No results for input(s): AMMONIA in the last 168 hours.  CBC: Recent Labs  Lab 05/23/20 0422 05/24/20 1732 05/26/20 0757  WBC 8.1 7.9 7.6  NEUTROABS  --   --  4.3  HGB 7.9* 8.0* 7.7*  HCT 25.1* 24.3* 24.8*  MCV 93.7 93.1 94.7  PLT 185 184 184    Cardiac Enzymes: No results for input(s): CKTOTAL, CKMB, CKMBINDEX, TROPONINI in the last 168 hours.  BNP: Invalid input(s): POCBNP  CBG: Recent Labs  Lab 05/27/20 0746 05/27/20 1208 05/27/20 1651 05/27/20 2100 05/28/20 0757  GLUCAP  96 146* 110* 126* 95    Microbiology: Results for orders placed or performed during the hospital encounter of 05/17/20  SARS CORONAVIRUS 2 (TAT 6-24 HRS) Nasopharyngeal Nasopharyngeal Swab     Status: None   Collection Time: 05/17/20  4:32 PM   Specimen: Nasopharyngeal Swab  Result Value Ref Range Status   SARS Coronavirus 2 NEGATIVE NEGATIVE Final    Comment: (NOTE) SARS-CoV-2 target nucleic acids are NOT DETECTED.  The SARS-CoV-2 RNA is generally detectable in upper and lower respiratory specimens during the acute phase of infection. Negative results do not preclude SARS-CoV-2 infection, do not rule out co-infections with other pathogens, and should not be used as the sole basis for treatment or other patient management decisions. Negative results must be combined with clinical observations, patient history, and epidemiological information. The expected result is Negative.  Fact Sheet for Patients: SugarRoll.be  Fact  Sheet for Healthcare Providers: https://www.woods-mathews.com/  This test is not yet approved or cleared by the [place] FDA and  has been authorized for detection and/or diagnosis of SARS-CoV-2 by FDA under an Emergency Use Authorization (EUA). This EUA will remain  in effect (meaning this test can be used) for the duration of the COVID-19 declaration under Se ction 564(b)(1) of the Act, 21 U.S.C. section 360bbb-3(b)(1), unless the authorization is terminated or revoked sooner.  Performed at [facility], [place] [address], [place], [place] [address]   CULTURE, BLOOD (ROUTINE X 2) w Reflex to ID Panel     Status: None   Collection Time: 05/17/20  5:04 PM   Specimen: BLOOD  Result Value Ref Range Status   Specimen Description BLOOD BLOOD LEFT ARM  Final   Special Requests   Final    BOTTLES DRAWN AEROBIC AND ANAEROBIC Blood Culture results may not be optimal due to an excessive volume of blood received in culture bottles   Culture   Final    NO GROWTH 5 DAYS Performed at [facility], [address] [place], [place] [address]    Report Status 05/22/2020 FINAL  Final  CULTURE, BLOOD (ROUTINE X 2) w Reflex to ID Panel     Status: None   Collection Time: 05/17/20  5:04 PM   Specimen: BLOOD  Result Value Ref Range Status   Specimen Description BLOOD RIGHT ANTECUBITAL  Final   Special Requests   Final    BOTTLES DRAWN AEROBIC AND ANAEROBIC Blood Culture results may not be optimal due to an excessive volume of blood received in culture bottles   Culture   Final    NO GROWTH 5 DAYS Performed at [facility], [address], [place], [place] [address]    Report Status 05/22/2020 FINAL  Final    Coagulation Studies: No results for input(s): LABPROT, INR in the last 72 hours.  Urinalysis: No results for input(s): COLORURINE, LABSPEC, PHURINE, GLUCOSEU, HGBUR, BILIRUBINUR, KETONESUR, PROTEINUR, UROBILINOGEN, NITRITE, LEUKOCYTESUR in the last 72  hours.  Invalid input(s): APPERANCEUR    Imaging: No results found.   Medications:   . sodium chloride 10 mL/hr at 05/22/20 0425  . ampicillin-sulbactam (UNASYN) IV 3 g (05/28/20 0655)  . sodium bicarbonate (isotonic) 150 mEq in D5W 1000 mL infusion 50 mL/hr at 05/28/20 0347   . acidophilus  1 capsule Oral Daily  . amLODipine  10 mg Oral Daily  . aspirin  81 mg Oral Daily  . atorvastatin  40 mg Oral Daily  . carvedilol  25 mg Oral BID  .  donepezil  10 mg Oral QHS  . heparin  5,000 Units Subcutaneous Q8H  . insulin aspart  0-5 Units Subcutaneous QHS  . insulin aspart  0-9 Units Subcutaneous TID WC  . levothyroxine  50 mcg Oral Q0600  . sodium chloride flush  3 mL Intravenous Q12H   sodium chloride, acetaminophen, bisacodyl, hydrALAZINE, HYDROmorphone (DILAUDID) injection, liver oil-zinc oxide, ondansetron (ZOFRAN) IV, polyethylene glycol, sodium chloride flush  Assessment/ Plan:  [name] is a 75 y.o. black female with hypothyroidism, hypertension, hyperlipidemia, peripheral vascular disease, diabetes mellitus type II insulin dependent who was admitted to [facility] on 05/17/2020 for Pain [R52] Diabetic foot infection ([place]) [Q24.114, L08.9] Type 2 diabetes mellitus with right diabetic foot ulcer ([place]) [Y43.142, L97.519]  1. Acute kidney injury on chronic kidney disease stage IV with proteinuria and history of hyperkalemia: Baseline creatinine of 1.69, GFR of 32 on 04/02/2020.  Chronic kidney disease secondary to diabetic nephropathy and hypertension.  Acute kidney injury from IV contrast nephropathy. Angiogram with 70cc of IV dye used on 2/11.  -d/c IVF -Creatinine at baseline  2. Hypertension: -Blood pressure at goal.  -Currently prescribed carvedilol and amlodipine.   3. Diabetes mellitus type II: with chronic kidney disease: insulin dependent - Hgb  A1c-6.1 -Glucose levels controlled  4. Diabetic foot wound: right second toe with ulceration. With peripheral  vascular disease -Plan for angiogram this week - Appreciate podiatry and vascular input.    LOS: [place] 2/20/202210:25 AM

## 2020-05-29 DIAGNOSIS — E11621 Type 2 diabetes mellitus with foot ulcer: Secondary | ICD-10-CM | POA: Diagnosis not present

## 2020-05-29 DIAGNOSIS — L97519 Non-pressure chronic ulcer of other part of right foot with unspecified severity: Secondary | ICD-10-CM | POA: Diagnosis not present

## 2020-05-29 DIAGNOSIS — E11628 Type 2 diabetes mellitus with other skin complications: Secondary | ICD-10-CM | POA: Diagnosis not present

## 2020-05-29 DIAGNOSIS — T508X5A Adverse effect of diagnostic agents, initial encounter: Secondary | ICD-10-CM | POA: Diagnosis not present

## 2020-05-29 LAB — GLUCOSE, CAPILLARY
Glucose-Capillary: 88 mg/dL (ref 70–99)
Glucose-Capillary: 107 mg/dL — ABNORMAL HIGH (ref 70–99)
Glucose-Capillary: 87 mg/dL (ref 70–99)
Glucose-Capillary: 126 mg/dL — ABNORMAL HIGH (ref 70–99)

## 2020-05-29 LAB — CBC
HCT: 22.4 % — ABNORMAL LOW (ref 36.0–46.0)
Hemoglobin: 7.4 g/dL — ABNORMAL LOW (ref 12.0–15.0)
MCH: 30.3 pg (ref 26.0–34.0)
MCHC: 33 g/dL (ref 30.0–36.0)
MCV: 91.8 fL (ref 80.0–100.0)
Platelets: 190 10*3/uL (ref 150–400)
RBC: 2.44 MIL/uL — ABNORMAL LOW (ref 3.87–5.11)
RDW: 14.8 % (ref 11.5–15.5)
WBC: 7.9 10*3/uL (ref 4.0–10.5)
nRBC: 0 % (ref 0.0–0.2)

## 2020-05-29 NOTE — Progress Notes (Signed)
PROGRESS NOTE    [name] [name]  YBO:[id number] DOB: Jun 28, 1945 DOA: 05/17/2020 PCP: Marguerita Merles, MD    Brief Narrative:  [name] [name] a 75 y.o.femalewith medical history significant ofDM, HTN, HLD, CKD-IV, hypothyroidism, gout,whopresents with foot ulcer with infection in right second toe.  Pt states that she noted a ulcer in dorsalaspects ofrightsecondarytoe about1 week ago, which has been progressively worsening.Shenotedlittle pus drainage.  2/16-creatinine down to 3.17 2/17 creatinine improving, today 2.43 2/18-creatinine 2.03 2/19-buttocks less sore with cream use. Creatinine down to 1.79 2/20-stool less watery with addition of probiotics.  At 1.76 today 2/21-renal function stabilizing. No labs today  Consultants:   vascular, podiatry, nephrology  Procedures:   Antimicrobials:   Unasyn   Subjective: No abdominal pain, no foot pain, chest pain, shortness of breath  Objective: Vitals:   05/28/20 2027 05/29/20 0023 05/29/20 0457 05/29/20 0732  BP: (!) 120/46 (!) 110/46 133/86 128/61  Pulse: 64 65 64 61  Resp: 17 17 17 17   Temp: 98.9 F (37.2 C) 98.9 F (37.2 C) 98.5 F (36.9 C) 98.7 F (37.1 C)  TempSrc:    Oral  SpO2: 98% 96% 100% 96%  Weight:      Height:        Intake/Output Summary (Last 24 hours) at 05/29/2020 0830 Last data filed at 05/29/2020 2778 Gross per 24 hour  Intake 497 ml  Output 400 ml  Net 97 ml   Filed Weights   05/17/20 1201  Weight: 90 kg    Examination: Calm, comfortable CTA, no wheeze rales rhonchi's Regular S1-S2 no gallops Soft benign positive No edema Alert oriented x3, grossly intact Mood and affect appropriate in current setting  Data Reviewed: I have personally reviewed following labs and imaging studies  CBC: Recent Labs  Lab 05/23/20 0422 05/24/20 1732 05/26/20 0757 05/29/20 0345  WBC 8.1 7.9 7.6 7.9  NEUTROABS  --   --  4.3  --   HGB 7.9* 8.0* 7.7* 7.4*  HCT 25.1* 24.3* 24.8* 22.4*   MCV 93.7 93.1 94.7 91.8  PLT 185 184 184 242   Basic Metabolic Panel: Recent Labs  Lab 05/23/20 0422 05/24/20 0508 05/25/20 0515 05/26/20 0752 05/26/20 0958 05/27/20 0453 05/28/20 0445  NA 135  --  138 137 134* 138  --   K 4.2  --  3.6 3.7 3.7 3.9  --   CL 103  --  110 110 109 109  --   CO2 20*  --  18* 18* 17* 21*  --   GLUCOSE 106*  --  83 85 142* 120*  --   BUN 48*  --  33* 23 24* 20  --   CREATININE 3.71*   < > 2.43* 1.99* 2.03* 1.79* 1.76*  CALCIUM 7.8*  --  7.9* 8.1* 7.9* 8.2*  --   PHOS  --   --  3.7 3.1  --   --   --    < > = values in this interval not displayed.   GFR: Estimated Creatinine Clearance: 32.8 mL/min (A) (by C-G formula based on SCr of 1.76 mg/dL (H)). Liver Function Tests: Recent Labs  Lab 05/24/20 0508 05/25/20 0515 05/26/20 0752  AST 15  --   --   ALT 8  --   --   ALKPHOS 51  --   --   BILITOT 0.7  --   --   PROT 6.7  --   --   ALBUMIN 2.3* 2.2* 2.2*   No  results for input(s): LIPASE, AMYLASE in the last 168 hours. No results for input(s): AMMONIA in the last 168 hours. Coagulation Profile: No results for input(s): INR, PROTIME in the last 168 hours. Cardiac Enzymes: No results for input(s): CKTOTAL, CKMB, CKMBINDEX, TROPONINI in the last 168 hours. BNP (last 3 results) No results for input(s): PROBNP in the last 8760 hours. HbA1C: No results for input(s): HGBA1C in the last 72 hours. CBG: Recent Labs  Lab 05/28/20 0757 05/28/20 1148 05/28/20 1633 05/28/20 2032 05/29/20 0735  GLUCAP 95 148* 98 171* 88   Lipid Profile: No results for input(s): CHOL, HDL, LDLCALC, TRIG, CHOLHDL, LDLDIRECT in the last 72 hours. Thyroid Function Tests: No results for input(s): TSH, T4TOTAL, FREET4, T3FREE, THYROIDAB in the last 72 hours. Anemia Panel: No results for input(s): VITAMINB12, FOLATE, FERRITIN, TIBC, IRON, RETICCTPCT in the last 72 hours. Sepsis Labs: No results for input(s): PROCALCITON, LATICACIDVEN in the last 168 hours.  No  results found for this or any previous visit (from the past 240 hour(s)).       Radiology Studies: No results found.      Scheduled Meds: . acidophilus  1 capsule Oral Daily  . amLODipine  10 mg Oral Daily  . aspirin  81 mg Oral Daily  . atorvastatin  40 mg Oral Daily  . carvedilol  25 mg Oral BID  . donepezil  10 mg Oral QHS  . heparin  5,000 Units Subcutaneous Q8H  . insulin aspart  0-5 Units Subcutaneous QHS  . insulin aspart  0-9 Units Subcutaneous TID WC  . levothyroxine  50 mcg Oral Q0600  . sodium chloride flush  3 mL Intravenous Q12H   Continuous Infusions: . sodium chloride 10 mL/hr at 05/22/20 0425  . ampicillin-sulbactam (UNASYN) IV 3 g (05/29/20 0618)    Assessment & Plan:   Principal Problem:   Diabetic foot infection_right second toe  Active Problems:   HTN (hypertension)   Hypothyroidism (acquired)   Hyperlipidemia   CKD (chronic kidney disease), stage IV (HCC)   Type II diabetes mellitus with renal manifestations ([place])   GERD (gastroesophageal reflux disease)   [name] [name] a 75 y.o.femalewith medical history significant ofDM, HTN, HLD, CKD-IV, hypothyroidism, gout,whopresents with foot ulcer with infection in right second toe.  Pt states that she noted a ulcer in dorsalaspects ofrightsecondarytoe about1 week ago, which has been progressively worsening.Shenotedlittle pus drainage.  Diabetic footulcer withinfection right second PYP:[id number] does not have fever or leukocytosis. Clinically not septic. Hemodynamically stable. Dr. Cleda Mccreedy of podiatry, Dr. Delana Meyer of [facility] consulted. 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 Normal arterial waveforms at the level of the ankle. --Patient seen by vascular surgery Dr. Delana Meyer  --2/11-- revascularization unsuccessful. Repeat plan for  next Tuesday -- podiatry consult with Dr. Vickki Muff appreciated plans for possible amputation Sometime next week after second attempt for revascularization --2/15-- vascular procedure for second attempt to revascularization canceled today due to elevated creatinine 2/18 -preulcerative site to the right second toe appears to be stable.  Although bone does appear to be exposed at this level.  Right lateral heel ulceration appears to be callused over at this time and completely closed.   Continue with Prevalon boots  Continue with same dressing changes orders  2/20-awaiting vascular procedure when creatinine stabilizes, then podiatry plan for amputation of right second toe 2/21-Per vascular surgery plan for repeat angiogram to the  right lower extremity via the pedal approach tomorrow with Dr. Delana Meyer.   HTN (hypertension) Stable  Continue amlodipine and Coreg  IV hydralazine as needed      Hypothyroidism (acquired) Continue Synthroid  Hyperlipidemia Continue Lipitor  Acute on CKD (chronic kidney disease), stage IV ([place]): suspected contrast induced nephropathy (IV dye used on 2/11) Baseline creatinine 1.6-1.9 recently. - Her creatinine is 1.96 --angiogram---1.98--3.17>>>2.42>>>1.79 2/18 -renal function improving, creatinine down to 2.03 today  2/17-improving with IVF. 2/19 creatinine improving down to 1.79 2/20 creatinine improving, creatinine 1.76 today  2/21 no labs today.  IV fluids were discontinued yesterday by nephrology  Continue monitoring levels    Type II diabetes mellitus with renal manifestations [facility]): Recent A1c 7.0,  Blood glucose levels controlled R-ISS Hold Lantus due to low sugars   GERD (gastroesophageal reflux disease) Continue Protonix  At baseline patient has been bedbound for more than seven months. She is taken care of by son and other family members at home.    DVT prophylaxis: Heparin Code Status: Full Family Communication: None at  bedside  Status is: Inpatient  Remains inpatient appropriate because:Inpatient level of care appropriate due to severity of illness   Dispo: The patient is from: Home              Anticipated d/c is to: TBD              Anticipated d/c date is: > 3 days              Patient currently is not medically stable to d/c.   Difficult to place patient No  Patient remains in-house secondary to diabetic foot ulcer with possible osteomyelitis.  Status post angiogram with unsuccessful attempt to revascularization.  Creatinine was elevated now getting IV fluids for suspected contrast-induced nephropathy.  Plan for more work-up and possible amputation after creatinine stabilized.            LOS: 12 days   Time spent: 35 min with >50% on coc    Nolberto Hanlon, MD [facility] Pager [phone]  If 7PM-7AM, please contact night-coverage 05/29/2020, 8:30 AM

## 2020-05-29 NOTE — Progress Notes (Signed)
[facility] Daily Progress Note  Subjective: 05/19/20:             1.  Abdominal aortogram             2.  Right lower extremity angiography third order catheter placement             3.  Star close left common femoral artery  Patient without complaint this AM.  Patient still in agreement with plan for angiogram tomorrow.  Objective: Vitals:   05/29/20 0023 05/29/20 0457 05/29/20 0732 05/29/20 0800  BP: (!) 110/46 133/86 128/61 129/66  Pulse: 65 64 61 61  Resp: 17 17 17 16   Temp: 98.9 F (37.2 C) 98.5 F (36.9 C) 98.7 F (37.1 C) 98.4 F (36.9 C)  TempSrc:   Oral Oral  SpO2: 96% 100% 96% 96%  Weight:      Height:        Intake/Output Summary (Last 24 hours) at 05/29/2020 1132 Last data filed at 05/29/2020 8676 Gross per 24 hour  Intake 380 ml  Output 400 ml  Net -20 ml   Physical Exam: A&Ox3, NAD CV: RRR Pulmonary: CTA Bilaterally Abdomen: Soft, Nontender, Nondistended Vascular:  Right lower extremity: Thigh soft.  Calf soft.  Dressing intact clean and dry.   Laboratory: CBC    Component Value Date/Time   WBC 7.9 05/29/2020 0345   HGB 7.4 (L) 05/29/2020 0345   HGB 10.7 (L) 04/17/2014 1131   HCT 22.4 (L) 05/29/2020 0345   HCT 32.9 (L) 04/17/2014 1131   PLT 190 05/29/2020 0345   PLT 226 04/17/2014 1131   BMET    Component Value Date/Time   NA 138 05/27/2020 0453   NA 142 04/17/2014 1131   K 3.9 05/27/2020 0453   K 4.0 04/17/2014 1131   CL 109 05/27/2020 0453   CL 109 (H) 04/17/2014 1131   CO2 21 (L) 05/27/2020 0453   CO2 24 04/17/2014 1131   GLUCOSE 120 (H) 05/27/2020 0453   GLUCOSE 89 04/17/2014 1131   BUN 20 05/27/2020 0453   BUN 22 (H) 04/17/2014 1131   CREATININE 1.76 (H) 05/28/2020 0445   CREATININE 1.78 (H) 04/17/2014 1131   CALCIUM 8.2 (L) 05/27/2020 0453   CALCIUM 8.8 04/17/2014 1131   GFRNONAA 30 (L) 05/28/2020 0445   GFRNONAA 30 (L) 04/17/2014 1131   GFRNONAA 37 (L) 04/22/2013 0946   GFRAA 29 (L) 12/07/2019 0645    GFRAA 37 (L) 04/17/2014 1131   GFRAA 42 (L) 04/22/2013 0946   Assessment/Planning: The patient is a 75 year old female with multiple medical issues including known history 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 Delana Meyer.  AM BMP ordered. 2) procedure, risks and benefits were explained to the patient.  All questions were answered.  Patient wished to proceed. 3) on aspirin and statin for medical management  Discussed with Dr. Eber Hong [facility] PA-C 05/29/2020 11:32 AM

## 2020-05-29 NOTE — H&P (View-Only) (Signed)
[facility] Daily Progress Note  Subjective: 05/19/20:             1.  Abdominal aortogram             2.  Right lower extremity angiography third order catheter placement             3.  Star close left common femoral artery  Patient without complaint this AM.  Patient still in agreement with plan for angiogram tomorrow.  Objective: Vitals:   05/29/20 0023 05/29/20 0457 05/29/20 0732 05/29/20 0800  BP: (!) 110/46 133/86 128/61 129/66  Pulse: 65 64 61 61  Resp: 17 17 17 16   Temp: 98.9 F (37.2 C) 98.5 F (36.9 C) 98.7 F (37.1 C) 98.4 F (36.9 C)  TempSrc:   Oral Oral  SpO2: 96% 100% 96% 96%  Weight:      Height:        Intake/Output Summary (Last 24 hours) at 05/29/2020 1132 Last data filed at 05/29/2020 8016 Gross per 24 hour  Intake 380 ml  Output 400 ml  Net -20 ml   Physical Exam: A&Ox3, NAD CV: RRR Pulmonary: CTA Bilaterally Abdomen: Soft, Nontender, Nondistended Vascular:  Right lower extremity: Thigh soft.  Calf soft.  Dressing intact clean and dry.   Laboratory: CBC    Component Value Date/Time   WBC 7.9 05/29/2020 0345   HGB 7.4 (L) 05/29/2020 0345   HGB 10.7 (L) 04/17/2014 1131   HCT 22.4 (L) 05/29/2020 0345   HCT 32.9 (L) 04/17/2014 1131   PLT 190 05/29/2020 0345   PLT 226 04/17/2014 1131   BMET    Component Value Date/Time   NA 138 05/27/2020 0453   NA 142 04/17/2014 1131   K 3.9 05/27/2020 0453   K 4.0 04/17/2014 1131   CL 109 05/27/2020 0453   CL 109 (H) 04/17/2014 1131   CO2 21 (L) 05/27/2020 0453   CO2 24 04/17/2014 1131   GLUCOSE 120 (H) 05/27/2020 0453   GLUCOSE 89 04/17/2014 1131   BUN 20 05/27/2020 0453   BUN 22 (H) 04/17/2014 1131   CREATININE 1.76 (H) 05/28/2020 0445   CREATININE 1.78 (H) 04/17/2014 1131   CALCIUM 8.2 (L) 05/27/2020 0453   CALCIUM 8.8 04/17/2014 1131   GFRNONAA 30 (L) 05/28/2020 0445   GFRNONAA 30 (L) 04/17/2014 1131   GFRNONAA 37 (L) 04/22/2013 0946   GFRAA 29 (L) 12/07/2019 0645    GFRAA 37 (L) 04/17/2014 1131   GFRAA 42 (L) 04/22/2013 0946   Assessment/Planning: The patient is a 75 year old female with multiple medical issues including known history 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 Delana Meyer.  AM BMP ordered. 2) procedure, risks and benefits were explained to the patient.  All questions were answered.  Patient wished to proceed. 3) on aspirin and statin for medical management  Discussed with Dr. Eber Hong [facility] PA-C 05/29/2020 11:32 AM

## 2020-05-29 NOTE — Progress Notes (Signed)
ORTHOPAEDIC progress note  REQUESTING PHYSICIAN: Nolberto Hanlon, MD  Chief Complaint: Right second toe wound  HPI: [name] [name] is a 74 y.o. female who presents resting in bed comfortably with son at bedside.  Patient is awaiting to undergo another vascular procedure to try to improve blood flow to the right lower extremity prior to amputation of the right second toe.  Patient denies any complaints today.  Past Medical History:  Diagnosis Date  . Diabetes mellitus without complication ([place])   . Humerus fracture 04/20/2018   left  . Hyperlipidemia   . Hypertension   . Hypothyroidism   . Vertigo    several yrs ago  . Wears dentures    partial upper and lower (loose)   Past Surgical History:  Procedure Laterality Date  . CATARACT EXTRACTION W/PHACO Left 12/01/2018   Procedure: CATARACT EXTRACTION PHACO AND INTRAOCULAR LENS PLACEMENT ([place]) LEFT DIABETIC;  Surgeon: Birder Robson, MD;  Location: [facility];  Service: Ophthalmology;  Laterality: Left;  Diabetic - insulin  . COLONOSCOPY WITH PROPOFOL N/A 07/07/2019   Procedure: COLONOSCOPY WITH PROPOFOL;  Surgeon: Robert Bellow, MD;  Location: [facility] ENDOSCOPY;  Service: Endoscopy;  Laterality: N/A;  . ESOPHAGOGASTRODUODENOSCOPY (EGD) WITH PROPOFOL N/A 07/07/2019   Procedure: ESOPHAGOGASTRODUODENOSCOPY (EGD) WITH PROPOFOL;  Surgeon: Robert Bellow, MD;  Location: [facility] ENDOSCOPY;  Service: Endoscopy;  Laterality: N/A;  . FOOT SURGERY Right    lesion excision  . LOWER EXTREMITY ANGIOGRAPHY Right 05/19/2020   Procedure: Lower Extremity Angiography;  Surgeon: Katha Cabal, MD;  Location: [facility];  Service: Cardiovascular;  Laterality: Right;   Social History   Socioeconomic History  . Marital status: Divorced    Spouse name: Not on file  . Number of children: Not on file  . Years of education: Not on file  . Highest education level: Not on file  Occupational History  . Not on file  Tobacco Use  .  Smoking status: Never Smoker  . Smokeless tobacco: Never Used  Vaping Use  . Vaping Use: Never used  Substance and Sexual Activity  . Alcohol use: No  . Drug use: Never  . Sexual activity: Not on file  Other Topics Concern  . Not on file  Social History Narrative  . Not on file   Social Determinants of Health   Financial Resource Strain: Not on file  Food Insecurity: Not on file  Transportation Needs: Not on file  Physical Activity: Not on file  Stress: Not on file  Social Connections: Not on file   Family History  Problem Relation Age of Onset  . Breast cancer Neg Hx    No Known Allergies Prior to Admission medications   Medication Sig Start Date End Date Taking? Authorizing Provider  amLODipine (NORVASC) 10 MG tablet Take 1 tablet by mouth daily. 05/02/16  Yes [provider]  ASPIRIN 81 PO Take by mouth daily.   Yes [provider]  atorvastatin (LIPITOR) 40 MG tablet Take 1 tablet by mouth at bedtime.  05/02/16  Yes [provider]  carvedilol (COREG) 25 MG tablet Take 1 tablet by mouth 2 (two) times daily. 05/02/16  Yes [provider]  donepezil (ARICEPT) 10 MG tablet Take 10 mg by mouth at bedtime.  04/21/18  Yes [provider]  insulin aspart protamine- aspart (NOVOLOG MIX 70/30) (70-30) 100 UNIT/ML injection Inject 15 Units into the skin 2 (two) times daily with a meal.   Yes [provider]  polyethylene  glycol (MIRALAX / GLYCOLAX) 17 g packet Take 17 g by mouth daily. Mix one tablespoon with 8oz of your favorite juice or water every day until you are having soft formed stools. Then start taking once daily if you didn't have a stool the day before. 04/02/20  Yes Merlyn Lot, MD  SYNTHROID 50 MCG tablet Take 1 tablet by mouth daily. 04/21/18  Yes [provider]  TRESIBA FLEXTOUCH 100 UNIT/ML SOPN FlexTouch Pen Inject 80 Units into the skin at bedtime.  04/21/18  Yes [provider]  bisacodyl  (DULCOLAX) 10 MG suppository Place 1 suppository (10 mg total) rectally as needed for moderate constipation. 04/02/20   Merlyn Lot, MD   No results found.  Positive ROS: All other systems have been reviewed and were otherwise negative with the exception of those mentioned in the HPI and as above.  12 point ROS was performed.  Physical Exam: General: Alert and oriented.  No apparent distress.  Vascular:  Left foot:Dorsalis Pedis:  absent Posterior Tibial:  absent  Right foot: Dorsalis Pedis:  absent Posterior Tibial:  absent  Neuro:absent protective sensation  Derm: Obvious full-thickness ulcer to the right second toe with exposed joint and cartilage of the PIPJ.  Mild erythema to the right dorsal foot extending to the metatarsophalangeal joint region.    Ulceration present to the right lateral heel appears to be callused over at this time with no opening.  Ortho/MS: Diffuse edema to the right foot.  Obvious Charcot changes to the right foot and ankle.  Assessment: 1.  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 -Ulcerative site to the right second toe still appears to be stable at this time although bone does appear to be exposed at this level.  No increased erythema or edema noted, appears to be very stable overall with no worsening signs of infection. -Right lateral heel ulceration appears to be callused over at this time and completely closed. -Continue with Prevalon boots as they appear to be working well. -Continue with same dressing change orders. -We will continue to await vascular procedure prior to amputation of the right second toe.  Patient may not have enough blood flow to heal amputation and current state according to vascular.  Appreciate recommendations. -Continue with antibiotics per medicine orders. -Discussed all treatment options with patient both conservative  and surgical attempts at correction including potential risks and complications at this time patient is elected for right second toe amputation.  Discussed postoperative course in detail.  No guarantees given.  Patient agreeable to procedure. -We will plan on performing procedure on 05/31/2020.  Patient will be placed n.p.o. at least 8 hours prior to surgery.  Caroline More, DPM  05/29/2020 1:08 PM

## 2020-05-30 ENCOUNTER — Encounter: Payer: Self-pay | Admitting: Vascular Surgery

## 2020-05-30 ENCOUNTER — Encounter
Admission: EM | Disposition: A | Discharge status: Home Health | Payer: Self-pay | Source: Home / Self Care | Attending: Internal Medicine

## 2020-05-30 DIAGNOSIS — I70261 Atherosclerosis of native arteries of extremities with gangrene, right leg: Secondary | ICD-10-CM

## 2020-05-30 HISTORY — PX: LOWER EXTREMITY ANGIOGRAPHY: CATH118251

## 2020-05-30 LAB — BASIC METABOLIC PANEL
Anion gap: 6 (ref 5–15)
BUN: 16 mg/dL (ref 8–23)
CO2: 23 mmol/L (ref 22–32)
Calcium: 8.2 mg/dL — ABNORMAL LOW (ref 8.9–10.3)
Chloride: 110 mmol/L (ref 98–111)
Creatinine, Ser: 1.83 mg/dL — ABNORMAL HIGH (ref 0.44–1.00)
GFR, Estimated: 29 mL/min — ABNORMAL LOW (ref >60)
Glucose, Bld: 101 mg/dL — ABNORMAL HIGH (ref 70–99)
Potassium: 4 mmol/L (ref 3.5–5.1)
Sodium: 139 mmol/L (ref 135–145)

## 2020-05-30 LAB — CBC
HCT: 21.8 % — ABNORMAL LOW (ref 36.0–46.0)
Hemoglobin: 7.3 g/dL — ABNORMAL LOW (ref 12.0–15.0)
MCH: 31.1 pg (ref 26.0–34.0)
MCHC: 33.5 g/dL (ref 30.0–36.0)
MCV: 92.8 fL (ref 80.0–100.0)
Platelets: 201 10*3/uL (ref 150–400)
RBC: 2.35 MIL/uL — ABNORMAL LOW (ref 3.87–5.11)
RDW: 15 % (ref 11.5–15.5)
WBC: 7.1 10*3/uL (ref 4.0–10.5)
nRBC: 0 % (ref 0.0–0.2)

## 2020-05-30 LAB — GLUCOSE, CAPILLARY
Glucose-Capillary: 96 mg/dL (ref 70–99)
Glucose-Capillary: 89 mg/dL (ref 70–99)
Glucose-Capillary: 117 mg/dL — ABNORMAL HIGH (ref 70–99)
Glucose-Capillary: 134 mg/dL — ABNORMAL HIGH (ref 70–99)
Glucose-Capillary: 162 mg/dL — ABNORMAL HIGH (ref 70–99)
Glucose-Capillary: 213 mg/dL — ABNORMAL HIGH (ref 70–99)

## 2020-05-30 LAB — MAGNESIUM: Magnesium: 1.9 mg/dL (ref 1.7–2.4)

## 2020-05-30 LAB — IRON AND TIBC
Iron: 27 ug/dL — ABNORMAL LOW (ref 28–170)
Saturation Ratios: 26 % (ref 10.4–31.8)
TIBC: 104 ug/dL — ABNORMAL LOW (ref 250–450)
UIBC: 77 ug/dL

## 2020-05-30 LAB — FERRITIN: Ferritin: 108 ng/mL (ref 11–307)

## 2020-05-30 SURGERY — LOWER EXTREMITY ANGIOGRAPHY
Anesthesia: Moderate Sedation | Laterality: Right

## 2020-05-30 MED ORDER — HEPARIN SODIUM (PORCINE) 1000 UNIT/ML IJ SOLN
INTRAMUSCULAR | Status: AC
  Filled 2020-05-30: qty 1

## 2020-05-30 MED ORDER — DEXTROSE-NACL 5-0.45 % IV SOLN: INTRAVENOUS | Status: DC

## 2020-05-30 MED ORDER — MIDAZOLAM HCL 2 MG/2ML IJ SOLN
INTRAMUSCULAR | Status: AC
  Filled 2020-05-30: qty 2

## 2020-05-30 MED ORDER — FAMOTIDINE 20 MG PO TABS: 40.0000 mg | ORAL_TABLET | Freq: Once | ORAL | Status: DC | PRN

## 2020-05-30 MED ORDER — DIPHENHYDRAMINE HCL 50 MG/ML IJ SOLN: 50.0000 mg | Freq: Once | INTRAMUSCULAR | Status: DC | PRN

## 2020-05-30 MED ORDER — FENTANYL CITRATE (PF) 100 MCG/2ML IJ SOLN
INTRAMUSCULAR | Status: DC | PRN
  Administered 2020-05-30: 25 ug via INTRAVENOUS
  Administered 2020-05-30: 50 ug via INTRAVENOUS

## 2020-05-30 MED ORDER — IODIXANOL 320 MG/ML IV SOLN
INTRAVENOUS | Status: DC | PRN
  Administered 2020-05-30: 20 mL

## 2020-05-30 MED ORDER — SODIUM CHLORIDE 0.9 % IV SOLN: INTRAVENOUS | Status: DC

## 2020-05-30 MED ORDER — FENTANYL CITRATE (PF) 100 MCG/2ML IJ SOLN
INTRAMUSCULAR | Status: AC
  Filled 2020-05-30: qty 2

## 2020-05-30 MED ORDER — METHYLPREDNISOLONE SODIUM SUCC 125 MG IJ SOLR: 125.0000 mg | Freq: Once | INTRAMUSCULAR | Status: DC | PRN

## 2020-05-30 MED ORDER — CEFAZOLIN SODIUM-DEXTROSE 2-4 GM/100ML-% IV SOLN
2.0000 g | INTRAVENOUS | Status: AC
  Administered 2020-05-31: 2 g via INTRAVENOUS
  Filled 2020-05-30: qty 100

## 2020-05-30 MED ORDER — ONDANSETRON HCL 4 MG/2ML IJ SOLN: 4.0000 mg | Freq: Four times a day (QID) | INTRAMUSCULAR | Status: DC | PRN

## 2020-05-30 MED ORDER — MIDAZOLAM HCL 2 MG/2ML IJ SOLN
INTRAMUSCULAR | Status: DC | PRN
  Administered 2020-05-30 (×2): 1 mg via INTRAVENOUS

## 2020-05-30 MED ORDER — CEFAZOLIN SODIUM-DEXTROSE 1-4 GM/50ML-% IV SOLN
1.0000 g | Freq: Once | INTRAVENOUS | Status: AC
  Filled 2020-05-30: qty 50

## 2020-05-30 MED ORDER — HEPARIN SODIUM (PORCINE) 1000 UNIT/ML IJ SOLN
INTRAMUSCULAR | Status: DC | PRN
  Administered 2020-05-30: 4000 [IU] via INTRAVENOUS

## 2020-05-30 MED ORDER — CEFAZOLIN SODIUM-DEXTROSE 1-4 GM/50ML-% IV SOLN
INTRAVENOUS | Status: AC
  Administered 2020-05-30: 1 g via INTRAVENOUS
  Filled 2020-05-30: qty 50

## 2020-05-30 MED ORDER — MIDAZOLAM HCL 5 MG/5ML IJ SOLN
INTRAMUSCULAR | Status: AC
  Filled 2020-05-30: qty 5

## 2020-05-30 MED ORDER — MIDAZOLAM HCL 2 MG/ML PO SYRP: 8.0000 mg | ORAL_SOLUTION | Freq: Once | ORAL | Status: DC | PRN

## 2020-05-30 MED ORDER — HYDROMORPHONE HCL 1 MG/ML IJ SOLN: 1.0000 mg | Freq: Once | INTRAMUSCULAR | Status: DC | PRN

## 2020-05-30 MED ORDER — CHLORHEXIDINE GLUCONATE 4 % EX LIQD: 60.0000 mL | Freq: Once | CUTANEOUS | Status: DC

## 2020-05-30 SURGICAL SUPPLY — 21 items
BALLN ULTRASCORE 014 3X100X150 (BALLOONS) ×2
BALLN ULTRSCOR 014 2.5X100X150 (BALLOONS) ×2
BALLN UTLRAVERSE 2X60X150 (BALLOONS) ×2
BALLOON ULTRSC 014 2.5X100X150 (BALLOONS) ×1 IMPLANT
BALLOON ULTRSCRE 014 3X100X150 (BALLOONS) ×1 IMPLANT
BALLOON UTLRAVERSE 2X60X150 (BALLOONS) ×1 IMPLANT
DEVICE RAD COMP TR BAND LRG (VASCULAR PRODUCTS) ×2 IMPLANT
DEVICE TORQUE (MISCELLANEOUS) ×2 IMPLANT
DRAPE INCISE 23X17 IOBAN STRL (DRAPES) ×1
DRAPE INCISE IOBAN 23X17 STRL (DRAPES) ×1 IMPLANT
GLIDECATH 4FR STR (CATHETERS) ×2 IMPLANT
GLIDEWIRE ADV .014X300CM (WIRE) ×2 IMPLANT
GLIDEWIRE STIFF .35X180X3 HYDR (WIRE) ×2 IMPLANT
KIT ENCORE 26 ADVANTAGE (KITS) ×2 IMPLANT
NEEDLE ENTRY 21GA 7CM ECHOTIP (NEEDLE) ×2 IMPLANT
PACK ANGIOGRAPHY (CUSTOM PROCEDURE TRAY) ×2 IMPLANT
SET INTRO CAPELLA COAXIAL (SET/KITS/TRAYS/PACK) ×2 IMPLANT
SHEATH HALO 035 5FRX10 (SHEATH) ×2 IMPLANT
SHEATH MICROPUNCTURE PEDAL 4FR (SHEATH) ×2 IMPLANT
SHIELD X-DRAPE GOLD 12X17 (MISCELLANEOUS) ×2 IMPLANT
WIRE GUIDERIGHT .035X150 (WIRE) ×2 IMPLANT

## 2020-05-30 NOTE — Progress Notes (Signed)
Pt is back from procedure, A&O x4, offers no complaints of pain or discomfort at this time.

## 2020-05-30 NOTE — Anesthesia Preprocedure Evaluation (Addendum)
Anesthesia Evaluation  Patient identified by MRN, date of birth, ID band Patient awake    Reviewed: Allergy & Precautions, NPO status , Patient's Chart, lab work & pertinent test results, reviewed documented beta blocker date and time   Airway Mallampati: III       Dental  (+) Missing, Poor Dentition   Pulmonary neg pulmonary ROS,    Pulmonary exam normal        Cardiovascular hypertension, Normal cardiovascular exam     Neuro/Psych negative neurological ROS  negative psych ROS   GI/Hepatic Neg liver ROS, GERD  ,  Endo/Other  diabetesHypothyroidism   Renal/GU Renal disease  negative genitourinary   Musculoskeletal   Abdominal Normal abdominal exam  (+)   Peds negative pediatric ROS (+)  Hematology  (+) anemia ,   Anesthesia Other Findings Past Medical History: No date: Diabetes mellitus without complication ([place]) 78/29/5621: Humerus fracture     Comment:  left No date: Hyperlipidemia No date: Hypertension No date: Hypothyroidism No date: Vertigo     Comment:  several yrs ago No date: Wears dentures     Comment:  partial upper and lower (loose)   Reproductive/Obstetrics                           Anesthesia Physical Anesthesia Plan  ASA: III  Anesthesia Plan: General   Post-op Pain Management:    Induction: Intravenous  PONV Risk Score and Plan: TIVA  Airway Management Planned: Nasal Cannula  Additional Equipment:   Intra-op Plan:   Post-operative Plan:   Informed Consent: I have reviewed the patients History and Physical, chart, labs and discussed the procedure including the risks, benefits and alternatives for the proposed anesthesia with the patient or authorized representative who has indicated his/her understanding and acceptance.     Dental advisory given  Plan Discussed with: CRNA and Surgeon  Anesthesia Plan Comments:        Anesthesia Quick Evaluation                                   Anesthesia Evaluation  Patient identified by MRN, date of birth, ID band Patient awake    Reviewed: Allergy & Precautions, H&P , NPO status , Patient's Chart, lab work & pertinent test results  Airway Mallampati: III  TM Distance: >3 FB     Dental  (+) Chipped, Missing   Pulmonary neg pulmonary ROS,    breath sounds clear to auscultation       Cardiovascular hypertension, +CHF (moderately elevated PA pressures on echo, normal RV function.  EF 30%, diastolic parameters indeterminate)   Rhythm:regular Rate:Normal     Neuro/Psych negative neurological ROS  negative psych ROS   GI/Hepatic negative GI ROS, Neg liver ROS,   Endo/Other  diabetesHypothyroidism   Renal/GU negative Renal ROS  negative genitourinary   Musculoskeletal   Abdominal   Peds  Hematology negative hematology ROS (+)   Anesthesia Other Findings obese  Past Medical History: No date: CHF (congestive heart failure) (HCC) No date: Diabetes mellitus without complication ([place]) 86/57/8469: Humerus fracture     Comment:  left No date: Hyperlipidemia No date: Hypertension No date: Hypothyroidism No date: Vertigo     Comment:  several yrs ago No date: Wears dentures     Comment:  partial upper and lower (loose)  Past Surgical History: 12/01/2018: CATARACT EXTRACTION W/PHACO; Left  Comment:  Procedure: CATARACT EXTRACTION PHACO AND INTRAOCULAR               LENS PLACEMENT ([place]) LEFT DIABETIC;  Surgeon: Birder Robson, MD;  Location: [facility];  Service:               Ophthalmology;  Laterality: Left;  Diabetic - insulin No date: FOOT SURGERY; Right     Comment:  lesion excision     Reproductive/Obstetrics negative OB ROS                            Anesthesia Physical Anesthesia Plan  ASA: III  Anesthesia Plan: General   Post-op Pain Management:    Induction:   PONV Risk Score and Plan:  Propofol infusion and TIVA  Airway Management Planned:   Additional Equipment:   Intra-op Plan:   Post-operative Plan:   Informed Consent: I have reviewed the patients History and Physical, chart, labs and discussed the procedure including the risks, benefits and alternatives for the proposed anesthesia with the patient or authorized representative who has indicated his/her understanding and acceptance.     Dental Advisory Given  Plan Discussed with: Anesthesiologist  Anesthesia Plan Comments:        Anesthesia Quick Evaluation

## 2020-05-30 NOTE — Op Note (Signed)
[facility] Percutaneous Study/Intervention Procedural Note   Date of Surgery: 05/30/2020  Surgeon: [name]  Pre-operative Diagnosis: Atherosclerotic occlusive disease bilateral lower extremities with ulceration of the right lower extremity heel and gangrenous changes to the toe.  Post-operative diagnosis: Same  Procedure(s) Performed: 1.  Introduction catheter into right lower extremity 3rd order catheter placement  2. Contrast injection right lower extremity for distal runoff  3.  Ultrasound-guided access to the anterior tibial artery             4.   Percutaneous transluminal angioplasty right anterior tibial artery to 3 mm 4. Sheath is removed and pressure is held at the end of the case  Anesthesia: Conscious sedation was administered under my direct supervision by the interventional radiology RN. IV Versed plus fentanyl were utilized. Continuous ECG, pulse oximetry and blood pressure was monitored throughout the entire procedure.  Conscious sedation was for a total of 47 minutes and 42 seconds.  Sheath: 5 French halo retrograde right anterior tibial artery  Contrast: 20 cc  Fluoroscopy Time: 3.6 minutes  Indications: TYSHAY ADEE presents with increasing pain of the right lower extremity.  She has gangrenous changes to her toe with exposed bone as well as ulceration of the right heel and associated with a Charcot joint.  This suggests the patient is having limb threatening ischemia and is at risk for limb loss.  Previous angiogram demonstrated the iliofemoral inflow was fairly good however there is extensive tibial disease with occlusion over a short segment of the proximal anterior tibial.  The peroneal and posterior tibial are occluded throughout their entire length.  I was unable to cross the occlusion from an antegrade direction and so today she is being returned for treatment in the  retrograde direction.  The risks and benefits are reviewed all questions answered patient agrees to proceed.  Procedure:[name] [name] is a 75 y.o. y.o. female who was identified and appropriate procedural time out was performed. The patient was then placed supine on the table and prepped and draped in the usual sterile fashion for retrograde pedal access.   Ultrasound was brought to the field in a sterile sleeve.  The anterior tibial artery was identified by ultrasound down near the ankle.  It was echolucent and pulsatile indicating patency.  1% lidocaine was infiltrated in the soft tissues.  A micropuncture needle was then used to access the anterior tibial artery.  Image was recorded for the permanent record.  Microwire followed by a micro-sheath was then inserted followed by a 5 French halo sheath.  A 0.018 wire was then advanced in a retrograde fashion through the anterior tibial and negotiated into the popliteal artery.  Hand-injection in a retrograde fashion demonstrates the proximal one third of the anterior tibial has multiple lesions there is a 2 cm occlusion there is a greater than 70% stenosis at the origin there is a greater than 80% stenosis distal to the occlusion.  4000 units of heparin was then given and allowed to circulate.  Initially a 2 mm x 16 mm balloon is advanced across the lesions.  3 serial inflations are performed each to 12 atm for 30 to 60 seconds.  Next a 2.5 mm x 100 mm ultra score balloon is advanced and again to serial inflations were performed to 12 atm for approximately 1 minute.  Lastly a 3 mm x 100 mm ultra score balloon is advanced across this area and once again inflations are to 12 atm  for 1 minute.  A glide catheter is advanced over the wire and positioned in the mid popliteal hand-injection contrast then demonstrates wide patency of the distal popliteal as well as the anterior tibial.  There is now less than 10% residual stenosis throughout the treated  segment of the anterior tibial with forward flow down to the sheath.  After review of these images the sheath is pulled and direct pressure was held. There no immediate Complications.  Findings:  Initial imaging demonstrates the anterior tibial has a short segment occlusion as well as a greater than 60% stenosis at the origin as well as a greater than 80% stenosis distal to the occlusion.  These lesions encompass a length of approximately 120 mm.  Following angioplasty beginning at 2 mm and moving serially up to 3 mm there is now less than 10% residual stenosis with rapid filling of the anterior tibial  Summary: Successful recanalization right lower extremity for limb salvage   Disposition: Patient was taken to the recovery room in stable condition having tolerated the procedure well.  [name] 05/30/2020,1:02 PM

## 2020-05-30 NOTE — Progress Notes (Signed)
Patient resting comfortably in bed son is at the bedside.  I discussed the success of the angiogram today with the patient and her son.  We have reopened the anterior tibial.  I relayed this information to Dr. Luana Shu who is now able to move forward with more definitive surgical treatment of her foot.

## 2020-05-30 NOTE — Interval H&P Note (Signed)
History and Physical Interval Note:  05/30/2020 12:01 PM  [name] [name]  has presented today for surgery, with the diagnosis of Atherosclerotic disease with gangrene.  The various methods of treatment have been discussed with the patient and family. After consideration of risks, benefits and other options for treatment, the patient has consented to  Procedure(s) with comments: Lower Extremity Angiography (Right) - Pedal Approach as a surgical intervention.  The patient's history has been reviewed, patient examined, no change in status, stable for surgery.  I have reviewed the patient's chart and labs.  Questions were answered to the patient's satisfaction.     [name]

## 2020-05-30 NOTE — Progress Notes (Addendum)
PROGRESS NOTE    FUJIKO PICAZO  OMV:[id number] DOB: June 23, 1945 DOA: 05/17/2020 PCP: Marguerita Merles, MD    Brief Narrative:  [name] [name] a 75 y.o.femalewith medical history significant ofDM, HTN, HLD, CKD-IV, hypothyroidism, gout,whopresents with foot ulcer with infection in right second toe.  Pt states that she noted a ulcer in dorsalaspects ofrightsecondarytoe about1 week ago, which has been progressively worsening.Shenotedlittle pus drainage.  2/16-creatinine down to 3.17 2/17 creatinine improving, today 2.43 2/18-creatinine 2.03 2/19-buttocks less sore with cream use. Creatinine down to 1.79 2/20-stool less watery with addition of probiotics.  At 1.76 today 2/21-renal function stabilizing. No labs today 2/22- angiogram by vascular today  Consultants:   vascular, podiatry, nephrology  Procedures:   Antimicrobials:   Unasyn   Subjective: Has no complaints this am. Son at bedside  Objective: Vitals:   05/29/20 1626 05/29/20 1946 05/29/20 2306 05/30/20 0401  BP: (!) 112/44 (!) 117/56 (!) 123/57 (!) 121/55  Pulse: 64 61 62 (!) 58  Resp: 18 18 18 16   Temp: 98.7 F (37.1 C) 98.9 F (37.2 C) 98.7 F (37.1 C) 98.6 F (37 C)  TempSrc: Oral Oral Oral Oral  SpO2: 96% 96% 96% 92%  Weight:      Height:        Intake/Output Summary (Last 24 hours) at 05/30/2020 0824 Last data filed at 05/29/2020 2315 Gross per 24 hour  Intake 900 ml  Output 700 ml  Net 200 ml   Filed Weights   05/17/20 1201  Weight: 90 kg    Examination: NAD, calm CTA no wheeze rales rhonchi's Regular S1-S2 no murmurs rubs Soft benign positive bowel sounds No edema Neurologic alert oriented x3 Mood and affect appropriate in current setting  Data Reviewed: I have personally reviewed following labs and imaging studies  CBC: Recent Labs  Lab 05/24/20 1732 05/26/20 0757 05/29/20 0345 05/30/20 0520  WBC 7.9 7.6 7.9 7.1  NEUTROABS  --  4.3  --   --   HGB 8.0* 7.7*  7.4* 7.3*  HCT 24.3* 24.8* 22.4* 21.8*  MCV 93.1 94.7 91.8 92.8  PLT 184 184 190 628   Basic Metabolic Panel: Recent Labs  Lab 05/25/20 0515 05/26/20 0752 05/26/20 0958 05/27/20 0453 05/28/20 0445 05/30/20 0520  NA 138 137 134* 138  --  139  K 3.6 3.7 3.7 3.9  --  4.0  CL 110 110 109 109  --  110  CO2 18* 18* 17* 21*  --  23  GLUCOSE 83 85 142* 120*  --  101*  BUN 33* 23 24* 20  --  16  CREATININE 2.43* 1.99* 2.03* 1.79* 1.76* 1.83*  CALCIUM 7.9* 8.1* 7.9* 8.2*  --  8.2*  MG  --   --   --   --   --  1.9  PHOS 3.7 3.1  --   --   --   --    GFR: Estimated Creatinine Clearance: 31.6 mL/min (A) (by C-G formula based on SCr of 1.83 mg/dL (H)). Liver Function Tests: Recent Labs  Lab 05/24/20 0508 05/25/20 0515 05/26/20 0752  AST 15  --   --   ALT 8  --   --   ALKPHOS 51  --   --   BILITOT 0.7  --   --   PROT 6.7  --   --   ALBUMIN 2.3* 2.2* 2.2*   No results for input(s): LIPASE, AMYLASE in the last 168 hours. No results for input(s): AMMONIA in  the last 168 hours. Coagulation Profile: No results for input(s): INR, PROTIME in the last 168 hours. Cardiac Enzymes: No results for input(s): CKTOTAL, CKMB, CKMBINDEX, TROPONINI in the last 168 hours. BNP (last 3 results) No results for input(s): PROBNP in the last 8760 hours. HbA1C: No results for input(s): HGBA1C in the last 72 hours. CBG: Recent Labs  Lab 05/29/20 1144 05/29/20 1628 05/29/20 2044 05/30/20 0529 05/30/20 0754  GLUCAP 107* 87 126* 96 89   Lipid Profile: No results for input(s): CHOL, HDL, LDLCALC, TRIG, CHOLHDL, LDLDIRECT in the last 72 hours. Thyroid Function Tests: No results for input(s): TSH, T4TOTAL, FREET4, T3FREE, THYROIDAB in the last 72 hours. Anemia Panel: No results for input(s): VITAMINB12, FOLATE, FERRITIN, TIBC, IRON, RETICCTPCT in the last 72 hours. Sepsis Labs: No results for input(s): PROCALCITON, LATICACIDVEN in the last 168 hours.  No results found for this or any previous  visit (from the past 240 hour(s)).       Radiology Studies: No results found.      Scheduled Meds: . acidophilus  1 capsule Oral Daily  . amLODipine  10 mg Oral Daily  . aspirin  81 mg Oral Daily  . atorvastatin  40 mg Oral Daily  . carvedilol  25 mg Oral BID  . donepezil  10 mg Oral QHS  . heparin  5,000 Units Subcutaneous Q8H  . insulin aspart  0-5 Units Subcutaneous QHS  . insulin aspart  0-9 Units Subcutaneous TID WC  . levothyroxine  50 mcg Oral Q0600  . sodium chloride flush  3 mL Intravenous Q12H   Continuous Infusions: . sodium chloride 10 mL/hr at 05/22/20 0425  . ampicillin-sulbactam (UNASYN) IV 3 g (05/29/20 2328)  . dextrose 5 % and 0.45% NaCl      Assessment & Plan:   Principal Problem:   Diabetic foot infection_right second toe  Active Problems:   HTN (hypertension)   Hypothyroidism (acquired)   Hyperlipidemia   CKD (chronic kidney disease), stage IV (HCC)   Type II diabetes mellitus with renal manifestations (HCC)   GERD (gastroesophageal reflux disease)   Anysia Choi Brownis a 75 y.o.femalewith medical history significant ofDM, HTN, HLD, CKD-IV, hypothyroidism, gout,whopresents with foot ulcer with infection in right second toe.  Pt states that she noted a ulcer in dorsalaspects ofrightsecondarytoe about1 week ago, which has been progressively worsening.Shenotedlittle pus drainage.  Diabetic footulcer withinfection right second OFB:PZWCHEN does not have fever or leukocytosis. Clinically not septic. Hemodynamically stable. Dr. Cleda Mccreedy of podiatry, Dr. Delana Meyer of [facility] consulted. 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 Normal arterial waveforms at the level of the ankle. --Patient seen by vascular surgery Dr. Delana Meyer  --2/11-- revascularization unsuccessful. Repeat plan for next  Tuesday -- podiatry consult with Dr. Vickki Muff appreciated plans for possible amputation Sometime next week after second attempt for revascularization --2/15-- vascular procedure for second attempt to revascularization canceled today due to elevated creatinine 2/18 -preulcerative site to the right second toe appears to be stable.  Although bone does appear to be exposed at this level.  Right lateral heel ulceration appears to be callused over at this time and completely closed.   Continue with Prevalon boots  Continue with same dressing changes orders  2/22- have been waiting for angiogram to be done once renal function is stable.  Renal function has stabilized 0.4 angiogram today by Dr. Delana Meyer.  She is status post  successful recanalization of the right lower extremity for limb salvation -Per podiatry continue antibiotics Plan: Plan for right second toe amputation on 05/31/2020 May consider ID consultation post surgery in am    HTN (hypertension) Stable  Continue amlodipine and Coreg  IV hydralazine as needed    Anemia- likely multifactoria, with blood draw, ckd,dilutional. Will ck stool occult Ck anemia panel   Hypothyroidism (acquired) Continue Synthroid  Hyperlipidemia Continue Lipitor  Acute on CKD (chronic kidney disease), stage IV ([place]): suspected contrast induced nephropathy (IV dye used on 2/11) Baseline creatinine 1.6-1.9 recently. - Her creatinine is 1.96 --angiogram---1.98--3.17>>>2.42>>>1.79>>>1.83 Improved with ivf. Nephrology following Monitor levels closely after todays angiogram    Type II diabetes mellitus with renal manifestations [facility]): Recent A1c 7.0,  Blood glucose levels controlled R-ISS Hold Lantus due to low sugars   GERD (gastroesophageal reflux disease) Continue Protonix  At baseline patient has been bedbound for more than seven months. She is taken care of by son and other family members at home.    DVT prophylaxis: Heparin Code  Status: Full Family Communication: son at bedside  Status is: Inpatient  Remains inpatient appropriate because:Inpatient level of care appropriate due to severity of illness   Dispo: The patient is from: Home              Anticipated d/c is to: TBD              Anticipated d/c date is: > 3 days              Patient currently is not medically stable to d/c.   Difficult to place patient No  Patient remains in-house secondary to diabetic foot ulcer with possible osteomyelitis.  Status post angiogram with unsuccessful attempt to revascularization. Today pt went for angiogram after renal function stablized. Plan for amputation in am.             LOS: 13 days   Time spent: 35 min with >50% on coc    Nolberto Hanlon, MD [facility] Pager [phone]  If 7PM-7AM, please contact night-coverage 05/30/2020, 8:24 AM

## 2020-05-31 ENCOUNTER — Inpatient Hospital Stay: Payer: Medicare Other | Admitting: Anesthesiology

## 2020-05-31 ENCOUNTER — Encounter: Payer: Self-pay | Admitting: Internal Medicine

## 2020-05-31 ENCOUNTER — Encounter
Admission: EM | Disposition: A | Discharge status: Home Health | Payer: Self-pay | Source: Home / Self Care | Attending: Internal Medicine

## 2020-05-31 ENCOUNTER — Inpatient Hospital Stay: Payer: Medicare Other

## 2020-05-31 DIAGNOSIS — L97519 Non-pressure chronic ulcer of other part of right foot with unspecified severity: Secondary | ICD-10-CM | POA: Diagnosis not present

## 2020-05-31 DIAGNOSIS — T508X5A Adverse effect of diagnostic agents, initial encounter: Secondary | ICD-10-CM | POA: Diagnosis not present

## 2020-05-31 DIAGNOSIS — E11628 Type 2 diabetes mellitus with other skin complications: Secondary | ICD-10-CM | POA: Diagnosis not present

## 2020-05-31 DIAGNOSIS — E11621 Type 2 diabetes mellitus with foot ulcer: Secondary | ICD-10-CM | POA: Diagnosis not present

## 2020-05-31 HISTORY — PX: AMPUTATION TOE: SHX6595

## 2020-05-31 LAB — CBC
HCT: 23.8 % — ABNORMAL LOW (ref 36.0–46.0)
Hemoglobin: 7.5 g/dL — ABNORMAL LOW (ref 12.0–15.0)
MCH: 30.1 pg (ref 26.0–34.0)
MCHC: 31.5 g/dL (ref 30.0–36.0)
MCV: 95.6 fL (ref 80.0–100.0)
Platelets: 205 10*3/uL (ref 150–400)
RBC: 2.49 MIL/uL — ABNORMAL LOW (ref 3.87–5.11)
RDW: 15.1 % (ref 11.5–15.5)
WBC: 8 10*3/uL (ref 4.0–10.5)
nRBC: 0 % (ref 0.0–0.2)

## 2020-05-31 LAB — GLUCOSE, CAPILLARY
Glucose-Capillary: 93 mg/dL (ref 70–99)
Glucose-Capillary: 91 mg/dL (ref 70–99)
Glucose-Capillary: 103 mg/dL — ABNORMAL HIGH (ref 70–99)
Glucose-Capillary: 125 mg/dL — ABNORMAL HIGH (ref 70–99)

## 2020-05-31 LAB — BASIC METABOLIC PANEL
Anion gap: 8 (ref 5–15)
BUN: 16 mg/dL (ref 8–23)
CO2: 22 mmol/L (ref 22–32)
Calcium: 8.2 mg/dL — ABNORMAL LOW (ref 8.9–10.3)
Chloride: 108 mmol/L (ref 98–111)
Creatinine, Ser: 1.88 mg/dL — ABNORMAL HIGH (ref 0.44–1.00)
GFR, Estimated: 28 mL/min — ABNORMAL LOW (ref >60)
Glucose, Bld: 108 mg/dL — ABNORMAL HIGH (ref 70–99)
Potassium: 4.3 mmol/L (ref 3.5–5.1)
Sodium: 138 mmol/L (ref 135–145)

## 2020-05-31 LAB — MRSA PCR SCREENING: MRSA by PCR: POSITIVE — AB

## 2020-05-31 SURGERY — AMPUTATION, TOE
Anesthesia: General | Site: Toe | Laterality: Right

## 2020-05-31 MED ORDER — ONDANSETRON HCL 4 MG/2ML IJ SOLN: 4.0000 mg | Freq: Once | INTRAMUSCULAR | Status: DC | PRN

## 2020-05-31 MED ORDER — PROPOFOL 10 MG/ML IV BOLUS
INTRAVENOUS | Status: DC | PRN
  Administered 2020-05-31: 30 mg via INTRAVENOUS

## 2020-05-31 MED ORDER — NEOMYCIN-POLYMYXIN B GU 40-200000 IR SOLN
Status: DC | PRN
  Administered 2020-05-31: 2 mL

## 2020-05-31 MED ORDER — NEOMYCIN-POLYMYXIN B GU 40-200000 IR SOLN
Status: AC
  Filled 2020-05-31: qty 2

## 2020-05-31 MED ORDER — SODIUM CHLORIDE 0.9 % IV SOLN: INTRAVENOUS | Status: DC

## 2020-05-31 MED ORDER — VANCOMYCIN HCL 1000 MG IV SOLR
INTRAVENOUS | Status: AC
  Filled 2020-05-31: qty 1000

## 2020-05-31 MED ORDER — PROPOFOL 500 MG/50ML IV EMUL
INTRAVENOUS | Status: DC | PRN
  Administered 2020-05-31: 100 ug/kg/min via INTRAVENOUS

## 2020-05-31 MED ORDER — PROPOFOL 500 MG/50ML IV EMUL
INTRAVENOUS | Status: AC
  Filled 2020-05-31: qty 50

## 2020-05-31 MED ORDER — BUPIVACAINE HCL (PF) 0.5 % IJ SOLN
INTRAMUSCULAR | Status: AC
  Filled 2020-05-31: qty 30

## 2020-05-31 MED ORDER — CHLORHEXIDINE GLUCONATE CLOTH 2 % EX PADS
6.0000 | MEDICATED_PAD | Freq: Every day | CUTANEOUS | Status: AC
  Administered 2020-05-31 – 2020-06-04 (×5): 6 via TOPICAL

## 2020-05-31 MED ORDER — MUPIROCIN 2 % EX OINT
1.0000 "application " | TOPICAL_OINTMENT | Freq: Two times a day (BID) | CUTANEOUS | Status: AC
  Administered 2020-05-31 – 2020-06-03 (×8): 1 via NASAL
  Filled 2020-05-31 (×3): qty 22

## 2020-05-31 MED ORDER — FENTANYL CITRATE (PF) 100 MCG/2ML IJ SOLN: 25.0000 ug | INTRAMUSCULAR | Status: DC | PRN

## 2020-05-31 MED ORDER — BUPIVACAINE HCL 0.5 % IJ SOLN
INTRAMUSCULAR | Status: DC | PRN
  Administered 2020-05-31: 10 mL

## 2020-05-31 SURGICAL SUPPLY — 48 items
BLADE MED AGGRESSIVE (BLADE) ×2 IMPLANT
BLADE OSC/SAGITTAL MD 5.5X18 (BLADE) IMPLANT
BLADE SURG 15 STRL LF DISP TIS (BLADE) IMPLANT
BLADE SURG 15 STRL SS (BLADE)
BLADE SURG MINI STRL (BLADE) IMPLANT
BNDG CONFORM 2 STRL LF (GAUZE/BANDAGES/DRESSINGS) IMPLANT
BNDG ELASTIC 4X5.8 VLCR STR LF (GAUZE/BANDAGES/DRESSINGS) ×2 IMPLANT
BNDG ESMARK 4X12 TAN STRL LF (GAUZE/BANDAGES/DRESSINGS) ×2 IMPLANT
BNDG GAUZE 4.5X4.1 6PLY STRL (MISCELLANEOUS) ×2 IMPLANT
CANISTER SUCT 1200ML W/VALVE (MISCELLANEOUS) ×2 IMPLANT
CNTNR SPEC 2.5X3XGRAD LEK (MISCELLANEOUS) ×1
CONT SPEC 4OZ STER OR WHT (MISCELLANEOUS) ×1
CONTAINER SPEC 2.5X3XGRAD LEK (MISCELLANEOUS) ×1 IMPLANT
COVER WAND RF STERILE (DRAPES) ×2 IMPLANT
CUFF TOURN DUAL PL 12 NO SLV (MISCELLANEOUS) IMPLANT
CUFF TOURN SGL QUICK 18X4 (TOURNIQUET CUFF) IMPLANT
DRAPE FLUOR MINI C-ARM 54X84 (DRAPES) IMPLANT
DURAPREP 26ML APPLICATOR (WOUND CARE) ×2 IMPLANT
ELECT REM PT RETURN 9FT ADLT (ELECTROSURGICAL) ×2
ELECTRODE REM PT RTRN 9FT ADLT (ELECTROSURGICAL) ×1 IMPLANT
GAUZE SPONGE 4X4 12PLY STRL (GAUZE/BANDAGES/DRESSINGS) ×4 IMPLANT
GAUZE XEROFORM 1X8 LF (GAUZE/BANDAGES/DRESSINGS) ×2 IMPLANT
GLOVE SURG ENC MOIS LTX SZ7 (GLOVE) ×2 IMPLANT
GLOVE SURG UNDER LTX SZ7 (GLOVE) ×2 IMPLANT
GOWN STRL REUS W/ TWL LRG LVL3 (GOWN DISPOSABLE) ×2 IMPLANT
GOWN STRL REUS W/TWL LRG LVL3 (GOWN DISPOSABLE) ×2
HANDPIECE VERSAJET DEBRIDEMENT (MISCELLANEOUS) IMPLANT
KIT TURNOVER KIT A (KITS) ×2 IMPLANT
LABEL OR SOLS (LABEL) ×2 IMPLANT
MANIFOLD NEPTUNE II (INSTRUMENTS) ×2 IMPLANT
NDL FILTER BLUNT 18X1 1/2 (NEEDLE) ×1 IMPLANT
NDL HYPO 25X1 1.5 SAFETY (NEEDLE) ×2 IMPLANT
NEEDLE FILTER BLUNT 18X 1/2SAF (NEEDLE) ×1
NEEDLE FILTER BLUNT 18X1 1/2 (NEEDLE) ×1 IMPLANT
NEEDLE HYPO 25X1 1.5 SAFETY (NEEDLE) ×4 IMPLANT
NS IRRIG 500ML POUR BTL (IV SOLUTION) ×2 IMPLANT
PACK EXTREMITY ARMC (MISCELLANEOUS) ×2 IMPLANT
SOL .9 NS 3000ML IRR  AL (IV SOLUTION)
SOL .9 NS 3000ML IRR UROMATIC (IV SOLUTION) IMPLANT
SOL PREP PVP 2OZ (MISCELLANEOUS) ×2
SOLUTION PREP PVP 2OZ (MISCELLANEOUS) ×1 IMPLANT
STOCKINETTE STRL 6IN 960660 (GAUZE/BANDAGES/DRESSINGS) ×2 IMPLANT
SUT ETHILON 3-0 FS-10 30 BLK (SUTURE) ×4
SUT VIC AB 3-0 SH 27 (SUTURE) ×1
SUT VIC AB 3-0 SH 27X BRD (SUTURE) ×1 IMPLANT
SUTURE EHLN 3-0 FS-10 30 BLK (SUTURE) ×2 IMPLANT
SWAB CULTURE AMIES ANAERIB BLU (MISCELLANEOUS) IMPLANT
SYR 10ML LL (SYRINGE) ×2 IMPLANT

## 2020-05-31 NOTE — Progress Notes (Signed)
PROGRESS NOTE    [name] [name]  QQP:[id number] DOB: [birth year] DOA: 05/17/2020 PCP: Marguerita Merles, MD  Brief Narrative:[name] F Brownis a 75 y.o.femalewith medical history significant ofDM, HTN, HLD, CKD-IV, hypothyroidism, gout,whopresents with foot ulcer with infection in right second toe.  Pt states that she noted a ulcer in dorsalaspects ofrightsecondarytoe about1 week ago, which has been progressively worsening.Shenotedlittle pus drainage.  2/16-creatinine down to 3.17 2/17 creatinine improving, today 2.43 2/18-creatinine 2.03 2/19-buttocks less sore with cream use. Creatinine down to 1.79 2/20-stool less watery with addition of probiotics.  At 1.76 today 2/21-renal function stabilizing. No labs today 2/22- angiogram by vascular today 2/23: N.p.o. for operative intervention with podiatry today    Assessment & Plan:   Principal Problem:   Diabetic foot infection_right second toe  Active Problems:   HTN (hypertension)   Hypothyroidism (acquired)   Hyperlipidemia   CKD (chronic kidney disease), stage IV (HCC)   Type II diabetes mellitus with renal manifestations ([place])   GERD (gastroesophageal reflux disease)  Diabetic foot ulcer with concomitant infection Vascular and podiatry on board ABI nondiagnostic Initial revascularization attempts unsuccessful on 2/11 Subsequent attempts were canceled due to worsening kidney function Successful angiogram on 2/22.  Successful recanalization of right lower extremity Plan: N.p.o. for second right toe amputation today As needed pain control Anticipate need for ID consultation postsurgical intervention  HTN (hypertension) Stable  Continue amlodipine and Coreg  IV hydralazine as needed   Anemia Likely multifactorial Suspect anemia of chronic disease versus dilutional effect No indication of blood loss No indication for current transfusion Check daily CBC   Hypothyroidism (acquired) Continue  Synthroid  Hyperlipidemia Continue Lipitor  Acute onCKD (chronic kidney disease), stage IV (HCC):suspected contrast induced nephropathy (IV dye used on 2/11) Baseline creatinine 1.6-1.9 recently. -Her creatinine is 1.96 --angiogram---1.98--3.17>>>2.42>>>1.79>>>1.83 Improved with ivf. Nephrology following Monitor levels closely after contrast exposure    Type II diabetes mellitus with renal manifestations (HCC)  Recent A1c 7.0,  Blood glucose levels controlled R-ISS Hold Lantus due to low sugars   GERD (gastroesophageal reflux disease) Continue Protonix      DVT prophylaxis: SQ heparin Code Status: Full Family Communication: None today  disposition Plan: Status is: Inpatient  Remains inpatient appropriate because:Inpatient level of care appropriate due to severity of illness   Dispo: The patient is from: Home              Anticipated d/c is to: Home              Anticipated d/c date is: 2 days              Patient currently is not medically stable to d/c.   Difficult to place patient No  Anticipate discharge back home and in the care of the family.  Operative intervention today.  Possible ID consultation tomorrow.    Level of care: Med-Surg  Consultants:   Podiatry  Vascular surgery  Procedures:   Right lower extremity angiogram, 2/22  Right second toe amputation, 2/23  Antimicrobials:   Unasyn    Subjective: Patient seen and examined.  Pain well controlled.  No acute issues.  N.p.o. for procedure.  Objective: Vitals:   05/30/20 2334 05/31/20 0450 05/31/20 0801 05/31/20 1144  BP: (!) 141/61 (!) 112/49 (!) 116/97 (!) 104/49  Pulse: (!) 59 60 73 65  Resp: 18 20 19 18   Temp: 98.3 F (36.8 C) 98.4 F (36.9 C) 98.4 F (36.9 C) 98.5 F (36.9 C)  TempSrc:  Oral  Oral  SpO2: 96% 92% 95% 93%  Weight:      Height:        Intake/Output Summary (Last 24 hours) at 05/31/2020 1533 Last data filed at 05/31/2020 0445 Gross per 24 hour   Intake 64.53 ml  Output 800 ml  Net -735.47 ml   Filed Weights   05/17/20 1201 05/30/20 1057  Weight: 90 kg 97.5 kg    Examination:  General exam: Appears calm and comfortable  Respiratory system: Clear to auscultation. Respiratory effort normal. Cardiovascular system: S1 & S2 heard, RRR. No JVD, murmurs, rubs, gallops or clicks. No pedal edema. Gastrointestinal system: Abdomen is nondistended, soft and nontender. No organomegaly or masses felt. Normal bowel sounds heard. Central nervous system: Alert and oriented. No focal neurological deficits. Extremities: Symmetric 5 x 5 power. Skin: Right second toe necrotic appearing Psychiatry: Judgement and insight appear normal. Mood & affect appropriate.     Data Reviewed: I have personally reviewed following labs and imaging studies  CBC: Recent Labs  Lab 05/24/20 1732 05/26/20 0757 05/29/20 0345 05/30/20 0520 05/31/20 0523  WBC 7.9 7.6 7.9 7.1 8.0  NEUTROABS  --  4.3  --   --   --   HGB 8.0* 7.7* 7.4* 7.3* 7.5*  HCT 24.3* 24.8* 22.4* 21.8* 23.8*  MCV 93.1 94.7 91.8 92.8 95.6  PLT 184 184 190 201 650   Basic Metabolic Panel: Recent Labs  Lab 05/25/20 0515 05/26/20 0752 05/26/20 0958 05/27/20 0453 05/28/20 0445 05/30/20 0520 05/31/20 0523  NA 138 137 134* 138  --  139 138  K 3.6 3.7 3.7 3.9  --  4.0 4.3  CL 110 110 109 109  --  110 108  CO2 18* 18* 17* 21*  --  23 22  GLUCOSE 83 85 142* 120*  --  101* 108*  BUN 33* 23 24* 20  --  16 16  CREATININE 2.43* 1.99* 2.03* 1.79* 1.76* 1.83* 1.88*  CALCIUM 7.9* 8.1* 7.9* 8.2*  --  8.2* 8.2*  MG  --   --   --   --   --  1.9  --   PHOS 3.7 3.1  --   --   --   --   --    GFR: Estimated Creatinine Clearance: 32 mL/min (A) (by C-G formula based on SCr of 1.88 mg/dL (H)). Liver Function Tests: Recent Labs  Lab 05/25/20 0515 05/26/20 0752  ALBUMIN 2.2* 2.2*   No results for input(s): LIPASE, AMYLASE in the last 168 hours. No results for input(s): AMMONIA in the last  168 hours. Coagulation Profile: No results for input(s): INR, PROTIME in the last 168 hours. Cardiac Enzymes: No results for input(s): CKTOTAL, CKMB, CKMBINDEX, TROPONINI in the last 168 hours. BNP (last 3 results) No results for input(s): PROBNP in the last 8760 hours. HbA1C: No results for input(s): HGBA1C in the last 72 hours. CBG: Recent Labs  Lab 05/30/20 1309 05/30/20 1627 05/30/20 2029 05/31/20 0733 05/31/20 1131  GLUCAP 134* 162* 213* 93 91   Lipid Profile: No results for input(s): CHOL, HDL, LDLCALC, TRIG, CHOLHDL, LDLDIRECT in the last 72 hours. Thyroid Function Tests: No results for input(s): TSH, T4TOTAL, FREET4, T3FREE, THYROIDAB in the last 72 hours. Anemia Panel: Recent Labs    05/30/20 0520  FERRITIN 108  TIBC 104*  IRON 27*   Sepsis Labs: No results for input(s): PROCALCITON, LATICACIDVEN in the last 168 hours.  Recent Results (from the past 240 hour(s))  MRSA PCR Screening  Status: Abnormal   Collection Time: 05/30/20 11:34 PM   Specimen: Nasal Mucosa; Nasopharyngeal  Result Value Ref Range Status   MRSA by PCR POSITIVE (A) NEGATIVE Final    Comment:        The GeneXpert MRSA Assay (FDA approved for NASAL specimens only), is one component of a comprehensive MRSA colonization surveillance program. It is not intended to diagnose MRSA infection nor to guide or monitor treatment for MRSA infections. RESULT CALLED TO, READ BACK BY AND VERIFIED WITH: Georgeanna Lea AMMONS RN [phone] 05/31/20 HNM Performed at [facility], [address] [place], [place] [address]          Radiology Studies: PERIPHERAL VASCULAR CATHETERIZATION  Result Date: 05/30/2020 See Op Note        Scheduled Meds: . acidophilus  1 capsule Oral Daily  . amLODipine  10 mg Oral Daily  . aspirin  81 mg Oral Daily  . atorvastatin  40 mg Oral Daily  . carvedilol  25 mg Oral BID  . chlorhexidine  60 mL Topical Once  . Chlorhexidine Gluconate Cloth  6 each  Topical Q0600  . donepezil  10 mg Oral QHS  . heparin  5,000 Units Subcutaneous Q8H  . insulin aspart  0-5 Units Subcutaneous QHS  . insulin aspart  0-9 Units Subcutaneous TID WC  . levothyroxine  50 mcg Oral Q0600  . mupirocin ointment  1 application Nasal BID  . sodium chloride flush  3 mL Intravenous Q12H   Continuous Infusions: . sodium chloride 10 mL/hr at 05/22/20 0425  . ampicillin-sulbactam (UNASYN) IV 3 g (05/31/20 2595)  .  ceFAZolin (ANCEF) IV Stopped (05/31/20 1438)  . dextrose 5 % and 0.45% NaCl 50 mL/hr at 05/30/20 0940     LOS: 14 days    Time spent: 25 minutes    Sidney Ace, MD [facility] Pager [phone]  If 7PM-7AM, please contact night-coverage 05/31/2020, 3:33 PM

## 2020-05-31 NOTE — Plan of Care (Signed)

## 2020-05-31 NOTE — H&P (Addendum)
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  The various methods of treatment have been discussed with the patient.  No guarantees were given.  After consideration of risks, benefits and other options for treatment, the patient has consented to surgery.  I have reviewed the patients' chart and labs.    PROCEDURE: RIGHT 2ND TOE AMPUTATION   A history and physical examination was performed in the hospital.  The patient was reexamined.  There have been no changes to this history and physical examination.  Caroline More, DPM

## 2020-05-31 NOTE — Transfer of Care (Signed)
Immediate Anesthesia Transfer of Care Note  Patient: [name] [name]  Procedure(s) Performed: AMPUTATION TOE (Right Toe)  Patient Location: PACU  Anesthesia Type:General  Level of Consciousness: drowsy and patient cooperative  Airway & Oxygen Therapy: Patient Spontanous Breathing  Post-op Assessment: Report given to RN and Post -op Vital signs reviewed and stable  Post vital signs: Reviewed and stable  Last Vitals:  Vitals Value Taken Time  BP 109/53 05/31/20 1721  Temp 36.2 C 05/31/20 1721  Pulse 67 05/31/20 1726  Resp 17 05/31/20 1726  SpO2 95 % 05/31/20 1726  Vitals shown include unvalidated device data.  Last Pain:  Vitals:   05/31/20 1721  TempSrc:   PainSc: 0-No pain      Patients Stated Pain Goal: 0 (97/02/63 7858)  Complications: No complications documented.

## 2020-05-31 NOTE — Progress Notes (Signed)
[facility] Daily Progress Note  Subjective: 05/30/20: 1.  Introduction catheter into right lower extremity 3rd order catheter placement  2. Contrast injection right lower extremity for distal runoff  3.  Ultrasound-guided access to the anterior tibial artery             4.   Percutaneous transluminal angioplasty right anterior tibial artery to 3 mm 4. Sheath is removed and pressure is held at the end of the case  Patient without complaint this afternoon.  No issues overnight.  Objective: Vitals:   05/31/20 0450 05/31/20 0801 05/31/20 1144 05/31/20 1616  BP: (!) 112/49 (!) 116/97 (!) 104/49 133/64  Pulse: 60 73 65 65  Resp: 20 19 18 16   Temp: 98.4 F (36.9 C) 98.4 F (36.9 C) 98.5 F (36.9 C) 98.4 F (36.9 C)  TempSrc: Oral  Oral Tympanic  SpO2: 92% 95% 93%   Weight:    97.5 kg  Height:    5\' 7"  (1.702 m)    Intake/Output Summary (Last 24 hours) at 05/31/2020 1622 Last data filed at 05/31/2020 0445 Gross per 24 hour  Intake 64.53 ml  Output 800 ml  Net -735.47 ml   Physical Exam: A&Ox3, NAD CV: RRR Pulmonary: CTA Bilaterally Abdomen: Soft, Nontender, Nondistended Vascular:  Right Lower Extremity: Thigh soft.  Calf soft.  Access site clean dry intact.  Extremities warm distally toes.  Good capillary refill.   Laboratory: CBC    Component Value Date/Time   WBC 8.0 05/31/2020 0523   HGB 7.5 (L) 05/31/2020 0523   HGB 10.7 (L) 04/17/2014 1131   HCT 23.8 (L) 05/31/2020 0523   HCT 32.9 (L) 04/17/2014 1131   PLT 205 05/31/2020 0523   PLT 226 04/17/2014 1131   BMET    Component Value Date/Time   NA 138 05/31/2020 0523   NA 142 04/17/2014 1131   K 4.3 05/31/2020 0523   K 4.0 04/17/2014 1131   CL 108 05/31/2020 0523   CL 109 (H) 04/17/2014 1131   CO2 22 05/31/2020 0523   CO2 24 04/17/2014 1131   GLUCOSE 108 (H) 05/31/2020 0523   GLUCOSE 89 04/17/2014 1131   BUN 16 05/31/2020 0523   BUN  22 (H) 04/17/2014 1131   CREATININE 1.88 (H) 05/31/2020 0523   CREATININE 1.78 (H) 04/17/2014 1131   CALCIUM 8.2 (L) 05/31/2020 0523   CALCIUM 8.8 04/17/2014 1131   GFRNONAA 28 (L) 05/31/2020 0523   GFRNONAA 30 (L) 04/17/2014 1131   GFRNONAA 37 (L) 04/22/2013 0946   GFRAA 29 (L) 12/07/2019 0645   GFRAA 60 (L) 04/17/2014 1131   GFRAA 42 (L) 04/22/2013 0946   Assessment/Planning: The patient is a 75 year old female with known history of atherosclerotic disease to the lower extremity status post right lower extremity angiogram with intervention x2  1) s/p successful revascularization to the right lower extremity 2) on aspirin and statin for medical management 3) no further recommendations from vascular surgery at this time. 4) we will continue to monitor the patient's atherosclerotic disease in the outpatient setting  Discussed with Dr. [facility] PA-C 05/31/2020 4:22 PM

## 2020-05-31 NOTE — Op Note (Signed)
PODIATRY / FOOT AND ANKLE SURGERY OPERATIVE REPORT    SURGEON: Caroline More, DPM  PRE-OPERATIVE DIAGNOSIS:  1.  Right second toe osteomyelitis 2.  Peripheral vascular disease 3.  Diabetes type 2 polyneuropathy  POST-OPERATIVE DIAGNOSIS: Same  PROCEDURE(S): 1. Right second toe amputation  HEMOSTASIS: No tourniquet  ANESTHESIA: MAC  ESTIMATED BLOOD LOSS: 20 cc  FINDING(S): 1.  Right second toe osteomyelitis at the PIPJ with open bone exposed through wound  PATHOLOGY/SPECIMEN(S): Right second toe wound culture, right second toe path specimen with proximal margin marked in purple ink  INDICATIONS:   [name] [name] is a 75 y.o. female who presents with a nonhealing ulceration to the dorsal aspect of the right second PIPJ.  Patient was seen by Dr. Cleda Mccreedy in an outpatient setting and was noted to have bone exposed consistent with osteomyelitis.  Patient was sent to the hospital due to concern for infection as well as peripheral vascular disease status need for potential revascularization prior to any surgical procedure.  Patient had successful revascularization and presents today for further surgical intervention involving right second toe amputation.  Discussed all treatment options with patient and patient's family both conservative and surgical attempts at correction at this time they have elected for surgical procedure consisting of right second toe amputation..  DESCRIPTION: After obtaining full informed written consent, the patient was brought back to the operating room and placed supine upon the operating table.  The patient received IV antibiotics prior to induction.  After obtaining adequate anesthesia, 10 cc of half percent Marcaine plain was injected and a second ray type block.  The patient was prepped and draped in the standard fashion.  No tourniquet was applied or inflated during the case.  Attention was directed to the right second toe where an elliptical type of incision was  made around the base of the proximal phalanx of the second toe with arms extending proximally in a dorsal and plantar direction over the second metatarsal phalangeal joint.  The incision was made directly to bone.  At this time an extensor tenotomy and capsulotomy was performed followed by release of the collateral and suspensory ligaments as well as the plantar plate and any connection to the flexor tendon and the second toe was disarticulated and passed off the operative site.  A wound culture was taken from the open wound and passed off the operative site and the path specimen was passed off the operative site with the proximal margin marked in purple ink.  The surgical site was flushed with copious amounts normal sterile saline.  The extensor and flexor tendons were cut as far proximally as possible.  The deep tissues were then reapproximated well coapted along with the subcutaneous tissues with 3-0 Vicryl.  The skin was then reapproximated well coapted with 3-0 nylon in a combination of horizontal mattress and simple type stitching.  The tissues appeared to be fairly healthy and viable during the procedure and did have a fair amount of bleeding which was able to be controlled with electrocautery.  Hemostasis appeared to be well achieved once the incision was completely closed.  A postoperative dressing was applied consisting of Xeroform to the incision line followed by 4 x 4 gauze, Kerlix, ABD, Ace wrap.  Patient tolerated the procedure and anesthesia well was transferred to recovery in vital signs stable vascular status intact all toes the right foot.  Patient be discharged back to the inpatient room with the appropriate orders and instructions as well as medications.  Patient  may begin partial weightbearing if able to but when resting in bed needs to be wearing the Prevalon boots at all times.  PT ordered.  We will continue to monitor patient and likely change dressing tomorrow and potentially sign off at  that point.  COMPLICATIONS: None  CONDITION: Good, stable  Caroline More

## 2020-06-01 ENCOUNTER — Encounter: Payer: Self-pay | Admitting: Podiatry

## 2020-06-01 LAB — CBC WITH DIFFERENTIAL/PLATELET
Abs Immature Granulocytes: 0.03 10*3/uL (ref 0.00–0.07)
Basophils Absolute: 0 10*3/uL (ref 0.0–0.1)
Basophils Relative: 1 %
Eosinophils Absolute: 0.1 10*3/uL (ref 0.0–0.5)
Eosinophils Relative: 1 %
HCT: 23.8 % — ABNORMAL LOW (ref 36.0–46.0)
Hemoglobin: 7.9 g/dL — ABNORMAL LOW (ref 12.0–15.0)
Immature Granulocytes: 0 %
Lymphocytes Relative: 27 %
Lymphs Abs: 2.2 10*3/uL (ref 0.7–4.0)
MCH: 31 pg (ref 26.0–34.0)
MCHC: 33.2 g/dL (ref 30.0–36.0)
MCV: 93.3 fL (ref 80.0–100.0)
Monocytes Absolute: 0.9 10*3/uL (ref 0.1–1.0)
Monocytes Relative: 11 %
Neutro Abs: 5 10*3/uL (ref 1.7–7.7)
Neutrophils Relative %: 60 %
Platelets: 212 10*3/uL (ref 150–400)
RBC: 2.55 MIL/uL — ABNORMAL LOW (ref 3.87–5.11)
RDW: 15.1 % (ref 11.5–15.5)
WBC: 8.2 10*3/uL (ref 4.0–10.5)
nRBC: 0 % (ref 0.0–0.2)

## 2020-06-01 LAB — BASIC METABOLIC PANEL
Anion gap: 9 (ref 5–15)
BUN: 14 mg/dL (ref 8–23)
CO2: 20 mmol/L — ABNORMAL LOW (ref 22–32)
Calcium: 8.3 mg/dL — ABNORMAL LOW (ref 8.9–10.3)
Chloride: 108 mmol/L (ref 98–111)
Creatinine, Ser: 1.94 mg/dL — ABNORMAL HIGH (ref 0.44–1.00)
GFR, Estimated: 27 mL/min — ABNORMAL LOW (ref >60)
Glucose, Bld: 95 mg/dL (ref 70–99)
Potassium: 4.1 mmol/L (ref 3.5–5.1)
Sodium: 137 mmol/L (ref 135–145)

## 2020-06-01 LAB — GLUCOSE, CAPILLARY
Glucose-Capillary: 93 mg/dL (ref 70–99)
Glucose-Capillary: 114 mg/dL — ABNORMAL HIGH (ref 70–99)
Glucose-Capillary: 122 mg/dL — ABNORMAL HIGH (ref 70–99)
Glucose-Capillary: 116 mg/dL — ABNORMAL HIGH (ref 70–99)

## 2020-06-01 MED ORDER — GABAPENTIN 100 MG PO CAPS
200.0000 mg | ORAL_CAPSULE | Freq: Two times a day (BID) | ORAL | Status: DC
  Administered 2020-06-01 – 2020-06-05 (×9): 200 mg via ORAL
  Filled 2020-06-01 (×8): qty 2

## 2020-06-01 MED ORDER — HYDROMORPHONE HCL 1 MG/ML IJ SOLN: 0.5000 mg | INTRAMUSCULAR | Status: DC | PRN

## 2020-06-01 MED ORDER — OXYCODONE HCL 5 MG PO TABS
5.0000 mg | ORAL_TABLET | ORAL | Status: DC | PRN
  Administered 2020-06-05: 5 mg via ORAL
  Filled 2020-06-01: qty 1

## 2020-06-01 MED ORDER — AMOXICILLIN-POT CLAVULANATE 875-125 MG PO TABS
1.0000 | ORAL_TABLET | Freq: Two times a day (BID) | ORAL | Status: DC
  Administered 2020-06-01 – 2020-06-02 (×2): 1 via ORAL
  Filled 2020-06-01 (×2): qty 1

## 2020-06-01 NOTE — Progress Notes (Signed)
PROGRESS NOTE    [name] [name]  CWC:[id number] DOB: 07-19-1945 DOA: 05/17/2020 PCP: Marguerita Merles, MD  Brief Narrative:[name] F Brownis a 75 y.o.femalewith medical history significant ofDM, HTN, HLD, CKD-IV, hypothyroidism, gout,whopresents with foot ulcer with infection in right second toe.  Pt states that she noted a ulcer in dorsalaspects ofrightsecondarytoe about1 week ago, which has been progressively worsening.Shenotedlittle pus drainage.  2/16-creatinine down to 3.17 2/17 creatinine improving, today 2.43 2/18-creatinine 2.03 2/19-buttocks less sore with cream use. Creatinine down to 1.79 2/20-stool less watery with addition of probiotics.  At 1.76 today 2/21-renal function stabilizing. No labs today 2/22- angiogram by vascular today 2/23: N.p.o. for operative intervention with podiatry today 2/24: Status post toe amputation.  Wound looks clean.  No dehiscence.  Surgical margins clear.    Assessment & Plan:   Principal Problem:   Diabetic foot infection_right second toe  Active Problems:   HTN (hypertension)   Hypothyroidism (acquired)   Hyperlipidemia   CKD (chronic kidney disease), stage IV (HCC)   Type II diabetes mellitus with renal manifestations (HCC)   GERD (gastroesophageal reflux disease)  Diabetic foot ulcer with concomitant infection Vascular and podiatry on board ABI nondiagnostic Initial revascularization attempts unsuccessful on 2/11 Subsequent attempts were canceled due to worsening kidney function Successful angiogram on 2/22.  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 Augmentin Complete 7 days from today Pain control as needed Therapy evaluations Anticipate discharge home with home health tomorrow  HTN (hypertension) Stable  Continue amlodipine and Coreg  IV hydralazine as needed    Anemia Likely multifactorial Suspect anemia of chronic disease versus dilutional effect No indication of blood loss No indication for current transfusion Check daily CBC   Hypothyroidism (acquired) Continue Synthroid  Hyperlipidemia Continue Lipitor  Acute onCKD (chronic kidney disease), stage IV (HCC):suspected contrast induced nephropathy (IV dye used on 2/11) Baseline creatinine 1.6-1.9 recently. -Her creatinine is 1.96 --angiogram---1.98--3.17>>>2.42>>>1.79>>>1.83 Improved with ivf. Nephrology following Monitor levels closely after contrast exposure    Type II diabetes mellitus with renal manifestations (HCC)  Recent A1c 7.0,  Blood glucose levels controlled R-ISS Hold Lantus due to low sugars   GERD (gastroesophageal reflux disease) Continue Protonix      DVT prophylaxis: SQ heparin Code Status: Full Family Communication: None today  disposition Plan: Status is: Inpatient  Remains inpatient appropriate because:Inpatient level of care appropriate due to severity of illness   Dispo: The patient is from: Home              Anticipated d/c is to: Home              Patient currently is not medically stable to d/c.   Difficult to place patient No   Patient remains hemodynamically stable and pain control optimized anticipate discharge home with home health to 2/25          Level of care: Med-Surg  Consultants:   Podiatry  Vascular surgery  Procedures:   Right lower extremity angiogram, 2/22  Right second toe amputation, 2/23  Antimicrobials:   Unasyn    Subjective: Patient seen and examined.  Postop day one status post toe amputation.  Some postoperative pain endorsed.  No other complaints  Objective: Vitals:   06/01/20 0009 06/01/20 0511 06/01/20 0747 06/01/20 1148  BP: 132/71 (!) 144/77 (!) 138/57 138/71  Pulse: 61 62 61 61  Resp: 18 18 16 18   Temp:  98.3 F (36.8 C) 98.7 F (37.1 C) 98.2 F (36.8 C) 98 F  (36.7 C)  TempSrc:    Oral  SpO2: 92% 96% 98% 96%  Weight:      Height:        Intake/Output Summary (Last 24 hours) at 06/01/2020 1444 Last data filed at 06/01/2020 0400 Gross per 24 hour  Intake 840 ml  Output 1201 ml  Net -361 ml   Filed Weights   05/17/20 1201 05/30/20 1057 05/31/20 1616  Weight: 90 kg 97.5 kg 97.5 kg    Examination:  General exam: Appears calm and comfortable  Respiratory system: Clear to auscultation. Respiratory effort normal. Cardiovascular system: S1 & S2 heard, RRR. No JVD, murmurs, rubs, gallops or clicks. No pedal edema. Gastrointestinal system: Abdomen is nondistended, soft and nontender. No organomegaly or masses felt. Normal bowel sounds heard. Central nervous system: Alert and oriented. No focal neurological deficits. Extremities: Bilateral lower extremities in boots and surgical dressings, not removed Skin: Feet in surgical dressings, not removed Psychiatry: Judgement and insight appear normal. Mood & affect appropriate.     Data Reviewed: I have personally reviewed following labs and imaging studies  CBC: Recent Labs  Lab 05/26/20 0757 05/29/20 0345 05/30/20 0520 05/31/20 0523 06/01/20 0824  WBC 7.6 7.9 7.1 8.0 8.2  NEUTROABS 4.3  --   --   --  5.0  HGB 7.7* 7.4* 7.3* 7.5* 7.9*  HCT 24.8* 22.4* 21.8* 23.8* 23.8*  MCV 94.7 91.8 92.8 95.6 93.3  PLT 184 190 201 205 456   Basic Metabolic Panel: Recent Labs  Lab 05/26/20 0752 05/26/20 0958 05/27/20 0453 05/28/20 0445 05/30/20 0520 05/31/20 0523 06/01/20 0824  NA 137 134* 138  --  139 138 137  K 3.7 3.7 3.9  --  4.0 4.3 4.1  CL 110 109 109  --  110 108 108  CO2 18* 17* 21*  --  23 22 20*  GLUCOSE 85 142* 120*  --  101* 108* 95  BUN 23 24* 20  --  16 16 14   CREATININE 1.99* 2.03* 1.79* 1.76* 1.83* 1.88* 1.94*  CALCIUM 8.1* 7.9* 8.2*  --  8.2* 8.2* 8.3*  MG  --   --   --   --  1.9  --   --   PHOS 3.1  --   --   --   --   --   --    GFR: Estimated Creatinine Clearance: 31  mL/min (A) (by C-G formula based on SCr of 1.94 mg/dL (H)). Liver Function Tests: Recent Labs  Lab 05/26/20 0752  ALBUMIN 2.2*   No results for input(s): LIPASE, AMYLASE in the last 168 hours. No results for input(s): AMMONIA in the last 168 hours. Coagulation Profile: No results for input(s): INR, PROTIME in the last 168 hours. Cardiac Enzymes: No results for input(s): CKTOTAL, CKMB, CKMBINDEX, TROPONINI in the last 168 hours. BNP (last 3 results) No results for input(s): PROBNP in the last 8760 hours. HbA1C: No results for input(s): HGBA1C in the last 72 hours. CBG: Recent Labs  Lab 05/31/20 1131 05/31/20 1724 05/31/20 2054 06/01/20 0747 06/01/20 1149  GLUCAP 91 103* 125* 93 114*   Lipid Profile: No results for input(s): CHOL, HDL, LDLCALC, TRIG, CHOLHDL, LDLDIRECT in the last 72 hours. Thyroid Function Tests: No results for input(s): TSH, T4TOTAL, FREET4, T3FREE, THYROIDAB in the last 72 hours. Anemia Panel: Recent Labs    05/30/20 0520  FERRITIN 108  TIBC 104*  IRON 27*  Sepsis Labs: No results for input(s): PROCALCITON, LATICACIDVEN in the last 168 hours.  Recent Results (from the past 240 hour(s))  MRSA PCR Screening     Status: Abnormal   Collection Time: 05/30/20 11:34 PM   Specimen: Nasal Mucosa; Nasopharyngeal  Result Value Ref Range Status   MRSA by PCR POSITIVE (A) NEGATIVE Final    Comment:        The GeneXpert MRSA Assay (FDA approved for NASAL specimens only), is one component of a comprehensive MRSA colonization surveillance program. It is not intended to diagnose MRSA infection nor to guide or monitor treatment for MRSA infections. RESULT CALLED TO, READ BACK BY AND VERIFIED WITH: Georgeanna Lea AMMONS RN [phone] 05/31/20 HNM Performed at [facility], [address] [place], [place] [address]   Anaerobic culture w Gram Stain     Status: None (Preliminary result)   Collection Time: 05/31/20  4:18 PM   Specimen: Toe, Right;  Amputation  Result Value Ref Range Status   Specimen Description   Final    TOE Performed at [facility], [address], [place], [place] [address]    Special Requests   Final    NONE Performed at [facility], [address] [place], [place] [address]    Gram Stain   Final    NO WBC SEEN NO ORGANISMS SEEN Performed at [facility], [place] [address] [place], [place] [address]    Culture PENDING  Incomplete   Report Status PENDING  Incomplete         Radiology Studies: DG MINI C-ARM IMAGE ONLY  Result Date: 05/31/2020 There is no interpretation for this exam.  This order is for images obtained during a surgical procedure.  Please See "Surgeries" Tab for more information regarding the procedure.        Scheduled Meds: . acidophilus  1 capsule Oral Daily  . amLODipine  10 mg Oral Daily  . amoxicillin-clavulanate  1 tablet Oral Q12H  . aspirin  81 mg Oral Daily  . atorvastatin  40 mg Oral Daily  . carvedilol  25 mg Oral BID  . Chlorhexidine Gluconate Cloth  6 each Topical Q0600  . donepezil  10 mg Oral QHS  . gabapentin  200 mg Oral BID  . heparin  5,000 Units Subcutaneous Q8H  . insulin aspart  0-5 Units Subcutaneous QHS  . insulin aspart  0-9 Units Subcutaneous TID WC  . levothyroxine  50 mcg Oral Q0600  . mupirocin ointment  1 application Nasal BID  . sodium chloride flush  3 mL Intravenous Q12H   Continuous Infusions: . sodium chloride 10 mL/hr at 05/22/20 0425  . sodium chloride       LOS: 15 days    Time spent: 15 minutes    Sidney Ace, MD [facility] Pager [phone]  If 7PM-7AM, please contact night-coverage 06/01/2020, 2:44 PM

## 2020-06-01 NOTE — TOC Progression Note (Signed)
Transition of Care [facility]) - Progression Note    Patient Details  Name: [name] [name] MRN: [id number] Date of Birth: Aug 22, 1945  Transition of Care [facility]) CM/SW [place], RN Phone Number: 06/01/2020, 10:03 AM  Clinical Narrative:  RNCM reached out patient via phone. She reports to not feeling great this morning, still somewhat sick she thinks related to the surgery yesterday. Patient reports it is her plan to return home with her son at discharge and that she has home health and they are working on getting her set up with some Medicaid services.  RNCM reached out to patient's son to [place] to confirm information and he verified everything including that the home health is through [facility].  RNCM reached out to [place] with [facility] and he confirmed patient is open to services.           Expected Discharge Plan and Services                                                 Social Determinants of Health (SDOH) Interventions    Readmission Risk Interventions Readmission Risk Prevention Plan 06/01/2020 05/18/2020 11/19/2019  Transportation Screening Complete Complete Complete  PCP or Specialist Appt within 3-5 Days - Complete Complete  HRI or [place] - Complete Complete  Social Work Consult for [place] Planning/Counseling - Complete Complete  Palliative Care Screening - Not Applicable Complete  Medication Review Press photographer) Complete Complete Complete  PCP or Specialist appointment within 3-5 days of discharge Complete - -  SW Recovery Care/Counseling Consult Complete - -  Palliative Care Screening Not Applicable - -  [place] Not Applicable - -  Some recent data might be hidden

## 2020-06-01 NOTE — Progress Notes (Signed)
Daily Progress Note   Subjective  - 1 Day Post-Op Follow-up second toe amputation.  Patient's biggest complaint today is knee pain.   Objective Vitals:   06/01/20 0009 06/01/20 0511 06/01/20 0747 06/01/20 1148  BP: 132/71 (!) 144/77 (!) 138/57 138/71  Pulse: 61 62 61 61  Resp: 18 18 16 18   Temp: 98.3 F (36.8 C) 98.7 F (37.1 C) 98.2 F (36.8 C) 98 F (36.7 C)  TempSrc:    Oral  SpO2: 92% 96% 98% 96%  Weight:      Height:        Physical Exam: Second toe amputation site is completely coapted.  No dehiscence at all.  No signs of infection.    Laboratory CBC    Component Value Date/Time   WBC 8.2 06/01/2020 0824   HGB 7.9 (L) 06/01/2020 0824   HGB 10.7 (L) 04/17/2014 1131   HCT 23.8 (L) 06/01/2020 0824   HCT 32.9 (L) 04/17/2014 1131   PLT 212 06/01/2020 0824   PLT 226 04/17/2014 1131    BMET    Component Value Date/Time   NA 137 06/01/2020 0824   NA 142 04/17/2014 1131   K 4.1 06/01/2020 0824   K 4.0 04/17/2014 1131   CL 108 06/01/2020 0824   CL 109 (H) 04/17/2014 1131   CO2 20 (L) 06/01/2020 0824   CO2 24 04/17/2014 1131   GLUCOSE 95 06/01/2020 0824   GLUCOSE 89 04/17/2014 1131   BUN 14 06/01/2020 0824   BUN 22 (H) 04/17/2014 1131   CREATININE 1.94 (H) 06/01/2020 0824   CREATININE 1.78 (H) 04/17/2014 1131   CALCIUM 8.3 (L) 06/01/2020 0824   CALCIUM 8.8 04/17/2014 1131   GFRNONAA 27 (L) 06/01/2020 0824   GFRNONAA 30 (L) 04/17/2014 1131   GFRNONAA 37 (L) 04/22/2013 0946   GFRAA 29 (L) 12/07/2019 0645   GFRAA 37 (L) 04/17/2014 1131   GFRAA 42 (L) 04/22/2013 0946    Assessment/Planning: Gangrene status post second toe amputation   New dressing applied by myself.  This can be left clean dry and intact.  Keep all pressure off the heel as well.  From podiatry standpoint patient is stable for discharge.  Follow-up in outpatient clinic in 1 week.    Samara Deist A  06/01/2020, 1:10 PM

## 2020-06-01 NOTE — Progress Notes (Signed)
   06/01/20 1346  Clinical Encounter Type  Visited With Patient  Visit Type Initial;Social support;Spiritual support  Referral From Nurse  Consult/Referral To Chaplain   Chaplain responded to a Rapid Response page. Chaplain checked on PT and staff. Chaplain was a calming presence.

## 2020-06-02 LAB — GLUCOSE, CAPILLARY
Glucose-Capillary: 87 mg/dL (ref 70–99)
Glucose-Capillary: 120 mg/dL — ABNORMAL HIGH (ref 70–99)
Glucose-Capillary: 137 mg/dL — ABNORMAL HIGH (ref 70–99)
Glucose-Capillary: 94 mg/dL (ref 70–99)

## 2020-06-02 MED ORDER — GABAPENTIN 100 MG PO CAPS: 100.0000 mg | ORAL_CAPSULE | Freq: Two times a day (BID) | ORAL | 0 refills | Status: DC | PRN

## 2020-06-02 MED ORDER — OXYCODONE HCL 5 MG PO TABS: 5.0000 mg | ORAL_TABLET | Freq: Four times a day (QID) | ORAL | 0 refills | Status: AC | PRN

## 2020-06-02 MED ORDER — AMOXICILLIN-POT CLAVULANATE 875-125 MG PO TABS
1.0000 | ORAL_TABLET | Freq: Two times a day (BID) | ORAL | Status: DC
  Administered 2020-06-02 – 2020-06-05 (×6): 1 via ORAL
  Filled 2020-06-02 (×6): qty 1

## 2020-06-02 MED ORDER — RISAQUAD PO CAPS: 1.0000 | ORAL_CAPSULE | Freq: Every day | ORAL | 0 refills | Status: AC

## 2020-06-02 MED ORDER — AMOXICILLIN-POT CLAVULANATE 875-125 MG PO TABS: 1.0000 | ORAL_TABLET | Freq: Two times a day (BID) | ORAL | 0 refills | Status: DC

## 2020-06-02 NOTE — Clinical Social Work Note (Signed)
    Durable Medical Equipment  (From admission, onward)         Start     Ordered   06/02/20 1317  For home use only DME Hospital bed  Once       Question Answer Comment  Length of Need Lifetime   The above medical condition requires: Patient requires the ability to reposition frequently   Bed type Semi-electric   Support Surface: Gel Overlay      06/02/20 1317

## 2020-06-02 NOTE — Discharge Summary (Signed)
Physician Discharge Summary  [name] [name] NUU:[id number] DOB: April 24, 1945 DOA: 05/17/2020  PCP: Marguerita Merles, MD  Admit date: 05/17/2020 Discharge date: 06/02/2020  Admitted From: Home Disposition: Home with home health  Recommendations for Outpatient Follow-up:  1. Follow up with PCP in 1-2 weeks 2. Follow-up with podiatry as directed  Home Health: Yes Equipment/Devices: Hospital bed Discharge Condition: Stable CODE STATUS: Full Diet recommendation: Heart Healthy / Carb Modified   Brief/Interim Summary: [name] [name] a 75 y.o.femalewith medical history significant ofDM, HTN, HLD, CKD-IV, hypothyroidism, gout,whopresents with foot ulcer with infection in right second toe.  Pt states that she noted a ulcer in dorsalaspects ofrightsecondarytoe about1 week ago, which has been progressively worsening.Shenotedlittle pus drainage.  2/16-creatinine down to 3.17 2/17 creatinine improving, today 2.43 2/18-creatinine 2.03 2/19-buttocks less sore with cream use. Creatinine down to 1.79 2/20-stool less watery with addition of probiotics.At 1.76 today 2/21-renal function stabilizing. No labs today 2/22- angiogram by vascular today 2/23: N.p.o. for operative intervention with podiatry today 2/24: Status post toe amputation.  Wound looks clean.  No dehiscence.  Surgical margins clear. 2/25: Wound wound clean, no dehiscence.  Stable for discharge home with home health services.  Transition to p.o. Augmentin to complete additional 7 days.    Discharge Diagnoses:  Principal Problem:   Diabetic foot infection_right second toe  Active Problems:   HTN (hypertension)   Hypothyroidism (acquired)   Hyperlipidemia   CKD (chronic kidney disease), stage IV (HCC)   Type II diabetes mellitus with renal manifestations (HCC)   GERD (gastroesophageal reflux disease) Diabetic foot ulcer with concomitant infection Vascular and podiatry on board ABI nondiagnostic Initial  revascularization attempts unsuccessful on 2/11 Subsequent attempts were canceled due to worsening kidney function Successful angiogram on 2/22.  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 Augmentin Complete 7 days from today Pain control as needed Discharge home with home health services and home RN  HTN (hypertension) Stable  Continue amlodipine and Coreg   Anemia Likely multifactorial Suspect anemia of chronic disease versus dilutional effect No indication of blood loss No indication for transfusion    Hypothyroidism (acquired) Continue Synthroid  Hyperlipidemia Continue Lipitor  Acute onCKD (chronic kidney disease), stage IV (HCC):suspected contrast induced nephropathy (IV dye used on 2/11) Hospital course was complicated by acute kidney injury post contrast administration, suspected contrast-induced nephropathy.  Kidney function improving and at baseline at time of discharge.  Follow-up outpatient nephrology.    Type II diabetes mellitus with renal manifestations (HCC) Resume home regimen on discharge  GERD (gastroesophageal reflux disease) Continue Protonix   Discharge Instructions  Discharge Instructions    Diet - low sodium heart healthy   Complete by: As directed    Increase activity slowly   Complete by: As directed    No wound care   Complete by: As directed      Allergies as of 06/02/2020   No Known Allergies     Medication List    TAKE these medications   acidophilus Caps capsule Take 1 capsule by mouth daily. Start taking on: June 03, 2020   amLODipine 10 MG tablet Commonly known as: NORVASC Take 1 tablet by mouth daily.   amoxicillin-clavulanate 875-125 MG tablet Commonly known as: AUGMENTIN Take 1 tablet by mouth every 12 (twelve) hours for 6 days.   ASPIRIN 81 PO Take by mouth  daily.   atorvastatin 40 MG tablet  Commonly known as: LIPITOR Take 1 tablet by mouth at bedtime.   bisacodyl 10 MG suppository Commonly known as: Dulcolax Place 1 suppository (10 mg total) rectally as needed for moderate constipation.   carvedilol 25 MG tablet Commonly known as: COREG Take 1 tablet by mouth 2 (two) times daily.   donepezil 10 MG tablet Commonly known as: ARICEPT Take 10 mg by mouth at bedtime.   gabapentin 100 MG capsule Commonly known as: NEURONTIN Take 1 capsule (100 mg total) by mouth 2 (two) times daily as needed for up to 14 days.   insulin aspart protamine- aspart (70-30) 100 UNIT/ML injection Commonly known as: NOVOLOG MIX 70/30 Inject 15 Units into the skin 2 (two) times daily with a meal.   oxyCODONE 5 MG immediate release tablet Commonly known as: Oxy IR/ROXICODONE Take 1 tablet (5 mg total) by mouth every 6 (six) hours as needed for up to 3 days for severe pain.   polyethylene glycol 17 g packet Commonly known as: MIRALAX / GLYCOLAX Take 17 g by mouth daily. Mix one tablespoon with 8oz of your favorite juice or water every day until you are having soft formed stools. Then start taking once daily if you didn't have a stool the day before.   Synthroid 50 MCG tablet Generic drug: levothyroxine Take 1 tablet by mouth daily.   Tyler Aas FlexTouch 100 UNIT/ML FlexTouch Pen Generic drug: insulin degludec Inject 80 Units into the skin at bedtime.            Durable Medical Equipment  (From admission, onward)         Start     Ordered   06/02/20 1317  For home use only DME Hospital bed  Once       Question Answer Comment  Length of Need Lifetime   The above medical condition requires: Patient requires the ability to reposition frequently   Bed type Semi-electric   Support Surface: Gel Overlay      06/02/20 1317          Follow-up Information    Marguerita Merles, MD Follow up in 7 day(s).   Specialty: Family Medicine Why: Please  schedule follow up appointment within 5-7 days of discharge.  Contact information: [place] [address] [phone]        Schnier, Dolores Lory, MD Follow up in 1 month(s).   Specialties: Vascular Surgery, Cardiology, Radiology, Vascular Surgery Why: Can see Schnier or Arna Medici.  Will need ABI with visit. Contact information: [place] [address] [phone]              No Known Allergies  Consultations:  Podiatry   Procedures/Studies: PERIPHERAL VASCULAR CATHETERIZATION  Result Date: 05/30/2020 See Op Note   PERIPHERAL VASCULAR CATHETERIZATION  Result Date: 05/19/2020 See Op Note  US ARTERIAL ABI (SCREENING LOWER EXTREMITY)  Result Date: 05/18/2020 CLINICAL DATA:  75 year old female with history of diabetic foot infection. EXAM: NONINVASIVE PHYSIOLOGIC VASCULAR STUDY OF BILATERAL LOWER EXTREMITIES TECHNIQUE: Evaluation of both lower extremities were performed at rest, including calculation of ankle-brachial indices with single level Doppler, pressure and pulse volume recording. COMPARISON:  None. FINDINGS: Right ABI:  Unable to calculate due to noncompressible vessels. Left ABI:  Unable to calculate due to noncompressible vessels. Right Lower Extremity:  Normal arterial waveforms at the ankle. Left Lower Extremity:  Normal arterial waveforms at the ankle. 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 Normal arterial waveforms at  the level of the ankle. If there is clinical concern for significant lower extremity peripheral artery disease, recommend CTA, MRA, or direct angiography for further evaluation. Ruthann Cancer, MD [facility] Electronically Signed   By: Ruthann Cancer MD   On: 05/18/2020 10:19   DG Foot Complete Right  Result Date: 05/17/2020 CLINICAL DATA:  Pain and swelling in the second toe with  concern for osteomyelitis EXAM: RIGHT FOOT COMPLETE - 3+ VIEW COMPARISON:  None. FINDINGS: Chronic appearing severe deformity throughout the hindfoot with apparent ankylosis of the calcaneus and tarsal bones. Ankylosis of the first MTP joint. Degenerative changes throughout the tarsal metatarsal joints. No appreciable acute osseous erosions or periosteal reaction. No appreciable soft tissue gas. Soft tissue swelling throughout the right foot. No radiopaque foreign bodies. Vascular calcifications in the soft tissues. IMPRESSION: No specific radiographic findings of acute osteomyelitis. Diffuse soft tissue swelling. Chronic appearing severe deformity throughout the hindfoot with apparent ankylosis of the calcaneus and tarsal bones. Ankylosis of the first MTP joint. Degenerative changes throughout the tarsometatarsal joints. Findings suggests chronic neuropathic change. Electronically Signed   By: Ilona Sorrel M.D.   On: 05/17/2020 15:02   DG MINI C-ARM IMAGE ONLY  Result Date: 05/31/2020 There is no interpretation for this exam.  This order is for images obtained during a surgical procedure.  Please See "Surgeries" Tab for more information regarding the procedure.   (Echo, Carotid, EGD, Colonoscopy, ERCP)    Subjective: Seen and examined the day of discharge.  Stable, no distress.  Stable for discharge home with home health services.  Discharge Exam: Vitals:   06/02/20 0755 06/02/20 1128  BP: (!) 125/55 (!) 114/53  Pulse: (!) 55 65  Resp: 14 17  Temp: 98.6 F (37 C) 98.5 F (36.9 C)  SpO2: 93% 97%   Vitals:   06/01/20 2340 06/02/20 0403 06/02/20 0755 06/02/20 1128  BP: (!) 129/55 (!) 129/58 (!) 125/55 (!) 114/53  Pulse: (!) 54 (!) 55 (!) 55 65  Resp: 18 18 14 17   Temp: 98.5 F (36.9 C) 98.2 F (36.8 C) 98.6 F (37 C) 98.5 F (36.9 C)  TempSrc:      SpO2: 99% 94% 93% 97%  Weight:      Height:        General: Alert awake, no acute distress Cardiovascular: RRR, S1/S2 +, no rubs, no  gallops Respiratory: CTA bilaterally, no wheezing, no rhonchi Abdominal: Soft, NT, ND, bowel sounds + Extremities: Diffuse muscle wasting bilaterally.  Feet in boots.    The results of significant diagnostics from this hospitalization (including imaging, microbiology, ancillary and laboratory) are listed below for reference.     Microbiology: Recent Results (from the past 240 hour(s))  MRSA PCR Screening     Status: Abnormal   Collection Time: 05/30/20 11:34 PM   Specimen: Nasal Mucosa; Nasopharyngeal  Result Value Ref Range Status   MRSA by PCR POSITIVE (A) NEGATIVE Final    Comment:        The GeneXpert MRSA Assay (FDA approved for NASAL specimens only), is one component of a comprehensive MRSA colonization surveillance program. It is not intended to diagnose MRSA infection nor to guide or monitor treatment for MRSA infections. RESULT CALLED TO, READ BACK BY AND VERIFIED WITH: Georgeanna Lea AMMONS RN [phone] 05/31/20 HNM Performed at [facility] [place] [facility], [address] [place], [place] [address]   Anaerobic culture w Gram Stain     Status: None (Preliminary result)  Collection Time: 05/31/20  4:18 PM   Specimen: Toe, Right; Amputation  Result Value Ref Range Status   Specimen Description   Final    TOE Performed at [facility], [address], [place], [place] [address]    Special Requests   Final    NONE Performed at [facility], [address] [place], [place] [address]    Gram Stain NO WBC SEEN NO ORGANISMS SEEN   Final   Culture   Final    RARE STAPHYLOCOCCUS AUREUS CULTURE REINCUBATED FOR BETTER GROWTH Performed at [facility], [place] [address] [place], [place] [address]    Report Status PENDING  Incomplete     Labs: BNP (last 3 results) Recent Labs    10/10/19 1211  BNP 323.5*   Basic Metabolic Panel: Recent Labs  Lab 05/27/20 0453 05/28/20 0445 05/30/20 0520 05/31/20 0523 06/01/20 0824  NA 138  --  139 138 137   K 3.9  --  4.0 4.3 4.1  CL 109  --  110 108 108  CO2 21*  --  23 22 20*  GLUCOSE 120*  --  101* 108* 95  BUN 20  --  16 16 14   CREATININE 1.79* 1.76* 1.83* 1.88* 1.94*  CALCIUM 8.2*  --  8.2* 8.2* 8.3*  MG  --   --  1.9  --   --    Liver Function Tests: No results for input(s): AST, ALT, ALKPHOS, BILITOT, PROT, ALBUMIN in the last 168 hours. No results for input(s): LIPASE, AMYLASE in the last 168 hours. No results for input(s): AMMONIA in the last 168 hours. CBC: Recent Labs  Lab 05/29/20 0345 05/30/20 0520 05/31/20 0523 06/01/20 0824  WBC 7.9 7.1 8.0 8.2  NEUTROABS  --   --   --  5.0  HGB 7.4* 7.3* 7.5* 7.9*  HCT 22.4* 21.8* 23.8* 23.8*  MCV 91.8 92.8 95.6 93.3  PLT 190 201 205 212   Cardiac Enzymes: No results for input(s): CKTOTAL, CKMB, CKMBINDEX, TROPONINI in the last 168 hours. BNP: Invalid input(s): POCBNP CBG: Recent Labs  Lab 06/01/20 1149 06/01/20 1636 06/01/20 2056 06/02/20 0757 06/02/20 1129  GLUCAP 114* 122* 116* 87 120*   D-Dimer No results for input(s): DDIMER in the last 72 hours. Hgb A1c No results for input(s): HGBA1C in the last 72 hours. Lipid Profile No results for input(s): CHOL, HDL, LDLCALC, TRIG, CHOLHDL, LDLDIRECT in the last 72 hours. Thyroid function studies No results for input(s): TSH, T4TOTAL, T3FREE, THYROIDAB in the last 72 hours.  Invalid input(s): FREET3 Anemia work up No results for input(s): VITAMINB12, FOLATE, FERRITIN, TIBC, IRON, RETICCTPCT in the last 72 hours. Urinalysis    Component Value Date/Time   COLORURINE YELLOW (A) 12/04/2019 1159   APPEARANCEUR HAZY (A) 12/04/2019 1159   APPEARANCEUR Clear 04/21/2013 0715   LABSPEC 1.013 12/04/2019 1159   LABSPEC 1.015 04/21/2013 0715   PHURINE 7.0 12/04/2019 1159   GLUCOSEU NEGATIVE 12/04/2019 1159   GLUCOSEU 50 mg/dL 04/21/2013 0715   HGBUR SMALL (A) 12/04/2019 1159   BILIRUBINUR NEGATIVE 12/04/2019 1159   BILIRUBINUR Negative 04/21/2013 0715   KETONESUR  NEGATIVE 12/04/2019 1159   PROTEINUR NEGATIVE 12/04/2019 1159   NITRITE NEGATIVE 12/04/2019 1159   LEUKOCYTESUR TRACE (A) 12/04/2019 1159   LEUKOCYTESUR Negative 04/21/2013 0715   Sepsis Labs Invalid input(s): PROCALCITONIN,  WBC,  LACTICIDVEN Microbiology Recent Results (from the past 240 hour(s))  MRSA PCR Screening     Status: Abnormal  Collection Time: 05/30/20 11:34 PM   Specimen: Nasal Mucosa; Nasopharyngeal  Result Value Ref Range Status   MRSA by PCR POSITIVE (A) NEGATIVE Final    Comment:        The GeneXpert MRSA Assay (FDA approved for NASAL specimens only), is one component of a comprehensive MRSA colonization surveillance program. It is not intended to diagnose MRSA infection nor to guide or monitor treatment for MRSA infections. RESULT CALLED TO, READ BACK BY AND VERIFIED WITH: Georgeanna Lea AMMONS RN [phone] 05/31/20 HNM Performed at [facility], [address] [place], [place] [address]   Anaerobic culture w Gram Stain     Status: None (Preliminary result)   Collection Time: 05/31/20  4:18 PM   Specimen: Toe, Right; Amputation  Result Value Ref Range Status   Specimen Description   Final    TOE Performed at [facility], [address] [place], [place] [address]    Special Requests   Final    NONE Performed at [facility], [address] [place], [place] [address]    Gram Stain NO WBC SEEN NO ORGANISMS SEEN   Final   Culture   Final    RARE STAPHYLOCOCCUS AUREUS CULTURE REINCUBATED FOR BETTER GROWTH Performed at [facility], [place] [address], [place], [place] [address]    Report Status PENDING  Incomplete     Time coordinating discharge: Over 30 minutes  SIGNED:   Sidney Ace, MD  [facility] 06/02/2020, 1:45 PM Pager   If 7PM-7AM, please contact night-coverage

## 2020-06-02 NOTE — Evaluation (Signed)
Occupational Therapy Evaluation Patient Details Name: [name] [name] MRN: [id number] DOB: 20-Dec-1945 Today's Date: 06/02/2020    History of Present Illness 75 y.o. female with medical history significant of DM, HTN, HLD, CKD-IV, hypothyroidism, gout, who presents with  foot ulcer with infection in right second toe.   Pt states that she noted a ulcer in dorsal aspects of right secondary toe about 1 week ago, which has been progressively worsening.  She noted little pus drainage   Clinical Impression   Patient presenting with decreased I in self care, balance, functional mobility/transers, endurance, and safety awareness, and strength.  Patient reports 4 months ago before she feel at home she was able to transfer into wheelchair with assist from son. She needed assistance with self care tasks at that time as well. After falling, pt reports being bed bound at home with son providing all her care. Pt using briefs in bed for toileting with son then cleaning her. Pt has not been transferring or even sitting EOB from that point. Pt reports her goal as , " To walk again". OT provided total A to don surgical shoe and reviewed PWB heel only restrictions. Pt immediately refused to attempt standing or transfer. Pt was agreeable to EOB but needing total A to task. Pt needing max A initially progressing to min - mod A for static sitting balance. Pt also asking about lift for discharge but that does not line up with pts goal at this point. OT recommends hospital bed at discharge to ease caregiver burden and for safety at discharge. OT discussed possible short term rehab stay but pt declined.  Patient will benefit from acute OT to increase overall independence in the areas of ADLs, functional mobility, and safety awareness in order to safely discharge home with caregiver.    Follow Up Recommendations  Home health OT;Supervision/Assistance - 24 hour    Equipment Recommendations  Hospital bed       Precautions /  Restrictions Precautions Precautions: None      Mobility Bed Mobility Overal bed mobility: Needs Assistance Bed Mobility: Supine to Sit;Sit to Supine     Supine to sit: Max assist Sit to supine: Total assist   General bed mobility comments: assistance for B LEs and trunk with cuing for technique. HOB elevated.    Transfers                 General transfer comment: OT donned surgical shoe in bed but to refused to attempt standing or transfer this session    Balance Overall balance assessment: Needs assistance Sitting-balance support: Feet unsupported;Single extremity supported Sitting balance-Leahy Scale: Poor Sitting balance - Comments: initially needing max A and progressing to min - mod briefly. Fatigues quickly                                   ADL either performed or assessed with clinical judgement   ADL Overall ADL's : Needs assistance/impaired                                       General ADL Comments: Pt needed max A for bed mobility to change soiled pad from bed level. Total A for supine <>sit. Pt refusal of standing and transfers this session. Pt has had max- total A for self care from bed level PTA from  son.     Vision Patient Visual Report: No change from baseline              Pertinent Vitals/Pain Pain Assessment: Faces Faces Pain Scale: Hurts even more Pain Location: R LE Pain Descriptors / Indicators: Aching;Discomfort;Grimacing Pain Intervention(s): Limited activity within patient's tolerance;Monitored during session;Repositioned     Hand Dominance Left   Extremity/Trunk Assessment Upper Extremity Assessment Upper Extremity Assessment: Generalized weakness   Lower Extremity Assessment Lower Extremity Assessment: RLE deficits/detail RLE Deficits / Details: Pt with PWB status. Limited by pain in foot and knee.       Communication Communication Communication: No difficulties   Cognition  Arousal/Alertness: Awake/alert Behavior During Therapy: WFL for tasks assessed/performed Overall Cognitive Status: Within Functional Limits for tasks assessed                                                Home Living Family/patient expects to be discharged to:: Private residence Living Arrangements: Children Available Help at Discharge: Family;Available 24 hours/day Type of Home: House Home Access: Stairs to enter [employer] of Steps: 4 Entrance Stairs-Rails: Left Home Layout: One level     Bathroom Shower/Tub: Tub only         Home Equipment: Cane - single point;Shower seat;Wheelchair - [employer] - 2 wheels          Prior Functioning/Environment Level of Independence: Needs assistance  Gait / Transfers Assistance Needed: Pt endorses transfers from bed <>wheelchair before fall 4 months ago. Prior admission 6 months ago also reports no ambulation at home. ADL's / Homemaking Assistance Needed: Pt reports she was getting HH therapy and wound care RN but that has stopped because of insurance. Pt has been bed bound for several months with son providing all care.            OT Problem List: Decreased strength;Impaired balance (sitting and/or standing);Decreased activity tolerance;Decreased coordination;Pain;Decreased knowledge of precautions;Decreased safety awareness      OT Treatment/Interventions: Self-care/ADL training;Manual therapy;Therapeutic exercise;Patient/family education;Neuromuscular education;Energy conservation;Therapeutic activities;DME and/or AE instruction;Balance training    OT Goals(Current goals can be found in the care plan section) Acute Rehab OT Goals Patient Stated Goal: to walk again OT Goal Formulation: With patient Time For Goal Achievement: 06/16/20 Potential to Achieve Goals: Poor ADL Goals Pt Will Perform Grooming: with supervision;sitting;with set-up Pt Will Perform Upper Body Bathing: with supervision;with  set-up;sitting Pt Will Transfer to Toilet: with max assist;stand pivot transfer;bedside commode Pt/caregiver will Perform Home Exercise Program: With written HEP provided;Both right and left upper extremity;With Supervision;With theraband;Increased strength  OT Frequency: Min 1X/week   Barriers to D/C:    none known at this time          AM-PAC OT "6 Clicks" Daily Activity     Outcome Measure Help from another person eating meals?: None Help from another person taking care of personal grooming?: None Help from another person toileting, which includes using toliet, bedpan, or urinal?: Total Help from another person bathing (including washing, rinsing, drying)?: Total Help from another person to put on and taking off regular upper body clothing?: A Lot Help from another person to put on and taking off regular lower body clothing?: Total 6 Click Score: 13   End of Session Nurse Communication: Mobility status  Activity Tolerance: Patient limited by fatigue Patient left: in bed;with call bell/phone within reach;with  bed alarm set  OT Visit Diagnosis: Unsteadiness on feet (R26.81);Repeated falls (R29.6);Muscle weakness (generalized) (M62.81)                Time: 9611-6435 OT Time Calculation (min): 37 min Charges:  OT General Charges $OT Visit: 1 Visit OT Evaluation $OT Eval Moderate Complexity: 1 Mod OT Treatments $Self Care/Home Management : 8-22 mins $Therapeutic Activity: 8-22 mins  Darleen Crocker, MS, OTR/L , CBIS ascom [phone]  06/02/20, 10:48 AM

## 2020-06-02 NOTE — Progress Notes (Signed)
PT Cancellation Note  Patient Details Name: [name] [name] MRN: [id number] DOB: 1945-07-19   Cancelled Treatment:    Reason Eval/Treat Not Completed: Patient declined, no reason specified. States she already did that ( with OT). Reports she is tired. Will re-attempt eval tomorrow.     Evelynn Hench 06/02/2020, 12:15 PM

## 2020-06-02 NOTE — TOC Progression Note (Addendum)
Transition of Care [facility]) - Progression Note    Patient Details  Name: [name] [name] MRN: [id number] Date of Birth: 16-Apr-1945  Transition of Care [facility]) CM/SW [place], LCSW Phone Number: 06/02/2020, 1:52 PM  Clinical Narrative:   Patient and son agreeable to DME recommendation for a hospital bed. Ordered through [employer]. Will be delivered either today or tomorrow. Son prefers to wait until it has been delivered before taking her home. MD aware. [place] is aware of plan for discharge home.  3:21 pm: Nurse will set up EMS transport if hospital bed is delivered after 4:30.   Expected Discharge Plan and Services           Expected Discharge Date: 06/02/20                                     Social Determinants of Health (SDOH) Interventions    Readmission Risk Interventions Readmission Risk Prevention Plan 06/01/2020 05/18/2020 11/19/2019  Transportation Screening Complete Complete Complete  PCP or Specialist Appt within 3-5 Days - Complete Complete  HRI or [place] - Complete Complete  Social Work Consult for [place] Planning/Counseling - Complete Complete  Palliative Care Screening - Not Applicable Complete  Medication Review Press photographer) Complete Complete Complete  PCP or Specialist appointment within 3-5 days of discharge Complete - -  SW Recovery Care/Counseling Consult Complete - -  Palliative Care Screening Not Applicable - -  [place] Not Applicable - -  Some recent data might be hidden

## 2020-06-02 NOTE — Care Management Important Message (Signed)
Important Message  Patient Details  Name: [name] [name] MRN: [id number] Date of Birth: 06-06-1945   Medicare Important Message Given:  Yes     Juliann Pulse A Abhishek Levesque 06/02/2020, 1:52 PM

## 2020-06-03 LAB — GLUCOSE, CAPILLARY
Glucose-Capillary: 79 mg/dL (ref 70–99)
Glucose-Capillary: 122 mg/dL — ABNORMAL HIGH (ref 70–99)
Glucose-Capillary: 149 mg/dL — ABNORMAL HIGH (ref 70–99)
Glucose-Capillary: 162 mg/dL — ABNORMAL HIGH (ref 70–99)
Glucose-Capillary: 116 mg/dL — ABNORMAL HIGH (ref 70–99)

## 2020-06-03 LAB — BASIC METABOLIC PANEL
Anion gap: 7 (ref 5–15)
BUN: 16 mg/dL (ref 8–23)
CO2: 22 mmol/L (ref 22–32)
Calcium: 8.3 mg/dL — ABNORMAL LOW (ref 8.9–10.3)
Chloride: 111 mmol/L (ref 98–111)
Creatinine, Ser: 2.02 mg/dL — ABNORMAL HIGH (ref 0.44–1.00)
GFR, Estimated: 26 mL/min — ABNORMAL LOW (ref >60)
Glucose, Bld: 86 mg/dL (ref 70–99)
Potassium: 4.4 mmol/L (ref 3.5–5.1)
Sodium: 140 mmol/L (ref 135–145)

## 2020-06-03 NOTE — TOC Progression Note (Signed)
Transition of Care [facility]) - Progression Note    Patient Details  Name: [name] [name] MRN: [id number] Date of Birth: 11-Aug-1945  Transition of Care [facility]) CM/SW Contact  Boris Sharper, LCSW Phone Number: 06/03/2020, 5:33 PM  Clinical Narrative:    CSW called [facility] for an ETA on hospital bed and the referral stated the bed was not going to be delivered until Monday. CSW asked to have the bed delivered today and ws notified that the bed can be delivered today.   CSW reached out again to check on bed delivery and bed has still not been delivered at this time. Family has requested that the bed be delivered before the pt be discharged, Plan for discharge tomorrow.          Expected Discharge Plan and Services           Expected Discharge Date: 06/02/20                                     Social Determinants of Health (SDOH) Interventions    Readmission Risk Interventions Readmission Risk Prevention Plan 06/01/2020 05/18/2020 11/19/2019  Transportation Screening Complete Complete Complete  PCP or Specialist Appt within 3-5 Days - Complete Complete  HRI or [place] - Complete Complete  Social Work Consult for [place] Planning/Counseling - Complete Complete  Palliative Care Screening - Not Applicable Complete  Medication Review Press photographer) Complete Complete Complete  PCP or Specialist appointment within 3-5 days of discharge Complete - -  SW Recovery Care/Counseling Consult Complete - -  Palliative Care Screening Not Applicable - -  [place] Not Applicable - -  Some recent data might be hidden

## 2020-06-03 NOTE — Progress Notes (Signed)
Brief progress note.  Discharge orders completed yesterday.  Discharge delayed due to DME hospital bed not being delivered to patient's house.  Should be delivered today.  Per RN patient son will make [place] aware once hospital bed is delivered.  Patient remains medically stable for discharge.  Ralene Muskrat MD  No charge

## 2020-06-04 ENCOUNTER — Inpatient Hospital Stay: Payer: Medicare Other

## 2020-06-04 ENCOUNTER — Encounter: Payer: Self-pay | Admitting: Internal Medicine

## 2020-06-04 LAB — GLUCOSE, CAPILLARY
Glucose-Capillary: 95 mg/dL (ref 70–99)
Glucose-Capillary: 131 mg/dL — ABNORMAL HIGH (ref 70–99)
Glucose-Capillary: 128 mg/dL — ABNORMAL HIGH (ref 70–99)
Glucose-Capillary: 116 mg/dL — ABNORMAL HIGH (ref 70–99)

## 2020-06-04 NOTE — Plan of Care (Signed)
Pt awaiting HH supply at her house, discharge was delayed because of the above reasons. Pt had uneventful night.

## 2020-06-04 NOTE — Progress Notes (Signed)
Brief progress note.  Discharge orders have been in place since Friday 2/25.  I have been in close contact with TOC.  This morning we are being told that patient's hospital bed has not yet been delivered and likely will not be delivered until Monday 2/28.  Unfortunately this precludes a safe disposition.  Discharge delayed as documented by TOC.  Patient remains medically stable for discharge.  No acute hospital issues.  Anticipate discharge home with home health services once DME is delivered on Monday 2/28  Ralene Muskrat MD  No charge

## 2020-06-04 NOTE — TOC Progression Note (Signed)
Transition of Care [facility]) - Progression Note    Patient Details  Name: [name] [name] MRN: [id number] Date of Birth: 10-Sep-1945  Transition of Care [facility]) CM/SW Contact  Boris Sharper, LCSW Phone Number: 06/04/2020, 11:25 AM  Clinical Narrative:    CSW contacted Jodell Cipro with [facility] regarding pt's bed delivery CSW was now told that bed is not scheduled to be delivered until Monday. CSW notified MD.         Expected Discharge Plan and Services           Expected Discharge Date: 06/02/20                                     Social Determinants of Health (SDOH) Interventions    Readmission Risk Interventions Readmission Risk Prevention Plan 06/01/2020 05/18/2020 11/19/2019  Transportation Screening Complete Complete Complete  PCP or Specialist Appt within 3-5 Days - Complete Complete  HRI or Home Care Consult - Complete Complete  Social Work Consult for [place] Planning/Counseling - Complete Complete  Palliative Care Screening - Not Applicable Complete  Medication Review Press photographer) Complete Complete Complete  PCP or Specialist appointment within 3-5 days of discharge Complete - -  SW Recovery Care/Counseling Consult Complete - -  Palliative Care Screening Not Applicable - -  [place] Not Applicable - -  Some recent data might be hidden

## 2020-06-04 NOTE — Anesthesia Postprocedure Evaluation (Signed)
Anesthesia Post Note  Patient: [name] [name]  Procedure(s) Performed: AMPUTATION TOE (Right Toe)  Patient location during evaluation: PACU Anesthesia Type: General Level of consciousness: awake and alert and oriented Pain management: pain level controlled Vital Signs Assessment: post-procedure vital signs reviewed and stable Respiratory status: spontaneous breathing Cardiovascular status: blood pressure returned to baseline Anesthetic complications: no   No complications documented.   Last Vitals:  Vitals:   06/04/20 0855 06/04/20 1119  BP: (!) 133/59 127/80  Pulse: (!) 50 61  Resp: 15 16  Temp: 37.2 C 37 C  SpO2: 95% 95%    Last Pain:  Vitals:   06/04/20 1024  TempSrc:   PainSc: 0-No pain                 CARROLL,PAUL

## 2020-06-05 LAB — ANAEROBIC CULTURE W GRAM STAIN: Gram Stain: NONE SEEN

## 2020-06-05 LAB — GLUCOSE, CAPILLARY
Glucose-Capillary: 89 mg/dL (ref 70–99)
Glucose-Capillary: 133 mg/dL — ABNORMAL HIGH (ref 70–99)

## 2020-06-05 MED ORDER — DOXYCYCLINE HYCLATE 100 MG PO TABS: 100.0000 mg | ORAL_TABLET | Freq: Two times a day (BID) | ORAL | Status: DC

## 2020-06-05 MED ORDER — DOXYCYCLINE HYCLATE 100 MG PO TABS: 100.0000 mg | ORAL_TABLET | Freq: Two times a day (BID) | ORAL | 0 refills | Status: AC

## 2020-06-05 NOTE — Evaluation (Signed)
Physical Therapy Evaluation Patient Details Name: [name] [name] MRN: [id number] DOB: [birth year] Today's Date: 06/05/2020   History of Present Illness  Pt is a 75 y.o. female with medical history significant of DM, HTN, HLD, CKD-IV, hypothyroidism, gout, who presents with foot ulcer with infection in right second toe.  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    Clinical Impression  Pt reported 8/10 pain to her L lower leg but improved after having received pain medication recently.  Pt reported no pain to her operative R foot.  Pt was highly self-limiting during the session secondary to pain and/or concern that movement of any kind would increase pain to her LEs.  Max encouragement given along with education on physiological benefits of activity but pt declined all attempts to attempt to get sitting up to EOB. Pt did agree, again with max encouragement, to participate with supine therex to her tolerance and put forth fair effort with below exercises. Pt/son reported that pt is non-ambulatory at baseline but that pt is able to assist with SPT's with son's help.  Pt will benefit from a trial of HHPT to assess for pt/family safety with transfers and to assess for further equipment needs at home.    Follow Up Recommendations Home health PT;Supervision/Assistance - 24 hour    Equipment Recommendations  None recommended by PT    Recommendations for Other Services       Precautions / Restrictions Precautions Precautions: Fall Restrictions Weight Bearing Restrictions: Yes RLE Weight Bearing: Partial weight bearing RLE Partial Weight Bearing Percentage or Pounds: PWB through the heel only with post op shoe donned      Mobility  Bed Mobility               General bed mobility comments: Pt refused to attempt bed mobility x 2 despite max encouragment    Transfers                 General transfer comment: refused to  attempt  Ambulation/Gait                Stairs            Wheelchair Mobility    Modified Rankin (Stroke Patients Only)       Balance                                             Pertinent Vitals/Pain Pain Assessment: 0-10 Pain Score: 8  Pain Location: Left lower leg, no pain in surgical R foot, nursing aware Pain Descriptors / Indicators: Aching;Sore Pain Intervention(s): Premedicated before session;Monitored during session;Repositioned    Home Living Family/patient expects to be discharged to:: Private residence Living Arrangements: Children Available Help at Discharge: Family;Available 24 hours/day Type of Home: House Home Access: Stairs to enter Entrance Stairs-Rails: Left Entrance Stairs-Number of Steps: 4 Home Layout: One level Home Equipment: Cane - single point;Shower seat;Wheelchair - [employer] - 2 wheels      Prior Function Level of Independence: Needs assistance   Gait / Transfers Assistance Needed: Pt endorses transfers from bed <>wheelchair before fall 4 months ago. Prior admission 6 months ago also reports no ambulation at home. Per pt and son pt requires assist with SPT's but does participate  ADL's / Homemaking Assistance Needed: Total assist from son with ADLs  Hand Dominance   Dominant Hand: Left    Extremity/Trunk Assessment   Upper Extremity Assessment Upper Extremity Assessment: Generalized weakness    Lower Extremity Assessment Lower Extremity Assessment: Generalized weakness;LLE deficits/detail RLE Deficits / Details: Pt highly self-limiting secondary to pain, unable to assess LE strength but grossly weak based on participation with limited therex in supine RLE: Unable to fully assess due to pain LLE Deficits / Details: Pt highly self-limiting secondary to pain, unable to assess LE strength but grossly weak based on participation with limited therex in supine LLE: Unable to fully assess due to  pain       Communication   Communication: No difficulties  Cognition Arousal/Alertness: Awake/alert Behavior During Therapy: WFL for tasks assessed/performed Overall Cognitive Status: Within Functional Limits for tasks assessed                                        General Comments      Exercises Total Joint Exercises Quad Sets: AROM;Strengthening;Both;5 reps;10 reps Gluteal Sets: Strengthening;Both;10 reps Hip ABduction/ADduction: AAROM;Both;10 reps Straight Leg Raises: AAROM;Both;10 reps Other Exercises Other Exercises: Pt education provided on physiological benefits of activity   Assessment/Plan    PT Assessment Patient needs continued PT services  PT Problem List Decreased strength;Decreased activity tolerance;Decreased balance;Decreased mobility;Decreased knowledge of use of DME;Pain       PT Treatment Interventions DME instruction;Functional mobility training;Therapeutic activities;Therapeutic exercise;Balance training;Patient/family education    PT Goals (Current goals can be found in the Care Plan section)  Acute Rehab PT Goals Patient Stated Goal: To walk again PT Goal Formulation: With patient Time For Goal Achievement: 06/18/20 Potential to Achieve Goals: Good    Frequency Min 2X/week   Barriers to discharge        Co-evaluation               AM-PAC PT "6 Clicks" Mobility  Outcome Measure Help needed turning from your back to your side while in a flat bed without using bedrails?: A Lot Help needed moving from lying on your back to sitting on the side of a flat bed without using bedrails?: A Lot Help needed moving to and from a bed to a chair (including a wheelchair)?: A Lot Help needed standing up from a chair using your arms (e.g., wheelchair or bedside chair)?: A Lot Help needed to walk in hospital room?: Total Help needed climbing 3-5 steps with a railing? : Total 6 Click Score: 10    End of Session   Activity Tolerance:  Patient limited by pain Patient left: in bed;with call bell/phone within reach;with bed alarm set;with family/visitor present Nurse Communication: Mobility status;Weight bearing status PT Visit Diagnosis: Difficulty in walking, not elsewhere classified (R26.2);Muscle weakness (generalized) (M62.81);Pain Pain - Right/Left: Left Pain - part of body: Leg    Time: 5643-3295 PT Time Calculation (min) (ACUTE ONLY): 21 min   Charges:   PT Evaluation $PT Eval Low Complexity: 1 Low          D. Royetta Asal PT, DPT 06/05/20, 10:37 AM

## 2020-06-05 NOTE — Care Management Important Message (Signed)
Important Message  Patient Details  Name: [name] [name] MRN: [id number] Date of Birth: 1946/02/01   Medicare Important Message Given:  Yes     Juliann Pulse A Lurline Caver 06/05/2020, 10:00 AM

## 2020-06-05 NOTE — Progress Notes (Signed)
PROGRESS NOTE    YAZLIN EKBLAD  JJH:[id number] DOB: 03/15/1946 DOA: 05/17/2020 PCP: Marguerita Merles, MD  Brief Narrative:Ebone F Brownis a 75 y.o.femalewith medical history significant ofDM, HTN, HLD, CKD-IV, hypothyroidism, gout,whopresents with foot ulcer with infection in right second toe.  Pt states that she noted a ulcer in dorsalaspects ofrightsecondarytoe about1 week ago, which has been progressively worsening.Shenotedlittle pus drainage.  2/16-creatinine down to 3.17 2/17 creatinine improving, today 2.43 2/18-creatinine 2.03 2/19-buttocks less sore with cream use. Creatinine down to 1.79 2/20-stool less watery with addition of probiotics.  At 1.76 today 2/21-renal function stabilizing. No labs today 2/22- angiogram by vascular today 2/23: N.p.o. for operative intervention with podiatry today 2/24: Status post toe amputation.  Wound looks clean.  No dehiscence.  Surgical margins clear. 2/28: Patient has been waiting in the hospital all weekend for hospital bed DME to be delivered to her house.  This was supposed to have been done Friday 2/25.  Unfortunately was not done that day and no bed was delivered over the weekend.  Case management aware and will follow up with DME company and patient's family to see when bed will be delivered.  Patient is endorsing some pain in the left foot.  Left boot dressed at bedside with improvement    Assessment & Plan:   Principal Problem:   Diabetic foot infection_right second toe  Active Problems:   HTN (hypertension)   Hypothyroidism (acquired)   Hyperlipidemia   CKD (chronic kidney disease), stage IV (HCC)   Type II diabetes mellitus with renal manifestations (HCC)   GERD (gastroesophageal reflux disease)  Diabetic foot ulcer with concomitant infection Vascular and podiatry on board ABI nondiagnostic Initial revascularization attempts unsuccessful on 2/11 Subsequent attempts were canceled due to worsening kidney  function Successful angiogram on 2/22.  Successful recanalization of right lower extremity Status post toe amputation 2/23 Surgical margins reviewed and clear Wound looks clean, no dehiscence Plan: Complete 7 days of Augmentin Last dose will be 3/2/222 As needed pain control  HTN (hypertension) Stable  Continue amlodipine and Coreg  IV hydralazine as needed   Anemia Likely multifactorial Suspect anemia of chronic disease versus dilutional effect No indication of blood loss No indication for current transfusion   Hypothyroidism (acquired) Continue Synthroid  Hyperlipidemia Continue Lipitor  Acute onCKD (chronic kidney disease), stage IV (HCC):suspected contrast induced nephropathy (IV dye used on 2/11) Baseline creatinine 1.6-1.9 recently. -Her creatinine is 1.96 --angiogram---1.98--3.17>>>2.42>>>1.79>>>1.83 Improved with ivf. Nephrology following Monitor levels closely after contrast exposure Creatinine stable at time of discharge    Type II diabetes mellitus with renal manifestations (HCC)  Recent A1c 7.0,  Blood glucose levels controlled R-ISS Hold Lantus due to low sugars   GERD (gastroesophageal reflux disease) Continue Protonix      DVT prophylaxis: SQ heparin Code Status: Full Family Communication: None today  Disposition Plan: Status is: Inpatient  Remains inpatient appropriate because:Unsafe d/c plan   Dispo: The patient is from: Home              Anticipated d/c is to: Home              Patient currently is not medically stable to d/c.   Difficult to place patient No   Patient is medically stable for discharge.  Disposition delayed due to hospital bed not being delivered to house        Level of care: Med-Surg  Consultants:   Podiatry  Vascular surgery  Procedures:   Right lower extremity angiogram,  2/22  Right second toe amputation, 2/23  Antimicrobials:   Unasyn    Subjective: Patient seen and  examined.  Endorses pain in left foot.  No other issues.  Objective: Vitals:   06/05/20 0031 06/05/20 0600 06/05/20 0731 06/05/20 1139  BP: (!) 97/55 (!) 115/45 (!) 123/57 (!) 120/55  Pulse: (!) 58 (!) 59 (!) 53 (!) 57  Resp: 18 17 17 17   Temp: 98.2 F (36.8 C) 98.2 F (36.8 C) 99.1 F (37.3 C) 99 F (37.2 C)  TempSrc:   Oral Oral  SpO2: 90% 99% 93% 92%  Weight:      Height:        Intake/Output Summary (Last 24 hours) at 06/05/2020 1203 Last data filed at 06/05/2020 1041 Gross per 24 hour  Intake 120 ml  Output 400 ml  Net -280 ml   Filed Weights   05/17/20 1201 05/30/20 1057 05/31/20 1616  Weight: 90 kg 97.5 kg 97.5 kg    Examination:  General exam: Appears calm and comfortable  Respiratory system: Clear to auscultation. Respiratory effort normal. Cardiovascular system: S1 & S2 heard, RRR. No JVD, murmurs, rubs, gallops or clicks. No pedal edema. Gastrointestinal system: Abdomen is nondistended, soft and nontender. No organomegaly or masses felt. Normal bowel sounds heard. Central nervous system: Alert and oriented. No focal neurological deficits. Extremities: Bilateral lower extremities in boots and surgical dressings, not removed Skin: Feet in surgical dressings, not removed Psychiatry: Judgement and insight appear normal. Mood & affect appropriate.     Data Reviewed: I have personally reviewed following labs and imaging studies  CBC: Recent Labs  Lab 05/30/20 0520 05/31/20 0523 06/01/20 0824  WBC 7.1 8.0 8.2  NEUTROABS  --   --  5.0  HGB 7.3* 7.5* 7.9*  HCT 21.8* 23.8* 23.8*  MCV 92.8 95.6 93.3  PLT 201 205 431   Basic Metabolic Panel: Recent Labs  Lab 05/30/20 0520 05/31/20 0523 06/01/20 0824 06/03/20 0541  NA 139 138 137 140  K 4.0 4.3 4.1 4.4  CL 110 108 108 111  CO2 23 22 20* 22  GLUCOSE 101* 108* 95 86  BUN 16 16 14 16   CREATININE 1.83* 1.88* 1.94* 2.02*  CALCIUM 8.2* 8.2* 8.3* 8.3*  MG 1.9  --   --   --    GFR: Estimated Creatinine  Clearance: 29.8 mL/min (A) (by C-G formula based on SCr of 2.02 mg/dL (H)). Liver Function Tests: No results for input(s): AST, ALT, ALKPHOS, BILITOT, PROT, ALBUMIN in the last 168 hours. No results for input(s): LIPASE, AMYLASE in the last 168 hours. No results for input(s): AMMONIA in the last 168 hours. Coagulation Profile: No results for input(s): INR, PROTIME in the last 168 hours. Cardiac Enzymes: No results for input(s): CKTOTAL, CKMB, CKMBINDEX, TROPONINI in the last 168 hours. BNP (last 3 results) No results for input(s): PROBNP in the last 8760 hours. HbA1C: No results for input(s): HGBA1C in the last 72 hours. CBG: Recent Labs  Lab 06/04/20 1149 06/04/20 1618 06/04/20 2110 06/05/20 0732 06/05/20 1140  GLUCAP 131* 128* 116* 89 133*   Lipid Profile: No results for input(s): CHOL, HDL, LDLCALC, TRIG, CHOLHDL, LDLDIRECT in the last 72 hours. Thyroid Function Tests: No results for input(s): TSH, T4TOTAL, FREET4, T3FREE, THYROIDAB in the last 72 hours. Anemia Panel: No results for input(s): VITAMINB12, FOLATE, FERRITIN, TIBC, IRON, RETICCTPCT in the last 72 hours. Sepsis Labs: No results for input(s): PROCALCITON, LATICACIDVEN in the last 168 hours.  Recent Results (from the  past 240 hour(s))  MRSA PCR Screening     Status: Abnormal   Collection Time: 05/30/20 11:34 PM   Specimen: Nasal Mucosa; Nasopharyngeal  Result Value Ref Range Status   MRSA by PCR POSITIVE (A) NEGATIVE Final    Comment:        The GeneXpert MRSA Assay (FDA approved for NASAL specimens only), is one component of a comprehensive MRSA colonization surveillance program. It is not intended to diagnose MRSA infection nor to guide or monitor treatment for MRSA infections. RESULT CALLED TO, READ BACK BY AND VERIFIED WITH: Georgeanna Lea AMMONS RN [phone] 05/31/20 HNM Performed at [facility], [address] [place], [place] [address]   Anaerobic culture w Gram Stain     Status: None  (Preliminary result)   Collection Time: 05/31/20  4:18 PM   Specimen: Toe, Right; Amputation  Result Value Ref Range Status   Specimen Description   Final    TOE Performed at [facility], [address], [place], [place] [address]    Special Requests   Final    NONE Performed at [facility], [address] [place], [place] [address]    Gram Stain NO WBC SEEN NO ORGANISMS SEEN   Final   Culture   Final    RARE STAPHYLOCOCCUS AUREUS SUSCEPTIBILITIES TO FOLLOW Performed at [facility], [place] [address], [place], [place] [address]    Report Status PENDING  Incomplete         Radiology Studies: DG Foot 2 Views Right  Result Date: 06/04/2020 CLINICAL DATA:  Postop check right foot EXAM: RIGHT FOOT - 2 VIEW COMPARISON:  05/17/2020 FINDINGS: The patient is status post amputation of the second digit at the MTP joint. No osseous fracture is seen. Overlying soft tissue swelling seen at the wound. Again noted is fragmentation and advanced arthropathy seen within the midfoot and hindfoot with hindfoot collapse. 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 MTP joint arthropathy with ankylosis is noted. There is dorsal soft tissue swelling. 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 No definite acute complication. 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 Electronically Signed   By: Prudencio Pair M.D.   On: 06/04/2020 18:59        Scheduled Meds: . acidophilus  1 capsule Oral Daily  . amLODipine  10 mg Oral Daily  . amoxicillin-clavulanate  1 tablet Oral Q12H  . aspirin  81 mg Oral Daily  . atorvastatin  40 mg Oral Daily  . carvedilol  25 mg Oral BID  . donepezil  10 mg Oral QHS  . gabapentin  200 mg Oral BID  . heparin  5,000 Units Subcutaneous Q8H  . insulin aspart  0-5 Units Subcutaneous QHS  . insulin aspart  0-9 Units Subcutaneous TID WC  . levothyroxine  50 mcg  Oral Q0600  . sodium chloride flush  3 mL Intravenous Q12H   Continuous Infusions: . sodium chloride 10 mL/hr at 05/22/20 0425  . sodium chloride       LOS: 19 days    Time spent: 15 minutes    Sidney Ace, MD [facility] Pager [phone]  If 7PM-7AM, please contact night-coverage 06/05/2020, 12:03 PM

## 2020-06-06 LAB — SURGICAL PATHOLOGY

## 2020-06-08 ENCOUNTER — Ambulatory Visit: Payer: Medicare Other | Admitting: Gastroenterology

## 2020-06-20 ENCOUNTER — Ambulatory Visit: Payer: Medicare Other | Admitting: Infectious Diseases

## 2020-06-30 ENCOUNTER — Other Ambulatory Visit (INDEPENDENT_AMBULATORY_CARE_PROVIDER_SITE_OTHER): Payer: Self-pay | Admitting: Vascular Surgery

## 2020-06-30 DIAGNOSIS — I70219 Atherosclerosis of native arteries of extremities with intermittent claudication, unspecified extremity: Secondary | ICD-10-CM | POA: Insufficient documentation

## 2020-06-30 DIAGNOSIS — I70261 Atherosclerosis of native arteries of extremities with gangrene, right leg: Secondary | ICD-10-CM

## 2020-06-30 DIAGNOSIS — Z9862 Peripheral vascular angioplasty status: Secondary | ICD-10-CM

## 2020-06-30 NOTE — Progress Notes (Deleted)
MRN : [id number]  [name] [name] is a 75 y.o. (02/23/1946) female who presents with chief complaint of No chief complaint on file. Marland Kitchen  History of Present Illness:   The patient returns to the office for followup and review status post angiogram with intervention 05/30/2020.   Procedure:  Percutaneous transluminal angioplasty right anterior tibial artery to 3 mm  The patient notes improvement in the lower extremity symptoms. No interval shortening of the patient's claudication distance or rest pain symptoms. Previous wounds have now healed.  No new ulcers or wounds have occurred since the last visit.  There have been no significant changes to the patient's overall health care.  The patient denies amaurosis fugax or recent TIA symptoms. There are no recent neurological changes noted. The patient denies history of DVT, PE or superficial thrombophlebitis. The patient denies recent episodes of angina or shortness of breath.   ABI's Rt=*** and Lt=***  (previous ABI's Rt=*** and Lt=***) Duplex US of the *** lower extremity arterial system shows ***  No outpatient medications have been marked as taking for the 07/03/20 encounter (Appointment) with Delana Meyer, Dolores Lory, MD.    Past Medical History:  Diagnosis Date  . Diabetes mellitus without complication ([place])   . Humerus fracture 04/20/2018   left  . Hyperlipidemia   . Hypertension   . Hypothyroidism   . Vertigo    several yrs ago  . Wears dentures    partial upper and lower (loose)    Past Surgical History:  Procedure Laterality Date  . AMPUTATION TOE Right 05/31/2020   Procedure: AMPUTATION TOE;  Surgeon: Caroline More, DPM;  Location: [facility];  Service: Podiatry;  Laterality: Right;  . CATARACT EXTRACTION W/PHACO Left 12/01/2018   Procedure: CATARACT EXTRACTION PHACO AND INTRAOCULAR LENS PLACEMENT ([place]) LEFT DIABETIC;  Surgeon: Birder Robson, MD;  Location: [place];  Service: Ophthalmology;  Laterality: Left;   Diabetic - insulin  . COLONOSCOPY WITH PROPOFOL N/A 07/07/2019   Procedure: COLONOSCOPY WITH PROPOFOL;  Surgeon: Robert Bellow, MD;  Location: [facility];  Service: Endoscopy;  Laterality: N/A;  . ESOPHAGOGASTRODUODENOSCOPY (EGD) WITH PROPOFOL N/A 07/07/2019   Procedure: ESOPHAGOGASTRODUODENOSCOPY (EGD) WITH PROPOFOL;  Surgeon: Robert Bellow, MD;  Location: [facility];  Service: Endoscopy;  Laterality: N/A;  . FOOT SURGERY Right    lesion excision  . LOWER EXTREMITY ANGIOGRAPHY Right 05/19/2020   Procedure: Lower Extremity Angiography;  Surgeon: Katha Cabal, MD;  Location: [place] [facility];  Service: Cardiovascular;  Laterality: Right;  . LOWER EXTREMITY ANGIOGRAPHY Right 05/30/2020   Procedure: Lower Extremity Angiography;  Surgeon: Katha Cabal, MD;  Location: [place] [facility];  Service: Cardiovascular;  Laterality: Right;  Pedal Approach    Social History Social History   Tobacco Use  . Smoking status: Never Smoker  . Smokeless tobacco: Never Used  Vaping Use  . Vaping Use: Never used  Substance Use Topics  . Alcohol use: No  . Drug use: Never    Family History Family History  Problem Relation Age of Onset  . Breast cancer Neg Hx     No Known Allergies   REVIEW OF SYSTEMS (Negative unless checked)  Constitutional: [] Weight loss  [] Fever  [] Chills Cardiac: [] Chest pain   [] Chest pressure   [] Palpitations   [] Shortness of breath when laying flat   [] Shortness of breath with exertion. Vascular:  [] Pain in legs with walking   [] Pain in legs at rest  [] History of DVT   [] Phlebitis   []   Swelling in legs   [] Varicose veins   [] Non-healing ulcers Pulmonary:   [] Uses home oxygen   [] Productive cough   [] Hemoptysis   [] Wheeze  [] COPD   [] Asthma Neurologic:  [] Dizziness   [] Seizures   [] History of stroke   [] History of TIA  [] Aphasia   [] Vissual changes   [] Weakness or numbness in arm   [] Weakness or numbness in leg Musculoskeletal:   [] Joint  swelling   [] Joint pain   [] Low back pain Hematologic:  [] Easy bruising  [] Easy bleeding   [] Hypercoagulable state   [] Anemic Gastrointestinal:  [] Diarrhea   [] Vomiting  [] Gastroesophageal reflux/heartburn   [] Difficulty swallowing. Genitourinary:  [] Chronic kidney disease   [] Difficult urination  [] Frequent urination   [] Blood in urine Skin:  [] Rashes   [] Ulcers  Psychological:  [] History of anxiety   []  History of major depression.  Physical Examination  There were no vitals filed for this visit. There is no height or weight on file to calculate BMI. Gen: WD/WN, NAD Head: [place]/AT, No temporalis wasting.  Ear/Nose/Throat: Hearing grossly intact, nares w/o erythema or drainage Eyes: PER, EOMI, sclera nonicteric.  Neck: Supple, no large masses.   Pulmonary:  Good air movement, no audible wheezing bilaterally, no use of accessory muscles.  Cardiac: RRR, no JVD Vascular:  Vessel Right Left  Radial Palpable Palpable  PT Palpable Palpable  DP Palpable Palpable  Gastrointestinal: Non-distended. No guarding/no peritoneal signs.  Musculoskeletal: M/S 5/5 throughout.  No deformity or atrophy.  Neurologic: CN 2-12 intact. Symmetrical.  Speech is fluent. Motor exam as listed above. Psychiatric: Judgment intact, Mood & affect appropriate for pt's clinical situation. Dermatologic: No rashes or ulcers noted.  No changes consistent with cellulitis. Lymph : No lichenification or skin changes of chronic lymphedema.  CBC Lab Results  Component Value Date   WBC 8.2 06/01/2020   HGB 7.9 (L) 06/01/2020   HCT 23.8 (L) 06/01/2020   MCV 93.3 06/01/2020   PLT 212 06/01/2020    BMET    Component Value Date/Time   NA 140 06/03/2020 0541   NA 142 04/17/2014 1131   K 4.4 06/03/2020 0541   K 4.0 04/17/2014 1131   CL 111 06/03/2020 0541   CL 109 (H) 04/17/2014 1131   CO2 22 06/03/2020 0541   CO2 24 04/17/2014 1131   GLUCOSE 86 06/03/2020 0541   GLUCOSE 89 04/17/2014 1131   BUN 16 06/03/2020 0541    BUN 22 (H) 04/17/2014 1131   CREATININE 2.02 (H) 06/03/2020 0541   CREATININE 1.78 (H) 04/17/2014 1131   CALCIUM 8.3 (L) 06/03/2020 0541   CALCIUM 8.8 04/17/2014 1131   GFRNONAA 26 (L) 06/03/2020 0541   GFRNONAA 30 (L) 04/17/2014 1131   GFRNONAA 37 (L) 04/22/2013 0946   GFRAA 29 (L) 12/07/2019 0645   GFRAA 37 (L) 04/17/2014 1131   GFRAA 42 (L) 04/22/2013 0946   CrCl cannot be calculated (Patient's most recent lab result is older than the maximum 21 days allowed.).  COAG Lab Results  Component Value Date   INR 1.2 10/11/2019   INR 1.2 10/10/2019    Radiology DG Foot 2 Views Right  Result Date: 06/04/2020 CLINICAL DATA:  Postop check right foot EXAM: RIGHT FOOT - 2 VIEW COMPARISON:  05/17/2020 FINDINGS: The patient is status post amputation of the second digit at the MTP joint. No osseous fracture is seen. Overlying soft tissue swelling seen at the wound. Again noted is fragmentation and advanced arthropathy seen within the midfoot and hindfoot with hindfoot collapse.  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 MTP joint arthropathy with ankylosis is noted. There is dorsal soft tissue swelling. 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 No definite acute complication. 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 Electronically Signed   By: Prudencio Pair M.D.   On: 06/04/2020 18:59     Assessment/Plan There are no diagnoses linked to this encounter.   [name], MD  06/30/2020 4:01 PM

## 2020-07-03 ENCOUNTER — Encounter (INDEPENDENT_AMBULATORY_CARE_PROVIDER_SITE_OTHER): Payer: Medicare Other

## 2020-07-03 ENCOUNTER — Ambulatory Visit (INDEPENDENT_AMBULATORY_CARE_PROVIDER_SITE_OTHER): Payer: Medicare Other | Admitting: Vascular Surgery

## 2020-07-03 DIAGNOSIS — K219 Gastro-esophageal reflux disease without esophagitis: Secondary | ICD-10-CM

## 2020-07-03 DIAGNOSIS — I10 Essential (primary) hypertension: Secondary | ICD-10-CM

## 2020-07-03 DIAGNOSIS — E785 Hyperlipidemia, unspecified: Secondary | ICD-10-CM

## 2020-07-03 DIAGNOSIS — E1122 Type 2 diabetes mellitus with diabetic chronic kidney disease: Secondary | ICD-10-CM

## 2020-07-03 DIAGNOSIS — I70213 Atherosclerosis of native arteries of extremities with intermittent claudication, bilateral legs: Secondary | ICD-10-CM

## 2020-07-26 ENCOUNTER — Ambulatory Visit: Payer: Medicare Other | Admitting: Gastroenterology

## 2020-07-27 ENCOUNTER — Ambulatory Visit (INDEPENDENT_AMBULATORY_CARE_PROVIDER_SITE_OTHER): Payer: Medicare Other | Admitting: Vascular Surgery

## 2020-07-27 ENCOUNTER — Encounter (INDEPENDENT_AMBULATORY_CARE_PROVIDER_SITE_OTHER): Payer: Self-pay | Admitting: Vascular Surgery

## 2020-07-27 ENCOUNTER — Ambulatory Visit (INDEPENDENT_AMBULATORY_CARE_PROVIDER_SITE_OTHER): Payer: Medicare Other

## 2020-07-27 ENCOUNTER — Other Ambulatory Visit: Payer: Self-pay

## 2020-07-27 VITALS — BP 122/78 | HR 78 | Ht 67.0 in | Wt 198.0 lb

## 2020-07-27 DIAGNOSIS — N183 Chronic kidney disease, stage 3 unspecified: Secondary | ICD-10-CM

## 2020-07-27 DIAGNOSIS — E785 Hyperlipidemia, unspecified: Secondary | ICD-10-CM

## 2020-07-27 DIAGNOSIS — E1122 Type 2 diabetes mellitus with diabetic chronic kidney disease: Secondary | ICD-10-CM

## 2020-07-27 DIAGNOSIS — I7025 Atherosclerosis of native arteries of other extremities with ulceration: Secondary | ICD-10-CM

## 2020-07-27 DIAGNOSIS — I509 Heart failure, unspecified: Secondary | ICD-10-CM | POA: Diagnosis not present

## 2020-07-27 DIAGNOSIS — I70261 Atherosclerosis of native arteries of extremities with gangrene, right leg: Secondary | ICD-10-CM | POA: Diagnosis not present

## 2020-07-27 DIAGNOSIS — I10 Essential (primary) hypertension: Secondary | ICD-10-CM | POA: Diagnosis not present

## 2020-07-27 DIAGNOSIS — Z9862 Peripheral vascular angioplasty status: Secondary | ICD-10-CM

## 2020-08-01 ENCOUNTER — Encounter (INDEPENDENT_AMBULATORY_CARE_PROVIDER_SITE_OTHER): Payer: Self-pay | Admitting: Vascular Surgery

## 2020-08-01 DIAGNOSIS — I7025 Atherosclerosis of native arteries of other extremities with ulceration: Secondary | ICD-10-CM | POA: Insufficient documentation

## 2020-08-01 NOTE — Progress Notes (Signed)
MRN : [id number]  [name] [name] is a 75 y.o. (October 20, 1945) female who presents with chief complaint of  Chief Complaint  Patient presents with  . Follow-up    1 mo U/S  .  History of Present Illness:  The patient returns to the office for followup and review status post angiogram with intervention on 05/30/20:  Procedure: Percutaneous transluminal angioplasty right anterior tibialarteryto 3 mm  The patient notes improvement in the lower extremity symptoms. No interval shortening of the patient's claudication distance or rest pain symptoms. Previous wounds have now healed, right 2nd toe toe amp site is closed and well healed.  No new ulcers or wounds have occurred since the last visit.  There have been no significant changes to the patient's overall health care.  The patient denies amaurosis fugax or recent TIA symptoms. There are no recent neurological changes noted. The patient denies history of DVT, PE or superficial thrombophlebitis. The patient denies recent episodes of angina or shortness of breath.    Current Meds  Medication Sig  . amLODipine (NORVASC) 10 MG tablet Take 1 tablet by mouth daily.  . ASPIRIN 81 PO Take by mouth daily.  Marland Kitchen atorvastatin (LIPITOR) 40 MG tablet Take 1 tablet by mouth at bedtime.   . bisacodyl (DULCOLAX) 10 MG suppository Place 1 suppository (10 mg total) rectally as needed for moderate constipation.  . carvedilol (COREG) 25 MG tablet Take 1 tablet by mouth 2 (two) times daily.  Marland Kitchen donepezil (ARICEPT) 10 MG tablet Take 10 mg by mouth at bedtime.   . insulin aspart protamine- aspart (NOVOLOG MIX 70/30) (70-30) 100 UNIT/ML injection Inject 15 Units into the skin 2 (two) times daily with a meal.  . polyethylene glycol (MIRALAX / GLYCOLAX) 17 g packet Take 17 g by mouth daily. Mix one tablespoon with 8oz of your favorite juice or water every day until you are having soft formed stools. Then start taking once daily if you didn't have a  stool the day before.  Marland Kitchen SYNTHROID 50 MCG tablet Take 1 tablet by mouth daily.  Tyler Aas FLEXTOUCH 100 UNIT/ML SOPN FlexTouch Pen Inject 80 Units into the skin at bedtime.     Past Medical History:  Diagnosis Date  . Chronic diastolic CHF (congestive heart failure), NYHA class 3 ([place]) 06/30/2019  . Diabetes mellitus without complication ([place])   . Humerus fracture 04/20/2018   left  . Hyperlipidemia   . Hypertension   . Hypothyroidism   . Vertigo    several yrs ago  . Wears dentures    partial upper and lower (loose)    Past Surgical History:  Procedure Laterality Date  . AMPUTATION TOE Right 05/31/2020   Procedure: AMPUTATION TOE;  Surgeon: Caroline More, DPM;  Location: [facility];  Service: Podiatry;  Laterality: Right;  . CATARACT EXTRACTION W/PHACO Left 12/01/2018   Procedure: CATARACT EXTRACTION PHACO AND INTRAOCULAR LENS PLACEMENT ([place]) LEFT DIABETIC;  Surgeon: Birder Robson, MD;  Location: [place];  Service: Ophthalmology;  Laterality: Left;  Diabetic - insulin  . COLONOSCOPY WITH PROPOFOL N/A 07/07/2019   Procedure: COLONOSCOPY WITH PROPOFOL;  Surgeon: Robert Bellow, MD;  Location: [facility];  Service: Endoscopy;  Laterality: N/A;  . ESOPHAGOGASTRODUODENOSCOPY (EGD) WITH PROPOFOL N/A 07/07/2019   Procedure: ESOPHAGOGASTRODUODENOSCOPY (EGD) WITH PROPOFOL;  Surgeon: Robert Bellow, MD;  Location: [facility];  Service: Endoscopy;  Laterality: N/A;  . FOOT SURGERY Right    lesion excision  . LOWER EXTREMITY ANGIOGRAPHY Right 05/19/2020  Procedure: Lower Extremity Angiography;  Surgeon: Katha Cabal, MD;  Location: [place] [facility];  Service: Cardiovascular;  Laterality: Right;  . LOWER EXTREMITY ANGIOGRAPHY Right 05/30/2020   Procedure: Lower Extremity Angiography;  Surgeon: Katha Cabal, MD;  Location: [place] [facility];  Service: Cardiovascular;  Laterality: Right;  Pedal Approach    Social History Social History   Tobacco  Use  . Smoking status: Never Smoker  . Smokeless tobacco: Never Used  Vaping Use  . Vaping Use: Never used  Substance Use Topics  . Alcohol use: No  . Drug use: Never    Family History Family History  Problem Relation Age of Onset  . Breast cancer Neg Hx     No Known Allergies   REVIEW OF SYSTEMS (Negative unless checked)  Constitutional: [] Weight loss  [] Fever  [] Chills Cardiac: [] Chest pain   [] Chest pressure   [] Palpitations   [] Shortness of breath when laying flat   [] Shortness of breath with exertion. Vascular:  [] Pain in legs with walking   [x] Pain in legs at rest  [] History of DVT   [] Phlebitis   [] Swelling in legs   [] Varicose veins   [] Non-healing ulcers Pulmonary:   [] Uses home oxygen   [] Productive cough   [] Hemoptysis   [] Wheeze  [] COPD   [] Asthma Neurologic:  [] Dizziness   [] Seizures   [] History of stroke   [] History of TIA  [] Aphasia   [] Vissual changes   [] Weakness or numbness in arm   [x] Weakness or numbness in leg Musculoskeletal:   [] Joint swelling   [x] Joint pain   [] Low back pain Hematologic:  [] Easy bruising  [] Easy bleeding   [] Hypercoagulable state   [] Anemic Gastrointestinal:  [] Diarrhea   [] Vomiting  [] Gastroesophageal reflux/heartburn   [] Difficulty swallowing. Genitourinary:  [] Chronic kidney disease   [] Difficult urination  [] Frequent urination   [] Blood in urine Skin:  [] Rashes   [] Ulcers  Psychological:  [] History of anxiety   []  History of major depression.  Physical Examination  Vitals:   07/27/20 1614  BP: 122/78  Pulse: 78  Weight: 198 lb (89.8 kg)  Height: 5\' 7"  (1.702 m)   Body mass index is 31.01 kg/m. Gen: WD/WN, NAD Head: [place]/AT, No temporalis wasting.  Ear/Nose/Throat: Hearing grossly intact, nares w/o erythema or drainage Eyes: PER, EOMI, sclera nonicteric.  Neck: Supple, no large masses.   Pulmonary:  Good air movement, no audible wheezing bilaterally, no use of accessory muscles.  Cardiac: RRR, no JVD Vascular:  Right 2nd  toe amp healed Vessel Right Left  Radial Palpable Palpable  PT Not Palpable Not Palpable  DP Not Palpable Not Palpable  Gastrointestinal: Non-distended. No guarding/no peritoneal signs.  Musculoskeletal: M/S 5/5 throughout.  No deformity or atrophy.  Neurologic: CN 2-12 intact. Symmetrical.  Speech is fluent. Motor exam as listed above. Psychiatric: Judgment intact, Mood & affect appropriate for pt's clinical situation. Dermatologic: No rashes or ulcers noted.  No changes consistent with cellulitis.   CBC Lab Results  Component Value Date   WBC 8.2 06/01/2020   HGB 7.9 (L) 06/01/2020   HCT 23.8 (L) 06/01/2020   MCV 93.3 06/01/2020   PLT 212 06/01/2020    BMET    Component Value Date/Time   NA 140 06/03/2020 0541   NA 142 04/17/2014 1131   K 4.4 06/03/2020 0541   K 4.0 04/17/2014 1131   CL 111 06/03/2020 0541   CL 109 (H) 04/17/2014 1131   CO2 22 06/03/2020 0541   CO2 24 04/17/2014 1131  GLUCOSE 86 06/03/2020 0541   GLUCOSE 89 04/17/2014 1131   BUN 16 06/03/2020 0541   BUN 22 (H) 04/17/2014 1131   CREATININE 2.02 (H) 06/03/2020 0541   CREATININE 1.78 (H) 04/17/2014 1131   CALCIUM 8.3 (L) 06/03/2020 0541   CALCIUM 8.8 04/17/2014 1131   GFRNONAA 26 (L) 06/03/2020 0541   GFRNONAA 30 (L) 04/17/2014 1131   GFRNONAA 37 (L) 04/22/2013 0946   GFRAA 29 (L) 12/07/2019 0645   GFRAA 37 (L) 04/17/2014 1131   GFRAA 42 (L) 04/22/2013 0946   CrCl cannot be calculated (Patient's most recent lab result is older than the maximum 21 days allowed.).  COAG Lab Results  Component Value Date   INR 1.2 10/11/2019   INR 1.2 10/10/2019    Radiology VAS [place] ABI WITH/WO TBI  Result Date: 07/27/2020 LOWER EXTREMITY DOPPLER STUDY Indications: S/P Angioplasty.  Vascular Interventions: 02/11/2022Rt Lower Extremity Angiography thrid order                         catheter placement.                          05/30/2020: Ultrasound guided access to the Right ATA.                         PTA  Right ATA to 86mm. Performing Technologist: Almira Coaster RVS  Examination Guidelines: A complete evaluation includes at minimum, Doppler waveform signals and systolic blood pressure reading at the level of bilateral brachial, anterior tibial, and posterior tibial arteries, when vessel segments are accessible. Bilateral testing is considered an integral part of a complete examination. Photoelectric Plethysmograph (PPG) waveforms and toe systolic pressure readings are included as required and additional duplex testing as needed. Limited examinations for reoccurring indications may be performed as noted.  ABI Findings: +---------+------------------+-----+--------+--------+ Right    Rt Pressure (mmHg)IndexWaveformComment  +---------+------------------+-----+--------+--------+ Brachial 125                                     +---------+------------------+-----+--------+--------+ ATA      250               1.92 biphasicNC       +---------+------------------+-----+--------+--------+ PTA      88                0.68 biphasic         +---------+------------------+-----+--------+--------+ Great Toe14                0.11 Abnormal         +---------+------------------+-----+--------+--------+ +---------+------------------+-----+----------+-------+ Left     Lt Pressure (mmHg)IndexWaveform  Comment +---------+------------------+-----+----------+-------+ Brachial 130                                      +---------+------------------+-----+----------+-------+ ATA      246               1.89 biphasic  [place]      +---------+------------------+-----+----------+-------+ PTA      137               1.05 monophasic        +---------+------------------+-----+----------+-------+ Lenon Ahmadi  0.68 Abnormal          +---------+------------------+-----+----------+-------+ +-------+-----------+-----------+------------+------------+ ABI/TBIToday's ABIToday's TBIPrevious  ABIPrevious TBI +-------+-----------+-----------+------------+------------+ Right  >1.0 [place]    .11                                 +-------+-----------+-----------+------------+------------+ Left   >1.0 [place]    .68                                 +-------+-----------+-----------+------------+------------+  Summary: Right: Resting right ankle-brachial index indicates noncompressible right lower extremity arteries. The right toe-brachial index is abnormal. Left: Resting left ankle-brachial index indicates noncompressible left lower extremity arteries. The left toe-brachial index is abnormal.  *See table(s) above for measurements and observations.  Electronically signed by [name] MD on 07/27/2020 at 5:08:50 PM.    Final      Assessment/Plan 1. Atherosclerosis of native arteries of the extremities with ulceration ([place]) Recommend:  The patient is status post successful angiogram with intervention.  The patient reports that the claudication symptoms and leg pain is essentially gone.   The patient denies lifestyle limiting changes at this point in time.  No further invasive studies, angiography or surgery at this time The patient should continue walking and begin a more formal exercise program.  The patient should continue antiplatelet therapy and aggressive treatment of the lipid abnormalities  Smoking cessation was again discussed  The patient should continue wearing graduated compression socks 10-15 mmHg strength to control the mild edema.  Patient should undergo noninvasive studies as ordered. The patient will follow up with me after the studies.   - VAS [place] ABI WITH/WO TBI; Future  2. Congestive heart failure, unspecified HF chronicity, unspecified heart failure type ([place]) Continue cardiac and antihypertensive medications as already ordered and reviewed, no changes at this time.  Continue statin as ordered and reviewed, no changes at this time  Nitrates PRN for chest  pain   3. Primary hypertension Continue antihypertensive medications as already ordered, these medications have been reviewed and there are no changes at this time.   4. Type 2 diabetes mellitus with stage 3 chronic kidney disease, unspecified whether long term insulin use, unspecified whether stage 3a or 3b CKD ([place]) Continue hypoglycemic medications as already ordered, these medications have been reviewed and there are no changes at this time.  Hgb A1C to be monitored as already arranged by primary service   5. Hyperlipidemia, unspecified hyperlipidemia type Continue statin as ordered and reviewed, no changes at this time     [name], MD  08/01/2020 8:08 PM

## 2020-10-23 IMAGING — CR DG CHEST 2V
1 series · 2 of 2 positions shown · non-contrast
Comparison: 01/28/2019.

CLINICAL DATA: Shortness of breath.

EXAM:
CHEST - 2 VIEW

[Series 1: dg chest 2 view · 0.14mm/px · 2 of 2 slices shown]
[im 1/2]
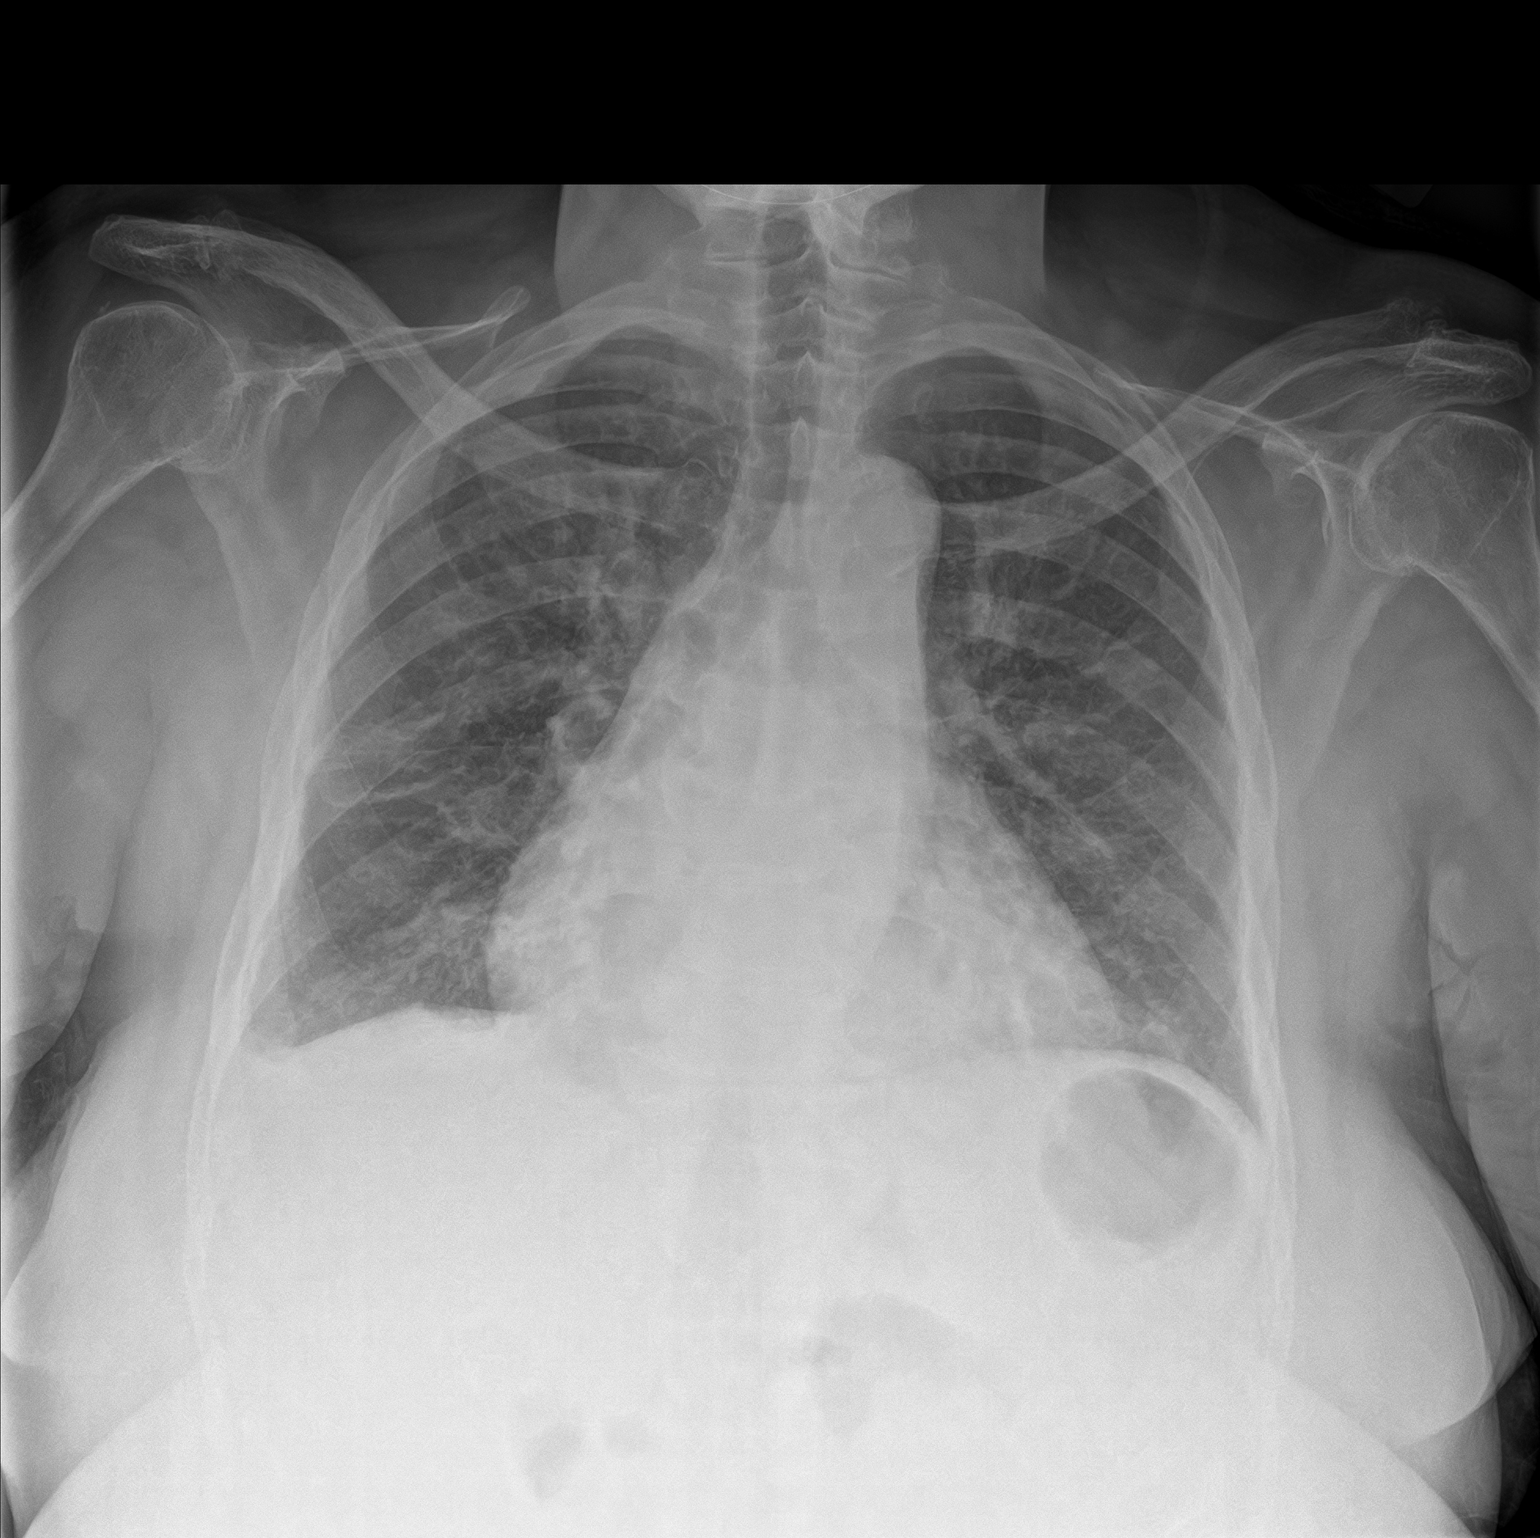
[im 2/2]
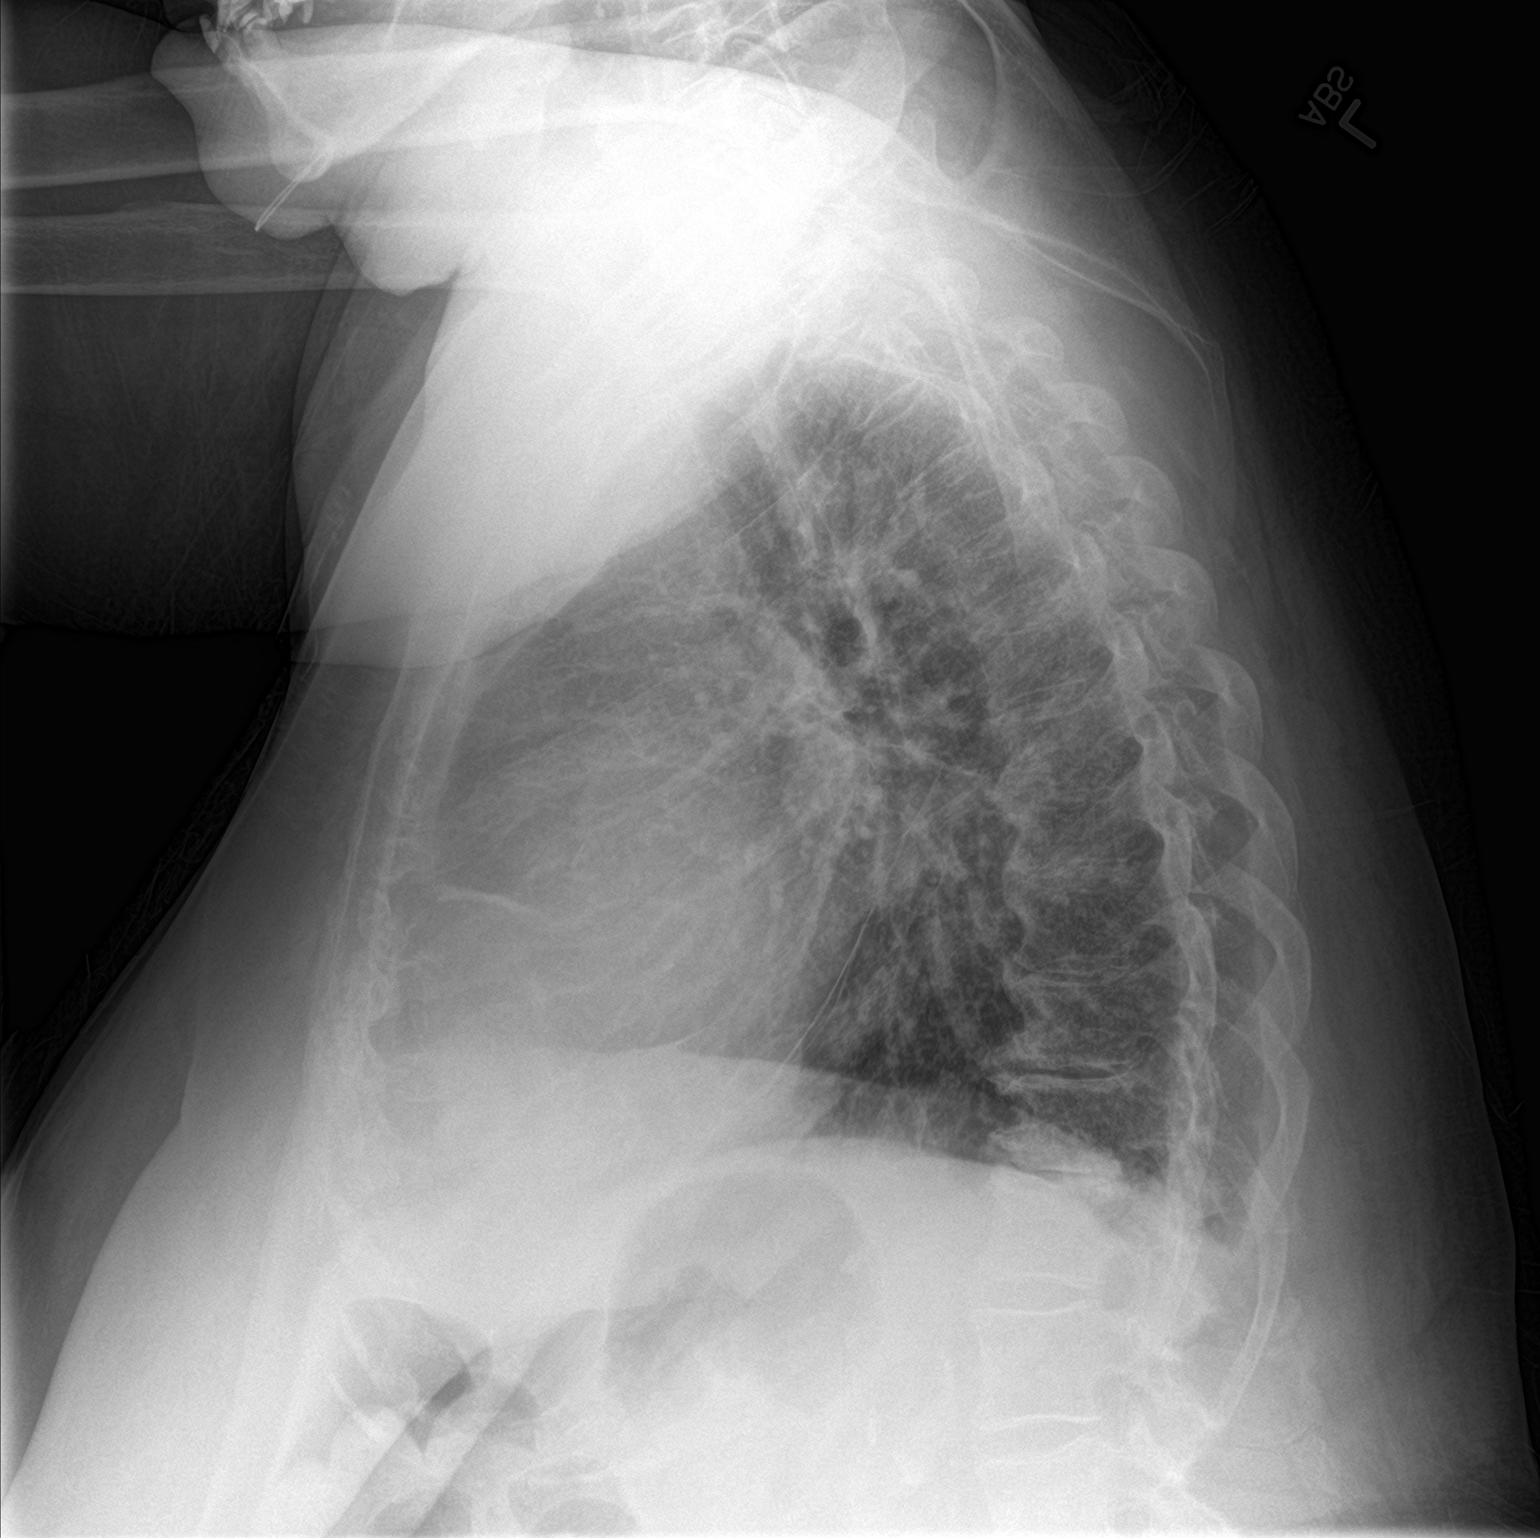

[2 of 2 positions shown; findings below may reference images not displayed]

FINDINGS: Mediastinum and hilar structures normal. Cardiomegaly with mild
pulmonary venous congestion. Slight increase interstitial markings
noted bilaterally. Interstitial edema and/or pneumonitis could
present this fashion. Small right pleural effusion. No pneumothorax.
IMPRESSION: Cardiomegaly with mild pulmonary venous congestion and bilateral
interstitial prominence. Small right pleural effusion. Findings
suggest mild CHF. Pneumonitis cannot be excluded.

## 2020-10-26 ENCOUNTER — Ambulatory Visit (INDEPENDENT_AMBULATORY_CARE_PROVIDER_SITE_OTHER): Payer: Medicare Other | Admitting: Nurse Practitioner

## 2020-10-26 ENCOUNTER — Encounter (INDEPENDENT_AMBULATORY_CARE_PROVIDER_SITE_OTHER): Payer: Medicare Other

## 2021-03-16 IMAGING — US US ABDOMEN LIMITED
1 series · 14 of 25 positions shown · non-contrast
Comparison: Ultrasound dated April 21, 2013.

CLINICAL DATA: Pain

EXAM:
ULTRASOUND ABDOMEN LIMITED RIGHT UPPER QUADRANT

[Series 1: us abdomen limited ruq · 14 of 25 slices shown]
[im 1/25]
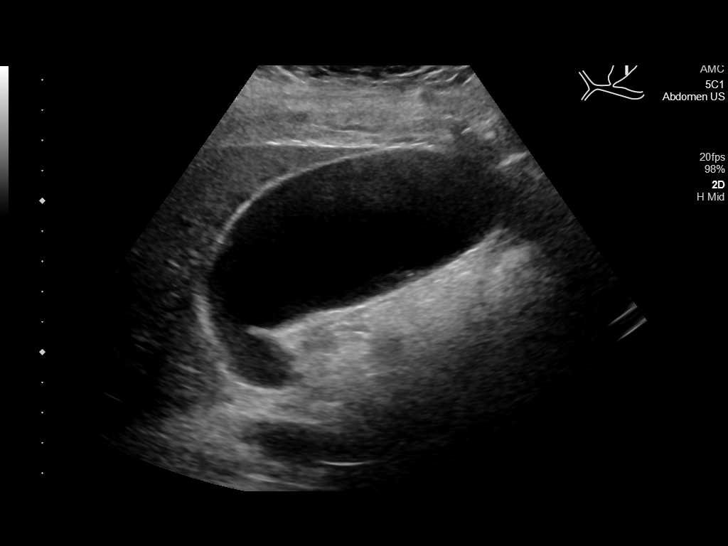
[im 3/25]
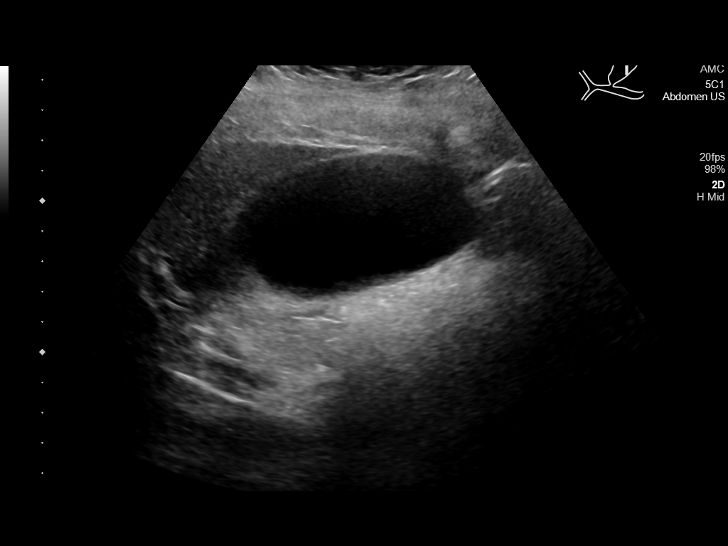
[im 5/25]
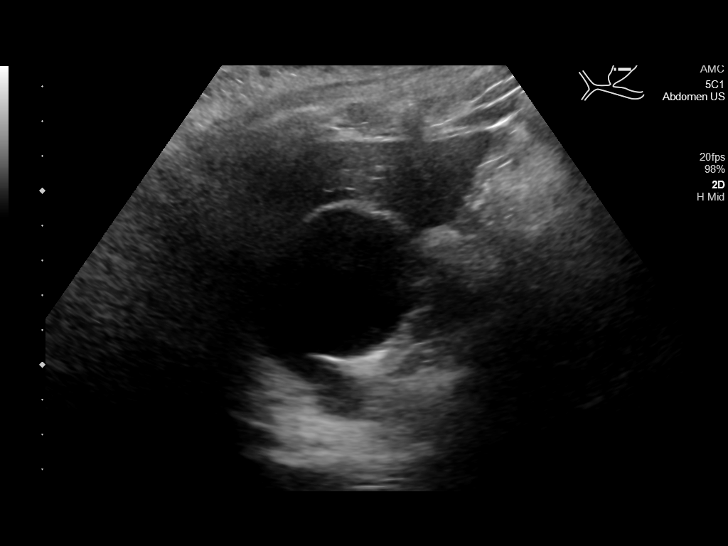
[im 7/25]
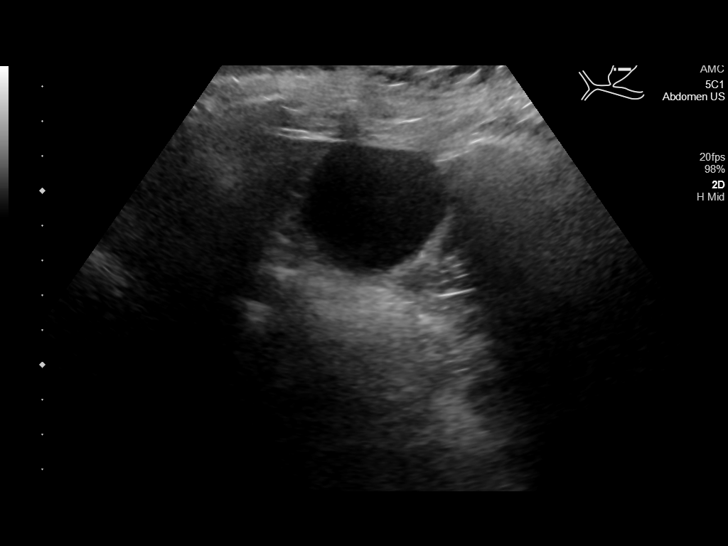
[im 9/25]
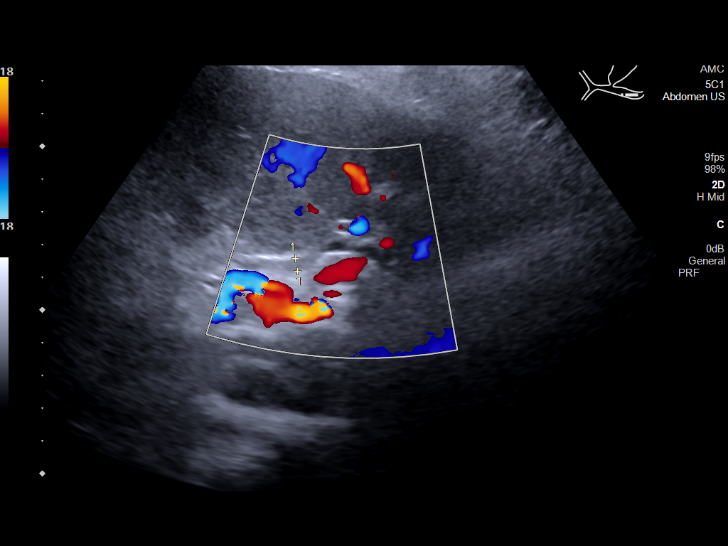
[im 10/25]
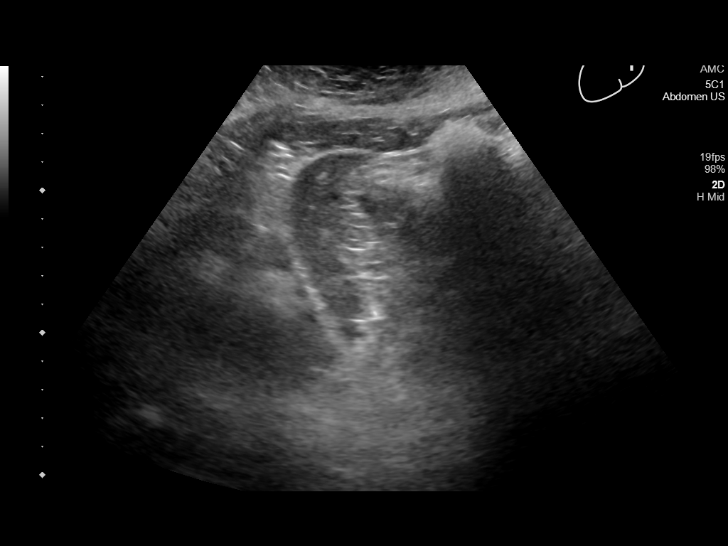
[im 12/25]
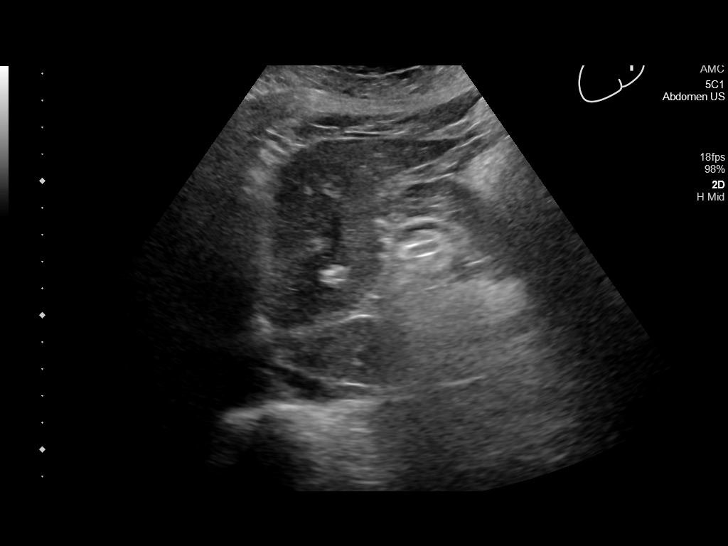
[im 14/25]
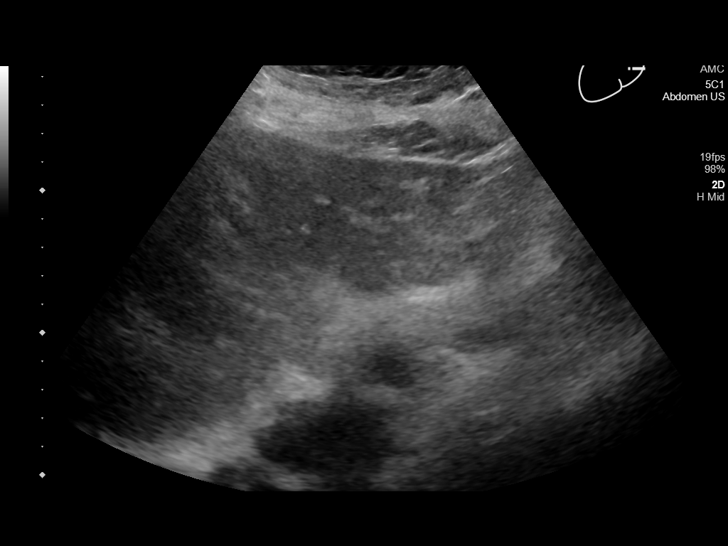
[im 16/25]
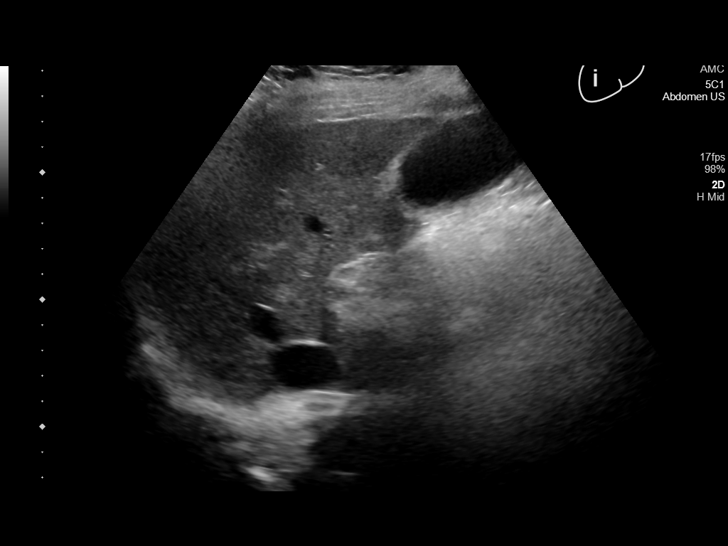
[im 17/25]
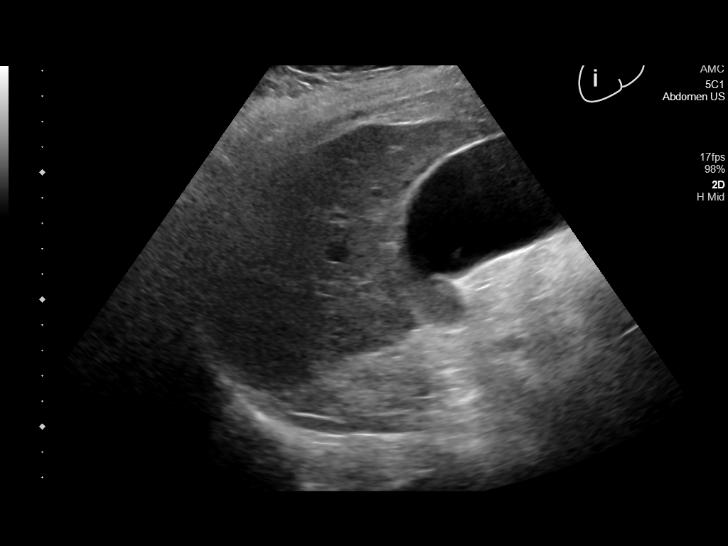
[im 19/25]
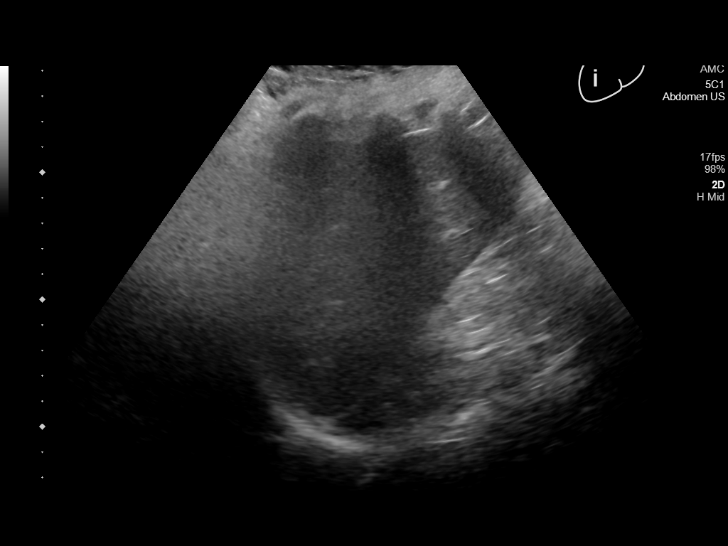
[im 21/25]
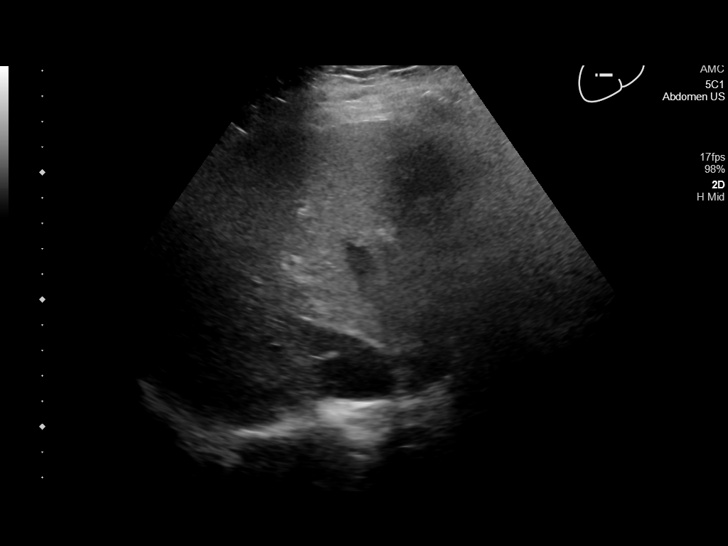
[im 23/25]
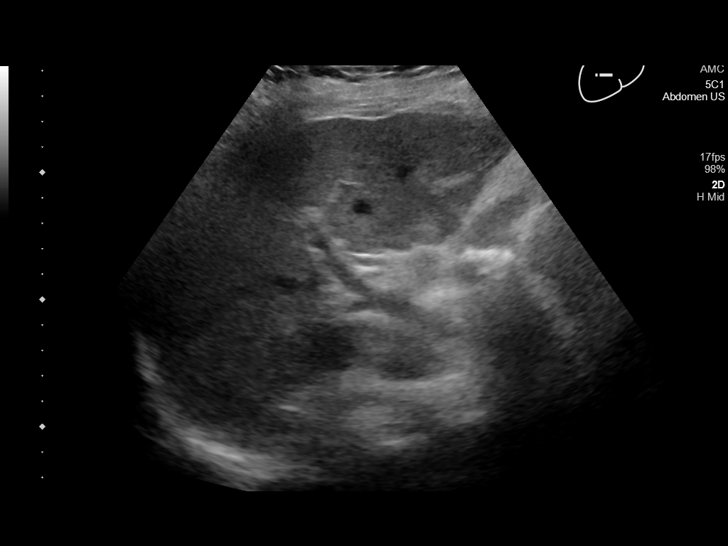
[im 25/25]
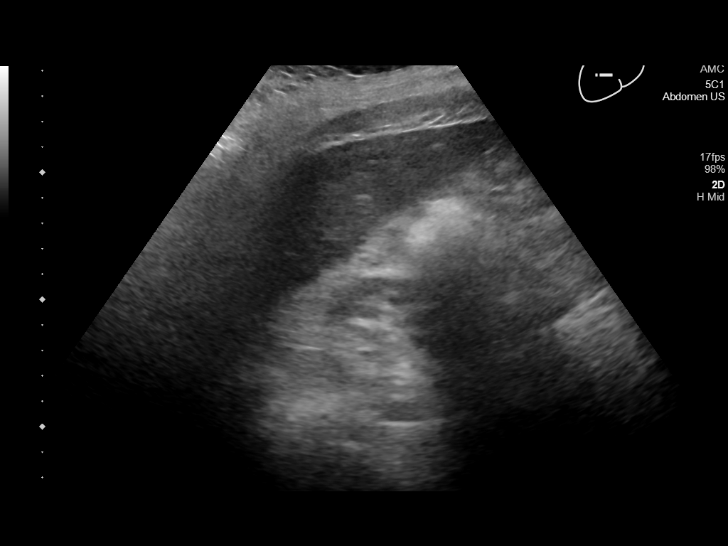

[14 of 25 positions shown; findings below may reference images not displayed]

FINDINGS: Gallbladder:

The gallbladder is distended with a small amount of gallbladder
sludge. There is no evidence for cholelithiasis. There is no
gallbladder wall thickening. The sonographic Murphy sign is
negative.

Common bile duct:

Diameter: 4 mm

Liver:

Diffuse increased echogenicity with slightly heterogeneous liver.
Appearance typically secondary to fatty infiltration. Fibrosis
secondary consideration. No secondary findings of cirrhosis noted.
No focal hepatic lesion or intrahepatic biliary duct dilatation.
Portal vein is patent on color Doppler imaging with normal direction
of blood flow towards the liver.

Other: None.
IMPRESSION: 1. Distended gallbladder without evidence for cholelithiasis or
acute cholecystitis.
2. Hepatic steatosis.
3. There is a small amount of gallbladder sludge within the
gallbladder lumen.

## 2021-03-17 IMAGING — US US RENAL
1 series · 14 of 25 positions shown · non-contrast
Comparison: None.

CLINICAL DATA: Chronic kidney disease stage 4

EXAM:
RENAL / URINARY TRACT ULTRASOUND COMPLETE

[Series 1: us renal · 14 of 29 slices shown]
[im 1/29]
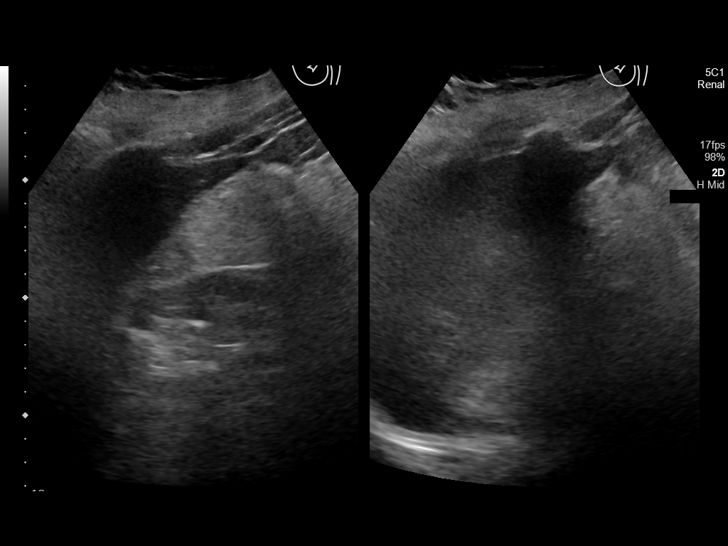
[im 3/29]
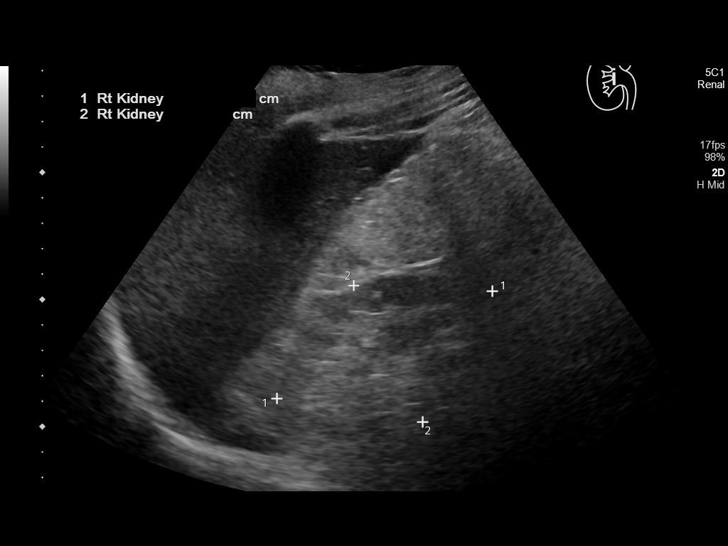
[im 5/29]
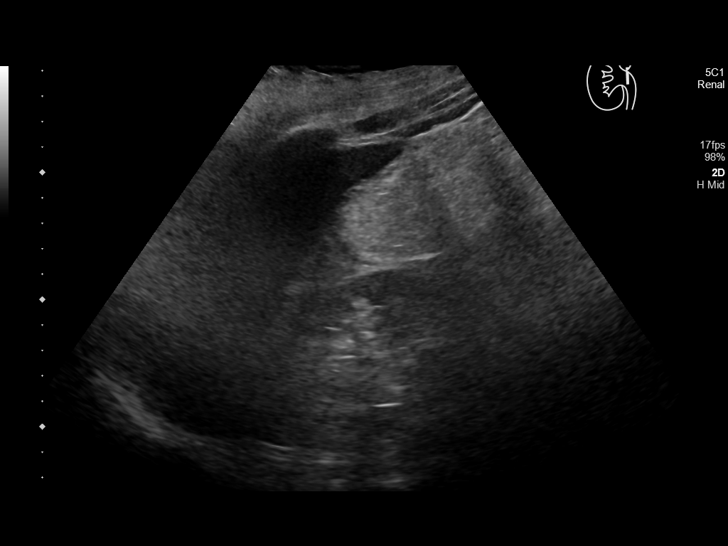
[im 8/29]
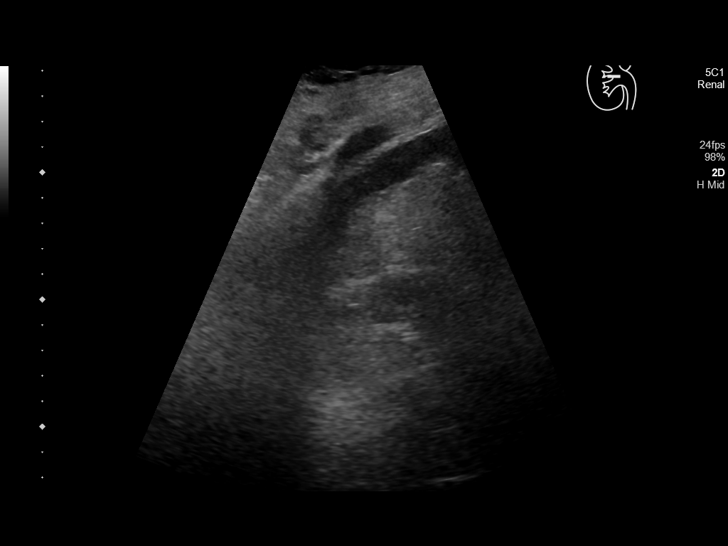
[im 10/29]
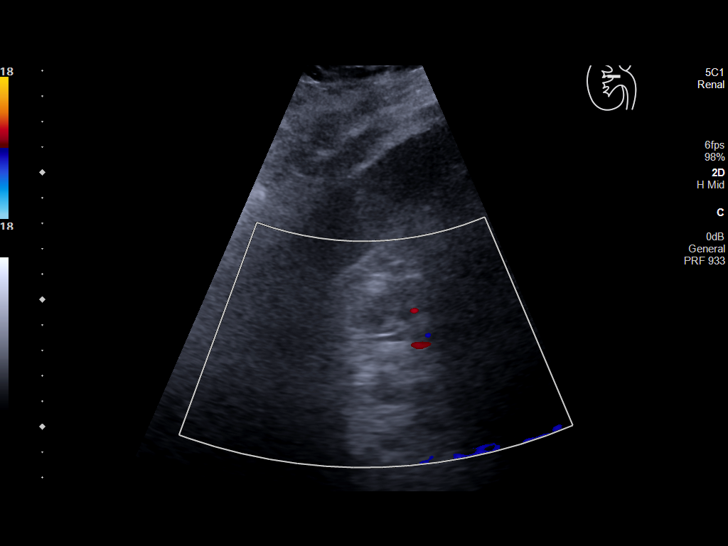
[im 11/29]
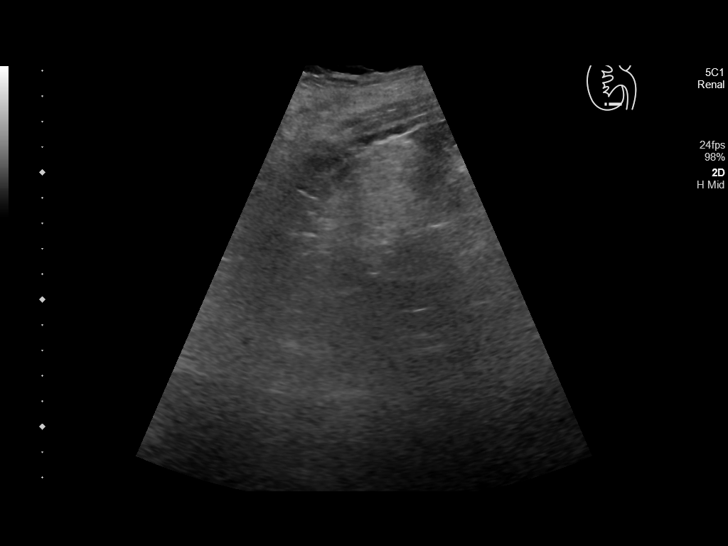
[im 13/29]
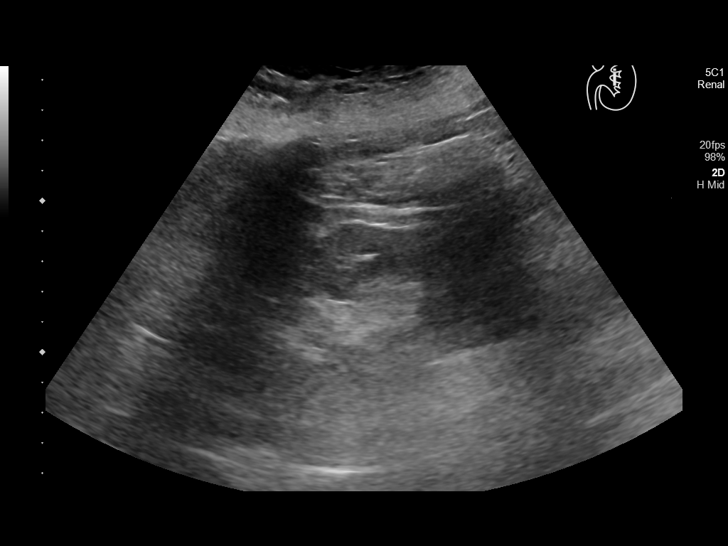
[im 16/29]
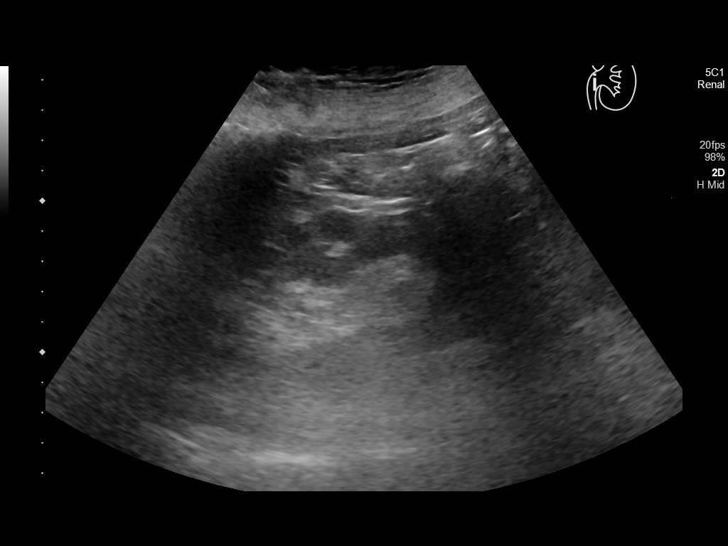
[im 18/29]
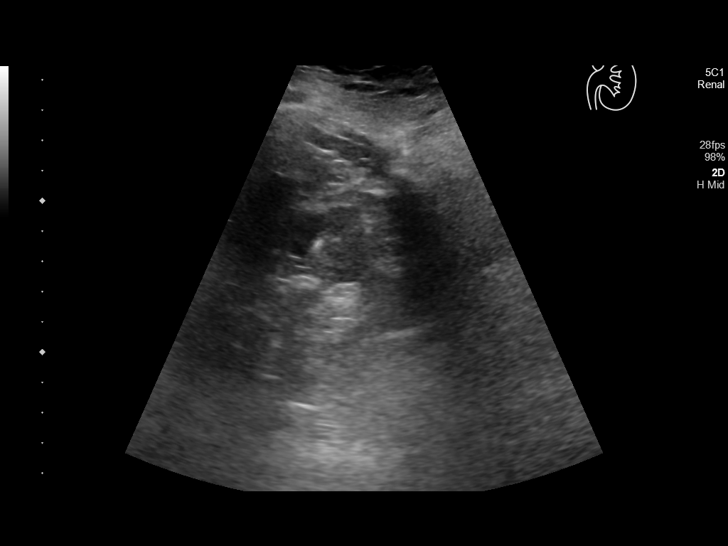
[im 19/29]
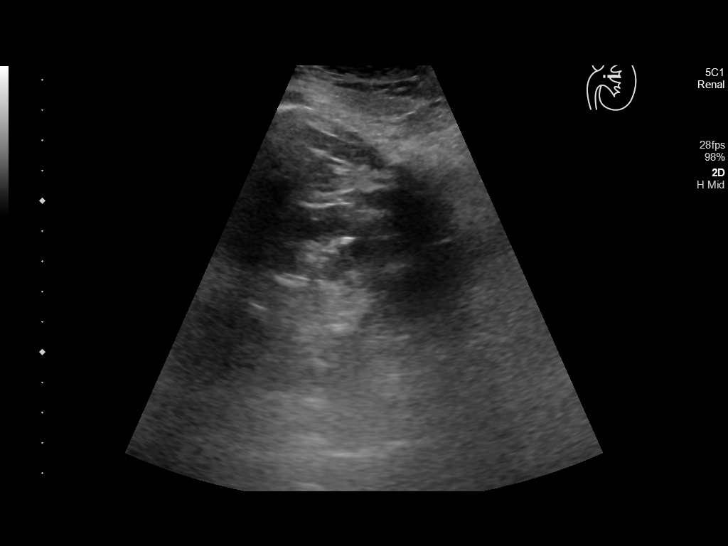
[im 22/29]
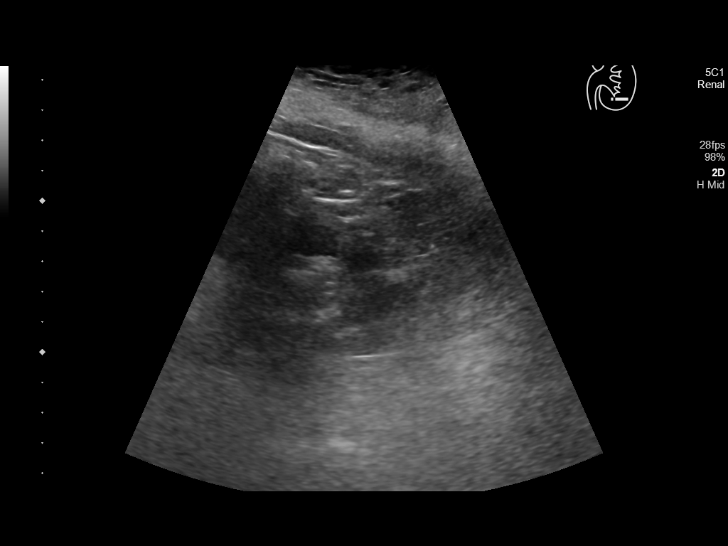
[im 24/29]
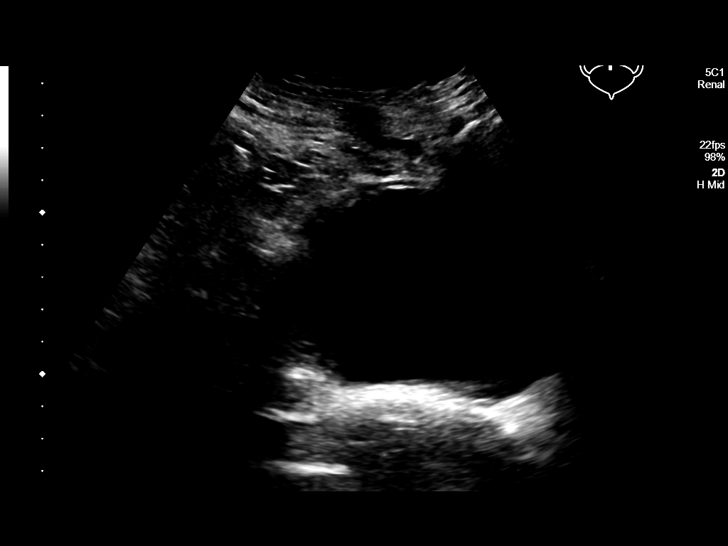
[im 26/29]
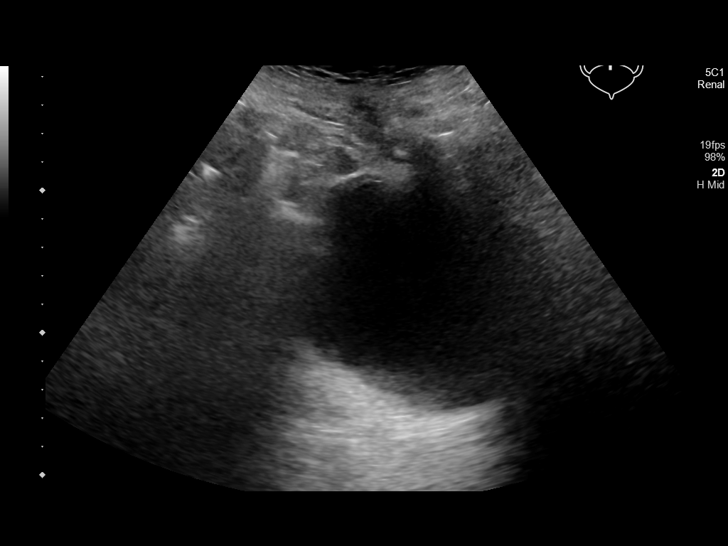
[im 29/29]
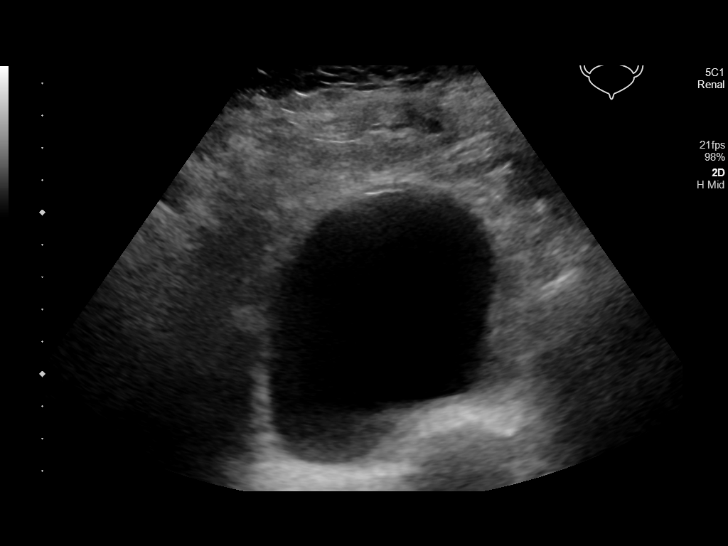

[14 of 25 positions shown; findings below may reference images not displayed]

FINDINGS: Right Kidney:

Renal measurements: 9.5 x 6.0 x 5.1 cm = volume: 153 mL. Cortical
thinning. Increased echotexture. No mass or hydronephrosis. Small
calcifications, likely nonobstructing stones.

Left Kidney:

Renal measurements: 9.5 x 4.6 x 5.1 cm = volume: 116 mL. Cortical
thinning with increased echotexture. No mass or hydronephrosis.
Small calcifications, likely nonobstructing stones.

Bladder:

Appears normal for degree of bladder distention.

Other:

None.
IMPRESSION: No acute findings.  No hydronephrosis.

Cortical thinning and increased echotexture.

Bilateral nonobstructing nephrolithiasis.

## 2021-03-20 ENCOUNTER — Emergency Department: Payer: Medicare Other

## 2021-03-20 ENCOUNTER — Observation Stay: Payer: Medicare Other

## 2021-03-20 ENCOUNTER — Other Ambulatory Visit: Payer: Self-pay

## 2021-03-20 ENCOUNTER — Inpatient Hospital Stay
Admission: EM | Admit: 2021-03-20 | Discharge: 2021-03-31 | DRG: 252 | Disposition: A | Discharge status: Home Health | Payer: Medicare Other | Attending: Internal Medicine | Admitting: Internal Medicine

## 2021-03-20 DIAGNOSIS — I5032 Chronic diastolic (congestive) heart failure: Secondary | ICD-10-CM | POA: Diagnosis present

## 2021-03-20 DIAGNOSIS — E1152 Type 2 diabetes mellitus with diabetic peripheral angiopathy with gangrene: Principal | ICD-10-CM | POA: Diagnosis present

## 2021-03-20 DIAGNOSIS — E1142 Type 2 diabetes mellitus with diabetic polyneuropathy: Secondary | ICD-10-CM | POA: Diagnosis present

## 2021-03-20 DIAGNOSIS — L89153 Pressure ulcer of sacral region, stage 3: Secondary | ICD-10-CM | POA: Diagnosis present

## 2021-03-20 DIAGNOSIS — I10 Essential (primary) hypertension: Secondary | ICD-10-CM

## 2021-03-20 DIAGNOSIS — F03A0 Unspecified dementia, mild, without behavioral disturbance, psychotic disturbance, mood disturbance, and anxiety: Secondary | ICD-10-CM | POA: Diagnosis present

## 2021-03-20 DIAGNOSIS — M869 Osteomyelitis, unspecified: Secondary | ICD-10-CM

## 2021-03-20 DIAGNOSIS — N179 Acute kidney failure, unspecified: Secondary | ICD-10-CM | POA: Diagnosis not present

## 2021-03-20 DIAGNOSIS — L97429 Non-pressure chronic ulcer of left heel and midfoot with unspecified severity: Secondary | ICD-10-CM

## 2021-03-20 DIAGNOSIS — L97428 Non-pressure chronic ulcer of left heel and midfoot with other specified severity: Secondary | ICD-10-CM | POA: Diagnosis present

## 2021-03-20 DIAGNOSIS — Z993 Dependence on wheelchair: Secondary | ICD-10-CM

## 2021-03-20 DIAGNOSIS — I70268 Atherosclerosis of native arteries of extremities with gangrene, other extremity: Secondary | ICD-10-CM | POA: Diagnosis present

## 2021-03-20 DIAGNOSIS — M86172 Other acute osteomyelitis, left ankle and foot: Secondary | ICD-10-CM | POA: Diagnosis present

## 2021-03-20 DIAGNOSIS — E1122 Type 2 diabetes mellitus with diabetic chronic kidney disease: Secondary | ICD-10-CM | POA: Diagnosis present

## 2021-03-20 DIAGNOSIS — Z79890 Hormone replacement therapy (postmenopausal): Secondary | ICD-10-CM

## 2021-03-20 DIAGNOSIS — L089 Local infection of the skin and subcutaneous tissue, unspecified: Secondary | ICD-10-CM | POA: Diagnosis present

## 2021-03-20 DIAGNOSIS — G928 Other toxic encephalopathy: Secondary | ICD-10-CM | POA: Diagnosis not present

## 2021-03-20 DIAGNOSIS — L97509 Non-pressure chronic ulcer of other part of unspecified foot with unspecified severity: Secondary | ICD-10-CM | POA: Diagnosis present

## 2021-03-20 DIAGNOSIS — E11628 Type 2 diabetes mellitus with other skin complications: Secondary | ICD-10-CM | POA: Diagnosis present

## 2021-03-20 DIAGNOSIS — M86672 Other chronic osteomyelitis, left ankle and foot: Secondary | ICD-10-CM | POA: Diagnosis present

## 2021-03-20 DIAGNOSIS — B999 Unspecified infectious disease: Secondary | ICD-10-CM | POA: Diagnosis not present

## 2021-03-20 DIAGNOSIS — E440 Moderate protein-calorie malnutrition: Secondary | ICD-10-CM | POA: Insufficient documentation

## 2021-03-20 DIAGNOSIS — E876 Hypokalemia: Secondary | ICD-10-CM | POA: Diagnosis not present

## 2021-03-20 DIAGNOSIS — E13621 Other specified diabetes mellitus with foot ulcer: Secondary | ICD-10-CM | POA: Diagnosis present

## 2021-03-20 DIAGNOSIS — E1151 Type 2 diabetes mellitus with diabetic peripheral angiopathy without gangrene: Secondary | ICD-10-CM | POA: Diagnosis present

## 2021-03-20 DIAGNOSIS — D631 Anemia in chronic kidney disease: Secondary | ICD-10-CM | POA: Diagnosis present

## 2021-03-20 DIAGNOSIS — Z79899 Other long term (current) drug therapy: Secondary | ICD-10-CM

## 2021-03-20 DIAGNOSIS — R338 Other retention of urine: Secondary | ICD-10-CM

## 2021-03-20 DIAGNOSIS — Z6831 Body mass index (BMI) 31.0-31.9, adult: Secondary | ICD-10-CM

## 2021-03-20 DIAGNOSIS — Z794 Long term (current) use of insulin: Secondary | ICD-10-CM

## 2021-03-20 DIAGNOSIS — I4719 Other supraventricular tachycardia: Secondary | ICD-10-CM

## 2021-03-20 DIAGNOSIS — I130 Hypertensive heart and chronic kidney disease with heart failure and stage 1 through stage 4 chronic kidney disease, or unspecified chronic kidney disease: Secondary | ICD-10-CM | POA: Diagnosis present

## 2021-03-20 DIAGNOSIS — E11610 Type 2 diabetes mellitus with diabetic neuropathic arthropathy: Secondary | ICD-10-CM | POA: Diagnosis present

## 2021-03-20 DIAGNOSIS — Z20822 Contact with and (suspected) exposure to covid-19: Secondary | ICD-10-CM | POA: Diagnosis present

## 2021-03-20 DIAGNOSIS — N184 Chronic kidney disease, stage 4 (severe): Secondary | ICD-10-CM | POA: Diagnosis present

## 2021-03-20 DIAGNOSIS — I441 Atrioventricular block, second degree: Secondary | ICD-10-CM | POA: Diagnosis not present

## 2021-03-20 DIAGNOSIS — L89620 Pressure ulcer of left heel, unstageable: Secondary | ICD-10-CM | POA: Diagnosis present

## 2021-03-20 DIAGNOSIS — E039 Hypothyroidism, unspecified: Secondary | ICD-10-CM | POA: Diagnosis present

## 2021-03-20 DIAGNOSIS — D649 Anemia, unspecified: Secondary | ICD-10-CM

## 2021-03-20 DIAGNOSIS — R9401 Abnormal electroencephalogram [EEG]: Secondary | ICD-10-CM

## 2021-03-20 DIAGNOSIS — E785 Hyperlipidemia, unspecified: Secondary | ICD-10-CM | POA: Diagnosis present

## 2021-03-20 DIAGNOSIS — I471 Supraventricular tachycardia: Secondary | ICD-10-CM | POA: Diagnosis not present

## 2021-03-20 DIAGNOSIS — E872 Acidosis, unspecified: Secondary | ICD-10-CM | POA: Diagnosis not present

## 2021-03-20 DIAGNOSIS — Z7401 Bed confinement status: Secondary | ICD-10-CM

## 2021-03-20 DIAGNOSIS — E1169 Type 2 diabetes mellitus with other specified complication: Secondary | ICD-10-CM | POA: Diagnosis present

## 2021-03-20 DIAGNOSIS — E11621 Type 2 diabetes mellitus with foot ulcer: Secondary | ICD-10-CM | POA: Diagnosis present

## 2021-03-20 DIAGNOSIS — G9341 Metabolic encephalopathy: Secondary | ICD-10-CM

## 2021-03-20 DIAGNOSIS — Z7982 Long term (current) use of aspirin: Secondary | ICD-10-CM

## 2021-03-20 DIAGNOSIS — M109 Gout, unspecified: Secondary | ICD-10-CM | POA: Diagnosis present

## 2021-03-20 LAB — COMPREHENSIVE METABOLIC PANEL
ALT: 9 U/L (ref 0–44)
AST: 20 U/L (ref 15–41)
Albumin: 3.3 g/dL — ABNORMAL LOW (ref 3.5–5.0)
Alkaline Phosphatase: 87 U/L (ref 38–126)
Anion gap: 6 (ref 5–15)
BUN: 33 mg/dL — ABNORMAL HIGH (ref 8–23)
CO2: 21 mmol/L — ABNORMAL LOW (ref 22–32)
Calcium: 9.1 mg/dL (ref 8.9–10.3)
Chloride: 109 mmol/L (ref 98–111)
Creatinine, Ser: 1.67 mg/dL — ABNORMAL HIGH (ref 0.44–1.00)
GFR, Estimated: 32 mL/min — ABNORMAL LOW (ref >60)
Glucose, Bld: 141 mg/dL — ABNORMAL HIGH (ref 70–99)
Potassium: 4.2 mmol/L (ref 3.5–5.1)
Sodium: 136 mmol/L (ref 135–145)
Total Bilirubin: 0.7 mg/dL (ref 0.3–1.2)
Total Protein: 8.5 g/dL — ABNORMAL HIGH (ref 6.5–8.1)

## 2021-03-20 LAB — CBC WITH DIFFERENTIAL/PLATELET
Abs Immature Granulocytes: 0.05 10*3/uL (ref 0.00–0.07)
Basophils Absolute: 0 10*3/uL (ref 0.0–0.1)
Basophils Relative: 0 %
Eosinophils Absolute: 0.2 10*3/uL (ref 0.0–0.5)
Eosinophils Relative: 2 %
HCT: 34 % — ABNORMAL LOW (ref 36.0–46.0)
Hemoglobin: 10.8 g/dL — ABNORMAL LOW (ref 12.0–15.0)
Immature Granulocytes: 1 %
Lymphocytes Relative: 34 %
Lymphs Abs: 3.1 10*3/uL (ref 0.7–4.0)
MCH: 29.6 pg (ref 26.0–34.0)
MCHC: 31.8 g/dL (ref 30.0–36.0)
MCV: 93.2 fL (ref 80.0–100.0)
Monocytes Absolute: 0.8 10*3/uL (ref 0.1–1.0)
Monocytes Relative: 8 %
Neutro Abs: 5 10*3/uL (ref 1.7–7.7)
Neutrophils Relative %: 55 %
Platelets: 231 10*3/uL (ref 150–400)
RBC: 3.65 MIL/uL — ABNORMAL LOW (ref 3.87–5.11)
RDW: 13.4 % (ref 11.5–15.5)
WBC: 9.1 10*3/uL (ref 4.0–10.5)
nRBC: 0 % (ref 0.0–0.2)

## 2021-03-20 LAB — RESP PANEL BY RT-PCR (FLU A&B, COVID) ARPGX2
Influenza A by PCR: NEGATIVE
Influenza B by PCR: NEGATIVE
SARS Coronavirus 2 by RT PCR: NEGATIVE

## 2021-03-20 LAB — GLUCOSE, CAPILLARY: Glucose-Capillary: 163 mg/dL — ABNORMAL HIGH (ref 70–99)

## 2021-03-20 LAB — LACTIC ACID, PLASMA: Lactic Acid, Venous: 1.2 mmol/L (ref 0.5–1.9)

## 2021-03-20 MED ORDER — ACETAMINOPHEN 325 MG PO TABS
650.0000 mg | ORAL_TABLET | Freq: Four times a day (QID) | ORAL | Status: DC | PRN
  Administered 2021-03-22 – 2021-03-23 (×2): 650 mg via ORAL
  Filled 2021-03-20 (×2): qty 2

## 2021-03-20 MED ORDER — CARVEDILOL 25 MG PO TABS
25.0000 mg | ORAL_TABLET | Freq: Two times a day (BID) | ORAL | Status: DC
  Administered 2021-03-20 – 2021-03-23 (×4): 25 mg via ORAL
  Filled 2021-03-20 (×4): qty 1

## 2021-03-20 MED ORDER — DONEPEZIL HCL 5 MG PO TABS
10.0000 mg | ORAL_TABLET | Freq: Every day | ORAL | Status: DC
  Administered 2021-03-20 – 2021-03-30 (×8): 10 mg via ORAL
  Filled 2021-03-20 (×8): qty 2

## 2021-03-20 MED ORDER — POLYETHYLENE GLYCOL 3350 17 G PO PACK
17.0000 g | PACK | Freq: Every day | ORAL | Status: DC
  Administered 2021-03-23 – 2021-03-31 (×4): 17 g via ORAL
  Filled 2021-03-20 (×5): qty 1

## 2021-03-20 MED ORDER — ENOXAPARIN SODIUM 40 MG/0.4ML IJ SOSY
40.0000 mg | PREFILLED_SYRINGE | INTRAMUSCULAR | Status: DC
  Administered 2021-03-20: 40 mg via SUBCUTANEOUS
  Filled 2021-03-20: qty 0.4

## 2021-03-20 MED ORDER — BISACODYL 10 MG RE SUPP
10.0000 mg | RECTAL | Status: DC | PRN
  Filled 2021-03-20: qty 1

## 2021-03-20 MED ORDER — AMLODIPINE BESYLATE 10 MG PO TABS
10.0000 mg | ORAL_TABLET | Freq: Every day | ORAL | Status: DC
  Administered 2021-03-21 – 2021-03-31 (×5): 10 mg via ORAL
  Filled 2021-03-20 (×6): qty 1

## 2021-03-20 MED ORDER — LEVOTHYROXINE SODIUM 50 MCG PO TABS
50.0000 ug | ORAL_TABLET | Freq: Every day | ORAL | Status: DC
  Administered 2021-03-21 – 2021-03-24 (×4): 50 ug via ORAL
  Filled 2021-03-20 (×6): qty 1

## 2021-03-20 MED ORDER — VANCOMYCIN HCL IN DEXTROSE 1-5 GM/200ML-% IV SOLN
1000.0000 mg | Freq: Once | INTRAVENOUS | Status: AC
  Administered 2021-03-20: 1000 mg via INTRAVENOUS
  Filled 2021-03-20: qty 200

## 2021-03-20 MED ORDER — ACETAMINOPHEN 650 MG RE SUPP: 650.0000 mg | Freq: Four times a day (QID) | RECTAL | Status: DC | PRN

## 2021-03-20 MED ORDER — INSULIN GLARGINE-YFGN 100 UNIT/ML ~~LOC~~ SOLN
15.0000 [IU] | Freq: Every day | SUBCUTANEOUS | Status: DC
  Administered 2021-03-21 – 2021-03-23 (×2): 15 [IU] via SUBCUTANEOUS
  Filled 2021-03-20 (×6): qty 0.15

## 2021-03-20 MED ORDER — INSULIN ASPART 100 UNIT/ML IJ SOLN
0.0000 [IU] | Freq: Three times a day (TID) | INTRAMUSCULAR | Status: DC
  Administered 2021-03-21: 12:00:00 1 [IU] via SUBCUTANEOUS
  Filled 2021-03-20: qty 1

## 2021-03-20 MED ORDER — SODIUM CHLORIDE 0.9 % IV SOLN
2.0000 g | Freq: Once | INTRAVENOUS | Status: AC
  Administered 2021-03-20: 2 g via INTRAVENOUS
  Filled 2021-03-20: qty 2

## 2021-03-20 MED ORDER — ASPIRIN 81 MG PO CHEW
81.0000 mg | CHEWABLE_TABLET | Freq: Every day | ORAL | Status: DC
  Administered 2021-03-21 – 2021-03-23 (×2): 81 mg via ORAL
  Filled 2021-03-20 (×3): qty 1

## 2021-03-20 MED ORDER — ATORVASTATIN CALCIUM 20 MG PO TABS
40.0000 mg | ORAL_TABLET | Freq: Every day | ORAL | Status: DC
  Administered 2021-03-21 – 2021-03-31 (×5): 40 mg via ORAL
  Filled 2021-03-20 (×6): qty 2

## 2021-03-20 NOTE — H&P (Signed)
History and Physical    [name] [name] ATF:[id number] DOB: Jun 28, 1945 DOA: 03/20/2021 PCP: Marguerita Merles, MD  Chief Complaint: foot wound Historian: patient HPI:  [name] [name] is a 75 y.o. female with a PMH significant for Type II DM, HTN, CKD 4, hypothyroidism, H LD, GERD, Charcot foot, anemia. They presented from home where she lives with her son to the ED on 03/20/2021 with left heel ulcer x several weeks.  At baseline, patient does not walk or perform any of her own ADLs.  They have a nursing assistant to address the home to provide her care and was the one who noticed her foot wound today was draining purulent material. In the ED, it was found that they had purulent foot ulcer with no signs of osteomyelitis on x-ray.  They were treated with IV antibiotics and analgesia.  Podiatry was consulted Patient was admitted to medicine service for further workup and management of diabetic foot ulcer as outlined in detail below.  Review of Systems: As per HPI otherwise 10 point review of systems negative.   Past Medical History:  Diagnosis Date   Chronic diastolic CHF (congestive heart failure), NYHA class 3 ([place]) 06/30/2019   Diabetes mellitus without complication ([place])    Humerus fracture 04/20/2018   left   Hyperlipidemia    Hypertension    Hypothyroidism    Vertigo    several yrs ago   Wears dentures    partial upper and lower (loose)    Past Surgical History:  Procedure Laterality Date   AMPUTATION TOE Right 05/31/2020   Procedure: AMPUTATION TOE;  Surgeon: Caroline More, DPM;  Location: [facility];  Service: Podiatry;  Laterality: Right;   CATARACT EXTRACTION W/PHACO Left 12/01/2018   Procedure: CATARACT EXTRACTION PHACO AND INTRAOCULAR LENS PLACEMENT ([place]) LEFT DIABETIC;  Surgeon: Birder Robson, MD;  Location: [place];  Service: Ophthalmology;  Laterality: Left;  Diabetic - insulin   COLONOSCOPY WITH PROPOFOL N/A 07/07/2019   Procedure: COLONOSCOPY WITH PROPOFOL;   Surgeon: Robert Bellow, MD;  Location: [facility];  Service: Endoscopy;  Laterality: N/A;   ESOPHAGOGASTRODUODENOSCOPY (EGD) WITH PROPOFOL N/A 07/07/2019   Procedure: ESOPHAGOGASTRODUODENOSCOPY (EGD) WITH PROPOFOL;  Surgeon: Robert Bellow, MD;  Location: [facility];  Service: Endoscopy;  Laterality: N/A;   FOOT SURGERY Right    lesion excision   LOWER EXTREMITY ANGIOGRAPHY Right 05/19/2020   Procedure: Lower Extremity Angiography;  Surgeon: Katha Cabal, MD;  Location: [place] [facility];  Service: Cardiovascular;  Laterality: Right;   LOWER EXTREMITY ANGIOGRAPHY Right 05/30/2020   Procedure: Lower Extremity Angiography;  Surgeon: Katha Cabal, MD;  Location: [place] [facility];  Service: Cardiovascular;  Laterality: Right;  Pedal Approach     reports that she has never smoked. She has never used smokeless tobacco. She reports that she does not drink alcohol and does not use drugs.  No Known Allergies  Family History  Problem Relation Age of Onset   Breast cancer Neg Hx     Prior to Admission medications   Medication Sig Start Date End Date Taking? Authorizing Provider  amLODipine (NORVASC) 10 MG tablet Take 1 tablet by mouth daily. 05/02/16  Yes [provider]  ASPIRIN 81 PO Take by mouth daily.   Yes [provider]  atorvastatin (LIPITOR) 40 MG tablet Take 1 tablet by mouth at bedtime.  05/02/16  Yes [provider]  bisacodyl (DULCOLAX) 10 MG suppository Place 1 suppository (10 mg total) rectally as needed  for moderate constipation. 04/02/20  Yes Merlyn Lot, MD  carvedilol (COREG) 25 MG tablet Take 1 tablet by mouth 2 (two) times daily. 05/02/16  Yes [provider]  donepezil (ARICEPT) 10 MG tablet Take 10 mg by mouth at bedtime.  04/21/18  Yes [provider]  insulin aspart protamine- aspart (NOVOLOG MIX 70/30) (70-30) 100 UNIT/ML injection Inject 15 Units into the skin 2 (two) times daily with a meal.    Yes [provider]  polyethylene glycol (MIRALAX / GLYCOLAX) 17 g packet Take 17 g by mouth daily. Mix one tablespoon with 8oz of your favorite juice or water every day until you are having soft formed stools. Then start taking once daily if you didn't have a stool the day before. 04/02/20  Yes Merlyn Lot, MD  SYNTHROID 50 MCG tablet Take 1 tablet by mouth daily. 04/21/18  Yes [provider]  TRESIBA FLEXTOUCH 100 UNIT/ML SOPN FlexTouch Pen Inject 80 Units into the skin at bedtime.  04/21/18  Yes [provider]  gabapentin (NEURONTIN) 100 MG capsule Take 1 capsule (100 mg total) by mouth 2 (two) times daily as needed for up to 14 days. 06/02/20 06/16/20  Sidney Ace, MD   I have personally, briefly reviewed patient's prior medical records in [place]  Physical Exam: Vitals:   03/20/21 1446 03/20/21 1630 03/20/21 1715 03/20/21 1800  BP: 133/65 (!) 150/64 128/70 (!) 150/66  Pulse: 63 (!) 59 62 68  Resp: 20 18 18 18   Temp: 98 F (36.7 C)   97.9 F (36.6 C)  TempSrc: Oral     SpO2: 95% 98% 100% 98%  Weight: 90.7 kg     Height: 5\' 7"  (1.702 m)       Constitutional: NAD, calm, comfortable HEENT: lids and conjunctivae normal Mucous membranes are moist.  Poor dentition Neck: normal, supple, no masses, no thyromegaly Respiratory: CTAB, no wheezing, no crackles. Normal respiratory effort. No accessory muscle use.  Cardiovascular: RRR, no murmurs.  Nonpitting bilateral lower extremity edema.  Difficult to palpate pedal pulses bilaterally.. no clubbing / cyanosis.  Musculoskeletal: Decreased muscle tone.  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  Tenderness to palpation of lower left foot. Skin: dry, intact, normal color, normal temperature on exposed skin.  On left medial heel there is a ulcer that possibly probes to bone. Neurologic: Alert and conversational normal speech.  Grossly non-focal exam.  Psychiatric: Normal mood. Congruent  affect.  Normal judgement and insight.  Labs on Admission: I have personally reviewed following labs and imaging studies  CBC: Recent Labs  Lab 03/20/21 1446  WBC 9.1  NEUTROABS 5.0  HGB 10.8*  HCT 34.0*  MCV 93.2  PLT 106   Basic Metabolic Panel: Recent Labs  Lab 03/20/21 1446  NA 136  K 4.2  CL 109  CO2 21*  GLUCOSE 141*  BUN 33*  CREATININE 1.67*  CALCIUM 9.1   GFR: Estimated Creatinine Clearance: 34.2 mL/min (A) (by C-G formula based on SCr of 1.67 mg/dL (H)). Liver Function Tests: Recent Labs  Lab 03/20/21 1446  AST 20  ALT 9  ALKPHOS 87  BILITOT 0.7  PROT 8.5*  ALBUMIN 3.3*    Urine analysis:    Component Value Date/Time   COLORURINE YELLOW (A) 12/04/2019 1159   APPEARANCEUR HAZY (A) 12/04/2019 1159   APPEARANCEUR Clear 04/21/2013 0715   LABSPEC 1.013 12/04/2019 1159   LABSPEC 1.015 04/21/2013 0715   PHURINE 7.0 12/04/2019 1159   GLUCOSEU  NEGATIVE 12/04/2019 1159   GLUCOSEU 50 mg/dL 04/21/2013 0715   HGBUR SMALL (A) 12/04/2019 1159   BILIRUBINUR NEGATIVE 12/04/2019 1159   BILIRUBINUR Negative 04/21/2013 0715   KETONESUR NEGATIVE 12/04/2019 1159   PROTEINUR NEGATIVE 12/04/2019 1159   NITRITE NEGATIVE 12/04/2019 1159   LEUKOCYTESUR TRACE (A) 12/04/2019 1159   LEUKOCYTESUR Negative 04/21/2013 0715    Radiological Exams on Admission: DG Knee 2 Views Left  Result Date: 03/20/2021 CLINICAL DATA:  Bilateral knee pain and swelling, fell EXAM: RIGHT KNEE - 1-2 VIEW; LEFT KNEE - 1-2 VIEW COMPARISON:  03/07/2015 FINDINGS: Left knee: Frontal and lateral views are obtained. The bones are severely osteopenic. No acute fracture, subluxation, or dislocation. There is severe 3 compartmental osteoarthritis greatest in the patellofemoral compartment. No joint effusion. Diffuse atherosclerosis. Right knee: Frontal and lateral views are obtained. Bones are severely osteopenic. No fracture, subluxation, or dislocation. Severe 3 compartmental osteoarthritis greatest  in the medial and lateral compartments. Trace joint effusion. Diffuse atherosclerosis. IMPRESSION: 1. No acute displaced fractures. 2. Severe bilateral 3 compartmental osteoarthritis. 3. Severe osteopenia. Electronically Signed   By: Randa Ngo M.D.   On: 03/20/2021 16:16   DG Knee 2 Views Right  Result Date: 03/20/2021 CLINICAL DATA:  Bilateral knee pain and swelling, fell EXAM: RIGHT KNEE - 1-2 VIEW; LEFT KNEE - 1-2 VIEW COMPARISON:  03/07/2015 FINDINGS: Left knee: Frontal and lateral views are obtained. The bones are severely osteopenic. No acute fracture, subluxation, or dislocation. There is severe 3 compartmental osteoarthritis greatest in the patellofemoral compartment. No joint effusion. Diffuse atherosclerosis. Right knee: Frontal and lateral views are obtained. Bones are severely osteopenic. No fracture, subluxation, or dislocation. Severe 3 compartmental osteoarthritis greatest in the medial and lateral compartments. Trace joint effusion. Diffuse atherosclerosis. IMPRESSION: 1. No acute displaced fractures. 2. Severe bilateral 3 compartmental osteoarthritis. 3. Severe osteopenia. Electronically Signed   By: Randa Ngo M.D.   On: 03/20/2021 16:16   DG Foot 2 Views Left  Result Date: 03/20/2021 CLINICAL DATA:  Open wound EXAM: LEFT FOOT - 2 VIEW COMPARISON:  None. FINDINGS: Ulcer noted overlying the calcaneus in the posterior heel soft tissues. No underlying bone destruction to suggest osteomyelitis. Diffuse osteopenia. No acute bony abnormality. Specifically, no fracture, subluxation, or dislocation. Calcification in the distal Achilles tendon region, likely related to old injury. Plantar calcaneal spur. Vascular calcifications noted. IMPRESSION: No acute bony abnormality. No evidence of osteomyelitis by plain films. Electronically Signed   By: Rolm Baptise M.D.   On: 03/20/2021 17:23    EKG: Independently reviewed.   Assessment/Plan Principal Problem:   Foot ulcer due to secondary  DM ([place])   Diabetic left heel ulcer   decreased pedal pulses-likely will have poor wound healing. -Continue IV antibiotics -Podiatry following, appreciate recommendations -Wound care -PT/OT -Analgesia as needed  Type II DM-hemoglobin A1c in process -Semglee daily -Sensitive sliding scale insulin  HTN  PAD   HFpEF EF 60-65%-moderately well-controlled BPs -Continue home therapy  CKD 4-creatinine 1.67 appears to be at or better than her baseline -Avoid nephrotoxic agents -BMP a.m.  Hypothyroidism-does not have any medications on her home list -TSH a.m.   DVT prophylaxis: enoxaparin (LOVENOX) injection 40 mg Start: 03/20/21 2200   Code Status: Full Family Communication: Son at bedside Disposition Plan: Home Consults called: Podiatry  Richarda Osmond, DO [facility]  03/20/2021, 6:27 PM    To contact the appropriate [place] Attending or Consulting provider: Check the care team in [facility] for a) attending/consulting [facility] provider name  and b) the [facility] team listed Log into [email or web address] and use [place]'s universal password to access. If you do not have the password, please contact the hospital operator. Locate the [facility] provider you are looking for under [facility] and page to a number that you can be directly reached. (Text pages appreciated) If you still have difficulty reaching the provider, please page the [facility] (Director on Call) for the Hospitalists listed on amion for assistance.

## 2021-03-20 NOTE — Consult Note (Addendum)
PODIATRY CONSULTATION  NAME [name] [name] MRN [id number] DOB [birth year] DOA 03/20/2021   Reason for consult: LT heel decubitus ulcer Chief Complaint  Patient presents with   Wound Infection    Consulting physician: Doristine Mango MD  History of present illness: 75 y.o. female nonambulatory and wheelchair-bound PMHx type II DM, HTN, CKD 4, Charcot foot right, peripheral vascular disease presenting for left heel ulcer that has been present for several weeks.  Patient is unsure how long she has had the wound for.  Patient was admitted and treated empirically with IV vancomycin and cefepime and podiatry consulted.  She presents this evening at bedside  Patient has past surgical history of right heel and toe ulceration which led to second toe amputation, and RLE angiography early 2022.  Last ABIs 07/27/2020.  Past Medical History:  Diagnosis Date   Chronic diastolic CHF (congestive heart failure), NYHA class 3 ([place]) 06/30/2019   Diabetes mellitus without complication ([place])    Humerus fracture 04/20/2018   left   Hyperlipidemia    Hypertension    Hypothyroidism    Vertigo    several yrs ago   Wears dentures    partial upper and lower (loose)    CBC Latest Ref Rng & Units 03/20/2021 06/01/2020 05/31/2020  WBC 4.0 - 10.5 K/uL 9.1 8.2 8.0  Hemoglobin 12.0 - 15.0 g/dL 10.8(L) 7.9(L) 7.5(L)  Hematocrit 36.0 - 46.0 % 34.0(L) 23.8(L) 23.8(L)  Platelets 150 - 400 K/uL 231 212 205    BMP Latest Ref Rng & Units 03/20/2021 06/03/2020 06/01/2020  Glucose 70 - 99 mg/dL 141(H) 86 95  BUN 8 - 23 mg/dL 33(H) 16 14  Creatinine 0.44 - 1.00 mg/dL 1.67(H) 2.02(H) 1.94(H)  Sodium 135 - 145 mmol/L 136 140 137  Potassium 3.5 - 5.1 mmol/L 4.2 4.4 4.1  Chloride 98 - 111 mmol/L 109 111 108  CO2 22 - 32 mmol/L 21(L) 22 20(L)  Calcium 8.9 - 10.3 mg/dL 9.1 8.3(L) 8.3(L)       Physical Exam: General: The patient is alert and oriented x3 in no acute distress.   Dermatology: Unstageable decubitus  ulcer left posterior heel.  Moderate malodor.  Minimal serous drainage.  Please see above attached photo.  Vascular: VAS [place] ABI W/WO TBI 07/27/2020: Summary:  Right: Resting right ankle-brachial index indicates noncompressible right lower extremity arteries. The right toe-brachial index is abnormal.   Left: Resting left ankle-brachial index indicates noncompressible left  lower extremity arteries. The left toe-brachial index is abnormal.   Neurological: Light touch and protective threshold diminished bilateral lower extremities  Musculoskeletal Exam: Nonambulatory and bedbound.  No significant lower extremity contracture.  Stable chronic Charcot deformity right foot.  Evidence of right second toe amputation which is healed.    ASSESSMENT/PLAN OF CARE Decubitus ulcer left heel -Patient will need additional imaging of the heel.  MRI ordered LT heel without contrast. -Recommend vascular consult.  Patient has a known history of peripheral vascular disease and is known to [facility] and Vascular services -Patient will need surgical OR debridement and likely bone biopsy after MRI and vascular consultation. -Updated ABIs ordered bilateral lower extremities -In the meantime recommend dry sterile dressings to the wound with offloading.  Recommend pillows under the leg to offload pressure from the heel -Podiatry will continue to follow    Thank you for the consult.  Please contact me directly with any questions or concerns.  [facility] [place], [place]  Dr. Edrick Kins, DPM    [address], [place] [address]                Office [phone]  Fax [phone]

## 2021-03-20 NOTE — ED Notes (Signed)
Foul odor coming from wound

## 2021-03-20 NOTE — ED Triage Notes (Signed)
Pt to ED [facility] from home for wound infection to left foot. States has HH, and [facility] nurse sent here.  Diabetic Alert and oriented.

## 2021-03-20 NOTE — ED Provider Notes (Signed)
[facility] Emergency Department Provider Note   ____________________________________________   Event Date/Time   First MD Initiated Contact with Patient 03/20/21 1621     (approximate)  I have reviewed the triage vital signs and the nursing notes.   HISTORY  Chief Complaint Wound Infection    HPI [name] [name] is a 75 y.o. female with past medical history of hypertension, hyperlipidemia, diabetes, CKD, hypothyroidism, and gout who presents to the ED complaining of wound infection.  Patient's son reports that a couple of months ago the patient developed an pressure ulcer to her left heel following a stay at a rehab facility.  Family has been trying to keep the wound clean with dressings and elevation, however home health nurse had removed a scab earlier today and noticed what seemed like significant infection underneath.  There was reportedly significant drainage with foul odor.  Patient denies any pain or redness from the area, has not had any fevers.  She does state that she has been dealing with pain in both knees for the past couple of months after a fall, has not been ambulatory since previous rehab stay.        Past Medical History:  Diagnosis Date   Chronic diastolic CHF (congestive heart failure), NYHA class 3 ([place]) 06/30/2019   Diabetes mellitus without complication ([place])    Humerus fracture 04/20/2018   left   Hyperlipidemia    Hypertension    Hypothyroidism    Vertigo    several yrs ago   Wears dentures    partial upper and lower (loose)    Patient Active Problem List   Diagnosis Date Noted   Atherosclerosis of native arteries of the extremities with ulceration ([place]) 08/01/2020   Atherosclerosis of native arteries of extremity with intermittent claudication ([place]) 06/30/2020   Diabetic foot infection_right second toe  05/17/2020   CKD (chronic kidney disease), stage IV ([place]) 05/17/2020   Type II diabetes mellitus with renal  manifestations ([place]) 05/17/2020   GERD (gastroesophageal reflux disease) 05/17/2020   AKI (acute kidney injury) ([place]) 11/15/2019   Physical deconditioning 11/15/2019   Anemia in chronic kidney disease 10/18/2019   Benign hypertensive kidney disease with chronic kidney disease 10/18/2019   Congestive heart failure ([place]) 10/18/2019   Aspiration pneumonia ([place]) 10/10/2019   Hyperkalemia    Rib pain on left side    Leukocytosis 10/04/2019   Fall at home, initial encounter 10/04/2019   Cellulitis of right leg 10/04/2019   CKD stage 4 due to type 2 diabetes mellitus ([place]) 10/04/2019   Acute kidney injury superimposed on CKD ([place])    RUQ pain    Hyperlipidemia    Right leg pain 10/03/2019   Diabetic ulcer of right foot associated with type 2 diabetes mellitus, with fat layer exposed ([place]) 10/03/2019   Cellulitis of right foot 10/03/2019   HTN (hypertension) 10/03/2019   Type 2 diabetes mellitus with stage 3 chronic kidney disease ([place]) 10/03/2019   Hypothyroidism (acquired) 10/03/2019   UTI (urinary tract infection) 10/03/2019   Chronic diastolic CHF (congestive heart failure), NYHA class 3 ([place]) 06/30/2019   Traumatic closed fx of proximal humerus with minimal displacement, left, initial encounter 06/10/2018   Sepsis ([place]) 04/25/2018   Chest pain with high risk for cardiac etiology 06/09/2015   SOB (shortness of breath) on exertion 06/09/2015   Proteinuria 05/16/2014   Cataract 05/20/2013   Charcot foot due to diabetes mellitus ([place]) 05/20/2013   Obesity 05/20/2013   Type 2 diabetes  mellitus with diabetic neuropathic arthropathy ([place]) 05/20/2013    Past Surgical History:  Procedure Laterality Date   AMPUTATION TOE Right 05/31/2020   Procedure: AMPUTATION TOE;  Surgeon: Caroline More, DPM;  Location: [facility];  Service: Podiatry;  Laterality: Right;   CATARACT EXTRACTION W/PHACO Left 12/01/2018   Procedure: CATARACT EXTRACTION PHACO AND INTRAOCULAR LENS PLACEMENT ([place]) LEFT DIABETIC;   Surgeon: Birder Robson, MD;  Location: [place];  Service: Ophthalmology;  Laterality: Left;  Diabetic - insulin   COLONOSCOPY WITH PROPOFOL N/A 07/07/2019   Procedure: COLONOSCOPY WITH PROPOFOL;  Surgeon: Robert Bellow, MD;  Location: [facility];  Service: Endoscopy;  Laterality: N/A;   ESOPHAGOGASTRODUODENOSCOPY (EGD) WITH PROPOFOL N/A 07/07/2019   Procedure: ESOPHAGOGASTRODUODENOSCOPY (EGD) WITH PROPOFOL;  Surgeon: Robert Bellow, MD;  Location: [facility];  Service: Endoscopy;  Laterality: N/A;   FOOT SURGERY Right    lesion excision   LOWER EXTREMITY ANGIOGRAPHY Right 05/19/2020   Procedure: Lower Extremity Angiography;  Surgeon: Katha Cabal, MD;  Location: [facility];  Service: Cardiovascular;  Laterality: Right;   LOWER EXTREMITY ANGIOGRAPHY Right 05/30/2020   Procedure: Lower Extremity Angiography;  Surgeon: Katha Cabal, MD;  Location: [place];  Service: Cardiovascular;  Laterality: Right;  Pedal Approach    Prior to Admission medications   Medication Sig Start Date End Date Taking? Authorizing Provider  amLODipine (NORVASC) 10 MG tablet Take 1 tablet by mouth daily. 05/02/16   [provider]  ASPIRIN 81 PO Take by mouth daily.    [provider]  atorvastatin (LIPITOR) 40 MG tablet Take 1 tablet by mouth at bedtime.  05/02/16   [provider]  bisacodyl (DULCOLAX) 10 MG suppository Place 1 suppository (10 mg total) rectally as needed for moderate constipation. 04/02/20   Merlyn Lot, MD  carvedilol (COREG) 25 MG tablet Take 1 tablet by mouth 2 (two) times daily. 05/02/16   [provider]  donepezil (ARICEPT) 10 MG tablet Take 10 mg by mouth at bedtime.  04/21/18   [provider]  gabapentin (NEURONTIN) 100 MG capsule Take 1 capsule (100 mg total) by mouth 2 (two) times daily as needed for up to 14 days. 06/02/20 06/16/20  Sidney Ace, MD  insulin aspart protamine-  aspart (NOVOLOG MIX 70/30) (70-30) 100 UNIT/ML injection Inject 15 Units into the skin 2 (two) times daily with a meal.    [provider]  polyethylene glycol (MIRALAX / GLYCOLAX) 17 g packet Take 17 g by mouth daily. Mix one tablespoon with 8oz of your favorite juice or water every day until you are having soft formed stools. Then start taking once daily if you didn't have a stool the day before. 04/02/20   Merlyn Lot, MD  SYNTHROID 50 MCG tablet Take 1 tablet by mouth daily. 04/21/18   [provider]  TRESIBA FLEXTOUCH 100 UNIT/ML SOPN FlexTouch Pen Inject 80 Units into the skin at bedtime.  04/21/18   [provider]    Allergies Patient has no known allergies.  Family History  Problem Relation Age of Onset   Breast cancer Neg Hx     Social History Social History   Tobacco Use   Smoking status: Never   Smokeless tobacco: Never  Vaping Use   Vaping Use: Never used  Substance Use Topics   Alcohol use: No   Drug use: Never    Review of Systems  Constitutional: No fever/chills Eyes: No visual changes. ENT: No sore throat.  Cardiovascular: Denies chest pain. Respiratory: Denies shortness of breath. Gastrointestinal: No abdominal pain.  No nausea, no vomiting.  No diarrhea.  No constipation. Genitourinary: Negative for dysuria. Musculoskeletal: Negative for back pain.  Positive for knee pain. Skin: Negative for rash.  Positive for foot wound. Neurological: Negative for headaches, focal weakness or numbness.  ____________________________________________   PHYSICAL EXAM:  VITAL SIGNS: ED Triage Vitals [03/20/21 1446]  Enc Vitals Group     BP 133/65     Pulse Rate 63     Resp 20     Temp 98 F (36.7 C)     Temp Source Oral     SpO2 95 %     Weight 200 lb (90.7 kg)     Height 5\' 7"  (1.702 m)     Head Circumference      Peak Flow      Pain Score 0     Pain Loc      Pain Edu?      Excl. in [place]?     Constitutional: Alert and  oriented. Eyes: Conjunctivae are normal. Head: Atraumatic. Nose: No congestion/rhinnorhea. Mouth/Throat: Mucous membranes are moist. Neck: Normal ROM Cardiovascular: Normal rate, regular rhythm. Grossly normal heart sounds.  2+ DP pulses bilaterally. Respiratory: Normal respiratory effort.  No retractions. Lungs CTAB. Gastrointestinal: Soft and nontender. No distention. Genitourinary: deferred Musculoskeletal: Large area of ulceration and necrosis to left heel with purulent foul-smelling drainage.  No erythema or warmth noted.  Diffuse tenderness to palpation of bilateral knees with no obvious deformity. Neurologic:  Normal speech and language. No gross focal neurologic deficits are appreciated. Skin:  Skin is warm, dry and intact. No rash noted. Psychiatric: Mood and affect are normal. Speech and behavior are normal.  ____________________________________________   LABS (all labs ordered are listed, but only abnormal results are displayed)  Labs Reviewed  COMPREHENSIVE METABOLIC PANEL - Abnormal; Notable for the following components:      Result Value   CO2 21 (*)    Glucose, Bld 141 (*)    BUN 33 (*)    Creatinine, Ser 1.67 (*)    Total Protein 8.5 (*)    Albumin 3.3 (*)    GFR, Estimated 32 (*)    All other components within normal limits  CBC WITH DIFFERENTIAL/PLATELET - Abnormal; Notable for the following components:   RBC 3.65 (*)    Hemoglobin 10.8 (*)    HCT 34.0 (*)    All other components within normal limits  RESP PANEL BY RT-PCR (FLU A&B, COVID) ARPGX2  LACTIC ACID, PLASMA  URINALYSIS, ROUTINE W REFLEX MICROSCOPIC    PROCEDURES  Procedure(s) performed (including Critical Care):  Procedures   ____________________________________________   INITIAL IMPRESSION / ASSESSMENT AND PLAN / ED COURSE      75 year old female with past medical history of hypertension, hyperlipidemia, diabetes, CKD, hypothyroidism, and gout who presents to the ED complaining of  worsening area of ulceration to her left heel with foul-smelling drainage noticed by home health nurse.  Patient does appear to have a large diabetic foot ulceration to her left heel with associated infection, we will further assess with x-ray for evidence of osteomyelitis.  We will start broad-spectrum antibiotics but vital signs are reassuring and not consistent with sepsis.  Patient complains of chronic pain to both knees after fall multiple months ago, x-rays reviewed by me with no fracture or dislocation, she does appear to have significant osteoarthritis of both knees.  X-rays of left heel reviewed by me  with no obvious signs of osteomyelitis or other acute process.  Cannot completely exclude osteomyelitis based off of x-ray and patient would benefit from admission for broad-spectrum antibiotics along with wound care.       ____________________________________________   FINAL CLINICAL IMPRESSION(S) / ED DIAGNOSES  Final diagnoses:  Diabetic foot infection ([place])  Heel ulceration, left, with unspecified severity [facility])     ED Discharge Orders     None        Note:  This document was prepared using Dragon voice recognition software and may include unintentional dictation errors.    Blake Divine, MD 03/20/21 1730

## 2021-03-20 NOTE — ED Notes (Signed)
Informed RN bed assigned 

## 2021-03-20 NOTE — ED Triage Notes (Signed)
Left foot pain for the last few months, VS 100% RA 66HR, 160/69, resp 16. No known injury. Pain is 4/10. Pt is bed bound and is from home.

## 2021-03-21 ENCOUNTER — Encounter
Admission: EM | Disposition: A | Discharge status: Home Health | Payer: Self-pay | Source: Home / Self Care | Attending: Internal Medicine

## 2021-03-21 ENCOUNTER — Other Ambulatory Visit (INDEPENDENT_AMBULATORY_CARE_PROVIDER_SITE_OTHER): Payer: Self-pay | Admitting: Vascular Surgery

## 2021-03-21 DIAGNOSIS — I70268 Atherosclerosis of native arteries of extremities with gangrene, other extremity: Secondary | ICD-10-CM | POA: Diagnosis present

## 2021-03-21 DIAGNOSIS — M86172 Other acute osteomyelitis, left ankle and foot: Secondary | ICD-10-CM | POA: Diagnosis present

## 2021-03-21 DIAGNOSIS — L89620 Pressure ulcer of left heel, unstageable: Secondary | ICD-10-CM | POA: Diagnosis not present

## 2021-03-21 DIAGNOSIS — E13621 Other specified diabetes mellitus with foot ulcer: Secondary | ICD-10-CM | POA: Diagnosis not present

## 2021-03-21 DIAGNOSIS — G253 Myoclonus: Secondary | ICD-10-CM | POA: Diagnosis not present

## 2021-03-21 DIAGNOSIS — E11621 Type 2 diabetes mellitus with foot ulcer: Secondary | ICD-10-CM | POA: Diagnosis present

## 2021-03-21 DIAGNOSIS — E039 Hypothyroidism, unspecified: Secondary | ICD-10-CM | POA: Diagnosis present

## 2021-03-21 DIAGNOSIS — I130 Hypertensive heart and chronic kidney disease with heart failure and stage 1 through stage 4 chronic kidney disease, or unspecified chronic kidney disease: Secondary | ICD-10-CM | POA: Diagnosis present

## 2021-03-21 DIAGNOSIS — E11610 Type 2 diabetes mellitus with diabetic neuropathic arthropathy: Secondary | ICD-10-CM | POA: Diagnosis present

## 2021-03-21 DIAGNOSIS — G928 Other toxic encephalopathy: Secondary | ICD-10-CM | POA: Diagnosis not present

## 2021-03-21 DIAGNOSIS — L97509 Non-pressure chronic ulcer of other part of unspecified foot with unspecified severity: Secondary | ICD-10-CM

## 2021-03-21 DIAGNOSIS — F03A0 Unspecified dementia, mild, without behavioral disturbance, psychotic disturbance, mood disturbance, and anxiety: Secondary | ICD-10-CM | POA: Diagnosis present

## 2021-03-21 DIAGNOSIS — Z6831 Body mass index (BMI) 31.0-31.9, adult: Secondary | ICD-10-CM | POA: Diagnosis not present

## 2021-03-21 DIAGNOSIS — L97429 Non-pressure chronic ulcer of left heel and midfoot with unspecified severity: Secondary | ICD-10-CM | POA: Diagnosis not present

## 2021-03-21 DIAGNOSIS — I5032 Chronic diastolic (congestive) heart failure: Secondary | ICD-10-CM | POA: Diagnosis present

## 2021-03-21 DIAGNOSIS — E1142 Type 2 diabetes mellitus with diabetic polyneuropathy: Secondary | ICD-10-CM | POA: Diagnosis present

## 2021-03-21 DIAGNOSIS — E872 Acidosis, unspecified: Secondary | ICD-10-CM | POA: Diagnosis not present

## 2021-03-21 DIAGNOSIS — E1152 Type 2 diabetes mellitus with diabetic peripheral angiopathy with gangrene: Secondary | ICD-10-CM | POA: Diagnosis present

## 2021-03-21 DIAGNOSIS — E1169 Type 2 diabetes mellitus with other specified complication: Secondary | ICD-10-CM | POA: Diagnosis present

## 2021-03-21 DIAGNOSIS — I70244 Atherosclerosis of native arteries of left leg with ulceration of heel and midfoot: Secondary | ICD-10-CM | POA: Diagnosis not present

## 2021-03-21 DIAGNOSIS — L89153 Pressure ulcer of sacral region, stage 3: Secondary | ICD-10-CM | POA: Diagnosis present

## 2021-03-21 DIAGNOSIS — L97428 Non-pressure chronic ulcer of left heel and midfoot with other specified severity: Secondary | ICD-10-CM | POA: Diagnosis present

## 2021-03-21 DIAGNOSIS — M86672 Other chronic osteomyelitis, left ankle and foot: Secondary | ICD-10-CM | POA: Diagnosis present

## 2021-03-21 DIAGNOSIS — R569 Unspecified convulsions: Secondary | ICD-10-CM | POA: Diagnosis not present

## 2021-03-21 DIAGNOSIS — E1151 Type 2 diabetes mellitus with diabetic peripheral angiopathy without gangrene: Secondary | ICD-10-CM | POA: Diagnosis present

## 2021-03-21 DIAGNOSIS — Z20822 Contact with and (suspected) exposure to covid-19: Secondary | ICD-10-CM | POA: Diagnosis present

## 2021-03-21 DIAGNOSIS — D631 Anemia in chronic kidney disease: Secondary | ICD-10-CM | POA: Diagnosis present

## 2021-03-21 DIAGNOSIS — N179 Acute kidney failure, unspecified: Secondary | ICD-10-CM | POA: Diagnosis not present

## 2021-03-21 DIAGNOSIS — G9341 Metabolic encephalopathy: Secondary | ICD-10-CM | POA: Diagnosis not present

## 2021-03-21 DIAGNOSIS — E440 Moderate protein-calorie malnutrition: Secondary | ICD-10-CM | POA: Diagnosis present

## 2021-03-21 DIAGNOSIS — R9401 Abnormal electroencephalogram [EEG]: Secondary | ICD-10-CM | POA: Diagnosis not present

## 2021-03-21 DIAGNOSIS — I471 Supraventricular tachycardia: Secondary | ICD-10-CM | POA: Diagnosis not present

## 2021-03-21 DIAGNOSIS — B999 Unspecified infectious disease: Secondary | ICD-10-CM | POA: Diagnosis present

## 2021-03-21 DIAGNOSIS — N184 Chronic kidney disease, stage 4 (severe): Secondary | ICD-10-CM | POA: Diagnosis present

## 2021-03-21 HISTORY — PX: LOWER EXTREMITY ANGIOGRAPHY: CATH118251

## 2021-03-21 LAB — CBC
HCT: 29.9 % — ABNORMAL LOW (ref 36.0–46.0)
Hemoglobin: 9.7 g/dL — ABNORMAL LOW (ref 12.0–15.0)
MCH: 29.5 pg (ref 26.0–34.0)
MCHC: 32.4 g/dL (ref 30.0–36.0)
MCV: 90.9 fL (ref 80.0–100.0)
Platelets: 216 10*3/uL (ref 150–400)
RBC: 3.29 MIL/uL — ABNORMAL LOW (ref 3.87–5.11)
RDW: 13.4 % (ref 11.5–15.5)
WBC: 8.2 10*3/uL (ref 4.0–10.5)
nRBC: 0 % (ref 0.0–0.2)

## 2021-03-21 LAB — GLUCOSE, CAPILLARY
Glucose-Capillary: 101 mg/dL — ABNORMAL HIGH (ref 70–99)
Glucose-Capillary: 134 mg/dL — ABNORMAL HIGH (ref 70–99)
Glucose-Capillary: 108 mg/dL — ABNORMAL HIGH (ref 70–99)
Glucose-Capillary: 118 mg/dL — ABNORMAL HIGH (ref 70–99)
Glucose-Capillary: 109 mg/dL — ABNORMAL HIGH (ref 70–99)
Glucose-Capillary: 189 mg/dL — ABNORMAL HIGH (ref 70–99)

## 2021-03-21 LAB — HEMOGLOBIN A1C
Hgb A1c MFr Bld: 6.7 % — ABNORMAL HIGH (ref 4.8–5.6)
Mean Plasma Glucose: 146 mg/dL

## 2021-03-21 LAB — BASIC METABOLIC PANEL
Anion gap: 5 (ref 5–15)
BUN: 34 mg/dL — ABNORMAL HIGH (ref 8–23)
CO2: 22 mmol/L (ref 22–32)
Calcium: 8.7 mg/dL — ABNORMAL LOW (ref 8.9–10.3)
Chloride: 109 mmol/L (ref 98–111)
Creatinine, Ser: 1.51 mg/dL — ABNORMAL HIGH (ref 0.44–1.00)
GFR, Estimated: 36 mL/min — ABNORMAL LOW (ref >60)
Glucose, Bld: 148 mg/dL — ABNORMAL HIGH (ref 70–99)
Potassium: 4.1 mmol/L (ref 3.5–5.1)
Sodium: 136 mmol/L (ref 135–145)

## 2021-03-21 LAB — MRSA NEXT GEN BY PCR, NASAL: MRSA by PCR Next Gen: DETECTED — AB

## 2021-03-21 SURGERY — LOWER EXTREMITY ANGIOGRAPHY
Anesthesia: Moderate Sedation | Laterality: Left

## 2021-03-21 MED ORDER — IODIXANOL 320 MG/ML IV SOLN
INTRAVENOUS | Status: DC | PRN
  Administered 2021-03-21: 17:00:00 50 mL

## 2021-03-21 MED ORDER — CHLORHEXIDINE GLUCONATE CLOTH 2 % EX PADS
6.0000 | MEDICATED_PAD | Freq: Every day | CUTANEOUS | Status: AC
  Administered 2021-03-21 – 2021-03-25 (×5): 6 via TOPICAL

## 2021-03-21 MED ORDER — ENOXAPARIN SODIUM 60 MG/0.6ML IJ SOSY
0.5000 mg/kg | PREFILLED_SYRINGE | INTRAMUSCULAR | Status: DC
  Administered 2021-03-22 – 2021-03-25 (×5): 45 mg via SUBCUTANEOUS
  Filled 2021-03-21 (×5): qty 0.6

## 2021-03-21 MED ORDER — FENTANYL CITRATE (PF) 100 MCG/2ML IJ SOLN
INTRAMUSCULAR | Status: AC
  Filled 2021-03-21: qty 2

## 2021-03-21 MED ORDER — DIPHENHYDRAMINE HCL 50 MG/ML IJ SOLN: 50.0000 mg | Freq: Once | INTRAMUSCULAR | Status: DC | PRN

## 2021-03-21 MED ORDER — VANCOMYCIN HCL IN DEXTROSE 750-5 MG/150ML-% IV SOLN
750.0000 mg | INTRAVENOUS | Status: DC
  Administered 2021-03-21 – 2021-03-24 (×4): 750 mg via INTRAVENOUS
  Filled 2021-03-21 (×5): qty 150

## 2021-03-21 MED ORDER — HEPARIN SODIUM (PORCINE) 1000 UNIT/ML IJ SOLN
INTRAMUSCULAR | Status: AC
  Filled 2021-03-21: qty 10

## 2021-03-21 MED ORDER — CEFAZOLIN SODIUM-DEXTROSE 1-4 GM/50ML-% IV SOLN
INTRAVENOUS | Status: DC | PRN
  Administered 2021-03-21: 1 g via INTRAVENOUS

## 2021-03-21 MED ORDER — MUPIROCIN 2 % EX OINT
1.0000 "application " | TOPICAL_OINTMENT | Freq: Two times a day (BID) | CUTANEOUS | Status: AC
  Administered 2021-03-22 – 2021-03-25 (×7): 1 via NASAL
  Filled 2021-03-21: qty 22

## 2021-03-21 MED ORDER — HEPARIN SODIUM (PORCINE) 1000 UNIT/ML IJ SOLN
INTRAMUSCULAR | Status: DC | PRN
  Administered 2021-03-21: 4000 [IU] via INTRAVENOUS

## 2021-03-21 MED ORDER — MIDAZOLAM HCL 2 MG/2ML IJ SOLN
INTRAMUSCULAR | Status: DC | PRN
  Administered 2021-03-21: 1 mg via INTRAVENOUS
  Administered 2021-03-21: .5 mg via INTRAVENOUS

## 2021-03-21 MED ORDER — METHYLPREDNISOLONE SODIUM SUCC 125 MG IJ SOLR: 125.0000 mg | Freq: Once | INTRAMUSCULAR | Status: DC | PRN

## 2021-03-21 MED ORDER — CEFAZOLIN SODIUM-DEXTROSE 1-4 GM/50ML-% IV SOLN: 1.0000 g | INTRAVENOUS | Status: DC

## 2021-03-21 MED ORDER — FAMOTIDINE 20 MG PO TABS: 40.0000 mg | ORAL_TABLET | Freq: Once | ORAL | Status: DC | PRN

## 2021-03-21 MED ORDER — VANCOMYCIN HCL 750 MG/150ML IV SOLN
750.0000 mg | INTRAVENOUS | Status: DC
  Filled 2021-03-21: qty 150

## 2021-03-21 MED ORDER — SODIUM CHLORIDE 0.9 % IV SOLN: INTRAVENOUS | Status: DC

## 2021-03-21 MED ORDER — ONDANSETRON HCL 4 MG/2ML IJ SOLN
4.0000 mg | Freq: Four times a day (QID) | INTRAMUSCULAR | Status: DC | PRN
  Administered 2021-03-23 – 2021-03-26 (×2): 4 mg via INTRAVENOUS
  Filled 2021-03-21 (×2): qty 2

## 2021-03-21 MED ORDER — MIDAZOLAM HCL 2 MG/ML PO SYRP: 8.0000 mg | ORAL_SOLUTION | Freq: Once | ORAL | Status: DC | PRN

## 2021-03-21 MED ORDER — FENTANYL CITRATE (PF) 100 MCG/2ML IJ SOLN
INTRAMUSCULAR | Status: DC | PRN
  Administered 2021-03-21: 25 ug via INTRAVENOUS
  Administered 2021-03-21: 50 ug via INTRAVENOUS

## 2021-03-21 MED ORDER — CEFEPIME HCL 2 G IJ SOLR
2.0000 g | Freq: Two times a day (BID) | INTRAMUSCULAR | Status: DC
  Administered 2021-03-21 – 2021-03-25 (×9): 2 g via INTRAVENOUS
  Filled 2021-03-21 (×10): qty 2

## 2021-03-21 MED ORDER — MIDAZOLAM HCL 5 MG/5ML IJ SOLN
INTRAMUSCULAR | Status: AC
  Filled 2021-03-21: qty 5

## 2021-03-21 MED ORDER — HYDROMORPHONE HCL 1 MG/ML IJ SOLN: 1.0000 mg | Freq: Once | INTRAMUSCULAR | Status: DC | PRN

## 2021-03-21 SURGICAL SUPPLY — 20 items
BALLN ARMADA 2X200X150 (BALLOONS) ×3
BALLN ULTRVRSE 2.5X300X150 (BALLOONS) ×3
BALLOON ARMADA 2X200X150 (BALLOONS) IMPLANT
BALLOON ULTRVRSE 2.5X300X150 (BALLOONS) IMPLANT
CATH 0.018 NAVICROSS ANG 135 (CATHETERS) ×2 IMPLANT
CATH ANGIO 5F PIGTAIL 65CM (CATHETERS) ×2 IMPLANT
CATH BEACON 5 .038 100 VERT TP (CATHETERS) ×2 IMPLANT
CATH NAVICROSS ANGLED 135CM (MICROCATHETER) ×2 IMPLANT
COVER PROBE U/S 5X48 (MISCELLANEOUS) ×2 IMPLANT
DEVICE STARCLOSE SE CLOSURE (Vascular Products) ×2 IMPLANT
GLIDEWIRE ADV .035X260CM (WIRE) ×2 IMPLANT
GUIDEWIRE PFTE-COATED .018X300 (WIRE) ×2 IMPLANT
KIT ENCORE 26 ADVANTAGE (KITS) ×2 IMPLANT
PACK ANGIOGRAPHY (CUSTOM PROCEDURE TRAY) ×3 IMPLANT
SHEATH PINNACLE 5F 10CM (SHEATH) ×2 IMPLANT
SHEATH RAABE 6FRX70 (SHEATH) ×2 IMPLANT
SYR MEDRAD MARK 7 150ML (SYRINGE) ×2 IMPLANT
TUBING CONTRAST HIGH PRESS 72 (TUBING) ×2 IMPLANT
WIRE G V18X300CM (WIRE) ×2 IMPLANT
WIRE GUIDERIGHT .035X150 (WIRE) ×2 IMPLANT

## 2021-03-21 NOTE — Progress Notes (Signed)
Pharmacy Antibiotic Note  [name] [name] is a 75 y.o. female admitted on 03/20/2021 with  DM foot infection/Osteomyelitis .  Pharmacy has been consulted for Vancomycin and Cefepime dosing.  -Patient will need surgical OR debridement and likely bone biopsy after MRI and vascular consultation.  Plan: Patient received Vancomycin 1 gram 12/13 x 1 dose in ED and Cefepime 2 gm x 1 dose in ED  -Cefepime 2 gm IV q12h  Crcl 37.8 mlmin  -Vancomycin 750 mg IV Q 24 hrs. Goal AUC 400-550. Expected AUC: 537 SCr used: 1.51 Cmin 15.6  Vd= 0.5   BMI 31  F/u Scr.  Will start maintenance Vancomycin dose earlier as patient was not fully loaded in ED    Height: 5\' 7"  (170.2 cm) Weight: 90.7 kg (200 lb) IBW/kg (Calculated) : 61.6  Temp (24hrs), Avg:98 F (36.7 C), Min:97.7 F (36.5 C), Max:98.4 F (36.9 C)  Recent Labs  Lab 03/20/21 1446 03/21/21 0142  WBC 9.1 8.2  CREATININE 1.67* 1.51*  LATICACIDVEN 1.2  --     Estimated Creatinine Clearance: 37.8 mL/min (A) (by C-G formula based on SCr of 1.51 mg/dL (H)).    No Known Allergies  Antimicrobials this admission: Vancomycin 12/13 >> Cefepime 12/13 >>  Dose adjustments this admission:    Microbiology results:   BCx:     UCx:      Sputum:    12/14 MRSA PCR: +  Thank you for allowing pharmacy to be a part of this patients care.  Danette Weinfeld A 03/21/2021 10:11 AM

## 2021-03-21 NOTE — H&P (View-Only) (Signed)
[facility] Vascular Consult Note  MRN : [id number]  [name] [name] is a 75 y.o. (06/12/1945) female who presents with chief complaint of  Chief Complaint  Patient presents with   Wound Infection   History of Present Illness:  [name] [name] is a 75 y.o. female with a PMH significant for Type II DM, HTN, CKD 4, hypothyroidism, H LD, GERD, Charcot foot, anemia.  She presented from home where she lives with her son to the ED on 03/20/2021 with left heel ulcer x several weeks.  At baseline, patient does not walk or perform any of her own ADLs.  They have a nursing assistant to provide her care. The nursing assistant noticed her foot wound today was draining purulent material.  In the ED, it was found that they had purulent foot ulcer with no signs of osteomyelitis on x-ray.  Podiatry will be taken to the operating room tomorrow for debridement.  As per the recommendation we were consulted by Dr. Sloan Leiter for possible endovascular intervention.  Current Facility-Administered Medications  Medication Dose Route Frequency Provider Last Rate Last Admin   acetaminophen (TYLENOL) tablet 650 mg  650 mg Oral Q6H PRN Richarda Osmond, MD       Or   acetaminophen (TYLENOL) suppository 650 mg  650 mg Rectal Q6H PRN Richarda Osmond, MD       amLODipine (NORVASC) tablet 10 mg  10 mg Oral Daily Richarda Osmond, MD   10 mg at 03/21/21 0946   aspirin chewable tablet 81 mg  81 mg Oral Daily Richarda Osmond, MD   81 mg at 03/21/21 0946   atorvastatin (LIPITOR) tablet 40 mg  40 mg Oral Daily Richarda Osmond, MD   40 mg at 03/21/21 0946   bisacodyl (DULCOLAX) suppository 10 mg  10 mg Rectal PRN Richarda Osmond, MD       carvedilol (COREG) tablet 25 mg  25 mg Oral BID Richarda Osmond, MD   25 mg at 03/21/21 0951   ceFEPIme (MAXIPIME) 2 g in sodium chloride 0.9 % 100 mL IVPB  2 g Intravenous Q12H Barb Merino, MD       Chlorhexidine Gluconate Cloth 2 % PADS 6  each  6 each Topical Q0600 Barb Merino, MD       donepezil (ARICEPT) tablet 10 mg  10 mg Oral QHS Doristine Mango L, MD   10 mg at 03/20/21 2352   enoxaparin (LOVENOX) injection 45 mg  0.5 mg/kg Subcutaneous Q24H Chinita Greenland A, RPH       insulin aspart (novoLOG) injection 0-9 Units  0-9 Units Subcutaneous TID WC Richarda Osmond, MD       insulin glargine-yfgn (SEMGLEE) injection 15 Units  15 Units Subcutaneous Daily Richarda Osmond, MD   15 Units at 03/21/21 0946   levothyroxine (SYNTHROID) tablet 50 mcg  50 mcg Oral Daily Richarda Osmond, MD   50 mcg at 03/21/21 0606   mupirocin ointment (BACTROBAN) 2 % 1 application  1 application Nasal BID Barb Merino, MD       polyethylene glycol (MIRALAX / GLYCOLAX) packet 17 g  17 g Oral Daily Richarda Osmond, MD       vancomycin (VANCOCIN) IVPB 750 mg/150 ml premix  750 mg Intravenous Q24H Barb Merino, MD       Past Medical History:  Diagnosis Date   Chronic diastolic CHF (congestive heart failure), NYHA class 3 ([place]) 06/30/2019   Diabetes mellitus  without complication ([place])    Humerus fracture 04/20/2018   left   Hyperlipidemia    Hypertension    Hypothyroidism    Vertigo    several yrs ago   Wears dentures    partial upper and lower (loose)   Past Surgical History:  Procedure Laterality Date   AMPUTATION TOE Right 05/31/2020   Procedure: AMPUTATION TOE;  Surgeon: Caroline More, DPM;  Location: [facility];  Service: Podiatry;  Laterality: Right;   CATARACT EXTRACTION W/PHACO Left 12/01/2018   Procedure: CATARACT EXTRACTION PHACO AND INTRAOCULAR LENS PLACEMENT ([place]) LEFT DIABETIC;  Surgeon: Birder Robson, MD;  Location: [facility];  Service: Ophthalmology;  Laterality: Left;  Diabetic - insulin   COLONOSCOPY WITH PROPOFOL N/A 07/07/2019   Procedure: COLONOSCOPY WITH PROPOFOL;  Surgeon: Robert Bellow, MD;  Location: [facility];  Service: Endoscopy;  Laterality: N/A;   ESOPHAGOGASTRODUODENOSCOPY  (EGD) WITH PROPOFOL N/A 07/07/2019   Procedure: ESOPHAGOGASTRODUODENOSCOPY (EGD) WITH PROPOFOL;  Surgeon: Robert Bellow, MD;  Location: [facility];  Service: Endoscopy;  Laterality: N/A;   FOOT SURGERY Right    lesion excision   LOWER EXTREMITY ANGIOGRAPHY Right 05/19/2020   Procedure: Lower Extremity Angiography;  Surgeon: Katha Cabal, MD;  Location: [facility];  Service: Cardiovascular;  Laterality: Right;   LOWER EXTREMITY ANGIOGRAPHY Right 05/30/2020   Procedure: Lower Extremity Angiography;  Surgeon: Katha Cabal, MD;  Location: [facility];  Service: Cardiovascular;  Laterality: Right;  Pedal Approach   Social History Social History   Tobacco Use   Smoking status: Never   Smokeless tobacco: Never  Vaping Use   Vaping Use: Never used  Substance Use Topics   Alcohol use: No   Drug use: Never   Family History Family History  Problem Relation Age of Onset   Breast cancer Neg Hx   Denies family history of peripheral artery disease, venous disease or renal disease.  No Known Allergies  REVIEW OF SYSTEMS (Negative unless checked)  Constitutional: [] Weight loss  [] Fever  [] Chills Cardiac: [] Chest pain   [] Chest pressure   [] Palpitations   [] Shortness of breath when laying flat   [] Shortness of breath at rest   [] Shortness of breath with exertion. Vascular:  [] Pain in legs with walking   [] Pain in legs at rest   [] Pain in legs when laying flat   [] Claudication   [] Pain in feet when walking  [] Pain in feet at rest  [] Pain in feet when laying flat   [] History of DVT   [] Phlebitis   [x] Swelling in legs   [] Varicose veins   [x] Non-healing ulcers Pulmonary:   [] Uses home oxygen   [] Productive cough   [] Hemoptysis   [] Wheeze  [] COPD   [] Asthma Neurologic:  [] Dizziness  [] Blackouts   [] Seizures   [] History of stroke   [] History of TIA  [] Aphasia   [] Temporary blindness   [] Dysphagia   [] Weakness or numbness in arms   [] Weakness or numbness in  legs Musculoskeletal:  [] Arthritis   [] Joint swelling   [] Joint pain   [] Low back pain Hematologic:  [] Easy bruising  [] Easy bleeding   [] Hypercoagulable state   [x] Anemic  [] Hepatitis Gastrointestinal:  [] Blood in stool   [] Vomiting blood  [] Gastroesophageal reflux/heartburn   [] Difficulty swallowing. Genitourinary:  [] Chronic kidney disease   [] Difficult urination  [] Frequent urination  [] Burning with urination   [] Blood in urine Skin:  [] Rashes   [] Ulcers   [] Wounds Psychological:  [] History of anxiety   []  History of major  depression.  Physical Examination  Vitals:   03/20/21 1937 03/21/21 0634 03/21/21 0801 03/21/21 1117  BP: (!) 142/77 (!) 128/94 (!) 125/46 135/64  Pulse: 76 (!) 55 (!) 58 60  Resp: 14 16 18 18   Temp: 98.4 F (36.9 C) 97.7 F (36.5 C) 98.2 F (36.8 C) 98.4 F (36.9 C)  TempSrc:      SpO2: 99% (!) 85% 99% 100%  Weight:      Height:       Body mass index is 31.32 kg/m. Gen:  WD/WN, NAD Head: [place]/AT, No temporalis wasting. Prominent temp pulse not noted. Ear/Nose/Throat: Hearing grossly intact, nares w/o erythema or drainage, oropharynx w/o Erythema/Exudate Eyes: Sclera non-icteric, conjunctiva clear Neck: Trachea midline.  No JVD.  Pulmonary:  Good air movement, respirations not labored, equal bilaterally.  Cardiac: RRR, normal S1, S2. Vascular:  Vessel Right Left  Radial Palpable Palpable  Ulnar Palpable Palpable  Brachial Palpable Palpable  Carotid Palpable, without bruit Palpable, without bruit  Aorta Not palpable N/A  Femoral Palpable Palpable  Popliteal Palpable Palpable  PT Non-Palpable Non-Palpable  DP Non-Palpable Non-Palpable   Left lower extremity: Thigh soft.  Calf soft.  Mild to moderate edema.  Heel ulceration with necrotic changes noted.  Unable to palpate pedal pulses.  Gastrointestinal: soft, non-tender/non-distended. No guarding/reflex.  Musculoskeletal: M/S 5/5 throughout.  Extremities without ischemic changes.  No deformity or  atrophy. No edema. Neurologic: Sensation grossly intact in extremities.  Symmetrical.  Speech is fluent. Motor exam as listed above. Psychiatric: Judgment intact, Mood & affect appropriate for pt's clinical situation. Dermatologic: As above  Lymph : No Cervical, Axillary, or Inguinal lymphadenopathy.  CBC Lab Results  Component Value Date   WBC 8.2 03/21/2021   HGB 9.7 (L) 03/21/2021   HCT 29.9 (L) 03/21/2021   MCV 90.9 03/21/2021   PLT 216 03/21/2021   BMET    Component Value Date/Time   NA 136 03/21/2021 0142   NA 142 04/17/2014 1131   K 4.1 03/21/2021 0142   K 4.0 04/17/2014 1131   CL 109 03/21/2021 0142   CL 109 (H) 04/17/2014 1131   CO2 22 03/21/2021 0142   CO2 24 04/17/2014 1131   GLUCOSE 148 (H) 03/21/2021 0142   GLUCOSE 89 04/17/2014 1131   BUN 34 (H) 03/21/2021 0142   BUN 22 (H) 04/17/2014 1131   CREATININE 1.51 (H) 03/21/2021 0142   CREATININE 1.78 (H) 04/17/2014 1131   CALCIUM 8.7 (L) 03/21/2021 0142   CALCIUM 8.8 04/17/2014 1131   GFRNONAA 36 (L) 03/21/2021 0142   GFRNONAA 30 (L) 04/17/2014 1131   GFRNONAA 37 (L) 04/22/2013 0946   GFRAA 29 (L) 12/07/2019 0645   GFRAA 37 (L) 04/17/2014 1131   GFRAA 42 (L) 04/22/2013 0946   Estimated Creatinine Clearance: 37.8 mL/min (A) (by C-G formula based on SCr of 1.51 mg/dL (H)).  COAG Lab Results  Component Value Date   INR 1.2 10/11/2019   INR 1.2 10/10/2019   Radiology DG Knee 2 Views Left  Result Date: 03/20/2021 CLINICAL DATA:  Bilateral knee pain and swelling, fell EXAM: RIGHT KNEE - 1-2 VIEW; LEFT KNEE - 1-2 VIEW COMPARISON:  03/07/2015 FINDINGS: Left knee: Frontal and lateral views are obtained. The bones are severely osteopenic. No acute fracture, subluxation, or dislocation. There is severe 3 compartmental osteoarthritis greatest in the patellofemoral compartment. No joint effusion. Diffuse atherosclerosis. Right knee: Frontal and lateral views are obtained. Bones are severely osteopenic. No fracture,  subluxation, or dislocation. Severe 3 compartmental osteoarthritis  greatest in the medial and lateral compartments. Trace joint effusion. Diffuse atherosclerosis. IMPRESSION: 1. No acute displaced fractures. 2. Severe bilateral 3 compartmental osteoarthritis. 3. Severe osteopenia. Electronically Signed   By: Randa Ngo M.D.   On: 03/20/2021 16:16   DG Knee 2 Views Right  Result Date: 03/20/2021 CLINICAL DATA:  Bilateral knee pain and swelling, fell EXAM: RIGHT KNEE - 1-2 VIEW; LEFT KNEE - 1-2 VIEW COMPARISON:  03/07/2015 FINDINGS: Left knee: Frontal and lateral views are obtained. The bones are severely osteopenic. No acute fracture, subluxation, or dislocation. There is severe 3 compartmental osteoarthritis greatest in the patellofemoral compartment. No joint effusion. Diffuse atherosclerosis. Right knee: Frontal and lateral views are obtained. Bones are severely osteopenic. No fracture, subluxation, or dislocation. Severe 3 compartmental osteoarthritis greatest in the medial and lateral compartments. Trace joint effusion. Diffuse atherosclerosis. IMPRESSION: 1. No acute displaced fractures. 2. Severe bilateral 3 compartmental osteoarthritis. 3. Severe osteopenia. Electronically Signed   By: Randa Ngo M.D.   On: 03/20/2021 16:16   MR HEEL LEFT WO CONTRAST  Result Date: 03/20/2021 CLINICAL DATA:  Left heel ulceration for several weeks EXAM: MR OF THE LEFT HEEL WITHOUT CONTRAST TECHNIQUE: Multiplanar, multisequence MR imaging of the left calcaneus was performed. No intravenous contrast was administered. COMPARISON:  03/20/2021 FINDINGS: Bones/Joint/Cartilage There is abnormal T2 signal within the superficial aspect of the calcaneus, with overlying cortical irregularity, consistent with osteomyelitis. There is a large overlying soft tissue ulceration. Likely reactive marrow edema within the distal tibia, fibula, and talus consistent with degenerative change. Marked joint space narrowing throughout  the hindfoot and ankle. Trace tibiotalar joint effusion. Ligaments No acute abnormalities. Muscles and Tendons No acute abnormalities. Soft tissues Soft tissue ulceration overlying the calcaneus measuring approximately 1.6 x 2.0 cm in size, extending to the posterior calcaneal cortex. No fluid collection or abscess. There is diffuse subcutaneous edema, most pronounced along the dorsum of the midfoot. IMPRESSION: 1. Large soft tissue ulceration overlying the calcaneus, with underlying calcaneal cortical irregularity and marrow edema consistent with osteomyelitis. 2. Extensive degenerative changes throughout the ankle and hindfoot. 3. Diffuse soft tissue edema.  No fluid collection or abscess. Electronically Signed   By: Randa Ngo M.D.   On: 03/20/2021 23:31   DG Foot 2 Views Left  Result Date: 03/20/2021 CLINICAL DATA:  Open wound EXAM: LEFT FOOT - 2 VIEW COMPARISON:  None. FINDINGS: Ulcer noted overlying the calcaneus in the posterior heel soft tissues. No underlying bone destruction to suggest osteomyelitis. Diffuse osteopenia. No acute bony abnormality. Specifically, no fracture, subluxation, or dislocation. Calcification in the distal Achilles tendon region, likely related to old injury. Plantar calcaneal spur. Vascular calcifications noted. IMPRESSION: No acute bony abnormality. No evidence of osteomyelitis by plain films. Electronically Signed   By: Rolm Baptise M.D.   On: 03/20/2021 17:23    Assessment/Plan [name] [name] is a 75 y.o. female with a PMH significant for Type II DM, HTN, CKD 4, hypothyroidism, H LD, GERD, Charcot foot, anemia.  1.  Heel Ulceration: Patient presents with left heel ulceration.  This ulceration has been present for few weeks.  Recently started to drain as per the patient's nursing assistant.  She is bedbound with contracture.  Unable to palpate pedal pulses.  Known history of atherosclerotic disease.  No ABI ordered by podiatry.  They will be taken to the operating  room tomorrow and suggested vascular surgery consult to assess arterial patency.  Recommend undergoing a left lower extremity angiogram possible intervention and attempt to  assess patient's anatomy contributing.  Also carotid disease.  If appropriate an attempt revascularize leg can be made at that time.  Procedure, risk benefits were explained to the patient and her son at the bedside.  All questions were answered.  We will plan on this today with Dr. Lucky Cowboy.  2.  Chronic Kidney Disease: Will try to limit the amount of contrast used to minimize effects on the kidney. AM BMP.  3.  Diabetes Type 2: On appropriate medications. Encouraged good control as its slows the progression of atherosclerotic disease   Discussed with Dr. Mayme Genta, PA-C 03/21/2021 12:18 PM  This note was created with Dragon medical transcription system.  Any error is purely unintentional.

## 2021-03-21 NOTE — Progress Notes (Signed)
PROGRESS NOTE    [name] [name]  DUK:[id number] DOB: 04/17/1945 DOA: 03/20/2021 PCP: Marguerita Merles, MD    Brief Narrative:  75 year old with history of type 2 diabetes, hypertension, CKD stage IV, hypothyroidism, Charcot foot and peripheral arterial disease.  Presenting to the emergency room with left heel ulcer for several weeks.  Patient is mostly bedbound.  She uses wheelchair to get around.  Followed by podiatry as outpatient.  Admitted for surgical intervention.   Assessment & Plan:   Principal Problem:   Foot ulcer due to secondary DM ([place])  Diabetic left heel ulcer/peripheral arterial disease with history of angiogram and stenting/calcaneal osteomyelitis: Continue vancomycin and cefepime today.  Redosed with help of pharmacy. Seen by podiatry, plan for debridement 12/15. Discussed with vascular surgery for reevaluation, they will probably do angiogram today. PT OT and mobility.  Pain medications.  Type 2 diabetes: Well-controlled as per patient.  Significant peripheral arterial disease and neuropathy.  Currently on long-acting insulin, received in the morning.  Also on sliding scale insulin.  Essential hypertension/chronic diastolic dysfunction/CKD stage IV/hypothyroidism: Fairly stable at baseline.   DVT prophylaxis:   Lovenox   Code Status: Full code Family Communication: Son at the bedside Disposition Plan: Status is: Observation  The patient will require care spanning > 2 midnights and should be moved to inpatient because: Patient needing inpatient surgical procedure, debridement.  IV antibiotics.  Also needing inpatient vascular intervention.        Consultants:  Vascular surgery Podiatry  Procedures:  None  Antimicrobials:  Vancomycin and cefepime 12/13---   Subjective: Patient seen and examined.  Full of sense of humor.  Son arrived to the bedside.  She denies any complaints.  Poor historian overall.  Afebrile overnight.  Aware about upcoming  surgical procedure but she was not sure how much surgery on her foot is going to happen.  Objective: Vitals:   03/20/21 1937 03/21/21 0634 03/21/21 0801 03/21/21 1117  BP: (!) 142/77 (!) 128/94 (!) 125/46 135/64  Pulse: 76 (!) 55 (!) 58 60  Resp: 14 16 18 18   Temp: 98.4 F (36.9 C) 97.7 F (36.5 C) 98.2 F (36.8 C) 98.4 F (36.9 C)  TempSrc:      SpO2: 99% (!) 85% 99% 100%  Weight:      Height:        Intake/Output Summary (Last 24 hours) at 03/21/2021 1324 Last data filed at 03/20/2021 1912 Gross per 24 hour  Intake 294.98 ml  Output --  Net 294.98 ml   Filed Weights   03/20/21 1446  Weight: 90.7 kg    Examination:  General exam: Appears calm and comfortable  Debilitated and chronically sick looking but not in any distress.  Full of sense of humor. Respiratory system: Clear to auscultation. Respiratory effort normal.  No added sounds Cardiovascular system: S1 & S2 heard, RRR.  Gastrointestinal system: soft, non tender  Central nervous system: Alert and oriented. No focal neurological deficits. Extremities:  Decreased muscle tone.  Contractures of both lower extremities at ankles.  Chronic ischemic changes.  Right foot toe amputation stump clean. Left heel with necrotic wound, pictures in the chart.    Data Reviewed: I have personally reviewed following labs and imaging studies  CBC: Recent Labs  Lab 03/20/21 1446 03/21/21 0142  WBC 9.1 8.2  NEUTROABS 5.0  --   HGB 10.8* 9.7*  HCT 34.0* 29.9*  MCV 93.2 90.9  PLT 231 376   Basic Metabolic Panel: Recent Labs  Lab  03/20/21 1446 03/21/21 0142  NA 136 136  K 4.2 4.1  CL 109 109  CO2 21* 22  GLUCOSE 141* 148*  BUN 33* 34*  CREATININE 1.67* 1.51*  CALCIUM 9.1 8.7*   GFR: Estimated Creatinine Clearance: 37.8 mL/min (A) (by C-G formula based on SCr of 1.51 mg/dL (H)). Liver Function Tests: Recent Labs  Lab 03/20/21 1446  AST 20  ALT 9  ALKPHOS 87  BILITOT 0.7  PROT 8.5*  ALBUMIN 3.3*   No  results for input(s): LIPASE, AMYLASE in the last 168 hours. No results for input(s): AMMONIA in the last 168 hours. Coagulation Profile: No results for input(s): INR, PROTIME in the last 168 hours. Cardiac Enzymes: No results for input(s): CKTOTAL, CKMB, CKMBINDEX, TROPONINI in the last 168 hours. BNP (last 3 results) No results for input(s): PROBNP in the last 8760 hours. HbA1C: No results for input(s): HGBA1C in the last 72 hours. CBG: Recent Labs  Lab 03/20/21 2346 03/21/21 0726 03/21/21 1114  GLUCAP 163* 101* 134*   Lipid Profile: No results for input(s): CHOL, HDL, LDLCALC, TRIG, CHOLHDL, LDLDIRECT in the last 72 hours. Thyroid Function Tests: No results for input(s): TSH, T4TOTAL, FREET4, T3FREE, THYROIDAB in the last 72 hours. Anemia Panel: No results for input(s): VITAMINB12, FOLATE, FERRITIN, TIBC, IRON, RETICCTPCT in the last 72 hours. Sepsis Labs: Recent Labs  Lab 03/20/21 1446  LATICACIDVEN 1.2    Recent Results (from the past 240 hour(s))  Resp Panel by RT-PCR (Flu A&B, Covid) Nasopharyngeal Swab     Status: None   Collection Time: 03/20/21  5:07 PM   Specimen: Nasopharyngeal Swab; Nasopharyngeal(NP) swabs in vial transport medium  Result Value Ref Range Status   SARS Coronavirus 2 by RT PCR NEGATIVE NEGATIVE Final    Comment: (NOTE) SARS-CoV-2 target nucleic acids are NOT DETECTED.  The SARS-CoV-2 RNA is generally detectable in upper respiratory specimens during the acute phase of infection. The lowest concentration of SARS-CoV-2 viral copies this assay can detect is 138 copies/mL. A negative result does not preclude SARS-Cov-2 infection and should not be used as the sole basis for treatment or other patient management decisions. A negative result may occur with  improper specimen collection/handling, submission of specimen other than nasopharyngeal swab, presence of viral mutation(s) within the areas targeted by this assay, and inadequate number of  viral copies(<138 copies/mL). A negative result must be combined with clinical observations, patient history, and epidemiological information. The expected result is Negative.  Fact Sheet for Patients:  EntrepreneurPulse.com.au  Fact Sheet for Healthcare Providers:  IncredibleEmployment.be  This test is no t yet approved or cleared by the [place] FDA and  has been authorized for detection and/or diagnosis of SARS-CoV-2 by FDA under an Emergency Use Authorization (EUA). This EUA will remain  in effect (meaning this test can be used) for the duration of the COVID-19 declaration under Section 564(b)(1) of the Act, 21 U.S.C.section 360bbb-3(b)(1), unless the authorization is terminated  or revoked sooner.       Influenza A by PCR NEGATIVE NEGATIVE Final   Influenza B by PCR NEGATIVE NEGATIVE Final    Comment: (NOTE) The Xpert Xpress SARS-CoV-2/FLU/RSV plus assay is intended as an aid in the diagnosis of influenza from Nasopharyngeal swab specimens and should not be used as a sole basis for treatment. Nasal washings and aspirates are unacceptable for Xpert Xpress SARS-CoV-2/FLU/RSV testing.  Fact Sheet for Patients: EntrepreneurPulse.com.au  Fact Sheet for Healthcare Providers: IncredibleEmployment.be  This test is not yet approved or  cleared by the [place] and has been authorized for detection and/or diagnosis of SARS-CoV-2 by FDA under an Emergency Use Authorization (EUA). This EUA will remain in effect (meaning this test can be used) for the duration of the COVID-19 declaration under Section 564(b)(1) of the Act, 21 U.S.C. section 360bbb-3(b)(1), unless the authorization is terminated or revoked.  Performed at [facility], [address] [place], [place] [address]   MRSA Next Gen by PCR, Nasal     Status: Abnormal   Collection Time: 03/21/21  7:50 AM   Specimen: Nasal  Mucosa; Nasal Swab  Result Value Ref Range Status   MRSA by PCR Next Gen DETECTED (A) NOT DETECTED Final    Comment: RESULT CALLED TO, READ BACK BY AND VERIFIED WITH: SANDY RODRIGUEZ 03/21/21 0954 KLW (NOTE) The GeneXpert MRSA Assay (FDA approved for NASAL specimens only), is one component of a comprehensive MRSA colonization surveillance program. It is not intended to diagnose MRSA infection nor to guide or monitor treatment for MRSA infections. Test performance is not FDA approved in patients less than 45 years old. Performed at [facility], [address] [place], [place] [address]          Radiology Studies: DG Knee 2 Views Left  Result Date: 03/20/2021 CLINICAL DATA:  Bilateral knee pain and swelling, fell EXAM: RIGHT KNEE - 1-2 VIEW; LEFT KNEE - 1-2 VIEW COMPARISON:  03/07/2015 FINDINGS: Left knee: Frontal and lateral views are obtained. The bones are severely osteopenic. No acute fracture, subluxation, or dislocation. There is severe 3 compartmental osteoarthritis greatest in the patellofemoral compartment. No joint effusion. Diffuse atherosclerosis. Right knee: Frontal and lateral views are obtained. Bones are severely osteopenic. No fracture, subluxation, or dislocation. Severe 3 compartmental osteoarthritis greatest in the medial and lateral compartments. Trace joint effusion. Diffuse atherosclerosis. IMPRESSION: 1. No acute displaced fractures. 2. Severe bilateral 3 compartmental osteoarthritis. 3. Severe osteopenia. Electronically Signed   By: Randa Ngo M.D.   On: 03/20/2021 16:16   DG Knee 2 Views Right  Result Date: 03/20/2021 CLINICAL DATA:  Bilateral knee pain and swelling, fell EXAM: RIGHT KNEE - 1-2 VIEW; LEFT KNEE - 1-2 VIEW COMPARISON:  03/07/2015 FINDINGS: Left knee: Frontal and lateral views are obtained. The bones are severely osteopenic. No acute fracture, subluxation, or dislocation. There is severe 3 compartmental osteoarthritis greatest in the  patellofemoral compartment. No joint effusion. Diffuse atherosclerosis. Right knee: Frontal and lateral views are obtained. Bones are severely osteopenic. No fracture, subluxation, or dislocation. Severe 3 compartmental osteoarthritis greatest in the medial and lateral compartments. Trace joint effusion. Diffuse atherosclerosis. IMPRESSION: 1. No acute displaced fractures. 2. Severe bilateral 3 compartmental osteoarthritis. 3. Severe osteopenia. Electronically Signed   By: Randa Ngo M.D.   On: 03/20/2021 16:16   MR HEEL LEFT WO CONTRAST  Result Date: 03/20/2021 CLINICAL DATA:  Left heel ulceration for several weeks EXAM: MR OF THE LEFT HEEL WITHOUT CONTRAST TECHNIQUE: Multiplanar, multisequence MR imaging of the left calcaneus was performed. No intravenous contrast was administered. COMPARISON:  03/20/2021 FINDINGS: Bones/Joint/Cartilage There is abnormal T2 signal within the superficial aspect of the calcaneus, with overlying cortical irregularity, consistent with osteomyelitis. There is a large overlying soft tissue ulceration. Likely reactive marrow edema within the distal tibia, fibula, and talus consistent with degenerative change. Marked joint space narrowing throughout the hindfoot and ankle. Trace tibiotalar joint effusion. Ligaments No acute abnormalities. Muscles and Tendons No acute abnormalities. Soft tissues Soft tissue ulceration overlying the calcaneus measuring approximately  1.6 x 2.0 cm in size, extending to the posterior calcaneal cortex. No fluid collection or abscess. There is diffuse subcutaneous edema, most pronounced along the dorsum of the midfoot. IMPRESSION: 1. Large soft tissue ulceration overlying the calcaneus, with underlying calcaneal cortical irregularity and marrow edema consistent with osteomyelitis. 2. Extensive degenerative changes throughout the ankle and hindfoot. 3. Diffuse soft tissue edema.  No fluid collection or abscess. Electronically Signed   By: Randa Ngo  M.D.   On: 03/20/2021 23:31   DG Foot 2 Views Left  Result Date: 03/20/2021 CLINICAL DATA:  Open wound EXAM: LEFT FOOT - 2 VIEW COMPARISON:  None. FINDINGS: Ulcer noted overlying the calcaneus in the posterior heel soft tissues. No underlying bone destruction to suggest osteomyelitis. Diffuse osteopenia. No acute bony abnormality. Specifically, no fracture, subluxation, or dislocation. Calcification in the distal Achilles tendon region, likely related to old injury. Plantar calcaneal spur. Vascular calcifications noted. IMPRESSION: No acute bony abnormality. No evidence of osteomyelitis by plain films. Electronically Signed   By: Rolm Baptise M.D.   On: 03/20/2021 17:23        Scheduled Meds:  amLODipine  10 mg Oral Daily   aspirin  81 mg Oral Daily   atorvastatin  40 mg Oral Daily   carvedilol  25 mg Oral BID   Chlorhexidine Gluconate Cloth  6 each Topical Q0600   donepezil  10 mg Oral QHS   enoxaparin (LOVENOX) injection  0.5 mg/kg Subcutaneous Q24H   insulin aspart  0-9 Units Subcutaneous TID WC   insulin glargine-yfgn  15 Units Subcutaneous Daily   levothyroxine  50 mcg Oral Daily   mupirocin ointment  1 application Nasal BID   polyethylene glycol  17 g Oral Daily   Continuous Infusions:  ceFEPime (MAXIPIME) IV 2 g (03/21/21 1224)   Vancomycin       LOS: 0 days    Time spent: 30 minutes    Barb Merino, MD [facility] Pager [phone]

## 2021-03-21 NOTE — Interval H&P Note (Signed)
History and Physical Interval Note:  03/21/2021 3:47 PM  [name] [name]  has presented today for surgery, with the diagnosis of Atherosclerotic disease with gangrene.  The various methods of treatment have been discussed with the patient and family. After consideration of risks, benefits and other options for treatment, the patient has consented to  Procedure(s): Lower Extremity Angiography (Left) as a surgical intervention.  The patient's history has been reviewed, patient examined, no change in status, stable for surgery.  I have reviewed the patient's chart and labs.  Questions were answered to the patient's satisfaction.     Leotis Pain

## 2021-03-21 NOTE — TOC Progression Note (Addendum)
Transition of Care [facility]) - Progression Note    Patient Details  Name: [name] [name] MRN: [id number] Date of Birth: [birth year]  Transition of Care [facility]) CM/SW [place], RN Phone Number: 03/21/2021, 9:35 AM  Clinical Narrative:   Patient is from home where she lives with her son, She has a nurse aide that helps with ADLs she is She is bedbound with contracture at home, does get up to wheelchair to get around tho, will be followed by Podiatry, Vascular to do an angio, podiatry to do debridement tomorrow, TOC to follow for DC planning and needs         Expected Discharge Plan and Services                                                 Social Determinants of Health (SDOH) Interventions    Readmission Risk Interventions Readmission Risk Prevention Plan 06/01/2020 05/18/2020 11/19/2019  Transportation Screening Complete Complete Complete  PCP or Specialist Appt within 3-5 Days - Complete Complete  HRI or [place] - Complete Complete  Social Work Consult for [place] Planning/Counseling - Complete Complete  Palliative Care Screening - Not Applicable Complete  Medication Review Press photographer) Complete Complete Complete  PCP or Specialist appointment within 3-5 days of discharge Complete - -  SW Recovery Care/Counseling Consult Complete - -  [place] Not Applicable - -  Some recent data might be hidden

## 2021-03-21 NOTE — Progress Notes (Signed)
Anticoagulation monitoring(Lovenox):  75yo  F ordered Lovenox 40 mg Q24h    Filed Weights   03/20/21 1446  Weight: 90.7 kg (200 lb)   BMI 31   Lab Results  Component Value Date   CREATININE 1.51 (H) 03/21/2021   CREATININE 1.67 (H) 03/20/2021   CREATININE 2.02 (H) 06/03/2020   Estimated Creatinine Clearance: 37.8 mL/min (A) (by C-G formula based on SCr of 1.51 mg/dL (H)). Hemoglobin & Hematocrit     Component Value Date/Time   HGB 9.7 (L) 03/21/2021 0142   HGB 10.7 (L) 04/17/2014 1131   HCT 29.9 (L) 03/21/2021 0142   HCT 32.9 (L) 04/17/2014 1131     Per Protocol for Patient with estCrcl > 30 ml/min and BMI >30, will transition to Lovenox 0.5 mg/kg (45 mg) Q24h       Chinita Greenland PharmD Clinical Pharmacist 03/21/2021

## 2021-03-21 NOTE — Op Note (Signed)
[facility]  Percutaneous Study/Intervention Procedural Note   Date of Surgery: 03/21/2021  Surgeon(s):Avenell Sellers    Assistants:none  Pre-operative Diagnosis: PAD with ulceration left lower extremity  Post-operative diagnosis:  Same  Procedure(s) Performed:             1.  Ultrasound guidance for vascular access right femoral artery             2.  Catheter placement into left SFA from right femoral approach             3.  Aortogram and selective left lower extremity angiogram             4.  Percutaneous transluminal angioplasty of left peroneal artery and TP trunk with 2.5 mm balloon proximally and 2 mm balloon distally             5. StarClose closure device right femoral artery  EBL: 10 cc  Contrast: 50 cc  Fluoro Time: 11.6 minutes  Moderate Conscious Sedation Time: approximately 34 minutes using 1.5 mg of Versed and 75 mcg of Fentanyl              Indications:  Patient is a 75 y.o.female with a history of PAD and a heel ulcer. The patient is brought in for angiography for further evaluation and potential treatment.  Due to the limb threatening nature of the situation, angiogram was performed for attempted limb salvage. The patient is aware that if the procedure fails, amputation would be expected.  The patient also understands that even with successful revascularization, amputation may still be required due to the severity of the situation.  Risks and benefits are discussed and informed consent is obtained.   Procedure:  The patient was identified and appropriate procedural time out was performed.  The patient was then placed supine on the table and prepped and draped in the usual sterile fashion. Moderate conscious sedation was administered during a face to face encounter with the patient throughout the procedure with my supervision of the RN administering medicines and monitoring the patient's vital signs, pulse oximetry, telemetry and mental status  throughout from the start of the procedure until the patient was taken to the recovery room. Ultrasound was used to evaluate the right common femoral artery.  It was patent .  A digital ultrasound image was acquired.  A Seldinger needle was used to access the right common femoral artery under direct ultrasound guidance and a permanent image was performed.  A 0.035 J wire was advanced without resistance and a 5Fr sheath was placed.  Pigtail catheter was placed into the aorta and an AP aortogram was performed. This demonstrated normal renal arteries and normal aorta and iliac segments without significant stenosis. I then crossed the aortic bifurcation and advanced to the left femoral head. Selective left lower extremity angiogram was then performed. This demonstrated a high bifurcation of the femoral vessel with the catheter actually in the SFA, but a normal common femoral artery, profunda femoris artery, superficial femoral, and popliteal arteries.  There is then severe tibial disease after tibial trifurcation.  The tibioperoneal trunk had a high-grade stenosis in the peroneal artery was occluded in the proximal and mid segments with a small distal reconstitution.  The posterior tibial artery had a long occlusion without distal reconstitution.  The anterior tibial artery had a medium length occlusion with reconstitution in the mid vessel and then being continuous to the foot.. It was felt that it was in the patient's  best interest to proceed with intervention after these images to avoid a second procedure and a larger amount of contrast and fluoroscopy based off of the findings from the initial angiogram. The patient was systemically heparinized and a 6 [place] Ansell sheath was then placed over the [employer] wire. I then used a Kumpe catheter and the advantage wire to navigate down into the anterior tibial artery.  Attempts to cross this anterior tibial artery occlusion with both a Kumpe catheter and Nava  cross catheter and a 0.035 advantage wire and a 0.018 advantage wire were all unsuccessful due to the long calcific nature of the occlusion.  As the anterior tibial artery is not really feeding the heel where the ulcer is anyway, I turned my attention to the peroneal artery.  I was able to cross the stenosis in the tibioperoneal trunk and the occlusion in the peroneal artery and confirm intraluminal flow in the distal peroneal artery with a 0.018 Nava cross catheter and crossed with the 0.018 advantage wire.  The wire was then replaced and I used a 2 mm diameter by 15 cm length angioplasty balloon in the mid and distal peroneal artery and then a 2.5 mm diameter angioplasty balloon in the proximal peroneal artery and tibioperoneal trunk.  Each inflation was about 10 to 12 atm for 1 minute.  Completion imaging showed less than 30% residual stenosis now with inline flow through the peroneal artery although the flow into the foot was still somewhat sluggish due to distal disease. I elected to terminate the procedure. The sheath was removed and StarClose closure device was deployed in the right femoral artery with excellent hemostatic result. The patient was taken to the recovery room in stable condition having tolerated the procedure well.  Findings:               Aortogram:  This demonstrated normal renal arteries and normal aorta and iliac segments without significant stenosis.             Left Lower Extremity: Normal common femoral artery, profunda femoris artery, superficial femoral, and popliteal arteries.  There is then severe tibial disease after tibial trifurcation.  The tibioperoneal trunk had a high-grade stenosis in the peroneal artery was occluded in the proximal and mid segments with a small distal reconstitution.  The posterior tibial artery had a long occlusion without distal reconstitution.  The anterior tibial artery had a medium length occlusion with reconstitution in the mid vessel and then being  continuous to the foot.   Disposition: Patient was taken to the recovery room in stable condition having tolerated the procedure well.  Complications: None  Leotis Pain 03/21/2021 4:43 PM   This note was created with Dragon Medical transcription system. Any errors in dictation are purely unintentional.

## 2021-03-21 NOTE — Consult Note (Signed)
[facility] Vascular Consult Note  MRN : [id number]  [name] [name] is a 75 y.o. (05/13/1945) female who presents with chief complaint of  Chief Complaint  Patient presents with   Wound Infection   History of Present Illness:  [name] [name] is a 75 y.o. female with a PMH significant for Type II DM, HTN, CKD 4, hypothyroidism, H LD, GERD, Charcot foot, anemia.  She presented from home where she lives with her son to the ED on 03/20/2021 with left heel ulcer x several weeks.  At baseline, patient does not walk or perform any of her own ADLs.  They have a nursing assistant to provide her care. The nursing assistant noticed her foot wound today was draining purulent material.  In the ED, it was found that they had purulent foot ulcer with no signs of osteomyelitis on x-ray.  Podiatry will be taken to the operating room tomorrow for debridement.  As per the recommendation we were consulted by Dr. Sloan Leiter for possible endovascular intervention.  Current Facility-Administered Medications  Medication Dose Route Frequency Provider Last Rate Last Admin   acetaminophen (TYLENOL) tablet 650 mg  650 mg Oral Q6H PRN Richarda Osmond, MD       Or   acetaminophen (TYLENOL) suppository 650 mg  650 mg Rectal Q6H PRN Richarda Osmond, MD       amLODipine (NORVASC) tablet 10 mg  10 mg Oral Daily Richarda Osmond, MD   10 mg at 03/21/21 0946   aspirin chewable tablet 81 mg  81 mg Oral Daily Richarda Osmond, MD   81 mg at 03/21/21 0946   atorvastatin (LIPITOR) tablet 40 mg  40 mg Oral Daily Richarda Osmond, MD   40 mg at 03/21/21 0946   bisacodyl (DULCOLAX) suppository 10 mg  10 mg Rectal PRN Richarda Osmond, MD       carvedilol (COREG) tablet 25 mg  25 mg Oral BID Richarda Osmond, MD   25 mg at 03/21/21 0951   ceFEPIme (MAXIPIME) 2 g in sodium chloride 0.9 % 100 mL IVPB  2 g Intravenous Q12H Barb Merino, MD       Chlorhexidine Gluconate Cloth 2 % PADS 6  each  6 each Topical Q0600 Barb Merino, MD       donepezil (ARICEPT) tablet 10 mg  10 mg Oral QHS Doristine Mango L, MD   10 mg at 03/20/21 2352   enoxaparin (LOVENOX) injection 45 mg  0.5 mg/kg Subcutaneous Q24H Chinita Greenland A, RPH       insulin aspart (novoLOG) injection 0-9 Units  0-9 Units Subcutaneous TID WC Richarda Osmond, MD       insulin glargine-yfgn (SEMGLEE) injection 15 Units  15 Units Subcutaneous Daily Richarda Osmond, MD   15 Units at 03/21/21 0946   levothyroxine (SYNTHROID) tablet 50 mcg  50 mcg Oral Daily Richarda Osmond, MD   50 mcg at 03/21/21 0606   mupirocin ointment (BACTROBAN) 2 % 1 application  1 application Nasal BID Barb Merino, MD       polyethylene glycol (MIRALAX / GLYCOLAX) packet 17 g  17 g Oral Daily Richarda Osmond, MD       vancomycin (VANCOCIN) IVPB 750 mg/150 ml premix  750 mg Intravenous Q24H Barb Merino, MD       Past Medical History:  Diagnosis Date   Chronic diastolic CHF (congestive heart failure), NYHA class 3 ([place]) 06/30/2019   Diabetes mellitus  without complication ([place])    Humerus fracture 04/20/2018   left   Hyperlipidemia    Hypertension    Hypothyroidism    Vertigo    several yrs ago   Wears dentures    partial upper and lower (loose)   Past Surgical History:  Procedure Laterality Date   AMPUTATION TOE Right 05/31/2020   Procedure: AMPUTATION TOE;  Surgeon: Caroline More, DPM;  Location: [facility];  Service: Podiatry;  Laterality: Right;   CATARACT EXTRACTION W/PHACO Left 12/01/2018   Procedure: CATARACT EXTRACTION PHACO AND INTRAOCULAR LENS PLACEMENT ([place]) LEFT DIABETIC;  Surgeon: Birder Robson, MD;  Location: [place];  Service: Ophthalmology;  Laterality: Left;  Diabetic - insulin   COLONOSCOPY WITH PROPOFOL N/A 07/07/2019   Procedure: COLONOSCOPY WITH PROPOFOL;  Surgeon: Robert Bellow, MD;  Location: [facility];  Service: Endoscopy;  Laterality: N/A;   ESOPHAGOGASTRODUODENOSCOPY  (EGD) WITH PROPOFOL N/A 07/07/2019   Procedure: ESOPHAGOGASTRODUODENOSCOPY (EGD) WITH PROPOFOL;  Surgeon: Robert Bellow, MD;  Location: [facility];  Service: Endoscopy;  Laterality: N/A;   FOOT SURGERY Right    lesion excision   LOWER EXTREMITY ANGIOGRAPHY Right 05/19/2020   Procedure: Lower Extremity Angiography;  Surgeon: Katha Cabal, MD;  Location: [facility];  Service: Cardiovascular;  Laterality: Right;   LOWER EXTREMITY ANGIOGRAPHY Right 05/30/2020   Procedure: Lower Extremity Angiography;  Surgeon: Katha Cabal, MD;  Location: [place] [facility] [facility];  Service: Cardiovascular;  Laterality: Right;  Pedal Approach   Social History Social History   Tobacco Use   Smoking status: Never   Smokeless tobacco: Never  Vaping Use   Vaping Use: Never used  Substance Use Topics   Alcohol use: No   Drug use: Never   Family History Family History  Problem Relation Age of Onset   Breast cancer Neg Hx   Denies family history of peripheral artery disease, venous disease or renal disease.  No Known Allergies  REVIEW OF SYSTEMS (Negative unless checked)  Constitutional: [] Weight loss  [] Fever  [] Chills Cardiac: [] Chest pain   [] Chest pressure   [] Palpitations   [] Shortness of breath when laying flat   [] Shortness of breath at rest   [] Shortness of breath with exertion. Vascular:  [] Pain in legs with walking   [] Pain in legs at rest   [] Pain in legs when laying flat   [] Claudication   [] Pain in feet when walking  [] Pain in feet at rest  [] Pain in feet when laying flat   [] History of DVT   [] Phlebitis   [x] Swelling in legs   [] Varicose veins   [x] Non-healing ulcers Pulmonary:   [] Uses home oxygen   [] Productive cough   [] Hemoptysis   [] Wheeze  [] COPD   [] Asthma Neurologic:  [] Dizziness  [] Blackouts   [] Seizures   [] History of stroke   [] History of TIA  [] Aphasia   [] Temporary blindness   [] Dysphagia   [] Weakness or numbness in arms   [] Weakness or numbness in  legs Musculoskeletal:  [] Arthritis   [] Joint swelling   [] Joint pain   [] Low back pain Hematologic:  [] Easy bruising  [] Easy bleeding   [] Hypercoagulable state   [x] Anemic  [] Hepatitis Gastrointestinal:  [] Blood in stool   [] Vomiting blood  [] Gastroesophageal reflux/heartburn   [] Difficulty swallowing. Genitourinary:  [] Chronic kidney disease   [] Difficult urination  [] Frequent urination  [] Burning with urination   [] Blood in urine Skin:  [] Rashes   [] Ulcers   [] Wounds Psychological:  [] History of anxiety   []  History of major  depression.  Physical Examination  Vitals:   03/20/21 1937 03/21/21 0634 03/21/21 0801 03/21/21 1117  BP: (!) 142/77 (!) 128/94 (!) 125/46 135/64  Pulse: 76 (!) 55 (!) 58 60  Resp: 14 16 18 18   Temp: 98.4 F (36.9 C) 97.7 F (36.5 C) 98.2 F (36.8 C) 98.4 F (36.9 C)  TempSrc:      SpO2: 99% (!) 85% 99% 100%  Weight:      Height:       Body mass index is 31.32 kg/m. Gen:  WD/WN, NAD Head: [place]/AT, No temporalis wasting. Prominent temp pulse not noted. Ear/Nose/Throat: Hearing grossly intact, nares w/o erythema or drainage, oropharynx w/o Erythema/Exudate Eyes: Sclera non-icteric, conjunctiva clear Neck: Trachea midline.  No JVD.  Pulmonary:  Good air movement, respirations not labored, equal bilaterally.  Cardiac: RRR, normal S1, S2. Vascular:  Vessel Right Left  Radial Palpable Palpable  Ulnar Palpable Palpable  Brachial Palpable Palpable  Carotid Palpable, without bruit Palpable, without bruit  Aorta Not palpable N/A  Femoral Palpable Palpable  Popliteal Palpable Palpable  PT Non-Palpable Non-Palpable  DP Non-Palpable Non-Palpable   Left lower extremity: Thigh soft.  Calf soft.  Mild to moderate edema.  Heel ulceration with necrotic changes noted.  Unable to palpate pedal pulses.  Gastrointestinal: soft, non-tender/non-distended. No guarding/reflex.  Musculoskeletal: M/S 5/5 throughout.  Extremities without ischemic changes.  No deformity or  atrophy. No edema. Neurologic: Sensation grossly intact in extremities.  Symmetrical.  Speech is fluent. Motor exam as listed above. Psychiatric: Judgment intact, Mood & affect appropriate for pt's clinical situation. Dermatologic: As above  Lymph : No Cervical, Axillary, or Inguinal lymphadenopathy.  CBC Lab Results  Component Value Date   WBC 8.2 03/21/2021   HGB 9.7 (L) 03/21/2021   HCT 29.9 (L) 03/21/2021   MCV 90.9 03/21/2021   PLT 216 03/21/2021   BMET    Component Value Date/Time   NA 136 03/21/2021 0142   NA 142 04/17/2014 1131   K 4.1 03/21/2021 0142   K 4.0 04/17/2014 1131   CL 109 03/21/2021 0142   CL 109 (H) 04/17/2014 1131   CO2 22 03/21/2021 0142   CO2 24 04/17/2014 1131   GLUCOSE 148 (H) 03/21/2021 0142   GLUCOSE 89 04/17/2014 1131   BUN 34 (H) 03/21/2021 0142   BUN 22 (H) 04/17/2014 1131   CREATININE 1.51 (H) 03/21/2021 0142   CREATININE 1.78 (H) 04/17/2014 1131   CALCIUM 8.7 (L) 03/21/2021 0142   CALCIUM 8.8 04/17/2014 1131   GFRNONAA 36 (L) 03/21/2021 0142   GFRNONAA 30 (L) 04/17/2014 1131   GFRNONAA 37 (L) 04/22/2013 0946   GFRAA 29 (L) 12/07/2019 0645   GFRAA 37 (L) 04/17/2014 1131   GFRAA 42 (L) 04/22/2013 0946   Estimated Creatinine Clearance: 37.8 mL/min (A) (by C-G formula based on SCr of 1.51 mg/dL (H)).  COAG Lab Results  Component Value Date   INR 1.2 10/11/2019   INR 1.2 10/10/2019   Radiology DG Knee 2 Views Left  Result Date: 03/20/2021 CLINICAL DATA:  Bilateral knee pain and swelling, fell EXAM: RIGHT KNEE - 1-2 VIEW; LEFT KNEE - 1-2 VIEW COMPARISON:  03/07/2015 FINDINGS: Left knee: Frontal and lateral views are obtained. The bones are severely osteopenic. No acute fracture, subluxation, or dislocation. There is severe 3 compartmental osteoarthritis greatest in the patellofemoral compartment. No joint effusion. Diffuse atherosclerosis. Right knee: Frontal and lateral views are obtained. Bones are severely osteopenic. No fracture,  subluxation, or dislocation. Severe 3 compartmental osteoarthritis  greatest in the medial and lateral compartments. Trace joint effusion. Diffuse atherosclerosis. IMPRESSION: 1. No acute displaced fractures. 2. Severe bilateral 3 compartmental osteoarthritis. 3. Severe osteopenia. Electronically Signed   By: Randa Ngo M.D.   On: 03/20/2021 16:16   DG Knee 2 Views Right  Result Date: 03/20/2021 CLINICAL DATA:  Bilateral knee pain and swelling, fell EXAM: RIGHT KNEE - 1-2 VIEW; LEFT KNEE - 1-2 VIEW COMPARISON:  03/07/2015 FINDINGS: Left knee: Frontal and lateral views are obtained. The bones are severely osteopenic. No acute fracture, subluxation, or dislocation. There is severe 3 compartmental osteoarthritis greatest in the patellofemoral compartment. No joint effusion. Diffuse atherosclerosis. Right knee: Frontal and lateral views are obtained. Bones are severely osteopenic. No fracture, subluxation, or dislocation. Severe 3 compartmental osteoarthritis greatest in the medial and lateral compartments. Trace joint effusion. Diffuse atherosclerosis. IMPRESSION: 1. No acute displaced fractures. 2. Severe bilateral 3 compartmental osteoarthritis. 3. Severe osteopenia. Electronically Signed   By: Randa Ngo M.D.   On: 03/20/2021 16:16   MR HEEL LEFT WO CONTRAST  Result Date: 03/20/2021 CLINICAL DATA:  Left heel ulceration for several weeks EXAM: MR OF THE LEFT HEEL WITHOUT CONTRAST TECHNIQUE: Multiplanar, multisequence MR imaging of the left calcaneus was performed. No intravenous contrast was administered. COMPARISON:  03/20/2021 FINDINGS: Bones/Joint/Cartilage There is abnormal T2 signal within the superficial aspect of the calcaneus, with overlying cortical irregularity, consistent with osteomyelitis. There is a large overlying soft tissue ulceration. Likely reactive marrow edema within the distal tibia, fibula, and talus consistent with degenerative change. Marked joint space narrowing throughout  the hindfoot and ankle. Trace tibiotalar joint effusion. Ligaments No acute abnormalities. Muscles and Tendons No acute abnormalities. Soft tissues Soft tissue ulceration overlying the calcaneus measuring approximately 1.6 x 2.0 cm in size, extending to the posterior calcaneal cortex. No fluid collection or abscess. There is diffuse subcutaneous edema, most pronounced along the dorsum of the midfoot. IMPRESSION: 1. Large soft tissue ulceration overlying the calcaneus, with underlying calcaneal cortical irregularity and marrow edema consistent with osteomyelitis. 2. Extensive degenerative changes throughout the ankle and hindfoot. 3. Diffuse soft tissue edema.  No fluid collection or abscess. Electronically Signed   By: Randa Ngo M.D.   On: 03/20/2021 23:31   DG Foot 2 Views Left  Result Date: 03/20/2021 CLINICAL DATA:  Open wound EXAM: LEFT FOOT - 2 VIEW COMPARISON:  None. FINDINGS: Ulcer noted overlying the calcaneus in the posterior heel soft tissues. No underlying bone destruction to suggest osteomyelitis. Diffuse osteopenia. No acute bony abnormality. Specifically, no fracture, subluxation, or dislocation. Calcification in the distal Achilles tendon region, likely related to old injury. Plantar calcaneal spur. Vascular calcifications noted. IMPRESSION: No acute bony abnormality. No evidence of osteomyelitis by plain films. Electronically Signed   By: Rolm Baptise M.D.   On: 03/20/2021 17:23    Assessment/Plan [name] [name] is a 75 y.o. female with a PMH significant for Type II DM, HTN, CKD 4, hypothyroidism, H LD, GERD, Charcot foot, anemia.  1.  Heel Ulceration: Patient presents with left heel ulceration.  This ulceration has been present for few weeks.  Recently started to drain as per the patient's nursing assistant.  She is bedbound with contracture.  Unable to palpate pedal pulses.  Known history of atherosclerotic disease.  No ABI ordered by podiatry.  They will be taken to the operating  room tomorrow and suggested vascular surgery consult to assess arterial patency.  Recommend undergoing a left lower extremity angiogram possible intervention and attempt to  assess patient's anatomy contributing.  Also carotid disease.  If appropriate an attempt revascularize leg can be made at that time.  Procedure, risk benefits were explained to the patient and her son at the bedside.  All questions were answered.  We will plan on this today with Dr. Lucky Cowboy.  2.  Chronic Kidney Disease: Will try to limit the amount of contrast used to minimize effects on the kidney. AM BMP.  3.  Diabetes Type 2: On appropriate medications. Encouraged good control as its slows the progression of atherosclerotic disease   Discussed with Dr. Mayme Genta, PA-C 03/21/2021 12:18 PM  This note was created with Dragon medical transcription system.  Any error is purely unintentional.

## 2021-03-22 ENCOUNTER — Inpatient Hospital Stay: Payer: Medicare Other | Admitting: Anesthesiology

## 2021-03-22 ENCOUNTER — Encounter
Admission: EM | Disposition: A | Discharge status: Home Health | Payer: Self-pay | Source: Home / Self Care | Attending: Internal Medicine

## 2021-03-22 ENCOUNTER — Encounter: Payer: Self-pay | Admitting: Vascular Surgery

## 2021-03-22 DIAGNOSIS — M86172 Other acute osteomyelitis, left ankle and foot: Secondary | ICD-10-CM

## 2021-03-22 HISTORY — PX: IRRIGATION AND DEBRIDEMENT FOOT: SHX6602

## 2021-03-22 LAB — BASIC METABOLIC PANEL
Anion gap: 9 (ref 5–15)
BUN: 30 mg/dL — ABNORMAL HIGH (ref 8–23)
CO2: 20 mmol/L — ABNORMAL LOW (ref 22–32)
Calcium: 8.7 mg/dL — ABNORMAL LOW (ref 8.9–10.3)
Chloride: 107 mmol/L (ref 98–111)
Creatinine, Ser: 1.49 mg/dL — ABNORMAL HIGH (ref 0.44–1.00)
GFR, Estimated: 37 mL/min — ABNORMAL LOW (ref >60)
Glucose, Bld: 139 mg/dL — ABNORMAL HIGH (ref 70–99)
Potassium: 4.1 mmol/L (ref 3.5–5.1)
Sodium: 136 mmol/L (ref 135–145)

## 2021-03-22 LAB — GLUCOSE, CAPILLARY
Glucose-Capillary: 129 mg/dL — ABNORMAL HIGH (ref 70–99)
Glucose-Capillary: 129 mg/dL — ABNORMAL HIGH (ref 70–99)
Glucose-Capillary: 110 mg/dL — ABNORMAL HIGH (ref 70–99)
Glucose-Capillary: 112 mg/dL — ABNORMAL HIGH (ref 70–99)
Glucose-Capillary: 119 mg/dL — ABNORMAL HIGH (ref 70–99)

## 2021-03-22 SURGERY — IRRIGATION AND DEBRIDEMENT FOOT
Anesthesia: General | Laterality: Left

## 2021-03-22 MED ORDER — PROPOFOL 500 MG/50ML IV EMUL
INTRAVENOUS | Status: DC | PRN
  Administered 2021-03-22: 200 ug/kg/min via INTRAVENOUS

## 2021-03-22 MED ORDER — FENTANYL CITRATE (PF) 100 MCG/2ML IJ SOLN: 25.0000 ug | INTRAMUSCULAR | Status: DC | PRN

## 2021-03-22 MED ORDER — LIDOCAINE HCL (PF) 1 % IJ SOLN
INTRAMUSCULAR | Status: AC
  Filled 2021-03-22: qty 30

## 2021-03-22 MED ORDER — SODIUM CHLORIDE 0.9 % IV SOLN: INTRAVENOUS | Status: DC | PRN

## 2021-03-22 MED ORDER — BUPIVACAINE HCL (PF) 0.5 % IJ SOLN
INTRAMUSCULAR | Status: AC
  Filled 2021-03-22: qty 30

## 2021-03-22 MED ORDER — PROPOFOL 1000 MG/100ML IV EMUL
INTRAVENOUS | Status: AC
  Filled 2021-03-22: qty 100

## 2021-03-22 MED ORDER — ONDANSETRON HCL 4 MG/2ML IJ SOLN: 4.0000 mg | Freq: Once | INTRAMUSCULAR | Status: DC | PRN

## 2021-03-22 SURGICAL SUPPLY — 61 items
BAG COUNTER SPONGE SURGICOUNT (BAG) ×2 IMPLANT
BLADE OSC/SAGITTAL MD 5.5X18 (BLADE) IMPLANT
BLADE OSCILLATING/SAGITTAL (BLADE)
BLADE SW THK.38XMED LNG THN (BLADE) IMPLANT
BNDG CONFORM 2 STRL LF (GAUZE/BANDAGES/DRESSINGS) IMPLANT
BNDG CONFORM 3 STRL LF (GAUZE/BANDAGES/DRESSINGS) IMPLANT
BNDG ELASTIC 3X5.8 VLCR NS LF (GAUZE/BANDAGES/DRESSINGS) IMPLANT
BNDG ELASTIC 4X5.8 VLCR NS LF (GAUZE/BANDAGES/DRESSINGS) IMPLANT
BNDG ESMARK 4X12 TAN STRL LF (GAUZE/BANDAGES/DRESSINGS) ×2 IMPLANT
BNDG GAUZE ELAST 4 BULKY (GAUZE/BANDAGES/DRESSINGS) ×2 IMPLANT
CUFF TOURN SGL QUICK 12 (TOURNIQUET CUFF) IMPLANT
CUFF TOURN SGL QUICK 18X4 (TOURNIQUET CUFF) IMPLANT
DRAPE FLUOR MINI C-ARM 54X84 (DRAPES) IMPLANT
DRSG TELFA 3X8 NADH (GAUZE/BANDAGES/DRESSINGS) ×2 IMPLANT
DURAPREP 26ML APPLICATOR (WOUND CARE) ×1 IMPLANT
ELECT REM PT RETURN 9FT ADLT (ELECTROSURGICAL) ×2
ELECTRODE REM PT RTRN 9FT ADLT (ELECTROSURGICAL) ×1 IMPLANT
GAUZE 4X4 16PLY ~~LOC~~+RFID DBL (SPONGE) ×2 IMPLANT
GAUZE PACKING 1/4 X5 YD (GAUZE/BANDAGES/DRESSINGS) IMPLANT
GAUZE PACKING IODOFORM 1X5 (PACKING) IMPLANT
GAUZE SPONGE 4X4 12PLY STRL (GAUZE/BANDAGES/DRESSINGS) ×2 IMPLANT
GAUZE XEROFORM 1X8 LF (GAUZE/BANDAGES/DRESSINGS) ×1 IMPLANT
GLOVE SURG ENC MOIS LTX SZ7 (GLOVE) ×2 IMPLANT
GLOVE SURG UNDER LTX SZ7 (GLOVE) ×2 IMPLANT
GOWN STRL REUS W/ TWL LRG LVL3 (GOWN DISPOSABLE) ×2 IMPLANT
GOWN STRL REUS W/TWL LRG LVL3 (GOWN DISPOSABLE) ×2
HANDPIECE VERSAJET DEBRIDEMENT (MISCELLANEOUS) IMPLANT
IV NS 1000ML (IV SOLUTION)
IV NS 1000ML BAXH (IV SOLUTION) IMPLANT
IV NS IRRIG 3000ML ARTHROMATIC (IV SOLUTION) IMPLANT
KIT TURNOVER KIT A (KITS) ×2 IMPLANT
LABEL OR SOLS (LABEL) IMPLANT
MANIFOLD NEPTUNE II (INSTRUMENTS) ×2 IMPLANT
NDL BIOPSY JAMSHIDI 11X6 (NEEDLE) IMPLANT
NDL FILTER BLUNT 18X1 1/2 (NEEDLE) ×1 IMPLANT
NDL HYPO 25X1 1.5 SAFETY (NEEDLE) ×2 IMPLANT
NEEDLE BIOPSY JAMSHIDI 11X6 (NEEDLE) ×2 IMPLANT
NEEDLE FILTER BLUNT 18X 1/2SAF (NEEDLE)
NEEDLE FILTER BLUNT 18X1 1/2 (NEEDLE) IMPLANT
NEEDLE HYPO 25X1 1.5 SAFETY (NEEDLE) IMPLANT
NS IRRIG 500ML POUR BTL (IV SOLUTION) ×2 IMPLANT
PACK EXTREMITY ARMC (MISCELLANEOUS) ×2 IMPLANT
PAD ABD DERMACEA PRESS 5X9 (GAUZE/BANDAGES/DRESSINGS) ×2 IMPLANT
PAD DRESSING TELFA 3X8 NADH (GAUZE/BANDAGES/DRESSINGS) IMPLANT
PULSAVAC PLUS IRRIG FAN TIP (DISPOSABLE)
RASP SM TEAR CROSS CUT (RASP) IMPLANT
SOL PREP PVP 2OZ (MISCELLANEOUS)
SOLUTION PREP PVP 2OZ (MISCELLANEOUS) IMPLANT
STOCKINETTE STRL 6IN 960660 (GAUZE/BANDAGES/DRESSINGS) ×1 IMPLANT
SUT ETHILON 3-0 FS-10 30 BLK (SUTURE)
SUT ETHILON 4-0 (SUTURE)
SUT ETHILON 4-0 FS2 18XMFL BLK (SUTURE)
SUT VIC AB 3-0 SH 27 (SUTURE)
SUT VIC AB 3-0 SH 27X BRD (SUTURE) IMPLANT
SUT VIC AB 4-0 FS2 27 (SUTURE) IMPLANT
SUTURE EHLN 3-0 FS-10 30 BLK (SUTURE) IMPLANT
SUTURE ETHLN 4-0 FS2 18XMF BLK (SUTURE) IMPLANT
SWAB CULTURE AMIES ANAERIB BLU (MISCELLANEOUS) IMPLANT
SYR 10ML LL (SYRINGE) ×2 IMPLANT
TIP FAN IRRIG PULSAVAC PLUS (DISPOSABLE) IMPLANT
WATER STERILE IRR 500ML POUR (IV SOLUTION) ×2 IMPLANT

## 2021-03-22 NOTE — Transfer of Care (Signed)
Immediate Anesthesia Transfer of Care Note  Patient: [name] [name]  Procedure(s) Performed: IRRIGATION AND DEBRIDEMENT FOOT AND BONE BIOPSY (Left)  Patient Location: PACU  Anesthesia Type:General  Level of Consciousness: awake, alert  and oriented  Airway & Oxygen Therapy: Patient Spontanous Breathing and Patient connected to face mask oxygen  Post-op Assessment: Report given to RN and Post -op Vital signs reviewed and stable  Post vital signs: Reviewed and stable  Last Vitals:  Vitals Value Taken Time  BP 128/65 03/22/21 1715  Temp    Pulse 47 03/22/21 1718  Resp 16 03/22/21 1718  SpO2 100 % 03/22/21 1718  Vitals shown include unvalidated device data.  Last Pain:  Vitals:   03/22/21 1534  TempSrc: Temporal  PainSc: 0-No pain         Complications: No notable events documented.

## 2021-03-22 NOTE — Anesthesia Preprocedure Evaluation (Signed)
Anesthesia Evaluation  Patient identified by MRN, date of birth, ID band Patient awake    Reviewed: Allergy & Precautions, H&P , NPO status , Patient's Chart, lab work & pertinent test results, reviewed documented beta blocker date and time   Airway Mallampati: III   Neck ROM: full    Dental  (+) Poor Dentition   Pulmonary pneumonia, resolved,    Pulmonary exam normal        Cardiovascular Exercise Tolerance: Poor hypertension, On Medications + Peripheral Vascular Disease and +CHF  Normal cardiovascular exam Rhythm:regular Rate:Normal     Neuro/Psych  Neuromuscular disease negative psych ROS   GI/Hepatic Neg liver ROS, GERD  Medicated,  Endo/Other  diabetes, Poorly Controlled, Type 1, Insulin DependentHypothyroidism   Renal/GU CRFRenal disease  negative genitourinary   Musculoskeletal   Abdominal   Peds  Hematology  (+) Blood dyscrasia, anemia ,   Anesthesia Other Findings Past Medical History: 9/56/2130: Chronic diastolic CHF (congestive heart failure), NYHA  class 3 ([place]) No date: Diabetes mellitus without complication ([place]) 86/57/8469: Humerus fracture     Comment:  left No date: Hyperlipidemia No date: Hypertension No date: Hypothyroidism No date: Vertigo     Comment:  several yrs ago No date: Wears dentures     Comment:  partial upper and lower (loose) Past Surgical History: 05/31/2020: AMPUTATION TOE; Right     Comment:  Procedure: AMPUTATION TOE;  Surgeon: Caroline More, DPM;              Location: [facility];  Service: Podiatry;  Laterality:               Right; 12/01/2018: CATARACT EXTRACTION W/PHACO; Left     Comment:  Procedure: CATARACT EXTRACTION PHACO AND INTRAOCULAR               LENS PLACEMENT ([place]) LEFT DIABETIC;  Surgeon: Birder Robson, MD;  Location: [place];  Service:               Ophthalmology;  Laterality: Left;  Diabetic - insulin 07/07/2019: COLONOSCOPY  WITH PROPOFOL; N/A     Comment:  Procedure: COLONOSCOPY WITH PROPOFOL;  Surgeon: Robert Bellow, MD;  Location: [facility];  Service:               Endoscopy;  Laterality: N/A; 07/07/2019: ESOPHAGOGASTRODUODENOSCOPY (EGD) WITH PROPOFOL; N/A     Comment:  Procedure: ESOPHAGOGASTRODUODENOSCOPY (EGD) WITH               PROPOFOL;  Surgeon: Robert Bellow, MD;  Location:               [facility];  Service: Endoscopy;  Laterality: N/A; No date: FOOT SURGERY; Right     Comment:  lesion excision 05/19/2020: LOWER EXTREMITY ANGIOGRAPHY; Right     Comment:  Procedure: Lower Extremity Angiography;  Surgeon:               Katha Cabal, MD;  Location: [place] [facility];               Service: Cardiovascular;  Laterality: Right; 05/30/2020: LOWER EXTREMITY ANGIOGRAPHY; Right     Comment:  Procedure: Lower Extremity Angiography;  Surgeon:               Katha Cabal, MD;  Location: [place] [facility];  Service: Cardiovascular;  Laterality: Right;  Pedal               Approach 03/21/2021: LOWER EXTREMITY ANGIOGRAPHY; Left     Comment:  Procedure: Lower Extremity Angiography;  Surgeon: Algernon Huxley, MD;  Location: [facility];  Service:               Cardiovascular;  Laterality: Left; BMI    Body Mass Index: 31.32 kg/m     Reproductive/Obstetrics negative OB ROS                             Anesthesia Physical Anesthesia Plan  ASA: 4 and emergent  Anesthesia Plan: General   Post-op Pain Management:    Induction:   PONV Risk Score and Plan:   Airway Management Planned:   Additional Equipment:   Intra-op Plan:   Post-operative Plan:   Informed Consent: I have reviewed the patients History and Physical, chart, labs and discussed the procedure including the risks, benefits and alternatives for the proposed anesthesia with the patient or authorized representative who has indicated his/her  understanding and acceptance.     Dental Advisory Given  Plan Discussed with: CRNA  Anesthesia Plan Comments:         Anesthesia Quick Evaluation

## 2021-03-22 NOTE — Op Note (Signed)
Surgeon: Surgeon(s): Felipa Furnace, DPM  Assistants: None Pre-operative diagnosis: left foot infection  Post-operative diagnosis: same Procedure: Procedure(s) (LRB): IRRIGATION AND DEBRIDEMENT FOOT AND BONE BIOPSY (Left)  Pathology: * No specimens in log *  Pertinent Intra-op findings: Soft friable calcaneus noted into the medullary canal.  Osteomyelitis noted to the posterior heel.  Minimal bleeding noted  Anesthesia: Choice  Hemostasis: * Missing tourniquet times found for documented tourniquets in log: [id number] * EBL: minimal  Materials: 4 x 4 Gauze, Kerlix, Ace bandage ABD pad Injectables: None Complications: None  Indications for surgery: A 75 y.o. female presents with left heel osteomyelitis. Patient has failed all conservative therapy including but not limited to local wound care IV antibiotics. She wishes to have surgical correction of the foot/deformity. It was determined that patient would benefit from left foot debridement of the wound with bone biopsy. Informed surgical risk consent was reviewed and read aloud to the patient.  I reviewed the films.  I have discussed my findings with the patient in great detail.  I have discussed all risks including but not limited to infection, stiffness, scarring, limp, disability, deformity, damage to blood vessels and nerves, numbness, poor healing, need for braces, arthritis, chronic pain, amputation, death.  All benefits and realistic expectations discussed in great detail.  I have made no promises as to the outcome.  I have provided realistic expectations.  I have offered the patient a 2nd opinion, which they have declined and assured me they preferred to proceed despite the risks   Procedure in detail: The patient was both verbally and visually identified by myself, the nursing staff, and anesthesia staff in the preoperative holding area. They were then transferred to the operating room and placed on the operative table in supine  position.  Attention was directed to the left posterior pressure wound, using rongeur.  The wound was debrided down to the level of the bone.  At this time the bone was noted to be very soft and friable.  These findings were consistent with osteomyelitis.  The wound was debrided down to healthy bleeding tissue.  It is important note there was minimal bleeding.  The wound was thoroughly irrigated with pulse lavage and normal saline solution in standard technique.  Cultures were taken of the posterior heel sent to microbiology and pathology in standard technique.  Wound was dressed with 4 x 4 gauze Kerlix Ace bandage.  All bony prominences were adequately padded.  Disposition Findings were consistent with osteomyelitis of the heel tracking into the intramedullary canal.  However no purulent drainage was noted.  Minimal bleeding was noted.  Given these findings if it continues to get worse patient will benefit from a major amputation for source control.  I discussed this with the family.  If patient does not want undergo amputation then will need long-term IV antibiotics per infectious disease.  At the conclusion of the procedure the patient was awoken from anesthesia and found to have tolerated the procedure well any complications. There were transferred to PACU with vital signs stable and vascular status intact.  Boneta Lucks, DPM

## 2021-03-22 NOTE — Interval H&P Note (Signed)
History and Physical Interval Note:  03/22/2021 3:58 PM  [name]  has presented today for surgery, with the diagnosis of left foot infection.  The various methods of treatment have been discussed with the patient and family. After consideration of risks, benefits and other options for treatment, the patient has consented to  Procedure(s): IRRIGATION AND DEBRIDEMENT FOOT AND BONE BIOPSY (Left) as a surgical intervention.  The patient's history has been reviewed, patient examined, no change in status, stable for surgery.  I have reviewed the patient's chart and labs.  Questions were answered to the patient's satisfaction.     [name] [name]

## 2021-03-22 NOTE — Progress Notes (Signed)
PROGRESS NOTE    [name] [name]  EQA:[id number] DOB: 1945/04/26 DOA: 03/20/2021 PCP: Marguerita Merles, MD    Brief Narrative:  75 year old with history of type 2 diabetes, hypertension, CKD stage IV, hypothyroidism, Charcot foot and peripheral arterial disease.  Presenting to the emergency room with left heel ulcer for several weeks.  Patient is mostly bedbound.  She uses wheelchair to get around.  Followed by podiatry as outpatient.  Admitted for surgical intervention.   Assessment & Plan:   Principal Problem:   Foot ulcer due to secondary DM [facility]) Active Problems:   Diabetic foot ulcer (HCC)  Diabetic left heel ulcer/peripheral arterial disease with history of angiogram and stenting/calcaneal osteomyelitis: Continue vancomycin and cefepime today.  Redosed with help of pharmacy. Vascular intervention, angiogram and stenting 12/14.  Followed by vascular surgery. Patient is scheduled for left heel debridement with podiatry surgery today. PT OT and mobility.  Pain medications.  Type 2 diabetes: Well-controlled as per patient.  Significant peripheral arterial disease and neuropathy.  Currently on long-acting insulin, received in the morning.  Also on sliding scale insulin.  Essential hypertension/chronic diastolic dysfunction/CKD stage IV/hypothyroidism: Fairly stable at baseline.  Acute urinary retention: Secondary to immobility and right groin pain.  Foley catheter until improvement, periprocedural.   DVT prophylaxis:   Lovenox   Code Status: Full code Family Communication: None. Disposition Plan: Status is: Inpatient.  Remains inpatient: Surgical procedures planned.  IV antibiotics planned.        Consultants:  Vascular surgery Podiatry  Procedures:  None  Antimicrobials:  Vancomycin and cefepime 12/13---   Subjective:  Patient seen and examined.  Minimal pain in the right groin.  No other overnight events.  Apparently, she had retention of urine so they had  to insert a Foley catheter in.  Objective: Vitals:   03/21/21 2324 03/22/21 0439 03/22/21 0839 03/22/21 1150  BP: 129/60 (!) 129/59 (!) 129/57 (!) 131/49  Pulse: 75 68 72 (!) 110  Resp: 20 20 18 17   Temp: 99.3 F (37.4 C) 98.9 F (37.2 C) 98.4 F (36.9 C) 98.3 F (36.8 C)  TempSrc:      SpO2: 97% 95% 99% 99%  Weight:      Height:        Intake/Output Summary (Last 24 hours) at 03/22/2021 1211 Last data filed at 03/22/2021 1016 Gross per 24 hour  Intake 350.06 ml  Output 300 ml  Net 50.06 ml   Filed Weights   03/20/21 1446  Weight: 90.7 kg    Examination:  General exam: Appears calm and comfortable  Debilitated and chronically sick looking but not in any distress.  Respiratory system: Clear to auscultation. Respiratory effort normal.  No added sounds Cardiovascular system: S1 & S2 heard, RRR.  Gastrointestinal system: soft, non tender  Right groin dressing intact.  Mild tenderness. Central nervous system: Alert and oriented. No focal neurological deficits. Extremities:  Decreased muscle tone.  Contractures of both lower extremities at ankles.  Chronic ischemic changes.  Right foot toe amputation stump clean. Left heel with necrotic wound, pictures in the chart.    Data Reviewed: I have personally reviewed following labs and imaging studies  CBC: Recent Labs  Lab 03/20/21 1446 03/21/21 0142  WBC 9.1 8.2  NEUTROABS 5.0  --   HGB 10.8* 9.7*  HCT 34.0* 29.9*  MCV 93.2 90.9  PLT 231 979   Basic Metabolic Panel: Recent Labs  Lab 03/20/21 1446 03/21/21 0142 03/22/21 0424  NA 136 136 136  K 4.2 4.1 4.1  CL 109 109 107  CO2 21* 22 20*  GLUCOSE 141* 148* 139*  BUN 33* 34* 30*  CREATININE 1.67* 1.51* 1.49*  CALCIUM 9.1 8.7* 8.7*   GFR: Estimated Creatinine Clearance: 38.3 mL/min (A) (by C-G formula based on SCr of 1.49 mg/dL (H)). Liver Function Tests: Recent Labs  Lab 03/20/21 1446  AST 20  ALT 9  ALKPHOS 87  BILITOT 0.7  PROT 8.5*  ALBUMIN  3.3*   No results for input(s): LIPASE, AMYLASE in the last 168 hours. No results for input(s): AMMONIA in the last 168 hours. Coagulation Profile: No results for input(s): INR, PROTIME in the last 168 hours. Cardiac Enzymes: No results for input(s): CKTOTAL, CKMB, CKMBINDEX, TROPONINI in the last 168 hours. BNP (last 3 results) No results for input(s): PROBNP in the last 8760 hours. HbA1C: Recent Labs    03/20/21 1446  HGBA1C 6.7*   CBG: Recent Labs  Lab 03/21/21 1539 03/21/21 1727 03/21/21 2100 03/22/21 0836 03/22/21 1145  GLUCAP 118* 109* 189* 129* 129*   Lipid Profile: No results for input(s): CHOL, HDL, LDLCALC, TRIG, CHOLHDL, LDLDIRECT in the last 72 hours. Thyroid Function Tests: No results for input(s): TSH, T4TOTAL, FREET4, T3FREE, THYROIDAB in the last 72 hours. Anemia Panel: No results for input(s): VITAMINB12, FOLATE, FERRITIN, TIBC, IRON, RETICCTPCT in the last 72 hours. Sepsis Labs: Recent Labs  Lab 03/20/21 1446  LATICACIDVEN 1.2    Recent Results (from the past 240 hour(s))  Resp Panel by RT-PCR (Flu A&B, Covid) Nasopharyngeal Swab     Status: None   Collection Time: 03/20/21  5:07 PM   Specimen: Nasopharyngeal Swab; Nasopharyngeal(NP) swabs in vial transport medium  Result Value Ref Range Status   SARS Coronavirus 2 by RT PCR NEGATIVE NEGATIVE Final    Comment: (NOTE) SARS-CoV-2 target nucleic acids are NOT DETECTED.  The SARS-CoV-2 RNA is generally detectable in upper respiratory specimens during the acute phase of infection. The lowest concentration of SARS-CoV-2 viral copies this assay can detect is 138 copies/mL. A negative result does not preclude SARS-Cov-2 infection and should not be used as the sole basis for treatment or other patient management decisions. A negative result may occur with  improper specimen collection/handling, submission of specimen other than nasopharyngeal swab, presence of viral mutation(s) within the areas  targeted by this assay, and inadequate number of viral copies(<138 copies/mL). A negative result must be combined with clinical observations, patient history, and epidemiological information. The expected result is Negative.  Fact Sheet for Patients:  EntrepreneurPulse.com.au  Fact Sheet for Healthcare Providers:  IncredibleEmployment.be  This test is no t yet approved or cleared by the [place] FDA and  has been authorized for detection and/or diagnosis of SARS-CoV-2 by FDA under an Emergency Use Authorization (EUA). This EUA will remain  in effect (meaning this test can be used) for the duration of the COVID-19 declaration under Section 564(b)(1) of the Act, 21 U.S.C.section 360bbb-3(b)(1), unless the authorization is terminated  or revoked sooner.       Influenza A by PCR NEGATIVE NEGATIVE Final   Influenza B by PCR NEGATIVE NEGATIVE Final    Comment: (NOTE) The Xpert Xpress SARS-CoV-2/FLU/RSV plus assay is intended as an aid in the diagnosis of influenza from Nasopharyngeal swab specimens and should not be used as a sole basis for treatment. Nasal washings and aspirates are unacceptable for Xpert Xpress SARS-CoV-2/FLU/RSV testing.  Fact Sheet for Patients: EntrepreneurPulse.com.au  Fact Sheet for Healthcare Providers: IncredibleEmployment.be  This  test is not yet approved or cleared by the [place] and has been authorized for detection and/or diagnosis of SARS-CoV-2 by FDA under an Emergency Use Authorization (EUA). This EUA will remain in effect (meaning this test can be used) for the duration of the COVID-19 declaration under Section 564(b)(1) of the Act, 21 U.S.C. section 360bbb-3(b)(1), unless the authorization is terminated or revoked.  Performed at [facility], [address] [place], [place] [address]   MRSA Next Gen by PCR, Nasal     Status: Abnormal   Collection  Time: 03/21/21  7:50 AM   Specimen: Nasal Mucosa; Nasal Swab  Result Value Ref Range Status   MRSA by PCR Next Gen DETECTED (A) NOT DETECTED Final    Comment: RESULT CALLED TO, READ BACK BY AND VERIFIED WITH: SANDY RODRIGUEZ 03/21/21 0954 KLW (NOTE) The GeneXpert MRSA Assay (FDA approved for NASAL specimens only), is one component of a comprehensive MRSA colonization surveillance program. It is not intended to diagnose MRSA infection nor to guide or monitor treatment for MRSA infections. Test performance is not FDA approved in patients less than 18 years old. Performed at [facility], [address] [place], [place] [address]          Radiology Studies: DG Knee 2 Views Left  Result Date: 03/20/2021 CLINICAL DATA:  Bilateral knee pain and swelling, fell EXAM: RIGHT KNEE - 1-2 VIEW; LEFT KNEE - 1-2 VIEW COMPARISON:  03/07/2015 FINDINGS: Left knee: Frontal and lateral views are obtained. The bones are severely osteopenic. No acute fracture, subluxation, or dislocation. There is severe 3 compartmental osteoarthritis greatest in the patellofemoral compartment. No joint effusion. Diffuse atherosclerosis. Right knee: Frontal and lateral views are obtained. Bones are severely osteopenic. No fracture, subluxation, or dislocation. Severe 3 compartmental osteoarthritis greatest in the medial and lateral compartments. Trace joint effusion. Diffuse atherosclerosis. IMPRESSION: 1. No acute displaced fractures. 2. Severe bilateral 3 compartmental osteoarthritis. 3. Severe osteopenia. Electronically Signed   By: Randa Ngo M.D.   On: 03/20/2021 16:16   DG Knee 2 Views Right  Result Date: 03/20/2021 CLINICAL DATA:  Bilateral knee pain and swelling, fell EXAM: RIGHT KNEE - 1-2 VIEW; LEFT KNEE - 1-2 VIEW COMPARISON:  03/07/2015 FINDINGS: Left knee: Frontal and lateral views are obtained. The bones are severely osteopenic. No acute fracture, subluxation, or dislocation. There is severe 3  compartmental osteoarthritis greatest in the patellofemoral compartment. No joint effusion. Diffuse atherosclerosis. Right knee: Frontal and lateral views are obtained. Bones are severely osteopenic. No fracture, subluxation, or dislocation. Severe 3 compartmental osteoarthritis greatest in the medial and lateral compartments. Trace joint effusion. Diffuse atherosclerosis. IMPRESSION: 1. No acute displaced fractures. 2. Severe bilateral 3 compartmental osteoarthritis. 3. Severe osteopenia. Electronically Signed   By: Randa Ngo M.D.   On: 03/20/2021 16:16   MR HEEL LEFT WO CONTRAST  Result Date: 03/20/2021 CLINICAL DATA:  Left heel ulceration for several weeks EXAM: MR OF THE LEFT HEEL WITHOUT CONTRAST TECHNIQUE: Multiplanar, multisequence MR imaging of the left calcaneus was performed. No intravenous contrast was administered. COMPARISON:  03/20/2021 FINDINGS: Bones/Joint/Cartilage There is abnormal T2 signal within the superficial aspect of the calcaneus, with overlying cortical irregularity, consistent with osteomyelitis. There is a large overlying soft tissue ulceration. Likely reactive marrow edema within the distal tibia, fibula, and talus consistent with degenerative change. Marked joint space narrowing throughout the hindfoot and ankle. Trace tibiotalar joint effusion. Ligaments No acute abnormalities. Muscles and Tendons No acute abnormalities. Soft tissues Soft tissue  ulceration overlying the calcaneus measuring approximately 1.6 x 2.0 cm in size, extending to the posterior calcaneal cortex. No fluid collection or abscess. There is diffuse subcutaneous edema, most pronounced along the dorsum of the midfoot. IMPRESSION: 1. Large soft tissue ulceration overlying the calcaneus, with underlying calcaneal cortical irregularity and marrow edema consistent with osteomyelitis. 2. Extensive degenerative changes throughout the ankle and hindfoot. 3. Diffuse soft tissue edema.  No fluid collection or  abscess. Electronically Signed   By: Randa Ngo M.D.   On: 03/20/2021 23:31   PERIPHERAL VASCULAR CATHETERIZATION  Result Date: 03/21/2021 See surgical note for result.  DG Foot 2 Views Left  Result Date: 03/20/2021 CLINICAL DATA:  Open wound EXAM: LEFT FOOT - 2 VIEW COMPARISON:  None. FINDINGS: Ulcer noted overlying the calcaneus in the posterior heel soft tissues. No underlying bone destruction to suggest osteomyelitis. Diffuse osteopenia. No acute bony abnormality. Specifically, no fracture, subluxation, or dislocation. Calcification in the distal Achilles tendon region, likely related to old injury. Plantar calcaneal spur. Vascular calcifications noted. IMPRESSION: No acute bony abnormality. No evidence of osteomyelitis by plain films. Electronically Signed   By: Rolm Baptise M.D.   On: 03/20/2021 17:23        Scheduled Meds:  amLODipine  10 mg Oral Daily   aspirin  81 mg Oral Daily   atorvastatin  40 mg Oral Daily   carvedilol  25 mg Oral BID   Chlorhexidine Gluconate Cloth  6 each Topical Q0600   donepezil  10 mg Oral QHS   enoxaparin (LOVENOX) injection  0.5 mg/kg Subcutaneous Q24H   insulin aspart  0-9 Units Subcutaneous TID WC   insulin glargine-yfgn  15 Units Subcutaneous Daily   levothyroxine  50 mcg Oral Daily   mupirocin ointment  1 application Nasal BID   polyethylene glycol  17 g Oral Daily   Continuous Infusions:  ceFEPime (MAXIPIME) IV 2 g (03/22/21 0944)   Vancomycin 750 mg (03/21/21 1357)     LOS: 1 day    Time spent: 30 minutes    Barb Merino, MD [facility] Pager [phone]

## 2021-03-22 NOTE — Progress Notes (Signed)
Patient in room from from PACU, alert and oriented x 4, she states ready to eat, meal tray ordered, dressing to left foot, dry and intact, call bell in reach,  bed in low position, will continue to monitor.

## 2021-03-23 ENCOUNTER — Encounter: Payer: Self-pay | Admitting: Podiatry

## 2021-03-23 LAB — GLUCOSE, CAPILLARY
Glucose-Capillary: 106 mg/dL — ABNORMAL HIGH (ref 70–99)
Glucose-Capillary: 108 mg/dL — ABNORMAL HIGH (ref 70–99)
Glucose-Capillary: 140 mg/dL — ABNORMAL HIGH (ref 70–99)
Glucose-Capillary: 121 mg/dL — ABNORMAL HIGH (ref 70–99)

## 2021-03-23 MED ORDER — HYDROMORPHONE HCL 1 MG/ML IJ SOLN
0.5000 mg | INTRAMUSCULAR | Status: DC | PRN
  Administered 2021-03-23: 0.5 mg via INTRAVENOUS
  Filled 2021-03-23: qty 1

## 2021-03-23 NOTE — Progress Notes (Signed)
Sent Dr. Sloan Leiter secure chat and made him aware that EKG read 2nd degree AV block sinus tach with ventricular rate of 71. MD acknowledged and gave no new orders.

## 2021-03-23 NOTE — Plan of Care (Signed)

## 2021-03-23 NOTE — Progress Notes (Signed)
Pharmacy Antibiotic Note  [name] [name] is a 75 y.o. female admitted on 03/20/2021 with  DM foot infection/Osteomyelitis .  Pharmacy has been consulted for Vancomycin and Cefepime dosing.  -Patient will need surgical OR debridement and likely bone biopsy after MRI and vascular consultation.  Plan: -Cefepime 2 gm IV q12h  Crcl 38.3 mlmin  -Vancomycin 750 mg IV Q 24 hrs. Goal AUC 400-550. Expected AUC: 537 SCr used: 1.51 Cmin 15.6  Vd= 0.5   BMI 31  F/u Scr.  Will start maintenance Vancomycin dose earlier as patient was not fully loaded in ED    Height: 5\' 7"  (170.2 cm) Weight: 90.7 kg (200 lb) IBW/kg (Calculated) : 61.6  Temp (24hrs), Avg:98.3 F (36.8 C), Min:97.6 F (36.4 C), Max:99 F (37.2 C)  Recent Labs  Lab 03/20/21 1446 03/21/21 0142 03/22/21 0424  WBC 9.1 8.2  --   CREATININE 1.67* 1.51* 1.49*  LATICACIDVEN 1.2  --   --      Estimated Creatinine Clearance: 38.3 mL/min (A) (by C-G formula based on SCr of 1.49 mg/dL (H)).    No Known Allergies  Antimicrobials this admission: Vancomycin 12/13 >> Cefepime 12/13 >>  Dose adjustments this admission:    Microbiology results:   BCx:     UCx:      Sputum:    12/14 MRSA PCR: + 12/15 Bone cx:   Thank you for allowing pharmacy to be a part of this patients care.  [name] [name] A 03/23/2021 3:28 PM

## 2021-03-23 NOTE — Progress Notes (Addendum)
Dr. Sloan Leiter present, reviewed EKG and gave order to discontinue coreg.

## 2021-03-23 NOTE — Progress Notes (Signed)
Vitals taken by Glade Lloyd

## 2021-03-23 NOTE — Progress Notes (Signed)
Patient nauseated but refuses for RN to give her zofran. Patient asked for vinegar earlier for nausea and did not use. Patient tried to drink some vinegar at this time but states she cant drink much at this time. Son at bedside. RN offered to give patient zofran to help with nausea again and patient continues to refuse.

## 2021-03-23 NOTE — Progress Notes (Signed)
Spoke with Dr. Sloan Leiter and made MD aware that patient was noted to be in Afib yesterday in PACU but that she does not have a history of afib and that her heart rate is difficult to auscultate and doppler, patient asymptomatic and alert talking. MD gave order for 12 lead EKG.

## 2021-03-23 NOTE — Progress Notes (Signed)
Patient had extensive osteomyelitis of the heel and in the setting of nonambulatory status I believe patient will benefit from major amputation.  Vascular surgery was consulted and I will defer further management to them.  Nursing pain continue doing Betadine wet-to-dry dressing changes until amputation.

## 2021-03-23 NOTE — Progress Notes (Addendum)
PROGRESS NOTE    [name] [name]  NLZ:[id number] DOB: Nov 04, 1945 DOA: 03/20/2021 PCP: Marguerita Merles, MD    Brief Narrative:  75 year old with history of type 2 diabetes, hypertension, CKD stage IV, hypothyroidism, Charcot foot and peripheral arterial disease.  Presenting to the emergency room with left heel ulcer for several weeks.  Patient is mostly bedbound.  She uses wheelchair to get around.  Followed by podiatry as outpatient.  Admitted for surgical intervention.   Assessment & Plan:   Principal Problem:   Foot ulcer due to secondary DM [facility]) Active Problems:   Diabetic foot ulcer ([place])   Diabetic left heel ulcer/peripheral arterial disease with history of angiogram and stenting/acute calcaneal osteomyelitis: Patient currently on vancomycin and cefepime.  Blood cultures negative.  Surgical cultures pending. 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 Seen by vascular surgery, underwent angiogram and stenting. Patient with extensive comorbidities, she is bedbound with deep deformity of the foot, now with deep space infection and calcaneal infection which is less likely to heal with prolonged IV antibiotics.  Prolonged antibiotics and persistent infection may be more devastating than getting rid of infection source. She may benefit with left above-knee amputation. Reconsulted vascular surgery.  Type 2 diabetes: Well-controlled as per patient.  Significant peripheral arterial disease and neuropathy.  Currently on long-acting insulin.   Essential hypertension/chronic diastolic dysfunction/CKD stage IV/hypothyroidism: Fairly stable at baseline.  Acute urinary retention: Secondary to immobility and right groin pain.  Foley catheter until improvement, periprocedural.  Tachyarrhythmia: Patient's telemetry strip with sinus arrhythmia.  Some of the transmitted strips with second-degree AV block, discussed with cardiology for  consultation.   DVT prophylaxis:   Lovenox   Code Status: Full code Family Communication: Patient's son at the bedside. Disposition Plan: Status is: Inpatient.  Remains inpatient: Surgical procedures planned.  IV antibiotics        Consultants:  Vascular surgery Podiatry  Procedures:  None  Antimicrobials:  Vancomycin and cefepime 12/13---   Subjective:  Patient seen and examined.  At usual she was full of sense of humor. We discussed about need for possible amputation and surgical recommendation, she was anxious as anticipated but agreed. Came back to talk to her son at the bedside and explained to him about the possible benefits of above-knee amputation given severe peripheral arterial disease and poor chance of healing.  Both patient and her son agreed to talk to vascular surgery about amputation.  Denies any chest pain or palpitations.  Telemetry strip shows 2-1 block.  Beta-blockers discontinued.  Called cardiology consultation.  Objective: Vitals:   03/22/21 2346 03/23/21 0242 03/23/21 0748 03/23/21 1223  BP: (!) 111/51 127/61 129/66 (!) 112/50  Pulse: 62 75 60 69  Resp: 16 16 18 18   Temp: 98.1 F (36.7 C) 98.3 F (36.8 C) 99 F (37.2 C) 98.5 F (36.9 C)  TempSrc:   Oral   SpO2: 96% 94% 100% 98%  Weight:      Height:        Intake/Output Summary (Last 24 hours) at 03/23/2021 1329 Last data filed at 03/23/2021 0242 Gross per 24 hour  Intake 300 ml  Output 900 ml  Net -600 ml   Filed Weights   03/20/21 1446  Weight: 90.7 kg    Examination:  General exam: Appears calm and comfortable  Debilitated and chronically sick looking but not in any distress.  Respiratory system: Clear to auscultation. Respiratory effort normal.  No added sounds Cardiovascular system: S1 &  S2 heard, RRR.  Gastrointestinal system: soft, non tender  Right groin dressing intact.  Mild tenderness. Central nervous system: Alert and oriented. No focal neurological  deficits. Extremities:  Decreased muscle tone.  Contractures of both lower extremities at ankles.  Chronic ischemic changes.  Right foot toe amputation stump clean. Left heel lesion postsurgical dressing.  Not removed by me. Chronic ischemic changes.  Pedal pulses are not palpable.    Data Reviewed: I have personally reviewed following labs and imaging studies  CBC: Recent Labs  Lab 03/20/21 1446 03/21/21 0142  WBC 9.1 8.2  NEUTROABS 5.0  --   HGB 10.8* 9.7*  HCT 34.0* 29.9*  MCV 93.2 90.9  PLT 231 465   Basic Metabolic Panel: Recent Labs  Lab 03/20/21 1446 03/21/21 0142 03/22/21 0424  NA 136 136 136  K 4.2 4.1 4.1  CL 109 109 107  CO2 21* 22 20*  GLUCOSE 141* 148* 139*  BUN 33* 34* 30*  CREATININE 1.67* 1.51* 1.49*  CALCIUM 9.1 8.7* 8.7*   GFR: Estimated Creatinine Clearance: 38.3 mL/min (A) (by C-G formula based on SCr of 1.49 mg/dL (H)). Liver Function Tests: Recent Labs  Lab 03/20/21 1446  AST 20  ALT 9  ALKPHOS 87  BILITOT 0.7  PROT 8.5*  ALBUMIN 3.3*   No results for input(s): LIPASE, AMYLASE in the last 168 hours. No results for input(s): AMMONIA in the last 168 hours. Coagulation Profile: No results for input(s): INR, PROTIME in the last 168 hours. Cardiac Enzymes: No results for input(s): CKTOTAL, CKMB, CKMBINDEX, TROPONINI in the last 168 hours. BNP (last 3 results) No results for input(s): PROBNP in the last 8760 hours. HbA1C: Recent Labs    03/20/21 1446  HGBA1C 6.7*   CBG: Recent Labs  Lab 03/22/21 1558 03/22/21 1721 03/22/21 2259 03/23/21 0742 03/23/21 1224  GLUCAP 110* 112* 119* 106* 108*   Lipid Profile: No results for input(s): CHOL, HDL, LDLCALC, TRIG, CHOLHDL, LDLDIRECT in the last 72 hours. Thyroid Function Tests: No results for input(s): TSH, T4TOTAL, FREET4, T3FREE, THYROIDAB in the last 72 hours. Anemia Panel: No results for input(s): VITAMINB12, FOLATE, FERRITIN, TIBC, IRON, RETICCTPCT in the last 72 hours. Sepsis  Labs: Recent Labs  Lab 03/20/21 1446  LATICACIDVEN 1.2    Recent Results (from the past 240 hour(s))  Resp Panel by RT-PCR (Flu A&B, Covid) Nasopharyngeal Swab     Status: None   Collection Time: 03/20/21  5:07 PM   Specimen: Nasopharyngeal Swab; Nasopharyngeal(NP) swabs in vial transport medium  Result Value Ref Range Status   SARS Coronavirus 2 by RT PCR NEGATIVE NEGATIVE Final    Comment: (NOTE) SARS-CoV-2 target nucleic acids are NOT DETECTED.  The SARS-CoV-2 RNA is generally detectable in upper respiratory specimens during the acute phase of infection. The lowest concentration of SARS-CoV-2 viral copies this assay can detect is 138 copies/mL. A negative result does not preclude SARS-Cov-2 infection and should not be used as the sole basis for treatment or other patient management decisions. A negative result may occur with  improper specimen collection/handling, submission of specimen other than nasopharyngeal swab, presence of viral mutation(s) within the areas targeted by this assay, and inadequate number of viral copies(<138 copies/mL). A negative result must be combined with clinical observations, patient history, and epidemiological information. The expected result is Negative.  Fact Sheet for Patients:  EntrepreneurPulse.com.au  Fact Sheet for Healthcare Providers:  IncredibleEmployment.be  This test is no t yet approved or cleared by the [place] FDA  and  has been authorized for detection and/or diagnosis of SARS-CoV-2 by FDA under an Emergency Use Authorization (EUA). This EUA will remain  in effect (meaning this test can be used) for the duration of the COVID-19 declaration under Section 564(b)(1) of the Act, 21 U.S.C.section 360bbb-3(b)(1), unless the authorization is terminated  or revoked sooner.       Influenza A by PCR NEGATIVE NEGATIVE Final   Influenza B by PCR NEGATIVE NEGATIVE Final    Comment: (NOTE) The  Xpert Xpress SARS-CoV-2/FLU/RSV plus assay is intended as an aid in the diagnosis of influenza from Nasopharyngeal swab specimens and should not be used as a sole basis for treatment. Nasal washings and aspirates are unacceptable for Xpert Xpress SARS-CoV-2/FLU/RSV testing.  Fact Sheet for Patients: EntrepreneurPulse.com.au  Fact Sheet for Healthcare Providers: IncredibleEmployment.be  This test is not yet approved or cleared by the [place] FDA and has been authorized for detection and/or diagnosis of SARS-CoV-2 by FDA under an Emergency Use Authorization (EUA). This EUA will remain in effect (meaning this test can be used) for the duration of the COVID-19 declaration under Section 564(b)(1) of the Act, 21 U.S.C. section 360bbb-3(b)(1), unless the authorization is terminated or revoked.  Performed at [facility], [address] [place], [place] [address]   MRSA Next Gen by PCR, Nasal     Status: Abnormal   Collection Time: 03/21/21  7:50 AM   Specimen: Nasal Mucosa; Nasal Swab  Result Value Ref Range Status   MRSA by PCR Next Gen DETECTED (A) NOT DETECTED Final    Comment: RESULT CALLED TO, READ BACK BY AND VERIFIED WITH: SANDY RODRIGUEZ 03/21/21 0954 KLW (NOTE) The GeneXpert MRSA Assay (FDA approved for NASAL specimens only), is one component of a comprehensive MRSA colonization surveillance program. It is not intended to diagnose MRSA infection nor to guide or monitor treatment for MRSA infections. Test performance is not FDA approved in patients less than 69 years old. Performed at [facility], [place], [place] [address]   Aerobic/Anaerobic Culture w Gram Stain (surgical/deep wound)     Status: None (Preliminary result)   Collection Time: 03/22/21  5:07 PM   Specimen: Biopsy  Result Value Ref Range Status   Specimen Description BIOPSY BONE LEFT HEEL  Final   Special Requests NONE  Final    Gram Stain   Final    NO SQUAMOUS EPITHELIAL CELLS SEEN FEW WBC SEEN NO ORGANISMS SEEN    Culture   Final    NO GROWTH < 12 HOURS Performed at [facility], [place] [address] [place],  [address]    Report Status PENDING  Incomplete         Radiology Studies: PERIPHERAL VASCULAR CATHETERIZATION  Result Date: 03/21/2021 See surgical note for result.       Scheduled Meds:  amLODipine  10 mg Oral Daily   aspirin  81 mg Oral Daily   atorvastatin  40 mg Oral Daily   Chlorhexidine Gluconate Cloth  6 each Topical Q0600   donepezil  10 mg Oral QHS   enoxaparin (LOVENOX) injection  0.5 mg/kg Subcutaneous Q24H   insulin aspart  0-9 Units Subcutaneous TID WC   insulin glargine-yfgn  15 Units Subcutaneous Daily   levothyroxine  50 mcg Oral Daily   mupirocin ointment  1 application Nasal BID   polyethylene glycol  17 g Oral Daily   Continuous Infusions:  ceFEPime (MAXIPIME) IV 2 g (03/23/21 1028)   Vancomycin  Stopped (03/22/21 1331)     LOS: 2 days    Time spent: 30 minutes    Barb Merino, MD [facility] Pager [phone]

## 2021-03-23 NOTE — Care Management Important Message (Signed)
Important Message  Patient Details  Name: ZAMORA COLTON MRN: [id number] Date of Birth: 1945/05/25   Medicare Important Message Given:  Yes     Juliann Pulse A Leilah Polimeni 03/23/2021, 11:27 AM

## 2021-03-23 NOTE — Consult Note (Signed)
[facility] Consultation Note  Patient ID: [name] [name], MRN: [id number], DOB/AGE: Nov 28, 1945 75 y.o. Admit date: 03/20/2021   Date of Consult: 03/23/2021 Primary Physician: Marguerita Merles, MD Primary Cardiologist: None  Chief Complaint:  Chief Complaint  Patient presents with   Wound Infection   Reason for Consult:  Dysrhythmia  HPI: 75 y.o. female with apparent diastolic dysfunction congestive heart failure hypertension hyperlipidemia diabetes chronic kidney disease stage III peripheral vascular disease with foot ulcer for which the patient may need surgery.  The patient has had significant foot ulcer for which surgical intervention appears to be likely.  She has been doing well from the cardiovascular standpoint on appropriate medication management for hypertension hyperlipidemia and stable.  There has been no concerns of congestive heart failure or anginal symptoms at this time.  EKG has shown that the patient has normal sinus rhythm with free atrial contractions and some other dysrhythmias appearing by her pulse.  These are most consistent with free atrial contractions and there is no evidence current heart block in any accounts.  The patient is hemodynamically stable at this time  Past Medical History:  Diagnosis Date   Chronic diastolic CHF (congestive heart failure), NYHA class 3 ([place]) 06/30/2019   Diabetes mellitus without complication ([place])    Humerus fracture 04/20/2018   left   Hyperlipidemia    Hypertension    Hypothyroidism    Vertigo    several yrs ago   Wears dentures    partial upper and lower (loose)      Surgical History:  Past Surgical History:  Procedure Laterality Date   AMPUTATION TOE Right 05/31/2020   Procedure: AMPUTATION TOE;  Surgeon: Caroline More, DPM;  Location: [facility];  Service: Podiatry;  Laterality: Right;   CATARACT EXTRACTION W/PHACO Left 12/01/2018   Procedure: CATARACT EXTRACTION PHACO AND INTRAOCULAR LENS PLACEMENT ([place])  LEFT DIABETIC;  Surgeon: Birder Robson, MD;  Location: [place];  Service: Ophthalmology;  Laterality: Left;  Diabetic - insulin   COLONOSCOPY WITH PROPOFOL N/A 07/07/2019   Procedure: COLONOSCOPY WITH PROPOFOL;  Surgeon: Robert Bellow, MD;  Location: [facility];  Service: Endoscopy;  Laterality: N/A;   ESOPHAGOGASTRODUODENOSCOPY (EGD) WITH PROPOFOL N/A 07/07/2019   Procedure: ESOPHAGOGASTRODUODENOSCOPY (EGD) WITH PROPOFOL;  Surgeon: Robert Bellow, MD;  Location: [facility];  Service: Endoscopy;  Laterality: N/A;   FOOT SURGERY Right    lesion excision   IRRIGATION AND DEBRIDEMENT FOOT Left 03/22/2021   Procedure: IRRIGATION AND DEBRIDEMENT FOOT AND BONE BIOPSY;  Surgeon: Felipa Furnace, DPM;  Location: [facility];  Service: Podiatry;  Laterality: Left;   LOWER EXTREMITY ANGIOGRAPHY Right 05/19/2020   Procedure: Lower Extremity Angiography;  Surgeon: Katha Cabal, MD;  Location: [place] [facility];  Service: Cardiovascular;  Laterality: Right;   LOWER EXTREMITY ANGIOGRAPHY Right 05/30/2020   Procedure: Lower Extremity Angiography;  Surgeon: Katha Cabal, MD;  Location: [place] [facility];  Service: Cardiovascular;  Laterality: Right;  Pedal Approach   LOWER EXTREMITY ANGIOGRAPHY Left 03/21/2021   Procedure: Lower Extremity Angiography;  Surgeon: Algernon Huxley, MD;  Location: [place] [facility];  Service: Cardiovascular;  Laterality: Left;     Home Meds: Prior to Admission medications   Medication Sig Start Date End Date Taking? Authorizing Provider  amLODipine (NORVASC) 10 MG tablet Take 1 tablet by mouth daily. 05/02/16  Yes [provider]  ASPIRIN 81 PO Take by mouth daily.   Yes [provider]  atorvastatin (LIPITOR)  40 MG tablet Take 1 tablet by mouth at bedtime.  05/02/16  Yes [provider]  bisacodyl (DULCOLAX) 10 MG suppository Place 1 suppository (10 mg total) rectally as needed for moderate constipation. 04/02/20   Yes Merlyn Lot, MD  carvedilol (COREG) 25 MG tablet Take 1 tablet by mouth 2 (two) times daily. 05/02/16  Yes [provider]  donepezil (ARICEPT) 10 MG tablet Take 10 mg by mouth at bedtime.  04/21/18  Yes [provider]  insulin aspart protamine- aspart (NOVOLOG MIX 70/30) (70-30) 100 UNIT/ML injection Inject 15 Units into the skin 2 (two) times daily with a meal.   Yes [provider]  polyethylene glycol (MIRALAX / GLYCOLAX) 17 g packet Take 17 g by mouth daily. Mix one tablespoon with 8oz of your favorite juice or water every day until you are having soft formed stools. Then start taking once daily if you didn't have a stool the day before. 04/02/20  Yes Merlyn Lot, MD  SYNTHROID 50 MCG tablet Take 1 tablet by mouth daily. 04/21/18  Yes [provider]  TRESIBA FLEXTOUCH 100 UNIT/ML SOPN FlexTouch Pen Inject 80 Units into the skin at bedtime.  04/21/18  Yes [provider]  gabapentin (NEURONTIN) 100 MG capsule Take 1 capsule (100 mg total) by mouth 2 (two) times daily as needed for up to 14 days. 06/02/20 06/16/20  Sidney Ace, MD    Inpatient Medications:   amLODipine  10 mg Oral Daily   aspirin  81 mg Oral Daily   atorvastatin  40 mg Oral Daily   Chlorhexidine Gluconate Cloth  6 each Topical Q0600   donepezil  10 mg Oral QHS   enoxaparin (LOVENOX) injection  0.5 mg/kg Subcutaneous Q24H   insulin aspart  0-9 Units Subcutaneous TID WC   insulin glargine-yfgn  15 Units Subcutaneous Daily   levothyroxine  50 mcg Oral Daily   mupirocin ointment  1 application Nasal BID   polyethylene glycol  17 g Oral Daily    ceFEPime (MAXIPIME) IV 2 g (03/23/21 1028)   Vancomycin 750 mg (03/23/21 1342)    Allergies: No Known Allergies  Social History   Socioeconomic History   Marital status: Divorced    Spouse name: Not on file   Number of children: Not on file   Years of education: Not on file   Highest education level: Not on  file  Occupational History   Not on file  Tobacco Use   Smoking status: Never   Smokeless tobacco: Never  Vaping Use   Vaping Use: Never used  Substance and Sexual Activity   Alcohol use: No   Drug use: Never   Sexual activity: Not on file  Other Topics Concern   Not on file  Social History Narrative   Not on file   Social Determinants of Health   Financial Resource Strain: Not on file  Food Insecurity: Not on file  Transportation Needs: Not on file  Physical Activity: Not on file  Stress: Not on file  Social Connections: Not on file  Intimate Partner Violence: Not on file     Family History  Problem Relation Age of Onset   Breast cancer Neg Hx      Review of Systems Positive for foot pain Negative for: General:  chills, fever, night sweats or weight changes.  Cardiovascular: PND orthopnea syncope dizziness  Dermatological skin lesions rashes Respiratory: Cough congestion Urologic: Frequent urination urination at night and hematuria Abdominal: negative for nausea, vomiting,  diarrhea, bright red blood per rectum, melena, or hematemesis Neurologic: negative for visual changes, and/or hearing changes  All other systems reviewed and are otherwise negative except as noted above.  Labs: No results for input(s): CKTOTAL, CKMB, TROPONINI in the last 72 hours. Lab Results  Component Value Date   WBC 8.2 03/21/2021   HGB 9.7 (L) 03/21/2021   HCT 29.9 (L) 03/21/2021   MCV 90.9 03/21/2021   PLT 216 03/21/2021    Recent Labs  Lab 03/20/21 1446 03/21/21 0142 03/22/21 0424  NA 136   < > 136  K 4.2   < > 4.1  CL 109   < > 107  CO2 21*   < > 20*  BUN 33*   < > 30*  CREATININE 1.67*   < > 1.49*  CALCIUM 9.1   < > 8.7*  PROT 8.5*  --   --   BILITOT 0.7  --   --   ALKPHOS 87  --   --   ALT 9  --   --   AST 20  --   --   GLUCOSE 141*   < > 139*   < > = values in this interval not displayed.   Lab Results  Component Value Date   CHOL 174 04/21/2013   HDL 34 (L)  04/21/2013   LDLCALC 95 04/21/2013   TRIG 224 (H) 04/21/2013   No results found for: DDIMER  Radiology/Studies:  DG Knee 2 Views Left  Result Date: 03/20/2021 CLINICAL DATA:  Bilateral knee pain and swelling, fell EXAM: RIGHT KNEE - 1-2 VIEW; LEFT KNEE - 1-2 VIEW COMPARISON:  03/07/2015 FINDINGS: Left knee: Frontal and lateral views are obtained. The bones are severely osteopenic. No acute fracture, subluxation, or dislocation. There is severe 3 compartmental osteoarthritis greatest in the patellofemoral compartment. No joint effusion. Diffuse atherosclerosis. Right knee: Frontal and lateral views are obtained. Bones are severely osteopenic. No fracture, subluxation, or dislocation. Severe 3 compartmental osteoarthritis greatest in the medial and lateral compartments. Trace joint effusion. Diffuse atherosclerosis. IMPRESSION: 1. No acute displaced fractures. 2. Severe bilateral 3 compartmental osteoarthritis. 3. Severe osteopenia. Electronically Signed   By: Randa Ngo M.D.   On: 03/20/2021 16:16   DG Knee 2 Views Right  Result Date: 03/20/2021 CLINICAL DATA:  Bilateral knee pain and swelling, fell EXAM: RIGHT KNEE - 1-2 VIEW; LEFT KNEE - 1-2 VIEW COMPARISON:  03/07/2015 FINDINGS: Left knee: Frontal and lateral views are obtained. The bones are severely osteopenic. No acute fracture, subluxation, or dislocation. There is severe 3 compartmental osteoarthritis greatest in the patellofemoral compartment. No joint effusion. Diffuse atherosclerosis. Right knee: Frontal and lateral views are obtained. Bones are severely osteopenic. No fracture, subluxation, or dislocation. Severe 3 compartmental osteoarthritis greatest in the medial and lateral compartments. Trace joint effusion. Diffuse atherosclerosis. IMPRESSION: 1. No acute displaced fractures. 2. Severe bilateral 3 compartmental osteoarthritis. 3. Severe osteopenia. Electronically Signed   By: Randa Ngo M.D.   On: 03/20/2021 16:16   MR HEEL  LEFT WO CONTRAST  Result Date: 03/20/2021 CLINICAL DATA:  Left heel ulceration for several weeks EXAM: MR OF THE LEFT HEEL WITHOUT CONTRAST TECHNIQUE: Multiplanar, multisequence MR imaging of the left calcaneus was performed. No intravenous contrast was administered. COMPARISON:  03/20/2021 FINDINGS: Bones/Joint/Cartilage There is abnormal T2 signal within the superficial aspect of the calcaneus, with overlying cortical irregularity, consistent with osteomyelitis. There is a large overlying soft tissue ulceration. Likely reactive marrow edema within the distal tibia, fibula, and  talus consistent with degenerative change. Marked joint space narrowing throughout the hindfoot and ankle. Trace tibiotalar joint effusion. Ligaments No acute abnormalities. Muscles and Tendons No acute abnormalities. Soft tissues Soft tissue ulceration overlying the calcaneus measuring approximately 1.6 x 2.0 cm in size, extending to the posterior calcaneal cortex. No fluid collection or abscess. There is diffuse subcutaneous edema, most pronounced along the dorsum of the midfoot. IMPRESSION: 1. Large soft tissue ulceration overlying the calcaneus, with underlying calcaneal cortical irregularity and marrow edema consistent with osteomyelitis. 2. Extensive degenerative changes throughout the ankle and hindfoot. 3. Diffuse soft tissue edema.  No fluid collection or abscess. Electronically Signed   By: Randa Ngo M.D.   On: 03/20/2021 23:31   PERIPHERAL VASCULAR CATHETERIZATION  Result Date: 03/21/2021 See surgical note for result.  DG Foot 2 Views Left  Result Date: 03/20/2021 CLINICAL DATA:  Open wound EXAM: LEFT FOOT - 2 VIEW COMPARISON:  None. FINDINGS: Ulcer noted overlying the calcaneus in the posterior heel soft tissues. No underlying bone destruction to suggest osteomyelitis. Diffuse osteopenia. No acute bony abnormality. Specifically, no fracture, subluxation, or dislocation. Calcification in the distal Achilles  tendon region, likely related to old injury. Plantar calcaneal spur. Vascular calcifications noted. IMPRESSION: No acute bony abnormality. No evidence of osteomyelitis by plain films. Electronically Signed   By: Rolm Baptise M.D.   On: 03/20/2021 17:23    EKG: Normal sinus rhythm with free atrial contractions and no current evidence of heart block  Weights: Filed Weights   03/20/21 1446  Weight: 90.7 kg     Physical Exam: Blood pressure (!) 112/50, pulse 69, temperature 98.5 F (36.9 C), temperature source Oral, resp. rate 18, height 5\' 7"  (1.702 m), weight 90.7 kg, SpO2 98 %. Body mass index is 31.32 kg/m. General: Well developed, well nourished, in no acute distress. Head eyes ears nose throat: Normocephalic, atraumatic, sclera non-icteric, no xanthomas, nares are without discharge. No apparent thyromegaly and/or mass  Lungs: Normal respiratory effort.  no wheezes, no rales, no rhonchi.  Heart: RRR with normal S1 S2. no murmur gallop, no rub, PMI is normal size and placement, carotid upstroke normal without bruit, jugular venous pressure is normal Abdomen: Soft, non-tender, non-distended with normoactive bowel sounds. No hepatomegaly. No rebound/guarding. No obvious abdominal masses. Abdominal aorta is normal size without bruit Extremities: Trace edema. no cyanosis, no clubbing, positive ulcers  Peripheral : 2+ bilateral upper extremity pulses, 2+ bilateral femoral pulses, 2+ bilateral dorsal pedal pulse Neuro: Alert and oriented. No facial asymmetry. No focal deficit. Moves all extremities spontaneously. Musculoskeletal: Normal muscle tone without kyphosis Psych:  Responds to questions appropriately with a normal affect.    Assessment: 75 year old female with chronic kidney disease stage III hypertension hyperlipidemia diabetes peripheral vascular disease foot ulcer and chronic diastolic dysfunction congestive heart failure without evidence of acute coronary syndrome angina and/or  heart failure at this time with an EKG showing normal sinus rhythm with frequent free atrial contractions and no current evidence of heart block  Plan: 1.  Continue supportive care of foot ulcer and vascular ischemia and would proceed to surgical intervention as necessary without restriction due to no current evidence of congestive heart failure angina or significant rhythm disturbances and/or heart block 2.  Continue medication management for chronic diastolic dysfunction congestive heart failure including amlodipine and beta-blocker as necessary 3.  No further cardiac diagnostics necessary at this time 4.  Physical rehabilitation and other antibiotics as necessary for current concerns  Signed, Elson Clan.D.  [facility] 03/23/2021, 3:42 PM

## 2021-03-24 LAB — VANCOMYCIN, PEAK: Vancomycin Pk: 40 ug/mL (ref 30–40)

## 2021-03-24 LAB — GLUCOSE, CAPILLARY
Glucose-Capillary: 88 mg/dL (ref 70–99)
Glucose-Capillary: 83 mg/dL (ref 70–99)
Glucose-Capillary: 108 mg/dL — ABNORMAL HIGH (ref 70–99)
Glucose-Capillary: 89 mg/dL (ref 70–99)

## 2021-03-24 MED ORDER — SODIUM CHLORIDE 0.9 % IV SOLN: INTRAVENOUS | Status: DC

## 2021-03-24 NOTE — Progress Notes (Signed)
[facility] Encounter Note  Patient: [name] [name] / Admit Date: 03/20/2021 / Date of Encounter: 03/24/2021, 7:08 AM   Subjective: Patient's condition not significantly changed from yesterday.  Still pain in the foot with foot ulcer and peripheral vascular disease.  Patient does have slightly irregular pulse most consistent with her previous EKG showing frequent preatrial contractions.  No evidence of heart block by EKG at this time.  No evidence of cardiovascular symptoms of chest pain or heart failure  Review of Systems: Positive for: Foot pain Negative for: Vision change, hearing change, syncope, dizziness, nausea, vomiting,diarrhea, bloody stool, stomach pain, cough, congestion, diaphoresis, urinary frequency, urinary pain,skin lesions, skin rashes Others previously listed  Objective: Telemetry: EKG showing normal sinus rhythm with preatrial contractions Physical Exam: Blood pressure (!) 135/50, pulse (!) 48, temperature 98.3 F (36.8 C), resp. rate 18, height 5\' 7"  (1.702 m), weight 90.7 kg, SpO2 100 %. Body mass index is 31.32 kg/m. General: Well developed, well nourished, in no acute distress. Head: Normocephalic, atraumatic, sclera non-icteric, no xanthomas, nares are without discharge. Neck: No apparent masses Lungs: Normal respirations with no wheezes, no rhonchi, no rales , no crackles   Heart: Irregular rate and rhythm, normal S1 S2, no murmur, no rub, no gallop, PMI is normal size and placement, carotid upstroke normal without bruit, jugular venous pressure normal Abdomen: Soft, non-tender, non-distended with normoactive bowel sounds. No hepatosplenomegaly. Abdominal aorta is normal size without bruit Extremities: Trace edema, no clubbing, no cyanosis, positive ulcers,  Peripheral: 2+ radial, 2+ femoral, 2+ dorsal pedal pulses Neuro: Alert and oriented. Moves all extremities spontaneously. Psych:  Responds to questions appropriately with a normal  affect.   Intake/Output Summary (Last 24 hours) at 03/24/2021 0708 Last data filed at 03/24/2021 0430 Gross per 24 hour  Intake 0 ml  Output 1100 ml  Net -1100 ml    Inpatient Medications:   amLODipine  10 mg Oral Daily   aspirin  81 mg Oral Daily   atorvastatin  40 mg Oral Daily   Chlorhexidine Gluconate Cloth  6 each Topical Q0600   donepezil  10 mg Oral QHS   enoxaparin (LOVENOX) injection  0.5 mg/kg Subcutaneous Q24H   insulin aspart  0-9 Units Subcutaneous TID WC   insulin glargine-yfgn  15 Units Subcutaneous Daily   levothyroxine  50 mcg Oral Daily   mupirocin ointment  1 application Nasal BID   polyethylene glycol  17 g Oral Daily   Infusions:   ceFEPime (MAXIPIME) IV 2 g (03/23/21 2114)   Vancomycin 750 mg (03/23/21 1342)    Labs: Recent Labs    03/22/21 0424  NA 136  K 4.1  CL 107  CO2 20*  GLUCOSE 139*  BUN 30*  CREATININE 1.49*  CALCIUM 8.7*   No results for input(s): AST, ALT, ALKPHOS, BILITOT, PROT, ALBUMIN in the last 72 hours. No results for input(s): WBC, NEUTROABS, HGB, HCT, MCV, PLT in the last 72 hours. No results for input(s): CKTOTAL, CKMB, TROPONINI in the last 72 hours. Invalid input(s): POCBNP No results for input(s): HGBA1C in the last 72 hours.   Weights: Filed Weights   03/20/21 1446  Weight: 90.7 kg     Radiology/Studies:  DG Knee 2 Views Left  Result Date: 03/20/2021 CLINICAL DATA:  Bilateral knee pain and swelling, fell EXAM: RIGHT KNEE - 1-2 VIEW; LEFT KNEE - 1-2 VIEW COMPARISON:  03/07/2015 FINDINGS: Left knee: Frontal and lateral views are obtained. The bones are severely osteopenic. No acute  fracture, subluxation, or dislocation. There is severe 3 compartmental osteoarthritis greatest in the patellofemoral compartment. No joint effusion. Diffuse atherosclerosis. Right knee: Frontal and lateral views are obtained. Bones are severely osteopenic. No fracture, subluxation, or dislocation. Severe 3 compartmental osteoarthritis  greatest in the medial and lateral compartments. Trace joint effusion. Diffuse atherosclerosis. IMPRESSION: 1. No acute displaced fractures. 2. Severe bilateral 3 compartmental osteoarthritis. 3. Severe osteopenia. Electronically Signed   By: Randa Ngo M.D.   On: 03/20/2021 16:16   DG Knee 2 Views Right  Result Date: 03/20/2021 CLINICAL DATA:  Bilateral knee pain and swelling, fell EXAM: RIGHT KNEE - 1-2 VIEW; LEFT KNEE - 1-2 VIEW COMPARISON:  03/07/2015 FINDINGS: Left knee: Frontal and lateral views are obtained. The bones are severely osteopenic. No acute fracture, subluxation, or dislocation. There is severe 3 compartmental osteoarthritis greatest in the patellofemoral compartment. No joint effusion. Diffuse atherosclerosis. Right knee: Frontal and lateral views are obtained. Bones are severely osteopenic. No fracture, subluxation, or dislocation. Severe 3 compartmental osteoarthritis greatest in the medial and lateral compartments. Trace joint effusion. Diffuse atherosclerosis. IMPRESSION: 1. No acute displaced fractures. 2. Severe bilateral 3 compartmental osteoarthritis. 3. Severe osteopenia. Electronically Signed   By: Randa Ngo M.D.   On: 03/20/2021 16:16   MR HEEL LEFT WO CONTRAST  Result Date: 03/20/2021 CLINICAL DATA:  Left heel ulceration for several weeks EXAM: MR OF THE LEFT HEEL WITHOUT CONTRAST TECHNIQUE: Multiplanar, multisequence MR imaging of the left calcaneus was performed. No intravenous contrast was administered. COMPARISON:  03/20/2021 FINDINGS: Bones/Joint/Cartilage There is abnormal T2 signal within the superficial aspect of the calcaneus, with overlying cortical irregularity, consistent with osteomyelitis. There is a large overlying soft tissue ulceration. Likely reactive marrow edema within the distal tibia, fibula, and talus consistent with degenerative change. Marked joint space narrowing throughout the hindfoot and ankle. Trace tibiotalar joint effusion. Ligaments  No acute abnormalities. Muscles and Tendons No acute abnormalities. Soft tissues Soft tissue ulceration overlying the calcaneus measuring approximately 1.6 x 2.0 cm in size, extending to the posterior calcaneal cortex. No fluid collection or abscess. There is diffuse subcutaneous edema, most pronounced along the dorsum of the midfoot. IMPRESSION: 1. Large soft tissue ulceration overlying the calcaneus, with underlying calcaneal cortical irregularity and marrow edema consistent with osteomyelitis. 2. Extensive degenerative changes throughout the ankle and hindfoot. 3. Diffuse soft tissue edema.  No fluid collection or abscess. Electronically Signed   By: Randa Ngo M.D.   On: 03/20/2021 23:31   PERIPHERAL VASCULAR CATHETERIZATION  Result Date: 03/21/2021 See surgical note for result.  DG Foot 2 Views Left  Result Date: 03/20/2021 CLINICAL DATA:  Open wound EXAM: LEFT FOOT - 2 VIEW COMPARISON:  None. FINDINGS: Ulcer noted overlying the calcaneus in the posterior heel soft tissues. No underlying bone destruction to suggest osteomyelitis. Diffuse osteopenia. No acute bony abnormality. Specifically, no fracture, subluxation, or dislocation. Calcification in the distal Achilles tendon region, likely related to old injury. Plantar calcaneal spur. Vascular calcifications noted. IMPRESSION: No acute bony abnormality. No evidence of osteomyelitis by plain films. Electronically Signed   By: Rolm Baptise M.D.   On: 03/20/2021 17:23     Assessment and Recommendation  75 y.o. female with known diabetes hypertension chronic kidney disease stage III with diabetic foot ulcer and peripheral vascular disease with irregular heartbeat most consistent with preatrial contractions and no evidence of advanced heart block 1.  Continue supportive care for foot ulcer and need for surgical intervention.  Patient is at lowest risk from  a cardiovascular Standpoint for surgical intervention at this time 2.  No further  intervention and or diagnostic testing from the cardiovascular standpoint with irregular heartbeat most consistent with preatrial contractions 3.  Call if further questions  Signed, Serafina Royals M.D. FACC

## 2021-03-24 NOTE — Progress Notes (Addendum)
Patient has not had anything to eat or drink. Refuses po intake from me and from her son. Tried ensure and she refused as well. She did not take day time meds. MD informed and ordered ivf.

## 2021-03-24 NOTE — Progress Notes (Signed)
PROGRESS NOTE    [name] [name]  KWI:[id number] DOB: 1945/06/28 DOA: 03/20/2021 PCP: Marguerita Merles, MD    Brief Narrative:  75 year old with history of type 2 diabetes, hypertension, CKD stage IV, hypothyroidism, Charcot foot and peripheral arterial disease.  Presenting to the emergency room with left heel ulcer for several weeks.  Patient is mostly bedbound.  She uses wheelchair to get around.  Followed by podiatry as outpatient.  Admitted for surgical intervention.  Assessment & Plan:   Principal Problem:   Foot ulcer due to secondary DM [facility]) Active Problems:   Diabetic foot ulcer ([place])   Diabetic left heel ulcer/peripheral arterial disease/acute osteomyelitis of the left calcaneum: Patient currently on vancomycin and cefepime. Blood cultures negative.  Surgical cultures pending. 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 Seen by vascular surgery, underwent angiogram and stenting.  Patient with extensive comorbidities, she is bedbound with deformity of the foot, now with deep space infection and calcaneal infection which is less likely to heal with prolonged IV antibiotics.  Prolonged antibiotics and persistent infection may be more devastating than getting rid of infection source.She may benefit with left above-knee amputation. Recommended by podiatry and vascular surgery for left above-knee amputation, patient currently unable to make decision.  We will continue on antibiotics. Will discuss with infectious disease on 12/19 about long-term antibiotic management.  Type 2 diabetes: Well-controlled as per patient.  Significant peripheral arterial disease and neuropathy.  Currently on long-acting insulin.   Essential hypertension/chronic diastolic dysfunction/CKD stage IV/hypothyroidism: Fairly stable at baseline.  Acute urinary retention: Secondary to immobility and right groin pain.  Keep Foley catheter until her mobility  improves.  Tachyarrhythmia: Atrial tachycardia.  No evidence of heart block.  Seen by cardiology.  DVT prophylaxis:   Lovenox   Code Status: Full code Family Communication: None today. Disposition Plan: Status is: Inpatient.  Remains inpatient: Surgical procedures planned.  IV antibiotics        Consultants:  Vascular surgery Podiatry  Procedures:  None  Antimicrobials:  Vancomycin and cefepime 12/13---   Subjective:  Patient seen and examined.  She used to be very delightful and full of sense of humor.  Since we started conversation yesterday about amputation, patient is more withdrawn and not talking. Today, in the morning rounds she said she is okay but did not keep up with conversation.  No other overnight events.  Afebrile.  Objective: Vitals:   03/23/21 1850 03/23/21 2047 03/24/21 0421 03/24/21 0741  BP: (!) 142/56 135/61 (!) 135/50 (!) 142/53  Pulse: 63 (!) 53 (!) 48 (!) 101  Resp: 20 16 18 18   Temp:  97.6 F (36.4 C) 98.3 F (36.8 C) 97.8 F (36.6 C)  TempSrc:      SpO2: 100% 97% 100% 97%  Weight:      Height:        Intake/Output Summary (Last 24 hours) at 03/24/2021 1107 Last data filed at 03/24/2021 0430 Gross per 24 hour  Intake 0 ml  Output 1100 ml  Net -1100 ml   Filed Weights   03/20/21 1446  Weight: 90.7 kg    Examination:  General exam: Appears calm and comfortable .Debilitated and chronically sick looking but not in any distress.  Anxious and withdrawn.  Flat affect. Respiratory system: Clear to auscultation. Respiratory effort normal.  No added sounds Cardiovascular system: S1 & S2 heard, RRR.  Gastrointestinal system: soft, non tender  Central nervous system: Alert and oriented. No focal neurological deficits.  Extremities:  Decreased muscle tone.  Contractures of both lower extremities at ankles.  Chronic ischemic changes.  Right foot toe amputation stump clean. Left heel lesion postsurgical dressing.  Not removed by me.  Seen  by surgery. Chronic ischemic changes.     Data Reviewed: I have personally reviewed following labs and imaging studies  CBC: Recent Labs  Lab 03/20/21 1446 03/21/21 0142  WBC 9.1 8.2  NEUTROABS 5.0  --   HGB 10.8* 9.7*  HCT 34.0* 29.9*  MCV 93.2 90.9  PLT 231 096   Basic Metabolic Panel: Recent Labs  Lab 03/20/21 1446 03/21/21 0142 03/22/21 0424  NA 136 136 136  K 4.2 4.1 4.1  CL 109 109 107  CO2 21* 22 20*  GLUCOSE 141* 148* 139*  BUN 33* 34* 30*  CREATININE 1.67* 1.51* 1.49*  CALCIUM 9.1 8.7* 8.7*   GFR: Estimated Creatinine Clearance: 38.3 mL/min (A) (by C-G formula based on SCr of 1.49 mg/dL (H)). Liver Function Tests: Recent Labs  Lab 03/20/21 1446  AST 20  ALT 9  ALKPHOS 87  BILITOT 0.7  PROT 8.5*  ALBUMIN 3.3*   No results for input(s): LIPASE, AMYLASE in the last 168 hours. No results for input(s): AMMONIA in the last 168 hours. Coagulation Profile: No results for input(s): INR, PROTIME in the last 168 hours. Cardiac Enzymes: No results for input(s): CKTOTAL, CKMB, CKMBINDEX, TROPONINI in the last 168 hours. BNP (last 3 results) No results for input(s): PROBNP in the last 8760 hours. HbA1C: No results for input(s): HGBA1C in the last 72 hours.  CBG: Recent Labs  Lab 03/23/21 0742 03/23/21 1224 03/23/21 1654 03/23/21 2054 03/24/21 0737  GLUCAP 106* 108* 140* 121* 88   Lipid Profile: No results for input(s): CHOL, HDL, LDLCALC, TRIG, CHOLHDL, LDLDIRECT in the last 72 hours. Thyroid Function Tests: No results for input(s): TSH, T4TOTAL, FREET4, T3FREE, THYROIDAB in the last 72 hours. Anemia Panel: No results for input(s): VITAMINB12, FOLATE, FERRITIN, TIBC, IRON, RETICCTPCT in the last 72 hours. Sepsis Labs: Recent Labs  Lab 03/20/21 1446  LATICACIDVEN 1.2    Recent Results (from the past 240 hour(s))  Resp Panel by RT-PCR (Flu A&B, Covid) Nasopharyngeal Swab     Status: None   Collection Time: 03/20/21  5:07 PM   Specimen:  Nasopharyngeal Swab; Nasopharyngeal(NP) swabs in vial transport medium  Result Value Ref Range Status   SARS Coronavirus 2 by RT PCR NEGATIVE NEGATIVE Final    Comment: (NOTE) SARS-CoV-2 target nucleic acids are NOT DETECTED.  The SARS-CoV-2 RNA is generally detectable in upper respiratory specimens during the acute phase of infection. The lowest concentration of SARS-CoV-2 viral copies this assay can detect is 138 copies/mL. A negative result does not preclude SARS-Cov-2 infection and should not be used as the sole basis for treatment or other patient management decisions. A negative result may occur with  improper specimen collection/handling, submission of specimen other than nasopharyngeal swab, presence of viral mutation(s) within the areas targeted by this assay, and inadequate number of viral copies(<138 copies/mL). A negative result must be combined with clinical observations, patient history, and epidemiological information. The expected result is Negative.  Fact Sheet for Patients:  EntrepreneurPulse.com.au  Fact Sheet for Healthcare Providers:  IncredibleEmployment.be  This test is no t yet approved or cleared by the [place] FDA and  has been authorized for detection and/or diagnosis of SARS-CoV-2 by FDA under an Emergency Use Authorization (EUA). This EUA will remain  in effect (meaning this test  can be used) for the duration of the COVID-19 declaration under Section 564(b)(1) of the Act, 21 U.S.C.section 360bbb-3(b)(1), unless the authorization is terminated  or revoked sooner.       Influenza A by PCR NEGATIVE NEGATIVE Final   Influenza B by PCR NEGATIVE NEGATIVE Final    Comment: (NOTE) The Xpert Xpress SARS-CoV-2/FLU/RSV plus assay is intended as an aid in the diagnosis of influenza from Nasopharyngeal swab specimens and should not be used as a sole basis for treatment. Nasal washings and aspirates are unacceptable for  Xpert Xpress SARS-CoV-2/FLU/RSV testing.  Fact Sheet for Patients: EntrepreneurPulse.com.au  Fact Sheet for Healthcare Providers: IncredibleEmployment.be  This test is not yet approved or cleared by the [place] FDA and has been authorized for detection and/or diagnosis of SARS-CoV-2 by FDA under an Emergency Use Authorization (EUA). This EUA will remain in effect (meaning this test can be used) for the duration of the COVID-19 declaration under Section 564(b)(1) of the Act, 21 U.S.C. section 360bbb-3(b)(1), unless the authorization is terminated or revoked.  Performed at [facility], [address] [place], [place] [address]   MRSA Next Gen by PCR, Nasal     Status: Abnormal   Collection Time: 03/21/21  7:50 AM   Specimen: Nasal Mucosa; Nasal Swab  Result Value Ref Range Status   MRSA by PCR Next Gen DETECTED (A) NOT DETECTED Final    Comment: RESULT CALLED TO, READ BACK BY AND VERIFIED WITH: SANDY RODRIGUEZ 03/21/21 0954 KLW (NOTE) The GeneXpert MRSA Assay (FDA approved for NASAL specimens only), is one component of a comprehensive MRSA colonization surveillance program. It is not intended to diagnose MRSA infection nor to guide or monitor treatment for MRSA infections. Test performance is not FDA approved in patients less than 52 years old. Performed at [facility], [place], [place] [address]   Aerobic/Anaerobic Culture w Gram Stain (surgical/deep wound)     Status: None (Preliminary result)   Collection Time: 03/22/21  5:07 PM   Specimen: Biopsy  Result Value Ref Range Status   Specimen Description BIOPSY BONE LEFT HEEL  Final   Special Requests NONE  Final   Gram Stain   Final    NO SQUAMOUS EPITHELIAL CELLS SEEN FEW WBC SEEN NO ORGANISMS SEEN    Culture   Final    NO GROWTH < 12 HOURS Performed at [facility], [place] [address] [place], [place] [address]    Report Status  PENDING  Incomplete         Radiology Studies: No results found.      Scheduled Meds:  amLODipine  10 mg Oral Daily   aspirin  81 mg Oral Daily   atorvastatin  40 mg Oral Daily   Chlorhexidine Gluconate Cloth  6 each Topical Q0600   donepezil  10 mg Oral QHS   enoxaparin (LOVENOX) injection  0.5 mg/kg Subcutaneous Q24H   insulin aspart  0-9 Units Subcutaneous TID WC   insulin glargine-yfgn  15 Units Subcutaneous Daily   levothyroxine  50 mcg Oral Daily   mupirocin ointment  1 application Nasal BID   polyethylene glycol  17 g Oral Daily   Continuous Infusions:  ceFEPime (MAXIPIME) IV 2 g (03/23/21 2114)   Vancomycin 750 mg (03/23/21 1342)     LOS: 3 days    Time spent: 30 minutes    Barb Merino, MD [facility] Pager [phone]

## 2021-03-24 NOTE — Progress Notes (Signed)
Subjective: Interval History: none..  Minimally interactive with my questions today.  No family available.  Objective: Vital signs in last 24 hours: Temp:  [97.6 F (36.4 C)-98.5 F (36.9 C)] 97.8 F (36.6 C) (12/17 0741) Pulse Rate:  [48-101] 101 (12/17 0741) Resp:  [16-20] 18 (12/17 0741) BP: (112-142)/(50-64) 142/53 (12/17 0741) SpO2:  [97 %-100 %] 97 % (12/17 0741)  Intake/Output from previous day: 12/16 0701 - 12/17 0700 In: 0  Out: 1100 [Urine:1100] Intake/Output this shift: No intake/output data recorded.  Extremities: Left lower extremity wound dressed, extensive heel breakdown as depicted previously.  Does have some edema.  Lab Results: No results for input(s): WBC, HGB, HCT, PLT in the last 72 hours. BMET Recent Labs    03/22/21 0424  NA 136  K 4.1  CL 107  CO2 20*  GLUCOSE 139*  BUN 30*  CREATININE 1.49*  CALCIUM 8.7*    Studies/Results: DG Knee 2 Views Left  Result Date: 03/20/2021 CLINICAL DATA:  Bilateral knee pain and swelling, fell EXAM: RIGHT KNEE - 1-2 VIEW; LEFT KNEE - 1-2 VIEW COMPARISON:  03/07/2015 FINDINGS: Left knee: Frontal and lateral views are obtained. The bones are severely osteopenic. No acute fracture, subluxation, or dislocation. There is severe 3 compartmental osteoarthritis greatest in the patellofemoral compartment. No joint effusion. Diffuse atherosclerosis. Right knee: Frontal and lateral views are obtained. Bones are severely osteopenic. No fracture, subluxation, or dislocation. Severe 3 compartmental osteoarthritis greatest in the medial and lateral compartments. Trace joint effusion. Diffuse atherosclerosis. IMPRESSION: 1. No acute displaced fractures. 2. Severe bilateral 3 compartmental osteoarthritis. 3. Severe osteopenia. Electronically Signed   By: Randa Ngo M.D.   On: 03/20/2021 16:16   DG Knee 2 Views Right  Result Date: 03/20/2021 CLINICAL DATA:  Bilateral knee pain and swelling, fell EXAM: RIGHT KNEE - 1-2 VIEW; LEFT  KNEE - 1-2 VIEW COMPARISON:  03/07/2015 FINDINGS: Left knee: Frontal and lateral views are obtained. The bones are severely osteopenic. No acute fracture, subluxation, or dislocation. There is severe 3 compartmental osteoarthritis greatest in the patellofemoral compartment. No joint effusion. Diffuse atherosclerosis. Right knee: Frontal and lateral views are obtained. Bones are severely osteopenic. No fracture, subluxation, or dislocation. Severe 3 compartmental osteoarthritis greatest in the medial and lateral compartments. Trace joint effusion. Diffuse atherosclerosis. IMPRESSION: 1. No acute displaced fractures. 2. Severe bilateral 3 compartmental osteoarthritis. 3. Severe osteopenia. Electronically Signed   By: Randa Ngo M.D.   On: 03/20/2021 16:16   MR HEEL LEFT WO CONTRAST  Result Date: 03/20/2021 CLINICAL DATA:  Left heel ulceration for several weeks EXAM: MR OF THE LEFT HEEL WITHOUT CONTRAST TECHNIQUE: Multiplanar, multisequence MR imaging of the left calcaneus was performed. No intravenous contrast was administered. COMPARISON:  03/20/2021 FINDINGS: Bones/Joint/Cartilage There is abnormal T2 signal within the superficial aspect of the calcaneus, with overlying cortical irregularity, consistent with osteomyelitis. There is a large overlying soft tissue ulceration. Likely reactive marrow edema within the distal tibia, fibula, and talus consistent with degenerative change. Marked joint space narrowing throughout the hindfoot and ankle. Trace tibiotalar joint effusion. Ligaments No acute abnormalities. Muscles and Tendons No acute abnormalities. Soft tissues Soft tissue ulceration overlying the calcaneus measuring approximately 1.6 x 2.0 cm in size, extending to the posterior calcaneal cortex. No fluid collection or abscess. There is diffuse subcutaneous edema, most pronounced along the dorsum of the midfoot. IMPRESSION: 1. Large soft tissue ulceration overlying the calcaneus, with underlying  calcaneal cortical irregularity and marrow edema consistent with osteomyelitis. 2. Extensive degenerative changes  throughout the ankle and hindfoot. 3. Diffuse soft tissue edema.  No fluid collection or abscess. Electronically Signed   By: Randa Ngo M.D.   On: 03/20/2021 23:31   PERIPHERAL VASCULAR CATHETERIZATION  Result Date: 03/21/2021 See surgical note for result.  DG Foot 2 Views Left  Result Date: 03/20/2021 CLINICAL DATA:  Open wound EXAM: LEFT FOOT - 2 VIEW COMPARISON:  None. FINDINGS: Ulcer noted overlying the calcaneus in the posterior heel soft tissues. No underlying bone destruction to suggest osteomyelitis. Diffuse osteopenia. No acute bony abnormality. Specifically, no fracture, subluxation, or dislocation. Calcification in the distal Achilles tendon region, likely related to old injury. Plantar calcaneal spur. Vascular calcifications noted. IMPRESSION: No acute bony abnormality. No evidence of osteomyelitis by plain films. Electronically Signed   By: Rolm Baptise M.D.   On: 03/20/2021 17:23   Anti-infectives: Anti-infectives (From admission, onward)    Start     Dose/Rate Route Frequency Ordered Stop   03/22/21 0000  ceFAZolin (ANCEF) IVPB 1 g/50 mL premix  Status:  Discontinued       Note to Pharmacy: To be given in specials   1 g 100 mL/hr over 30 Minutes Intravenous 60 min pre-op 03/21/21 1545 03/21/21 1724   03/21/21 1607  ceFAZolin (ANCEF) IVPB 1 g/50 mL premix  Status:  Discontinued        over 30 Minutes  Continuous PRN 03/21/21 1640 03/21/21 1724   03/21/21 1200  vancomycin (VANCOREADY) IVPB 750 mg/150 mL  Status:  Discontinued        750 mg 150 mL/hr over 60 Minutes Intravenous Every 24 hours 03/21/21 1008 03/21/21 1022   03/21/21 1200  vancomycin (VANCOCIN) IVPB 750 mg/150 ml premix        750 mg 150 mL/hr over 60 Minutes Intravenous Every 24 hours 03/21/21 1026     03/21/21 1100  ceFEPIme (MAXIPIME) 2 g in sodium chloride 0.9 % 100 mL IVPB        2 g 200  mL/hr over 30 Minutes Intravenous Every 12 hours 03/21/21 1002     03/20/21 1645  ceFEPIme (MAXIPIME) 2 g in sodium chloride 0.9 % 100 mL IVPB        2 g 200 mL/hr over 30 Minutes Intravenous  Once 03/20/21 1639 03/20/21 1756   03/20/21 1645  vancomycin (VANCOCIN) IVPB 1000 mg/200 mL premix        1,000 mg 200 mL/hr over 60 Minutes Intravenous  Once 03/20/21 1639 03/20/21 1912       Assessment/Plan: s/p Procedure(s): IRRIGATION AND DEBRIDEMENT FOOT AND BONE BIOPSY (Left)  Reviewed all notes with regard to recent attempt at reestablishing adequate vascular perfusion, and podiatry note from yesterday with extensive osteomyelitis and skin breakdown in this nonambulatory patient.  Broached the topic today of above-knee amputation, patient does not seem terribly receptive or interactive today.  No family available.  If family available later today, I would be happy to discuss this with them, given her nonambulatory status and the level of amputation required to definitively allow successful healing, as preservation of the knee does not appear to be appropriate in the setting     LOS: 3 days   [name] [name] 03/24/2021, 9:36 AM

## 2021-03-24 NOTE — Plan of Care (Signed)

## 2021-03-24 NOTE — Progress Notes (Signed)
Subjective: POD #2 s/p left foot wound debridement with Dr. 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  At the time below-knee amputation was recommended vascular surgery consulted who saw the patient this morning.  Patient is not talking this morning.  I did talk to the patient's son over the phone who states that she is not ready to proceed with amputation.  Objective:  Vitals:   03/24/21 0421 03/24/21 0741  BP: (!) 135/50 (!) 142/53  Pulse: (!) 48 (!) 101  Resp: 18 18  Temp: 98.3 F (36.8 C) 97.8 F (36.6 C)  SpO2: 100% 97%    NAD DP/PT pulses palpable bilaterally, CRT less than 3 seco large full-thickness to the bone present to posterior aspect left heel there is exposed bone with granular tissue.  There is no purulence noted today.  There was some bleeding present.  Mild discomfort to palpation. On the right foot foot deformities present with preulcerative area in the hallux without any open sores. No pain with calf compression, swelling, warmth, erythema  Assessment: Postop day #2 status post left heel wound debridement, osteomyelitis calcaneus  Plan: Patient did not engage in conversation today so I called the patient's son, [name].  Upon discussion with him he states that his mom is not ready to proceed with amputation.  We discussed amputation versus prolonged antibiotics and we discussed risks and benefits of each.  At this point they want to proceed with antibiotics and hold off on amputation.  Discussed without proper source control potential for spread of infection.  He states that if anything were to change or worsen they would agree to amputation but at this point she is not ready. Recommend infectious disease consultation.  Continue with daily dressing change with Betadine wet-to-dry for the heel.  Celesta Gentile, DPM

## 2021-03-25 ENCOUNTER — Inpatient Hospital Stay: Payer: Medicare Other

## 2021-03-25 DIAGNOSIS — I471 Supraventricular tachycardia: Secondary | ICD-10-CM

## 2021-03-25 DIAGNOSIS — M869 Osteomyelitis, unspecified: Secondary | ICD-10-CM

## 2021-03-25 DIAGNOSIS — G9341 Metabolic encephalopathy: Secondary | ICD-10-CM

## 2021-03-25 DIAGNOSIS — G253 Myoclonus: Secondary | ICD-10-CM

## 2021-03-25 DIAGNOSIS — R338 Other retention of urine: Secondary | ICD-10-CM

## 2021-03-25 LAB — CBC
HCT: 29.8 % — ABNORMAL LOW (ref 36.0–46.0)
Hemoglobin: 9.5 g/dL — ABNORMAL LOW (ref 12.0–15.0)
MCH: 29.5 pg (ref 26.0–34.0)
MCHC: 31.9 g/dL (ref 30.0–36.0)
MCV: 92.5 fL (ref 80.0–100.0)
Platelets: 212 10*3/uL (ref 150–400)
RBC: 3.22 MIL/uL — ABNORMAL LOW (ref 3.87–5.11)
RDW: 13.7 % (ref 11.5–15.5)
WBC: 9 10*3/uL (ref 4.0–10.5)
nRBC: 0 % (ref 0.0–0.2)

## 2021-03-25 LAB — BLOOD GAS, VENOUS
Acid-base deficit: 10 mmol/L — ABNORMAL HIGH (ref 0.0–2.0)
Bicarbonate: 14.9 mmol/L — ABNORMAL LOW (ref 20.0–28.0)
O2 Saturation: 77.5 %
Patient temperature: 37
pCO2, Ven: 29 mmHg — ABNORMAL LOW (ref 44.0–60.0)
pH, Ven: 7.32 (ref 7.250–7.430)
pO2, Ven: 46 mmHg — ABNORMAL HIGH (ref 32.0–45.0)

## 2021-03-25 LAB — COMPREHENSIVE METABOLIC PANEL
ALT: 7 U/L (ref 0–44)
AST: 12 U/L — ABNORMAL LOW (ref 15–41)
Albumin: 2.6 g/dL — ABNORMAL LOW (ref 3.5–5.0)
Alkaline Phosphatase: 66 U/L (ref 38–126)
Anion gap: 9 (ref 5–15)
BUN: 32 mg/dL — ABNORMAL HIGH (ref 8–23)
CO2: 17 mmol/L — ABNORMAL LOW (ref 22–32)
Calcium: 8.9 mg/dL (ref 8.9–10.3)
Chloride: 114 mmol/L — ABNORMAL HIGH (ref 98–111)
Creatinine, Ser: 1.81 mg/dL — ABNORMAL HIGH (ref 0.44–1.00)
GFR, Estimated: 29 mL/min — ABNORMAL LOW (ref >60)
Glucose, Bld: 80 mg/dL (ref 70–99)
Potassium: 4.5 mmol/L (ref 3.5–5.1)
Sodium: 140 mmol/L (ref 135–145)
Total Bilirubin: 1 mg/dL (ref 0.3–1.2)
Total Protein: 7.8 g/dL (ref 6.5–8.1)

## 2021-03-25 LAB — CREATININE, SERUM
Creatinine, Ser: 1.78 mg/dL — ABNORMAL HIGH (ref 0.44–1.00)
GFR, Estimated: 30 mL/min — ABNORMAL LOW (ref >60)

## 2021-03-25 LAB — AMMONIA: Ammonia: 13 umol/L (ref 9–35)

## 2021-03-25 LAB — GLUCOSE, CAPILLARY
Glucose-Capillary: 88 mg/dL (ref 70–99)
Glucose-Capillary: 90 mg/dL (ref 70–99)
Glucose-Capillary: 104 mg/dL — ABNORMAL HIGH (ref 70–99)
Glucose-Capillary: 111 mg/dL — ABNORMAL HIGH (ref 70–99)
Glucose-Capillary: 112 mg/dL — ABNORMAL HIGH (ref 70–99)

## 2021-03-25 LAB — VITAMIN B12: Vitamin B-12: 264 pg/mL (ref 180–914)

## 2021-03-25 LAB — FOLATE: Folate: 27 ng/mL (ref >5.9)

## 2021-03-25 LAB — VANCOMYCIN, TROUGH: Vancomycin Tr: 31 ug/mL (ref 15–20)

## 2021-03-25 MED ORDER — INSULIN ASPART 100 UNIT/ML IJ SOLN
0.0000 [IU] | INTRAMUSCULAR | Status: DC
  Administered 2021-03-28 – 2021-03-31 (×7): 1 [IU] via SUBCUTANEOUS
  Filled 2021-03-25 (×7): qty 1

## 2021-03-25 MED ORDER — METRONIDAZOLE 500 MG PO TABS
500.0000 mg | ORAL_TABLET | Freq: Two times a day (BID) | ORAL | Status: DC
  Filled 2021-03-25: qty 1

## 2021-03-25 MED ORDER — LACTATED RINGERS IV SOLN: INTRAVENOUS | Status: DC

## 2021-03-25 MED ORDER — PIPERACILLIN-TAZOBACTAM 3.375 G IVPB
3.3750 g | Freq: Three times a day (TID) | INTRAVENOUS | Status: DC
  Administered 2021-03-25 – 2021-03-27 (×6): 3.375 g via INTRAVENOUS
  Filled 2021-03-25 (×6): qty 50

## 2021-03-25 MED ORDER — VANCOMYCIN VARIABLE DOSE PER UNSTABLE RENAL FUNCTION (PHARMACIST DOSING): Status: DC

## 2021-03-25 MED ORDER — INSULIN GLARGINE-YFGN 100 UNIT/ML ~~LOC~~ SOLN
5.0000 [IU] | Freq: Every day | SUBCUTANEOUS | Status: DC
  Administered 2021-03-26: 09:00:00 5 [IU] via SUBCUTANEOUS
  Filled 2021-03-25 (×3): qty 0.05

## 2021-03-25 MED ORDER — CHLORHEXIDINE GLUCONATE CLOTH 2 % EX PADS
6.0000 | MEDICATED_PAD | Freq: Every day | CUTANEOUS | Status: DC
  Administered 2021-03-25 – 2021-03-31 (×7): 6 via TOPICAL

## 2021-03-25 NOTE — Assessment & Plan Note (Addendum)
Seen by cards (12/17 - noted irregular HR most c/w preatrial contractions, no evidence of advanced heart block)

## 2021-03-25 NOTE — Assessment & Plan Note (Signed)
lipitor

## 2021-03-25 NOTE — Consult Note (Signed)
NEURO HOSPITALIST CONSULT NOTE   Requestig physician: Dr. Florene Glen  Reason for Consult: Jerking movements and AMS  History obtained from:   Chart     HPI:                                                                                                                                          [name] [name] is an 75 y.o. female with a PMHx of chronic diastolic HF, CKD4, Charcot foot, anemia, DM, HLD, HTN, hypothyroidism and prior toe amputation who presented to the hospital on 12/13 with a several-week history of worsening diabetic left heel ulcer that progressed to the point that it was draining purulent material. She was admitted for debridement and possible endovascular intervention. She was started on IV antibiotics and Podiatry was consulted in addition to Vascular Surgery. 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 BKA was then recommended, but patient has so far not been receptive to this option.   Today, the patient was noted to have jerking movements of her extremities and also was not engaging in conversation with providers. Neurology was consulted for AMS and possible seizure activity.   Past Medical History:  Diagnosis Date   Chronic diastolic CHF (congestive heart failure), NYHA class 3 ([place]) 06/30/2019   Diabetes mellitus without complication ([place])    Humerus fracture 04/20/2018   left   Hyperlipidemia    Hypertension    Hypothyroidism    Vertigo    several yrs ago   Wears dentures    partial upper and lower (loose)    Past Surgical History:  Procedure Laterality Date   AMPUTATION TOE Right 05/31/2020   Procedure: AMPUTATION TOE;  Surgeon: Caroline More, DPM;  Location: [facility];  Service: Podiatry;  Laterality: Right;   CATARACT EXTRACTION W/PHACO Left 12/01/2018   Procedure: CATARACT EXTRACTION PHACO AND INTRAOCULAR LENS PLACEMENT ([place]) LEFT DIABETIC;  Surgeon: Birder Robson,  MD;  Location: [place];  Service: Ophthalmology;  Laterality: Left;  Diabetic - insulin   COLONOSCOPY WITH PROPOFOL N/A 07/07/2019   Procedure: COLONOSCOPY WITH PROPOFOL;  Surgeon: Robert Bellow, MD;  Location: [facility];  Service: Endoscopy;  Laterality: N/A;   ESOPHAGOGASTRODUODENOSCOPY (EGD) WITH PROPOFOL N/A 07/07/2019   Procedure: ESOPHAGOGASTRODUODENOSCOPY (EGD) WITH PROPOFOL;  Surgeon: Robert Bellow, MD;  Location: [facility];  Service: Endoscopy;  Laterality: N/A;   FOOT SURGERY Right    lesion excision   IRRIGATION AND DEBRIDEMENT FOOT Left 03/22/2021   Procedure: IRRIGATION AND DEBRIDEMENT FOOT AND BONE BIOPSY;  Surgeon: Felipa Furnace, DPM;  Location: [facility];  Service: Podiatry;  Laterality: Left;   LOWER EXTREMITY ANGIOGRAPHY Right 05/19/2020   Procedure: Lower Extremity Angiography;  Surgeon: Katha Cabal,  MD;  Location: [place] [facility];  Service: Cardiovascular;  Laterality: Right;   LOWER EXTREMITY ANGIOGRAPHY Right 05/30/2020   Procedure: Lower Extremity Angiography;  Surgeon: Katha Cabal, MD;  Location: [place] [facility];  Service: Cardiovascular;  Laterality: Right;  Pedal Approach   LOWER EXTREMITY ANGIOGRAPHY Left 03/21/2021   Procedure: Lower Extremity Angiography;  Surgeon: Algernon Huxley, MD;  Location: [place] [facility];  Service: Cardiovascular;  Laterality: Left;    Family History  Problem Relation Age of Onset   Breast cancer Neg Hx              Social History:  reports that she has never smoked. She has never used smokeless tobacco. She reports that she does not drink alcohol and does not use drugs.  No Known Allergies  MEDICATIONS:                                                                                                                     Scheduled:  amLODipine  10 mg Oral Daily   aspirin  81 mg Oral Daily   atorvastatin  40 mg Oral Daily   Chlorhexidine Gluconate Cloth  6 each Topical Daily    donepezil  10 mg Oral QHS   enoxaparin (LOVENOX) injection  0.5 mg/kg Subcutaneous Q24H   insulin aspart  0-6 Units Subcutaneous Q4H   [START ON 03/26/2021] insulin glargine-yfgn  5 Units Subcutaneous Daily   levothyroxine  50 mcg Oral Daily   mupirocin ointment  1 application Nasal BID   polyethylene glycol  17 g Oral Daily   vancomycin variable dose per unstable renal function (pharmacist dosing)   Does not apply See admin instructions   Continuous:  lactated ringers 100 mL/hr at 03/25/21 1900   piperacillin-tazobactam (ZOSYN)  IV 3.375 g (03/25/21 1358)     ROS:                                                                                                                                       Unable to obtain due to altered mental status.    Blood pressure (!) 142/46, pulse 70, temperature 97.8 F (36.6 C), resp. rate 18, height 5\' 7"  (1.702 m), weight 90.7 kg, SpO2 99 %.   General Examination:  Physical Exam  HEENT-  [place]/AT. No nuchal rigidity.   Lungs- Respirations unlabored Extremities- Decreased muscle bulk to BLE. Dressing to left foot.    Neurological Examination Mental Status: Initially asleep, the patient awakens to a verbally unresponsive state to light noxious. The patient is mute with no verbal output or moaning to any stimuli. No attempts to nonverbally communicate. She only fixates briefly on examiner once, when he passively bends lower extremities, which also results in patient with brief pained facial expression. Does not follow any verbal or pantomimed commands. Does not respond to her name.  Cranial Nerves: II: Blinks to threat bilaterally, less consistently in left eye than right. PERRL.   III,IV, VI: Eyes are conjugate near the midline. Does not look towards or away from visual stimuli but will attempt to close eyes to penlight.  V: Grimaces to brow ridge  pressure bilaterally VII: Grimace is symmetric VIII: No response to voice IX,X: Unable to visualize palate XI: Head tends to rotate preferentially to the left.  XII: Does not protrude tongue Motor/Sensory: RUE with coarse intermittent increases in tone when moved passively by examiner, with a myoclonic-like quallity.  LUE with similar jerking movements as RUE, but more forceful.  With passive elevation and release, each upper extremity falls to the bed with minimal effort against gravity; right falls more quickly than left.  With sternal rub, there is less movement of RUE relative to LUE. Both move semipurposefully toward sternum but no attempt is made by the patient to remove the stimulus.  BLE: Minimal internal/external rotation movements to noxious pinch bilaterally. Does not flex at knees or hips to noxious. Deferred plantar stimulation.  Deep Tendon Reflexes: 3+ bilateral brachioradialis and biceps. 0 bilateral patellae.  Plantars: Deferred due to wound.  Cerebellar: Unable to assess.  Gait: Unable to assess.    Lab Results: Basic Metabolic Panel: Recent Labs  Lab 03/20/21 1446 03/21/21 0142 03/22/21 0424 03/25/21 0416  NA 136 136 136 140  K 4.2 4.1 4.1 4.5  CL 109 109 107 114*  CO2 21* 22 20* 17*  GLUCOSE 141* 148* 139* 80  BUN 33* 34* 30* 32*  CREATININE 1.67* 1.51* 1.49* 1.81*   1.78*  CALCIUM 9.1 8.7* 8.7* 8.9    CBC: Recent Labs  Lab 03/20/21 1446 03/21/21 0142 03/25/21 0416  WBC 9.1 8.2 9.0  NEUTROABS 5.0  --   --   HGB 10.8* 9.7* 9.5*  HCT 34.0* 29.9* 29.8*  MCV 93.2 90.9 92.5  PLT 231 216 212    Cardiac Enzymes: No results for input(s): CKTOTAL, CKMB, CKMBINDEX, TROPONINI in the last 168 hours.  Lipid Panel: No results for input(s): CHOL, TRIG, HDL, CHOLHDL, VLDL, LDLCALC in the last 168 hours.  Imaging: CT HEAD WO CONTRAST (5MM)  Result Date: 03/25/2021 CLINICAL DATA:  [place]. EXAM: CT HEAD WITHOUT CONTRAST TECHNIQUE: Contiguous axial images  were obtained from the base of the skull through the vertex without intravenous contrast. COMPARISON:  CT head 11/15/2019 FINDINGS: Brain: No evidence of acute infarction, hemorrhage, hydrocephalus, extra-axial collection or mass lesion/mass effect. 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 Generalized parenchymal volume loss with associated enlargement of the ventricles. Hypodensities in the periventricular and subcortical white matter, consistent with chronic small-vessel ischemic changes. Vascular: No hyperdense vessel or unexpected calcification. Skull: Normal. Negative for fracture or focal lesion. Sinuses/Orbits: No acute finding. Trace mucosal thickening in the  maxillary sinuses. Other: None. IMPRESSION: No acute intracranial abnormality. Electronically Signed   By: Ileana Roup M.D.   On: 03/25/2021 12:56     Assessment:  75 y.o. female with a PMHx of chronic diastolic HF, CKD4, Charcot foot, anemia, DM, HLD, HTN, hypothyroidism and prior toe amputation who presented to the hospital on 12/13 with a several-week history of worsening diabetic left heel ulcer that progressed to the point that it was draining purulent material. She was admitted for debridement and possible endovascular intervention. She was started on IV antibiotics and Podiatry was consulted in addition to Vascular Surgery. 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 BKA was then recommended, but patient has so far not been receptive to this option. Today, the patient was noted to have jerking movements of her extremities and also was not engaging in conversation with providers. Neurology was consulted for AMS and possible seizure activity.  1. Exam reveals myoclonic-like changes in tone to BUE triggered consistently by passive movement. BLE with  decreased muscle bulk, absent patellar reflexes and increased extensor tone. No spontaneous myoclonus noted during bedside exam.  2. AMS on exam appears most likely to be secondary to diffuse toxic or metabolic encephalopathy.  3. Increased tone of BUE to movement suggestive of either asterixis or myoclonus. Of note, antibiotic-induced myoclonus mainly occurs in association with high or toxic doses of antibiotics and/or underlying renal disease. Cefepime is one of the antibiotics with relatively high neurotoxic potential. Per literature search, neurotoxicity from cefepime is attributed to its ability to cross the blood-brain barrier and manifest concentration-dependent gamma-aminobutyric acid antagonism. Neurotoxic symptoms include depressed consciousness, encephalopathy, aphasia, myoclonus, seizures, and coma. 4. No neck stiffness or fever to suggest a meningitis.   Recommendations: 1. Agree with stopping cefepime 2. EEG in the AM (ordered) 3. MRI brain   Electronically signed: Dr. Kerney Elbe 03/25/2021, 2:24 PM

## 2021-03-25 NOTE — Plan of Care (Signed)

## 2021-03-25 NOTE — Progress Notes (Signed)
Subjective: POD #3 s/p left foot wound debridement with Dr. 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  At the time below-knee amputation was recommended vascular surgery consulted for amputation but the patient has not been receptive to this.   Objective: Vitals:   03/25/21 0504 03/25/21 0815  BP: (!) 138/53 (!) 123/43  Pulse: 67 67  Resp: 18 18  Temp: 98.9 F (37.2 C) 98.4 F (36.9 C)  SpO2: 98% 98%    NAD DP/PT pulses palpable bilaterally, CRT less than 3 seco large  Full-thickness ulceration present to the posterior aspect left heel with exposed bone.  There is some edema to the foot there is no significant erythema.  There is no drainage or pus and no fluctuation or crepitation.  There is no significant malodor.  There is a preulcerative area in the dorsal aspect of the foot likely from the bandage, pressure.  There is no skin breakdown otherwise to the left foot. No pain with calf compression, swelling, warmth, erythema  Assessment: Postop day #3 status post left heel wound debridement, osteomyelitis calcaneus  Plan: WBC 9, afebrile.  Dressing was changed today. Patient did not engage in the conversation today. Per the RN since she has been told about the amputation she has not engaged in conversation. Betadine wet-to-dry dressing was applied.  Padding was applied to the dorsal aspect of foot to avoid any excess pressure.  Monitor the dorsal foot.  Patient still not open to amputation.  Discussed with the patient's son over the phone today.  We will plan for infectious disease consultation for antibiotic recommendations. I also did tell the patient about this plan today and she shrugged her shoulders and did acknowledge this. Upon discharge will need home health care for dressing changes and will likely follow-up with the wound care center.  Celesta Gentile, DPM

## 2021-03-25 NOTE — Assessment & Plan Note (Addendum)
Minimally interactive since 12/17 Mildly improved today, able to respond with short phrases/individual words, continued abnormal movements of head, less myoclonic jerks noted on my exam Concern for encephalopathy related to cefepime    Head CT without acute findings EEG moderate diffuse slowing, abnormal pattern on ictal interictal continuum, concerning for nonconvulsive seizure activity Repeat EEG 12/20 pending MRI without acute findings Ammonia wnl, b12 low normal, follow MMA (will give x1 IM), folate wnl, follow TSH (pending) Neurology c/s, appreciate recs - keppra, repeat EEG

## 2021-03-25 NOTE — Assessment & Plan Note (Addendum)
Seems close to baseline, fluctuating here - up today, but still not far from baseline (fluctuates between 1.6-2 over past year) Metabolic acidosis resolved, transition to LR Adequate UOP

## 2021-03-25 NOTE — Assessment & Plan Note (Addendum)
S/p foley Consider trial of void

## 2021-03-25 NOTE — Plan of Care (Signed)

## 2021-03-25 NOTE — Progress Notes (Addendum)
Pharmacy Antibiotic Note  [name] [name] is a 75 y.o. female admitted on 03/20/2021 with  DM foot infection/Osteomyelitis .  Pharmacy has been consulted for vancomycin and Zosyn dosing.  Patient is s/p irrigation and debridement and bone biopsy of left foot wound on 12/15. Has OM in the calcaneus and medullary canal. Vascular consulted for BKA but patient unwilling to proceed at this point. Plan is for ID consult for antimicrobial recommendations  Scr with slight bump today. Hyperchloremic NAGMA noted on BMP. Likely secondary to normal saline versus worsening renal function from vancomycin  PK at SS: Vpk: 40 (drawn ~1 hr after completion of infusion) Vtr: 31 (drawn ~ 3 hr early) AUC: 834, Cmax 40.7, Cmin 29.4, t1/2 49h  Plan:  Zosyn 3.375 g IV q8h (4-hr infusion)  Stop vancomycin 750 mg IV q24h --Dose per levels for now, check a level tomorrow AM --Un-clear how useful levels are at this time given possible unstable renal function. Will depend if creatinine is stable tomorrow --Anticipate starting new maintenance regimen of 1000 mg q48h once vancomycin level < 20 mcg/mL if renal function is stable   Height: 5\' 7"  (170.2 cm) Weight: 90.7 kg (200 lb) IBW/kg (Calculated) : 61.6  Temp (24hrs), Avg:98.3 F (36.8 C), Min:97.7 F (36.5 C), Max:98.9 F (37.2 C)  Recent Labs  Lab 03/20/21 1446 03/21/21 0142 03/22/21 0424 03/24/21 1647 03/25/21 0416 03/25/21 1050  WBC 9.1 8.2  --   --  9.0  --   CREATININE 1.67* 1.51* 1.49*  --  1.81*   1.78*  --   LATICACIDVEN 1.2  --   --   --   --   --   VANCOTROUGH  --   --   --   --   --  31*  VANCOPEAK  --   --   --  40  --   --      Estimated Creatinine Clearance: 32 mL/min (A) (by C-G formula based on SCr of 1.78 mg/dL (H)).    No Known Allergies  Antimicrobials this admission: Vancomycin 12/13 >> Cefepime 12/13 >> 12/18 Zosyn 12/18 >>  Dose adjustments this admission: See above  Microbiology results: 12/14 MRSA PCR: + 12/15  Bone cx: SA, E faecalis, pending  Thank you for allowing pharmacy to be a part of this patients care.  Benita Gutter 03/25/2021 11:37 AM

## 2021-03-25 NOTE — Assessment & Plan Note (Addendum)
Hold basal dose, q4 SSI while encephalopathic

## 2021-03-25 NOTE — Progress Notes (Signed)
Discussed once again today the issue about need for definitive surgery to deal with her left foot problem.  She is nonambulatory, thus an above-knee amputation would be the best position for amputation.  She remains relatively unreceptive to this option.  No family available to talk to her directly.  We will tentatively make her n.p.o. after midnight for potential procedure tomorrow.

## 2021-03-25 NOTE — Assessment & Plan Note (Addendum)
MRI 12/13 with large soft tissue ulceration overlying calcaneus with findings c/w osteomyelitis S/p angiogram 12/14 with percutaneous transluminal angioplasty of L peroneal artery and TP trunk (aspirin PR until she's taking PO safely) S/p I&D of foot on 12/15 with bone bx None bx from 12/15 with MRSA (vancomycin) and e faecalis (sensitive to vanc/ampicillin)  Cefepime d/c'd due to encephalopathy.  Abx per ID (appears zosyn has been d/c'd).  Plan for dapto. C/s ID.  Appreciate vascular, podiatry, and ID input.

## 2021-03-25 NOTE — Progress Notes (Addendum)
PROGRESS NOTE    [name] [name]  SWF:[id number] DOB: 04/23/1945 DOA: 03/20/2021 PCP: Marguerita Merles, MD  Chief Complaint  Patient presents with   Wound Infection    Brief Narrative:  75 year old with history of type 2 diabetes, hypertension, CKD stage IV, hypothyroidism, Charcot foot and peripheral arterial disease.  Presenting to the emergency room with left heel ulcer for several weeks.  Patient is mostly bedbound.  She uses wheelchair to get around.  Followed by podiatry as outpatient.  Admitted for surgical intervention.    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  Head CT without acute findings EEG pending Will plan for MRI Ammonia, b12, folate wnl Discussed with neurology who will see her today  Osteomyelitis [facility]) As above  Diabetic foot ulcer ([place]) MRI 12/13 with large soft tissue ulceration overlying calcaneus with findings c/w osteomyelitis S/p angiogram 12/14 with percutaneous transluminal angioplasty of L peroneal artery and TP trunk  S/p I&D of foot on 12/15 with bone bx None bx from 12/15 with staph aureus, rare e. Faecalis - pending susceptibilities Continue vanc/zosyn.  Cefepime d/c'd due to encephalopathy.  Vanc dosing per pharmacy - currently on hold, high. C/s ID.  Appreciate vascular and ID input.  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  1. Ultrasound guidance for vascular access right femoral artery             2.  Catheter placement into left SFA from right femoral approach             3.  Aortogram and selective left lower extremity angiogram             4.  Percutaneous transluminal angioplasty of left peroneal artery and TP trunk with 2.5 mm balloon proximally and 2 mm balloon distally             5. StarClose closure device right femoral artery Antimicrobials:  Anti-infectives (From admission, onward)    Start     Dose/Rate Route Frequency Ordered Stop   03/25/21 1400  piperacillin-tazobactam (ZOSYN) IVPB 3.375 g        3.375 g 12.5 mL/hr over 240 Minutes Intravenous Every 8 hours 03/25/21 1158     03/25/21 1130  vancomycin variable dose per unstable renal function (pharmacist dosing)  Does not apply See admin instructions 03/25/21 1130     03/25/21 0830  metroNIDAZOLE (FLAGYL) tablet 500 mg  Status:  Discontinued        500 mg Oral Every 12 hours 03/25/21 0800 03/25/21 1157   03/22/21 0000  ceFAZolin (ANCEF) IVPB 1 g/50 mL premix  Status:  Discontinued       Note to Pharmacy: To be given in specials   1 g 100 mL/hr over 30 Minutes Intravenous 60 min pre-op 03/21/21 1545 03/21/21 1724   03/21/21 1607  ceFAZolin (ANCEF) IVPB 1 g/50 mL premix   Status:  Discontinued        over 30 Minutes  Continuous PRN 03/21/21 1640 03/21/21 1724   03/21/21 1200  vancomycin (VANCOREADY) IVPB 750 mg/150 mL  Status:  Discontinued        750 mg 150 mL/hr over 60 Minutes Intravenous Every 24 hours 03/21/21 1008 03/21/21 1022   03/21/21 1200  vancomycin (VANCOCIN) IVPB 750 mg/150 ml premix  Status:  Discontinued        750 mg 150 mL/hr over 60 Minutes Intravenous Every 24 hours 03/21/21 1026 03/25/21 1130   03/21/21 1100  ceFEPIme (MAXIPIME) 2 g in sodium chloride 0.9 % 100 mL IVPB  Status:  Discontinued        2 g 200 mL/hr over 30 Minutes Intravenous Every 12 hours 03/21/21 1002 03/25/21 1116   03/20/21 1645  ceFEPIme (MAXIPIME) 2 g in sodium chloride 0.9 % 100 mL IVPB        2 g 200 mL/hr over 30 Minutes Intravenous  Once 03/20/21 1639 03/20/21 1756   03/20/21 1645  vancomycin (VANCOCIN) IVPB 1000 mg/200 mL premix        1,000 mg 200 mL/hr over 60 Minutes Intravenous  Once 03/20/21 1639 03/20/21 1912       Subjective: unresponisve  Objective: Vitals:   03/25/21 0504 03/25/21 0815 03/25/21 1124 03/25/21 1526  BP: (!) 138/53 (!) 123/43 (!) 142/46 (!) 136/59  Pulse: 67 67 70 (!) 49  Resp: 18 18 18 18   Temp: 98.9 F (37.2 C) 98.4 F (36.9 C) 97.8 F (36.6 C) 98.8 F (37.1 C)  TempSrc:      SpO2: 98% 98% 99% 99%  Weight:      Height:        Intake/Output Summary (Last 24 hours) at 03/25/2021 1844 Last data filed at 03/25/2021 0507 Gross per 24 hour  Intake 1156.43 ml  Output 700 ml  Net 456.43 ml   Filed Weights   03/20/21 1446  Weight: 90.7 kg    Examination:  General: No acute distress. Cardiovascular: RRR Lungs: unlabored Abdomen: Soft, nontender, nondistended  Neurological: unresponsive, with sternal rub she opens eyes and turns head/attention towards me, appears to try to mouth words, but doesn't speak - tries to push me away, but barely reaches my arm, myoclonic jerks noted - quickly closes her eyes and is  unresponsive again after stimulation stops.  PERRL  Extremities: prafo boots, dressing in place   Data Reviewed: I have personally reviewed following labs and imaging studies  CBC: Recent Labs  Lab 03/20/21 1446 03/21/21 0142 03/25/21 0416  WBC 9.1 8.2 9.0  NEUTROABS 5.0  --   --   HGB 10.8* 9.7* 9.5*  HCT 34.0* 29.9* 29.8*  MCV 93.2 90.9 92.5  PLT 231 216 517    Basic Metabolic Panel: Recent Labs  Lab 03/20/21 1446 03/21/21 0142 03/22/21 0424 03/25/21 0416  NA 136 136 136  140  K 4.2 4.1 4.1 4.5  CL 109 109 107 114*  CO2 21* 22 20* 17*  GLUCOSE 141* 148* 139* 80  BUN 33* 34* 30* 32*  CREATININE 1.67* 1.51* 1.49* 1.81*   1.78*  CALCIUM 9.1 8.7* 8.7* 8.9    GFR: Estimated Creatinine Clearance: 32 mL/min (Jaysun Wessels) (by C-G formula based on SCr of 1.78 mg/dL (H)).  Liver Function Tests: Recent Labs  Lab 03/20/21 1446 03/25/21 0416  AST 20 12*  ALT 9 7  ALKPHOS 87 66  BILITOT 0.7 1.0  PROT 8.5* 7.8  ALBUMIN 3.3* 2.6*    CBG: Recent Labs  Lab 03/24/21 1526 03/24/21 2138 03/25/21 0818 03/25/21 1129 03/25/21 1526  GLUCAP 108* 89 88 90 104*     Recent Results (from the past 240 hour(s))  Resp Panel by RT-PCR (Flu Heman Que&B, Covid) Nasopharyngeal Swab     Status: None   Collection Time: 03/20/21  5:07 PM   Specimen: Nasopharyngeal Swab; Nasopharyngeal(NP) swabs in vial transport medium  Result Value Ref Range Status   SARS Coronavirus 2 by RT PCR NEGATIVE NEGATIVE Final    Comment: (NOTE) SARS-CoV-2 target nucleic acids are NOT DETECTED.  The SARS-CoV-2 RNA is generally detectable in upper respiratory specimens during the acute phase of infection. The lowest concentration of SARS-CoV-2 viral copies this assay can detect is 138 copies/mL. Nada Godley negative result does not preclude SARS-Cov-2 infection and should not be used as the sole basis for treatment or other patient management decisions. Loral Campi negative result may occur with  improper specimen collection/handling,  submission of specimen other than nasopharyngeal swab, presence of viral mutation(s) within the areas targeted by this assay, and inadequate number of viral copies(<138 copies/mL). Devaney Segers negative result must be combined with clinical observations, patient history, and epidemiological information. The expected result is Negative.  Fact Sheet for Patients:  EntrepreneurPulse.com.au  Fact Sheet for Healthcare Providers:  IncredibleEmployment.be  This test is no t yet approved or cleared by the [place] FDA and  has been authorized for detection and/or diagnosis of SARS-CoV-2 by FDA under an Emergency Use Authorization (EUA). This EUA will remain  in effect (meaning this test can be used) for the duration of the COVID-19 declaration under Section 564(b)(1) of the Act, 21 U.S.C.section 360bbb-3(b)(1), unless the authorization is terminated  or revoked sooner.       Influenza Chara Marquard by PCR NEGATIVE NEGATIVE Final   Influenza B by PCR NEGATIVE NEGATIVE Final    Comment: (NOTE) The Xpert Xpress SARS-CoV-2/FLU/RSV plus assay is intended as an aid in the diagnosis of influenza from Nasopharyngeal swab specimens and should not be used as Tassie Pollett sole basis for treatment. Nasal washings and aspirates are unacceptable for Xpert Xpress SARS-CoV-2/FLU/RSV testing.  Fact Sheet for Patients: EntrepreneurPulse.com.au  Fact Sheet for Healthcare Providers: IncredibleEmployment.be  This test is not yet approved or cleared by the [place] FDA and has been authorized for detection and/or diagnosis of SARS-CoV-2 by FDA under an Emergency Use Authorization (EUA). This EUA will remain in effect (meaning this test can be used) for the duration of the COVID-19 declaration under Section 564(b)(1) of the Act, 21 U.S.C. section 360bbb-3(b)(1), unless the authorization is terminated or revoked.  Performed at [facility], [address] [place], [place] [address]   MRSA Next Gen by PCR, Nasal     Status: Abnormal   Collection Time: 03/21/21  7:50 AM   Specimen: Nasal Mucosa; Nasal Swab  Result Value Ref Range Status  MRSA by PCR Next Gen DETECTED (Ibrahem Volkman) NOT DETECTED Final    Comment: RESULT CALLED TO, READ BACK BY AND VERIFIED WITH: SANDY RODRIGUEZ 03/21/21 0954 KLW (NOTE) The GeneXpert MRSA Assay (FDA approved for NASAL specimens only), is one component of Adilyn Humes comprehensive MRSA colonization surveillance program. It is not intended to diagnose MRSA infection nor to guide or monitor treatment for MRSA infections. Test performance is not FDA approved in patients less than 41 years old. Performed at [facility], [place], [place] [address]   Aerobic/Anaerobic Culture w Gram Stain (surgical/deep wound)     Status: None (Preliminary result)   Collection Time: 03/22/21  5:07 PM   Specimen: Biopsy  Result Value Ref Range Status   Specimen Description BIOPSY BONE LEFT HEEL  Final   Special Requests NONE  Final   Gram Stain   Final    NO SQUAMOUS EPITHELIAL CELLS SEEN FEW WBC SEEN NO ORGANISMS SEEN Performed at [facility], [place] [address], [place], [place] [address]    Culture   Final    RARE STAPHYLOCOCCUS AUREUS RARE ENTEROCOCCUS FAECALIS SUSCEPTIBILITIES TO FOLLOW NO ANAEROBES ISOLATED; CULTURE IN PROGRESS FOR 5 DAYS    Report Status PENDING  Incomplete         Radiology Studies: CT HEAD WO CONTRAST (5MM)  Result Date: 03/25/2021 CLINICAL DATA:  Delirium. EXAM: CT HEAD WITHOUT CONTRAST TECHNIQUE: Contiguous axial images were obtained from the base of the skull through the vertex without intravenous contrast. COMPARISON:  CT head 11/15/2019 FINDINGS: Brain: No evidence of acute infarction, hemorrhage, hydrocephalus, extra-axial collection or mass lesion/mass effect. 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 Generalized parenchymal volume loss with associated enlargement of the ventricles. Hypodensities in the periventricular and subcortical white matter, consistent with chronic small-vessel ischemic changes. Vascular: No hyperdense vessel or unexpected calcification. Skull: Normal. Negative for fracture or focal lesion. Sinuses/Orbits: No acute finding. Trace mucosal thickening in the maxillary sinuses. Other: None. IMPRESSION: No acute intracranial abnormality. Electronically Signed   By: Ileana Roup M.D.   On: 03/25/2021 12:56        Scheduled Meds:  amLODipine  10 mg Oral Daily   aspirin  81 mg Oral Daily   atorvastatin  40 mg Oral Daily   Chlorhexidine Gluconate Cloth  6 each Topical Daily   donepezil  10 mg Oral QHS   enoxaparin (LOVENOX) injection  0.5 mg/kg Subcutaneous Q24H   insulin aspart  0-6 Units Subcutaneous Q4H   [START ON 03/26/2021] insulin glargine-yfgn  5 Units Subcutaneous Daily   levothyroxine  50 mcg Oral Daily   mupirocin ointment  1 application Nasal BID   polyethylene glycol  17 g Oral Daily   vancomycin variable dose per unstable renal function (pharmacist dosing)   Does not apply See admin instructions   Continuous Infusions:  lactated ringers     piperacillin-tazobactam (ZOSYN)  IV 3.375 g (03/25/21 1358)     LOS: 4 days    Time spent: over 30 min    Fayrene Helper, MD [facility]   To contact the attending provider between 7A-7P or the covering provider during after hours 7P-7A, please log into the web site [email or web address] and access using universal [place] password for that web site. If you do not have the password, please call the hospital operator.  03/25/2021, 6:44 PM

## 2021-03-25 NOTE — Assessment & Plan Note (Addendum)
Synthroid -> IV TSH pending

## 2021-03-25 NOTE — Hospital Course (Addendum)
75 year old with history of type 2 diabetes, hypertension, CKD stage IV, hypothyroidism, Charcot foot and peripheral arterial disease.  Presenting to the emergency room with left heel ulcer for several weeks.  Patient is mostly bedbound.  She uses wheelchair to get around.  Followed by podiatry as outpatient.  Admitted for surgical intervention.  She was diagnosed with osteomyelitis of the left calcaneus, seen by vascular and underwent angiogram and stenting.  Podiatry performed irrigation and debridement of foot and bone biopsy.  Amputation was recommended, but patient reportedly not interested.  She developed encephalopathy and neurology was consulted.  Mild improvement noted from 12/19 to 12/20.  Workup underway.  See below for additional details

## 2021-03-25 NOTE — Assessment & Plan Note (Addendum)
As above.

## 2021-03-25 NOTE — Assessment & Plan Note (Signed)
amlodipine 

## 2021-03-26 DIAGNOSIS — R9401 Abnormal electroencephalogram [EEG]: Secondary | ICD-10-CM

## 2021-03-26 DIAGNOSIS — R569 Unspecified convulsions: Secondary | ICD-10-CM

## 2021-03-26 LAB — COMPREHENSIVE METABOLIC PANEL
ALT: <5 U/L (ref 0–44)
AST: 12 U/L — ABNORMAL LOW (ref 15–41)
Albumin: 2.4 g/dL — ABNORMAL LOW (ref 3.5–5.0)
Alkaline Phosphatase: 66 U/L (ref 38–126)
Anion gap: 10 (ref 5–15)
BUN: 38 mg/dL — ABNORMAL HIGH (ref 8–23)
CO2: 14 mmol/L — ABNORMAL LOW (ref 22–32)
Calcium: 8.9 mg/dL (ref 8.9–10.3)
Chloride: 118 mmol/L — ABNORMAL HIGH (ref 98–111)
Creatinine, Ser: 2.03 mg/dL — ABNORMAL HIGH (ref 0.44–1.00)
GFR, Estimated: 25 mL/min — ABNORMAL LOW (ref >60)
Glucose, Bld: 141 mg/dL — ABNORMAL HIGH (ref 70–99)
Potassium: 4.1 mmol/L (ref 3.5–5.1)
Sodium: 142 mmol/L (ref 135–145)
Total Bilirubin: 1.4 mg/dL — ABNORMAL HIGH (ref 0.3–1.2)
Total Protein: 7.4 g/dL (ref 6.5–8.1)

## 2021-03-26 LAB — CBC WITH DIFFERENTIAL/PLATELET
Abs Immature Granulocytes: 0.04 10*3/uL (ref 0.00–0.07)
Basophils Absolute: 0.1 10*3/uL (ref 0.0–0.1)
Basophils Relative: 1 %
Eosinophils Absolute: 0 10*3/uL (ref 0.0–0.5)
Eosinophils Relative: 0 %
HCT: 26.7 % — ABNORMAL LOW (ref 36.0–46.0)
Hemoglobin: 8.4 g/dL — ABNORMAL LOW (ref 12.0–15.0)
Immature Granulocytes: 0 %
Lymphocytes Relative: 23 %
Lymphs Abs: 2.2 10*3/uL (ref 0.7–4.0)
MCH: 29.5 pg (ref 26.0–34.0)
MCHC: 31.5 g/dL (ref 30.0–36.0)
MCV: 93.7 fL (ref 80.0–100.0)
Monocytes Absolute: 1 10*3/uL (ref 0.1–1.0)
Monocytes Relative: 11 %
Neutro Abs: 6 10*3/uL (ref 1.7–7.7)
Neutrophils Relative %: 65 %
Platelets: 193 10*3/uL (ref 150–400)
RBC: 2.85 MIL/uL — ABNORMAL LOW (ref 3.87–5.11)
RDW: 13.9 % (ref 11.5–15.5)
WBC: 9.2 10*3/uL (ref 4.0–10.5)
nRBC: 0 % (ref 0.0–0.2)

## 2021-03-26 LAB — GLUCOSE, CAPILLARY
Glucose-Capillary: 132 mg/dL — ABNORMAL HIGH (ref 70–99)
Glucose-Capillary: 119 mg/dL — ABNORMAL HIGH (ref 70–99)
Glucose-Capillary: 149 mg/dL — ABNORMAL HIGH (ref 70–99)
Glucose-Capillary: 121 mg/dL — ABNORMAL HIGH (ref 70–99)
Glucose-Capillary: 121 mg/dL — ABNORMAL HIGH (ref 70–99)
Glucose-Capillary: 110 mg/dL — ABNORMAL HIGH (ref 70–99)

## 2021-03-26 LAB — BASIC METABOLIC PANEL
Anion gap: 10 (ref 5–15)
BUN: 36 mg/dL — ABNORMAL HIGH (ref 8–23)
CO2: 17 mmol/L — ABNORMAL LOW (ref 22–32)
Calcium: 9 mg/dL (ref 8.9–10.3)
Chloride: 113 mmol/L — ABNORMAL HIGH (ref 98–111)
Creatinine, Ser: 2.12 mg/dL — ABNORMAL HIGH (ref 0.44–1.00)
GFR, Estimated: 24 mL/min — ABNORMAL LOW (ref >60)
Glucose, Bld: 135 mg/dL — ABNORMAL HIGH (ref 70–99)
Potassium: 3.6 mmol/L (ref 3.5–5.1)
Sodium: 140 mmol/L (ref 135–145)

## 2021-03-26 LAB — PHOSPHORUS: Phosphorus: 4.7 mg/dL — ABNORMAL HIGH (ref 2.5–4.6)

## 2021-03-26 LAB — VANCOMYCIN, RANDOM: Vancomycin Rm: 25

## 2021-03-26 LAB — MAGNESIUM: Magnesium: 2 mg/dL (ref 1.7–2.4)

## 2021-03-26 MED ORDER — CYANOCOBALAMIN 1000 MCG/ML IJ SOLN
1000.0000 ug | Freq: Once | INTRAMUSCULAR | Status: AC
  Administered 2021-03-26: 22:00:00 1000 ug via INTRAMUSCULAR
  Filled 2021-03-26: qty 1

## 2021-03-26 MED ORDER — STERILE WATER FOR INJECTION IV SOLN
INTRAVENOUS | Status: AC
  Filled 2021-03-26: qty 150
  Filled 2021-03-26 (×2): qty 1000
  Filled 2021-03-26: qty 150

## 2021-03-26 MED ORDER — LORAZEPAM 2 MG/ML IJ SOLN
1.0000 mg | Freq: Once | INTRAMUSCULAR | Status: AC
  Administered 2021-03-26: 17:00:00 1 mg via INTRAVENOUS
  Filled 2021-03-26: qty 1

## 2021-03-26 MED ORDER — LEVETIRACETAM IN NACL 1500 MG/100ML IV SOLN
1500.0000 mg | Freq: Once | INTRAVENOUS | Status: AC
  Administered 2021-03-26: 19:00:00 1500 mg via INTRAVENOUS
  Filled 2021-03-26: qty 100

## 2021-03-26 MED ORDER — LEVETIRACETAM IN NACL 500 MG/100ML IV SOLN
500.0000 mg | Freq: Two times a day (BID) | INTRAVENOUS | Status: DC
  Administered 2021-03-27 – 2021-03-31 (×9): 500 mg via INTRAVENOUS
  Filled 2021-03-26 (×11): qty 100

## 2021-03-26 MED ORDER — ENOXAPARIN SODIUM 40 MG/0.4ML IJ SOSY
40.0000 mg | PREFILLED_SYRINGE | INTRAMUSCULAR | Status: DC
  Administered 2021-03-26 – 2021-03-27 (×2): 40 mg via SUBCUTANEOUS
  Filled 2021-03-26 (×2): qty 0.4

## 2021-03-26 MED ORDER — LEVOTHYROXINE SODIUM 100 MCG/5ML IV SOLN
25.0000 ug | Freq: Every day | INTRAVENOUS | Status: DC
  Administered 2021-03-27: 05:00:00 25 ug via INTRAVENOUS
  Filled 2021-03-26 (×2): qty 5

## 2021-03-26 NOTE — Progress Notes (Signed)
Pharmacy Antibiotic Note  [name] [name] is a 75 y.o. female admitted on 03/20/2021 with  DM foot infection/Osteomyelitis .  Pharmacy has been consulted for daptomycin and Zosyn dosing.  Patient is s/p irrigation and debridement and bone biopsy of left foot wound on 12/15. Has OM in the calcaneus and medullary canal. Vascular consulted for BKA but patient unwilling to proceed at this point.   Scr with slight bump today. Hyperchloremic NAGMA noted on BMP. Likely secondary to normal saline versus worsening renal function from vancomycin, ID consulted and based on renal function, will change vancomycin to daptomycin when vancomycin levels appropriate.    Steady State PK 12/17&12/18: Vpk: 40 (drawn ~1 hr after completion of infusion) Vtr: 31 (drawn ~ 3 hr early) AUC: 834, Cmax 40.7, Cmin 29.4, t1/2 49h  Plan: - Daptomycin initiation pending vancomycin levels <15 mcg/mL.   Vancomycin level ordered for morning.    Check CK with morning labs (patient on atorvastatin 40mg ) for hyperlipidemia  -Zosyn 3.375 g IV q8h (4-hr infusion)  Crcl 28.1 ml/min   Height: 5\' 7"  (170.2 cm) Weight: 90.7 kg (200 lb) IBW/kg (Calculated) : 61.6  Temp (24hrs), Avg:98 F (36.7 C), Min:97.7 F (36.5 C), Max:98.2 F (36.8 C)  Recent Labs  Lab 03/20/21 1446 03/21/21 0142 03/22/21 0424 03/24/21 1647 03/25/21 0416 03/25/21 1050 03/26/21 0411  WBC 9.1 8.2  --   --  9.0  --  9.2  CREATININE 1.67* 1.51* 1.49*  --  1.81*   1.78*  --  2.03*  LATICACIDVEN 1.2  --   --   --   --   --   --   VANCOTROUGH  --   --   --   --   --  31*  --   VANCOPEAK  --   --   --  40  --   --   --   VANCORANDOM  --   --   --   --   --   --  25     Estimated Creatinine Clearance: 28.1 mL/min (A) (by C-G formula based on SCr of 2.03 mg/dL (H)).    No Known Allergies  Antimicrobials this admission: Vancomycin 12/13 >> 12/ Cefepime 12/13 >> 12/18     (possible encephlopathy) Zosyn 12/18 >> Daptomycin   Dose adjustments this  admission: See above  Microbiology results: 12/14 MRSA PCR: + 12/15 Bone cx: MRSA, E faecalis (amp-susc)  Thank you for allowing pharmacy to be a part of this patients care.  Doreene Eland, PharmD, BCPS, BCIDP Work Cell: [phone] 03/26/2021 4:33 PM

## 2021-03-26 NOTE — Progress Notes (Signed)
S: Patient alert but nonverbal on my examination. Attends examiner and appears as if she is attempting to speak but cannot. Low frequency continuous movement of her upper trunk and shoulders, appears mildly choreoathetotic. Cefepime d/c'd.   Data: EEG this afternoon interpreted by me shows continuous triphasics, initially at 1 Hz, highly rhythmic, with episodic increases in frequency to 2-2.5 Hz with possible subtle evolution followed by brief periods of relative suppression c/f possible nonconvulsive seizure activity.  MRI brain wo contrast 03/25/21 NAICP (personal review)  O:  Vitals:   03/26/21 1147 03/26/21 1640  BP: (!) 143/65 (!) 156/67  Pulse: 75 64  Resp: 15 17  Temp: 97.7 F (36.5 C) 97.9 F (36.6 C)  SpO2: 97% 98%   Gen: alert, NAD CV: RRR Resp: CTAB  Neurologic exam MS: awake, alert but no responsive to orientation questions, does not follow simple commands Speech: nonverbal CN: PERRL, EOMI, corneals intact, face symmetric at rest, hearing intact to voice Motor: Low frequency continuous movement of her upper trunk and shoulders, appears mildly choreoathetotic. No movement to command, no spontaneous movement antigravity of any extremity. Withdraws to noxious stimuli in all extremities equally. Sensory: SILT DTRs: 3+ and symmetric BUE, areflexic bilat patellae, plantars deferred 2/2 wound Cerebellar, gait: UTA  Assessment:  75 y.o. female with a PMHx of chronic diastolic HF, CKD4, Charcot foot, anemia, DM, HLD, HTN, hypothyroidism and prior toe amputation who presented to the hospital on 12/13 with a several-week history of worsening diabetic left heel ulcer that progressed to the point that it was draining purulent material. She was admitted for debridement and possible endovascular intervention. She was started on IV antibiotics and Podiatry was consulted in addition to Vascular Surgery. She underwent left foot wound debridement and was found to have osteiomyelitis in the  calcaneus and medullary canal. BKA was then recommended, but patient has so far not been receptive to this option. Neurology consulted on 03/25/21 2/2 abnormal movements and patient not interacting with providers. Exam yesterday revealed myoclonic-like changes in tone to BUE triggered consistently by passive movement. Today she has subtle low amplitude low frequency writhing movements of her upper trunk and BUE which appear somewhat choreoathetotic. No myoclonus on my exam. MRI brain wo contrast NAICP. Cefepime d/c'd 12/18 but mental status has not improved. No neck stiffness or fever to suggest a meningitis.  EEG this afternoon interpreted by me shows continuous triphasics, initially at 1 Hz, highly rhythmic, with episodic increases in frequency to 2-2.5 Hz with possible subtle evolution followed by brief periods of relative suppression c/f possible nonconvulsive seizure activity.     Recommendations: - 1mg  IV ativan now - Keppra load 1500mg  IV f/b 500mg  q 12 hrs (renal dosing) - Seizure precautions - STAT head CT for decline in neurologic exam - Repeat EEG tomorrow. If continues to be concerning for nonconvulsive seizure activity will consider transfer at that time. Her EEG right now is borderline on the ictal-interictal continuum and I do not feel she needs to be transferred urgently. VSS.  Will continue to follow.  Su Monks, MD [facility] [phone]  If 7pm- 7am, please page neurology on call as listed in [place].

## 2021-03-26 NOTE — Progress Notes (Signed)
Pharmacy Antibiotic Note  [name] [name] is a 75 y.o. female admitted on 03/20/2021 with  DM foot infection/Osteomyelitis .  Pharmacy has been consulted for vancomycin and Zosyn dosing.  Patient is s/p irrigation and debridement and bone biopsy of left foot wound on 12/15. Has OM in the calcaneus and medullary canal. Vascular consulted for BKA but patient unwilling to proceed at this point. Plan is for ID consult for antimicrobial recommendations  Scr with slight bump today. Hyperchloremic NAGMA noted on BMP. Likely secondary to normal saline versus worsening renal function from vancomycin  Steady State PK 12/17&12/18: Vpk: 40 (drawn ~1 hr after completion of infusion) Vtr: 31 (drawn ~ 3 hr early) AUC: 834, Cmax 40.7, Cmin 29.4, t1/2 49h  Plan:  -Zosyn 3.375 g IV q8h (4-hr infusion)  Crcl 28.1 ml/min  -Vancomycin --Dose per levels for now.  Scr 1.81>>2.03 --Vanc random level 12/19 @0411 = 25 mcg/ml. No dose today 12/19. --recheck a level 12/20 AM: plan to redose when level < 20 mcg/ml --(Anticipate possibly starting new maintenance regimen of 1000 mg q48h once vancomycin level < 20 mcg/mL if renal function is stable)   Height: 5\' 7"  (170.2 cm) Weight: 90.7 kg (200 lb) IBW/kg (Calculated) : 61.6  Temp (24hrs), Avg:98.1 F (36.7 C), Min:97.7 F (36.5 C), Max:98.8 F (37.1 C)  Recent Labs  Lab 03/20/21 1446 03/21/21 0142 03/22/21 0424 03/24/21 1647 03/25/21 0416 03/25/21 1050 03/26/21 0411  WBC 9.1 8.2  --   --  9.0  --  9.2  CREATININE 1.67* 1.51* 1.49*  --  1.81*   1.78*  --  2.03*  LATICACIDVEN 1.2  --   --   --   --   --   --   VANCOTROUGH  --   --   --   --   --  31*  --   VANCOPEAK  --   --   --  40  --   --   --   VANCORANDOM  --   --   --   --   --   --  25     Estimated Creatinine Clearance: 28.1 mL/min (A) (by C-G formula based on SCr of 2.03 mg/dL (H)).    No Known Allergies  Antimicrobials this admission: Vancomycin 12/13 >> Cefepime 12/13 >> 12/18      (possible encephlopathy) Zosyn 12/18 >>  Dose adjustments this admission: See above  Microbiology results: 12/14 MRSA PCR: + 12/15 Bone cx: SA, E faecalis, pending  Thank you for allowing pharmacy to be a part of this patients care.  [name] [name] A 03/26/2021 11:57 AM

## 2021-03-26 NOTE — Progress Notes (Signed)
PROGRESS NOTE    TRITIA ENDO  YQM:[id number] DOB: April 20, 1945 DOA: 03/20/2021 PCP: Marguerita Merles, MD  Chief Complaint  Patient presents with   Wound Infection    Brief Narrative:  75 year old with history of type 2 diabetes, hypertension, CKD stage IV, hypothyroidism, Charcot foot and peripheral arterial disease.  Presenting to the emergency room with left heel ulcer for several weeks.  Patient is mostly bedbound.  She uses wheelchair to get around.  Followed by podiatry as outpatient.  Admitted for surgical intervention.    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)   Anemia   Atrial tachycardia ([place])   Acute metabolic encephalopathy Minimally interactive since 12/17 per discussion/notes- it was thought she was depressed with amputation news Continued inability to communicate, she's got myoclonic jerks, unable to follow commands, tries to look at you and appears like she's trying to speak Concern for encephalopathy related to cefepime    Head CT without acute findings EEG pending MRI without acute findings Ammonia wnl, b12 low normal, follow MMA (will give x1 IM), folate wnl Discussed with neurology who will see her today  Osteomyelitis [facility]) As above  Diabetic foot ulcer ([place]) MRI 12/13 with large soft tissue ulceration overlying calcaneus with findings c/w osteomyelitis S/p angiogram 12/14 with percutaneous transluminal angioplasty of L peroneal artery and TP trunk  S/p I&D of foot on 12/15 with bone bx None bx from 12/15 with MRSA (vancomycin) and e faecalis (sensitive to vanc/ampicillin)  Continue vanc/zosyn.   Cefepime d/c'd due to encephalopathy.  Vanc dosing per pharmacy. C/s ID.  Appreciate vascular, podiatry, and ID input.  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) Reduce basal dose, q4 SSI while encephalopathic  Hyperlipidemia lipitor  CKD stage 4 due to type 2 diabetes mellitus ([place]) Seems close to baseline, fluctuating here - up today, but still around baseline Metabolic acidosis related to IVF/hyperchloremia - transition to sodium bicarb   Hypothyroidism (acquired) Synthroid  Essential hypertension amlodipine  Atrial tachycardia ([place]) Seen by cards (12/17 - noted irregular HR most c/w preatrial contractions, no evidence of advanced heart block)  Anemia Anemia labs Normal folate, low normal B12 Iron studies pending     DVT prophylaxis: lovenox Code Status: full Family Communication: son Disposition:   Status is: Inpatient  Remains inpatient appropriate because: need for further w/u/interventions       Consultants:  Vascular Podiatry ID Neurology  Procedures:  IRRIGATION AND DEBRIDEMENT FOOT AND BONE BIOPSY (Left)  .  1. Ultrasound guidance for vascular access right femoral artery             2.  Catheter placement into left SFA from right femoral approach             3.  Aortogram and selective left lower extremity angiogram             4.  Percutaneous transluminal angioplasty of left peroneal artery and TP trunk with 2.5 mm balloon proximally and 2 mm balloon distally             5. StarClose  closure device right femoral artery Antimicrobials:  Anti-infectives (From admission, onward)    Start     Dose/Rate Route Frequency Ordered Stop   03/25/21 1400  piperacillin-tazobactam (ZOSYN) IVPB 3.375 g        3.375 g 12.5 mL/hr over 240 Minutes Intravenous Every 8 hours 03/25/21 1158     03/25/21 1130  vancomycin variable dose per unstable renal function (pharmacist dosing)         Does not  apply See admin instructions 03/25/21 1130     03/25/21 0830  metroNIDAZOLE (FLAGYL) tablet 500 mg  Status:  Discontinued        500 mg Oral Every 12 hours 03/25/21 0800 03/25/21 1157   03/22/21 0000  ceFAZolin (ANCEF) IVPB 1 g/50 mL premix  Status:  Discontinued       Note to Pharmacy: To be given in specials   1 g 100 mL/hr over 30 Minutes Intravenous 60 min pre-op 03/21/21 1545 03/21/21 1724   03/21/21 1607  ceFAZolin (ANCEF) IVPB 1 g/50 mL premix  Status:  Discontinued        over 30 Minutes  Continuous PRN 03/21/21 1640 03/21/21 1724   03/21/21 1200  vancomycin (VANCOREADY) IVPB 750 mg/150 mL  Status:  Discontinued        750 mg 150 mL/hr over 60 Minutes Intravenous Every 24 hours 03/21/21 1008 03/21/21 1022   03/21/21 1200  vancomycin (VANCOCIN) IVPB 750 mg/150 ml premix  Status:  Discontinued        750 mg 150 mL/hr over 60 Minutes Intravenous Every 24 hours 03/21/21 1026 03/25/21 1130   03/21/21 1100  ceFEPIme (MAXIPIME) 2 g in sodium chloride 0.9 % 100 mL IVPB  Status:  Discontinued        2 g 200 mL/hr over 30 Minutes Intravenous Every 12 hours 03/21/21 1002 03/25/21 1116   03/20/21 1645  ceFEPIme (MAXIPIME) 2 g in sodium chloride 0.9 % 100 mL IVPB        2 g 200 mL/hr over 30 Minutes Intravenous  Once 03/20/21 1639 03/20/21 1756   03/20/21 1645  vancomycin (VANCOCIN) IVPB 1000 mg/200 mL premix        1,000 mg 200 mL/hr over 60 Minutes Intravenous  Once 03/20/21 1639 03/20/21 1912       Subjective: unresponsive  Objective: Vitals:   03/25/21 2006 03/26/21 0519 03/26/21 0754 03/26/21 1147  BP: (!) 146/84 (!) 122/59 (!) 131/58 (!) 143/65  Pulse: 64 70 63 75  Resp: 18 18 17 15   Temp: 97.7 F (36.5 C) 98.2 F (36.8 C) 98.2 F (36.8 C) 97.7 F (36.5 C)  TempSrc: Oral Oral    SpO2: 99% 97% 100% 97%  Weight:      Height:        Intake/Output Summary (Last 24 hours) at 03/26/2021 1543 Last data filed at 03/26/2021 0500 Gross per 24 hour  Intake 1050 ml  Output  1200 ml  Net -150 ml   Filed Weights   03/20/21 1446  Weight: 90.7 kg    Examination:  General: No acute distress. Cardiovascular: RRR Lungs: unlabored Abdomen: Soft, nontender, nondistended  Neurological: turns towards me when I enter room, looks like she's trying to speak, but unable, repetitive attempts to turn and look towards me - unable to follow commands - myoclonic jerks Extremities: prafo boots, dressing    Data Reviewed: I have personally reviewed following labs and imaging studies  CBC: Recent Labs  Lab 03/20/21 1446 03/21/21 0142 03/25/21 0416  03/26/21 0411  WBC 9.1 8.2 9.0 9.2  NEUTROABS 5.0  --   --  6.0  HGB 10.8* 9.7* 9.5* 8.4*  HCT 34.0* 29.9* 29.8* 26.7*  MCV 93.2 90.9 92.5 93.7  PLT 231 216 212 390    Basic Metabolic Panel: Recent Labs  Lab 03/20/21 1446 03/21/21 0142 03/22/21 0424 03/25/21 0416 03/26/21 0411  NA 136 136 136 140 142  K 4.2 4.1 4.1 4.5 4.1  CL 109 109 107 114* 118*  CO2 21* 22 20* 17* 14*  GLUCOSE 141* 148* 139* 80 141*  BUN 33* 34* 30* 32* 38*  CREATININE 1.67* 1.51* 1.49* 1.81*   1.78* 2.03*  CALCIUM 9.1 8.7* 8.7* 8.9 8.9  MG  --   --   --   --  2.0  PHOS  --   --   --   --  4.7*    GFR: Estimated Creatinine Clearance: 28.1 mL/min (Zyler Hyson) (by C-G formula based on SCr of 2.03 mg/dL (H)).  Liver Function Tests: Recent Labs  Lab 03/20/21 1446 03/25/21 0416 03/26/21 0411  AST 20 12* 12*  ALT 9 7 <5  ALKPHOS 87 66 66  BILITOT 0.7 1.0 1.4*  PROT 8.5* 7.8 7.4  ALBUMIN 3.3* 2.6* 2.4*    CBG: Recent Labs  Lab 03/25/21 2122 03/25/21 2308 03/26/21 0409 03/26/21 0834 03/26/21 1144  GLUCAP 111* 112* 132* 119* 149*     Recent Results (from the past 240 hour(s))  Resp Panel by RT-PCR (Flu Darian Cansler&B, Covid) Nasopharyngeal Swab     Status: None   Collection Time: 03/20/21  5:07 PM   Specimen: Nasopharyngeal Swab; Nasopharyngeal(NP) swabs in vial transport medium  Result Value Ref Range Status   SARS Coronavirus 2 by  RT PCR NEGATIVE NEGATIVE Final    Comment: (NOTE) SARS-CoV-2 target nucleic acids are NOT DETECTED.  The SARS-CoV-2 RNA is generally detectable in upper respiratory specimens during the acute phase of infection. The lowest concentration of SARS-CoV-2 viral copies this assay can detect is 138 copies/mL. Greta Yung negative result does not preclude SARS-Cov-2 infection and should not be used as the sole basis for treatment or other patient management decisions. Guillermina Shaft negative result may occur with  improper specimen collection/handling, submission of specimen other than nasopharyngeal swab, presence of viral mutation(s) within the areas targeted by this assay, and inadequate number of viral copies(<138 copies/mL). Danta Baumgardner negative result must be combined with clinical observations, patient history, and epidemiological information. The expected result is Negative.  Fact Sheet for Patients:  EntrepreneurPulse.com.au  Fact Sheet for Healthcare Providers:  IncredibleEmployment.be  This test is no t yet approved or cleared by the [place] FDA and  has been authorized for detection and/or diagnosis of SARS-CoV-2 by FDA under an Emergency Use Authorization (EUA). This EUA will remain  in effect (meaning this test can be used) for the duration of the COVID-19 declaration under Section 564(b)(1) of the Act, 21 U.S.C.section 360bbb-3(b)(1), unless the authorization is terminated  or revoked sooner.       Influenza [place] by PCR NEGATIVE NEGATIVE Final   Influenza B by PCR NEGATIVE NEGATIVE Final    Comment: (NOTE) The Xpert Xpress SARS-CoV-2/FLU/RSV plus assay is intended as an aid in the diagnosis of influenza from Nasopharyngeal swab specimens and should not be used as Auden Tatar sole basis for treatment. Nasal washings and aspirates are unacceptable for Xpert Xpress SARS-CoV-2/FLU/RSV testing.  Fact Sheet for Patients: EntrepreneurPulse.com.au  Fact Sheet  for Healthcare Providers: IncredibleEmployment.be  This test is not  yet approved or cleared by the [place] and has been authorized for detection and/or diagnosis of SARS-CoV-2 by FDA under an Emergency Use Authorization (EUA). This EUA will remain in effect (meaning this test can be used) for the duration of the COVID-19 declaration under Section 564(b)(1) of the Act, 21 U.S.C. section 360bbb-3(b)(1), unless the authorization is terminated or revoked.  Performed at [facility], [address] [place], [place] [address]   MRSA Next Gen by PCR, Nasal     Status: Abnormal   Collection Time: 03/21/21  7:50 AM   Specimen: Nasal Mucosa; Nasal Swab  Result Value Ref Range Status   MRSA by PCR Next Gen DETECTED (Flor [place]es[place]) NOT DETECTED Final    Comment: RESULT CALLED TO, READ BACK BY AND VERIFIED WITH: SANDY RODRIGUEZ 03/21/21 0954 KLW (NOTE) The GeneXpert MRSA Assay (FDA approved for NASAL specimens only), is one component of Malvina Schadler comprehensive MRSA colonization surveillance program. It is not intended to diagnose MRSA infection nor to guide or monitor treatment for MRSA infections. Test performance is not FDA approved in patients less than 21 years old. Performed at [facility], [place], [place] [address]   Aerobic/Anaerobic Culture w Gram Stain (surgical/deep wound)     Status: None (Preliminary result)   Collection Time: 03/22/21  5:07 PM   Specimen: Biopsy  Result Value Ref Range Status   Specimen Description BIOPSY BONE LEFT HEEL  Final   Special Requests NONE  Final   Gram Stain   Final    NO SQUAMOUS EPITHELIAL CELLS SEEN FEW WBC SEEN NO ORGANISMS SEEN Performed at [facility], [place] [address], [place], [place] [address]    Culture   Final    RARE METHICILLIN RESISTANT STAPHYLOCOCCUS AUREUS RARE ENTEROCOCCUS FAECALIS NO ANAEROBES ISOLATED; CULTURE IN PROGRESS FOR 5 DAYS    Report Status PENDING  Incomplete    Organism ID, Bacteria METHICILLIN RESISTANT STAPHYLOCOCCUS AUREUS  Final   Organism ID, Bacteria ENTEROCOCCUS FAECALIS  Final      Susceptibility   Enterococcus faecalis - MIC*    AMPICILLIN <=2 SENSITIVE Sensitive     VANCOMYCIN 1 SENSITIVE Sensitive     GENTAMICIN SYNERGY RESISTANT Resistant     * RARE ENTEROCOCCUS FAECALIS   Methicillin resistant staphylococcus aureus - MIC*    CIPROFLOXACIN >=8 RESISTANT Resistant     ERYTHROMYCIN >=8 RESISTANT Resistant     GENTAMICIN <=0.5 SENSITIVE Sensitive     OXACILLIN >=4 RESISTANT Resistant     TETRACYCLINE <=1 SENSITIVE Sensitive     VANCOMYCIN <=0.5 SENSITIVE Sensitive     TRIMETH/SULFA >=320 RESISTANT Resistant     CLINDAMYCIN <=0.25 SENSITIVE Sensitive     RIFAMPIN <=0.5 SENSITIVE Sensitive     Inducible Clindamycin NEGATIVE Sensitive     * RARE METHICILLIN RESISTANT STAPHYLOCOCCUS AUREUS         Radiology Studies: CT HEAD WO CONTRAST (5MM)  Result Date: 03/25/2021 CLINICAL DATA:  Delirium. EXAM: CT HEAD WITHOUT CONTRAST TECHNIQUE: Contiguous axial images were obtained from the base of the skull through the vertex without intravenous contrast. COMPARISON:  CT head 11/15/2019 FINDINGS: Brain: No evidence of acute infarction, hemorrhage, hydrocephalus, extra-axial collection or mass lesion/mass effect. 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 Generalized parenchymal volume loss with associated enlargement of the ventricles. Hypodensities in the periventricular and subcortical white matter, consistent with chronic small-vessel  ischemic changes. Vascular: No hyperdense vessel or unexpected calcification. Skull: Normal. Negative for fracture or focal lesion. Sinuses/Orbits: No acute finding. Trace mucosal thickening in the maxillary sinuses. Other: None. IMPRESSION: No acute intracranial abnormality. Electronically  Signed   By: Ileana Roup M.D.   On: 03/25/2021 12:56   MR BRAIN WO CONTRAST  Result Date: 03/25/2021 CLINICAL DATA:  Delirium EXAM: MRI HEAD WITHOUT CONTRAST TECHNIQUE: Multiplanar, multiecho pulse sequences of the brain and surrounding structures were obtained without intravenous contrast. COMPARISON:  09/08/2018, correlation is also made with CT head 03/25/2021 FINDINGS: Brain: No restricted diffusion to suggest acute or subacute infarct. No hemorrhage, hydrocephalus, extra-axial collection, or mass lesion. Generalized parenchymal volume loss, with ex vacuo dilatation of the ventricles which appears largely unchanged compared to 2020. 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 T2 hyperintense signal in the periventricular white matter, likely the sequela of moderate chronic small vessel ischemic disease. Vascular: Normal flow voids. Skull and upper cervical spine: Normal marrow signal. Sinuses/Orbits: Mucosal thickening in the maxillary sinuses and ethmoid air cells. Status post bilateral lens replacements. Other: Trace fluid in right mastoid air cells. IMPRESSION: No acute intracranial process. Electronically Signed   By: Merilyn Baba M.D.   On: 03/25/2021 23:52        Scheduled Meds:  amLODipine  10 mg Oral Daily   aspirin  81 mg Oral Daily   atorvastatin  40 mg Oral Daily   Chlorhexidine Gluconate Cloth  6 each Topical Daily   cyanocobalamin  1,000 mcg Intramuscular Once   donepezil  10 mg Oral QHS   enoxaparin (LOVENOX) injection  40 mg Subcutaneous Q24H   insulin aspart  0-6 Units Subcutaneous Q4H   insulin glargine-yfgn  5 Units Subcutaneous Daily   [START ON 03/27/2021] levothyroxine  25 mcg Intravenous Daily   polyethylene glycol  17 g Oral Daily   vancomycin variable dose per unstable renal function (pharmacist dosing)   Does not apply See admin instructions   Continuous Infusions:  piperacillin-tazobactam (ZOSYN)  IV 3.375 g (03/26/21 1425)    sodium  bicarbonate (isotonic) infusion in sterile water 75 mL/hr at 03/26/21 1020     LOS: 5 days    Time spent: over 30 min    Fayrene Helper, MD [facility]   To contact the attending provider between 7A-7P or the covering provider during after hours 7P-7A, please log into the web site [email or web address] and access using universal [place] password for that web site. If you do not have the password, please call the hospital operator.  03/26/2021, 3:43 PM

## 2021-03-26 NOTE — Procedures (Addendum)
Routine EEG Report  [name] [name] is a 75 y.o. female with a history of encephalopathy who is undergoing an EEG to evaluate for seizures.  Report: This EEG was acquired with electrodes placed according to the International 10-20 electrode system (including Fp1, Fp2, F3, F4, C3, C4, P3, P4, O1, O2, T3, T4, T5, T6, A1, A2, Fz, Cz, Pz). The following electrodes were missing or displaced: none.  The best background was 4-5 Hz. This activity is reactive to stimulation. No sleep architecture was identified. There was no focal slowing. There are continuous triphasic waves throughout the recording, initially at 1 Hz and later in the recording 2-2.5 Hz. The discharges are highly rhythmic and some runs appear to have subtle evolution and be followed by a 1-2 second period of diffuse relative suppression. Photic stimulation and hyperventilation were not performed.   Impression and clinical correlation: This EEG was obtained while obtunded and is abnormal due to: - Moderate diffuse slowing indicative of global cerebral dysfunction - Continuous triphasic waves, at times up to 2-2.5 Hz, at times in runs displaying subtle evolution and followed by a 1-2 second period of relative suppression. This pattern falls on the ictal-interictal continuum and is concerning for nonconvulsive seizure activity.  Su Monks, MD [facility] [phone]  If 7pm- 7am, please page neurology on call as listed in [place].

## 2021-03-26 NOTE — Consult Note (Incomplete)
NAME: [name] [name]  DOB: Oct 06, 1945  MRN: [id number]  Date/Time: 03/26/2021 1:50 PM  REQUESTING PROVIDER Subjective:  REASON FOR CONSULT:  ? [name] [name] is a 75 y.o. with a history of   ID   Steroid/immune suppressants/splenectomy/Hardware Recent Procedure Surgery Injections Trauma Sick contacts Travel Antibiotic use Food- raw/exotic Animal bites Tick exposure Water sports Fishing/hunting/animal bird exposure Past Medical History:  Diagnosis Date   Chronic diastolic CHF (congestive heart failure), NYHA class 3 ([place]) 06/30/2019   Diabetes mellitus without complication ([place])    Humerus fracture 04/20/2018   left   Hyperlipidemia    Hypertension    Hypothyroidism    Vertigo    several yrs ago   Wears dentures    partial upper and lower (loose)    Past Surgical History:  Procedure Laterality Date   AMPUTATION TOE Right 05/31/2020   Procedure: AMPUTATION TOE;  Surgeon: Caroline More, DPM;  Location: [facility];  Service: Podiatry;  Laterality: Right;   CATARACT EXTRACTION W/PHACO Left 12/01/2018   Procedure: CATARACT EXTRACTION PHACO AND INTRAOCULAR LENS PLACEMENT ([place]) LEFT DIABETIC;  Surgeon: Birder Robson, MD;  Location: [place];  Service: Ophthalmology;  Laterality: Left;  Diabetic - insulin   COLONOSCOPY WITH PROPOFOL N/A 07/07/2019   Procedure: COLONOSCOPY WITH PROPOFOL;  Surgeon: Robert Bellow, MD;  Location: [facility];  Service: Endoscopy;  Laterality: N/A;   ESOPHAGOGASTRODUODENOSCOPY (EGD) WITH PROPOFOL N/A 07/07/2019   Procedure: ESOPHAGOGASTRODUODENOSCOPY (EGD) WITH PROPOFOL;  Surgeon: Robert Bellow, MD;  Location: [facility];  Service: Endoscopy;  Laterality: N/A;   FOOT SURGERY Right    lesion excision   IRRIGATION AND DEBRIDEMENT FOOT Left 03/22/2021   Procedure: IRRIGATION AND DEBRIDEMENT FOOT AND BONE BIOPSY;  Surgeon: Felipa Furnace, DPM;  Location: [facility];  Service: Podiatry;  Laterality: Left;   LOWER EXTREMITY  ANGIOGRAPHY Right 05/19/2020   Procedure: Lower Extremity Angiography;  Surgeon: Katha Cabal, MD;  Location: [place] [facility];  Service: Cardiovascular;  Laterality: Right;   LOWER EXTREMITY ANGIOGRAPHY Right 05/30/2020   Procedure: Lower Extremity Angiography;  Surgeon: Katha Cabal, MD;  Location: [place] [facility];  Service: Cardiovascular;  Laterality: Right;  Pedal Approach   LOWER EXTREMITY ANGIOGRAPHY Left 03/21/2021   Procedure: Lower Extremity Angiography;  Surgeon: Algernon Huxley, MD;  Location: [place] [facility];  Service: Cardiovascular;  Laterality: Left;    Social History   Socioeconomic History   Marital status: Divorced    Spouse name: Not on file   Number of children: Not on file   Years of education: Not on file   Highest education level: Not on file  Occupational History   Not on file  Tobacco Use   Smoking status: Never   Smokeless tobacco: Never  Vaping Use   Vaping Use: Never used  Substance and Sexual Activity   Alcohol use: No   Drug use: Never   Sexual activity: Not on file  Other Topics Concern   Not on file  Social History Narrative   Not on file   Social Determinants of Health   Financial Resource Strain: Not on file  Food Insecurity: Not on file  Transportation Needs: Not on file  Physical Activity: Not on file  Stress: Not on file  Social Connections: Not on file  Intimate Partner Violence: Not on file    Family History  Problem Relation Age of Onset   Breast cancer Neg Hx    No Known Allergies I? Current  Facility-Administered Medications  Medication Dose Route Frequency Provider Last Rate Last Admin   acetaminophen (TYLENOL) tablet 650 mg  650 mg Oral Q6H PRN Algernon Huxley, MD   650 mg at 03/23/21 1342   Or   acetaminophen (TYLENOL) suppository 650 mg  650 mg Rectal Q6H PRN Algernon Huxley, MD       amLODipine (NORVASC) tablet 10 mg  10 mg Oral Daily Algernon Huxley, MD   10 mg at 03/23/21 1031   aspirin chewable tablet  81 mg  81 mg Oral Daily Algernon Huxley, MD   81 mg at 03/23/21 1030   atorvastatin (LIPITOR) tablet 40 mg  40 mg Oral Daily Algernon Huxley, MD   40 mg at 03/23/21 1031   bisacodyl (DULCOLAX) suppository 10 mg  10 mg Rectal PRN Algernon Huxley, MD       Chlorhexidine Gluconate Cloth 2 % PADS 6 each  6 each Topical Daily Elodia Florence., MD   6 each at 03/26/21 0902   donepezil (ARICEPT) tablet 10 mg  10 mg Oral QHS Algernon Huxley, MD   10 mg at 03/24/21 2135   enoxaparin (LOVENOX) injection 40 mg  40 mg Subcutaneous Q24H Chinita Greenland A, RPH       insulin aspart (novoLOG) injection 0-6 Units  0-6 Units Subcutaneous Q4H Elodia Florence., MD       insulin glargine-yfgn [facility]) injection 5 Units  5 Units Subcutaneous Daily Elodia Florence., MD   5 Units at 03/26/21 3220   levothyroxine (SYNTHROID) tablet 50 mcg  50 mcg Oral Daily Algernon Huxley, MD   50 mcg at 03/24/21 0529   ondansetron (ZOFRAN) injection 4 mg  4 mg Intravenous Q6H PRN Algernon Huxley, MD   4 mg at 03/26/21 1017   piperacillin-tazobactam (ZOSYN) IVPB 3.375 g  3.375 g Intravenous Q8H Lockie Mola B, RPH 12.5 mL/hr at 03/26/21 0549 3.375 g at 03/26/21 0549   polyethylene glycol (MIRALAX / GLYCOLAX) packet 17 g  17 g Oral Daily Algernon Huxley, MD   17 g at 03/23/21 1030   sodium bicarbonate 150 mEq in sterile water 1,150 mL infusion   Intravenous Continuous Elodia Florence., MD 75 mL/hr at 03/26/21 1020 New Bag at 03/26/21 1020   vancomycin variable dose per unstable renal function (pharmacist dosing)   Does not apply See admin instructions Benita Gutter, RPH         Abtx:  Anti-infectives (From admission, onward)    Start     Dose/Rate Route Frequency Ordered Stop   03/25/21 1400  piperacillin-tazobactam (ZOSYN) IVPB 3.375 g        3.375 g 12.5 mL/hr over 240 Minutes Intravenous Every 8 hours 03/25/21 1158     03/25/21 1130  vancomycin variable dose per unstable renal function (pharmacist dosing)         Does  not apply See admin instructions 03/25/21 1130     03/25/21 0830  metroNIDAZOLE (FLAGYL) tablet 500 mg  Status:  Discontinued        500 mg Oral Every 12 hours 03/25/21 0800 03/25/21 1157   03/22/21 0000  ceFAZolin (ANCEF) IVPB 1 g/50 mL premix  Status:  Discontinued       Note to Pharmacy: To be given in specials   1 g 100 mL/hr over 30 Minutes Intravenous 60 min pre-op 03/21/21 1545 03/21/21 1724   03/21/21 1607  ceFAZolin (ANCEF) IVPB 1 g/50  mL premix  Status:  Discontinued        over 30 Minutes  Continuous PRN 03/21/21 1640 03/21/21 1724   03/21/21 1200  vancomycin (VANCOREADY) IVPB 750 mg/150 mL  Status:  Discontinued        750 mg 150 mL/hr over 60 Minutes Intravenous Every 24 hours 03/21/21 1008 03/21/21 1022   03/21/21 1200  vancomycin (VANCOCIN) IVPB 750 mg/150 ml premix  Status:  Discontinued        750 mg 150 mL/hr over 60 Minutes Intravenous Every 24 hours 03/21/21 1026 03/25/21 1130   03/21/21 1100  ceFEPIme (MAXIPIME) 2 g in sodium chloride 0.9 % 100 mL IVPB  Status:  Discontinued        2 g 200 mL/hr over 30 Minutes Intravenous Every 12 hours 03/21/21 1002 03/25/21 1116   03/20/21 1645  ceFEPIme (MAXIPIME) 2 g in sodium chloride 0.9 % 100 mL IVPB        2 g 200 mL/hr over 30 Minutes Intravenous  Once 03/20/21 1639 03/20/21 1756   03/20/21 1645  vancomycin (VANCOCIN) IVPB 1000 mg/200 mL premix        1,000 mg 200 mL/hr over 60 Minutes Intravenous  Once 03/20/21 1639 03/20/21 1912       REVIEW OF SYSTEMS:  Const: negative fever, negative chills, negative weight loss Eyes: negative diplopia or visual changes, negative eye pain ENT: negative coryza, negative sore throat Resp: negative cough, hemoptysis, dyspnea Cards: negative for chest pain, palpitations, lower extremity edema GU: negative for frequency, dysuria and hematuria GI: Negative for abdominal pain, diarrhea, bleeding, constipation Skin: negative for rash and pruritus Heme: negative for easy bruising and  gum/nose bleeding MS: negative for myalgias, arthralgias, back pain and muscle weakness Neurolo:negative for headaches, dizziness, vertigo, memory problems  Psych: negative for feelings of anxiety, depression  Endocrine: negative for thyroid, diabetes Allergy/Immunology- negative for any medication or food allergies ? Pertinent Positives include : Objective:  VITALS:  BP (!) 143/65 (BP Location: Left Arm)    Pulse 75    Temp 97.7 F (36.5 C)    Resp 15    Ht 5\' 7"  (1.702 m)    Wt 90.7 kg    SpO2 97%    BMI 31.32 kg/m  PHYSICAL EXAM:  General: Alert, cooperative, no distress, appears stated age.  Head: Normocephalic, without obvious abnormality, atraumatic. Eyes: Conjunctivae clear, anicteric sclerae. Pupils are equal ENT Nares normal. No drainage or sinus tenderness. Lips, mucosa, and tongue normal. No Thrush Neck: Supple, symmetrical, no adenopathy, thyroid: non tender no carotid bruit and no JVD. Back: No CVA tenderness. Lungs: Clear to auscultation bilaterally. No Wheezing or Rhonchi. No rales. Heart: Regular rate and rhythm, no murmur, rub or gallop. Abdomen: Soft, non-tender,not distended. Bowel sounds normal. No masses Extremities: atraumatic, no cyanosis. No edema. No clubbing Skin: No rashes or lesions. Or bruising Lymph: Cervical, supraclavicular normal. Neurologic: Grossly non-focal Pertinent Labs Lab Results CBC    Component Value Date/Time   WBC 9.2 03/26/2021 0411   RBC 2.85 (L) 03/26/2021 0411   HGB 8.4 (L) 03/26/2021 0411   HGB 10.7 (L) 04/17/2014 1131   HCT 26.7 (L) 03/26/2021 0411   HCT 32.9 (L) 04/17/2014 1131   PLT 193 03/26/2021 0411   PLT 226 04/17/2014 1131   MCV 93.7 03/26/2021 0411   MCV 91 04/17/2014 1131   MCH 29.5 03/26/2021 0411   MCHC 31.5 03/26/2021 0411   RDW 13.9 03/26/2021 0411   RDW 13.8 04/17/2014 1131  LYMPHSABS 2.2 03/26/2021 0411   LYMPHSABS 2.4 04/21/2013 0108   MONOABS 1.0 03/26/2021 0411   MONOABS 0.8 04/21/2013 0108    EOSABS 0.0 03/26/2021 0411   EOSABS 0.1 04/21/2013 0108   BASOSABS 0.1 03/26/2021 0411   BASOSABS 0.1 04/21/2013 0108    CMP Latest Ref Rng & Units 03/26/2021 03/25/2021 03/25/2021  Glucose 70 - 99 mg/dL 141(H) 80 -  BUN 8 - 23 mg/dL 38(H) 32(H) -  Creatinine 0.44 - 1.00 mg/dL 2.03(H) 1.81(H) 1.78(H)  Sodium 135 - 145 mmol/L 142 140 -  Potassium 3.5 - 5.1 mmol/L 4.1 4.5 -  Chloride 98 - 111 mmol/L 118(H) 114(H) -  CO2 22 - 32 mmol/L 14(L) 17(L) -  Calcium 8.9 - 10.3 mg/dL 8.9 8.9 -  Total Protein 6.5 - 8.1 g/dL 7.4 7.8 -  Total Bilirubin 0.3 - 1.2 mg/dL 1.4(H) 1.0 -  Alkaline Phos 38 - 126 U/L 66 66 -  AST 15 - 41 U/L 12(L) 12(L) -  ALT 0 - 44 U/L <5 7 -      Microbiology: Recent Results (from the past 240 hour(s))  Resp Panel by RT-PCR (Flu A&B, Covid) Nasopharyngeal Swab     Status: None   Collection Time: 03/20/21  5:07 PM   Specimen: Nasopharyngeal Swab; Nasopharyngeal(NP) swabs in vial transport medium  Result Value Ref Range Status   SARS Coronavirus 2 by RT PCR NEGATIVE NEGATIVE Final    Comment: (NOTE) SARS-CoV-2 target nucleic acids are NOT DETECTED.  The SARS-CoV-2 RNA is generally detectable in upper respiratory specimens during the acute phase of infection. The lowest concentration of SARS-CoV-2 viral copies this assay can detect is 138 copies/mL. A negative result does not preclude SARS-Cov-2 infection and should not be used as the sole basis for treatment or other patient management decisions. A negative result may occur with  improper specimen collection/handling, submission of specimen other than nasopharyngeal swab, presence of viral mutation(s) within the areas targeted by this assay, and inadequate number of viral copies(<138 copies/mL). A negative result must be combined with clinical observations, patient history, and epidemiological information. The expected result is Negative.  Fact Sheet for Patients:   EntrepreneurPulse.com.au  Fact Sheet for Healthcare Providers:  IncredibleEmployment.be  This test is no t yet approved or cleared by the [place] FDA and  has been authorized for detection and/or diagnosis of SARS-CoV-2 by FDA under an Emergency Use Authorization (EUA). This EUA will remain  in effect (meaning this test can be used) for the duration of the COVID-19 declaration under Section 564(b)(1) of the Act, 21 U.S.C.section 360bbb-3(b)(1), unless the authorization is terminated  or revoked sooner.       Influenza A by PCR NEGATIVE NEGATIVE Final   Influenza B by PCR NEGATIVE NEGATIVE Final    Comment: (NOTE) The Xpert Xpress SARS-CoV-2/FLU/RSV plus assay is intended as an aid in the diagnosis of influenza from Nasopharyngeal swab specimens and should not be used as a sole basis for treatment. Nasal washings and aspirates are unacceptable for Xpert Xpress SARS-CoV-2/FLU/RSV testing.  Fact Sheet for Patients: EntrepreneurPulse.com.au  Fact Sheet for Healthcare Providers: IncredibleEmployment.be  This test is not yet approved or cleared by the [place] FDA and has been authorized for detection and/or diagnosis of SARS-CoV-2 by FDA under an Emergency Use Authorization (EUA). This EUA will remain in effect (meaning this test can be used) for the duration of the COVID-19 declaration under Section 564(b)(1) of the Act, 21 U.S.C. section 360bbb-3(b)(1), unless the authorization is  terminated or revoked.  Performed at [facility], [address] [place], [place] [address]   MRSA Next Gen by PCR, Nasal     Status: Abnormal   Collection Time: 03/21/21  7:50 AM   Specimen: Nasal Mucosa; Nasal Swab  Result Value Ref Range Status   MRSA by PCR Next Gen DETECTED (A) NOT DETECTED Final    Comment: RESULT CALLED TO, READ BACK BY AND VERIFIED WITH: SANDY RODRIGUEZ 03/21/21 0954  KLW (NOTE) The GeneXpert MRSA Assay (FDA approved for NASAL specimens only), is one component of a comprehensive MRSA colonization surveillance program. It is not intended to diagnose MRSA infection nor to guide or monitor treatment for MRSA infections. Test performance is not FDA approved in patients less than 52 years old. Performed at [facility], [place], [place] [address]   Aerobic/Anaerobic Culture w Gram Stain (surgical/deep wound)     Status: None (Preliminary result)   Collection Time: 03/22/21  5:07 PM   Specimen: Biopsy  Result Value Ref Range Status   Specimen Description BIOPSY BONE LEFT HEEL  Final   Special Requests NONE  Final   Gram Stain   Final    NO SQUAMOUS EPITHELIAL CELLS SEEN FEW WBC SEEN NO ORGANISMS SEEN Performed at [facility], [place] [address] [place], [place] [address]    Culture   Final    RARE METHICILLIN RESISTANT STAPHYLOCOCCUS AUREUS RARE ENTEROCOCCUS FAECALIS NO ANAEROBES ISOLATED; CULTURE IN PROGRESS FOR 5 DAYS    Report Status PENDING  Incomplete   Organism ID, Bacteria METHICILLIN RESISTANT STAPHYLOCOCCUS AUREUS  Final   Organism ID, Bacteria ENTEROCOCCUS FAECALIS  Final      Susceptibility   Enterococcus faecalis - MIC*    AMPICILLIN <=2 SENSITIVE Sensitive     VANCOMYCIN 1 SENSITIVE Sensitive     GENTAMICIN SYNERGY RESISTANT Resistant     * RARE ENTEROCOCCUS FAECALIS   Methicillin resistant staphylococcus aureus - MIC*    CIPROFLOXACIN >=8 RESISTANT Resistant     ERYTHROMYCIN >=8 RESISTANT Resistant     GENTAMICIN <=0.5 SENSITIVE Sensitive     OXACILLIN >=4 RESISTANT Resistant     TETRACYCLINE <=1 SENSITIVE Sensitive     VANCOMYCIN <=0.5 SENSITIVE Sensitive     TRIMETH/SULFA >=320 RESISTANT Resistant     CLINDAMYCIN <=0.25 SENSITIVE Sensitive     RIFAMPIN <=0.5 SENSITIVE Sensitive     Inducible Clindamycin NEGATIVE Sensitive     * RARE METHICILLIN RESISTANT STAPHYLOCOCCUS AUREUS    IMAGING  RESULTS: I have personally reviewed the films ? Impression/Recommendation ? ? ? ___________________________________________________ Discussed with patient, requesting provider Note:  This document was prepared using Dragon voice recognition software and may include unintentional dictation errors.

## 2021-03-26 NOTE — Progress Notes (Signed)
Anticoagulation monitoring(Lovenox):  75yo  F ordered Lovenox 0.5 mg/kg (45 mg) Q24h    Filed Weights   03/20/21 1446  Weight: 90.7 kg (200 lb)   BMI 31.3  Lab Results  Component Value Date   CREATININE 2.03 (H) 03/26/2021   CREATININE 1.78 (H) 03/25/2021   CREATININE 1.81 (H) 03/25/2021   Estimated Creatinine Clearance: 28.1 mL/min (A) (by C-G formula based on SCr of 2.03 mg/dL (H)). Hemoglobin & Hematocrit     Component Value Date/Time   HGB 8.4 (L) 03/26/2021 0411   HGB 10.7 (L) 04/17/2014 1131   HCT 26.7 (L) 03/26/2021 0411   HCT 32.9 (L) 04/17/2014 1131     Per Protocol for Patient with estCrcl now <30 ml/min and BMI >30, will transition to Lovenox 40 mg Q24h      F/u renal fxn  Chinita Greenland PharmD Clinical Pharmacist 03/26/2021

## 2021-03-26 NOTE — Care Management Important Message (Signed)
Important Message  Patient Details  Name: [name] [name] MRN: [id number] Date of Birth: 03-31-1946   Medicare Important Message Given:  Yes     Juliann Pulse A Aliza Moret 03/26/2021, 2:07 PM

## 2021-03-26 NOTE — Assessment & Plan Note (Addendum)
Anemia labs Normal folate, low normal B12 Iron studies notable for AOCD

## 2021-03-26 NOTE — Progress Notes (Signed)
Eeg done 

## 2021-03-27 DIAGNOSIS — L89620 Pressure ulcer of left heel, unstageable: Secondary | ICD-10-CM

## 2021-03-27 LAB — CBC WITH DIFFERENTIAL/PLATELET
Abs Immature Granulocytes: 0.03 10*3/uL (ref 0.00–0.07)
Basophils Absolute: 0.1 10*3/uL (ref 0.0–0.1)
Basophils Relative: 1 %
Eosinophils Absolute: 0.2 10*3/uL (ref 0.0–0.5)
Eosinophils Relative: 3 %
HCT: 29.4 % — ABNORMAL LOW (ref 36.0–46.0)
Hemoglobin: 9.5 g/dL — ABNORMAL LOW (ref 12.0–15.0)
Immature Granulocytes: 0 %
Lymphocytes Relative: 28 %
Lymphs Abs: 2.4 10*3/uL (ref 0.7–4.0)
MCH: 29.8 pg (ref 26.0–34.0)
MCHC: 32.3 g/dL (ref 30.0–36.0)
MCV: 92.2 fL (ref 80.0–100.0)
Monocytes Absolute: 1 10*3/uL (ref 0.1–1.0)
Monocytes Relative: 12 %
Neutro Abs: 4.8 10*3/uL (ref 1.7–7.7)
Neutrophils Relative %: 56 %
Platelets: 201 10*3/uL (ref 150–400)
RBC: 3.19 MIL/uL — ABNORMAL LOW (ref 3.87–5.11)
RDW: 13.9 % (ref 11.5–15.5)
WBC: 8.5 10*3/uL (ref 4.0–10.5)
nRBC: 0 % (ref 0.0–0.2)

## 2021-03-27 LAB — COMPREHENSIVE METABOLIC PANEL
ALT: 6 U/L (ref 0–44)
AST: 14 U/L — ABNORMAL LOW (ref 15–41)
Albumin: 2.6 g/dL — ABNORMAL LOW (ref 3.5–5.0)
Alkaline Phosphatase: 60 U/L (ref 38–126)
Anion gap: 11 (ref 5–15)
BUN: 34 mg/dL — ABNORMAL HIGH (ref 8–23)
CO2: 22 mmol/L (ref 22–32)
Calcium: 9.1 mg/dL (ref 8.9–10.3)
Chloride: 111 mmol/L (ref 98–111)
Creatinine, Ser: 2.11 mg/dL — ABNORMAL HIGH (ref 0.44–1.00)
GFR, Estimated: 24 mL/min — ABNORMAL LOW (ref >60)
Glucose, Bld: 85 mg/dL (ref 70–99)
Potassium: 3.5 mmol/L (ref 3.5–5.1)
Sodium: 144 mmol/L (ref 135–145)
Total Bilirubin: 1 mg/dL (ref 0.3–1.2)
Total Protein: 7.5 g/dL (ref 6.5–8.1)

## 2021-03-27 LAB — TSH: TSH: 1.098 u[IU]/mL (ref 0.350–4.500)

## 2021-03-27 LAB — AEROBIC/ANAEROBIC CULTURE W GRAM STAIN (SURGICAL/DEEP WOUND): Gram Stain: NONE SEEN

## 2021-03-27 LAB — IRON AND TIBC
Iron: 45 ug/dL (ref 28–170)
Saturation Ratios: 32 % — ABNORMAL HIGH (ref 10.4–31.8)
TIBC: 140 ug/dL — ABNORMAL LOW (ref 250–450)
UIBC: 95 ug/dL

## 2021-03-27 LAB — GLUCOSE, CAPILLARY
Glucose-Capillary: 79 mg/dL (ref 70–99)
Glucose-Capillary: 85 mg/dL (ref 70–99)
Glucose-Capillary: 89 mg/dL (ref 70–99)
Glucose-Capillary: 73 mg/dL (ref 70–99)

## 2021-03-27 LAB — FERRITIN: Ferritin: 184 ng/mL (ref 11–307)

## 2021-03-27 LAB — PHOSPHORUS: Phosphorus: 3.5 mg/dL (ref 2.5–4.6)

## 2021-03-27 LAB — CK: Total CK: 35 U/L — ABNORMAL LOW (ref 38–234)

## 2021-03-27 LAB — MAGNESIUM: Magnesium: 2 mg/dL (ref 1.7–2.4)

## 2021-03-27 LAB — VANCOMYCIN, RANDOM: Vancomycin Rm: 22

## 2021-03-27 LAB — SURGICAL PATHOLOGY

## 2021-03-27 MED ORDER — ASPIRIN EC 81 MG PO TBEC
81.0000 mg | DELAYED_RELEASE_TABLET | Freq: Every day | ORAL | Status: DC
  Administered 2021-03-29 – 2021-03-31 (×3): 81 mg via ORAL
  Filled 2021-03-27 (×3): qty 1

## 2021-03-27 MED ORDER — ASPIRIN 300 MG RE SUPP
300.0000 mg | Freq: Every day | RECTAL | Status: DC
  Filled 2021-03-27 (×4): qty 1

## 2021-03-27 MED ORDER — LACTATED RINGERS IV SOLN: INTRAVENOUS | Status: DC

## 2021-03-27 NOTE — Progress Notes (Signed)
Eeg done 

## 2021-03-27 NOTE — Consult Note (Signed)
NAME: [name] [name]  DOB: October 26, 1945  MRN: [id number]  Date/Time: 03/27/2021 4:21 PM  REQUESTING PROVIDER: Dr.Powell Subjective:  REASON FOR CONSULT: left foot  infection ?No h/o from patient, spoke to son at bedside and chart reviewed [name] [name] is a 75 y.o. female with a history of diabetes mellitus, hypertension, CKD 4, hypothyroidism, Charcot foot, anemia presented from home on 03/20/2021 with left heel ulcer which has been worsening over the past several weeks. Patient lives at home with her son.  He is the caretaker. The nursing assistant who came home noticed that the foot wound was draining purulent material and she was asked to go to the ED.  Vitals in the ED BP 142/77, temperature 98.4, pulse 76 and respiratory rate 14. WBC was 9.1, Hb 10.8, platelet 231 and creatinine was 1.67.  X-ray of the foot showed no acute bony abnormality.  There was no evidence of osteomyelitis by plain films.  MRI done on 03/20/2021 showed a reactive marrow edema within the distal tibia, fibula and talus consistent with degenerative changes.  There was cortical irregularity over the calcaneus consistent with osteomyelitis.  Started on vancomycin and cefepime. As per son patient has not been walking since the past year because of falls.  She has got bilateral knee pain. On 12/14 she underwent an angio and percutaneous transluminal angioplasty of left peroneal artery and TP trunk with 2.5 mm balloon proximally and 2 mm balloon distally.  She was seen by podiatrist and on 12/15 underwent left heel debridement and bone biopsy Wound culture showed rare MRSA and Enterococcus faecalis.  Bone biopsy was devitalized bone with acute on chronic osteomyelitis. BKA recommended for extensive osteo- pt refused She also had Acute urinary retention- foley placed Tachyarrythmia and seen by cardiology As per son pt was fully awake and alert till Friday night when he was with her On 03/24/2021 t morning she was not talking ,  not eating Cefepime stopped on 12/18 as there was a concern could have been causing encephalopathy. Patient is currently on vancomycin and Zosyn. I am asked to see the patient for management of the left heel wound. Patient has been seen by neurologist.  MRI of the brain showed generalized parenchymal loss with lacunar infarct in the left lentiform nucleus and right caudate.  EEG was done yesterday and that showed moderate diffuse slowing indicative of global cerebral dysfunction.  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   Past Medical History:  Diagnosis Date   Chronic diastolic CHF (congestive heart failure), NYHA class 3 ([place]) 06/30/2019   Diabetes mellitus without complication ([place])    Humerus fracture 04/20/2018   left   Hyperlipidemia    Hypertension    Hypothyroidism    Vertigo    several yrs ago   Wears dentures    partial upper and lower (loose)    Past Surgical History:  Procedure Laterality Date   AMPUTATION TOE Right 05/31/2020   Procedure: AMPUTATION TOE;  Surgeon: Caroline More, DPM;  Location: [facility];  Service: Podiatry;  Laterality: Right;   CATARACT EXTRACTION W/PHACO Left 12/01/2018   Procedure: CATARACT EXTRACTION PHACO AND INTRAOCULAR LENS PLACEMENT ([place]) LEFT DIABETIC;  Surgeon: Birder Robson, MD;  Location: [facility];  Service: Ophthalmology;  Laterality: Left;  Diabetic - insulin   COLONOSCOPY WITH PROPOFOL N/A 07/07/2019   Procedure: COLONOSCOPY WITH PROPOFOL;  Surgeon: Robert Bellow, MD;  Location: [facility];  Service: Endoscopy;  Laterality: N/A;  ESOPHAGOGASTRODUODENOSCOPY (EGD) WITH PROPOFOL N/A 07/07/2019   Procedure: ESOPHAGOGASTRODUODENOSCOPY (EGD) WITH PROPOFOL;  Surgeon: Robert Bellow, MD;  Location: [facility];  Service: Endoscopy;  Laterality: N/A;   FOOT SURGERY Right    lesion excision   IRRIGATION AND DEBRIDEMENT FOOT Left 03/22/2021    Procedure: IRRIGATION AND DEBRIDEMENT FOOT AND BONE BIOPSY;  Surgeon: Felipa Furnace, DPM;  Location: [facility];  Service: Podiatry;  Laterality: Left;   LOWER EXTREMITY ANGIOGRAPHY Right 05/19/2020   Procedure: Lower Extremity Angiography;  Surgeon: Katha Cabal, MD;  Location: [facility];  Service: Cardiovascular;  Laterality: Right;   LOWER EXTREMITY ANGIOGRAPHY Right 05/30/2020   Procedure: Lower Extremity Angiography;  Surgeon: Katha Cabal, MD;  Location: [facility];  Service: Cardiovascular;  Laterality: Right;  Pedal Approach   LOWER EXTREMITY ANGIOGRAPHY Left 03/21/2021   Procedure: Lower Extremity Angiography;  Surgeon: Algernon Huxley, MD;  Location: [facility];  Service: Cardiovascular;  Laterality: Left;    Social History   Socioeconomic History   Marital status: Divorced    Spouse name: Not on file   Number of children: Not on file   Years of education: Not on file   Highest education level: Not on file  Occupational History   Not on file  Tobacco Use   Smoking status: Never   Smokeless tobacco: Never  Vaping Use   Vaping Use: Never used  Substance and Sexual Activity   Alcohol use: No   Drug use: Never   Sexual activity: Not on file  Other Topics Concern   Not on file  Social History Narrative   Not on file   Social Determinants of Health   Financial Resource Strain: Not on file  Food Insecurity: Not on file  Transportation Needs: Not on file  Physical Activity: Not on file  Stress: Not on file  Social Connections: Not on file  Intimate Partner Violence: Not on file    Family History  Problem Relation Age of Onset   Breast cancer Neg Hx    No Known Allergies I? Current Facility-Administered Medications  Medication Dose Route Frequency Provider Last Rate Last Admin   acetaminophen (TYLENOL) tablet 650 mg  650 mg Oral Q6H PRN Algernon Huxley, MD   650 mg at 03/23/21 1342   Or   acetaminophen (TYLENOL) suppository 650 mg   650 mg Rectal Q6H PRN Algernon Huxley, MD       amLODipine (NORVASC) tablet 10 mg  10 mg Oral Daily Algernon Huxley, MD   10 mg at 03/23/21 1031   aspirin chewable tablet 81 mg  81 mg Oral Daily Algernon Huxley, MD   81 mg at 03/23/21 1030   atorvastatin (LIPITOR) tablet 40 mg  40 mg Oral Daily Algernon Huxley, MD   40 mg at 03/23/21 1031   bisacodyl (DULCOLAX) suppository 10 mg  10 mg Rectal PRN Algernon Huxley, MD       Chlorhexidine Gluconate Cloth 2 % PADS 6 each  6 each Topical Daily Elodia Florence., MD   6 each at 03/26/21 0902   donepezil (ARICEPT) tablet 10 mg  10 mg Oral QHS Algernon Huxley, MD   10 mg at 03/24/21 2135   enoxaparin (LOVENOX) injection 40 mg  40 mg Subcutaneous Q24H Chinita Greenland A, RPH   40 mg at 03/26/21 2132   insulin aspart (novoLOG) injection 0-6 Units  0-6 Units Subcutaneous Q4H Alben Deeds  Brooke Bonito., MD       insulin glargine-yfgn [facility]) injection 5 Units  5 Units Subcutaneous Daily Elodia Florence., MD   5 Units at 03/26/21 1540   lactated ringers infusion   Intravenous Continuous Elodia Florence., MD       levETIRAcetam (KEPPRA) IVPB 500 mg/100 mL premix  500 mg Intravenous Q12H Derek Jack, MD 400 mL/hr at 03/27/21 0509 500 mg at 03/27/21 0867   levothyroxine (SYNTHROID, LEVOTHROID) injection 25 mcg  25 mcg Intravenous Daily Elodia Florence., MD   25 mcg at 03/27/21 0511   ondansetron [facility]) injection 4 mg  4 mg Intravenous Q6H PRN Algernon Huxley, MD   4 mg at 03/26/21 1017   polyethylene glycol (MIRALAX / GLYCOLAX) packet 17 g  17 g Oral Daily Algernon Huxley, MD   17 g at 03/23/21 1030   vancomycin variable dose per unstable renal function (pharmacist dosing)   Does not apply See admin instructions Benita Gutter, RPH         Abtx:  Anti-infectives (From admission, onward)    Start     Dose/Rate Route Frequency Ordered Stop   03/25/21 1400  piperacillin-tazobactam (ZOSYN) IVPB 3.375 g  Status:  Discontinued        3.375 g 12.5 mL/hr over  240 Minutes Intravenous Every 8 hours 03/25/21 1158 03/27/21 1548   03/25/21 1130  vancomycin variable dose per unstable renal function (pharmacist dosing)         Does not apply See admin instructions 03/25/21 1130     03/25/21 0830  metroNIDAZOLE (FLAGYL) tablet 500 mg  Status:  Discontinued        500 mg Oral Every 12 hours 03/25/21 0800 03/25/21 1157   03/22/21 0000  ceFAZolin (ANCEF) IVPB 1 g/50 mL premix  Status:  Discontinued       Note to Pharmacy: To be given in specials   1 g 100 mL/hr over 30 Minutes Intravenous 60 min pre-op 03/21/21 1545 03/21/21 1724   03/21/21 1607  ceFAZolin (ANCEF) IVPB 1 g/50 mL premix  Status:  Discontinued        over 30 Minutes  Continuous PRN 03/21/21 1640 03/21/21 1724   03/21/21 1200  vancomycin (VANCOREADY) IVPB 750 mg/150 mL  Status:  Discontinued        750 mg 150 mL/hr over 60 Minutes Intravenous Every 24 hours 03/21/21 1008 03/21/21 1022   03/21/21 1200  vancomycin (VANCOCIN) IVPB 750 mg/150 ml premix  Status:  Discontinued        750 mg 150 mL/hr over 60 Minutes Intravenous Every 24 hours 03/21/21 1026 03/25/21 1130   03/21/21 1100  ceFEPIme (MAXIPIME) 2 g in sodium chloride 0.9 % 100 mL IVPB  Status:  Discontinued        2 g 200 mL/hr over 30 Minutes Intravenous Every 12 hours 03/21/21 1002 03/25/21 1116   03/20/21 1645  ceFEPIme (MAXIPIME) 2 g in sodium chloride 0.9 % 100 mL IVPB        2 g 200 mL/hr over 30 Minutes Intravenous  Once 03/20/21 1639 03/20/21 1756   03/20/21 1645  vancomycin (VANCOCIN) IVPB 1000 mg/200 mL premix        1,000 mg 200 mL/hr over 60 Minutes Intravenous  Once 03/20/21 1639 03/20/21 1912       REVIEW OF SYSTEMS:  NA Objective:  VITALS:  BP (!) 150/56 (BP Location: Right Arm)    Pulse 75  Temp 98.2 F (36.8 C) (Oral)    Resp 18    Ht 5\' 7"  (1.702 m)    Wt 90.7 kg    SpO2 98%    BMI 31.32 kg/m  PHYSICAL EXAM:  General: Obtunded, opens eyes on calling her name.  But no appropriate verbal response.   Head:  Normocephalic, without obvious abnormality, atraumatic. Eyes: Conjunctivae clear, anicteric sclerae. Pupils are equal ENT Nares normal. No drainage or sinus tenderness. Dry oral mucosa Neck:  symmetrical, no adenopathy, thyroid: non tender no carotid bruit and no JVD.  Lungs: Bilateral air entry Heart: Irregular rhythm Abdomen: Soft, non-tender,not distended. Bowel sounds normal. No masses Extremities: Left heel ulcer clean with granulation tissue and some bleeding Does not probe to bone 03/27/21   Prior to debridement   Skin: No rashes or lesions. Or bruising Lymph: Cervical, supraclavicular normal. Neurologic: Cannot be assessed Pertinent Labs Lab Results CBC    Component Value Date/Time   WBC 8.5 03/27/2021 0500   RBC 3.19 (L) 03/27/2021 0500   HGB 9.5 (L) 03/27/2021 0500   HGB 10.7 (L) 04/17/2014 1131   HCT 29.4 (L) 03/27/2021 0500   HCT 32.9 (L) 04/17/2014 1131   PLT 201 03/27/2021 0500   PLT 226 04/17/2014 1131   MCV 92.2 03/27/2021 0500   MCV 91 04/17/2014 1131   MCH 29.8 03/27/2021 0500   MCHC 32.3 03/27/2021 0500   RDW 13.9 03/27/2021 0500   RDW 13.8 04/17/2014 1131   LYMPHSABS 2.4 03/27/2021 0500   LYMPHSABS 2.4 04/21/2013 0108   MONOABS 1.0 03/27/2021 0500   MONOABS 0.8 04/21/2013 0108   EOSABS 0.2 03/27/2021 0500   EOSABS 0.1 04/21/2013 0108   BASOSABS 0.1 03/27/2021 0500   BASOSABS 0.1 04/21/2013 0108    CMP Latest Ref Rng & Units 03/27/2021 03/26/2021 03/26/2021  Glucose 70 - 99 mg/dL 85 135(H) 141(H)  BUN 8 - 23 mg/dL 34(H) 36(H) 38(H)  Creatinine 0.44 - 1.00 mg/dL 2.11(H) 2.12(H) 2.03(H)  Sodium 135 - 145 mmol/L 144 140 142  Potassium 3.5 - 5.1 mmol/L 3.5 3.6 4.1  Chloride 98 - 111 mmol/L 111 113(H) 118(H)  CO2 22 - 32 mmol/L 22 17(L) 14(L)  Calcium 8.9 - 10.3 mg/dL 9.1 9.0 8.9  Total Protein 6.5 - 8.1 g/dL 7.5 - 7.4  Total Bilirubin 0.3 - 1.2 mg/dL 1.0 - 1.4(H)  Alkaline Phos 38 - 126 U/L 60 - 66  AST 15 - 41 U/L 14(L) - 12(L)  ALT 0 -  44 U/L 6 - <5      Microbiology: Recent Results (from the past 240 hour(s))  Resp Panel by RT-PCR (Flu A&B, Covid) Nasopharyngeal Swab     Status: None   Collection Time: 03/20/21  5:07 PM   Specimen: Nasopharyngeal Swab; Nasopharyngeal(NP) swabs in vial transport medium  Result Value Ref Range Status   SARS Coronavirus 2 by RT PCR NEGATIVE NEGATIVE Final    Comment: (NOTE) SARS-CoV-2 target nucleic acids are NOT DETECTED.  The SARS-CoV-2 RNA is generally detectable in upper respiratory specimens during the acute phase of infection. The lowest concentration of SARS-CoV-2 viral copies this assay can detect is 138 copies/mL. A negative result does not preclude SARS-Cov-2 infection and should not be used as the sole basis for treatment or other patient management decisions. A negative result may occur with  improper specimen collection/handling, submission of specimen other than nasopharyngeal swab, presence of viral mutation(s) within the areas targeted by this assay, and inadequate number of viral copies(<138 copies/mL).  A negative result must be combined with clinical observations, patient history, and epidemiological information. The expected result is Negative.  Fact Sheet for Patients:  EntrepreneurPulse.com.au  Fact Sheet for Healthcare Providers:  IncredibleEmployment.be  This test is no t yet approved or cleared by the [place] FDA and  has been authorized for detection and/or diagnosis of SARS-CoV-2 by FDA under an Emergency Use Authorization (EUA). This EUA will remain  in effect (meaning this test can be used) for the duration of the COVID-19 declaration under Section 564(b)(1) of the Act, 21 U.S.C.section 360bbb-3(b)(1), unless the authorization is terminated  or revoked sooner.       Influenza A by PCR NEGATIVE NEGATIVE Final   Influenza B by PCR NEGATIVE NEGATIVE Final    Comment: (NOTE) The Xpert Xpress  SARS-CoV-2/FLU/RSV plus assay is intended as an aid in the diagnosis of influenza from Nasopharyngeal swab specimens and should not be used as a sole basis for treatment. Nasal washings and aspirates are unacceptable for Xpert Xpress SARS-CoV-2/FLU/RSV testing.  Fact Sheet for Patients: EntrepreneurPulse.com.au  Fact Sheet for Healthcare Providers: IncredibleEmployment.be  This test is not yet approved or cleared by the [place] FDA and has been authorized for detection and/or diagnosis of SARS-CoV-2 by FDA under an Emergency Use Authorization (EUA). This EUA will remain in effect (meaning this test can be used) for the duration of the COVID-19 declaration under Section 564(b)(1) of the Act, 21 U.S.C. section 360bbb-3(b)(1), unless the authorization is terminated or revoked.  Performed at [facility], [address] [place], [place] [address]   MRSA Next Gen by PCR, Nasal     Status: Abnormal   Collection Time: 03/21/21  7:50 AM   Specimen: Nasal Mucosa; Nasal Swab  Result Value Ref Range Status   MRSA by PCR Next Gen DETECTED (A) NOT DETECTED Final    Comment: RESULT CALLED TO, READ BACK BY AND VERIFIED WITH: SANDY RODRIGUEZ 03/21/21 0954 KLW (NOTE) The GeneXpert MRSA Assay (FDA approved for NASAL specimens only), is one component of a comprehensive MRSA colonization surveillance program. It is not intended to diagnose MRSA infection nor to guide or monitor treatment for MRSA infections. Test performance is not FDA approved in patients less than 52 years old. Performed at [facility], [place], [place] [address]   Aerobic/Anaerobic Culture w Gram Stain (surgical/deep wound)     Status: None   Collection Time: 03/22/21  5:07 PM   Specimen: Biopsy  Result Value Ref Range Status   Specimen Description BIOPSY BONE LEFT HEEL  Final   Special Requests NONE  Final   Gram Stain   Final    NO SQUAMOUS  EPITHELIAL CELLS SEEN FEW WBC SEEN NO ORGANISMS SEEN    Culture   Final    RARE METHICILLIN RESISTANT STAPHYLOCOCCUS AUREUS RARE ENTEROCOCCUS FAECALIS NO ANAEROBES ISOLATED Performed at [facility], [place] [address] [place], [place] [address]    Report Status 03/27/2021 FINAL  Final   Organism ID, Bacteria METHICILLIN RESISTANT STAPHYLOCOCCUS AUREUS  Final   Organism ID, Bacteria ENTEROCOCCUS FAECALIS  Final      Susceptibility   Enterococcus faecalis - MIC*    AMPICILLIN <=2 SENSITIVE Sensitive     VANCOMYCIN 1 SENSITIVE Sensitive     GENTAMICIN SYNERGY RESISTANT Resistant     * RARE ENTEROCOCCUS FAECALIS   Methicillin resistant staphylococcus aureus - MIC*    CIPROFLOXACIN >=8 RESISTANT Resistant     ERYTHROMYCIN >=8 RESISTANT Resistant  GENTAMICIN <=0.5 SENSITIVE Sensitive     OXACILLIN >=4 RESISTANT Resistant     TETRACYCLINE <=1 SENSITIVE Sensitive     VANCOMYCIN <=0.5 SENSITIVE Sensitive     TRIMETH/SULFA >=320 RESISTANT Resistant     CLINDAMYCIN <=0.25 SENSITIVE Sensitive     RIFAMPIN <=0.5 SENSITIVE Sensitive     Inducible Clindamycin NEGATIVE Sensitive     * RARE METHICILLIN RESISTANT STAPHYLOCOCCUS AUREUS    IMAGING RESULTS: Large soft tissue ulceration overlying the calcaneus, with underlying calcaneal cortical irregularity and marrow edema consistent with osteomyelitis. 2. Extensive degenerative changes throughout the ankle and hindfoot.3. Diffuse soft tissue edema.  No fluid collection or abscess. MRI of the left foot reviewed I have personally reviewed the films ? Impression/Recommendation ? Left heel decubitus wound status postdebridement.  Bone biopsy shows acute on chronic osteomyelitis. Bone culture has MRSA and Enterococcus faecalis Patient refused BKA. Because of patient's immobility and poor medical condition will not do long-term IV antibiotic. Patient was on vancomycin and Zosyn.  Discontinue Zosyn Change vancomycin to daptomycin because  of  AKI on CKD.  Encephalopathy.  Concern for cefepime CNS side effect.  This has been discontinued on 03/25/2021. EEG showed changes suggestive of cerebral dysfunction as well as nonconvulsive seizures state There is possibility that she may be transferred to [facility] for continuous EEG monitoring  MRI showed brain parenchymal loss secondary to age as well as old lacunar infarcts.  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    AKI on CKD  Anemia  Hypothyroidism.  Check TSH.  Since her mental status change she has been off Synthroid and received small dose of IV this morning. ___________________________________________________ Discussed with son and requesting provider. Note:  This document was prepared using Dragon voice recognition software and may include unintentional dictation errors.

## 2021-03-27 NOTE — Progress Notes (Signed)
PROGRESS NOTE    [name] [name]  CWU:[id number] DOB: 05-25-1945 DOA: 03/20/2021 PCP: Marguerita Merles, MD  Chief Complaint  Patient presents with   Wound Infection    Brief Narrative:  75 year old with history of type 2 diabetes, hypertension, CKD stage IV, hypothyroidism, Charcot foot and peripheral arterial disease.  Presenting to the emergency room with left heel ulcer for several weeks.  Patient is mostly bedbound.  She uses wheelchair to get around.  Followed by podiatry as outpatient.  Admitted for surgical intervention.    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)   Anemia   Atrial tachycardia ([place])   Acute metabolic encephalopathy Minimally interactive since 12/17 Mildly improved today, able to respond with short phrases/individual words, continued abnormal movements of head, less myoclonic jerks noted on my exam Concern for encephalopathy related to cefepime    Head CT without acute findings EEG moderate diffuse slowing, abnormal pattern on ictal interictal continuum, concerning for nonconvulsive seizure activity MRI without acute findings Ammonia wnl, b12 low normal, follow MMA (will give x1 IM), folate wnl, follow TSH (pending) Discussed with neurology who will see her today  Osteomyelitis [facility]) As above  Diabetic foot ulcer ([place]) MRI 12/13 with large soft tissue ulceration overlying calcaneus with findings c/w osteomyelitis S/p angiogram 12/14 with percutaneous transluminal angioplasty of L peroneal artery and TP trunk  S/p I&D of foot on 12/15 with bone bx None bx from 12/15 with MRSA (vancomycin) and e  faecalis (sensitive to vanc/ampicillin)  Cefepime d/c'd due to encephalopathy.  Abx per ID (appears zosyn has been d/c'd).  Plan for dapto. C/s ID.  Appreciate vascular, podiatry, and ID input.  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) Hold basal dose, q4 SSI while encephalopathic  Hyperlipidemia lipitor  CKD stage 4 due to type 2 diabetes mellitus ([place]) Seems close to baseline, fluctuating here - up today, but still not far from baseline (fluctuates between 1.6-2 over past year) Metabolic acidosis resolved, transition to LR Adequate UOP  Hypothyroidism (acquired) Synthroid -> IV TSH pending  Essential hypertension amlodipine  Atrial tachycardia ([place]) Seen by cards (12/17 - noted irregular HR most c/w preatrial contractions, no evidence of advanced heart block)  Anemia Anemia labs Normal folate, low normal B12 Iron studies notable for AOCD   DVT prophylaxis: lovenox Code Status: full Family Communication: son Disposition:   Status is: Inpatient  Remains inpatient appropriate because: need for further w/u/interventions       Consultants:  Vascular Podiatry ID Neurology  Procedures:  IRRIGATION AND DEBRIDEMENT FOOT AND BONE BIOPSY (Left)  .  1. Ultrasound guidance for vascular access right femoral artery             2.  Catheter placement into left SFA from right femoral approach             3.  Aortogram and selective left lower extremity angiogram             4.  Percutaneous transluminal angioplasty of left peroneal artery and TP trunk with 2.5 mm  balloon proximally and 2 mm balloon distally             5. StarClose closure device right femoral artery Antimicrobials:  Anti-infectives (From admission, onward)    Start     Dose/Rate Route Frequency Ordered Stop   03/25/21 1400  piperacillin-tazobactam (ZOSYN) IVPB 3.375 g  Status:  Discontinued        3.375 g 12.5  mL/hr over 240 Minutes Intravenous Every 8 hours 03/25/21 1158 03/27/21 1548   03/25/21 1130  vancomycin variable dose per unstable renal function (pharmacist dosing)         Does not apply See admin instructions 03/25/21 1130     03/25/21 0830  metroNIDAZOLE (FLAGYL) tablet 500 mg  Status:  Discontinued        500 mg Oral Every 12 hours 03/25/21 0800 03/25/21 1157   03/22/21 0000  ceFAZolin (ANCEF) IVPB 1 g/50 mL premix  Status:  Discontinued       Note to Pharmacy: To be given in specials   1 g 100 mL/hr over 30 Minutes Intravenous 60 min pre-op 03/21/21 1545 03/21/21 1724   03/21/21 1607  ceFAZolin (ANCEF) IVPB 1 g/50 mL premix  Status:  Discontinued        over 30 Minutes  Continuous PRN 03/21/21 1640 03/21/21 1724   03/21/21 1200  vancomycin (VANCOREADY) IVPB 750 mg/150 mL  Status:  Discontinued        750 mg 150 mL/hr over 60 Minutes Intravenous Every 24 hours 03/21/21 1008 03/21/21 1022   03/21/21 1200  vancomycin (VANCOCIN) IVPB 750 mg/150 ml premix  Status:  Discontinued        750 mg 150 mL/hr over 60 Minutes Intravenous Every 24 hours 03/21/21 1026 03/25/21 1130   03/21/21 1100  ceFEPIme (MAXIPIME) 2 g in sodium chloride 0.9 % 100 mL IVPB  Status:  Discontinued        2 g 200 mL/hr over 30 Minutes Intravenous Every 12 hours 03/21/21 1002 03/25/21 1116   03/20/21 1645  ceFEPIme (MAXIPIME) 2 g in sodium chloride 0.9 % 100 mL IVPB        2 g 200 mL/hr over 30 Minutes Intravenous  Once 03/20/21 1639 03/20/21 1756   03/20/21 1645  vancomycin (VANCOCIN) IVPB 1000 mg/200 mL premix        1,000 mg 200 mL/hr over 60 Minutes Intravenous  Once 03/20/21 1639 03/20/21 1912       Subjective: Answers simple questions inconsistently  Objective: Vitals:   03/27/21 0455 03/27/21 0758 03/27/21 1136 03/27/21 1514  BP: 140/78 (!) 132/114 (!) 138/48 (!) 150/56  Pulse: 66 (!) 51 69 75  Resp: 20 18 18 18   Temp: 98 F (36.7 C) 97.6 F (36.4 C) 98 F (36.7 C) 98.2 F (36.8 C)  TempSrc:   Oral Oral Oral  SpO2: 100% 100% 97% 98%  Weight:      Height:        Intake/Output Summary (Last 24 hours) at 03/27/2021 1846 Last data filed at 03/27/2021 1800 Gross per 24 hour  Intake 1760.01 ml  Output 900 ml  Net 860.01 ml   Filed Weights   03/20/21 1446  Weight: 90.7 kg    Examination:  General: No acute distress. Cardiovascular: RRR Lungs: unlabored Abdomen: Soft, nontender, nondistended  Neurological: mildly more alert today, still significantly altered, attends to me, answers simple questions inconsistently, appears to try to lift her head and turn to look at me, then head falls back down repeatedly -  follows more commands today, globally weak Extremities: prafo boots, dressing  Data Reviewed: I have personally reviewed following labs and imaging studies  CBC: Recent Labs  Lab 03/21/21 0142 03/25/21 0416 03/26/21 0411 03/27/21 0500  WBC 8.2 9.0 9.2 8.5  NEUTROABS  --   --  6.0 4.8  HGB 9.7* 9.5* 8.4* 9.5*  HCT 29.9* 29.8* 26.7* 29.4*  MCV 90.9 92.5 93.7 92.2  PLT 216 212 193 858    Basic Metabolic Panel: Recent Labs  Lab 03/22/21 0424 03/25/21 0416 03/26/21 0411 03/26/21 1835 03/27/21 0500  NA 136 140 142 140 144  K 4.1 4.5 4.1 3.6 3.5  CL 107 114* 118* 113* 111  CO2 20* 17* 14* 17* 22  GLUCOSE 139* 80 141* 135* 85  BUN 30* 32* 38* 36* 34*  CREATININE 1.49* 1.81*   1.78* 2.03* 2.12* 2.11*  CALCIUM 8.7* 8.9 8.9 9.0 9.1  MG  --   --  2.0  --  2.0  PHOS  --   --  4.7*  --  3.5    GFR: Estimated Creatinine Clearance: 27 mL/min (Nam Vossler) (by C-G formula based on SCr of 2.11 mg/dL (H)).  Liver Function Tests: Recent Labs  Lab 03/25/21 0416 03/26/21 0411 03/27/21 0500  AST 12* 12* 14*  ALT 7 <5 6  ALKPHOS 66 66 60  BILITOT 1.0 1.4* 1.0  PROT 7.8 7.4 7.5  ALBUMIN 2.6* 2.4* 2.6*    CBG: Recent Labs  Lab 03/26/21 2020 03/26/21 2301 03/27/21 0325 03/27/21 0741 03/27/21 1136  GLUCAP 121* 110* 79 85 89     Recent Results (from the  past 240 hour(s))  Resp Panel by RT-PCR (Flu Chaslyn Eisen&B, Covid) Nasopharyngeal Swab     Status: None   Collection Time: 03/20/21  5:07 PM   Specimen: Nasopharyngeal Swab; Nasopharyngeal(NP) swabs in vial transport medium  Result Value Ref Range Status   SARS Coronavirus 2 by RT PCR NEGATIVE NEGATIVE Final    Comment: (NOTE) SARS-CoV-2 target nucleic acids are NOT DETECTED.  The SARS-CoV-2 RNA is generally detectable in upper respiratory specimens during the acute phase of infection. The lowest concentration of SARS-CoV-2 viral copies this assay can detect is 138 copies/mL. Merl Guardino negative result does not preclude SARS-Cov-2 infection and should not be used as the sole basis for treatment or other patient management decisions. Brindy Higginbotham negative result may occur with  improper specimen collection/handling, submission of specimen other than nasopharyngeal swab, presence of viral mutation(s) within the areas targeted by this assay, and inadequate number of viral copies(<138 copies/mL). Winola Drum negative result must be combined with clinical observations, patient history, and epidemiological information. The expected result is Negative.  Fact Sheet for Patients:  EntrepreneurPulse.com.au  Fact Sheet for Healthcare Providers:  IncredibleEmployment.be  This test is no t yet approved or cleared by the [place] FDA and  has been authorized for detection and/or diagnosis of SARS-CoV-2 by FDA under an Emergency Use Authorization (EUA). This EUA will remain  in effect (meaning this test can be used) for the duration of the COVID-19 declaration under Section 564(b)(1) of the Act, 21 U.S.C.section 360bbb-3(b)(1), unless the authorization is terminated  or revoked sooner.       Influenza Verania Salberg by PCR NEGATIVE NEGATIVE Final   Influenza B by PCR NEGATIVE NEGATIVE Final    Comment: (NOTE) The Xpert Xpress SARS-CoV-2/FLU/RSV plus assay is intended as an aid in the diagnosis of  influenza from Nasopharyngeal swab specimens and should not be used as Caelin Rosen sole basis for  treatment. Nasal washings and aspirates are unacceptable for Xpert Xpress SARS-CoV-2/FLU/RSV testing.  Fact Sheet for Patients: EntrepreneurPulse.com.au  Fact Sheet for Healthcare Providers: IncredibleEmployment.be  This test is not yet approved or cleared by the [place] FDA and has been authorized for detection and/or diagnosis of SARS-CoV-2 by FDA under an Emergency Use Authorization (EUA). This EUA will remain in effect (meaning this test can be used) for the duration of the COVID-19 declaration under Section 564(b)(1) of the Act, 21 U.S.C. section 360bbb-3(b)(1), unless the authorization is terminated or revoked.  Performed at [facility], [address] [place], [place] [address]   MRSA Next Gen by PCR, Nasal     Status: Abnormal   Collection Time: 03/21/21  7:50 AM   Specimen: Nasal Mucosa; Nasal Swab  Result Value Ref Range Status   MRSA by PCR Next Gen DETECTED (Ritamarie Arkin) NOT DETECTED Final    Comment: RESULT CALLED TO, READ BACK BY AND VERIFIED WITH: SANDY RODRIGUEZ 03/21/21 0954 KLW (NOTE) The GeneXpert MRSA Assay (FDA approved for NASAL specimens only), is one component of Chason Mciver comprehensive MRSA colonization surveillance program. It is not intended to diagnose MRSA infection nor to guide or monitor treatment for MRSA infections. Test performance is not FDA approved in patients less than 24 years old. Performed at [facility], [place], [place] [address]   Aerobic/Anaerobic Culture w Gram Stain (surgical/deep wound)     Status: None   Collection Time: 03/22/21  5:07 PM   Specimen: Biopsy  Result Value Ref Range Status   Specimen Description BIOPSY BONE LEFT HEEL  Final   Special Requests NONE  Final   Gram Stain   Final    NO SQUAMOUS EPITHELIAL CELLS SEEN FEW WBC SEEN NO ORGANISMS SEEN    Culture    Final    RARE METHICILLIN RESISTANT STAPHYLOCOCCUS AUREUS RARE ENTEROCOCCUS FAECALIS NO ANAEROBES ISOLATED Performed at [facility], [place] [address] [place], [place] [address]    Report Status 03/27/2021 FINAL  Final   Organism ID, Bacteria METHICILLIN RESISTANT STAPHYLOCOCCUS AUREUS  Final   Organism ID, Bacteria ENTEROCOCCUS FAECALIS  Final      Susceptibility   Enterococcus faecalis - MIC*    AMPICILLIN <=2 SENSITIVE Sensitive     VANCOMYCIN 1 SENSITIVE Sensitive     GENTAMICIN SYNERGY RESISTANT Resistant     * RARE ENTEROCOCCUS FAECALIS   Methicillin resistant staphylococcus aureus - MIC*    CIPROFLOXACIN >=8 RESISTANT Resistant     ERYTHROMYCIN >=8 RESISTANT Resistant     GENTAMICIN <=0.5 SENSITIVE Sensitive     OXACILLIN >=4 RESISTANT Resistant     TETRACYCLINE <=1 SENSITIVE Sensitive     VANCOMYCIN <=0.5 SENSITIVE Sensitive     TRIMETH/SULFA >=320 RESISTANT Resistant     CLINDAMYCIN <=0.25 SENSITIVE Sensitive     RIFAMPIN <=0.5 SENSITIVE Sensitive     Inducible Clindamycin NEGATIVE Sensitive     * RARE METHICILLIN RESISTANT STAPHYLOCOCCUS AUREUS         Radiology Studies: MR BRAIN WO CONTRAST  Result Date: 03/25/2021 CLINICAL DATA:  Delirium EXAM: MRI HEAD WITHOUT CONTRAST TECHNIQUE: Multiplanar, multiecho pulse sequences of the brain and surrounding structures were obtained without intravenous contrast. COMPARISON:  09/08/2018, correlation is also made with CT head 03/25/2021 FINDINGS: Brain: No restricted diffusion to suggest acute or subacute infarct. No hemorrhage, hydrocephalus, extra-axial collection, or mass lesion. Generalized parenchymal volume loss, with ex vacuo dilatation of the ventricles which appears largely unchanged compared to 2020.  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 T2 hyperintense signal in the periventricular white matter, likely the sequela of moderate chronic small vessel ischemic disease. Vascular: Normal flow  voids. Skull and upper cervical spine: Normal marrow signal. Sinuses/Orbits: Mucosal thickening in the maxillary sinuses and ethmoid air cells. Status post bilateral lens replacements. Other: Trace fluid in right mastoid air cells. IMPRESSION: No acute intracranial process. Electronically Signed   By: Merilyn Baba M.D.   On: 03/25/2021 23:52   EEG adult  Result Date: 03/26/2021 Derek Jack, MD     03/26/2021  9:06 PM Routine EEG Report QUINNETTA ROEPKE is Rebel Laughridge 75 y.o. female with Charly Holcomb history of encephalopathy who is undergoing an EEG to evaluate for seizures. Report: This EEG was acquired with electrodes placed according to the International 10-20 electrode system (including Fp1, Fp2, F3, F4, C3, C4, P3, P4, O1, O2, T3, T4, T5, T6, A1, A2, Fz, Cz, Pz). The following electrodes were missing or displaced: none. The best background was 4-5 Hz. This activity is reactive to stimulation. No sleep architecture was identified. There was no focal slowing. There are continuous triphasic waves throughout the recording, initially at 1 Hz and later in the recording 2-2.5 Hz. The discharges are highly rhythmic and some runs appear to have subtle evolution and be followed by Jahnay Lantier 1-2 second period of diffuse relative suppression. Photic stimulation and hyperventilation were not performed. Impression and clinical correlation: This EEG was obtained while obtunded and is abnormal due to: - Moderate diffuse slowing indicative of global cerebral dysfunction - Continuous triphasic waves, at times up to 2-2.5 Hz, at times in runs displaying subtle evolution and followed by Shalice Woodring 1-2 second period of relative suppression. This pattern falls on the ictal-interictal continuum and is concerning for nonconvulsive seizure activity. Su Monks, MD [facility] [phone] If 7pm- 7am, please page neurology on call as listed in [place].        Scheduled Meds:  amLODipine  10 mg Oral Daily   aspirin  81 mg Oral Daily    atorvastatin  40 mg Oral Daily   Chlorhexidine Gluconate Cloth  6 each Topical Daily   donepezil  10 mg Oral QHS   enoxaparin (LOVENOX) injection  40 mg Subcutaneous Q24H   insulin aspart  0-6 Units Subcutaneous Q4H   levothyroxine  25 mcg Intravenous Daily   polyethylene glycol  17 g Oral Daily   vancomycin variable dose per unstable renal function (pharmacist dosing)   Does not apply See admin instructions   Continuous Infusions:  lactated ringers     levETIRAcetam 500 mg (03/27/21 0509)     LOS: 6 days    Time spent: over 30 min    Fayrene Helper, MD [facility]   To contact the attending provider between 7A-7P or the covering provider during after hours 7P-7A, please log into the web site [email or web address] and access using universal [place] password for that web site. If you do not have the password, please call the hospital operator.  03/27/2021, 6:46 PM

## 2021-03-27 NOTE — Plan of Care (Signed)
°  Problem: Education: Goal: Knowledge of General Education information will improve Description: Including pain rating scale, medication(s)/side effects and non-pharmacologic comfort measures Outcome: Progressing   Problem: Health Behavior/Discharge Planning: Goal: Ability to manage health-related needs will improve Outcome: Progressing   Problem: Clinical Measurements: Goal: Ability to maintain clinical measurements within normal limits will improve Outcome: Progressing Goal: Will remain free from infection Outcome: Progressing Goal: Diagnostic test results will improve Outcome: Progressing Goal: Respiratory complications will improve Outcome: Progressing Goal: Cardiovascular complication will be avoided Outcome: Progressing   Problem: Activity: Goal: Risk for activity intolerance will decrease Outcome: Progressing   Problem: Nutrition: Goal: Adequate nutrition will be maintained Outcome: Progressing   Problem: Coping: Goal: Level of anxiety will decrease Outcome: Progressing   Problem: Elimination: Goal: Will not experience complications related to bowel motility Outcome: Progressing Goal: Will not experience complications related to urinary retention Outcome: Progressing   Problem: Pain Managment: Goal: General experience of comfort will improve Outcome: Progressing   Problem: Safety: Goal: Ability to remain free from injury will improve Outcome: Progressing   Problem: Skin Integrity: Goal: Risk for impaired skin integrity will decrease Outcome: Progressing   Problem: Education: Goal: Ability to describe self-care measures that may prevent or decrease complications (Diabetes Survival Skills Education) will improve Outcome: Progressing Goal: Individualized Educational Video(s) Outcome: Progressing   Problem: Coping: Goal: Ability to adjust to condition or change in health will improve Outcome: Progressing   Problem: Fluid Volume: Goal: Ability to  maintain a balanced intake and output will improve Outcome: Progressing   Problem: Health Behavior/Discharge Planning: Goal: Ability to identify and utilize available resources and services will improve Outcome: Progressing Goal: Ability to manage health-related needs will improve Outcome: Progressing   Problem: Metabolic: Goal: Ability to maintain appropriate glucose levels will improve Outcome: Progressing   Problem: Nutritional: Goal: Maintenance of adequate nutrition will improve Outcome: Progressing Goal: Progress toward achieving an optimal weight will improve Outcome: Progressing   Problem: Skin Integrity: Goal: Risk for impaired skin integrity will decrease Outcome: Progressing   Problem: Tissue Perfusion: Goal: Adequacy of tissue perfusion will improve Outcome: Progressing   Problem: Nutrition Goal: Patient maintains adequate hydration Outcome: Progressing Goal: Patient maintains weight Outcome: Progressing Goal: Patient/Family demonstrates understanding of diet Outcome: Progressing Goal: Patient/Family independently completes tube feeding Outcome: Progressing Goal: Patient will have no more than 5 lb weight change during LOS Outcome: Progressing Goal: Patient will utilize adaptive techniques to administer nutrition Outcome: Progressing Goal: Patient will verbalize dietary restrictions Outcome: Progressing

## 2021-03-27 NOTE — Progress Notes (Signed)
PROGRESS NOTE    [name] [name]  PZW:[id number] DOB: Dec 04, 1945 DOA: 03/20/2021 PCP: Marguerita Merles, MD  Chief Complaint  Patient presents with   Wound Infection    Brief Narrative:  75 year old with history of type 2 diabetes, hypertension, CKD stage IV, hypothyroidism, Charcot foot and peripheral arterial disease.  Presenting to the emergency room with left heel ulcer for several weeks.  Patient is mostly bedbound.  She uses wheelchair to get around.  Followed by podiatry as outpatient.  Admitted for surgical intervention.  She was diagnosed with osteomyelitis of the left calcaneus, seen by vascular and underwent angiogram and stenting.  Podiatry performed irrigation and debridement of foot and bone biopsy.  Amputation was recommended, but patient reportedly not interested.  She developed encephalopathy and neurology was consulted.  Mild improvement noted from 12/19 to 12/20.  Workup underway.  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)   Anemia   Atrial tachycardia ([name])   Acute metabolic encephalopathy Minimally interactive since 12/17 Mildly improved today, able to respond with short phrases/individual words, continued abnormal movements of head, less myoclonic jerks noted on my exam Concern for encephalopathy related to cefepime    Head CT without acute findings EEG moderate diffuse slowing, abnormal pattern on ictal interictal continuum, concerning for nonconvulsive seizure activity Repeat EEG 12/20 pending MRI without acute findings Ammonia wnl, b12 low normal, follow MMA (will give  x1 IM), folate wnl, follow TSH (pending) Neurology c/s, appreciate recs - keppra, repeat EEG  Osteomyelitis ([place]) As above  Diabetic foot ulcer ([place]) MRI 12/13 with large soft tissue ulceration overlying calcaneus with findings c/w osteomyelitis S/p angiogram 12/14 with percutaneous transluminal angioplasty of L peroneal artery and TP trunk (aspirin PR until she's taking PO safely) S/p I&D of foot on 12/15 with bone bx None bx from 12/15 with MRSA (vancomycin) and e faecalis (sensitive to vanc/ampicillin)  Cefepime d/c'd due to encephalopathy.  Abx per ID (appears zosyn has been d/c'd).  Plan for dapto. C/s ID.  Appreciate vascular, podiatry, and ID input.  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) Hold basal dose, q4 SSI while encephalopathic  Hyperlipidemia lipitor  CKD stage 4 due to type 2 diabetes mellitus ([place]) Seems close to baseline, fluctuating here - up today, but still not far from baseline (fluctuates between 1.6-2 over past year) Metabolic acidosis resolved, transition to LR Adequate UOP  Hypothyroidism (acquired) Synthroid -> IV TSH pending  Essential hypertension amlodipine  Atrial tachycardia ([place]) Seen by cards (12/17 - noted irregular HR most c/w preatrial contractions, no evidence of advanced heart block)  Anemia Anemia labs Normal folate, low normal B12 Iron studies notable for AOCD   DVT prophylaxis: lovenox Code Status: full Family Communication: son Disposition:   Status is: Inpatient  Remains inpatient appropriate because: need for further w/u/interventions       Consultants:  Vascular Podiatry ID Neurology  Procedures:  IRRIGATION AND DEBRIDEMENT FOOT AND BONE BIOPSY (Left)  .  1. Ultrasound guidance for vascular access right  femoral artery             2.  Catheter placement into left SFA from right femoral approach             3.   Aortogram and selective left lower extremity angiogram             4.  Percutaneous transluminal angioplasty of left peroneal artery and TP trunk with 2.5 mm balloon proximally and 2 mm balloon distally             5. StarClose closure device right femoral artery Antimicrobials:  Anti-infectives (From admission, onward)    Start     Dose/Rate Route Frequency Ordered Stop   03/25/21 1400  piperacillin-tazobactam (ZOSYN) IVPB 3.375 g  Status:  Discontinued        3.375 g 12.5 mL/hr over 240 Minutes Intravenous Every 8 hours 03/25/21 1158 03/27/21 1548   03/25/21 1130  vancomycin variable dose per unstable renal function (pharmacist dosing)         Does not apply See admin instructions 03/25/21 1130     03/25/21 0830  metroNIDAZOLE (FLAGYL) tablet 500 mg  Status:  Discontinued        500 mg Oral Every 12 hours 03/25/21 0800 03/25/21 1157   03/22/21 0000  ceFAZolin (ANCEF) IVPB 1 g/50 mL premix  Status:  Discontinued       Note to Pharmacy: To be given in specials   1 g 100 mL/hr over 30 Minutes Intravenous 60 min pre-op 03/21/21 1545 03/21/21 1724   03/21/21 1607  ceFAZolin (ANCEF) IVPB 1 g/50 mL premix  Status:  Discontinued        over 30 Minutes  Continuous PRN 03/21/21 1640 03/21/21 1724   03/21/21 1200  vancomycin (VANCOREADY) IVPB 750 mg/150 mL  Status:  Discontinued        750 mg 150 mL/hr over 60 Minutes Intravenous Every 24 hours 03/21/21 1008 03/21/21 1022   03/21/21 1200  vancomycin (VANCOCIN) IVPB 750 mg/150 ml premix  Status:  Discontinued        750 mg 150 mL/hr over 60 Minutes Intravenous Every 24 hours 03/21/21 1026 03/25/21 1130   03/21/21 1100  ceFEPIme (MAXIPIME) 2 g in sodium chloride 0.9 % 100 mL IVPB  Status:  Discontinued        2 g 200 mL/hr over 30 Minutes Intravenous Every 12 hours 03/21/21 1002 03/25/21 1116   03/20/21 1645  ceFEPIme (MAXIPIME) 2 g in sodium chloride 0.9 % 100 mL IVPB        2 g 200 mL/hr over 30 Minutes Intravenous  Once 03/20/21 1639 03/20/21  1756   03/20/21 1645  vancomycin (VANCOCIN) IVPB 1000 mg/200 mL premix        1,000 mg 200 mL/hr over 60 Minutes Intravenous  Once 03/20/21 1639 03/20/21 1912       Subjective: Answers simple questions inconsistently  Objective: Vitals:   03/27/21 0455 03/27/21 0758 03/27/21 1136 03/27/21 1514  BP: 140/78 (!) 132/114 (!) 138/48 (!) 150/56  Pulse: 66 (!) 51 69 75  Resp: 20 18 18 18   Temp: 98 F (36.7 C) 97.6 F (36.4 C) 98 F (36.7 C) 98.2 F (36.8 C)  TempSrc:  Oral Oral Oral  SpO2: 100% 100% 97% 98%  Weight:      Height:        Intake/Output Summary (Last 24 hours) at 03/27/2021 1858 Last data filed at 03/27/2021 1800 Gross per 24 hour  Intake 1760.01  ml  Output 900 ml  Net 860.01 ml   Filed Weights   03/20/21 1446  Weight: 90.7 kg    Examination:  General: No acute distress. Cardiovascular: RRR Lungs: unlabored Abdomen: Soft, nontender, nondistended  Neurological: mildly more alert today, still significantly altered, attends to me, answers simple questions inconsistently, appears to try to lift her head and turn to look at me, then head falls back down repeatedly - follows more commands today, globally weak Extremities: prafo boots, dressing  Data Reviewed: I have personally reviewed following labs and imaging studies  CBC: Recent Labs  Lab 03/21/21 0142 03/25/21 0416 03/26/21 0411 03/27/21 0500  WBC 8.2 9.0 9.2 8.5  NEUTROABS  --   --  6.0 4.8  HGB 9.7* 9.5* 8.4* 9.5*  HCT 29.9* 29.8* 26.7* 29.4*  MCV 90.9 92.5 93.7 92.2  PLT 216 212 193 786    Basic Metabolic Panel: Recent Labs  Lab 03/22/21 0424 03/25/21 0416 03/26/21 0411 03/26/21 1835 03/27/21 0500  NA 136 140 142 140 144  K 4.1 4.5 4.1 3.6 3.5  CL 107 114* 118* 113* 111  CO2 20* 17* 14* 17* 22  GLUCOSE 139* 80 141* 135* 85  BUN 30* 32* 38* 36* 34*  CREATININE 1.49* 1.81*   1.78* 2.03* 2.12* 2.11*  CALCIUM 8.7* 8.9 8.9 9.0 9.1  MG  --   --  2.0  --  2.0  PHOS  --   --  4.7*  --   3.5    GFR: Estimated Creatinine Clearance: 27 mL/min (Deiona Hooper) (by C-G formula based on SCr of 2.11 mg/dL (H)).  Liver Function Tests: Recent Labs  Lab 03/25/21 0416 03/26/21 0411 03/27/21 0500  AST 12* 12* 14*  ALT 7 <5 6  ALKPHOS 66 66 60  BILITOT 1.0 1.4* 1.0  PROT 7.8 7.4 7.5  ALBUMIN 2.6* 2.4* 2.6*    CBG: Recent Labs  Lab 03/26/21 2020 03/26/21 2301 03/27/21 0325 03/27/21 0741 03/27/21 1136  GLUCAP 121* 110* 79 85 89     Recent Results (from the past 240 hour(s))  Resp Panel by RT-PCR (Flu Valin Massie&B, Covid) Nasopharyngeal Swab     Status: None   Collection Time: 03/20/21  5:07 PM   Specimen: Nasopharyngeal Swab; Nasopharyngeal(NP) swabs in vial transport medium  Result Value Ref Range Status   SARS Coronavirus 2 by RT PCR NEGATIVE NEGATIVE Final    Comment: (NOTE) SARS-CoV-2 target nucleic acids are NOT DETECTED.  The SARS-CoV-2 RNA is generally detectable in upper respiratory specimens during the acute phase of infection. The lowest concentration of SARS-CoV-2 viral copies this assay can detect is 138 copies/mL. Isabela Nardelli negative result does not preclude SARS-Cov-2 infection and should not be used as the sole basis for treatment or other patient management decisions. Ramonita Koenig negative result may occur with  improper specimen collection/handling, submission of specimen other than nasopharyngeal swab, presence of viral mutation(s) within the areas targeted by this assay, and inadequate number of viral copies(<138 copies/mL). Reford Olliff negative result must be combined with clinical observations, patient history, and epidemiological information. The expected result is Negative.  Fact Sheet for Patients:  EntrepreneurPulse.com.au  Fact Sheet for Healthcare Providers:  IncredibleEmployment.be  This test is no t yet approved or cleared by the [place] FDA and  has been authorized for detection and/or diagnosis of SARS-CoV-2 by FDA under an  Emergency Use Authorization (EUA). This EUA will remain  in effect (meaning this test can be used) for the duration of the COVID-19 declaration under  Section 564(b)(1) of the Act, 21 U.S.C.section 360bbb-3(b)(1), unless the authorization is terminated  or revoked sooner.       Influenza Catalyna Reilly by PCR NEGATIVE NEGATIVE Final   Influenza B by PCR NEGATIVE NEGATIVE Final    Comment: (NOTE) The Xpert Xpress SARS-CoV-2/FLU/RSV plus assay is intended as an aid in the diagnosis of influenza from Nasopharyngeal swab specimens and should not be used as Lavona Norsworthy sole basis for treatment. Nasal washings and aspirates are unacceptable for Xpert Xpress SARS-CoV-2/FLU/RSV testing.  Fact Sheet for Patients: EntrepreneurPulse.com.au  Fact Sheet for Healthcare Providers: IncredibleEmployment.be  This test is not yet approved or cleared by the [place] FDA and has been authorized for detection and/or diagnosis of SARS-CoV-2 by FDA under an Emergency Use Authorization (EUA). This EUA will remain in effect (meaning this test can be used) for the duration of the COVID-19 declaration under Section 564(b)(1) of the Act, 21 U.S.C. section 360bbb-3(b)(1), unless the authorization is terminated or revoked.  Performed at [facility], [address] [place], [place] [address]   MRSA Next Gen by PCR, Nasal     Status: Abnormal   Collection Time: 03/21/21  7:50 AM   Specimen: Nasal Mucosa; Nasal Swab  Result Value Ref Range Status   MRSA by PCR Next Gen DETECTED (Janaysia Mcleroy) NOT DETECTED Final    Comment: RESULT CALLED TO, READ BACK BY AND VERIFIED WITH: SANDY RODRIGUEZ 03/21/21 0954 KLW (NOTE) The GeneXpert MRSA Assay (FDA approved for NASAL specimens only), is one component of Chasady Longwell comprehensive MRSA colonization surveillance program. It is not intended to diagnose MRSA infection nor to guide or monitor treatment for MRSA infections. Test performance is not FDA approved  in patients less than 4 years old. Performed at [facility], [place], [place] [address]   Aerobic/Anaerobic Culture w Gram Stain (surgical/deep wound)     Status: None   Collection Time: 03/22/21  5:07 PM   Specimen: Biopsy  Result Value Ref Range Status   Specimen Description BIOPSY BONE LEFT HEEL  Final   Special Requests NONE  Final   Gram Stain   Final    NO SQUAMOUS EPITHELIAL CELLS SEEN FEW WBC SEEN NO ORGANISMS SEEN    Culture   Final    RARE METHICILLIN RESISTANT STAPHYLOCOCCUS AUREUS RARE ENTEROCOCCUS FAECALIS NO ANAEROBES ISOLATED Performed at [facility], [place] [address] [place], [place] [address]    Report Status 03/27/2021 FINAL  Final   Organism ID, Bacteria METHICILLIN RESISTANT STAPHYLOCOCCUS AUREUS  Final   Organism ID, Bacteria ENTEROCOCCUS FAECALIS  Final      Susceptibility   Enterococcus faecalis - MIC*    AMPICILLIN <=2 SENSITIVE Sensitive     VANCOMYCIN 1 SENSITIVE Sensitive     GENTAMICIN SYNERGY RESISTANT Resistant     * RARE ENTEROCOCCUS FAECALIS   Methicillin resistant staphylococcus aureus - MIC*    CIPROFLOXACIN >=8 RESISTANT Resistant     ERYTHROMYCIN >=8 RESISTANT Resistant     GENTAMICIN <=0.5 SENSITIVE Sensitive     OXACILLIN >=4 RESISTANT Resistant     TETRACYCLINE <=1 SENSITIVE Sensitive     VANCOMYCIN <=0.5 SENSITIVE Sensitive     TRIMETH/SULFA >=320 RESISTANT Resistant     CLINDAMYCIN <=0.25 SENSITIVE Sensitive     RIFAMPIN <=0.5 SENSITIVE Sensitive     Inducible Clindamycin NEGATIVE Sensitive     * RARE METHICILLIN RESISTANT STAPHYLOCOCCUS AUREUS         Radiology Studies: MR BRAIN WO CONTRAST  Result Date:  03/25/2021 CLINICAL DATA:  Delirium EXAM: MRI HEAD WITHOUT CONTRAST TECHNIQUE: Multiplanar, multiecho pulse sequences of the brain and surrounding structures were obtained without intravenous contrast. COMPARISON:  09/08/2018, correlation is also made with CT head 03/25/2021 FINDINGS:  Brain: No restricted diffusion to suggest acute or subacute infarct. No hemorrhage, hydrocephalus, extra-axial collection, or mass lesion. Generalized parenchymal volume loss, with ex vacuo dilatation of the ventricles which appears largely unchanged compared to 2020. 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 T2 hyperintense signal in the periventricular white matter, likely the sequela of moderate chronic small vessel ischemic disease. Vascular: Normal flow voids. Skull and upper cervical spine: Normal marrow signal. Sinuses/Orbits: Mucosal thickening in the maxillary sinuses and ethmoid air cells. Status post bilateral lens replacements. Other: Trace fluid in right mastoid air cells. IMPRESSION: No acute intracranial process. Electronically Signed   By: Merilyn Baba M.D.   On: 03/25/2021 23:52   EEG adult  Result Date: 03/26/2021 Derek Jack, MD     03/26/2021  9:06 PM Routine EEG Report KARE DADO is Reshad Saab 75 y.o. female with Aahil Fredin history of encephalopathy who is undergoing an EEG to evaluate for seizures. Report: This EEG was acquired with electrodes placed according to the International 10-20 electrode system (including Fp1, Fp2, F3, F4, C3, C4, P3, P4, O1, O2, T3, T4, T5, T6, A1, A2, Fz, Cz, Pz). The following electrodes were missing or displaced: none. The best background was 4-5 Hz. This activity is reactive to stimulation. No sleep architecture was identified. There was no focal slowing. There are continuous triphasic waves throughout the recording, initially at 1 Hz and later in the recording 2-2.5 Hz. The discharges are highly rhythmic and some runs appear to have subtle evolution and be followed by Alegandra Sommers 1-2 second period of diffuse relative suppression. Photic stimulation and hyperventilation were not performed. Impression and clinical correlation: This EEG was obtained while obtunded and is abnormal due to: - Moderate diffuse slowing indicative of global cerebral dysfunction  - Continuous triphasic waves, at times up to 2-2.5 Hz, at times in runs displaying subtle evolution and followed by Javien Tesch 1-2 second period of relative suppression. This pattern falls on the ictal-interictal continuum and is concerning for nonconvulsive seizure activity. Su Monks, MD [facility] [phone] If 7pm- 7am, please page neurology on call as listed in [place].        Scheduled Meds:  amLODipine  10 mg Oral Daily   aspirin EC  81 mg Oral Daily   Or   aspirin  300 mg Rectal Daily   atorvastatin  40 mg Oral Daily   Chlorhexidine Gluconate Cloth  6 each Topical Daily   donepezil  10 mg Oral QHS   enoxaparin (LOVENOX) injection  40 mg Subcutaneous Q24H   insulin aspart  0-6 Units Subcutaneous Q4H   levothyroxine  25 mcg Intravenous Daily   polyethylene glycol  17 g Oral Daily   vancomycin variable dose per unstable renal function (pharmacist dosing)   Does not apply See admin instructions   Continuous Infusions:  lactated ringers     levETIRAcetam 500 mg (03/27/21 0509)     LOS: 6 days    Time spent: over 30 min    Fayrene Helper, MD [facility]   To contact the attending provider between 7A-7P or the covering provider during after hours 7P-7A, please log into the web site [email or web address] and access using universal [place] password for that web site. If you do not have the  password, please call the hospital operator.  03/27/2021, 6:58 PM

## 2021-03-27 NOTE — Progress Notes (Signed)
Pharmacy Antibiotic Note  [name] [name] is a 75 y.o. female admitted on 03/20/2021 with  DM foot infection/Osteomyelitis .  Pharmacy has been consulted for daptomycin and Zosyn dosing.  Patient is s/p irrigation and debridement and bone biopsy of left foot wound on 12/15. Has OM in the calcaneus and medullary canal. Vascular consulted for BKA but patient unwilling to proceed at this point.   Scr with slight bump today. Hyperchloremic NAGMA noted on BMP. Likely secondary to normal saline versus worsening renal function from vancomycin, ID consulted and based on renal function, will change vancomycin to daptomycin when vancomycin levels appropriate.    Steady State PK 12/17&12/18: Vpk: 40 (drawn ~1 hr after completion of infusion) Vtr: 31 (drawn ~ 3 hr early) AUC: 834, Cmax 40.7, Cmin 29.4, t1/2 49h  03/27/2021 CK = 35 today Renal: SCR unchanged at 2.11 Random vancomycin level = 55mcg/mL (last vancomycin 750mg  q24h dose was given 12/17 at 14:27.  Based on two random levels 24.75hr apart, half-life ~135hrs  Plan: - Daptomycin initiation pending vancomycin levels, Ideally like to wait until vanco levels approach 15 mcg/mL.  Can give earlier if being discharged.  Vancomycin level ordered for morning.    Check CK weekly (patient on atorvastatin 40mg ) for hyperlipidemia Stopping piperacillin/tazobactam for now   Height: 5\' 7"  (170.2 cm) Weight: 90.7 kg (200 lb) IBW/kg (Calculated) : 61.6  Temp (24hrs), Avg:97.8 F (36.6 C), Min:97.6 F (36.4 C), Max:98 F (36.7 C)  Recent Labs  Lab 03/20/21 1446 03/21/21 0142 03/22/21 0424 03/24/21 1647 03/25/21 0416 03/25/21 1050 03/26/21 0411 03/26/21 1835 03/27/21 0500  WBC 9.1 8.2  --   --  9.0  --  9.2  --  8.5  CREATININE 1.67* 1.51* 1.49*  --  1.81*   1.78*  --  2.03* 2.12* 2.11*  LATICACIDVEN 1.2  --   --   --   --   --   --   --   --   VANCOTROUGH  --   --   --   --   --  31*  --   --   --   VANCOPEAK  --   --   --  40  --   --    --   --   --   [name]  --   --   --   --   --   --  25  --  22     Estimated Creatinine Clearance: 27 mL/min (A) (by C-G formula based on SCr of 2.11 mg/dL (H)).    No Known Allergies  Antimicrobials this admission: Vancomycin 12/13 >> 12/ Cefepime 12/13 >> 12/18     (possible encephlopathy) Zosyn 12/18 >>12/20 Daptomycin   Dose adjustments this admission: See above  Microbiology results: 12/14 MRSA PCR: + 12/15 Bone cx: MRSA, E faecalis (amp-susc)  Thank you for allowing pharmacy to be a part of this patients care.  Doreene Eland, PharmD, BCPS, BCIDP Work Cell: [phone] 03/27/2021 8:19 AM

## 2021-03-28 DIAGNOSIS — R9401 Abnormal electroencephalogram [EEG]: Secondary | ICD-10-CM

## 2021-03-28 DIAGNOSIS — L97429 Non-pressure chronic ulcer of left heel and midfoot with unspecified severity: Secondary | ICD-10-CM

## 2021-03-28 LAB — CBC WITH DIFFERENTIAL/PLATELET
Abs Immature Granulocytes: 0.02 10*3/uL (ref 0.00–0.07)
Basophils Absolute: 0 10*3/uL (ref 0.0–0.1)
Basophils Relative: 0 %
Eosinophils Absolute: 0.2 10*3/uL (ref 0.0–0.5)
Eosinophils Relative: 3 %
HCT: 29.1 % — ABNORMAL LOW (ref 36.0–46.0)
Hemoglobin: 9.6 g/dL — ABNORMAL LOW (ref 12.0–15.0)
Immature Granulocytes: 0 %
Lymphocytes Relative: 31 %
Lymphs Abs: 2.3 10*3/uL (ref 0.7–4.0)
MCH: 30.2 pg (ref 26.0–34.0)
MCHC: 33 g/dL (ref 30.0–36.0)
MCV: 91.5 fL (ref 80.0–100.0)
Monocytes Absolute: 1 10*3/uL (ref 0.1–1.0)
Monocytes Relative: 13 %
Neutro Abs: 3.9 10*3/uL (ref 1.7–7.7)
Neutrophils Relative %: 53 %
Platelets: 200 10*3/uL (ref 150–400)
RBC: 3.18 MIL/uL — ABNORMAL LOW (ref 3.87–5.11)
RDW: 14 % (ref 11.5–15.5)
WBC: 7.5 10*3/uL (ref 4.0–10.5)
nRBC: 0 % (ref 0.0–0.2)

## 2021-03-28 LAB — COMPREHENSIVE METABOLIC PANEL
ALT: 7 U/L (ref 0–44)
AST: 15 U/L (ref 15–41)
Albumin: 2.4 g/dL — ABNORMAL LOW (ref 3.5–5.0)
Alkaline Phosphatase: 59 U/L (ref 38–126)
Anion gap: 6 (ref 5–15)
BUN: 27 mg/dL — ABNORMAL HIGH (ref 8–23)
CO2: 26 mmol/L (ref 22–32)
Calcium: 8.6 mg/dL — ABNORMAL LOW (ref 8.9–10.3)
Chloride: 108 mmol/L (ref 98–111)
Creatinine, Ser: 1.91 mg/dL — ABNORMAL HIGH (ref 0.44–1.00)
GFR, Estimated: 27 mL/min — ABNORMAL LOW (ref >60)
Glucose, Bld: 140 mg/dL — ABNORMAL HIGH (ref 70–99)
Potassium: 3 mmol/L — ABNORMAL LOW (ref 3.5–5.1)
Sodium: 140 mmol/L (ref 135–145)
Total Bilirubin: 0.9 mg/dL (ref 0.3–1.2)
Total Protein: 7.4 g/dL (ref 6.5–8.1)

## 2021-03-28 LAB — GLUCOSE, CAPILLARY
Glucose-Capillary: 126 mg/dL — ABNORMAL HIGH (ref 70–99)
Glucose-Capillary: 134 mg/dL — ABNORMAL HIGH (ref 70–99)
Glucose-Capillary: 148 mg/dL — ABNORMAL HIGH (ref 70–99)
Glucose-Capillary: 150 mg/dL — ABNORMAL HIGH (ref 70–99)
Glucose-Capillary: 151 mg/dL — ABNORMAL HIGH (ref 70–99)
Glucose-Capillary: 160 mg/dL — ABNORMAL HIGH (ref 70–99)
Glucose-Capillary: 67 mg/dL — ABNORMAL LOW (ref 70–99)

## 2021-03-28 LAB — PHOSPHORUS: Phosphorus: 2.8 mg/dL (ref 2.5–4.6)

## 2021-03-28 LAB — MAGNESIUM: Magnesium: 1.9 mg/dL (ref 1.7–2.4)

## 2021-03-28 LAB — VANCOMYCIN, RANDOM: Vancomycin Rm: 18

## 2021-03-28 MED ORDER — ADULT MULTIVITAMIN W/MINERALS CH
1.0000 | ORAL_TABLET | Freq: Every day | ORAL | Status: DC
  Administered 2021-03-28 – 2021-03-31 (×4): 1 via ORAL
  Filled 2021-03-28 (×4): qty 1

## 2021-03-28 MED ORDER — DEXTROSE 50 % IV SOLN
12.5000 g | INTRAVENOUS | Status: AC
  Administered 2021-03-28: 01:00:00 12.5 g via INTRAVENOUS
  Filled 2021-03-28: qty 50

## 2021-03-28 MED ORDER — ENSURE ENLIVE PO LIQD
237.0000 mL | Freq: Three times a day (TID) | ORAL | Status: DC
Start: 2021-03-28 — End: 2021-03-31
  Administered 2021-03-28 – 2021-03-31 (×9): 237 mL via ORAL

## 2021-03-28 MED ORDER — LEVOTHYROXINE SODIUM 50 MCG PO TABS
50.0000 ug | ORAL_TABLET | Freq: Every day | ORAL | Status: DC
Start: 2021-03-29 — End: 2021-03-31
  Administered 2021-03-29 – 2021-03-31 (×3): 50 ug via ORAL
  Filled 2021-03-28 (×3): qty 1

## 2021-03-28 MED ORDER — DEXTROSE IN LACTATED RINGERS 5 % IV SOLN: INTRAVENOUS | Status: DC

## 2021-03-28 MED ORDER — SODIUM CHLORIDE 0.9 % IV SOLN
8.0000 mg/kg | INTRAVENOUS | Status: DC
  Administered 2021-03-28: 21:00:00 600 mg via INTRAVENOUS
  Filled 2021-03-28: qty 12

## 2021-03-28 MED ORDER — POTASSIUM CHLORIDE CRYS ER 20 MEQ PO TBCR
40.0000 meq | EXTENDED_RELEASE_TABLET | Freq: Every day | ORAL | Status: DC
  Administered 2021-03-28: 15:00:00 40 meq via ORAL
  Filled 2021-03-28: qty 2

## 2021-03-28 NOTE — Progress Notes (Addendum)
PROGRESS NOTE   [name] [name]  BDZ:[id number] DOB: 1945-07-21 DOA: 03/20/2021 PCP: Marguerita Merles, MD  Brief Narrative:  75 year old African-American female home dwelling typically nonambulatory Charcot arthropathy followed by podiatry  prior right second toe infection status post revascularization 05/2020 and amputation discharged on Augmentin HTN CKD 4 DM TY 2 (pancreatitis while on Victoza) HLD Hypothyroid Prior episodic encephalopathy during 11/2019 admission underlying mild dementia reported  Developed pressure ulcer left heel after rehab stay-Home health nurse found significant infection beneath the scab and patient brought to ED 12/13 Found to have decubitus ulcer to the left heel  Cardiology consulted and patient cleared without restriction for surgery 12/14 vascular evaluated- status post percutaneous angioplasty left peroneal Dr. 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 12/18-not engaging in conversation and neurology consulted for altered mental status possible seizures  Infectious disease consulted as patient unwilling to perform left AKA--cefepime changed to daptomycin   Hospital-Problem based course  Toxic metabolic encephalopathy since 12/17--possibly related to cefepime MRI nonfocal, B12 low normal and given 1 dose of IM B12 TSH normal, methylmalonic acid 12/19 is still pending Appreciate neurology, infectious disease input EEG repeat 12/20 no seizure activity--triphasic Per neurology should continue Keppra 500 every 12 Watch for aspiration Osteomyelitis and diabetic foot ulcer Angiogram 12/14 and D12/15 MRSA and E faecalis on bone biopsy 12/15 Patient resistant to amputation although has been recommended to her Will need long-term antibiotics-currently is daptomycin-ID may consider Doxy/Amoxil on discharge DM TY 2 with complication of hypoglycemia earlier this  admission CBGs ranging 1 30-1 50, continue only very sensitive sliding scale CKD 4 due to diabetes Hypokalemia Replace potassium with K. Dur 40 Repeat labs in a.m. Continue D5 at 75 cc an hour Hypothyroid Continue Synthroid change over to p.o. 50 mcg follow TSH for work-up of above Hypertension Continue amlodipine 10 ?  Dementia Continue Aricept 10 at bedtime Urinary retention Foley placed this admission Will need voiding trial probably closer to discharge needs to be more awake alert to be able to since Foley in place  DVT prophylaxis: SCD Code Status: Full Family Communication: None at the bedside Disposition:  Status is: Inpatient       Consultants:  Infectious disease Vascular Orthopedics  Procedures: As above  Antimicrobials: Daptomycin   Subjective: Sleepy but arousable she is able to tell me where she is and that she is in the hospital Main complaint is some back pain Her wounds have been wrapped and dressed this morning so they were not examined this pm She does not appear to be overtly short of breath but does become seem sleepy  Objective: Vitals:   03/27/21 2010 03/28/21 0559 03/28/21 0813 03/28/21 1135  BP: 106/67 (!) 154/57 (!) 154/50 139/61  Pulse: 65 60 66 67  Resp: 16 16 15 18   Temp: 97.6 F (36.4 C) 98.3 F (36.8 C) 98.1 F (36.7 C) 98.3 F (36.8 C)  TempSrc:      SpO2: 98% 96% 100% 95%  Weight:      Height:        Intake/Output Summary (Last 24 hours) at 03/28/2021 1413 Last data filed at 03/28/2021 0700 Gross per 24 hour  Intake 930.02 ml  Output 1100 ml  Net -169.98 ml   Filed Weights   03/20/21 1446  Weight: 90.7 kg    Examination:  Mild sleepy arousable No icterus no pallor Neck soft supple Moderate dentition S1-S2 no murmur Abdomen soft  Foley in place ROM intact follows commands intermittently but drops hands right back to the bed after raising the amount Power 5/5 Reflexes deferred  Data Reviewed: personally  reviewed   CBC    Component Value Date/Time   WBC 7.5 03/28/2021 0847   RBC 3.18 (L) 03/28/2021 0847   HGB 9.6 (L) 03/28/2021 0847   HGB 10.7 (L) 04/17/2014 1131   HCT 29.1 (L) 03/28/2021 0847   HCT 32.9 (L) 04/17/2014 1131   PLT 200 03/28/2021 0847   PLT 226 04/17/2014 1131   MCV 91.5 03/28/2021 0847   MCV 91 04/17/2014 1131   MCH 30.2 03/28/2021 0847   MCHC 33.0 03/28/2021 0847   RDW 14.0 03/28/2021 0847   RDW 13.8 04/17/2014 1131   LYMPHSABS 2.3 03/28/2021 0847   LYMPHSABS 2.4 04/21/2013 0108   MONOABS 1.0 03/28/2021 0847   MONOABS 0.8 04/21/2013 0108   EOSABS 0.2 03/28/2021 0847   EOSABS 0.1 04/21/2013 0108   BASOSABS 0.0 03/28/2021 0847   BASOSABS 0.1 04/21/2013 0108   CMP Latest Ref Rng & Units 03/28/2021 03/27/2021 04/07/21  Glucose 70 - 99 mg/dL 140(H) 85 135(H)  BUN 8 - 23 mg/dL 27(H) 34(H) 36(H)  Creatinine 0.44 - 1.00 mg/dL 1.91(H) 2.11(H) 2.12(H)  Sodium 135 - 145 mmol/L 140 144 140  Potassium 3.5 - 5.1 mmol/L 3.0(L) 3.5 3.6  Chloride 98 - 111 mmol/L 108 111 113(H)  CO2 22 - 32 mmol/L 26 22 17(L)  Calcium 8.9 - 10.3 mg/dL 8.6(L) 9.1 9.0  Total Protein 6.5 - 8.1 g/dL 7.4 7.5 -  Total Bilirubin 0.3 - 1.2 mg/dL 0.9 1.0 -  Alkaline Phos 38 - 126 U/L 59 60 -  AST 15 - 41 U/L 15 14(L) -  ALT 0 - 44 U/L 7 6 -     Radiology Studies: EEG adult  Result Date: 07-Apr-2021 Derek Jack, MD     April 07, 2021  9:06 PM Routine EEG Report [name] [name] is a 75 y.o. female with a history of encephalopathy who is undergoing an EEG to evaluate for seizures. Report: This EEG was acquired with electrodes placed according to the International 10-20 electrode system (including Fp1, Fp2, F3, F4, C3, C4, P3, P4, O1, O2, T3, T4, T5, T6, A1, A2, Fz, Cz, Pz). The following electrodes were missing or displaced: none. The best background was 4-5 Hz. This activity is reactive to stimulation. No sleep architecture was identified. There was no focal slowing. There are continuous  triphasic waves throughout the recording, initially at 1 Hz and later in the recording 2-2.5 Hz. The discharges are highly rhythmic and some runs appear to have subtle evolution and be followed by a 1-2 second period of diffuse relative suppression. Photic stimulation and hyperventilation were not performed. Impression and clinical correlation: This EEG was obtained while obtunded and is abnormal due to: - Moderate diffuse slowing indicative of global cerebral dysfunction - Continuous triphasic waves, at times up to 2-2.5 Hz, at times in runs displaying subtle evolution and followed by a 1-2 second period of relative suppression. This pattern falls on the ictal-interictal continuum and is concerning for nonconvulsive seizure activity. Su Monks, MD [facility] [phone] If 7pm- 7am, please page neurology on call as listed in [place].     Scheduled Meds:  amLODipine  10 mg Oral Daily   aspirin EC  81 mg Oral Daily   Or   aspirin  300 mg Rectal Daily   atorvastatin  40 mg Oral Daily   Chlorhexidine  Gluconate Cloth  6 each Topical Daily   donepezil  10 mg Oral QHS   feeding supplement  237 mL Oral TID BM   insulin aspart  0-6 Units Subcutaneous Q4H   levothyroxine  25 mcg Intravenous Daily   multivitamin with minerals  1 tablet Oral Daily   polyethylene glycol  17 g Oral Daily   vancomycin variable dose per unstable renal function (pharmacist dosing)   Does not apply See admin instructions   Continuous Infusions:  dextrose 5% lactated ringers 75 mL/hr at 03/28/21 0146   levETIRAcetam 500 mg (03/28/21 0617)     LOS: 7 days   Time spent: Crystal Lawns, MD [facility] To contact the attending provider between 7A-7P or the covering provider during after hours 7P-7A, please log into the web site [email or web address] and access using universal [place] password for that web site. If you do not have the password, please call the hospital operator.  03/28/2021, 2:13  PM

## 2021-03-28 NOTE — Progress Notes (Signed)
S: Patient alert and shows no abnormal movements today. Oriented to self and [place] [facility]. Follows commands.  EEG 03/26/21 interpreted by me shows continuous triphasics, initially at 1 Hz, highly rhythmic, with episodic increases in frequency to 2-2.5 Hz with possible subtle evolution followed by brief periods of relative suppression c/f possible nonconvulsive seizure activity.  Repeat EEG 03/27/21 shows frequent triphasics, at times continuous up to 1Hz , no seizure activity. Much improved.  MRI brain wo contrast 03/25/21 NAICP (personal review)  O:  Vitals:   03/28/21 0813 03/28/21 1135  BP: (!) 154/50 139/61  Pulse: 66 67  Resp: 15 18  Temp: 98.1 F (36.7 C) 98.3 F (36.8 C)  SpO2: 100% 95%   Gen: alert, NAD CV: RRR Resp: CTAB  Neurologic exam MS: awake, alert able to tell me her full name today and that she is at [facility]. Follows simple commands Speech: mild dysarthria, unable to name and repeat CN: PERRL, EOMI, corneals intact, face symmetric at rest, hearing intact to voice Motor: No abnormal movements on exam today. Anti-gravity BUE, BLE not examined 2/2 wounds Sensory: SILT DTRs: 3+ and symmetric BUE, areflexic bilat patellae, plantars deferred 2/2 wound Cerebellar, gait: UTA  Assessment:  75 y.o. female with a PMHx of chronic diastolic HF, CKD4, Charcot foot, anemia, DM, HLD, HTN, hypothyroidism and prior toe amputation who presented to the hospital on 12/13 with a several-week history of worsening diabetic left heel ulcer that progressed to the point that it was draining purulent material. She was admitted for debridement and possible endovascular intervention. She was started on IV antibiotics and Podiatry was consulted in addition to Vascular Surgery. 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 BKA was then recommended, but patient has so far not been receptive to this option. Neurology consulted on  03/25/21 2/2 abnormal movements and patient not interacting with providers. Exam 03/25/21 revealed myoclonic-like changes in tone to BUE triggered consistently by passive movement. Today she has subtle low amplitude low frequency writhing movements of her upper trunk and BUE which appear somewhat choreoathetotic. No myoclonus on my exam. MRI brain wo contrast NAICP. Cefepime d/c'd 12/18. No neck stiffness or fever to suggest a meningitis. EEG 03/26/21 interpreted by me shows continuous triphasics, initially at 1 Hz, highly rhythmic, with episodic increases in frequency to 2-2.5 Hz with possible subtle evolution followed by brief periods of relative suppression c/f possible nonconvulsive seizure activity. Repeat EEG 03/27/21 shows frequent triphasics, at times continuous up to 1Hz , no seizure activity. Much improved.  Suspect her encephalopathy and abnl EEG pattern may have been 2/2 cefepime, both have markedly improved since abx was changed. It is possible she was having some nonconvulsive seizure activity in the setting of infection, and given her ongoing acute illness I would recommend continuing keppra until she follows up with neurology outpatient, and which time it can likely be weaned     Recommendations: - Cont keppra 500mg  IV q 12 hrs (renal dosing) until outpatient f/u with neurology - Place ambulatory referral to neurology at hospital discharge  No further inpatient neurologic workup indicated at this time. Neurology will not continue to actively follow, but please re-engage if additional questions arise.  Su Monks, MD [facility] [phone]  If 7pm- 7am, please page neurology on call as listed in [place].

## 2021-03-28 NOTE — Evaluation (Addendum)
Clinical/Bedside Swallow Evaluation Patient Details  Name: [name] [name] MRN: [id number] Date of Birth: 04/10/1945  Today's Date: 03/28/2021 Time: SLP Start Time (ACUTE ONLY): 7510 SLP Stop Time (ACUTE ONLY): 2585 SLP Time Calculation (min) (ACUTE ONLY): 60 min  Past Medical History:  Past Medical History:  Diagnosis Date   Chronic diastolic CHF (congestive heart failure), NYHA class 3 ([place]) 06/30/2019   Diabetes mellitus without complication ([place])    Humerus fracture 04/20/2018   left   Hyperlipidemia    Hypertension    Hypothyroidism    Vertigo    several yrs ago   Wears dentures    partial upper and lower (loose)   Past Surgical History:  Past Surgical History:  Procedure Laterality Date   AMPUTATION TOE Right 05/31/2020   Procedure: AMPUTATION TOE;  Surgeon: Caroline More, DPM;  Location: [facility];  Service: Podiatry;  Laterality: Right;   CATARACT EXTRACTION W/PHACO Left 12/01/2018   Procedure: CATARACT EXTRACTION PHACO AND INTRAOCULAR LENS PLACEMENT ([place]) LEFT DIABETIC;  Surgeon: Birder Robson, MD;  Location: [place];  Service: Ophthalmology;  Laterality: Left;  Diabetic - insulin   COLONOSCOPY WITH PROPOFOL N/A 07/07/2019   Procedure: COLONOSCOPY WITH PROPOFOL;  Surgeon: Robert Bellow, MD;  Location: [facility];  Service: Endoscopy;  Laterality: N/A;   ESOPHAGOGASTRODUODENOSCOPY (EGD) WITH PROPOFOL N/A 07/07/2019   Procedure: ESOPHAGOGASTRODUODENOSCOPY (EGD) WITH PROPOFOL;  Surgeon: Robert Bellow, MD;  Location: [facility];  Service: Endoscopy;  Laterality: N/A;   FOOT SURGERY Right    lesion excision   IRRIGATION AND DEBRIDEMENT FOOT Left 03/22/2021   Procedure: IRRIGATION AND DEBRIDEMENT FOOT AND BONE BIOPSY;  Surgeon: Felipa Furnace, DPM;  Location: [facility];  Service: Podiatry;  Laterality: Left;   LOWER EXTREMITY ANGIOGRAPHY Right 05/19/2020   Procedure: Lower Extremity Angiography;  Surgeon: Katha Cabal, MD;  Location: [place] [facility];  Service: Cardiovascular;  Laterality: Right;   LOWER EXTREMITY ANGIOGRAPHY Right 05/30/2020   Procedure: Lower Extremity Angiography;  Surgeon: Katha Cabal, MD;  Location: [place] [facility];  Service: Cardiovascular;  Laterality: Right;  Pedal Approach   LOWER EXTREMITY ANGIOGRAPHY Left 03/21/2021   Procedure: Lower Extremity Angiography;  Surgeon: Algernon Huxley, MD;  Location: [place] [facility];  Service: Cardiovascular;  Laterality: Left;   HPI:  Pt  is a 75 y.o. female with a PMH significant for Type II DM, HTN, CKD 4, hypothyroidism, H LD, GERD, Charcot foot - chronic diabetic food issues, anemia.  They presented from home where she lives with her son to the ED on 03/20/2021 with left heel ulcer x several weeks.  At baseline, patient does not walk or perform any of her own ADLs.  They have a nursing assistant to address the home to provide her care and was the one who noticed her foot wound today was draining purulent material.   She was admitted for debridement and possible endovascular intervention. She was started on IV antibiotics and Podiatry was consulted in addition to Vascular Surgery. 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 BKA was then recommended, but patient has so far not been receptive to this option.  Neurology following for possible seizure; encephalopathy.  MRI: No acute intracranial process; atrophy with moderate periventricular small vessel disease.. Lungs clear per MD.    Assessment / Plan / Recommendation  Clinical Impression  Pt appears to present w/ Mild+ oropharyngeal phase dysphagia in light of  declined Cognitive presentation; unsure of pt's Baseline Cognitive status in general. Any decline in Cognitiion can impact overall awareness/timing of swallow and safety during po tasks which increases risk for aspiration, choking. Pt's risk for aspiration is present but can be  reduced when following general aspiration precautions and using a modified diet consistency to support the oral phase. Of note, pt had been recommended a mech soft diet consistency when seen by ST services in 2021.     During this evaluation, she required min-mod verbal/tactile/visual cues for follow through during po tasks. Noted pt mumbling to herself post session for several minutes(?). Poor orientation and awareness re: her foot condition/illness when asked questiosn re: it. W/ po trials, pt consumed several trials of ice chips, purees, soft solids and thin liquids via Cup/Straw w/ No immediate, overt clinical s/s of aspiration noted; no decline in vocal quality; no cough, and no decline in respiratory status during/post trials. Of note, a delayed cough was noted post multiple sips of thin liquids x1 -- all further trials were limited to 1-2 sips. Oral phase was adequate for bolus management and oral clearing of the liquid and puree boluses. Mastication of softened solids was lengthy and disorganized w/ increased Time needed for management and eventual clearing. Pt was given verbal/tactile cues to use lingual sweeping to aid oral clearing. Pt attempted self-feeding but required Mod-Max support d/t UE weakness and poor grip on Cup; she also needed guidance d/t confusion w/ tasks. OM Exam appeared grossly [facility] w/ No unilateral weakness noted; lingual protrusion for symmetry and strength WFL.Marland Kitchen Confusion of OM tasks and oral care noted - apraxic-like.         D/t pt's presentation of declined Cognitive status, and her risk for aspiration, recommend initiation of the dysphagia level 2(minced foods moistened) w/ thin liquids via Cup if any coughing noted w/ straw use; aspiration precautions; reduce Distractions during meals and engage pt during po's at meal for self-feeding as able. Pills Crushed in Puree for safer swallowing. Support w/ feeding and Supervision at all meals. MD/NSG updated. ST services recommends  follow w/ Palliative Care for [facility] and education re: impact of any Cognitive decline on swallowing. ST services will continue to monitor pt's status for toleration of diet.  SLP Visit Diagnosis: Dysphagia, oropharyngeal phase (R13.12) (cognitive decline is presenting)    Aspiration Risk  Mild aspiration risk;Risk for inadequate nutrition/hydration    Diet Recommendation   dysphagia level 2(minced foods moistened) w/ thin liquids via Cup if any coughing noted w/ straw use; aspiration precautions; reduce Distractions during meals and engage pt during po's at meal for self-feeding as able. Support w/ feeding, check for oral clearing, and Supervision at all meals.   Medication Administration: Crushed with puree    Other  Recommendations Recommended Consults:  (Dietician f/u; Pallliative Care f/u for [place], education) Oral Care Recommendations: Oral care BID;Oral care before and after PO;Staff/trained caregiver to provide oral care Other Recommendations:  (n/a currently)    Recommendations for follow up therapy are one component of a multi-disciplinary discharge planning process, led by the attending physician.  Recommendations may be updated based on patient status, additional functional criteria and insurance authorization.  Follow up Recommendations Skilled nursing-short term rehab (<3 hours/day)      Assistance Recommended at Discharge Frequent or constant Supervision/Assistance  Functional Status Assessment Patient has had a recent decline in their functional status and/or demonstrates limited ability to make significant improvements in function in a reasonable and predictable amount of time  Frequency and Duration min 2x/week  2 weeks       Prognosis Prognosis for Safe Diet Advancement: Fair Barriers to Reach Goals: Cognitive deficits;Language deficits;Severity of deficits;Time post onset Barriers/Prognosis Comment: unsure of Cognitive baseline      Swallow Study   General Date of  Onset: 03/20/21 HPI: Pt  is a 75 y.o. female with a PMH significant for Type II DM, HTN, CKD 4, hypothyroidism, H LD, GERD, Charcot foot - chronic diabetic food issues, anemia.  They presented from home where she lives with her son to the ED on 03/20/2021 with left heel ulcer x several weeks.  At baseline, patient does not walk or perform any of her own ADLs.  They have a nursing assistant to address the home to provide her care and was the one who noticed her foot wound today was draining purulent material.   She was admitted for debridement and possible endovascular intervention. She was started on IV antibiotics and Podiatry was consulted in addition to Vascular Surgery. 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 BKA was then recommended, but patient has so far not been receptive to this option.  Neurology following for possible seizure; encephalopathy.  MRI: No acute intracranial process; atrophy with moderate periventricular small vessel disease.. Lungs clear per MD. Type of Study: Bedside Swallow Evaluation Previous Swallow Assessment: 10/2019 Diet Prior to this Study: Dysphagia 3 (soft);Thin liquids Temperature Spikes Noted: No (wbc 7.5) Respiratory Status: Room air History of Recent Intubation: No Behavior/Cognition: Alert;Cooperative;Pleasant mood;Confused;Distractible;Requires cueing (unsure of pt's Baseline Cognitive status -- decline Baseline?) Oral Cavity Assessment: Dry Oral Care Completed by SLP: Yes Oral Cavity - Dentition: Missing dentition (not wearing partials) Vision: Functional for self-feeding (appeared?) Self-Feeding Abilities: Needs assist;Needs set up;Total assist (difficulty holding/gripping cup to drink) Patient Positioning: Upright in bed (needed positioning support) Baseline Vocal Quality: Normal;Low vocal intensity (mumbled speech) Volitional Cough: Strong Volitional Swallow: Able to elicit     Oral/Motor/Sensory Function Overall Oral Motor/Sensory Function: Generalized oral weakness (no unilateral weakness noted; lingual smacking/chewing)   Ice Chips Ice chips: Within functional limits Presentation: Spoon (fed; 3 trials)   Thin Liquid Thin Liquid: Within functional limits (grossly) Presentation: Cup;Straw (had to fully support cup holding; 10 trials) Other Comments: mild cough noted x1 post multiple sips    Nectar Thick Nectar Thick Liquid: Not tested   Honey Thick Honey Thick Liquid: Not tested   Puree Puree: Within functional limits Presentation: Spoon (fed; 10 trials)   Solid     Solid: Impaired Presentation: Spoon (fed; 6 trials of moistened/choppled cracker) Oral Phase Impairments: Reduced lingual movement/coordination;Impaired mastication;Poor awareness of bolus Oral Phase Functional Implications: Prolonged oral transit;Impaired mastication;Oral residue Pharyngeal Phase Impairments:  (none) Other Comments: lingual sweep to clear        [name] Kenner, MS, [place] Speech Language Pathologist Rehab Services [phone] [name] [name] 03/28/2021,11:35 AM

## 2021-03-28 NOTE — Progress Notes (Signed)
Pharmacy Antibiotic Note  [name] [name] is a 75 y.o. female admitted on 03/20/2021 with  DM foot infection/Osteomyelitis .  Pharmacy has been consulted for daptomycin and Zosyn dosing.  Patient is s/p irrigation and debridement and bone biopsy of left foot wound on 12/15. Has OM in the calcaneus and medullary canal. Vascular consulted for BKA but patient unwilling to proceed at this point.   Scr with slight bump today. Hyperchloremic NAGMA noted on BMP. Likely secondary to normal saline versus worsening renal function from vancomycin, ID consulted and based on renal function, will change vancomycin to daptomycin when vancomycin levels appropriate.    Steady State PK 12/17&12/18: Vpk: 40 (drawn ~1 hr after completion of infusion) Vtr: 31 (drawn ~ 3 hr early) AUC: 834, Cmax 40.7, Cmin 29.4, t1/2 49h  03/28/2021 CK = 35 12/21 Renal: SCR 1.91 - slightly improved Random vancomycin level = 41mcg/mL (last vancomycin 750mg  q24h dose was given 12/17 at 14:27. Excreting vancomycin very slowly  Plan: - Daptomycin 600mg  (8mg /kg per adjusted BW) q48h as CrCl currently <= 58ml/min  Check CK weekly patient on atorvastatin 40mg  daily for hyperlipidemia.  Doubt need to adjust as current plan is to stop daptomycin at discharge Stopped piperacillin/tazobactam Plan oral antibiotics at discharge   Height: 5\' 7"  (170.2 cm) Weight: 90.7 kg (200 lb) IBW/kg (Calculated) : 61.6  Temp (24hrs), Avg:98.1 F (36.7 C), Min:97.6 F (36.4 C), Max:98.3 F (36.8 C)  Recent Labs  Lab 03/24/21 1647 03/25/21 0416 03/25/21 1050 03/26/21 0411 03/26/21 0411 03/26/21 1835 03/27/21 0500 03/28/21 0847  WBC  --  9.0  --  9.2  --   --  8.5 7.5  CREATININE  --  1.81*   1.78*  --  2.03*  --  2.12* 2.11* 1.91*  VANCOTROUGH  --   --  31*  --   --   --   --   --   VANCOPEAK 40  --   --   --   --   --   --   --   VANCORANDOM  --   --   --  25   < >  --  22 18   < > = values in this interval not displayed.      Estimated Creatinine Clearance: 29.9 mL/min (A) (by C-G formula based on SCr of 1.91 mg/dL (H)).    No Known Allergies  Antimicrobials this admission: Vancomycin 12/13 >> 12/ Cefepime 12/13 >> 12/18     (possible encephlopathy) Zosyn 12/18 >>12/20 Daptomycin  12/21 >>  Dose adjustments this admission: See above  Microbiology results: 12/14 MRSA PCR: + 12/15 Bone cx: MRSA, E faecalis (amp-susc)  Thank you for allowing pharmacy to be a part of this patients care.  Doreene Eland, PharmD, BCPS, BCIDP Work Cell: [phone] 03/28/2021 4:08 PM

## 2021-03-28 NOTE — Progress Notes (Addendum)
Hypoglycemic Event  CBG: 67  Treatment: D50 % solution dose 12.5 g IV STAT  Symptoms: None  Follow-up CBG: Time:0135 CBG Result:148  Possible Reasons for Event: Inadequate meal intake  Comments/MD notified: MD Hal Hope made aware. Will continue to monitor.  Update 0214: MD Hal Hope ordered D5 % lactated ringer infusion continous. Will continue to monitor.    [name] [name]

## 2021-03-28 NOTE — Progress Notes (Signed)
Initial Nutrition Assessment  DOCUMENTATION CODES:  Non-severe (moderate) malnutrition in context of chronic illness  INTERVENTION:  Add Ensure Enlive po TID, each supplement provides 350 kcal and 20 grams of protein.  Add MVI with minerals daily.  Recommend strengthening bowel regimen due to no BM since PTA.  Encourage PO and supplement intake.  NUTRITION DIAGNOSIS:  Moderate Malnutrition related to chronic illness (osteomyelitis of L heel) as evidenced by mild fat depletion, mild muscle depletion, moderate muscle depletion.  GOAL:  Patient will meet greater than or equal to 90% of their needs  MONITOR:  PO intake, Supplement acceptance, Labs, Weight trends, Skin, I & O's, Diet advancement  REASON FOR ASSESSMENT:  Low Braden    ASSESSMENT:  75 yo female with a PMH of type 2 diabetes, hypertension, CKD stage IV, hypothyroidism, Charcot foot and peripheral arterial disease.  Presenting to the emergency room with left heel ulcer for several weeks.  Patient is mostly bedbound.  She uses wheelchair to get around.  Followed by podiatry as outpatient.  Admitted for surgical intervention.  She was diagnosed with osteomyelitis of the left calcaneus, seen by vascular and underwent angiogram and stenting.  Podiatry performed irrigation and debridement of foot and bone biopsy.  Amputation was recommended, but patient reportedly not interested.  She developed encephalopathy and neurology was consulted.  Mild improvement noted from 12/19 to 12/20.  Spoke with pt and aid from facility at bedside. Pt still a bit confused and mumbled at times when RD was asking questions.  Pt reported not feeling too hungry lately, but could not tell me why, but she has been able to eat pretty well for breakfast.  Aid reports patient very willing to take Ensure. Aid reports helping pt eat at bedside by assisting with feeding.  Pt with poor oral intake and would benefit from nutrient dense supplement. One Ensure  Enlive supplement provides 350 kcals, 20 grams protein, and 44-45 grams of carbohydrate vs one Glucerna shake supplement, which provides 220 kcals, 10 grams of protein, and 26 grams of carbohydrate. Given pt's hx of DM, RD will reassess adequacy of PO intake, CBGS, and adjust supplement regimen as appropriate at follow-up.    Pt with very limited PO documentation.   Pt reports weight loss, and aid endorses this, but they are both unsure of the amount and over the length of time.  Per Epic, pt has lost ~15 lbs (6.8%) in the last 10 months, which is not necessarily significant for the time frame.   Legs more depleted due to pt being mostly wheelchair/bedbound.  Pt also due for new weight. RD to order.  Of note, pt with mild BLE edema.  Also of note, pt has not had a bowel movement since before admission. Pt already on miralax. RD messaged MD via secure chat to recommend strengthening bowel regimen.  Medications: reviewed; SSI, Synthroid, miralax, D5 in LR @ 75 ml/hr   Labs: reviewed; K 3 (L), CBG 67-151 HbA1c: 6.7% (03/20/2021)  NUTRITION - FOCUSED PHYSICAL EXAM: Flowsheet Row Most Recent Value  Orbital Region Mild depletion  Upper Arm Region No depletion  Thoracic and Lumbar Region No depletion  Buccal Region Mild depletion  Temple Region Moderate depletion  Clavicle Bone Region Mild depletion  Clavicle and Acromion Bone Region Mild depletion  Scapular Bone Region Unable to assess  Dorsal Hand Mild depletion  Patellar Region Severe depletion  Anterior Thigh Region Severe depletion  Posterior Calf Region Severe depletion  Edema (RD Assessment) None  Hair  Reviewed  Eyes Reviewed  Mouth Reviewed  Skin Reviewed  Nails Reviewed   Diet Order:   Diet Order             DIET DYS 2 Room service appropriate? Yes with Assist; Fluid consistency: Thin  Diet effective now                  EDUCATION NEEDS:  Not appropriate for education at this time  Skin:  Skin Assessment:  Skin Integrity Issues: Skin Integrity Issues:: Incisions Incisions: L foot (12/15), closed  Last BM:  unknown  Height:  Ht Readings from Last 1 Encounters:  03/20/21 5\' 7"  (1.702 m)   Weight:  Wt Readings from Last 1 Encounters:  03/20/21 90.7 kg   BMI:  Body mass index is 31.32 kg/m.  Estimated Nutritional Needs:  Kcal:  0233-4356 Protein:  110-125 grams Fluid:  >1.75 L  Derrel Nip, RD, LDN (she/her/hers) Clinical Inpatient Dietitian RD Pager/After-Hours/Weekend Pager # in [place]

## 2021-03-28 NOTE — TOC Progression Note (Signed)
Transition of Care [facility]) - Progression Note    Patient Details  Name: [name] [name] MRN: [id number] Date of Birth: 03-18-1946  [facility] CM/SW [place], RN Phone Number: 03/28/2021, 10:26 AM  Clinical Narrative:    TOC continues to follow patient for DC planning and needs, More Consults pending, Vascular and Podiatry, Doe snot appear that the will need to transfer for treatment , TOC continues to Monitor for plan and needs        Expected Discharge Plan and Services                                                 Social Determinants of Health (SDOH) Interventions    Readmission Risk Interventions Readmission Risk Prevention Plan 06/01/2020 05/18/2020 11/19/2019  Transportation Screening Complete Complete Complete  PCP or Specialist Appt within 3-5 Days - Complete Complete  HRI or [place] - Complete Complete  Social Work Consult for [place] Planning/Counseling - Complete Complete  Palliative Care Screening - Not Applicable Complete  Medication Review Press photographer) Complete Complete Complete  PCP or Specialist appointment within 3-5 days of discharge Complete - -  SW Recovery Care/Counseling Consult Complete - -  [place] Not Applicable - -  Some recent data might be hidden

## 2021-03-28 NOTE — Progress Notes (Signed)
Date of Admission:  03/20/2021      ID: [name] [name] is a 75 y.o. 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)   Diabetic foot ulcer (HCC)   Acute metabolic encephalopathy   Acute urinary retention   Osteomyelitis (HCC)   Atrial tachycardia (HCC)   Abnormal EEG    Subjective: A little more alert today  Medications:   amLODipine  10 mg Oral Daily   aspirin EC  81 mg Oral Daily   Or   aspirin  300 mg Rectal Daily   atorvastatin  40 mg Oral Daily   Chlorhexidine Gluconate Cloth  6 each Topical Daily   donepezil  10 mg Oral QHS   insulin aspart  0-6 Units Subcutaneous Q4H   levothyroxine  25 mcg Intravenous Daily   polyethylene glycol  17 g Oral Daily   vancomycin variable dose per unstable renal function (pharmacist dosing)   Does not apply See admin instructions    Objective: Vital signs in last 24 hours: Temp:  [97.6 F (36.4 C)-98.3 F (36.8 C)] 98.3 F (36.8 C) (12/21 1135) Pulse Rate:  [60-75] 67 (12/21 1135) Resp:  [15-18] 18 (12/21 1135) BP: (106-154)/(50-67) 139/61 (12/21 1135) SpO2:  [95 %-100 %] 95 % (12/21 1135)  PHYSICAL EXAM:  General: more awake , answering simple questions Head: Normocephalic, without obvious abnormality, atraumatic. Eyes: Conjunctivae clear, anicteric sclerae. Pupils are equal Lungs: b/l air entry Heart: Regular rate and rhythm, no murmur, rub or gallop. Abdomen: Soft, non-tender,not distended. Bowel sounds normal. No masses Extremities: left heel ulcer Skin: No rashes or lesions. Or bruising Lymph: Cervical, supraclavicular normal. Neurologic: Grossly non-focal  Lab Results Recent Labs    03/27/21 0500 03/28/21 0847  WBC 8.5 7.5  HGB 9.5* 9.6*  HCT 29.4* 29.1*  NA 144 140  K  3.5 3.0*  CL 111 108  CO2 22 26  BUN 34* 27*  CREATININE 2.11* 1.91*   Liver Panel Recent Labs    03/27/21 0500 03/28/21 0847  PROT 7.5 7.4  ALBUMIN 2.6* 2.4*  AST 14* 15  ALT 6 7  ALKPHOS 60 59  BILITOT 1.0 0.9   Sedimentation Rate No results for input(s): ESRSEDRATE in the last 72 hours. C-Reactive Protein No results for input(s): CRP in the last 72 hours.  Microbiology:  Studies/Results: EEG adult  Result Date: 03/26/2021 [name] Jack, MD     03/26/2021  9:06 PM Routine EEG Report [name] [name] is a 75 y.o. female with a history of encephalopathy who is undergoing an EEG to evaluate for seizures. Report: This EEG was acquired with electrodes placed according to the International 10-20 electrode system (including Fp1, Fp2, F3, F4, C3, C4, P3, P4, O1, O2, T3, T4, T5, T6, A1, A2, Fz, Cz, Pz). The following electrodes were missing or displaced: none. The best background was 4-5 Hz. This activity is reactive to stimulation. No sleep architecture was identified. There was no focal slowing. There are continuous triphasic waves throughout the recording, initially at 1 Hz and later in the recording 2-2.5 Hz. The discharges are highly rhythmic and some runs appear to have subtle evolution and be followed by a 1-2 second period of diffuse relative suppression. Photic stimulation and hyperventilation were  not performed. Impression and clinical correlation: This EEG was obtained while obtunded and is abnormal due to: - Moderate diffuse slowing indicative of global cerebral dysfunction - Continuous triphasic waves, at times up to 2-2.5 Hz, at times in runs displaying subtle evolution and followed by a 1-2 second period of relative suppression. This pattern falls on the ictal-interictal continuum and is concerning for nonconvulsive seizure activity. [name] Monks, MD [facility] [phone] If 7pm- 7am, please page neurology on call as listed in [place].     Assessment/Plan: Left  heel decubitus wound status postdebridement.  Bone biopsy shows acute on chronic osteomyelitis. Bone culture has MRSA and Enterococcus faecalis Patient refused BKA. Currently on daptomycin because of  AKI on CKD. Because of patient's immobility and poor medical condition IV antibiotic alone is not going to heal the ulcer. Discussed with son- may do PO antibiotic on discharge  Doxy/amoxicillin   Encephalopathy.  Concern for cefepime CNS side effect in the setting of CKD.  This has been discontinued on 03/25/2021. EEG showed changes suggestive of cerebral dysfunction as well as nonconvulsive seizures state Some improvement MRI showed brain parenchymal loss secondary to age as well as old lacunar infarcts.  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     AKI on CKD  Anemia  Hypothyroidism.  on synthroid- TSH Okay  Discussed the management with care team.

## 2021-03-28 NOTE — Progress Notes (Signed)
S: Patient alert but nonverbal on my examination. Attends examiner and appears as if she is attempting to speak but cannot. Low frequency continuous movement of her upper trunk and shoulders, appears mildly choreoathetotic. Cefepime d/c'd.   EEG 03/26/21 interpreted by me shows continuous triphasics, initially at 1 Hz, highly rhythmic, with episodic increases in frequency to 2-2.5 Hz with possible subtle evolution followed by brief periods of relative suppression c/f possible nonconvulsive seizure activity.  Repeat EEG today shows frequent triphasics, at times continuous up to 1Hz , no seizure activity. Much improved.  MRI brain wo contrast 03/25/21 NAICP (personal review)  O:  Vitals:   03/27/21 2010 03/28/21 0559  BP: 106/67 (!) 154/57  Pulse: 65 60  Resp: 16 16  Temp: 97.6 F (36.4 C) 98.3 F (36.8 C)  SpO2: 98% 96%   Gen: alert, NAD CV: RRR Resp: CTAB  Neurologic exam MS: awake, alert able to tell me her full name today, does not follow simple commands but can mimic Speech: mild dysarthria, unable to name and repeat CN: PERRL, EOMI, corneals intact, face symmetric at rest, hearing intact to voice Motor: No abnormal movements on exam today. Anti-gravity BUE, BLE not examined 2/2 wounds Sensory: SILT DTRs: 3+ and symmetric BUE, areflexic bilat patellae, plantars deferred 2/2 wound Cerebellar, gait: UTA  Assessment:  75 y.o. female with a PMHx of chronic diastolic HF, CKD4, Charcot foot, anemia, DM, HLD, HTN, hypothyroidism and prior toe amputation who presented to the hospital on 12/13 with a several-week history of worsening diabetic left heel ulcer that progressed to the point that it was draining purulent material. She was admitted for debridement and possible endovascular intervention. She was started on IV antibiotics and Podiatry was consulted in addition to Vascular Surgery. She underwent left foot wound debridement and was found to have osteiomyelitis in the calcaneus and  medullary canal. BKA was then recommended, but patient has so far not been receptive to this option. Neurology consulted on 03/25/21 2/2 abnormal movements and patient not interacting with providers. Exam 03/25/21 revealed myoclonic-like changes in tone to BUE triggered consistently by passive movement. Today she has subtle low amplitude low frequency writhing movements of her upper trunk and BUE which appear somewhat choreoathetotic. No myoclonus on my exam. MRI brain wo contrast NAICP. Cefepime d/c'd 12/18. No neck stiffness or fever to suggest a meningitis. EEG 03/26/21 interpreted by me shows continuous triphasics, initially at 1 Hz, highly rhythmic, with episodic increases in frequency to 2-2.5 Hz with possible subtle evolution followed by brief periods of relative suppression c/f possible nonconvulsive seizure activity.  Repeat EEG today shows frequent triphasics, at times continuous up to 1Hz , no seizure activity. Much improved.     Recommendations: - Cont keppra 500mg  IV q 12 hrs (renal dosing) - Seizure precautions - STAT head CT for decline in neurologic exam - Will follow clinical exam. No indication to transfer for cEEG at this time  Su Monks, MD [facility] [phone]  If 7pm- 7am, please page neurology on call as listed in [place].

## 2021-03-29 DIAGNOSIS — E440 Moderate protein-calorie malnutrition: Secondary | ICD-10-CM | POA: Insufficient documentation

## 2021-03-29 LAB — CBC WITH DIFFERENTIAL/PLATELET
Abs Immature Granulocytes: 0.01 10*3/uL (ref 0.00–0.07)
Basophils Absolute: 0 10*3/uL (ref 0.0–0.1)
Basophils Relative: 1 %
Eosinophils Absolute: 0.2 10*3/uL (ref 0.0–0.5)
Eosinophils Relative: 2 %
HCT: 26.6 % — ABNORMAL LOW (ref 36.0–46.0)
Hemoglobin: 8.5 g/dL — ABNORMAL LOW (ref 12.0–15.0)
Immature Granulocytes: 0 %
Lymphocytes Relative: 36 %
Lymphs Abs: 3 10*3/uL (ref 0.7–4.0)
MCH: 29.4 pg (ref 26.0–34.0)
MCHC: 32 g/dL (ref 30.0–36.0)
MCV: 92 fL (ref 80.0–100.0)
Monocytes Absolute: 1.2 10*3/uL — ABNORMAL HIGH (ref 0.1–1.0)
Monocytes Relative: 15 %
Neutro Abs: 3.9 10*3/uL (ref 1.7–7.7)
Neutrophils Relative %: 46 %
Platelets: 202 10*3/uL (ref 150–400)
RBC: 2.89 MIL/uL — ABNORMAL LOW (ref 3.87–5.11)
RDW: 14.1 % (ref 11.5–15.5)
WBC: 8.4 10*3/uL (ref 4.0–10.5)
nRBC: 0 % (ref 0.0–0.2)

## 2021-03-29 LAB — COMPREHENSIVE METABOLIC PANEL
ALT: 7 U/L (ref 0–44)
AST: 18 U/L (ref 15–41)
Albumin: 2.3 g/dL — ABNORMAL LOW (ref 3.5–5.0)
Alkaline Phosphatase: 56 U/L (ref 38–126)
Anion gap: 3 — ABNORMAL LOW (ref 5–15)
BUN: 22 mg/dL (ref 8–23)
CO2: 25 mmol/L (ref 22–32)
Calcium: 8.5 mg/dL — ABNORMAL LOW (ref 8.9–10.3)
Chloride: 111 mmol/L (ref 98–111)
Creatinine, Ser: 1.72 mg/dL — ABNORMAL HIGH (ref 0.44–1.00)
GFR, Estimated: 31 mL/min — ABNORMAL LOW (ref >60)
Glucose, Bld: 125 mg/dL — ABNORMAL HIGH (ref 70–99)
Potassium: 3.2 mmol/L — ABNORMAL LOW (ref 3.5–5.1)
Sodium: 139 mmol/L (ref 135–145)
Total Bilirubin: 0.5 mg/dL (ref 0.3–1.2)
Total Protein: 7.1 g/dL (ref 6.5–8.1)

## 2021-03-29 LAB — GLUCOSE, CAPILLARY
Glucose-Capillary: 112 mg/dL — ABNORMAL HIGH (ref 70–99)
Glucose-Capillary: 114 mg/dL — ABNORMAL HIGH (ref 70–99)
Glucose-Capillary: 120 mg/dL — ABNORMAL HIGH (ref 70–99)
Glucose-Capillary: 151 mg/dL — ABNORMAL HIGH (ref 70–99)
Glucose-Capillary: 152 mg/dL — ABNORMAL HIGH (ref 70–99)
Glucose-Capillary: 185 mg/dL — ABNORMAL HIGH (ref 70–99)

## 2021-03-29 MED ORDER — AMOXICILLIN 500 MG PO CAPS
500.0000 mg | ORAL_CAPSULE | Freq: Three times a day (TID) | ORAL | Status: DC
  Administered 2021-03-29 – 2021-03-31 (×6): 500 mg via ORAL
  Filled 2021-03-29 (×8): qty 1

## 2021-03-29 MED ORDER — DOXYCYCLINE HYCLATE 100 MG PO TABS: 100.0000 mg | ORAL_TABLET | Freq: Two times a day (BID) | ORAL | Status: DC

## 2021-03-29 MED ORDER — DOXYCYCLINE HYCLATE 100 MG PO TABS
100.0000 mg | ORAL_TABLET | Freq: Two times a day (BID) | ORAL | Status: DC
  Administered 2021-03-29 – 2021-03-31 (×4): 100 mg via ORAL
  Filled 2021-03-29 (×4): qty 1

## 2021-03-29 MED ORDER — POTASSIUM CHLORIDE CRYS ER 20 MEQ PO TBCR
40.0000 meq | EXTENDED_RELEASE_TABLET | Freq: Two times a day (BID) | ORAL | Status: DC
  Administered 2021-03-29 – 2021-03-31 (×5): 40 meq via ORAL
  Filled 2021-03-29 (×5): qty 2

## 2021-03-29 NOTE — Care Management Important Message (Signed)
Important Message  Patient Details  Name: [name] [name] MRN: [id number] Date of Birth: May 19, 1945   Medicare Important Message Given:  Yes     Juliann Pulse A Staisha Winiarski 03/29/2021, 3:13 PM

## 2021-03-29 NOTE — Progress Notes (Signed)
Date of Admission:  03/20/2021      ID: GLADYSE CORVIN is a 75 y.o. 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)   Diabetic foot ulcer (HCC)   Acute metabolic encephalopathy   Acute urinary retention   Osteomyelitis (HCC)   Atrial tachycardia (HCC)   Abnormal EEG   Malnutrition of moderate degree    Subjective: More awake and alert Says she is okay  Medications:   amLODipine  10 mg Oral Daily   amoxicillin  500 mg Oral Q8H   aspirin EC  81 mg Oral Daily   Or   aspirin  300 mg Rectal Daily   atorvastatin  40 mg Oral Daily   Chlorhexidine Gluconate Cloth  6 each Topical Daily   donepezil  10 mg Oral QHS   doxycycline  100 mg Oral Q12H   feeding supplement  237 mL Oral TID BM   insulin aspart  0-6 Units Subcutaneous Q4H   levothyroxine  50 mcg Oral Q0600   multivitamin with minerals  1 tablet Oral Daily   polyethylene glycol  17 g Oral Daily   potassium chloride  40 mEq Oral BID    Objective: Vital signs in last 24 hours: Temp:  [98.3 F (36.8 C)-99.5 F (37.5 C)] 98.9 F (37.2 C) (12/22 1742) Pulse Rate:  [67-83] 77 (12/22 1742) Resp:  [16-18] 16 (12/22 1742) BP: (122-148)/(51-82) 139/53 (12/22 1742) SpO2:  [94 %-96 %] 94 % (12/22 1742)  PHYSICAL EXAM:  General: more awake , answering simple questions, pale chronically ill Lungs: b/l air entry Heart:s1s2 Abdomen: Soft, non-tender,not distended. Bowel sounds normal. No masses Extremities: left heel ulcer. Does not probe to bone      Skin: No rashes or lesions. Or bruising Lymph: Cervical, supraclavicular normal. Neurologic: Grossly non-focal  Lab Results Recent Labs    03/28/21 0847 03/29/21 0330  WBC 7.5 8.4  HGB 9.6* 8.5*  HCT 29.1* 26.6*  NA 140 139   K 3.0* 3.2*  CL 108 111  CO2 26 25  BUN 27* 22  CREATININE 1.91* 1.72*   Liver Panel Recent Labs    03/28/21 0847 03/29/21 0330  PROT 7.4 7.1  ALBUMIN 2.4* 2.3*  AST 15 18  ALT 7 7  ALKPHOS 59 56  BILITOT 0.9 0.5  Microbiology: MRSA, enterococcus in left heel wound Studies/Results: EEG adult  Result Date: 03/27/2021 Derek Jack, MD     03/29/2021  1:10 PM Routine EEG Report ARDYN FORGE is a 75 y.o. female with a history of encephalopathy who is undergoing an EEG to evaluate for seizures. Report: This EEG was acquired with electrodes placed according to the International 10-20 electrode system (including Fp1, Fp2, F3, F4, C3, C4, P3, P4, O1, O2, T3, T4, T5, T6, A1, A2, Fz, Cz, Pz). The following electrodes were missing or displaced: none. The occipital dominant rhythm was 6 Hz. This activity is reactive to stimulation. Drowsiness was manifested by background fragmentation; deeper stages of sleep were not identified. There was no focal slowing. There are frequent broad generalized discharges with triphasic morphology, at times periodic up to 1 Hz, that do not appear epileptiform. There are no definitive interictal epileptiform discharges. There were no electrographic seizures identified. There was  no abnormal response to photic stimulation. Hyperventilation was not performed. Impression and clinical correlation: This EEG was obtained while awake and drowsy and is abnormal due: - Moderate diffuse slowing - Triphasic waves indicating metabolic encephalopathy that do not appear epileptiform EEG significantly improved since yesterday with no activity concerning for nonconvulsive seizure. Su Monks, MD [facility] [phone] If 7pm- 7am, please page neurology on call as listed in [place].     Assessment/Plan: Left heel decubitus wound status postdebridement.  Bone biopsy shows acute on chronic osteomyelitis. Bone culture has MRSA and Enterococcus faecalis Patient  refused BKA. Currently on daptomycin because of  AKI on CKD. Because of patient's immobility and poor medical condition IV antibiotic alone is not going to heal the ulcer. Discussed with son- will do PO   Doxy/amoxicillin for 4 weeks Need to follow up with wound clinic' Will follow her as well on 04/26/21 at 11.30 am  Encephalopathy.  Concern for cefepime CNS side effect in the setting of CKD.  This has been discontinued on 03/25/2021. EEG showed changes suggestive of cerebral dysfunction as well as nonconvulsive seizures state Some improvement MRI showed brain parenchymal loss secondary to age as well as old lacunar infarcts.  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     AKI on CKD  Anemia  Hypothyroidism.  on synthroid- TSH Okay  Discussed the management with patient and care team.  ID will sign off- call if needed

## 2021-03-29 NOTE — Progress Notes (Addendum)
PROGRESS NOTE   [name] [name]  BWL:[id number] DOB: Oct 25, 1945 DOA: 03/20/2021 PCP: Marguerita Merles, MD  Brief Narrative:   75 year old African-American female home dwelling typically nonambulatory WC Charcot arthropathy followed by podiatry  prior right second toe infection status post revascularization 05/2020 and amputation discharged on Augmentin HTN CKD 4 DM TY 2 (pancreatitis while on Victoza) HLD Hypothyroid Prior episodic encephalopathy during 11/2019 admission underlying mild dementia reported  Developed pressure ulcer left heel after rehab stay-Home health nurse found significant infection beneath the scab and patient brought to ED 12/13 Found to have decubitus ulcer to the left heel  Cardiology consulted and patient cleared without restriction for surgery 12/14 vascular evaluated- status post percutaneous angioplasty left peroneal Dr. 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 12/18-not engaging in conversation and neurology consulted for altered mental status possible seizures  Infectious disease consulted as patient unwilling to perform left AKA--cefepime changed to daptomycin   Hospital-Problem based course  Toxic metabolic encephalopathy since 12/17--possibly related to cefepime MRI nonfocal, B12 low normal and given 1 dose of IM B12 TSH normal, methylmalonic acid 12/19 is still pending Appreciate neurology, infectious disease input EEG repeat 12/20 no seizure activity--triphasic Per neurology should continue Keppra 500 every 12 Overall has improved significantly Osteomyelitis and diabetic foot ulcer Angiogram 12/14 and D12/15 MRSA and E faecalis on bone biopsy 12/15 Patient resistant to amputation although has been recommended to her Switching Dapto-->1 month long-term antibiotics Doxy Amoxil per ID DM TY 2 with complication of hypoglycemia earlier this admission CBGs ranging  110-190 continue only very sensitive sliding scale CKD 4 due to diabetes Hypokalemia Replace potassium with K. Dur 40 and increase to twice daily Saline lock IV Hypothyroid Continue Synthroid change over to p.o. 50 mcg  Hypertension Continue amlodipine 10 ?  Dementia Continue Aricept 10 at bedtime Urinary retention Foley placed this admission Voiding trial to be done in a.m.  DVT prophylaxis: SCD Code Status: Full Family Communication: called and d/w son Anaisabel Pederson [phone] in detail-he understands the antibiotics are mainly suppressive not curative and he has had discussions with her about amputation Disposition:  Status is: Inpatient   will need therapy and will need to reassess--- likely can discharge home in 24 to 48 hours    Consultants:  Infectious disease Vascular Orthopedics  Procedures: As above  Antimicrobials: Daptomycin   Subjective:  Awake coherent no distress--she is much more awake No pain   Objective: Vitals:   03/28/21 1624 03/28/21 2004 03/29/21 0400 03/29/21 0918  BP: (!) 145/80 138/82 (!) 148/62 (!) 132/51  Pulse: 78 75 67 83  Resp: 16 18 16 18   Temp: 99.3 F (37.4 C) 99.5 F (37.5 C) 98.3 F (36.8 C) 98.7 F (37.1 C)  TempSrc:      SpO2: 95% 96% 94% 94%  Weight:      Height:        Intake/Output Summary (Last 24 hours) at 03/29/2021 1216 Last data filed at 03/29/2021 6384 Gross per 24 hour  Intake 1699.68 ml  Output 1375 ml  Net 324.68 ml    Filed Weights   03/20/21 1446  Weight: 90.7 kg    Examination:  Awake alert coherent no distress EOMI NCAT  CTA B no rales no rhonchi Abdomen soft no rebound no guarding S1-S2 no murmur no rub no gallop Both wounds and Prevalon boots and wrapped Neurologically intact to movement and moving arms and legs did not  notice any abnormal fasciculations       Data Reviewed: personally reviewed   CBC    Component Value Date/Time   WBC 8.4 03/29/2021 0330   RBC  2.89 (L) 03/29/2021 0330   HGB 8.5 (L) 03/29/2021 0330   HGB 10.7 (L) 04/17/2014 1131   HCT 26.6 (L) 03/29/2021 0330   HCT 32.9 (L) 04/17/2014 1131   PLT 202 03/29/2021 0330   PLT 226 04/17/2014 1131   MCV 92.0 03/29/2021 0330   MCV 91 04/17/2014 1131   MCH 29.4 03/29/2021 0330   MCHC 32.0 03/29/2021 0330   RDW 14.1 03/29/2021 0330   RDW 13.8 04/17/2014 1131   LYMPHSABS 3.0 03/29/2021 0330   LYMPHSABS 2.4 04/21/2013 0108   MONOABS 1.2 (H) 03/29/2021 0330   MONOABS 0.8 04/21/2013 0108   EOSABS 0.2 03/29/2021 0330   EOSABS 0.1 04/21/2013 0108   BASOSABS 0.0 03/29/2021 0330   BASOSABS 0.1 04/21/2013 0108   CMP Latest Ref Rng & Units 03/29/2021 03/28/2021 03/27/2021  Glucose 70 - 99 mg/dL 125(H) 140(H) 85  BUN 8 - 23 mg/dL 22 27(H) 34(H)  Creatinine 0.44 - 1.00 mg/dL 1.72(H) 1.91(H) 2.11(H)  Sodium 135 - 145 mmol/L 139 140 144  Potassium 3.5 - 5.1 mmol/L 3.2(L) 3.0(L) 3.5  Chloride 98 - 111 mmol/L 111 108 111  CO2 22 - 32 mmol/L 25 26 22   Calcium 8.9 - 10.3 mg/dL 8.5(L) 8.6(L) 9.1  Total Protein 6.5 - 8.1 g/dL 7.1 7.4 7.5  Total Bilirubin 0.3 - 1.2 mg/dL 0.5 0.9 1.0  Alkaline Phos 38 - 126 U/L 56 59 60  AST 15 - 41 U/L 18 15 14(L)  ALT 0 - 44 U/L 7 7 6      Radiology Studies: No results found.   Scheduled Meds:  amLODipine  10 mg Oral Daily   aspirin EC  81 mg Oral Daily   Or   aspirin  300 mg Rectal Daily   atorvastatin  40 mg Oral Daily   Chlorhexidine Gluconate Cloth  6 each Topical Daily   donepezil  10 mg Oral QHS   feeding supplement  237 mL Oral TID BM   insulin aspart  0-6 Units Subcutaneous Q4H   levothyroxine  50 mcg Oral Q0600   multivitamin with minerals  1 tablet Oral Daily   polyethylene glycol  17 g Oral Daily   potassium chloride  40 mEq Oral BID   Continuous Infusions:  DAPTOmycin (CUBICIN)  IV Stopped (03/28/21 2129)   levETIRAcetam Stopped (03/29/21 7342)     LOS: 8 days   Time spent: [place], MD [facility] To  contact the attending provider between 7A-7P or the covering provider during after hours 7P-7A, please log into the web site [email or web address] and access using universal  password for that web site. If you do not have the password, please call the hospital operator.  03/29/2021, 12:16 PM

## 2021-03-29 NOTE — Procedures (Signed)
Routine EEG Report  [name] [name] is a 75 y.o. female with a history of encephalopathy who is undergoing an EEG to evaluate for seizures.  Report: This EEG was acquired with electrodes placed according to the International 10-20 electrode system (including Fp1, Fp2, F3, F4, C3, C4, P3, P4, O1, O2, T3, T4, T5, T6, A1, A2, Fz, Cz, Pz). The following electrodes were missing or displaced: none.  The occipital dominant rhythm was 6 Hz. This activity is reactive to stimulation. Drowsiness was manifested by background fragmentation; deeper stages of sleep were not identified. There was no focal slowing. There are frequent broad generalized discharges with triphasic morphology, at times periodic up to 1 Hz, that do not appear epileptiform. There are no definitive interictal epileptiform discharges. There were no electrographic seizures identified. There was no abnormal response to photic stimulation. Hyperventilation was not performed.  Impression and clinical correlation: This EEG was obtained while awake and drowsy and is abnormal due: - Moderate diffuse slowing - Triphasic waves indicating metabolic encephalopathy that do not appear epileptiform  EEG significantly improved since yesterday with no activity concerning for nonconvulsive seizure.  Su Monks, MD [facility] [phone]  If 7pm- 7am, please page neurology on call as listed in [place].

## 2021-03-30 LAB — BASIC METABOLIC PANEL
Anion gap: 4 — ABNORMAL LOW (ref 5–15)
BUN: 24 mg/dL — ABNORMAL HIGH (ref 8–23)
CO2: 23 mmol/L (ref 22–32)
Calcium: 8.6 mg/dL — ABNORMAL LOW (ref 8.9–10.3)
Chloride: 111 mmol/L (ref 98–111)
Creatinine, Ser: 1.8 mg/dL — ABNORMAL HIGH (ref 0.44–1.00)
GFR, Estimated: 29 mL/min — ABNORMAL LOW (ref >60)
Glucose, Bld: 108 mg/dL — ABNORMAL HIGH (ref 70–99)
Potassium: 4.3 mmol/L (ref 3.5–5.1)
Sodium: 138 mmol/L (ref 135–145)

## 2021-03-30 LAB — GLUCOSE, CAPILLARY
Glucose-Capillary: 102 mg/dL — ABNORMAL HIGH (ref 70–99)
Glucose-Capillary: 110 mg/dL — ABNORMAL HIGH (ref 70–99)
Glucose-Capillary: 110 mg/dL — ABNORMAL HIGH (ref 70–99)
Glucose-Capillary: 131 mg/dL — ABNORMAL HIGH (ref 70–99)
Glucose-Capillary: 177 mg/dL — ABNORMAL HIGH (ref 70–99)
Glucose-Capillary: 195 mg/dL — ABNORMAL HIGH (ref 70–99)

## 2021-03-30 MED ORDER — INSULIN ASPART PROT & ASPART (70-30 MIX) 100 UNIT/ML ~~LOC~~ SUSP
15.0000 [IU] | Freq: Two times a day (BID) | SUBCUTANEOUS | 11 refills | Status: DC
Start: 2021-03-30 — End: 2021-03-30

## 2021-03-30 MED ORDER — DOXYCYCLINE HYCLATE 100 MG PO TABS: 100.0000 mg | ORAL_TABLET | Freq: Two times a day (BID) | ORAL | 0 refills | Status: AC

## 2021-03-30 MED ORDER — LEVETIRACETAM 250 MG PO TABS: 500.0000 mg | ORAL_TABLET | Freq: Two times a day (BID) | ORAL | 0 refills | Status: DC

## 2021-03-30 MED ORDER — LEVETIRACETAM 500 MG PO TABS: 500.0000 mg | ORAL_TABLET | Freq: Two times a day (BID) | ORAL | 3 refills | Status: DC

## 2021-03-30 MED ORDER — INSULIN ASPART PROT & ASPART (70-30 MIX) 100 UNIT/ML ~~LOC~~ SUSP: 5.0000 [IU] | Freq: Two times a day (BID) | SUBCUTANEOUS | 11 refills | Status: DC

## 2021-03-30 MED ORDER — SODIUM CHLORIDE 0.9 % IV SOLN: INTRAVENOUS | Status: DC | PRN

## 2021-03-30 MED ORDER — AMOXICILLIN 500 MG PO CAPS: 500.0000 mg | ORAL_CAPSULE | Freq: Three times a day (TID) | ORAL | 0 refills | Status: AC

## 2021-03-30 MED ORDER — ACETAMINOPHEN 325 MG PO TABS: 650.0000 mg | ORAL_TABLET | Freq: Four times a day (QID) | ORAL | Status: AC | PRN

## 2021-03-30 NOTE — TOC Progression Note (Signed)
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, the company is working to increase the hours, He stated that they do not need a hoyer lift or other DME that they have all they need, he stated that she does not need HH for PT that she is at her baseline, She will need EMS to transfer to home when she is [facility]         Expected Discharge Plan and [place] (SDOH) Interventions    Readmission Risk Interventions Readmission Risk Prevention Plan 06/01/2020 05/18/2020 11/19/2019  Transportation Screening Complete Complete Complete  PCP or Specialist Appt within 3-5 Days - Complete Complete  HRI or Home Care Consult - Complete Complete  Social Work Consult for [place] Planning/Counseling - Complete Complete  Palliative Care Screening - Not Applicable Complete  Medication Review Press photographer) Complete Complete Complete  PCP or Specialist appointment within 3-5 days of discharge Complete - -  SW Recovery Care/Counseling Consult Complete - -  [place] Not Applicable - -  Some recent data might be hidden

## 2021-03-30 NOTE — TOC Progression Note (Signed)
Transition of Care [facility]) - Progression Note    Patient Details  Name: [name] [name] MRN: [id number] Date of Birth: 1945-07-27  Transition of Care [facility]) CM/SW [place], RN Phone Number: 03/30/2021, 2:19 PM  Clinical Narrative:   Delorse Limber her son who is her caregiver, he will call me once he is able to be there for EMS to take her home         Expected Discharge Plan and Services           Expected Discharge Date: 03/30/21                                     Social Determinants of Health (SDOH) Interventions    Readmission Risk Interventions Readmission Risk Prevention Plan 06/01/2020 05/18/2020 11/19/2019  Transportation Screening Complete Complete Complete  PCP or Specialist Appt within 3-5 Days - Complete Complete  HRI or [place] - Complete Complete  Social Work Consult for [place] Planning/Counseling - Complete Complete  Palliative Care Screening - Not Applicable Complete  Medication Review Press photographer) Complete Complete Complete  PCP or Specialist appointment within 3-5 days of discharge Complete - -  SW Recovery Care/Counseling Consult Complete - -  [place] Not Applicable - -  Some recent data might be hidden
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
## 2021-03-30 NOTE — Progress Notes (Addendum)
Speech Language Pathology Treatment: Dysphagia  Patient Details Name: [name] [name] MRN: [id number] DOB: November 21, 1945 Today's Date: 03/30/2021 Time: 3500-9381 SLP Time Calculation (min) (ACUTE ONLY): 35 min  Assessment / Plan / Recommendation Clinical Impression  Pt appears to present w/ Mild+ oral phase dysphagia in light of declined Cognitive presentation; unsure of pt's Baseline Cognitive status in general. Any decline in Cognitiion can impact overall awareness/timing of swallow and safety during po tasks which increases risk for aspiration, choking. Pt's risk for aspiration is reduced when following general aspiration precautions and using a modified diet consistency to support the oral phase. Of note, pt had been recommended a mech soft diet consistency when seen by ST services in 2021. A minced foods diet is beneficial if any Dentition issues.   During this tx session, she required min-mod verbal/tactile/visual cues for follow through during po tasks. Noted pt mumbling to herself intermittently. She often refused support/help and requested to feed self -- slow, deliberate movements but adequate w/ min support given. W/ po trials at her lunch meal, pt exhibited no immediate, overt clinical s/s of aspiration noted; no decline in vocal quality; no cough, and no decline in respiratory status during/post trials. Oral phase was grossly adequate for bolus management and oral clearing of the liquid and puree boluses given Time. Mastication of MINCED solids was min lengthy and disorganized w/ increased Time needed for management and eventual clearing. Pt was given verbal/tactile cues to use lingual sweeping to aid oral clearing. She needed guidance d/t confusion w/ tasks.           D/t pt's presentation of declined Cognitive status, and her risk for aspiration/choking, recommend continue the dysphagia level 2(minced foods moistened) w/ thin liquids via Cup/straw w/ monitoring; aspiration precautions; reduce  Distractions during meals and engage pt during po's at meal for self-feeding as able. Pills Crushed in Puree for safer swallowing. Support w/ feeding and Supervision at all meals. MD/NSG updated. ST services recommends follow w/ Palliative Care for [place] and education re: impact of any Cognitive decline on swallowing and overall oral intake. No further acute ST services indicated currently. Pt appears close to/at her baseline. If any new needs while admitted, MD can reconsult ST services. Handouts left in room for family re: MINCED foods diet consistency and general aspiration precautions.     HPI HPI: Pt  is a 75 y.o. female with a PMH significant for Type II DM, HTN, CKD 4, hypothyroidism, H LD, GERD, Charcot foot - chronic diabetic food issues, anemia.  They presented from home where she lives with her son to the ED on 03/20/2021 with left heel ulcer x several weeks.  At baseline, patient does not walk or perform any of her own ADLs.  They have a nursing assistant to address the home to provide her care and was the one who noticed her foot wound today was draining purulent material.   She was admitted for debridement and possible endovascular intervention. She was started on IV antibiotics and Podiatry was consulted in addition to Vascular Surgery. 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 BKA was then recommended, but patient has so far not been receptive to this option.  Neurology following for possible seizure; encephalopathy.  MRI: No acute intracranial process; atrophy with moderate periventricular small vessel disease.. Lungs clear per MD.      SLP Plan         Recommendations for follow up  therapy are one component of a multi-disciplinary discharge planning process, led by the attending physician.  Recommendations may be updated based on patient status, additional functional criteria and insurance authorization.     Recommendations  Diet recommendations: Dysphagia 2 (fine chop);Thin liquid Liquids provided via: Cup;Straw (monitor) Medication Administration: Crushed with puree Supervision: Patient able to self feed;Staff to assist with self feeding;Intermittent supervision to cue for compensatory strategies Compensations: Minimize environmental distractions;Slow rate;Small sips/bites;Lingual sweep for clearance of pocketing;Multiple dry swallows after each bite/sip;Follow solids with liquid Postural Changes and/or Swallow Maneuvers: Out of bed for meals;Seated upright 90 degrees;Upright 30-60 min after meal                General recommendations:  (Dietician f/u; Palliative care f/u for GOC) Oral Care Recommendations: Oral care BID;Oral care before and after PO;Staff/trained caregiver to provide oral care Follow Up Recommendations: Skilled nursing-short term rehab (<3 hours/day) Assistance recommended at discharge: Frequent or constant Supervision/Assistance SLP Visit Diagnosis: Dysphagia, oral phase (R13.11) (apparent Cognitive decline in her behavior/decisions)            Orinda Kenner, [place], [place] Speech Language Pathologist Rehab Services [phone] [facility]  03/30/2021, 1:42 PM
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
## 2021-03-30 NOTE — Discharge Summary (Addendum)
Physician Discharge Summary  [name] [name] ZOX:[id number] DOB: Mar 30, 1946 DOA: 03/20/2021  PCP: Marguerita Merles, MD  Admit date: 03/20/2021 Discharge date: 03/30/2021  Time spent: 37 minutes  Recommendations for Outpatient Follow-up:  Patient very high risk for readmission given noncompliance and wish for limb preserving strategies in the setting of extensive osteomyelitis Continue Amoxil doxycycline as per below until 04/16/2021--will need outpatient follow-up with Dr. Tama High which will be arranged by her--patient will need to call to confirm appointment in a timely Recommend CBC Chem-12 in 1 week--- she was getting potassium replacement and this may need to be adjusted This admission started on Keppra because of metabolic encephalopathy thought to be related to seizure-would continue at a lower dose of 250 mg twice daily per Dr. Quinn Axe and then follow-up with neurology-we will need coordination with outpatient neurologist in about a month to discuss weaning the same Will need follow-up of our outpatient referral to wound clinic Follow methylmalonic acid from 12/19 Home health RN ordered to supervise wound management Will need Foley catheter management for Foley that was placed this admission secondary to incontinence--will require outpatient referral to urology by PCP for voiding trial in 3 to 4 weeks  Discharge Diagnoses:  MAIN problem for hospitalization   Osteomyelitis and diabetic foot ulcer with MRSA and E faecalis growing   Please see below for itemized issues addressed in [place]- refer to other progress notes for clarity if needed  Discharge Condition: Guarded   Diet recommendation: diabetic  Filed Weights   03/20/21 1446  Weight: 90.7 kg    History of present illness:  75 year old African-American female home dwelling typically nonambulatory WC Charcot arthropathy followed by podiatry  prior right second toe infection status post revascularization 05/2020 and  amputation discharged on Augmentin HTN CKD 4 DM TY 2 (pancreatitis while on Victoza) HLD Hypothyroid Prior episodic encephalopathy during 11/2019 admission underlying mild dementia reported   Developed pressure ulcer left heel after rehab stay-Home health nurse found significant infection beneath the scab and patient brought to ED 12/13 Found to have decubitus ulcer to the left heel  Cardiology consulted and patient cleared without restriction for surgery 12/14 vascular evaluated- status post percutaneous angioplasty left peroneal Dr. 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 12/18-not engaging in conversation and neurology consulted for altered mental status possible seizures   Infectious disease consulted as patient unwilling to perform left AKA--cefepime changed to daptomycin  Hospital Course:  Toxic metabolic encephalopathy since 12/17--possibly related to cefepime MRI nonfocal, B12 low normal and given 1 dose of IM B12 TSH normal, methylmalonic acid 12/19 is still pending Appreciate neurology, infectious disease input EEG repeat 12/20 no seizure activity--triphasic Per neurology wean keppra-->250 every 12 and have converted her to p.o.-this can be revisited in the outpatient Overall has improved significantly Osteomyelitis and diabetic foot ulcer Angiogram 12/14 and D12/15 MRSA and E faecalis on bone biopsy 12/15 Patient resistant to amputation although has been recommended to her Switching Dapto-->1 month long-term antibiotics Doxy Amoxil per ID I have discussed this with her son as well and he is aware of her refusal DM TY 2 with complication of hypoglycemia earlier this admission CBGs ranging low 100s and was on very sensitive sliding scale Resumed glycemic control with much lower dose of 7035 units twice daily discontinue Tresiba from prior to admission CKD 4 due to  diabetes Hypokalemia Replaced potassium with K. Dur 40--resolved on discharge Requires outpatient follow-up Hypothyroid  Continue Synthroid 50 mcg  Hypertension Continue amlodipine 10 ?  Dementia Continue Aricept 10 at bedtime Urinary retention Foley placed this admission Voiding trial as an outpatient Stage III decubitus ulcer on sacrum from prior to admission Not infected appearing-change in turn and keep dressings  Discharge Exam: Vitals:   03/30/21 0805 03/30/21 1151  BP: 125/84 (!) 147/61  Pulse: 85 84  Resp: 20 18  Temp: 98.6 F (37 C) 98.9 F (37.2 C)  SpO2: 95% 97%    Subj on day of d/c   Awake, coherent-yesterday had some inconsistencies in what she was telling me however this seems resolved She is complaining of bottom pain She has no other complaints  General Exam on discharge  Awake alert coherent S1-S2 no murmur no rub no gallop Abdomen obese nontender no rebound no guarding ROM intact Neurologically intact no focal deficit Wound not examined today   Discharge Instructions   Discharge Instructions     Diet - low sodium heart healthy   Complete by: As directed    Discharge instructions   Complete by: As directed    You were diagnosed with infection in your left lower extremity which is chronic and probably not on able to heal on its own I would recommend that you seriously consider in the near future and amputation of the left foot You will be given suppressive antibiotics until next month please follow directions and complete them You will need follow-up with wound clinic and we will try to make ambulatory referral to wound clinic for you to be seen by specialist Please follow-up with infectious disease Dr. Levester Fresh as well who will need to see you for your infection We will try to get a home health nurse to come out and help  You were also diagnosed this admission with confusion which could have been subclinical seizures but was felt more  likely secondary to the antibiotic cefepime that you received  so continue Keppra until he can follow-up with neurology in the outpatient setting-this may be discontinued in 2 to 3 months if you do not have any further symptoms   Discharge wound care:   Complete by: As directed    Wet-to-dry dressings on bottom of sacrum and frequent turning Change dressing to left heel daily. Clean wound with saline then apply betadine wet to dry followed by 4x4, kerlix, ACE. Pad all prominent areas.   Increase activity slowly   Complete by: As directed       Allergies as of 03/30/2021   No Known Allergies      Medication List     STOP taking these medications    carvedilol 25 MG tablet Commonly known as: COREG   Tresiba FlexTouch 100 UNIT/ML FlexTouch Pen Generic drug: insulin degludec       TAKE these medications    acetaminophen 325 MG tablet Commonly known as: TYLENOL Take 2 tablets (650 mg total) by mouth every 6 (six) hours as needed for mild pain (or Fever >/= 100.4).   amLODipine 10 MG tablet Commonly known as: NORVASC Take 1 tablet by mouth daily.   amoxicillin 500 MG capsule Commonly known as: AMOXIL Take 1 capsule (500 mg total) by mouth every 8 (eight) hours for 17 days.   ASPIRIN 81 PO Take by mouth daily.   atorvastatin 40 MG tablet Commonly known as: LIPITOR Take 1 tablet by mouth at bedtime.   bisacodyl 10 MG suppository Commonly known as: Dulcolax Place 1 suppository (10 mg total) rectally  as needed for moderate constipation.   donepezil 10 MG tablet Commonly known as: ARICEPT Take 10 mg by mouth at bedtime.   doxycycline 100 MG tablet Commonly known as: VIBRA-TABS Take 1 tablet (100 mg total) by mouth every 12 (twelve) hours for 17 days.   gabapentin 100 MG capsule Commonly known as: NEURONTIN Take 1 capsule (100 mg total) by mouth 2 (two) times daily as needed for up to 14 days.   insulin aspart protamine- aspart (70-30) 100 UNIT/ML  injection Commonly known as: NOVOLOG MIX 70/30 Inject 0.05 mLs (5 Units total) into the skin 2 (two) times daily with a meal. What changed: how much to take   levETIRAcetam 250 MG tablet Commonly known as: Keppra Take 2 tablets (500 mg total) by mouth 2 (two) times daily.   polyethylene glycol 17 g packet Commonly known as: MIRALAX / GLYCOLAX Take 17 g by mouth daily. Mix one tablespoon with 8oz of your favorite juice or water every day until you are having soft formed stools. Then start taking once daily if you didn't have a stool the day before.   Synthroid 50 MCG tablet Generic drug: levothyroxine Take 1 tablet by mouth daily.               Discharge Care Instructions  (From admission, onward)           Start     Ordered   03/30/21 0000  Discharge wound care:       Comments: Wet-to-dry dressings on bottom of sacrum and frequent turning Change dressing to left heel daily. Clean wound with saline then apply betadine wet to dry followed by 4x4, kerlix, ACE. Pad all prominent areas.   03/30/21 1215           No Known Allergies    The results of significant diagnostics from this hospitalization (including imaging, microbiology, ancillary and laboratory) are listed below for reference.    Significant Diagnostic Studies: DG Knee 2 Views Left  Result Date: 03/20/2021 CLINICAL DATA:  Bilateral knee pain and swelling, fell EXAM: RIGHT KNEE - 1-2 VIEW; LEFT KNEE - 1-2 VIEW COMPARISON:  03/07/2015 FINDINGS: Left knee: Frontal and lateral views are obtained. The bones are severely osteopenic. No acute fracture, subluxation, or dislocation. There is severe 3 compartmental osteoarthritis greatest in the patellofemoral compartment. No joint effusion. Diffuse atherosclerosis. Right knee: Frontal and lateral views are obtained. Bones are severely osteopenic. No fracture, subluxation, or dislocation. Severe 3 compartmental osteoarthritis greatest in the medial and lateral  compartments. Trace joint effusion. Diffuse atherosclerosis. IMPRESSION: 1. No acute displaced fractures. 2. Severe bilateral 3 compartmental osteoarthritis. 3. Severe osteopenia. Electronically Signed   By: Randa Ngo M.D.   On: 03/20/2021 16:16   DG Knee 2 Views Right  Result Date: 03/20/2021 CLINICAL DATA:  Bilateral knee pain and swelling, fell EXAM: RIGHT KNEE - 1-2 VIEW; LEFT KNEE - 1-2 VIEW COMPARISON:  03/07/2015 FINDINGS: Left knee: Frontal and lateral views are obtained. The bones are severely osteopenic. No acute fracture, subluxation, or dislocation. There is severe 3 compartmental osteoarthritis greatest in the patellofemoral compartment. No joint effusion. Diffuse atherosclerosis. Right knee: Frontal and lateral views are obtained. Bones are severely osteopenic. No fracture, subluxation, or dislocation. Severe 3 compartmental osteoarthritis greatest in the medial and lateral compartments. Trace joint effusion. Diffuse atherosclerosis. IMPRESSION: 1. No acute displaced fractures. 2. Severe bilateral 3 compartmental osteoarthritis. 3. Severe osteopenia. Electronically Signed   By: Randa Ngo M.D.   On: 03/20/2021 16:16  CT HEAD WO CONTRAST (5MM)  Result Date: 03/25/2021 CLINICAL DATA:  Delirium. EXAM: CT HEAD WITHOUT CONTRAST TECHNIQUE: Contiguous axial images were obtained from the base of the skull through the vertex without intravenous contrast. COMPARISON:  CT head 11/15/2019 FINDINGS: Brain: No evidence of acute infarction, hemorrhage, hydrocephalus, extra-axial collection or mass lesion/mass effect. 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 Generalized parenchymal volume loss with associated enlargement of the ventricles. Hypodensities in the periventricular and subcortical white matter, consistent with chronic small-vessel ischemic changes. Vascular: No  hyperdense vessel or unexpected calcification. Skull: Normal. Negative for fracture or focal lesion. Sinuses/Orbits: No acute finding. Trace mucosal thickening in the maxillary sinuses. Other: None. IMPRESSION: No acute intracranial abnormality. Electronically Signed   By: Ileana Roup M.D.   On: 03/25/2021 12:56   MR BRAIN WO CONTRAST  Result Date: 03/25/2021 CLINICAL DATA:  Delirium EXAM: MRI HEAD WITHOUT CONTRAST TECHNIQUE: Multiplanar, multiecho pulse sequences of the brain and surrounding structures were obtained without intravenous contrast. COMPARISON:  09/08/2018, correlation is also made with CT head 03/25/2021 FINDINGS: Brain: No restricted diffusion to suggest acute or subacute infarct. No hemorrhage, hydrocephalus, extra-axial collection, or mass lesion. Generalized parenchymal volume loss, with ex vacuo dilatation of the ventricles which appears largely unchanged compared to 2020. 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 T2 hyperintense signal in the periventricular white matter, likely the sequela of moderate chronic small vessel ischemic disease. Vascular: Normal flow voids. Skull and upper cervical spine: Normal marrow signal. Sinuses/Orbits: Mucosal thickening in the maxillary sinuses and ethmoid air cells. Status post bilateral lens replacements. Other: Trace fluid in right mastoid air cells. IMPRESSION: No acute intracranial process. Electronically Signed   By: Merilyn Baba M.D.   On: 03/25/2021 23:52   MR HEEL LEFT WO CONTRAST  Result Date: 03/20/2021 CLINICAL DATA:  Left heel ulceration for several weeks EXAM: MR OF THE LEFT HEEL WITHOUT CONTRAST TECHNIQUE: Multiplanar, multisequence MR imaging of the left calcaneus was performed. No intravenous contrast was administered. COMPARISON:  03/20/2021 FINDINGS: Bones/Joint/Cartilage There is abnormal T2 signal within the superficial aspect of the calcaneus, with overlying cortical irregularity, consistent with  osteomyelitis. There is a large overlying soft tissue ulceration. Likely reactive marrow edema within the distal tibia, fibula, and talus consistent with degenerative change. Marked joint space narrowing throughout the hindfoot and ankle. Trace tibiotalar joint effusion. Ligaments No acute abnormalities. Muscles and Tendons No acute abnormalities. Soft tissues Soft tissue ulceration overlying the calcaneus measuring approximately 1.6 x 2.0 cm in size, extending to the posterior calcaneal cortex. No fluid collection or abscess. There is diffuse subcutaneous edema, most pronounced along the dorsum of the midfoot. IMPRESSION: 1. Large soft tissue ulceration overlying the calcaneus, with underlying calcaneal cortical irregularity and marrow edema consistent with osteomyelitis. 2. Extensive degenerative changes throughout the ankle and hindfoot. 3. Diffuse soft tissue edema.  No fluid collection or abscess. Electronically Signed   By: Randa Ngo M.D.   On: 03/20/2021 23:31   PERIPHERAL VASCULAR CATHETERIZATION  Result Date: 03/21/2021 See surgical note for result.  DG Foot 2 Views Left  Result Date: 03/20/2021 CLINICAL DATA:  Open wound EXAM: LEFT FOOT - 2 VIEW COMPARISON:  None. FINDINGS: Ulcer noted overlying the calcaneus in the posterior heel soft tissues. No underlying bone destruction to suggest osteomyelitis. Diffuse osteopenia. No acute bony abnormality. Specifically, no fracture, subluxation, or dislocation. Calcification in  the distal Achilles tendon region, likely related to old injury. Plantar calcaneal spur. Vascular calcifications noted. IMPRESSION: No acute bony abnormality. No evidence of osteomyelitis by plain films. Electronically Signed   By: Rolm Baptise M.D.   On: 03/20/2021 17:23   EEG adult  Result Date: 03/27/2021 Derek Jack, MD     03/29/2021  1:10 PM Routine EEG Report JASNEET SCHOBERT is a 75 y.o. female with a history of encephalopathy who is undergoing an EEG to  evaluate for seizures. Report: This EEG was acquired with electrodes placed according to the International 10-20 electrode system (including Fp1, Fp2, F3, F4, C3, C4, P3, P4, O1, O2, T3, T4, T5, T6, A1, A2, Fz, Cz, Pz). The following electrodes were missing or displaced: none. The occipital dominant rhythm was 6 Hz. This activity is reactive to stimulation. Drowsiness was manifested by background fragmentation; deeper stages of sleep were not identified. There was no focal slowing. There are frequent broad generalized discharges with triphasic morphology, at times periodic up to 1 Hz, that do not appear epileptiform. There are no definitive interictal epileptiform discharges. There were no electrographic seizures identified. There was no abnormal response to photic stimulation. Hyperventilation was not performed. Impression and clinical correlation: This EEG was obtained while awake and drowsy and is abnormal due: - Moderate diffuse slowing - Triphasic waves indicating metabolic encephalopathy that do not appear epileptiform EEG significantly improved since yesterday with no activity concerning for nonconvulsive seizure. Su Monks, MD [facility] [phone] If 7pm- 7am, please page neurology on call as listed in [place].   EEG adult  Result Date: 03/26/2021 Derek Jack, MD     03/26/2021  9:06 PM Routine EEG Report MARTAVIA TYE is a 75 y.o. female with a history of encephalopathy who is undergoing an EEG to evaluate for seizures. Report: This EEG was acquired with electrodes placed according to the International 10-20 electrode system (including Fp1, Fp2, F3, F4, C3, C4, P3, P4, O1, O2, T3, T4, T5, T6, A1, A2, Fz, Cz, Pz). The following electrodes were missing or displaced: none. The best background was 4-5 Hz. This activity is reactive to stimulation. No sleep architecture was identified. There was no focal slowing. There are continuous triphasic waves throughout the recording, initially  at 1 Hz and later in the recording 2-2.5 Hz. The discharges are highly rhythmic and some runs appear to have subtle evolution and be followed by a 1-2 second period of diffuse relative suppression. Photic stimulation and hyperventilation were not performed. Impression and clinical correlation: This EEG was obtained while obtunded and is abnormal due to: - Moderate diffuse slowing indicative of global cerebral dysfunction - Continuous triphasic waves, at times up to 2-2.5 Hz, at times in runs displaying subtle evolution and followed by a 1-2 second period of relative suppression. This pattern falls on the ictal-interictal continuum and is concerning for nonconvulsive seizure activity. Su Monks, MD [facility] [phone] If 7pm- 7am, please page neurology on call as listed in [place].    Microbiology: Recent Results (from the past 240 hour(s))  Resp Panel by RT-PCR (Flu A&B, Covid) Nasopharyngeal Swab     Status: None   Collection Time: 03/20/21  5:07 PM   Specimen: Nasopharyngeal Swab; Nasopharyngeal(NP) swabs in vial transport medium  Result Value Ref Range Status   SARS Coronavirus 2 by RT PCR NEGATIVE NEGATIVE Final    Comment: (NOTE) SARS-CoV-2 target nucleic acids are NOT DETECTED.  The SARS-CoV-2 RNA is generally detectable in upper respiratory specimens  during the acute phase of infection. The lowest concentration of SARS-CoV-2 viral copies this assay can detect is 138 copies/mL. A negative result does not preclude SARS-Cov-2 infection and should not be used as the sole basis for treatment or other patient management decisions. A negative result may occur with  improper specimen collection/handling, submission of specimen other than nasopharyngeal swab, presence of viral mutation(s) within the areas targeted by this assay, and inadequate number of viral copies(<138 copies/mL). A negative result must be combined with clinical observations, patient history, and  epidemiological information. The expected result is Negative.  Fact Sheet for Patients:  EntrepreneurPulse.com.au  Fact Sheet for Healthcare Providers:  IncredibleEmployment.be  This test is no t yet approved or cleared by the [place] FDA and  has been authorized for detection and/or diagnosis of SARS-CoV-2 by FDA under an Emergency Use Authorization (EUA). This EUA will remain  in effect (meaning this test can be used) for the duration of the COVID-19 declaration under Section 564(b)(1) of the Act, 21 U.S.C.section 360bbb-3(b)(1), unless the authorization is terminated  or revoked sooner.       Influenza A by PCR NEGATIVE NEGATIVE Final   Influenza B by PCR NEGATIVE NEGATIVE Final    Comment: (NOTE) The Xpert Xpress SARS-CoV-2/FLU/RSV plus assay is intended as an aid in the diagnosis of influenza from Nasopharyngeal swab specimens and should not be used as a sole basis for treatment. Nasal washings and aspirates are unacceptable for Xpert Xpress SARS-CoV-2/FLU/RSV testing.  Fact Sheet for Patients: EntrepreneurPulse.com.au  Fact Sheet for Healthcare Providers: IncredibleEmployment.be  This test is not yet approved or cleared by the [place] FDA and has been authorized for detection and/or diagnosis of SARS-CoV-2 by FDA under an Emergency Use Authorization (EUA). This EUA will remain in effect (meaning this test can be used) for the duration of the COVID-19 declaration under Section 564(b)(1) of the Act, 21 U.S.C. section 360bbb-3(b)(1), unless the authorization is terminated or revoked.  Performed at [facility], [address] [place], [place] [address]   MRSA Next Gen by PCR, Nasal     Status: Abnormal   Collection Time: 03/21/21  7:50 AM   Specimen: Nasal Mucosa; Nasal Swab  Result Value Ref Range Status   MRSA by PCR Next Gen DETECTED (A) NOT DETECTED Final    Comment:  RESULT CALLED TO, READ BACK BY AND VERIFIED WITH: SANDY RODRIGUEZ 03/21/21 0954 KLW (NOTE) The GeneXpert MRSA Assay (FDA approved for NASAL specimens only), is one component of a comprehensive MRSA colonization surveillance program. It is not intended to diagnose MRSA infection nor to guide or monitor treatment for MRSA infections. Test performance is not FDA approved in patients less than 71 years old. Performed at [facility], [place], [place] [address]   Aerobic/Anaerobic Culture w Gram Stain (surgical/deep wound)     Status: None   Collection Time: 03/22/21  5:07 PM   Specimen: Biopsy  Result Value Ref Range Status   Specimen Description BIOPSY BONE LEFT HEEL  Final   Special Requests NONE  Final   Gram Stain   Final    NO SQUAMOUS EPITHELIAL CELLS SEEN FEW WBC SEEN NO ORGANISMS SEEN    Culture   Final    RARE METHICILLIN RESISTANT STAPHYLOCOCCUS AUREUS RARE ENTEROCOCCUS FAECALIS NO ANAEROBES ISOLATED Performed at [facility], [place] [address] [place], [place] [address]    Report Status 03/27/2021 FINAL  Final   Organism ID, Bacteria METHICILLIN RESISTANT STAPHYLOCOCCUS  AUREUS  Final   Organism ID, Bacteria ENTEROCOCCUS FAECALIS  Final      Susceptibility   Enterococcus faecalis - MIC*    AMPICILLIN <=2 SENSITIVE Sensitive     VANCOMYCIN 1 SENSITIVE Sensitive     GENTAMICIN SYNERGY RESISTANT Resistant     * RARE ENTEROCOCCUS FAECALIS   Methicillin resistant staphylococcus aureus - MIC*    CIPROFLOXACIN >=8 RESISTANT Resistant     ERYTHROMYCIN >=8 RESISTANT Resistant     GENTAMICIN <=0.5 SENSITIVE Sensitive     OXACILLIN >=4 RESISTANT Resistant     TETRACYCLINE <=1 SENSITIVE Sensitive     VANCOMYCIN <=0.5 SENSITIVE Sensitive     TRIMETH/SULFA >=320 RESISTANT Resistant     CLINDAMYCIN <=0.25 SENSITIVE Sensitive     RIFAMPIN <=0.5 SENSITIVE Sensitive     Inducible Clindamycin NEGATIVE Sensitive     * RARE METHICILLIN RESISTANT  STAPHYLOCOCCUS AUREUS     Labs: Basic Metabolic Panel: Recent Labs  Lab 03/26/21 0411 03/26/21 1835 03/27/21 0500 03/28/21 0847 03/29/21 0330 03/30/21 0418  NA 142 140 144 140 139 138  K 4.1 3.6 3.5 3.0* 3.2* 4.3  CL 118* 113* 111 108 111 111  CO2 14* 17* 22 26 25 23   GLUCOSE 141* 135* 85 140* 125* 108*  BUN 38* 36* 34* 27* 22 24*  CREATININE 2.03* 2.12* 2.11* 1.91* 1.72* 1.80*  CALCIUM 8.9 9.0 9.1 8.6* 8.5* 8.6*  MG 2.0  --  2.0 1.9  --   --   PHOS 4.7*  --  3.5 2.8  --   --    Liver Function Tests: Recent Labs  Lab 03/25/21 0416 03/26/21 0411 03/27/21 0500 03/28/21 0847 03/29/21 0330  AST 12* 12* 14* 15 18  ALT 7 5 6 7 7   ALKPHOS 66 66 60 59 56  BILITOT 1.0 1.4* 1.0 0.9 0.5  PROT 7.8 7.4 7.5 7.4 7.1  ALBUMIN 2.6* 2.4* 2.6* 2.4* 2.3*   No results for input(s): LIPASE, AMYLASE in the last 168 hours. Recent Labs  Lab 03/25/21 1416  AMMONIA 13   CBC: Recent Labs  Lab 03/25/21 0416 03/26/21 0411 03/27/21 0500 03/28/21 0847 03/29/21 0330  WBC 9.0 9.2 8.5 7.5 8.4  NEUTROABS  --  6.0 4.8 3.9 3.9  HGB 9.5* 8.4* 9.5* 9.6* 8.5*  HCT 29.8* 26.7* 29.4* 29.1* 26.6*  MCV 92.5 93.7 92.2 91.5 92.0  PLT 212 193 201 200 202   Cardiac Enzymes: Recent Labs  Lab 03/27/21 0500  CKTOTAL 35*   BNP: BNP (last 3 results) No results for input(s): BNP in the last 8760 hours.  ProBNP (last 3 results) No results for input(s): PROBNP in the last 8760 hours.  CBG: Recent Labs  Lab 03/29/21 1718 03/29/21 2125 03/30/21 0054 03/30/21 0445 03/30/21 0806  GLUCAP 120* 152* 110* 102* 110*       Signed:  Nita Sells MD   [facility] 03/30/2021, 12:16 PM

## 2021-03-30 NOTE — Evaluation (Signed)
Physical Therapy Evaluation Patient Details Name: [name] [name] MRN: [id number] DOB: 02/20/1946 Today's Date: 03/30/2021  History of Present Illness  [name] [name] is a 75 y.o. female admitted on 03/20/2021 with  DM foot infection/Osteomyelitis.  Patient is s/p irrigation and debridement and bone biopsy of left foot wound on 12/15.  Vascular consulted for BKA but patient unwilling to proceed at this point.    Clinical Impression  PT order received, reviewed, and therapist attempted to see pt.  Pt upon entering states that she is at her baseline level of function and does not need PT at this time.  Pt has been bed-bound according to caregiver in room and has not ambulated in years.  Pt did need assistance with positioning in bed.  Nursing assisted with mobility in the bed to sit up more for medication.  Pt not able to assist much at this time.  According to case management, no HHPT is being requested at this time and all needs are met for d/c home at this time.  Patient is at baseline, all education completed, and time is given to address all questions/concerns. No additional skilled PT services needed at this time, PT signing off. PT recommends daily mobility ad lib or with nursing staff as needed to prevent deconditioning.         Recommendations for follow up therapy are one component of a multi-disciplinary discharge planning process, led by the attending physician.  Recommendations may be updated based on patient status, additional functional criteria and insurance authorization.  Follow Up Recommendations No PT follow up    Assistance Recommended at Discharge Intermittent Supervision/Assistance  Functional Status Assessment Patient has had a recent decline in their functional status and demonstrates the ability to make significant improvements in function in a reasonable and predictable amount of time.  Equipment Recommendations  None recommended by PT    Recommendations for Other  Services       Precautions / Restrictions Restrictions Weight Bearing Restrictions: Yes LLE Weight Bearing: Weight bearing as tolerated      Mobility  Bed Mobility               General bed mobility comments: pt bedbound at baseline.    Transfers                        Ambulation/Gait                  Stairs            Wheelchair Mobility    Modified Rankin (Stroke Patients Only)       Balance                                             Pertinent Vitals/Pain Pain Assessment: No/denies pain    Home Living Family/patient expects to be discharged to:: Private residence Living Arrangements: Children Available Help at Discharge: Family;Available 24 hours/day Type of Home: House Home Access: Stairs to enter Entrance Stairs-Rails: Left Entrance Stairs-Number of Steps: 4   Home Layout: One level Home Equipment: Conservation officer, nature (2 wheels);Cane - single point;Wheelchair - manual Additional Comments: Pulled from previous visit to hospital d/t pt not being able to provide all information.    Prior Function  Hand Dominance   Dominant Hand: Left    Extremity/Trunk Assessment                Communication   Communication: No difficulties  Cognition Arousal/Alertness: Awake/alert Behavior During Therapy: WFL for tasks assessed/performed Overall Cognitive Status: Difficult to assess                                 General Comments: Pt responded to questioning, however difficult to understand at times.        General Comments      Exercises Other Exercises Other Exercises: Pt educated on PT services provided during hospital stay and d/c options.  Pt is currently at baseline levels and is not requesting HHPT at this time.   Assessment/Plan    PT Assessment Patient does not need any further PT services  PT Problem List         PT Treatment Interventions       PT Goals (Current goals can be found in the Care Plan section)  Acute Rehab PT Goals Patient Stated Goal: to go home. PT Goal Formulation: With patient Time For Goal Achievement: 04/13/21 Potential to Achieve Goals: Good    Frequency     Barriers to discharge        Co-evaluation               AM-PAC PT "6 Clicks" Mobility  Outcome Measure Help needed turning from your back to your side while in a flat bed without using bedrails?: Total Help needed moving from lying on your back to sitting on the side of a flat bed without using bedrails?: Total Help needed moving to and from a bed to a chair (including a wheelchair)?: Total Help needed standing up from a chair using your arms (e.g., wheelchair or bedside chair)?: Total Help needed to walk in hospital room?: Total Help needed climbing 3-5 steps with a railing? : Total 6 Click Score: 6    End of Session     Patient left: in bed;with call bell/phone within reach;with bed alarm set;with nursing/sitter in room Nurse Communication: Mobility status PT Visit Diagnosis: Muscle weakness (generalized) (M62.81)    Time: 5974-7185 PT Time Calculation (min) (ACUTE ONLY): 15 min   Charges:   PT Evaluation $PT Eval Low Complexity: 1 Low          Gwenlyn Saran, PT, DPT 03/30/21, 1:18 PM   Christie Nottingham 03/30/2021, 1:15 PM

## 2021-03-31 LAB — GLUCOSE, CAPILLARY
Glucose-Capillary: 143 mg/dL — ABNORMAL HIGH (ref 70–99)
Glucose-Capillary: 155 mg/dL — ABNORMAL HIGH (ref 70–99)
Glucose-Capillary: 144 mg/dL — ABNORMAL HIGH (ref 70–99)
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
## 2021-04-01 LAB — METHYLMALONIC ACID, SERUM: Methylmalonic Acid, Quantitative: 569 nmol/L — ABNORMAL HIGH (ref 0–378)

## 2021-04-11 NOTE — Anesthesia Postprocedure Evaluation (Signed)
Anesthesia Post Note  Patient: [name] [name]  Procedure(s) Performed: IRRIGATION AND DEBRIDEMENT FOOT AND BONE BIOPSY (Left)  Patient location during evaluation: PACU Anesthesia Type: General Level of consciousness: awake and alert Pain management: pain level controlled Vital Signs Assessment: post-procedure vital signs reviewed and stable Respiratory status: spontaneous breathing, nonlabored ventilation, respiratory function stable and patient connected to nasal cannula oxygen Cardiovascular status: blood pressure returned to baseline and stable Postop Assessment: no apparent nausea or vomiting Anesthetic complications: no   No notable events documented.   Last Vitals:  Vitals:   03/31/21 0417 03/31/21 0746  BP: 123/61 (!) 153/78  Pulse: 88 84  Resp: 17 18  Temp: 36.9 C 37.1 C  SpO2: 100% 98%    Last Pain:  Vitals:   03/30/21 2049  TempSrc:   PainSc: 0-No pain                 Molli Barrows

## 2021-04-26 ENCOUNTER — Ambulatory Visit: Payer: Medicare Other | Attending: Infectious Diseases | Admitting: Infectious Diseases

## 2021-04-26 ENCOUNTER — Other Ambulatory Visit: Payer: Self-pay

## 2021-04-26 DIAGNOSIS — E039 Hypothyroidism, unspecified: Secondary | ICD-10-CM

## 2021-04-26 DIAGNOSIS — Z79890 Hormone replacement therapy (postmenopausal): Secondary | ICD-10-CM | POA: Diagnosis not present

## 2021-04-26 DIAGNOSIS — Z792 Long term (current) use of antibiotics: Secondary | ICD-10-CM | POA: Insufficient documentation

## 2021-04-26 DIAGNOSIS — E1122 Type 2 diabetes mellitus with diabetic chronic kidney disease: Secondary | ICD-10-CM

## 2021-04-26 DIAGNOSIS — D649 Anemia, unspecified: Secondary | ICD-10-CM | POA: Diagnosis not present

## 2021-04-26 DIAGNOSIS — B9562 Methicillin resistant Staphylococcus aureus infection as the cause of diseases classified elsewhere: Secondary | ICD-10-CM | POA: Diagnosis not present

## 2021-04-26 DIAGNOSIS — Z794 Long term (current) use of insulin: Secondary | ICD-10-CM | POA: Diagnosis not present

## 2021-04-26 DIAGNOSIS — M86672 Other chronic osteomyelitis, left ankle and foot: Secondary | ICD-10-CM | POA: Diagnosis not present

## 2021-04-26 DIAGNOSIS — B952 Enterococcus as the cause of diseases classified elsewhere: Secondary | ICD-10-CM | POA: Insufficient documentation

## 2021-04-26 DIAGNOSIS — M25561 Pain in right knee: Secondary | ICD-10-CM | POA: Insufficient documentation

## 2021-04-26 DIAGNOSIS — L97429 Non-pressure chronic ulcer of left heel and midfoot with unspecified severity: Secondary | ICD-10-CM | POA: Insufficient documentation

## 2021-04-26 DIAGNOSIS — L97526 Non-pressure chronic ulcer of other part of left foot with bone involvement without evidence of necrosis: Secondary | ICD-10-CM

## 2021-04-26 DIAGNOSIS — N189 Chronic kidney disease, unspecified: Secondary | ICD-10-CM

## 2021-04-26 DIAGNOSIS — B957 Other staphylococcus as the cause of diseases classified elsewhere: Secondary | ICD-10-CM | POA: Diagnosis not present

## 2021-04-26 DIAGNOSIS — N179 Acute kidney failure, unspecified: Secondary | ICD-10-CM | POA: Diagnosis not present

## 2021-04-26 DIAGNOSIS — E11621 Type 2 diabetes mellitus with foot ulcer: Secondary | ICD-10-CM | POA: Insufficient documentation

## 2021-04-26 NOTE — Progress Notes (Signed)
The purpose of this virtual visit is to provide medical care while limiting exposure to the novel coronavirus (COVID19) for both patient and office staff.   Consent was obtained for phone visit:  Yes.   Answered questions that patient had about telehealth interaction:  Yes.   I discussed the limitations, risks, security and privacy concerns of performing an evaluation and management service by telephone. I also discussed with the patient that there may be a patient responsible charge related to this service. The patient expressed understanding and agreed to proceed.   Patient Location: Home Provider Location: office Persons on the televisit- son, patient, provider, CMA  Follow up visit after recent hospitalization Was in [facility] between 12/13-12/23/22 for left heel ulcer and chronic osteo Underwent debridement.  Bone biopsy showed acute on chronic osteomyelitis. Bone culture had MRSA and Enterococcus faecalis Pt was on  daptomycin IV band then switched to doxy/amoxcillin for 4 weeks on discharge to complete 6 weeks of antibiotic Pt has not been able to go to the wound clinic. 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     AKI on CKD  Anemia  Hypothyroidism.  on synthroid-   Pt's son will let us know after the house call doc sees the patient Total time spent on this video call 14 min

## 2021-05-02 ENCOUNTER — Emergency Department: Payer: Medicare Other

## 2021-05-02 ENCOUNTER — Telehealth: Payer: Self-pay

## 2021-05-02 ENCOUNTER — Inpatient Hospital Stay: Payer: Medicare Other

## 2021-05-02 ENCOUNTER — Inpatient Hospital Stay
Admission: EM | Admit: 2021-05-02 | Discharge: 2021-05-10 | DRG: 637 | Disposition: A | Discharge status: Hospice | Payer: Medicare Other | Attending: Internal Medicine | Admitting: Internal Medicine

## 2021-05-02 ENCOUNTER — Other Ambulatory Visit: Payer: Self-pay

## 2021-05-02 DIAGNOSIS — N2581 Secondary hyperparathyroidism of renal origin: Secondary | ICD-10-CM | POA: Diagnosis present

## 2021-05-02 DIAGNOSIS — N170 Acute kidney failure with tubular necrosis: Secondary | ICD-10-CM | POA: Diagnosis not present

## 2021-05-02 DIAGNOSIS — E1169 Type 2 diabetes mellitus with other specified complication: Principal | ICD-10-CM | POA: Diagnosis present

## 2021-05-02 DIAGNOSIS — R0902 Hypoxemia: Secondary | ICD-10-CM | POA: Diagnosis present

## 2021-05-02 DIAGNOSIS — Z515 Encounter for palliative care: Secondary | ICD-10-CM

## 2021-05-02 DIAGNOSIS — R410 Disorientation, unspecified: Secondary | ICD-10-CM | POA: Diagnosis not present

## 2021-05-02 DIAGNOSIS — E440 Moderate protein-calorie malnutrition: Secondary | ICD-10-CM | POA: Diagnosis present

## 2021-05-02 DIAGNOSIS — Z66 Do not resuscitate: Secondary | ICD-10-CM | POA: Diagnosis present

## 2021-05-02 DIAGNOSIS — M86372 Chronic multifocal osteomyelitis, left ankle and foot: Secondary | ICD-10-CM | POA: Diagnosis not present

## 2021-05-02 DIAGNOSIS — I5032 Chronic diastolic (congestive) heart failure: Secondary | ICD-10-CM | POA: Diagnosis present

## 2021-05-02 DIAGNOSIS — D519 Vitamin B12 deficiency anemia, unspecified: Secondary | ICD-10-CM

## 2021-05-02 DIAGNOSIS — J9601 Acute respiratory failure with hypoxia: Secondary | ICD-10-CM

## 2021-05-02 DIAGNOSIS — R5381 Other malaise: Secondary | ICD-10-CM

## 2021-05-02 DIAGNOSIS — Z794 Long term (current) use of insulin: Secondary | ICD-10-CM

## 2021-05-02 DIAGNOSIS — L89512 Pressure ulcer of right ankle, stage 2: Secondary | ICD-10-CM | POA: Diagnosis present

## 2021-05-02 DIAGNOSIS — N184 Chronic kidney disease, stage 4 (severe): Secondary | ICD-10-CM | POA: Diagnosis not present

## 2021-05-02 DIAGNOSIS — L97512 Non-pressure chronic ulcer of other part of right foot with fat layer exposed: Secondary | ICD-10-CM | POA: Diagnosis present

## 2021-05-02 DIAGNOSIS — R627 Adult failure to thrive: Secondary | ICD-10-CM

## 2021-05-02 DIAGNOSIS — I130 Hypertensive heart and chronic kidney disease with heart failure and stage 1 through stage 4 chronic kidney disease, or unspecified chronic kidney disease: Secondary | ICD-10-CM | POA: Diagnosis present

## 2021-05-02 DIAGNOSIS — Z89421 Acquired absence of other right toe(s): Secondary | ICD-10-CM

## 2021-05-02 DIAGNOSIS — E11610 Type 2 diabetes mellitus with diabetic neuropathic arthropathy: Secondary | ICD-10-CM | POA: Diagnosis present

## 2021-05-02 DIAGNOSIS — L97412 Non-pressure chronic ulcer of right heel and midfoot with fat layer exposed: Secondary | ICD-10-CM

## 2021-05-02 DIAGNOSIS — I509 Heart failure, unspecified: Secondary | ICD-10-CM

## 2021-05-02 DIAGNOSIS — E538 Deficiency of other specified B group vitamins: Secondary | ICD-10-CM | POA: Diagnosis present

## 2021-05-02 DIAGNOSIS — Z7189 Other specified counseling: Secondary | ICD-10-CM | POA: Diagnosis not present

## 2021-05-02 DIAGNOSIS — I10 Essential (primary) hypertension: Secondary | ICD-10-CM | POA: Diagnosis not present

## 2021-05-02 DIAGNOSIS — Z7982 Long term (current) use of aspirin: Secondary | ICD-10-CM

## 2021-05-02 DIAGNOSIS — E11621 Type 2 diabetes mellitus with foot ulcer: Secondary | ICD-10-CM | POA: Diagnosis present

## 2021-05-02 DIAGNOSIS — L89620 Pressure ulcer of left heel, unstageable: Secondary | ICD-10-CM | POA: Diagnosis present

## 2021-05-02 DIAGNOSIS — N183 Chronic kidney disease, stage 3 unspecified: Secondary | ICD-10-CM | POA: Diagnosis present

## 2021-05-02 DIAGNOSIS — M869 Osteomyelitis, unspecified: Secondary | ICD-10-CM

## 2021-05-02 DIAGNOSIS — E785 Hyperlipidemia, unspecified: Secondary | ICD-10-CM | POA: Diagnosis present

## 2021-05-02 DIAGNOSIS — Z681 Body mass index (BMI) 19 or less, adult: Secondary | ICD-10-CM

## 2021-05-02 DIAGNOSIS — M86672 Other chronic osteomyelitis, left ankle and foot: Secondary | ICD-10-CM | POA: Diagnosis present

## 2021-05-02 DIAGNOSIS — D631 Anemia in chronic kidney disease: Secondary | ICD-10-CM | POA: Diagnosis present

## 2021-05-02 DIAGNOSIS — U071 COVID-19: Secondary | ICD-10-CM | POA: Diagnosis present

## 2021-05-02 DIAGNOSIS — I959 Hypotension, unspecified: Secondary | ICD-10-CM | POA: Diagnosis not present

## 2021-05-02 DIAGNOSIS — E1122 Type 2 diabetes mellitus with diabetic chronic kidney disease: Secondary | ICD-10-CM | POA: Diagnosis present

## 2021-05-02 DIAGNOSIS — E872 Acidosis, unspecified: Secondary | ICD-10-CM | POA: Diagnosis not present

## 2021-05-02 DIAGNOSIS — F0390 Unspecified dementia without behavioral disturbance: Secondary | ICD-10-CM | POA: Diagnosis present

## 2021-05-02 DIAGNOSIS — N1832 Chronic kidney disease, stage 3b: Secondary | ICD-10-CM | POA: Diagnosis present

## 2021-05-02 DIAGNOSIS — E039 Hypothyroidism, unspecified: Secondary | ICD-10-CM | POA: Diagnosis present

## 2021-05-02 DIAGNOSIS — Z79890 Hormone replacement therapy (postmenopausal): Secondary | ICD-10-CM

## 2021-05-02 DIAGNOSIS — Z79899 Other long term (current) drug therapy: Secondary | ICD-10-CM

## 2021-05-02 DIAGNOSIS — G9341 Metabolic encephalopathy: Secondary | ICD-10-CM | POA: Diagnosis present

## 2021-05-02 DIAGNOSIS — L89150 Pressure ulcer of sacral region, unstageable: Secondary | ICD-10-CM | POA: Diagnosis present

## 2021-05-02 LAB — CBC WITH DIFFERENTIAL/PLATELET
Abs Immature Granulocytes: 0.05 10*3/uL (ref 0.00–0.07)
Basophils Absolute: 0 10*3/uL (ref 0.0–0.1)
Basophils Relative: 0 %
Eosinophils Absolute: 0 10*3/uL (ref 0.0–0.5)
Eosinophils Relative: 0 %
HCT: 32.6 % — ABNORMAL LOW (ref 36.0–46.0)
Hemoglobin: 10.1 g/dL — ABNORMAL LOW (ref 12.0–15.0)
Immature Granulocytes: 0 %
Lymphocytes Relative: 17 %
Lymphs Abs: 2.1 10*3/uL (ref 0.7–4.0)
MCH: 29 pg (ref 26.0–34.0)
MCHC: 31 g/dL (ref 30.0–36.0)
MCV: 93.7 fL (ref 80.0–100.0)
Monocytes Absolute: 0.9 10*3/uL (ref 0.1–1.0)
Monocytes Relative: 7 %
Neutro Abs: 9.2 10*3/uL — ABNORMAL HIGH (ref 1.7–7.7)
Neutrophils Relative %: 76 %
Platelets: 277 10*3/uL (ref 150–400)
RBC: 3.48 MIL/uL — ABNORMAL LOW (ref 3.87–5.11)
RDW: 15.9 % — ABNORMAL HIGH (ref 11.5–15.5)
WBC: 12.2 10*3/uL — ABNORMAL HIGH (ref 4.0–10.5)
nRBC: 0 % (ref 0.0–0.2)

## 2021-05-02 LAB — COMPREHENSIVE METABOLIC PANEL
ALT: 32 U/L (ref 0–44)
AST: 35 U/L (ref 15–41)
Albumin: 2.7 g/dL — ABNORMAL LOW (ref 3.5–5.0)
Alkaline Phosphatase: 75 U/L (ref 38–126)
Anion gap: 13 (ref 5–15)
BUN: 105 mg/dL — ABNORMAL HIGH (ref 8–23)
CO2: 14 mmol/L — ABNORMAL LOW (ref 22–32)
Calcium: 10.4 mg/dL — ABNORMAL HIGH (ref 8.9–10.3)
Chloride: 115 mmol/L — ABNORMAL HIGH (ref 98–111)
Creatinine, Ser: 1.87 mg/dL — ABNORMAL HIGH (ref 0.44–1.00)
GFR, Estimated: 28 mL/min — ABNORMAL LOW (ref >60)
Glucose, Bld: 127 mg/dL — ABNORMAL HIGH (ref 70–99)
Potassium: 4.7 mmol/L (ref 3.5–5.1)
Sodium: 142 mmol/L (ref 135–145)
Total Bilirubin: 0.6 mg/dL (ref 0.3–1.2)
Total Protein: 8.5 g/dL — ABNORMAL HIGH (ref 6.5–8.1)

## 2021-05-02 LAB — URINALYSIS, ROUTINE W REFLEX MICROSCOPIC
Bilirubin Urine: NEGATIVE
Glucose, UA: NEGATIVE mg/dL
Hgb urine dipstick: NEGATIVE
Leukocytes,Ua: NEGATIVE
Nitrite: NEGATIVE
Specific Gravity, Urine: <1.005 — ABNORMAL LOW (ref 1.005–1.030)
pH: 5 (ref 5.0–8.0)

## 2021-05-02 LAB — URINALYSIS, MICROSCOPIC (REFLEX)

## 2021-05-02 LAB — RESP PANEL BY RT-PCR (FLU A&B, COVID) ARPGX2
Influenza A by PCR: NEGATIVE
Influenza B by PCR: NEGATIVE
SARS Coronavirus 2 by RT PCR: POSITIVE — AB

## 2021-05-02 LAB — SEDIMENTATION RATE: Sed Rate: 81 mm/hr — ABNORMAL HIGH (ref 0–30)

## 2021-05-02 LAB — GLUCOSE, CAPILLARY: Glucose-Capillary: 123 mg/dL — ABNORMAL HIGH (ref 70–99)

## 2021-05-02 LAB — TROPONIN I (HIGH SENSITIVITY)
Troponin I (High Sensitivity): 16 ng/L (ref <18)
Troponin I (High Sensitivity): 15 ng/L (ref <18)

## 2021-05-02 LAB — LACTIC ACID, PLASMA: Lactic Acid, Venous: 1.7 mmol/L (ref 0.5–1.9)

## 2021-05-02 MED ORDER — ACETAMINOPHEN 325 MG PO TABS: 650.0000 mg | ORAL_TABLET | Freq: Four times a day (QID) | ORAL | Status: DC | PRN

## 2021-05-02 MED ORDER — ONDANSETRON HCL 4 MG PO TABS: 4.0000 mg | ORAL_TABLET | Freq: Four times a day (QID) | ORAL | Status: DC | PRN

## 2021-05-02 MED ORDER — VITAMIN B-12 1000 MCG PO TABS
1000.0000 ug | ORAL_TABLET | Freq: Every day | ORAL | Status: DC
  Administered 2021-05-03 – 2021-05-08 (×6): 1000 ug via ORAL
  Filled 2021-05-02 (×8): qty 1

## 2021-05-02 MED ORDER — ENOXAPARIN SODIUM 30 MG/0.3ML IJ SOSY
30.0000 mg | PREFILLED_SYRINGE | INTRAMUSCULAR | Status: DC
  Administered 2021-05-03 – 2021-05-07 (×6): 30 mg via SUBCUTANEOUS
  Filled 2021-05-02 (×6): qty 0.3

## 2021-05-02 MED ORDER — TRAZODONE HCL 50 MG PO TABS: 25.0000 mg | ORAL_TABLET | Freq: Every evening | ORAL | Status: DC | PRN

## 2021-05-02 MED ORDER — ONDANSETRON HCL 4 MG/2ML IJ SOLN: 4.0000 mg | Freq: Four times a day (QID) | INTRAMUSCULAR | Status: DC | PRN

## 2021-05-02 MED ORDER — ADULT MULTIVITAMIN W/MINERALS CH
1.0000 | ORAL_TABLET | Freq: Every day | ORAL | Status: DC
  Administered 2021-05-03 – 2021-05-08 (×6): 1 via ORAL
  Filled 2021-05-02 (×7): qty 1

## 2021-05-02 MED ORDER — LEVOTHYROXINE SODIUM 50 MCG PO TABS
50.0000 ug | ORAL_TABLET | Freq: Every day | ORAL | Status: DC
  Administered 2021-05-04 – 2021-05-08 (×4): 50 ug via ORAL
  Filled 2021-05-02 (×5): qty 1

## 2021-05-02 MED ORDER — POLYETHYLENE GLYCOL 3350 17 G PO PACK
17.0000 g | PACK | Freq: Every day | ORAL | Status: DC
  Administered 2021-05-03 – 2021-05-06 (×4): 17 g via ORAL
  Filled 2021-05-02 (×6): qty 1

## 2021-05-02 MED ORDER — DONEPEZIL HCL 5 MG PO TABS
10.0000 mg | ORAL_TABLET | Freq: Every day | ORAL | Status: DC
  Administered 2021-05-03 – 2021-05-07 (×5): 10 mg via ORAL
  Filled 2021-05-02 (×7): qty 2

## 2021-05-02 MED ORDER — ATORVASTATIN CALCIUM 20 MG PO TABS
40.0000 mg | ORAL_TABLET | Freq: Every day | ORAL | Status: DC
  Administered 2021-05-03 – 2021-05-07 (×5): 40 mg via ORAL
  Filled 2021-05-02 (×7): qty 2

## 2021-05-02 MED ORDER — AMLODIPINE BESYLATE 10 MG PO TABS
10.0000 mg | ORAL_TABLET | Freq: Every day | ORAL | Status: DC
  Administered 2021-05-03 – 2021-05-04 (×2): 10 mg via ORAL
  Filled 2021-05-02 (×3): qty 1

## 2021-05-02 MED ORDER — LACTATED RINGERS IV SOLN: INTRAVENOUS | Status: DC

## 2021-05-02 MED ORDER — ASPIRIN 81 MG PO CHEW
81.0000 mg | CHEWABLE_TABLET | Freq: Every day | ORAL | Status: DC
  Administered 2021-05-03 – 2021-05-08 (×6): 81 mg via ORAL
  Filled 2021-05-02 (×7): qty 1

## 2021-05-02 MED ORDER — ACETAMINOPHEN 650 MG RE SUPP: 650.0000 mg | Freq: Four times a day (QID) | RECTAL | Status: DC | PRN

## 2021-05-02 MED ORDER — POLYETHYLENE GLYCOL 3350 17 G PO PACK: 17.0000 g | PACK | Freq: Every day | ORAL | Status: DC | PRN

## 2021-05-02 NOTE — Progress Notes (Signed)
PHARMACIST - PHYSICIAN COMMUNICATION  CONCERNING:  Enoxaparin (Lovenox) for DVT Prophylaxis    RECOMMENDATION: Patient was prescribed enoxaparin 40mg  q24 hours for VTE prophylaxis.   Filed Weights   05/02/21 1117  Weight: 54.4 kg (120 lb)    Body mass index is 18.79 kg/m.  Estimated Creatinine Clearance: 22.7 mL/min (A) (by C-G formula based on SCr of 1.87 mg/dL (H)).  Patient is candidate for enoxaparin 30mg  every 24 hours based on CrCl <42ml/min or Weight <45kg  DESCRIPTION: Pharmacy has adjusted enoxaparin dose per [facility] policy.  Patient is now receiving enoxaparin 30 mg every 24 hours   Benita Gutter 05/02/2021 5:52 PM

## 2021-05-02 NOTE — Plan of Care (Signed)

## 2021-05-02 NOTE — ED Triage Notes (Signed)
Pt via [facility] from home. Medic reports home situation is not the best. Chronic wounds to foot and sacral area. Per medic family reports decline since christmas eve both neuro and physical.

## 2021-05-02 NOTE — ED Notes (Signed)
Pt fully bathed and cleaned. Pt had large sized bowel movement, pt has no rectal tone. Full linen change, pt repositioned, clean dressing applied to sacral wound, and fresh chuck pad placed under pt.

## 2021-05-02 NOTE — ED Provider Notes (Signed)
[facility] Provider Note    Event Date/Time   First MD Initiated Contact with Patient 05/02/21 1107     (approximate)   History   Weakness and Fatigue   HPI  [name] [name] is a 76 y.o. female with history of hypertension diabetes kidney disease sacral and foot ulcers who comes in from home.  Reportedly she has not been eating or drinking much for the last month and she is losing weight.  She seems to be getting worse.  Also family is worried about her heel ulcer and the decubitus ulcer on her sacrum.      Physical Exam   Triage Vital Signs: ED Triage Vitals  Enc Vitals Group     BP 05/02/21 1116 126/69     Pulse Rate 05/02/21 1116 99     Resp 05/02/21 1116 18     Temp 05/02/21 1116 97.7 F (36.5 C)     Temp Source 05/02/21 1116 Oral     SpO2 05/02/21 1116 92 %     Weight 05/02/21 1117 120 lb (54.4 kg)     Height 05/02/21 1117 5\' 7"  (1.702 m)     Head Circumference --      Peak Flow --      Pain Score 05/02/21 1117 0     Pain Loc --      Pain Edu? --      Excl. in [place]? --     Most recent vital signs: Vitals:   05/02/21 1430 05/02/21 1500  BP: 128/74 (!) 142/78  Pulse: 93 94  Resp: 18 18  Temp:    SpO2: 90% 96%     General: Awake, alert.  Thinks it is 2017.  No distress.  CV:  Good peripheral perfusion.  Heart regular rate and rhythm no audible murmurs Resp:  Normal effort.  Lungs are clear.  Patient has a very deep wet cough however and coughs frequently Abd:  No distention.  Soft.  There is an area in the left lower abdomen is quite tender and seems locally firm. Skin: There is a decubitus with some blackening of the underlying tissue over the sacrum.  Very large at least 4 or 5% body surface area.  There is also a decubitus with a black eschar on it on the left heel.  This is tender.   ED Results / Procedures / Treatments   Labs (all labs ordered are listed, but only abnormal results are displayed) Labs Reviewed   COMPREHENSIVE METABOLIC PANEL - Abnormal; Notable for the following components:      Result Value   Chloride 115 (*)    CO2 14 (*)    Glucose, Bld 127 (*)    BUN 105 (*)    Creatinine, Ser 1.87 (*)    Calcium 10.4 (*)    Total Protein 8.5 (*)    Albumin 2.7 (*)    GFR, Estimated 28 (*)    All other components within normal limits  URINALYSIS, ROUTINE W REFLEX MICROSCOPIC - Abnormal; Notable for the following components:   Specific Gravity, Urine <1.005 (*)    Ketones, ur TRACE (*)    Protein, ur TRACE (*)    All other components within normal limits  CBC WITH DIFFERENTIAL/PLATELET - Abnormal; Notable for the following components:   WBC 12.2 (*)    RBC 3.48 (*)    Hemoglobin 10.1 (*)    HCT 32.6 (*)    RDW 15.9 (*)    Neutro  Abs 9.2 (*)    All other components within normal limits  SEDIMENTATION RATE - Abnormal; Notable for the following components:   Sed Rate 81 (*)    All other components within normal limits  URINALYSIS, MICROSCOPIC (REFLEX) - Abnormal; Notable for the following components:   Bacteria, UA RARE (*)    All other components within normal limits  URINE CULTURE  LACTIC ACID, PLASMA  CBC WITH DIFFERENTIAL/PLATELET  CBC WITH DIFFERENTIAL/PLATELET  TROPONIN I (HIGH SENSITIVITY)  TROPONIN I (HIGH SENSITIVITY)     EKG EKG read and interpreted by me shows normal sinus rhythm rate of 91 normal axis frequent PACs.  There is diffuse T wave flattening and nonspecific ST changes.    RADIOLOGY  CT abdomen pelvis read by radiology reviewed by me only shows a lot of stool in the rectum. Chest x-ray read by radiology reviewed by me does not show any acute processes.  X-ray of the foot read by radiologist reviewed by me shows some haziness of the posterior part of the calcaneus which radiologist confirms is osteomyelitis.  PROCEDURES:  Critical Care performed:   Procedures   MEDICATIONS ORDERED IN ED: Medications - No data to display   IMPRESSION / MDM /  [place] / ED COURSE  I reviewed the triage vital signs and the nursing notes.  Patient's urinalysis is clear her electrolytes do not show any acute changes.  She has had a decreased GFR in the past.  Her calcium is somewhat elevated but only minimally 10.4 instead of normal 10.3 her protein is also slightly elevated.  Her troponin is negative lactic acid is negative white count is minimally elevated at 12.  Her sed rate is 81 which is quite a bit elevated and goes along with osteomyelitis.  We will plan on admitting the patient for failure to thrive and to treat her constipation and what appears to be osteomyelitis.  Patient probably could also benefit from surgical consultation for debridement of decubiti.  he patient is on the cardiac monitor to evaluate for evidence of arrhythmia and/or significant heart rate changes.  None have been seen except for the PACs  Old records were reviewed and show that she did have an irrigation and debridement of the foot in December she also had an admission for diabetic foot infection at that time.  She had a history of congestive heart failure and atherosclerosis.       FINAL CLINICAL IMPRESSION(S) / ED DIAGNOSES   Final diagnoses:  Osteomyelitis of left foot, unspecified type ([place])  Failure to thrive in adult     Rx / DC Orders   ED Discharge Orders     None        Note:  This document was prepared using Dragon voice recognition software and may include unintentional dictation errors.   Nena Polio, MD 05/02/21 [phone]

## 2021-05-02 NOTE — H&P (Signed)
[facility] History and Physical  [name] [name] TMH:[id number] DOB: January 20, 1946 DOA: 05/02/2021  Referring physician: Dr. Cinda Quest PCP: Pcp, No   Chief Complaint: weakness  HPI: [name] [name] is a 76 y.o. female with hx of hypertension, type 2 diabetes, hypothyroidism, CKD stage IV, CHF, multiple pressure ulcers, and osteomyelitis, who presents from home for weakness.  Patient recently admitted from December 13 to March 30, 2021.  She was admitted with extensive osteomyelitis of the left foot.  She underwent revascularization as well as debridement.  She also had altered mental status will started on Keppra by neurology.  Patient is a limited historian during my interview.  She reports she has been feeling unwell for the past 3 or 4 days, unable to eat or drink much of anything.  Also states she has been unable to get out of bed.  I asked regarding her ADLs, and she reports that she is independent in all of them.  I subsequently called her son [name] [name] who is her caretaker.  He reported that she is independent in all of her ADLs at baseline, she is to have some mobility within her bed but this has deteriorated significantly since her last hospitalization.  He reports she has been taking all her medications as prescribed and finished her course of antibiotics for her osteomyelitis.  In the ED initial vital signs notable for hypoxemia requiring 4 L nasal cannula.  Patient reported she does not use oxygen at baseline at home.  Lab work-up showed CBC with mildly elevated white count of 12 but otherwise at her baseline and with improved anemia.  CMP showed stable CKD, otherwise unremarkable.  UA was also unremarkable.  Lactic acid and troponin were negative.  Chest x-ray showed no evidence of acute cardiopulmonary process.  X-ray of the left foot showed ongoing findings consistent with osteomyelitis.  CT abdomen and pelvis without contrast was obtained for report of some left lower quadrant  abdominal pain, which showed no acute findings with exception of large amount of stool in the rectum.  Review of Systems:  Pertinent positives and negative per HPI, all others reviewed and negative  Past Medical History:  Diagnosis Date   Chronic diastolic CHF (congestive heart failure), NYHA class 3 ([place]) 06/30/2019   Diabetes mellitus without complication ([place])    Humerus fracture 04/20/2018   left   Hyperlipidemia    Hypertension    Hypothyroidism    Vertigo    several yrs ago   Wears dentures    partial upper and lower (loose)   Past Surgical History:  Procedure Laterality Date   AMPUTATION TOE Right 05/31/2020   Procedure: AMPUTATION TOE;  Surgeon: Caroline More, DPM;  Location: [facility];  Service: Podiatry;  Laterality: Right;   CATARACT EXTRACTION W/PHACO Left 12/01/2018   Procedure: CATARACT EXTRACTION PHACO AND INTRAOCULAR LENS PLACEMENT ([place]) LEFT DIABETIC;  Surgeon: Birder Robson, MD;  Location: [facility];  Service: Ophthalmology;  Laterality: Left;  Diabetic - insulin   COLONOSCOPY WITH PROPOFOL N/A 07/07/2019   Procedure: COLONOSCOPY WITH PROPOFOL;  Surgeon: Robert Bellow, MD;  Location: [facility];  Service: Endoscopy;  Laterality: N/A;   ESOPHAGOGASTRODUODENOSCOPY (EGD) WITH PROPOFOL N/A 07/07/2019   Procedure: ESOPHAGOGASTRODUODENOSCOPY (EGD) WITH PROPOFOL;  Surgeon: Robert Bellow, MD;  Location: [facility];  Service: Endoscopy;  Laterality: N/A;   FOOT SURGERY Right    lesion excision   IRRIGATION AND DEBRIDEMENT FOOT Left 03/22/2021   Procedure: IRRIGATION AND DEBRIDEMENT FOOT AND BONE BIOPSY;  Surgeon: Felipa Furnace, DPM;  Location: [facility];  Service: Podiatry;  Laterality: Left;   LOWER EXTREMITY ANGIOGRAPHY Right 05/19/2020   Procedure: Lower Extremity Angiography;  Surgeon: Katha Cabal, MD;  Location: [place] [facility];  Service: Cardiovascular;  Laterality: Right;   LOWER EXTREMITY ANGIOGRAPHY Right 05/30/2020    Procedure: Lower Extremity Angiography;  Surgeon: Katha Cabal, MD;  Location: [place] [facility];  Service: Cardiovascular;  Laterality: Right;  Pedal Approach   LOWER EXTREMITY ANGIOGRAPHY Left 03/21/2021   Procedure: Lower Extremity Angiography;  Surgeon: Algernon Huxley, MD;  Location: [place] [facility];  Service: Cardiovascular;  Laterality: Left;   Social History:  reports that she has never smoked. She has never used smokeless tobacco. She reports that she does not drink alcohol and does not use drugs.  No Known Allergies  Family History  Problem Relation Age of Onset   Breast cancer Neg Hx      Prior to Admission medications   Medication Sig Start Date End Date Taking? Authorizing Provider  acetaminophen (TYLENOL) 325 MG tablet Take 2 tablets (650 mg total) by mouth every 6 (six) hours as needed for mild pain (or Fever >/= 100.4). 03/30/21   Nita Sells, MD  amLODipine (NORVASC) 10 MG tablet Take 1 tablet by mouth daily. 05/02/16   [provider]  amoxicillin (AMOXIL) 500 MG capsule Take 500 mg by mouth 3 (three) times daily.    [provider]  ASPIRIN 81 PO Take by mouth daily.    [provider]  atorvastatin (LIPITOR) 40 MG tablet Take 1 tablet by mouth at bedtime.  05/02/16   [provider]  bisacodyl (DULCOLAX) 10 MG suppository Place 1 suppository (10 mg total) rectally as needed for moderate constipation. 04/02/20   Merlyn Lot, MD  donepezil (ARICEPT) 10 MG tablet Take 10 mg by mouth at bedtime.  04/21/18   [provider]  gabapentin (NEURONTIN) 100 MG capsule Take 1 capsule (100 mg total) by mouth 2 (two) times daily as needed for up to 14 days. 06/02/20 04/26/21  Sidney Ace, MD  insulin aspart protamine- aspart (NOVOLOG MIX 70/30) (70-30) 100 UNIT/ML injection Inject 0.05 mLs (5 Units total) into the skin 2 (two) times daily with a meal. 03/30/21   Nita Sells, MD  levETIRAcetam (KEPPRA) 250  MG tablet Take 2 tablets (500 mg total) by mouth 2 (two) times daily. 03/30/21   Nita Sells, MD  polyethylene glycol (MIRALAX / GLYCOLAX) 17 g packet Take 17 g by mouth daily. Mix one tablespoon with 8oz of your favorite juice or water every day until you are having soft formed stools. Then start taking once daily if you didn't have a stool the day before. 04/02/20   Merlyn Lot, MD  SYNTHROID 50 MCG tablet Take 1 tablet by mouth daily. 04/21/18   [provider]   Physical Exam: Vitals:   05/02/21 1430 05/02/21 1500 05/02/21 1600 05/02/21 1630  BP: 128/74 (!) 142/78 (!) 146/72 (!) 139/124  Pulse: 93 94 100 99  Resp: 18 18    Temp:      TempSrc:      SpO2: 90% 96% 91%   Weight:      Height:        Wt Readings from Last 3 Encounters:  05/02/21 54.4 kg  03/20/21 90.7 kg  07/27/20 89.8 kg     General:  Appears calm. Disheveled.  Eyes: normal lids, irises & conjunctiva ENT: grossly normal  hearing, poor oral hygiene Cardiovascular: RRR, no m/r/g. 1+ LE edema.  Respiratory: CTA bilaterally, no w/r/r. Normal respiratory effort. Abdomen: soft, ntnd Skin: no rash or induration seen on limited exam Musculoskeletal: grossly normal tone BUE/BLE Psychiatric: grossly normal mood and affect, speech fluent and appropriate Neurologic: grossly non-focal.          Labs on Admission:  Basic Metabolic Panel: Recent Labs  Lab 05/02/21 1155  NA 142  K 4.7  CL 115*  CO2 14*  GLUCOSE 127*  BUN 105*  CREATININE 1.87*  CALCIUM 10.4*   Liver Function Tests: Recent Labs  Lab 05/02/21 1155  AST 35  ALT 32  ALKPHOS 75  BILITOT 0.6  PROT 8.5*  ALBUMIN 2.7*   No results for input(s): LIPASE, AMYLASE in the last 168 hours. No results for input(s): AMMONIA in the last 168 hours. CBC: Recent Labs  Lab 05/02/21 1347  WBC 12.2*  NEUTROABS 9.2*  HGB 10.1*  HCT 32.6*  MCV 93.7  PLT 277   Cardiac Enzymes: No results for input(s): CKTOTAL, CKMB, CKMBINDEX,  TROPONINI in the last 168 hours.  BNP (last 3 results) No results for input(s): BNP in the last 8760 hours.  ProBNP (last 3 results) No results for input(s): PROBNP in the last 8760 hours.  CBG: No results for input(s): GLUCAP in the last 168 hours.  Radiological Exams on Admission: CT ABDOMEN PELVIS WO CONTRAST  Result Date: 05/02/2021 CLINICAL DATA:  Acute left lower quadrant abdominal pain. Large sacral decubitus ulcer. EXAM: CT ABDOMEN AND PELVIS WITHOUT CONTRAST TECHNIQUE: Multidetector CT imaging of the abdomen and pelvis was performed following the standard protocol without IV contrast. RADIATION DOSE REDUCTION: This exam was performed according to the departmental dose-optimization program which includes automated exposure control, adjustment of the mA and/or kV according to patient size and/or use of iterative reconstruction technique. COMPARISON:  04/02/2020 FINDINGS: Lower chest: Minimal left lower lobe atelectasis. Normal sized heart. Coronary artery calcifications. Hepatobiliary: Distended gallbladder with probable dependent sludge. Small liver with no focal abnormality seen. Pancreas: Unremarkable. No pancreatic ductal dilatation or surrounding inflammatory changes. Spleen: Small calcified granulomata/internal arterial calcifications. Normal in size and shape. Adrenals/Urinary Tract: Unremarkable adrenal glands. Bilateral intrarenal arterial calcifications with no definite calculi seen. Unremarkable ureters. Foley catheter in the urinary bladder with a small amount of associated air in the bladder. No significant urine in the bladder. Stomach/Bowel: Small hiatal hernia. Large amount of stool mixed with contrast in the rectum, causing rectal distension without wall thickening. There is also some stool extending through the anus and onto the table. The remainder of the colon is unremarkable as are the small bowel and appendix. Vascular/Lymphatic: Extensive atheromatous arterial  calcifications without aneurysm. No enlarged lymph nodes. Reproductive: Uterus and bilateral adnexa are unremarkable. Other: No abdominal wall hernia or abnormality. No abdominopelvic ascites. Bilateral subcutaneous edema. Musculoskeletal: Lumbar and lower thoracic spine degenerative changes with stable associated subluxations. No fractures. Poorly defined sacral decubitus ulcer extending close to the underlying sacrococcygeal area without bone destruction or periosteal reaction. No soft tissue gas. IMPRESSION: 1. No acute abnormality. Specifically, no explanation for the patient's left lower quadrant abdomen pain. 2. Extensive atheromatous arterial calcifications, including the coronary arteries. 3. Small hiatal hernia. 4. Large amount of stool distending the rectum with evidence of fecal incontinence. Electronically Signed   By: Claudie Revering M.D.   On: 05/02/2021 14:29   DG Chest 2 View  Result Date: 05/02/2021 CLINICAL DATA:  Deep white cough EXAM: CHEST - 2  VIEW COMPARISON:  Chest radiograph 11/15/2019 FINDINGS: The cardiomediastinal silhouette is normal. There is no focal consolidation or pulmonary edema. There is no pleural effusion or pneumothorax. There is no acute osseous abnormality. IMPRESSION: No radiographic evidence of acute cardiopulmonary process. Electronically Signed   By: Valetta Mole M.D.   On: 05/02/2021 12:42   DG Foot Complete Left  Result Date: 05/02/2021 CLINICAL DATA:  Necrotic eschar on heel. Please evaluate for osteo- EXAM: LEFT FOOT - COMPLETE 3+ VIEW COMPARISON:  03/20/2021 FINDINGS: Soft tissue wound posterior to the calcaneus. There is cortical erosion of the posterior calcaneus which has developed since the prior study. This is consistent with osteomyelitis. Spurring of the calcaneus on the plantar surface and at the Achilles tendon insertion. Soft tissue calcification in the region of the Achilles tendon unchanged. Negative for acute fracture. Small metal needle foreign body  in the soft tissues between the first and second toes unchanged from the prior study. Soft tissue swelling of the foot. IMPRESSION: Osteomyelitis of the posterior calcaneus. Electronically Signed   By: Franchot Gallo M.D.   On: 05/02/2021 12:42    EKG: Independently reviewed.  Sinus rhythm with frequent PACs.  Some T wave slurring in leads II, III and aVF.  Compared to prior there are no changes with exception of some resolution of T wave inversions in the lateral leads.  Assessment/Plan Principal Problem:   Physical deconditioning Active Problems:   Diabetic ulcer of right foot associated with type 2 diabetes mellitus, with fat layer exposed ([place])   Essential hypertension   Type 2 diabetes mellitus with stage 3 chronic kidney disease (HCC)   Hypothyroidism (acquired)   CKD (chronic kidney disease), stage IV (HCC)   Congestive heart failure (HCC)   Type 2 diabetes mellitus with diabetic neuropathic arthropathy ([place])   Osteomyelitis (HCC)   QIARA MINETTI is a 76 y.o. female with hx of hypertension, type 2 diabetes, hypothyroidism, CKD stage IV, CHF, multiple pressure ulcers, and osteomyelitis, who presents from home for weakness.  #Physical deconditioning Per discussion with patient's son she has had a significant decline in her physical conditioning since her last admission.  Will need assessment and possible SNF placement. - PT/OT/SLP eval's - Ordered for dysphagia 2 diet based on recent SLP recommendations - Consult transitions of care team - LR at 100 mL an hour for 24 hours  #B12 deficiency #Altered mental status Per review of discharge summary from last admission patient had methylmalonic acid level pending which came back elevated at about 1.5 times the upper limit of normal.  Also appears to have some history of dementia and cognitive decline.  Low suspicion for infection at this time. - Daily B12 supplementation - Continue donepezil  #Osteomyelitis Patient is status post  6-week course of antibiotics for osteomyelitis.  Last spoke with ID on January 19, at that time note from video visit reports need for wound debridement.  Consult wound nurse, determine if reconsultation with podiatry/infectious diseases is warranted at this time.  #Sacral ulcer Consult wound nurse  #Hypoxemia Very strange presentation.  Beyond telling me that she feels unwell for the past few days it is difficult to obtain any other history to evaluate for PE.  As far as I can tell she is not on any chronic oxygen therapy at home.  Does have some mildly increased swelling in right leg compared to left.  I attempted to decrease her oxygen while in the room but was unable to without her desaturating to the  mid to high 80s. - VQ scan ordered given CrCl of 22 - Lower extremity Dopplers  #Chronic medical problems CAD-continue daily aspirin  Hypertension-continue amlodipine  Hyperlipidemia-continue atorvastatin  Hypothyroidism-continue Synthroid  Code Status: Full Code, presumed DVT Prophylaxis: Lovenox Family Communication: Son [name] [name] updated by phone Disposition Plan: Inpatient, Med-Surg   Time spent: 56 min  Clarnce Flock MD/MPH [facility]  Note:  This document was prepared using Systems analyst and may include unintentional dictation errors.

## 2021-05-02 NOTE — ED Notes (Signed)
MD Dione Plover notified of pt failing stroke swallow eval and this RN holding PO medications at this time.

## 2021-05-03 ENCOUNTER — Inpatient Hospital Stay: Payer: Medicare Other

## 2021-05-03 LAB — GLUCOSE, CAPILLARY
Glucose-Capillary: 134 mg/dL — ABNORMAL HIGH (ref 70–99)
Glucose-Capillary: 185 mg/dL — ABNORMAL HIGH (ref 70–99)
Glucose-Capillary: 151 mg/dL — ABNORMAL HIGH (ref 70–99)
Glucose-Capillary: 209 mg/dL — ABNORMAL HIGH (ref 70–99)

## 2021-05-03 MED ORDER — CHLORHEXIDINE GLUCONATE CLOTH 2 % EX PADS
6.0000 | MEDICATED_PAD | Freq: Every day | CUTANEOUS | Status: DC
  Administered 2021-05-03 – 2021-05-10 (×7): 6 via TOPICAL

## 2021-05-03 MED ORDER — ASCORBIC ACID 500 MG PO TABS
500.0000 mg | ORAL_TABLET | Freq: Two times a day (BID) | ORAL | Status: DC
  Administered 2021-05-03 – 2021-05-08 (×10): 500 mg via ORAL
  Filled 2021-05-03 (×14): qty 1

## 2021-05-03 MED ORDER — COLLAGENASE 250 UNIT/GM EX OINT
TOPICAL_OINTMENT | Freq: Every day | CUTANEOUS | Status: DC
  Administered 2021-05-06: 1 via TOPICAL
  Filled 2021-05-03 (×2): qty 30

## 2021-05-03 MED ORDER — PROSOURCE PLUS PO LIQD
30.0000 mL | Freq: Three times a day (TID) | ORAL | Status: DC
  Administered 2021-05-03 – 2021-05-05 (×5): 30 mL via ORAL
  Filled 2021-05-03 (×22): qty 30

## 2021-05-03 MED ORDER — BISACODYL 10 MG RE SUPP
10.0000 mg | Freq: Every day | RECTAL | Status: DC
  Administered 2021-05-03 – 2021-05-09 (×3): 10 mg via RECTAL
  Filled 2021-05-03 (×4): qty 1

## 2021-05-03 MED ORDER — MORPHINE SULFATE (PF) 2 MG/ML IV SOLN: 1.0000 mg | Freq: Once | INTRAVENOUS | Status: DC

## 2021-05-03 MED ORDER — ZINC SULFATE 220 (50 ZN) MG PO CAPS
220.0000 mg | ORAL_CAPSULE | Freq: Every day | ORAL | Status: DC
  Administered 2021-05-03 – 2021-05-08 (×6): 220 mg via ORAL
  Filled 2021-05-03 (×7): qty 1

## 2021-05-03 NOTE — Progress Notes (Signed)
Patient well-known to our service.  Has had continued decline with worsening foot ulcers.  Now has failure to thrive.  At this point, the only surgical therapy of reason would be above-knee amputation.  Son is coming in tomorrow to discuss this with her.

## 2021-05-03 NOTE — Evaluation (Signed)
Occupational Therapy Evaluation Patient Details Name: [name] [name] MRN: [id number] DOB: January 25, 1946 Today's Date: 05/03/2021   History of Present Illness [name] [name] is a 76 y.o. female with hx of hypertension, type 2 diabetes, hypothyroidism, CKD stage IV, CHF, multiple pressure ulcers, and osteomyelitis, who presents from home for weakness.   Clinical Impression   [name] [name] was seen for OT/PT co-evaluation this date. Prior to hospital admission, pt was bed bound, per chart pt's son reports prior to 03/2021 hospitalization pt participated in bed level mobility/ADLs. Pt lives with son available 24/7. Pt currently demonstrating near baseline performance for mobility and ADLs (no change from 03/30/21 therapy evaluation). Pt requires TOTAL A x2 L+R rolling for bed level toileting. Achieves hand to mouth pattern with MIN A on BUE, anticipate MIN assist for feeding/grooming tasks at bed level. TOTAL A for LB access at bed level. PT closing eyes frequently and showing minimal participation in session however able to follow 1 step commands when motivated. No skilled acute OT needs identified. Will sign off. Please re-consult if additional OT needs arise.        Recommendations for follow up therapy are one component of a multi-disciplinary discharge planning process, led by the attending physician.  Recommendations may be updated based on patient status, additional functional criteria and insurance authorization.   Follow Up Recommendations  Long-term institutional care without follow-up therapy    Assistance Recommended at Discharge Frequent or constant Supervision/Assistance  Patient can return home with the following Two people to help with walking and/or transfers;Two people to help with bathing/dressing/bathroom    Functional Status Assessment  Patient has not had a recent decline in their functional status  Equipment Recommendations  Other (comment);Hospital bed (hoyer lift)     Recommendations for Other Services       Precautions / Restrictions Precautions Precautions: Fall Restrictions Weight Bearing Restrictions: Yes LLE Weight Bearing: Weight bearing as tolerated      Mobility Bed Mobility Overal bed mobility: Needs Assistance Bed Mobility: Rolling Rolling: Total assist, +2 for physical assistance              Transfers                   General transfer comment: bed bound at baseline          ADL either performed or assessed with clinical judgement   ADL Overall ADL's : Needs assistance/impaired                                       General ADL Comments: TOTAL A x2 for bed level toileting. Anticipate MAX A hand over hand self-drinking at bed level. TOTAL A for LB access at bed level      Pertinent Vitals/Pain Pain Assessment Pain Assessment: Faces Faces Pain Scale: Hurts little more Pain Location: all over with mobility Pain Descriptors / Indicators: Discomfort, Dull Pain Intervention(s): Limited activity within patient's tolerance, Repositioned     Hand Dominance     Extremity/Trunk Assessment Upper Extremity Assessment Upper Extremity Assessment: Generalized weakness   Lower Extremity Assessment Lower Extremity Assessment: Defer to PT evaluation       Communication Communication Communication: No difficulties   Cognition Arousal/Alertness: Lethargic Behavior During Therapy: Flat affect Overall Cognitive Status: No family/caregiver present to determine baseline cognitive functioning  General Comments: repeats "come back later"                Home Living Family/patient expects to be discharged to:: Private residence Living Arrangements: Children Available Help at Discharge: Family;Available 24 hours/day Type of Home: House Home Access: Stairs to enter     Home Layout: One level     Bathroom Shower/Tub: Tub only   Armed forces operational officer: Standard Bathroom Accessibility: Yes   Home Equipment: Conservation officer, nature (2 wheels);Cane - single point;Wheelchair - manual   Additional Comments: Pulled from previous visit to hospital d/t pt not being able to provide all information.      Prior Functioning/Environment Prior Level of Function : Needs assist             Mobility Comments: bed bound at baseline for years, provides some assist to reposition prior to recent hospitalization ADLs Comments: per chart pt reports independnet ADLs at baseline however supsect pt requires assist 2/2 gross weakness        OT Problem List: Decreased strength;Decreased range of motion;Impaired balance (sitting and/or standing)      OT Treatment/Interventions:      OT Goals(Current goals can be found in the care plan section) Acute Rehab OT Goals Patient Stated Goal: to be left alone OT Goal Formulation: With patient Time For Goal Achievement: 05/17/21 Potential to Achieve Goals: Poor  OT Frequency:      Co-evaluation PT/OT/SLP Co-Evaluation/Treatment: Yes Reason for Co-Treatment: Necessary to address cognition/behavior during functional activity;For patient/therapist safety;To address functional/ADL transfers PT goals addressed during session: Mobility/safety with mobility OT goals addressed during session: ADL's and self-care      AM-PAC OT "6 Clicks" Daily Activity     Outcome Measure Help from another person eating meals?: A Lot Help from another person taking care of personal grooming?: A Lot Help from another person toileting, which includes using toliet, bedpan, or urinal?: Total Help from another person bathing (including washing, rinsing, drying)?: Total Help from another person to put on and taking off regular upper body clothing?: A Lot Help from another person to put on and taking off regular lower body clothing?: Total 6 Click Score: 9   End of Session Nurse Communication: Mobility status  Activity Tolerance:  Patient limited by fatigue Patient left: in bed;with call bell/phone within reach;with bed alarm set  OT Visit Diagnosis: Muscle weakness (generalized) (M62.81)                Time: 4196-2229 OT Time Calculation (min): 16 min Charges:  OT General Charges $OT Visit: 1 Visit OT Evaluation $OT Eval Low Complexity: 1 Low  [name] [name], M.S. OTR/L  05/03/21, 2:02 PM  ascom [phone]

## 2021-05-03 NOTE — Progress Notes (Signed)
[place] [facility] [place] NAME: [name] [name]    MR#:  [id number]  DATE OF BIRTH:  January 05, 1946  SUBJECTIVE:  came in with generalized weakness deconditioning and poor PO intake. Lives with son who helps manage wounds along with home health. Very soft-spoken unable to understand her much. She feels very fatigued and not interested to have conversation. No family at bedside  REVIEW OF SYSTEMS:   Review of Systems  Unable to perform ROS: Mental status change  Tolerating Diet: Tolerating PT:   DRUG ALLERGIES:  No Known Allergies  VITALS:  Blood pressure 107/65, pulse (!) 107, temperature 97.6 F (36.4 C), resp. rate 16, height 5\' 7"  (1.702 m), weight 54.4 kg, SpO2 100 %.  PHYSICAL EXAMINATION:   Physical Exam  GENERAL:  76 y.o.-year-old patient lying in the bed with no acute distress. Weak, deconditioned  LUNGS: Normal breath sounds bilaterally, no wheezing, rales, rhonchi.  CARDIOVASCULAR: S1, S2 normal. No murmurs, rubs, or gallops.  ABDOMEN: Soft, nontender, nondistended. Bowel sounds present.  EXTREMITIES: from Jan 19th, 2023  NEUROLOGIC: nonfocal PSYCHIATRIC:  patient is alert  SKIN:  Pressure Injury 12/04/19 Ankle Anterior;Right Unstageable - Full thickness tissue loss in which the base of the injury is covered by slough (yellow, tan, gray, green or Hodgkiss) and/or eschar (tan, Koral or black) in the wound bed. (Active)  12/04/19 1837  Location: Ankle  Location Orientation: Anterior;Right  Staging: Unstageable - Full thickness tissue loss in which the base of the injury is covered by slough (yellow, tan, gray, green or [place]atherne) and/or eschar (tan, Burby or black) in the wound bed.  Wound Description (Comments):   Present on Admission:      Pressure Injury 05/02/21 Coccyx Right;Left Unstageable - Full thickness tissue loss in which the base of the injury is covered by slough (yellow, tan, gray, green or [place]) and/or eschar (tan, Manner or  black) in the wound bed. black eschar over most ofwo (Active)  05/02/21 1000  Location: Coccyx  Location Orientation: Right;Left  Staging: Unstageable - Full thickness tissue loss in which the base of the injury is covered by slough (yellow, tan, gray, green or Suarez) and/or eschar (tan, Mello or black) in the wound bed.  Wound Description (Comments): black eschar over most ofwound  Present on Admission: Yes     Pressure Injury 05/03/21 Right Stage 2 -  Partial thickness loss of dermis presenting as a shallow open injury with a red, pink wound bed without slough. (Active)  05/03/21 0830  Location:   Location Orientation: Right  Staging: Stage 2 -  Partial thickness loss of dermis presenting as a shallow open injury with a red, pink wound bed without slough.  Wound Description (Comments):   Present on Admission: Yes        LABORATORY PANEL:  CBC Recent Labs  Lab 05/02/21 1347  WBC 12.2*  HGB 10.1*  HCT 32.6*  PLT 277    Chemistries  Recent Labs  Lab 05/02/21 1155  NA 142  K 4.7  CL 115*  CO2 14*  GLUCOSE 127*  BUN 105*  CREATININE 1.87*  CALCIUM 10.4*  AST 35  ALT 32  ALKPHOS 75  BILITOT 0.6   Cardiac Enzymes No results for input(s): TROPONINI in the last 168 hours. RADIOLOGY:  CT ABDOMEN PELVIS WO CONTRAST  Result Date: 05/02/2021 CLINICAL DATA:  Acute left lower quadrant abdominal pain. Large sacral decubitus ulcer. EXAM: CT ABDOMEN AND PELVIS WITHOUT CONTRAST TECHNIQUE: Multidetector  CT imaging of the abdomen and pelvis was performed following the standard protocol without IV contrast. RADIATION DOSE REDUCTION: This exam was performed according to the departmental dose-optimization program which includes automated exposure control, adjustment of the mA and/or kV according to patient size and/or use of iterative reconstruction technique. COMPARISON:  04/02/2020 FINDINGS: Lower chest: Minimal left lower lobe atelectasis. Normal sized heart. Coronary artery  calcifications. Hepatobiliary: Distended gallbladder with probable dependent sludge. Small liver with no focal abnormality seen. Pancreas: Unremarkable. No pancreatic ductal dilatation or surrounding inflammatory changes. Spleen: Small calcified granulomata/internal arterial calcifications. Normal in size and shape. Adrenals/Urinary Tract: Unremarkable adrenal glands. Bilateral intrarenal arterial calcifications with no definite calculi seen. Unremarkable ureters. Foley catheter in the urinary bladder with a small amount of associated air in the bladder. No significant urine in the bladder. Stomach/Bowel: Small hiatal hernia. Large amount of stool mixed with contrast in the rectum, causing rectal distension without wall thickening. There is also some stool extending through the anus and onto the table. The remainder of the colon is unremarkable as are the small bowel and appendix. Vascular/Lymphatic: Extensive atheromatous arterial calcifications without aneurysm. No enlarged lymph nodes. Reproductive: Uterus and bilateral adnexa are unremarkable. Other: No abdominal wall hernia or abnormality. No abdominopelvic ascites. Bilateral subcutaneous edema. Musculoskeletal: Lumbar and lower thoracic spine degenerative changes with stable associated subluxations. No fractures. Poorly defined sacral decubitus ulcer extending close to the underlying sacrococcygeal area without bone destruction or periosteal reaction. No soft tissue gas. IMPRESSION: 1. No acute abnormality. Specifically, no explanation for the patient's left lower quadrant abdomen pain. 2. Extensive atheromatous arterial calcifications, including the coronary arteries. 3. Small hiatal hernia. 4. Large amount of stool distending the rectum with evidence of fecal incontinence. Electronically Signed   By: Claudie Revering M.D.   On: 05/02/2021 14:29   DG Chest 2 View  Result Date: 05/02/2021 CLINICAL DATA:  Deep white cough EXAM: CHEST - 2 VIEW COMPARISON:  Chest  radiograph 11/15/2019 FINDINGS: The cardiomediastinal silhouette is normal. There is no focal consolidation or pulmonary edema. There is no pleural effusion or pneumothorax. There is no acute osseous abnormality. IMPRESSION: No radiographic evidence of acute cardiopulmonary process. Electronically Signed   By: Valetta Mole M.D.   On: 05/02/2021 12:42   US Venous Img Lower Bilateral (DVT)  Result Date: 05/02/2021 CLINICAL DATA:  Pain and swelling, shortness of breath EXAM: Bilateral LOWER EXTREMITY VENOUS DOPPLER ULTRASOUND TECHNIQUE: Gray-scale sonography with compression, as well as color and duplex ultrasound, were performed to evaluate the deep venous system(s) from the level of the common femoral vein through the popliteal and proximal calf veins. COMPARISON:  None. FINDINGS: VENOUS Normal compressibility of the common femoral, superficial femoral, and popliteal veins, as well as the visualized calf veins. Right peroneal and posterior tibial veins are not adequately visualized for evaluation. Visualized portions of profunda femoral vein and great saphenous vein unremarkable. No filling defects to suggest DVT on grayscale or color Doppler imaging. Doppler waveforms show normal direction of venous flow, normal respiratory plasticity and response to augmentation. Limited views of the contralateral common femoral vein are unremarkable. OTHER There is diffuse edema in subcutaneous plane without loculated fluid collections. Limitations: Diffuse edema in the subcutaneous plane and patient's inability to cooperate optimally for positioning limits examination. IMPRESSION: There is no evidence of deep venous thrombosis in the visualized major deep veins in both lower extremities. Right peroneal and right posterior tibial veins are not adequately visualized for evaluation. If there are continued symptoms, short-term  follow-up venous Doppler examination may be considered. Electronically Signed   By: Elmer Picker  M.D.   On: 05/02/2021 19:07   DG Foot Complete Left  Result Date: 05/02/2021 CLINICAL DATA:  Necrotic eschar on heel. Please evaluate for osteo- EXAM: LEFT FOOT - COMPLETE 3+ VIEW COMPARISON:  03/20/2021 FINDINGS: Soft tissue wound posterior to the calcaneus. There is cortical erosion of the posterior calcaneus which has developed since the prior study. This is consistent with osteomyelitis. Spurring of the calcaneus on the plantar surface and at the Achilles tendon insertion. Soft tissue calcification in the region of the Achilles tendon unchanged. Negative for acute fracture. Small metal needle foreign body in the soft tissues between the first and second toes unchanged from the prior study. Soft tissue swelling of the foot. IMPRESSION: Osteomyelitis of the posterior calcaneus. Electronically Signed   By: Franchot Gallo M.D.   On: 05/02/2021 12:42   ASSESSMENT AND PLAN:  [name] [name] is a 76 y.o. female with hx of hypertension, type 2 diabetes, hypothyroidism, CKD stage IV, CHF, multiple pressure ulcers, and osteomyelitis, who presents from home for weakness.  Patient recently admitted from December 13 to March 30, 2021.  She was admitted with extensive osteomyelitis of the left foot.  She underwent revascularization as well as debridement.   X-ray of the left foot showed ongoing findings consistent with osteomyelitis.   CT abdomen and pelvis without contrast was obtained for report of some left lower quadrant abdominal pain, which showed no acute findings with exception of large amount of stool in the rectum.  Chronic left lower extremity osteomyelitis --Patient is status post 6-week course of antibiotics for osteomyelitis.   --Last spoke with ID on January 19, at that time note from video visit reports need for wound debridement.   -- Discussed with vascular surgery Dr. Lucky Cowboy. Recommends amputation. -- Spoke with patient's son [name] and he will discuss with mother about amputation as well.    Sacral ulcer is in on admission Consult wound nurse-- recommendations note  Physical deconditioning failure to thrive/poor PO intake Per discussion with patient's son she has had a significant decline in her physical conditioning since her last admission.  Will need assessment and possible SNF placement. - PT/OT/SLP eval's - Ordered for dysphagia 2 diet based on recent SLP recommendations - palliative care consultation   B12 deficiency dementia - Daily B12 supplementation - Continue donepezil  hypoxia noted in the emergency room. -- Patient is not seem to be in respiratory distress. -- Chest x-ray no acute cardiopulmonary abnormality -- unable to do VQ scans since patient could not tolerate. -- Lower extremity ultrasound shows no DVT -- will wean oxygen to room air and continue to monitor  COVID positive, incidental -- no fever no respiratory distress   Procedures: Family communication : son [name] on the phone Consults : vascular palliative CODE STATUS: full code DVT Prophylaxis : enoxaparin Level of care: Med-Surg Status is: Inpatient  Remains inpatient appropriate because: chronic osteomyelitis lower extremity. Patient likely will need amputation. Vascular consultation        TOTAL TIME TAKING CARE OF THIS PATIENT: 25 minutes.  >50% time spent on counselling and coordination of care  Note: This dictation was prepared with Dragon dictation along with smaller phrase technology. Any transcriptional errors that result from this process are unintentional.  Fritzi Mandes M.D    [facility]   CC: Primary care physician; Pcp, No

## 2021-05-03 NOTE — Evaluation (Addendum)
Clinical/Bedside Swallow Evaluation Patient Details  Name: [name] [name] MRN: [id number] Date of Birth: [birth year]  Today's Date: 05/03/2021 Time: SLP Start Time (ACUTE ONLY): 0840 SLP Stop Time (ACUTE ONLY): 0910 SLP Time Calculation (min) (ACUTE ONLY): 30 min  Past Medical History:  Past Medical History:  Diagnosis Date   Chronic diastolic CHF (congestive heart failure), NYHA class 3 ([place]) 06/30/2019   Diabetes mellitus without complication ([place])    Humerus fracture 04/20/2018   left   Hyperlipidemia    Hypertension    Hypothyroidism    Vertigo    several yrs ago   Wears dentures    partial upper and lower (loose)   Past Surgical History:  Past Surgical History:  Procedure Laterality Date   AMPUTATION TOE Right 05/31/2020   Procedure: AMPUTATION TOE;  Surgeon: [name] [name], DPM;  Location: [facility];  Service: Podiatry;  Laterality: Right;   CATARACT EXTRACTION W/PHACO Left 12/01/2018   Procedure: CATARACT EXTRACTION PHACO AND INTRAOCULAR LENS PLACEMENT ([place]) LEFT DIABETIC;  Surgeon: [name] Robson, MD;  Location: [place];  Service: Ophthalmology;  Laterality: Left;  Diabetic - insulin   COLONOSCOPY WITH PROPOFOL N/A 07/07/2019   Procedure: COLONOSCOPY WITH PROPOFOL;  Surgeon: [name] Bellow, MD;  Location: [facility];  Service: Endoscopy;  Laterality: N/A;   ESOPHAGOGASTRODUODENOSCOPY (EGD) WITH PROPOFOL N/A 07/07/2019   Procedure: ESOPHAGOGASTRODUODENOSCOPY (EGD) WITH PROPOFOL;  Surgeon: [name] Bellow, MD;  Location: [facility];  Service: Endoscopy;  Laterality: N/A;   FOOT SURGERY Right    lesion excision   IRRIGATION AND DEBRIDEMENT FOOT Left 03/22/2021   Procedure: IRRIGATION AND DEBRIDEMENT FOOT AND BONE BIOPSY;  Surgeon: [name] [name], DPM;  Location: [facility];  Service: Podiatry;  Laterality: Left;   LOWER EXTREMITY ANGIOGRAPHY Right 05/19/2020   Procedure: Lower Extremity Angiography;  Surgeon: [name] Cabal, MD;  Location: [place] [facility];  Service: Cardiovascular;  Laterality: Right;   LOWER EXTREMITY ANGIOGRAPHY Right 05/30/2020   Procedure: Lower Extremity Angiography;  Surgeon: [name] Cabal, MD;  Location: [place] [facility];  Service: Cardiovascular;  Laterality: Right;  Pedal Approach   LOWER EXTREMITY ANGIOGRAPHY Left 03/21/2021   Procedure: Lower Extremity Angiography;  Surgeon: [name] Huxley, MD;  Location: [place] [facility];  Service: Cardiovascular;  Laterality: Left;   HPI:  Per Physician's H&P "[name] [name] is a 76 y.o. female with hx of hypertension, type 2 diabetes, hypothyroidism, CKD stage IV, CHF, multiple pressure ulcers, and osteomyelitis, who presents from home for weakness.     Patient recently admitted from December 13 to March 30, 2021.  She was admitted with extensive osteomyelitis of the left foot.  She underwent revascularization as well as debridement.  She also had altered mental status will started on Keppra by neurology.     Patient is a limited historian during my interview.  She reports she has been feeling unwell for the past 3 or 4 days, unable to eat or drink much of anything.  Also states she has been unable to get out of bed.  I asked regarding her ADLs, and she reports that she is independent in all of them.     I subsequently called her son [name] [name] who is her caretaker.  He reported that she is independent in all of her ADLs at baseline, she is to have some mobility within her bed but this has deteriorated significantly since her last hospitalization.  He reports she has been taking all her medications as prescribed  and finished her course of antibiotics for her osteomyelitis.     In the ED initial vital signs notable for hypoxemia requiring 4 L nasal cannula.  Patient reported she does not use oxygen at baseline at home.  Lab work-up showed CBC with mildly elevated white count of 12 but otherwise at her baseline and with improved anemia.  CMP showed stable CKD, otherwise  unremarkable.  UA was also unremarkable.  Lactic acid and troponin were negative.  Chest x-ray showed no evidence of acute cardiopulmonary process.  X-ray of the left foot showed ongoing findings consistent with osteomyelitis.  CT abdomen and pelvis without contrast was obtained for report of some left lower quadrant abdominal pain, which showed no acute findings with exception of large amount of stool in the rectum."    Assessment / Plan / Recommendation  Clinical Impression  Pt seen for clinical swallowing evaluation. Pt alert, slow to respond, and pleasantly confused. Denies dysphagia, ?reliability of historian. Pt known to SLP services from recent admission in December 2022 with recommendation for a Dysphagia 2 Diet with Thin Liquids and safe swallowing strategies (outlined in note; see note from December 2022 for details).   Per chart review, CXR on admission showed "No radiographic evidence of acute cardiopulmonary process." Temp WNL, WBC elevated.   Pt given trials of ice chips, thin liquids (via tsp, cup, and straw sip), pureed, and solid. Pt required physical assistance for repositioning for upright position. Pt unable to feed self despite hand-over-hand assistance. Pt presents with s/sx moderate oral dysphagia c/b prolonged/inefficient mastication with munching pattern, anterior oral holding, and moderate oral residual with solids which took several cued liquid washes to clear. Pt appeared to have reduced awareness of solid trials and subsequent residual. Suspect oral deficits due to lingual weakness and dental status. Oral deficits likely exacerbated by mental status. Pharyngeal swallow appeared [facility] per clinical assessment. No overt or subtle s/sx pharyngeal dysphagia observed. To palpation, pt with seemingly timely swallow initiation and seemingly adequate laryngeal elevation. No change to vocal quality noted across trials.   Recommend cautious initiation of a pureed diet with thin liquids and  safe swallowing strategies/aspiration precautions as outlined below.   Pt is at increased risk for aspiration/aspiration PNA given mental status, dental status, current dependence for feeding, and multiple medical comorbidities.   SLP to f/u per POC for diet tolerance and trials of upgraded textures, as appropriate.  Pt and RN made aware of diet recommendations, safe swallowing strategies/aspiration precautions, and SLP POC. ?full understanding by pt.     SLP Visit Diagnosis: Dysphagia, oral phase (R13.11)    Aspiration Risk  Mild aspiration risk;Moderate aspiration risk    Diet Recommendation Dysphagia 1 (Puree);Thin liquid   Medication Administration: Whole meds with puree (vs crushed) Supervision: Staff to assist with self feeding;Full supervision/cueing for compensatory strategies Compensations: Minimize environmental distractions;Slow rate;Small sips/bites;Follow solids with liquid Postural Changes: Seated upright at 90 degrees;Remain upright for at least 30 minutes after po intake    Other  Recommendations Oral Care Recommendations: Oral care QID;Oral care before and after PO    Recommendations for follow up therapy are one component of a multi-disciplinary discharge planning process, led by the attending physician.  Recommendations may be updated based on patient status, additional functional criteria and insurance authorization.  Follow up Recommendations  (TBD)      Assistance Recommended at Discharge Frequent or constant Supervision/Assistance  Functional Status Assessment Patient has had a recent decline in their functional status and demonstrates the  ability to make significant improvements in function in a reasonable and predictable amount of time.  Frequency and Duration min 2x/week  2 weeks       Prognosis Prognosis for Safe Diet Advancement: Fair Barriers to Reach Goals: Cognitive deficits;Behavior      Swallow Study   General Date of Onset: 05/02/21 HPI:  Per 50 H&P "EMMI WERTHEIM is a 76 y.o. female with hx of hypertension, type 2 diabetes, hypothyroidism, CKD stage IV, CHF, multiple pressure ulcers, and osteomyelitis, who presents from home for weakness.     Patient recently admitted from December 13 to March 30, 2021.  She was admitted with extensive osteomyelitis of the left foot.  She underwent revascularization as well as debridement.  She also had altered mental status will started on Keppra by neurology.     Patient is a limited historian during my interview.  She reports she has been feeling unwell for the past 3 or 4 days, unable to eat or drink much of anything.  Also states she has been unable to get out of bed.  I asked regarding her ADLs, and she reports that she is independent in all of them.     I subsequently called her son [name] [name] who is her caretaker.  He reported that she is independent in all of her ADLs at baseline, she is to have some mobility within her bed but this has deteriorated significantly since her last hospitalization.  He reports she has been taking all her medications as prescribed and finished her course of antibiotics for her osteomyelitis.     In the ED initial vital signs notable for hypoxemia requiring 4 L nasal cannula.  Patient reported she does not use oxygen at baseline at home.  Lab work-up showed CBC with mildly elevated white count of 12 but otherwise at her baseline and with improved anemia.  CMP showed stable CKD, otherwise unremarkable.  UA was also unremarkable.  Lactic acid and troponin were negative.  Chest x-ray showed no evidence of acute cardiopulmonary process.  X-ray of the left foot showed ongoing findings consistent with osteomyelitis.  CT abdomen and pelvis without contrast was obtained for report of some left lower quadrant abdominal pain, which showed no acute findings with exception of large amount of stool in the rectum." Type of Study: Bedside Swallow Evaluation Previous Swallow  Assessment: clinical swallowing evaluation 03/28/21 recommended Dysphagia 2 Diet with Thin Liquids Diet Prior to this Study: NPO Temperature Spikes Noted: No Respiratory Status: Nasal cannula (4L/min) Behavior/Cognition: Alert;Cooperative;Pleasant mood;Requires cueing;Confused Oral Cavity Assessment: Dried secretions Oral Care Completed by SLP: Yes Oral Cavity - Dentition: Missing dentition (pt not wearing partials) Vision: Functional for self-feeding (however, pt unable to feed self despite HOH cues) Self-Feeding Abilities: Total assist Patient Positioning: Upright in bed (assistance for repositoining) Baseline Vocal Quality: Normal Volitional Cough: Cognitively unable to elicit Volitional Swallow: Unable to elicit    Oral/Motor/Sensory Function Overall Oral Motor/Sensory Function: Mild impairment Lingual ROM: Reduced right;Reduced left (vs effort) Lingual Strength: Reduced (vs effort)   Ice Chips Ice chips: Within functional limits Presentation: Spoon   Thin Liquid Thin Liquid: Within functional limits Presentation: Cup;Spoon;Straw Other Comments: ~6 oz    Puree Puree: Within functional limits Presentation: Spoon Other Comments: ~2 oz   Solid     Solid: Impaired Oral Phase Impairments: Poor awareness of bolus;Impaired mastication Oral Phase Functional Implications: Prolonged oral transit;Impaired mastication;Oral residue;Oral holding     Cherrie Gauze, M.S., [place]  [facility] [phone] ([place])   Clearnce Sorrel Aviva Wolfer 05/03/2021,11:15 AM

## 2021-05-03 NOTE — Evaluation (Signed)
Physical Therapy Evaluation Patient Details Name: [name] [name] MRN: [id number] DOB: 28-Nov-1945 Today's Date: 05/03/2021  History of Present Illness  [name] [name] is a 76 y.o. female with hx of hypertension, type 2 diabetes, hypothyroidism, CKD stage IV, CHF, multiple pressure ulcers, and osteomyelitis, who presents from home for weakness.   Clinical Impression  Pt received supine in bed, head positioned in cervical rotation to the right. Pt unable to bring head to neutral. She is unable to provide home setup; PLOF received from chart review. Chart states she was bedbound and independent with ADLs - unsure of accuracy. Pt did not participate in therapy session requiring total assist of 2 to roll and perform pericare. Minimal muscle activation palpable throughout BLE with decreased ROM at the ankles. Generalized weakness throughout BUE; mild swelling noted in the R arm. PT to complete order as pt appears to be at her baseline and is unable to participate within session. Please reconsult if pt status changes.       Recommendations for follow up therapy are one component of a multi-disciplinary discharge planning process, led by the attending physician.  Recommendations may be updated based on patient status, additional functional criteria and insurance authorization.  Follow Up Recommendations Long-term institutional care without follow-up therapy    Assistance Recommended at Discharge Frequent or constant Supervision/Assistance  Patient can return home with the following  Two people to help with walking and/or transfers;Direct supervision/assist for medications management;Assist for transportation;Assistance with cooking/housework;Two people to help with bathing/dressing/bathroom;Assistance with feeding;Direct supervision/assist for financial management;Help with stairs or ramp for entrance    Equipment Recommendations None recommended by PT  Recommendations for Other Services        Functional Status Assessment Patient has not had a recent decline in their functional status     Precautions / Restrictions Precautions Precautions: Fall Restrictions Weight Bearing Restrictions: Yes LLE Weight Bearing: Weight bearing as tolerated      Mobility  Bed Mobility Overal bed mobility: Needs Assistance Bed Mobility: Rolling Rolling: Total assist, +2 for physical assistance         General bed mobility comments: Rolling performed for chuck placement/hygiene.    Transfers                   General transfer comment: bed bound at baseline    Ambulation/Gait                  Stairs            Wheelchair Mobility    Modified Rankin (Stroke Patients Only)       Balance Overall balance assessment:  (remained lying in bed)                                           Pertinent Vitals/Pain Pain Assessment Pain Assessment: Faces Faces Pain Scale: Hurts little more Pain Location: all over with mobility Pain Descriptors / Indicators: Discomfort, Dull Pain Intervention(s): Limited activity within patient's tolerance, Repositioned    Home Living Family/patient expects to be discharged to:: Private residence Living Arrangements: Children Available Help at Discharge: Family;Available 24 hours/day Type of Home: House Home Access: Stairs to enter       Home Layout: One level Home Equipment: Conservation officer, nature (2 wheels);Cane - single point;Wheelchair - manual Additional Comments: Pulled from previous visit to hospital d/t pt not being able to provide all  information.    Prior Function Prior Level of Function : Needs assist             Mobility Comments: bed bound at baseline for years, provides some assist to reposition prior to recent hospitalization ADLs Comments: per chart pt reports independnet ADLs at baseline however supsect pt requires assist 2/2 gross weakness     Hand Dominance        Extremity/Trunk  Assessment   Upper Extremity Assessment Upper Extremity Assessment: Generalized weakness    Lower Extremity Assessment Lower Extremity Assessment: Generalized weakness (global weakness, decreased participation when testing)       Communication   Communication: No difficulties  Cognition Arousal/Alertness: Lethargic Behavior During Therapy: Flat affect Overall Cognitive Status: No family/caregiver present to determine baseline cognitive functioning                                 General Comments: repeats "come back later"        General Comments      Exercises     Assessment/Plan    PT Assessment Patient does not need any further PT services  PT Problem List Decreased strength;Decreased mobility;Decreased safety awareness;Decreased range of motion;Decreased activity tolerance;Cardiopulmonary status limiting activity;Decreased balance;Impaired sensation;Pain       PT Treatment Interventions      PT Goals (Current goals can be found in the Care Plan section)  Acute Rehab PT Goals PT Goal Formulation: Patient unable to participate in goal setting    Frequency       Co-evaluation PT/OT/SLP Co-Evaluation/Treatment: Yes Reason for Co-Treatment: Necessary to address cognition/behavior during functional activity;For patient/therapist safety;To address functional/ADL transfers PT goals addressed during session: Mobility/safety with mobility OT goals addressed during session: ADL's and self-care       AM-PAC PT "6 Clicks" Mobility  Outcome Measure Help needed turning from your back to your side while in a flat bed without using bedrails?: Total Help needed moving from lying on your back to sitting on the side of a flat bed without using bedrails?: Total Help needed moving to and from a bed to a chair (including a wheelchair)?: Total Help needed standing up from a chair using your arms (e.g., wheelchair or bedside chair)?: Total Help needed to walk in  hospital room?: Total Help needed climbing 3-5 steps with a railing? : Total 6 Click Score: 6    End of Session Equipment Utilized During Treatment: Oxygen Activity Tolerance: Patient limited by pain;Patient limited by lethargy Patient left: in bed;with call bell/phone within reach;with bed alarm set Nurse Communication: Mobility status PT Visit Diagnosis: Muscle weakness (generalized) (M62.81);Adult, failure to thrive (R62.7)    Time: 7741-2878 PT Time Calculation (min) (ACUTE ONLY): 16 min   Charges:   PT Evaluation $PT Eval High Complexity: 1 High          Patrina Levering PT, DPT 05/03/21 3:42 PM [phone]

## 2021-05-03 NOTE — Consult Note (Addendum)
WOC Nurse Consult Note: Reason for Consult: Consult requested for left heel, sacrum, and right inner ankle. Pt is in isolation for Covid and has been followed prior to admission for left heel by podiatry and the vascular team, according to progress notes. Wound type: Sacrum with Unstageable pressure injury; 5X10cm, 10% red, 90% black eschar, smalll amt tan drainage.  It is difficult to keep wound from becoming soiled related to incontinent stools, which are too thick for Flexiseal use. Right inner ankle with Stage 2 pressure injury; 1X1X.1cm, red and dry.  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 Pressure Injury POA: Yes Dressing procedure/placement/frequency: Float heels in Prevalon boots to reduce pressure.  Foam dressing to right ankle to protect and promote healing. Topical treatment orders provided for bedside nurses to perform as follows to assist with enzymatic debridement: Apply Santyl to sacrum and left heel Q day, then cover with moist gauze and foam dressings.  (Change foam dressing Q 3 days or PRN soiling.) Please re-consult if further assistance is needed.  Thank-you,  Julien Girt MSN, [place], [place], [place], [place]

## 2021-05-03 NOTE — Progress Notes (Signed)
Initial Nutrition Assessment  DOCUMENTATION CODES:   Non-severe (moderate) malnutrition in context of chronic illness  INTERVENTION:   -MVI with minerals daily -500 mg vitamin C BID -220 mg zinc sulfate daily x 14 days -Hormel Shake TID, each supplement provides 520 kcals and 22 grams protein -30 ml Prosource Plus TID, each supplement provides 100 kcals and 15 grams protein -Feeding assistance with meals  NUTRITION DIAGNOSIS:   Moderate Malnutrition related to chronic illness (dementia) as evidenced by mild fat depletion, moderate fat depletion, mild muscle depletion, moderate muscle depletion.  GOAL:   Patient will meet greater than or equal to 90% of their needs  MONITOR:   PO intake, Supplement acceptance, Diet advancement, Labs, Weight trends, Skin, I & O's  REASON FOR ASSESSMENT:   Malnutrition Screening Tool    ASSESSMENT:   [name] [name] is a 76 y.o. female with hx of hypertension, type 2 diabetes, hypothyroidism, CKD stage IV, CHF, multiple pressure ulcers, and osteomyelitis, who presents from home for weakness.  Pt admitted with generalized weakness.   Spoke with pt at bedside, who reports feeling better today. Observed pt take medications from RN in applesauce and consume some orange juice without difficulty. Assisted pt with breakfast tray- pt consumed about 80% of pureed pineapple. Pt refused oatmeal, stating "it's too hot".   Pt reports decreased appetite "for a long time", but unable to provide further details despite probing. RD attempted to obtain further history from pt, however, she stated "Stop! You are asking me too many questions". RD reoriented pt to place and situation and assured pt that RD was asking these questions to best care for her.    Reviewed wt hx; pt has experienced a 40% wt loss over the past month, however, suspect wt from 05/02/21 is an outlier.   Medications reviewed and include miralax and vitamin B-12.   Labs reviewed: CBGS:  123-185.   NUTRITION - FOCUSED PHYSICAL EXAM:  Flowsheet Row Most Recent Value  Orbital Region Mild depletion  Upper Arm Region Moderate depletion  Thoracic and Lumbar Region Mild depletion  Buccal Region Mild depletion  Temple Region Moderate depletion  Clavicle Bone Region Moderate depletion  Clavicle and Acromion Bone Region Moderate depletion  Scapular Bone Region Moderate depletion  Dorsal Hand Mild depletion  Patellar Region Moderate depletion  Anterior Thigh Region Moderate depletion  Posterior Calf Region Moderate depletion  Edema (RD Assessment) None  Hair Reviewed  Eyes Reviewed  Mouth Reviewed  Skin Reviewed  Nails Reviewed       Diet Order:   Diet Order             DIET - DYS 1 Room service appropriate? Yes with Assist; Fluid consistency: Thin  Diet effective now                   EDUCATION NEEDS:   Education needs have been addressed  Skin:  Skin Assessment: Skin Integrity Issues: Skin Integrity Issues:: Unstageable Unstageable: rt heel, coccyx  Last BM:  05/03/21  Height:   Ht Readings from Last 1 Encounters:  05/02/21 5\' 7"  (1.702 m)    Weight:   Wt Readings from Last 1 Encounters:  05/02/21 54.4 kg    Ideal Body Weight:  61.4 kg  BMI:  Body mass index is 18.79 kg/m.  Estimated Nutritional Needs:   Kcal:  1950-2150  Protein:  100-125 grams  Fluid:  > 1.9 L    Loistine Chance, RD, LDN, [place] Registered Dietitian II Certified Diabetes  Care and Education Specialist Please refer to [facility] for RD and/or RD on-call/weekend/after hours pager

## 2021-05-04 DIAGNOSIS — M86372 Chronic multifocal osteomyelitis, left ankle and foot: Secondary | ICD-10-CM

## 2021-05-04 DIAGNOSIS — Z7189 Other specified counseling: Secondary | ICD-10-CM

## 2021-05-04 DIAGNOSIS — M869 Osteomyelitis, unspecified: Secondary | ICD-10-CM

## 2021-05-04 DIAGNOSIS — R627 Adult failure to thrive: Secondary | ICD-10-CM

## 2021-05-04 DIAGNOSIS — Z515 Encounter for palliative care: Secondary | ICD-10-CM

## 2021-05-04 LAB — RENAL FUNCTION PANEL
Albumin: 2.3 g/dL — ABNORMAL LOW (ref 3.5–5.0)
Anion gap: 12 (ref 5–15)
BUN: 77 mg/dL — ABNORMAL HIGH (ref 8–23)
CO2: 18 mmol/L — ABNORMAL LOW (ref 22–32)
Calcium: 9.9 mg/dL (ref 8.9–10.3)
Chloride: 115 mmol/L — ABNORMAL HIGH (ref 98–111)
Creatinine, Ser: 2.1 mg/dL — ABNORMAL HIGH (ref 0.44–1.00)
GFR, Estimated: 24 mL/min — ABNORMAL LOW (ref >60)
Glucose, Bld: 177 mg/dL — ABNORMAL HIGH (ref 70–99)
Phosphorus: 3 mg/dL (ref 2.5–4.6)
Potassium: 4.3 mmol/L (ref 3.5–5.1)
Sodium: 145 mmol/L (ref 135–145)

## 2021-05-04 LAB — URINE CULTURE: Culture: NO GROWTH

## 2021-05-04 LAB — GLUCOSE, CAPILLARY
Glucose-Capillary: 126 mg/dL — ABNORMAL HIGH (ref 70–99)
Glucose-Capillary: 153 mg/dL — ABNORMAL HIGH (ref 70–99)
Glucose-Capillary: 195 mg/dL — ABNORMAL HIGH (ref 70–99)
Glucose-Capillary: 210 mg/dL — ABNORMAL HIGH (ref 70–99)

## 2021-05-04 MED ORDER — SODIUM CHLORIDE 0.9 % IV SOLN: INTRAVENOUS | Status: DC

## 2021-05-04 MED ORDER — STERILE WATER FOR INJECTION IV SOLN
INTRAVENOUS | Status: DC
  Filled 2021-05-04 (×3): qty 150
  Filled 2021-05-04: qty 1000

## 2021-05-04 MED ORDER — MORPHINE SULFATE (PF) 2 MG/ML IV SOLN
2.0000 mg | INTRAVENOUS | Status: DC | PRN
Start: 2021-05-04 — End: 2021-05-10
  Administered 2021-05-04: 2 mg via INTRAVENOUS
  Filled 2021-05-04: qty 1

## 2021-05-04 MED ORDER — TRAMADOL HCL 50 MG PO TABS
50.0000 mg | ORAL_TABLET | Freq: Four times a day (QID) | ORAL | Status: DC | PRN
  Administered 2021-05-06: 50 mg via ORAL
  Filled 2021-05-04 (×2): qty 1

## 2021-05-04 NOTE — Plan of Care (Signed)
  Problem: Health Behavior/Discharge Planning: Goal: Ability to manage health-related needs will improve Outcome: Progressing   

## 2021-05-04 NOTE — Care Management Important Message (Signed)
Important Message  Patient Details  Name: [name] [name] MRN: [id number] Date of Birth: 1946-02-25   Medicare Important Message Given:  N/A - LOS <3 / Initial given by admissions     [name] [name] 05/04/2021, 8:58 AM

## 2021-05-04 NOTE — Consult Note (Signed)
Consultation Note Date: 05/04/2021   Patient Name: [name] [name]  DOB: [birth year]  MRN: [id number]  Age / Sex: 76 y.o., female  PCP: Pcp, No Referring Physician: Fritzi Mandes, MD  Reason for Consultation: Establishing goals of care  HPI/Patient Profile: 76 y.o. female  with past medical history of hypertension, type 2 diabetes, hypothyroidism, CKD stage IV, CHF, multiple pressure ulcers, and osteomyelitis admitted on 05/02/2021 with weakness. Patient recently admitted from December 13 to March 30, 2021 with extensive osteomyelitis of the left foot.She underwent revascularization as well as debridement. Patient with ongoing osteomyelitis this admission with recommendation for amputation. Patient also with failure to thrive/poor PO intake. Found to be covid positive incidentally. PMT consulted to discuss [place].   Clinical Assessment and Goals of Care: I have reviewed medical records including EPIC notes, labs and imaging, received report from Dr. Posey Pronto, assessed the patient and then met with patient's son [name]  to discuss diagnosis prognosis, GOC, EOL wishes, disposition and options.  I introduced Palliative Medicine as specialized medical care for people living with serious illness. It focuses on providing relief from the symptoms and stress of a serious illness. The goal is to improve quality of life for both the patient and the family.  We discussed a brief life review of the patient. [name] shares that patient lives with him. He serves as care giver and also has home health services.   As far as functional and nutritional status, [name] tells me of decline. He tells me about patient's loss of function since 2021. He tells me about hospitalization in Dec 2022 and since that time patient has had cognitive decline as well as poor PO intake.    We discussed patient's current illness and what it means in the larger context of patient's on-going  co-morbidities.  Natural disease trajectory and expectations at EOL were discussed. We discuss recommendation for amputation. Patient has shared she does not want that. [name] wants to honor patient's wishes. We discuss that if we did not pursue amputation we would focus on patient's comfort and ensure she is not suffering while also acknowledging she is likely nearing end of life. We discuss that if we did pursue surgery patient is high risk. We discuss that recovery would be difficult d/t poor nutrition. Discuss that with patient's recent decline in function, nutrition, and cognition she is likely nearing end of life no matter medical path that is pursued. We discussed how to honor patient's by making decisions for them they would likely make for themselves if they were able to understand all options and potential outcomes.   I attempted to elicit values and goals of care important to the patient.  [name] shares in the past his mother has always requested resuscitation attempts. We discuss that patient's health has decline.  Encouraged [name] to consider DNR/DNI status understanding evidenced based poor outcomes in similar hospitalized patients, as the cause of the arrest is likely associated with chronic/terminal disease rather than a reversible acute cardio-pulmonary event. Discussed concern that full code interventions would not lead to any good outcome for patient.   Discussed with [name] the importance of continued conversation with family and the medical providers regarding overall plan of care and treatment options, ensuring decisions are within the context of the patients values and GOCs.    We discuss hospice support at home as a potential option.  [name] is unsure how to proceed and tells me he needs time to consider the above conversation and talk to other  family members. He is also going to continue attempts to discuss situation with patient - he tells me mental status is much improved today  compared to yesterday and he is hopeful for continued improvement. He plans to speak further with Dr. Posey Pronto tomorrow.   Questions and concerns were addressed. The family was encouraged to call with questions or concerns.   Primary Decision Maker NEXT OF KIN - son - [name]   SUMMARY OF RECOMMENDATIONS   -son having difficulty with decision making - unsure if he should proceed with amputation or not, patient is saying she does not want it and son wants to honor this - DNR recommended but patient has requested full code in the past so son unsure - son requests time to consider conversation, talk to patient, and talk to other family members prior to making any decisions - son plans to follow up with Dr. Posey Pronto tomorrow  Code Status/Advance Care Planning: Full code      Primary Diagnoses: Present on Admission:  Physical deconditioning  Essential hypertension  Type 2 diabetes mellitus with stage 3 chronic kidney disease ([place])  Hypothyroidism (acquired)  Diabetic ulcer of right foot associated with type 2 diabetes mellitus, with fat layer exposed ([place])  CKD (chronic kidney disease), stage IV ([place])  Type 2 diabetes mellitus with diabetic neuropathic arthropathy ([place])  Osteomyelitis ([place])   I have reviewed the medical record, interviewed the patient and family, and examined the patient. The following aspects are pertinent.  Past Medical History:  Diagnosis Date   Chronic diastolic CHF (congestive heart failure), NYHA class 3 ([place]) 06/30/2019   Diabetes mellitus without complication (HCC)    Humerus fracture 04/20/2018   left   Hyperlipidemia    Hypertension    Hypothyroidism    Vertigo    several yrs ago   Wears dentures    partial upper and lower (loose)   Social History   Socioeconomic History   Marital status: Divorced    Spouse name: Not on file   Number of children: Not on file   Years of education: Not on file   Highest education level: Not on file  Occupational  History   Not on file  Tobacco Use   Smoking status: Never   Smokeless tobacco: Never  Vaping Use   Vaping Use: Never used  Substance and Sexual Activity   Alcohol use: No   Drug use: Never   Sexual activity: Not on file  Other Topics Concern   Not on file  Social History Narrative   Not on file   Social Determinants of Health   Financial Resource Strain: Not on file  Food Insecurity: Not on file  Transportation Needs: Not on file  Physical Activity: Not on file  Stress: Not on file  Social Connections: Not on file   Family History  Problem Relation Age of Onset   Breast cancer Neg Hx    Scheduled Meds:  (feeding supplement) PROSource Plus  30 mL Oral TID BM   amLODipine  10 mg Oral Daily   vitamin C  500 mg Oral BID   aspirin  81 mg Oral Daily   atorvastatin  40 mg Oral Daily   bisacodyl  10 mg Rectal Daily   Chlorhexidine Gluconate Cloth  6 each Topical Daily   collagenase   Topical Daily   donepezil  10 mg Oral QHS   enoxaparin (LOVENOX) injection  30 mg Subcutaneous Q24H   levothyroxine  50 mcg Oral Daily  multivitamin with minerals  1 tablet Oral Daily   polyethylene glycol  17 g Oral Daily   vitamin B-12  1,000 mcg Oral Daily   zinc sulfate  220 mg Oral Daily   Continuous Infusions: PRN Meds:.acetaminophen **OR** acetaminophen, morphine injection, ondansetron **OR** ondansetron (ZOFRAN) IV, polyethylene glycol, traMADol, traZODone No Known Allergies Review of Systems  Unable to perform ROS: Mental status change   Physical Exam Constitutional:      General: She is not in acute distress.    Appearance: She is ill-appearing.     Comments: lethargic  Pulmonary:     Effort: Pulmonary effort is normal.  Skin:    General: Skin is warm and dry.  Neurological:     Mental Status: She is disoriented.    Vital Signs: BP (!) 118/41 (BP Location: Right Arm)    Pulse 64    Temp 98.6 F (37 C)    Resp 20    Ht _0  (1.702 m)    Wt 54.4 kg    SpO2 98%    BMI  18.79 kg/m  Pain Scale: 0-10   Pain Score: 10-Worst pain ever   SpO2: SpO2: 98 % O2 Device:SpO2: 98 % O2 Flow Rate: .O2 Flow Rate (L/min): 4 L/min  IO: Intake/output summary:  Intake/Output Summary (Last 24 hours) at 05/04/2021 1303 Last data filed at 05/04/2021 0543 Gross per 24 hour  Intake --  Output 125 ml  Net -125 ml    LBM: Last BM Date: 05/03/21 Baseline Weight: Weight: 54.4 kg Most recent weight: Weight: 54.4 kg     Palliative Assessment/Data: PPS 20%    Juel Burrow, DNP, [facility] Palliative Medicine Team [phone] Pager: [phone]

## 2021-05-04 NOTE — TOC Progression Note (Signed)
Transition of Care [facility]) - Progression Note    Patient Details  Name: [name] [name] MRN: [id number] Date of Birth: 1946-02-15  Transition of Care [facility]) CM/SW [place], RN Phone Number: 05/04/2021, 9:42 AM  Clinical Narrative:   The patient is from home with her son who is her caregiver, She has a CNA at home daily for a few hours, It is recommended to get AKA, Son will discuss today, It is recommended to go to LTC, She does not have the Ins or funds to support this, TOC to follow and determine next step after they decide on the AKA         Expected Discharge Plan and Services                                                 Social Determinants of Health (SDOH) Interventions    Readmission Risk Interventions Readmission Risk Prevention Plan 06/01/2020 05/18/2020 11/19/2019  Transportation Screening Complete Complete Complete  PCP or Specialist Appt within 3-5 Days - Complete Complete  HRI or [place] - Complete Complete  Social Work Consult for [place] Planning/Counseling - Complete Complete  Palliative Care Screening - Not Applicable Complete  Medication Review Press photographer) Complete Complete Complete  PCP or Specialist appointment within 3-5 days of discharge Complete - -  SW Recovery Care/Counseling Consult Complete - -  [place] Not Applicable - -  Some recent data might be hidden

## 2021-05-04 NOTE — Consult Note (Signed)
[facility] Consult Note:05/04/2021    Date of Admission:  05/02/2021           Reason for Consult:  AKI   Referring Provider: Fritzi Mandes, MD Primary Care Provider: Pcp, No   History of Presenting Illness:  [name] [name] is a 76 y.o. female  Patient with multiple medical problems as outlined below Admitted with generalized deconditioning and weakness. Recent admission in December for extensive osteomyelitis of left foot.  Underwent revascularization and debridement.  Patient was recommended to have amputation but she is refusing. Son at bedside.  Patient is not able to provide much information but does confirm that she does not want amputation  Review of Systems: ROS-limited and not reliable  Past Medical History:  Diagnosis Date   Chronic diastolic CHF (congestive heart failure), NYHA class 3 ([place]) 06/30/2019   Diabetes mellitus without complication (HCC)    Humerus fracture 04/20/2018   left   Hyperlipidemia    Hypertension    Hypothyroidism    Vertigo    several yrs ago   Wears dentures    partial upper and lower (loose)    Social History   Tobacco Use   Smoking status: Never   Smokeless tobacco: Never  Vaping Use   Vaping Use: Never used  Substance Use Topics   Alcohol use: No   Drug use: Never    Family History  Problem Relation Age of Onset   Breast cancer Neg Hx      OBJECTIVE: Blood pressure (!) 118/41, pulse 64, temperature 98.6 F (37 C), resp. rate 20, height 5\' 7"  (1.702 m), weight 54.4 kg, SpO2 98 %.  Physical Exam  General appearance: Frail, elderly, laying in the bed HEENT: Moist oral mucous membranes, food stuck in the mouth Neck: No JVD Pulmonary: Decreased breath sounds at bases, [place] O2, normal effort Cardiovascular:irregular,  Abdo: soft, nt Extr: no edema; B/L feet in soft boot support Neuro: Lethargic, able to answer simple questions  Lab Results Lab Results  Component Value Date   WBC 12.2 (H)  05/02/2021   HGB 10.1 (L) 05/02/2021   HCT 32.6 (L) 05/02/2021   MCV 93.7 05/02/2021   PLT 277 05/02/2021    Lab Results  Component Value Date   CREATININE 1.87 (H) 05/02/2021   BUN 105 (H) 05/02/2021   NA 142 05/02/2021   K 4.7 05/02/2021   CL 115 (H) 05/02/2021   CO2 14 (L) 05/02/2021    Lab Results  Component Value Date   ALT 32 05/02/2021   AST 35 05/02/2021   ALKPHOS 75 05/02/2021   BILITOT 0.6 05/02/2021     Microbiology: Recent Results (from the past 240 hour(s))  Urine Culture     Status: None   Collection Time: 05/02/21 11:30 AM   Specimen: Urine, Catheterized  Result Value Ref Range Status   Specimen Description   Final    URINE, CATHETERIZED Performed at [facility], [address], [place], [place] [address]    Special Requests   Final    NONE Performed at [facility], [address], [place], [place] [address]    Culture   Final    NO GROWTH Performed at [facility], [place] [address] [place], [place] [address]    Report Status 05/04/2021 FINAL  Final  Resp Panel by RT-PCR (Flu A&B, Covid) Nasopharyngeal Swab     Status: Abnormal   Collection Time: 05/02/21  5:54 PM   Specimen: Nasopharyngeal  Swab; Nasopharyngeal(NP) swabs in vial transport medium  Result Value Ref Range Status   SARS Coronavirus 2 by RT PCR POSITIVE (A) NEGATIVE Final    Comment: (NOTE) SARS-CoV-2 target nucleic acids are DETECTED.  The SARS-CoV-2 RNA is generally detectable in upper respiratory specimens during the acute phase of infection. Positive results are indicative of the presence of the identified virus, but do not rule out bacterial infection or co-infection with other pathogens not detected by the test. Clinical correlation with patient history and other diagnostic information is necessary to determine patient infection status. The expected result is Negative.  Fact Sheet for Patients: EntrepreneurPulse.com.au  Fact Sheet  for Healthcare Providers: IncredibleEmployment.be  This test is not yet approved or cleared by the [place] FDA and  has been authorized for detection and/or diagnosis of SARS-CoV-2 by FDA under an Emergency Use Authorization (EUA).  This EUA will remain in effect (meaning this test can be used) for the duration of  the COVID-19 declaration under Section 564(b)(1) of the A ct, 21 U.S.C. section 360bbb-3(b)(1), unless the authorization is terminated or revoked sooner.     Influenza A by PCR NEGATIVE NEGATIVE Final   Influenza B by PCR NEGATIVE NEGATIVE Final    Comment: (NOTE) The Xpert Xpress SARS-CoV-2/FLU/RSV plus assay is intended as an aid in the diagnosis of influenza from Nasopharyngeal swab specimens and should not be used as a sole basis for treatment. Nasal washings and aspirates are unacceptable for Xpert Xpress SARS-CoV-2/FLU/RSV testing.  Fact Sheet for Patients: EntrepreneurPulse.com.au  Fact Sheet for Healthcare Providers: IncredibleEmployment.be  This test is not yet approved or cleared by the [place] FDA and has been authorized for detection and/or diagnosis of SARS-CoV-2 by FDA under an Emergency Use Authorization (EUA). This EUA will remain in effect (meaning this test can be used) for the duration of the COVID-19 declaration under Section 564(b)(1) of the Act, 21 U.S.C. section 360bbb-3(b)(1), unless the authorization is terminated or revoked.  Performed at [facility], [address] [place], [place] [address]     Medications: Scheduled Meds:  (feeding supplement) PROSource Plus  30 mL Oral TID BM   amLODipine  10 mg Oral Daily   vitamin C  500 mg Oral BID   aspirin  81 mg Oral Daily   atorvastatin  40 mg Oral Daily   bisacodyl  10 mg Rectal Daily   Chlorhexidine Gluconate Cloth  6 each Topical Daily   collagenase   Topical Daily   donepezil  10 mg Oral QHS   enoxaparin  (LOVENOX) injection  30 mg Subcutaneous Q24H   levothyroxine  50 mcg Oral Daily   multivitamin with minerals  1 tablet Oral Daily   polyethylene glycol  17 g Oral Daily   vitamin B-12  1,000 mcg Oral Daily   zinc sulfate  220 mg Oral Daily   Continuous Infusions:  sodium chloride      sodium bicarbonate (isotonic) infusion in sterile water     PRN Meds:.acetaminophen **OR** acetaminophen, morphine injection, ondansetron **OR** ondansetron (ZOFRAN) IV, polyethylene glycol, traMADol, traZODone  No Known Allergies  Urinalysis: Recent Labs    05/02/21 1130  COLORURINE YELLOW  LABSPEC <1.005*  PHURINE 5.0  GLUCOSEU NEGATIVE  HGBUR NEGATIVE  BILIRUBINUR NEGATIVE  KETONESUR TRACE*  PROTEINUR TRACE*  NITRITE NEGATIVE  LEUKOCYTESUR NEGATIVE      Imaging: US Venous Img Lower Bilateral (DVT)  Result Date: 05/02/2021 CLINICAL DATA:  Pain and swelling, shortness of breath EXAM: Bilateral LOWER EXTREMITY  VENOUS DOPPLER ULTRASOUND TECHNIQUE: Gray-scale sonography with compression, as well as color and duplex ultrasound, were performed to evaluate the deep venous system(s) from the level of the common femoral vein through the popliteal and proximal calf veins. COMPARISON:  None. FINDINGS: VENOUS Normal compressibility of the common femoral, superficial femoral, and popliteal veins, as well as the visualized calf veins. Right peroneal and posterior tibial veins are not adequately visualized for evaluation. Visualized portions of profunda femoral vein and great saphenous vein unremarkable. No filling defects to suggest DVT on grayscale or color Doppler imaging. Doppler waveforms show normal direction of venous flow, normal respiratory plasticity and response to augmentation. Limited views of the contralateral common femoral vein are unremarkable. OTHER There is diffuse edema in subcutaneous plane without loculated fluid collections. Limitations: Diffuse edema in the subcutaneous plane and patient's  inability to cooperate optimally for positioning limits examination. IMPRESSION: There is no evidence of deep venous thrombosis in the visualized major deep veins in both lower extremities. Right peroneal and right posterior tibial veins are not adequately visualized for evaluation. If there are continued symptoms, short-term follow-up venous Doppler examination may be considered. Electronically Signed   By: Elmer Picker M.D.   On: 05/02/2021 19:07      Assessment/Plan:  COURTLAND COPPA is a 76 y.o. female with medical problems of   type 2 diabetes, hypertension, chronic kidney disease, hypothyroidism, diabetic foot ulcer, osteomyelitis, atheromatous arterial calcifications, hiatal hernia was admitted on 05/02/2021 for :  Physical deconditioning [R53.81] Failure to thrive in adult [R62.7] Acute hypoxemic respiratory failure (HCC) [J96.01] Osteomyelitis of left foot, unspecified type ([place]) [M86.9]  #Acute kidney injury. #Chronic kidney disease stage IIIb.  Baseline creatinine 1.5/GFR 37 from March 22, 2021  Urinalysis from January 25-trace ketones, 0-5 WBCs, 0-5 RBCs, negative for hemoglobin, trace protein. Creatinine from January 25 is elevated at 1.87 We will repeat renal panel  Imaging: Bilateral intrarenal arterial calcifications.  No definite calculi.  -AKI likely multifactorial including secondary to infection/osteomyelitis, hypotension Recommendation: Avoid hypotension, nephrotoxins including IV contrast, nonsteroidals and aminoglycosides Blood pressure is relatively low.  Will place hold parameters on amlodipine.  Do not administer if systolic blood pressure less than 140. With multiple underlying debilitating medical conditions and patient's refusal for AKA, likely not candidate for chronic dialysis.  #Metabolic acidosis Suggest IV bicarbonate infusion  #COVID-positive Respiratory isolation/airborne precautions Management as per primary team  #Osteomyelitis of left  foot Above-the-knee amputation recommended Patient refusing  #Diabetes type 2 with CKD Lab Results  Component Value Date   HGBA1C 6.7 (H) 03/20/2021  Insulin-dependent at home NovoLog 70/30    [name] [name] 05/04/21

## 2021-05-04 NOTE — Progress Notes (Addendum)
[place] [facility] [employer] NAME: [name] [name]    MR#:  [id number]  DATE OF BIRTH:  January 21, 1946  SUBJECTIVE:  came in with generalized weakness deconditioning and poor PO intake. Lives with son who helps manage wounds along with home health. Met with son [name] [name] and palliative care in the room. Patient was having a lot of pain was not able to participate in conversation although she did tell me she doesn't want amputation of her left foot. Son was tearful.  REVIEW OF SYSTEMS:   Review of Systems  Unable to perform ROS: Mental status change  Tolerating Diet: Tolerating PT:   DRUG ALLERGIES:  No Known Allergies  VITALS:  Blood pressure (!) 118/41, pulse 64, temperature 98.6 F (37 C), resp. rate 20, height _0  (1.702 m), weight 54.4 kg, SpO2 98 %.  PHYSICAL EXAMINATION:   Physical Exam  GENERAL:  76 y.o.-year-old patient lying in the bed with no acute distress.  Weak, deconditioned  LUNGS: Normal breath sounds bilaterally, no wheezing, rales, rhonchi.  CARDIOVASCULAR: S1, S2 normal. No murmurs, rubs, or gallops.  ABDOMEN: Soft, nontender, nondistended. Bowel sounds present.  EXTREMITIES: from Jan 19th, 2023  NEUROLOGIC: nonfocal PSYCHIATRIC:  patient is alert  SKIN:  Pressure Injury 12/04/19 Ankle Anterior;Right Unstageable - Full thickness tissue loss in which the base of the injury is covered by slough (yellow, tan, gray, green or Schrupp) and/or eschar (tan, Hornstein or black) in the wound bed. (Active)  12/04/19 1837  Location: Ankle  Location Orientation: Anterior;Right  Staging: Unstageable - Full thickness tissue loss in which the base of the injury is covered by slough (yellow, tan, gray, green or Riepe) and/or eschar (tan, [place]urdo or black) in the wound bed.  Wound Description (Comments):   Present on Admission:      Pressure Injury 05/02/21 Coccyx Right;Left Unstageable - Full thickness tissue loss in which the base of the injury is  covered by slough (yellow, tan, gray, green or [place]) and/or eschar (tan, Detert or black) in the wound bed. black eschar over most ofwo (Active)  05/02/21 1000  Location: Coccyx  Location Orientation: Right;Left  Staging: Unstageable - Full thickness tissue loss in which the base of the injury is covered by slough (yellow, tan, gray, green or Hahne) and/or eschar (tan, Fre[place] or black) in the wound bed.  Wound Description (Comments): black eschar over most ofwound  Present on Admission: Yes     Pressure Injury 05/03/21 Right Stage 2 -  Partial thickness loss of dermis presenting as a shallow open injury with a red, pink wound bed without slough. (Active)  05/03/21 0830  Location:   Location Orientation: Right  Staging: Stage 2 -  Partial thickness loss of dermis presenting as a shallow open injury with a red, pink wound bed without slough.  Wound Description (Comments):   Present on Admission: Yes     Pressure Injury 05/03/21 Back Posterior;Upper Stage 2 -  Partial thickness loss of dermis presenting as a shallow open injury with a red, pink wound bed without slough. Pink wound bed (Active)  05/03/21 1815  Location: Back  Location Orientation: Posterior;Upper  Staging: Stage 2 -  Partial thickness loss of dermis presenting as a shallow open injury with a red, pink wound bed without slough.  Wound Description (Comments): Pink wound bed  Present on Admission: Yes    LABORATORY PANEL:  CBC Recent Labs  Lab 05/02/21 1347  WBC 12.2*  HGB 10.1*  HCT 32.6*  PLT 277     Chemistries  Recent Labs  Lab 05/02/21 1155  NA 142  K 4.7  CL 115*  CO2 14*  GLUCOSE 127*  BUN 105*  CREATININE 1.87*  CALCIUM 10.4*  AST 35  ALT 32  ALKPHOS 75  BILITOT 0.6    Cardiac Enzymes No results for input(s): TROPONINI in the last 168 hours. RADIOLOGY:  CT ABDOMEN PELVIS WO CONTRAST  Result Date: 05/02/2021 CLINICAL DATA:  Acute left lower quadrant abdominal pain. Large sacral decubitus  ulcer. EXAM: CT ABDOMEN AND PELVIS WITHOUT CONTRAST TECHNIQUE: Multidetector CT imaging of the abdomen and pelvis was performed following the standard protocol without IV contrast. RADIATION DOSE REDUCTION: This exam was performed according to the departmental dose-optimization program which includes automated exposure control, adjustment of the mA and/or kV according to patient size and/or use of iterative reconstruction technique. COMPARISON:  04/02/2020 FINDINGS: Lower chest: Minimal left lower lobe atelectasis. Normal sized heart. Coronary artery calcifications. Hepatobiliary: Distended gallbladder with probable dependent sludge. Small liver with no focal abnormality seen. Pancreas: Unremarkable. No pancreatic ductal dilatation or surrounding inflammatory changes. Spleen: Small calcified granulomata/internal arterial calcifications. Normal in size and shape. Adrenals/Urinary Tract: Unremarkable adrenal glands. Bilateral intrarenal arterial calcifications with no definite calculi seen. Unremarkable ureters. Foley catheter in the urinary bladder with a small amount of associated air in the bladder. No significant urine in the bladder. Stomach/Bowel: Small hiatal hernia. Large amount of stool mixed with contrast in the rectum, causing rectal distension without wall thickening. There is also some stool extending through the anus and onto the table. The remainder of the colon is unremarkable as are the small bowel and appendix. Vascular/Lymphatic: Extensive atheromatous arterial calcifications without aneurysm. No enlarged lymph nodes. Reproductive: Uterus and bilateral adnexa are unremarkable. Other: No abdominal wall hernia or abnormality. No abdominopelvic ascites. Bilateral subcutaneous edema. Musculoskeletal: Lumbar and lower thoracic spine degenerative changes with stable associated subluxations. No fractures. Poorly defined sacral decubitus ulcer extending close to the underlying sacrococcygeal area without  bone destruction or periosteal reaction. No soft tissue gas. IMPRESSION: 1. No acute abnormality. Specifically, no explanation for the patient's left lower quadrant abdomen pain. 2. Extensive atheromatous arterial calcifications, including the coronary arteries. 3. Small hiatal hernia. 4. Large amount of stool distending the rectum with evidence of fecal incontinence. Electronically Signed   By: Claudie Revering M.D.   On: 05/02/2021 14:29   US Venous Img Lower Bilateral (DVT)  Result Date: 05/02/2021 CLINICAL DATA:  Pain and swelling, shortness of breath EXAM: Bilateral LOWER EXTREMITY VENOUS DOPPLER ULTRASOUND TECHNIQUE: Gray-scale sonography with compression, as well as color and duplex ultrasound, were performed to evaluate the deep venous system(s) from the level of the common femoral vein through the popliteal and proximal calf veins. COMPARISON:  None. FINDINGS: VENOUS Normal compressibility of the common femoral, superficial femoral, and popliteal veins, as well as the visualized calf veins. Right peroneal and posterior tibial veins are not adequately visualized for evaluation. Visualized portions of profunda femoral vein and great saphenous vein unremarkable. No filling defects to suggest DVT on grayscale or color Doppler imaging. Doppler waveforms show normal direction of venous flow, normal respiratory plasticity and response to augmentation. Limited views of the contralateral common femoral vein are unremarkable. OTHER There is diffuse edema in subcutaneous plane without loculated fluid collections. Limitations: Diffuse edema in the subcutaneous plane and patient's inability to cooperate optimally for positioning limits examination. IMPRESSION: There is no evidence of deep venous thrombosis in the visualized  major deep veins in both lower extremities. Right peroneal and right posterior tibial veins are not adequately visualized for evaluation. If there are continued symptoms, short-term follow-up venous  Doppler examination may be considered. Electronically Signed   By: Elmer Picker M.D.   On: 05/02/2021 19:07   ASSESSMENT AND PLAN:  MIREYA MEDITZ is a 76 y.o. female with hx of hypertension, type 2 diabetes, hypothyroidism, CKD stage IV, CHF, multiple pressure ulcers, and osteomyelitis, who presents from home for weakness.  Patient recently admitted from December 13 to March 30, 2021.  She was admitted with extensive osteomyelitis of the left foot.  She underwent revascularization as well as debridement.   X-ray of the left foot showed ongoing findings consistent with osteomyelitis.   CT abdomen and pelvis without contrast was obtained for report of some left lower quadrant abdominal pain, which showed no acute findings with exception of large amount of stool in the rectum.  Acute metabolic encephalopathy multifactorial in the setting of poor nutrition, chronic osteomyelitis, elevated BUN creatinine/metabolic acidosis, acute on chronic kidney disease stage IIIB -- consult nephrology Dr. Candiss Norse-- recommends sterile water with 3 amps of bicarb -- monitor input output -- avoid nephrotoxic agents  Chronic left lower extremity osteomyelitis --Patient is status post 6-week course of antibiotics for osteomyelitis.   --Last spoke with ID on January 19, at that time note from video visit reports need for wound debridement.   -- Discussed with vascular surgery Dr. Lucky Cowboy. Recommends amputation. -- Spoke with patient's son [name] [name] and he will discuss with mother about amputation as well.  --1/27-- spoke with son [name] [name], patient and palliative care Shieh in the room. Discussed at length risk and complications involved with surgery/amputation. Patient so far is refusing to surgery. Son wants to honor her wishes. I have discussed with patient and son that I will revisit tomorrow in the meantime son will be talking with other family members.  Sacral ulcer is in on admission Consult wound nurse--  recommendations note  Physical deconditioning failure to thrive/poor PO intake Per discussion with patient's son she has had a significant decline in her physical conditioning since her last admission.  Will need assessment and possible SNF placement. - dysphagia 2 diet based on recent SLP recommendations - palliative care consultation-- appreciated.   B12 deficiency dementia - Daily B12 supplementation - Continue donepezil  hypoxia noted in the emergency room. -- Patient does not seem to be in respiratory distress. -- Chest x-ray no acute cardiopulmonary abnormality -- unable to do VQ scans since patient could not tolerate. -- Lower extremity ultrasound shows no DVT --sats 97% on RA  COVID positive, incidental -- no fever no respiratory distress  Family communication : son [name] [name] at bedside Consults : vascular,palliative CODE STATUS: full code DVT Prophylaxis : enoxaparin Level of care: Med-Surg Status is: Inpatient  Remains inpatient appropriate because: chronic osteomyelitis lower extremity. Patient likely will need amputation. Vascular consultation noted    TOTAL TIME TAKING CARE OF THIS PATIENT: 25 minutes.  >50% time spent on counselling and coordination of care  Note: This dictation was prepared with Dragon dictation along with smaller phrase technology. Any transcriptional errors that result from this process are unintentional.  Fritzi Mandes M.D    [facility]   CC: Primary care physician; Pcp, No

## 2021-05-05 LAB — RENAL FUNCTION PANEL
Albumin: 2.1 g/dL — ABNORMAL LOW (ref 3.5–5.0)
Anion gap: 10 (ref 5–15)
BUN: 116 mg/dL — ABNORMAL HIGH (ref 8–23)
CO2: 19 mmol/L — ABNORMAL LOW (ref 22–32)
Calcium: 9.5 mg/dL (ref 8.9–10.3)
Chloride: 115 mmol/L — ABNORMAL HIGH (ref 98–111)
Creatinine, Ser: 2.19 mg/dL — ABNORMAL HIGH (ref 0.44–1.00)
GFR, Estimated: 23 mL/min — ABNORMAL LOW (ref >60)
Glucose, Bld: 157 mg/dL — ABNORMAL HIGH (ref 70–99)
Phosphorus: 2.5 mg/dL (ref 2.5–4.6)
Potassium: 4.2 mmol/L (ref 3.5–5.1)
Sodium: 144 mmol/L (ref 135–145)

## 2021-05-05 LAB — GLUCOSE, CAPILLARY
Glucose-Capillary: 135 mg/dL — ABNORMAL HIGH (ref 70–99)
Glucose-Capillary: 200 mg/dL — ABNORMAL HIGH (ref 70–99)
Glucose-Capillary: 133 mg/dL — ABNORMAL HIGH (ref 70–99)
Glucose-Capillary: 167 mg/dL — ABNORMAL HIGH (ref 70–99)

## 2021-05-05 MED ORDER — GUAIFENESIN-DM 100-10 MG/5ML PO SYRP
5.0000 mL | ORAL_SOLUTION | ORAL | Status: DC | PRN
  Filled 2021-05-05: qty 5

## 2021-05-05 NOTE — Progress Notes (Signed)
Pt continues on Dys 1 diet and is tolerating but Po's continue to be poor. Continue with current diet. ST to follow and reassess with more solid consistencies as appropriate.

## 2021-05-05 NOTE — Progress Notes (Signed)
[name] [name]  MRN: [id number]  DOB/AGE: 1945/10/21 76 y.o.  Primary Care Physician:Pcp, No  Admit date: 05/02/2021  Chief Complaint:  Chief Complaint  Patient presents with   Weakness   Fatigue    S-Pt presented on  05/02/2021 with  Chief Complaint  Patient presents with   Weakness   Fatigue   Patient is a 76 year old African-American. female  With a past medical history of diabetes mellitus, chronic osteomyelitis, hypertension Patient is now admitted with generalized deconditioning and weakness. Patient had a recent admission in December for extensive osteomyelitis of left foot.  Patient underwent revascularization and debridement.  Patient was recommended to have amputation but she is refusing. Patient is not able to provide much information but does confirm that she does not want amputation Patient was seen today on first floor patient resting comfortably in the bed Patient does not offer any  complaints   Medications  (feeding supplement) PROSource Plus  30 mL Oral TID BM   amLODipine  10 mg Oral Daily   vitamin C  500 mg Oral BID   aspirin  81 mg Oral Daily   atorvastatin  40 mg Oral Daily   bisacodyl  10 mg Rectal Daily   Chlorhexidine Gluconate Cloth  6 each Topical Daily   collagenase   Topical Daily   donepezil  10 mg Oral QHS   enoxaparin (LOVENOX) injection  30 mg Subcutaneous Q24H   levothyroxine  50 mcg Oral Daily   multivitamin with minerals  1 tablet Oral Daily   polyethylene glycol  17 g Oral Daily   vitamin B-12  1,000 mcg Oral Daily   zinc sulfate  220 mg Oral Daily         JGG:EZMOQH to get much data.  Physical Exam: Vital signs in last 24 hours: Temp:  [98.3 F (36.8 C)-98.6 F (37 C)] 98.3 F (36.8 C) (01/28 0530) Pulse Rate:  [40-94] 94 (01/28 0530) Resp:  [14-20] 14 (01/28 0530) BP: (101-133)/(41-76) 133/68 (01/28 0530) SpO2:  [97 %-100 %] 99 % (01/28 0530) Weight change:  Last BM Date: 05/04/21  Intake/Output from previous  day: 01/27 0701 - 01/28 0700 In: 893.2 [I.V.:893.2] Out: -  No intake/output data recorded.   Physical Exam:  General- pt is awake,alert, Frail, deconditioned  Resp- No acute REsp distress,  NO Rhonchi  CVS- S1S2 regular in rate and rhythm  GIT- BS+, soft, Non tender , Non distended  EXT- No LE Edema,  No Cyanosis Patient has booties around her feet Patient does have a history of chronic osteomyelitis   Lab Results:  CBC  Recent Labs    05/02/21 1347  WBC 12.2*  HGB 10.1*  HCT 32.6*  PLT 277    BMET  Recent Labs    05/04/21 1505 05/05/21 0554  NA 145 144  K 4.3 4.2  CL 115* 115*  CO2 18* 19*  GLUCOSE 177* 157*  BUN 77* 116*  CREATININE 2.10* 2.19*  CALCIUM 9.9 9.5      Most recent Creatinine trend  Lab Results  Component Value Date   CREATININE 2.19 (H) 05/05/2021   CREATININE 2.10 (H) 05/04/2021   CREATININE 1.87 (H) 05/02/2021      MICRO   Recent Results (from the past 240 hour(s))  Urine Culture     Status: None   Collection Time: 05/02/21 11:30 AM   Specimen: Urine, Catheterized  Result Value Ref Range Status   Specimen Description   Final    URINE, CATHETERIZED  Performed at [facility], [address] [place], [place] [address]    Special Requests   Final    NONE Performed at [facility], [address] [place], [place] [address]    Culture   Final    NO GROWTH Performed at [facility], [place] [address], [place], [place] [address]    Report Status 05/04/2021 FINAL  Final  Resp Panel by RT-PCR (Flu A&B, Covid) Nasopharyngeal Swab     Status: Abnormal   Collection Time: 05/02/21  5:54 PM   Specimen: Nasopharyngeal Swab; Nasopharyngeal(NP) swabs in vial transport medium  Result Value Ref Range Status   SARS Coronavirus 2 by RT PCR POSITIVE (A) NEGATIVE Final    Comment: (NOTE) SARS-CoV-2 target nucleic acids are DETECTED.  The SARS-CoV-2 RNA is generally detectable in upper respiratory specimens  during the acute phase of infection. Positive results are indicative of the presence of the identified virus, but do not rule out bacterial infection or co-infection with other pathogens not detected by the test. Clinical correlation with patient history and other diagnostic information is necessary to determine patient infection status. The expected result is Negative.  Fact Sheet for Patients: EntrepreneurPulse.com.au  Fact Sheet for Healthcare Providers: IncredibleEmployment.be  This test is not yet approved or cleared by the [place] FDA and  has been authorized for detection and/or diagnosis of SARS-CoV-2 by FDA under an Emergency Use Authorization (EUA).  This EUA will remain in effect (meaning this test can be used) for the duration of  the COVID-19 declaration under Section 564(b)(1) of the A ct, 21 U.S.C. section 360bbb-3(b)(1), unless the authorization is terminated or revoked sooner.     Influenza A by PCR NEGATIVE NEGATIVE Final   Influenza B by PCR NEGATIVE NEGATIVE Final    Comment: (NOTE) The Xpert Xpress SARS-CoV-2/FLU/RSV plus assay is intended as an aid in the diagnosis of influenza from Nasopharyngeal swab specimens and should not be used as a sole basis for treatment. Nasal washings and aspirates are unacceptable for Xpert Xpress SARS-CoV-2/FLU/RSV testing.  Fact Sheet for Patients: EntrepreneurPulse.com.au  Fact Sheet for Healthcare Providers: IncredibleEmployment.be  This test is not yet approved or cleared by the [place] FDA and has been authorized for detection and/or diagnosis of SARS-CoV-2 by FDA under an Emergency Use Authorization (EUA). This EUA will remain in effect (meaning this test can be used) for the duration of the COVID-19 declaration under Section 564(b)(1) of the Act, 21 U.S.C. section 360bbb-3(b)(1), unless the authorization is terminated  or revoked.  Performed at [facility], [address] [place], [place] [address]          Impression:  [name] [name] is a 76 y.o. female with medical problems of   type 2 diabetes, hypertension, chronic kidney disease, hypothyroidism, diabetic foot ulcer, osteomyelitis, atheromatous arterial calcifications, hiatal hernia was admitted on 05/02/2021 for :   Physical deconditioning [R53.81] Failure to thrive in adult [R62.7] Acute hypoxemic respiratory failure (HCC) [J96.01] Osteomyelitis of left foot, unspecified type ([place]) [M86.9]  1)Renal    AKI secondary to multiple factors Patient has AKI secondary to current SIRS-infection/osteomyelitis Hypotension Patient has AKI secondary to ATN Patient has AKI on CKD Patient has CKD stage IIIb Patient has CKD secondary diabetes mellitus Patient has CKD since 2013-patient creatinine was 1.9 on April 13, 2011. Patient CKD progression has been marked with multiple episodes of AKI. Patient creatinine is currently at plateau . No acute need for renal placement therapy  2)HTN    Blood pressure is stable    3)Anemia of chronic disease  CBC Latest Ref Rng & Units 05/02/2021 03/29/2021 03/28/2021  WBC 4.0 - 10.5 K/uL 12.2(H) 8.4 7.5  Hemoglobin 12.0 - 15.0 g/dL 10.1(L) 8.5(L) 9.6(L)  Hematocrit 36.0 - 46.0 % 32.6(L) 26.6(L) 29.1(L)  Platelets 150 - 400 K/uL 277 202 200       HGb at goal (9--11)   4) Secondary hyperparathyroidism -CKD Mineral-Bone Disorder    Lab Results  Component Value Date   CALCIUM 9.5 05/05/2021   PHOS 2.5 05/05/2021    Secondary Hyperparathyroidism absent.  Phosphorus at goal.   5) proteinuria Patient has proteinuria secondary to diabetic nephropathy Patient has had proteinuria going back to 2017   6) Electrolytes   BMP Latest Ref Rng & Units 05/05/2021 05/04/2021 05/02/2021  Glucose 70 - 99 mg/dL 157(H) 177(H) 127(H)  BUN 8 - 23 mg/dL 116(H) 77(H) 105(H)  Creatinine 0.44 -  1.00 mg/dL 2.19(H) 2.10(H) 1.87(H)  Sodium 135 - 145 mmol/L 144 145 142  Potassium 3.5 - 5.1 mmol/L 4.2 4.3 4.7  Chloride 98 - 111 mmol/L 115(H) 115(H) 115(H)  CO2 22 - 32 mmol/L 19(L) 18(L) 14(L)  Calcium 8.9 - 10.3 mg/dL 9.5 9.9 10.4(H)     Sodium Normonatremic   Potassium Normokalemic    7) chronic metabolic acidosis Patient bicarb level has improved   8) chronic osteomyelitis Patient has completed 6 weeks of IV antibiotics Patient has been seen by vascular surgery, ID and primary team Multiple physicians/teams have discussed need/risk/benefit of amputation Patient does not wish for amputation.      Plan:  We will continue current treatment plan      Asli Tokarski s [name] 05/05/2021, 8:19 AM

## 2021-05-05 NOTE — Progress Notes (Addendum)
[facility] NAME: [name] [name]    MR#:  [id number]  DATE OF BIRTH:  [birth year]  SUBJECTIVE:  came in with generalized weakness deconditioning and poor PO intake. Lives with son who helps manage wounds along with home health.  Patient awake trying to slip on the protein drink. Not able to participate much in conversation. REVIEW OF SYSTEMS:   Review of Systems  Unable to perform ROS: Mental status change  Tolerating Diet: poor PO diet Tolerating PT: NO  DRUG ALLERGIES:  No Known Allergies  VITALS:  Blood pressure 120/60, pulse 94, temperature 98.4 F (36.9 C), resp. rate 16, height 5\' 7"  (1.702 m), weight 54.4 kg, SpO2 96 %.  PHYSICAL EXAMINATION:   Physical Exam  GENERAL:  76 y.o.-year-old patient lying in the bed with no acute distress.  Weak, deconditioned  LUNGS: Normal breath sounds bilaterally, no wheezing, rales, rhonchi.  CARDIOVASCULAR: S1, S2 normal. No murmurs, rubs, or gallops.  ABDOMEN: Soft, nontender, nondistended. Bowel sounds present.  EXTREMITIES: from Jan 19th, 2023  NEUROLOGIC: nonfocal PSYCHIATRIC:  patient is alert  SKIN:  Pressure Injury 12/04/19 Ankle Anterior;Right Unstageable - Full thickness tissue loss in which the base of the injury is covered by slough (yellow, tan, gray, green or [place]) and/or eschar (tan, Kutzer or black) in the wound bed. (Active)  12/04/19 1837  Location: Ankle  Location Orientation: Anterior;Right  Staging: Unstageable - Full thickness tissue loss in which the base of the injury is covered by slough (yellow, tan, gray, green or [place]) and/or eschar (tan, [place]und[place] or black) in the wound bed.  Wound Description (Comments):   Present on Admission:      Pressure Injury 05/02/21 Coccyx Right;Left Unstageable - Full thickness tissue loss in which the base of the injury is covered by slough (yellow, tan, gray, green or [place]) and/or eschar (tan, [place] or black) in the wound bed.  black eschar over most ofwo (Active)  05/02/21 1000  Location: Coccyx  Location Orientation: Right;Left  Staging: Unstageable - Full thickness tissue loss in which the base of the injury is covered by slough (yellow, tan, gray, green or [place]) and/or eschar (tan, Pezzullo or black) in the wound bed.  Wound Description (Comments): black eschar over most ofwound  Present on Admission: Yes     Pressure Injury 05/03/21 Right Stage 2 -  Partial thickness loss of dermis presenting as a shallow open injury with a red, pink wound bed without slough. (Active)  05/03/21 0830  Location:   Location Orientation: Right  Staging: Stage 2 -  Partial thickness loss of dermis presenting as a shallow open injury with a red, pink wound bed without slough.  Wound Description (Comments):   Present on Admission: Yes     Pressure Injury 05/03/21 Back Posterior;Upper Stage 2 -  Partial thickness loss of dermis presenting as a shallow open injury with a red, pink wound bed without slough. Pink wound bed (Active)  05/03/21 1815  Location: Back  Location Orientation: Posterior;Upper  Staging: Stage 2 -  Partial thickness loss of dermis presenting as a shallow open injury with a red, pink wound bed without slough.  Wound Description (Comments): Pink wound bed  Present on Admission: Yes    LABORATORY PANEL:  CBC Recent Labs  Lab 05/02/21 1347  WBC 12.2*  HGB 10.1*  HCT 32.6*  PLT 277     Chemistries  Recent Labs  Lab 05/02/21 1155 05/04/21 1505 05/05/21 0554  NA 142   < > 144  K 4.7   < > 4.2  CL 115*   < > 115*  CO2 14*   < > 19*  GLUCOSE 127*   < > 157*  BUN 105*   < > 116*  CREATININE 1.87*   < > 2.19*  CALCIUM 10.4*   < > 9.5  AST 35  --   --   ALT 32  --   --   ALKPHOS 75  --   --   BILITOT 0.6  --   --    < > = values in this interval not displayed.    Cardiac Enzymes No results for input(s): TROPONINI in the last 168 hours. RADIOLOGY:  No results found. ASSESSMENT AND PLAN:   [name] [name] is a 76 y.o. female with hx of hypertension, type 2 diabetes, hypothyroidism, CKD stage IV, CHF, multiple pressure ulcers, and osteomyelitis, who presents from home for weakness.  Patient recently admitted from December 13 to March 30, 2021.  She was admitted with extensive osteomyelitis of the left foot.  She underwent revascularization as well as debridement.   X-ray of the left foot showed ongoing findings consistent with osteomyelitis.   CT abdomen and pelvis without contrast was obtained for report of some left lower quadrant abdominal pain, which showed no acute findings with exception of large amount of stool in the rectum.  Acute metabolic encephalopathy multifactorial in the setting of poor nutrition, chronic osteomyelitis, elevated BUN creatinine/metabolic acidosis, acute on chronic kidney disease stage IIIB -- consult nephrology Dr. Candiss Norse-- recommends sterile water with 3 amps of bicarb -- monitor input output -- avoid nephrotoxic agents -- creatinine rising along with BUN. Uremia likely causing encephalopathy/confusion  Chronic left lower extremity osteomyelitis --Patient is status post 6-week course of antibiotics for osteomyelitis.   --Last spoke with ID on January 19, at that time note from video visit reports need for wound debridement.   -- Discussed with vascular surgery Dr. Lucky Cowboy. Recommends amputation. -- Spoke with patient's son [name] [name] and he will discuss with mother about amputation as well.  --1/27-- spoke with son [name] [name], patient and palliative care Shieh in the room. Discussed at length risk and complications involved with surgery/amputation. Patient so far is refusing to surgery. Son wants to honor her wishes. I have discussed with patient and son that I will revisit tomorrow in the meantime son will be talking with other family members. -- Patient refused amputation today as well. Son aware of it.  Sacral ulcer present  on admission Consult wound  nurse-- recommendations note  Physical deconditioning failure to thrive/poor PO intake Per discussion with patient's son she has had a significant decline in her physical conditioning since her last admission.  Will need assessment and possible SNF placement. - dysphagia 2 diet based on recent SLP recommendations - palliative care consultation-- appreciated.   B12 deficiency dementia - Daily B12 supplementation - Continue donepezil  hypoxia noted in the emergency room. -- Patient does not seem to be in respiratory distress. -- Chest x-ray no acute cardiopulmonary abnormality -- unable to do VQ scans since patient could not tolerate. -- Lower extremity ultrasound shows no DVT --sats 97% on RA  COVID positive, incidental -- no fever no respiratory distress  Nutrition Status: Nutrition Problem: Moderate Malnutrition Etiology: chronic illness (dementia) Signs/Symptoms: mild fat depletion, moderate fat depletion, mild muscle depletion, moderate muscle depletion Interventions: MVI, Prostat, Hormel Shake    Family communication : son [name] [name] at  bedside Consults : vascular,palliative CODE STATUS: full code DVT Prophylaxis : enoxaparin Level of care: Med-Surg Status is: Inpatient  Remains inpatient appropriate because: chronic osteomyelitis lower extremity. Patient likely will need amputation. Vascular consultation noted  1/28-- discussed with son [name] [name] again on the phone. Overall poor prognosis with multiple comorbidities and current ongoing medical issues. Discussed discharge options. Patient's son would like mother to go home. TOC for discharge planning. Discussed code status again. [name] [name] will let me know about it tomorrow. Given overall poor prognosis discussed DNR. Will await final word from son.   TOTAL TIME TAKING CARE OF THIS PATIENT: 25 minutes.  >50% time spent on counselling and coordination of care  Note: This dictation was prepared with Dragon dictation along with  smaller phrase technology. Any transcriptional errors that result from this process are unintentional.  Fritzi Mandes M.D    [facility]   CC: Primary care physician; Pcp, No

## 2021-05-05 NOTE — Plan of Care (Signed)
Patient with very poor oral intake. Drank 2 juices, 1 Pro Source and applesauce throughout the day. Full assist with feed. Will continue to encourage patient with oral intake.    Problem: Education: Goal: Knowledge of General Education information will improve Description: Including pain rating scale, medication(s)/side effects and non-pharmacologic comfort measures Outcome: Progressing   Problem: Health Behavior/Discharge Planning: Goal: Ability to manage health-related needs will improve Outcome: Progressing   Problem: Clinical Measurements: Goal: Ability to maintain clinical measurements within normal limits will improve Outcome: Progressing Goal: Will remain free from infection Outcome: Progressing Goal: Diagnostic test results will improve Outcome: Progressing Goal: Respiratory complications will improve Outcome: Progressing Goal: Cardiovascular complication will be avoided Outcome: Progressing   Problem: Activity: Goal: Risk for activity intolerance will decrease Outcome: Progressing   Problem: Nutrition: Goal: Adequate nutrition will be maintained Outcome: Progressing   Problem: Coping: Goal: Level of anxiety will decrease Outcome: Progressing   Problem: Elimination: Goal: Will not experience complications related to bowel motility Outcome: Progressing Goal: Will not experience complications related to urinary retention Outcome: Progressing   Problem: Pain Managment: Goal: General experience of comfort will improve Outcome: Progressing   Problem: Safety: Goal: Ability to remain free from injury will improve Outcome: Progressing   Problem: Skin Integrity: Goal: Risk for impaired skin integrity will decrease Outcome: Progressing

## 2021-05-06 LAB — RENAL FUNCTION PANEL
Albumin: 2 g/dL — ABNORMAL LOW (ref 3.5–5.0)
Anion gap: 10 (ref 5–15)
BUN: 97 mg/dL — ABNORMAL HIGH (ref 8–23)
CO2: 23 mmol/L (ref 22–32)
Calcium: 8.8 mg/dL — ABNORMAL LOW (ref 8.9–10.3)
Chloride: 110 mmol/L (ref 98–111)
Creatinine, Ser: 2.21 mg/dL — ABNORMAL HIGH (ref 0.44–1.00)
GFR, Estimated: 23 mL/min — ABNORMAL LOW (ref >60)
Glucose, Bld: 159 mg/dL — ABNORMAL HIGH (ref 70–99)
Phosphorus: 2.4 mg/dL — ABNORMAL LOW (ref 2.5–4.6)
Potassium: 4.1 mmol/L (ref 3.5–5.1)
Sodium: 143 mmol/L (ref 135–145)

## 2021-05-06 LAB — GLUCOSE, CAPILLARY
Glucose-Capillary: 139 mg/dL — ABNORMAL HIGH (ref 70–99)
Glucose-Capillary: 123 mg/dL — ABNORMAL HIGH (ref 70–99)

## 2021-05-06 MED ORDER — ORAL CARE MOUTH RINSE
15.0000 mL | Freq: Two times a day (BID) | OROMUCOSAL | Status: DC
  Administered 2021-05-06 – 2021-05-08 (×5): 15 mL via OROMUCOSAL

## 2021-05-06 MED ORDER — CHLORHEXIDINE GLUCONATE 0.12 % MT SOLN
15.0000 mL | Freq: Two times a day (BID) | OROMUCOSAL | Status: DC
  Administered 2021-05-06 – 2021-05-09 (×6): 15 mL via OROMUCOSAL
  Filled 2021-05-06 (×7): qty 15

## 2021-05-06 NOTE — Progress Notes (Signed)
[facility] [place] NAME: [name] [name]    MR#:  [id number]  DATE OF BIRTH:  [birth year]  SUBJECTIVE:  came in with generalized weakness deconditioning and poor PO intake. Lives with son   Patient awake trying to sip on the protein drink. Not able to participate much in conversation. Weak, deconditioned  UOP not documented IVF d/ced by Nephrology REVIEW OF SYSTEMS:   Review of Systems  Unable to perform ROS: Mental status change  Tolerating Diet: poor PO diet Tolerating PT: NO  DRUG ALLERGIES:  No Known Allergies  VITALS:  Blood pressure 124/72, pulse 87, temperature 98.5 F (36.9 C), resp. rate 16, height 5\' 7"  (1.702 m), weight 54.4 kg, SpO2 98 %.  PHYSICAL EXAMINATION:   Physical Exam  GENERAL:  76 y.o.-year-old patient lying in the bed with no acute distress.  Weak, deconditioned  LUNGS: Normal breath sounds bilaterally, no wheezing, rales, rhonchi.  CARDIOVASCULAR: S1, S2 normal. No murmurs, rubs, or gallops.  ABDOMEN: Soft, nontender, nondistended. Bowel sounds present.  EXTREMITIES: from Jan 19th, 2023  NEUROLOGIC: nonfocal PSYCHIATRIC:  patient is alert  SKIN:  Pressure Injury 12/04/19 Ankle Anterior;Right Unstageable - Full thickness tissue loss in which the base of the injury is covered by slough (yellow, tan, gray, green or Cleckler) and/or eschar (tan, Baillargeon or black) in the wound bed. (Active)  12/04/19 1837  Location: Ankle  Location Orientation: Anterior;Right  Staging: Unstageable - Full thickness tissue loss in which the base of the injury is covered by slough (yellow, tan, gray, green or Glaus) and/or eschar (tan, Conely or black) in the wound bed.  Wound Description (Comments):   Present on Admission:      Pressure Injury 05/02/21 Coccyx Right;Left Unstageable - Full thickness tissue loss in which the base of the injury is covered by slough (yellow, tan, gray, green or Hillier) and/or eschar (tan, Levier or black)  in the wound bed. black eschar over most ofwo (Active)  05/02/21 1000  Location: Coccyx  Location Orientation: Right;Left  Staging: Unstageable - Full thickness tissue loss in which the base of the injury is covered by slough (yellow, tan, gray, green or Hach) and/or eschar (tan, Tardif or black) in the wound bed.  Wound Description (Comments): black eschar over most ofwound  Present on Admission: Yes     Pressure Injury 05/03/21 Right Stage 2 -  Partial thickness loss of dermis presenting as a shallow open injury with a red, pink wound bed without slough. (Active)  05/03/21 0830  Location:   Location Orientation: Right  Staging: Stage 2 -  Partial thickness loss of dermis presenting as a shallow open injury with a red, pink wound bed without slough.  Wound Description (Comments):   Present on Admission: Yes     Pressure Injury 05/03/21 Back Posterior;Upper Stage 2 -  Partial thickness loss of dermis presenting as a shallow open injury with a red, pink wound bed without slough. Pink wound bed (Active)  05/03/21 1815  Location: Back  Location Orientation: Posterior;Upper  Staging: Stage 2 -  Partial thickness loss of dermis presenting as a shallow open injury with a red, pink wound bed without slough.  Wound Description (Comments): Pink wound bed  Present on Admission: Yes    LABORATORY PANEL:  CBC Recent Labs  Lab 05/02/21 1347  WBC 12.2*  HGB 10.1*  HCT 32.6*  PLT 277     Chemistries  Recent Labs  Lab 05/02/21 1155 05/04/21 1505  05/06/21 0452  NA 142   < > 143  K 4.7   < > 4.1  CL 115*   < > 110  CO2 14*   < > 23  GLUCOSE 127*   < > 159*  BUN 105*   < > 97*  CREATININE 1.87*   < > 2.21*  CALCIUM 10.4*   < > 8.8*  AST 35  --   --   ALT 32  --   --   ALKPHOS 75  --   --   BILITOT 0.6  --   --    < > = values in this interval not displayed.    Cardiac Enzymes No results for input(s): TROPONINI in the last 168 hours. RADIOLOGY:  No results found. ASSESSMENT  AND PLAN:  [name] [name] is a 76 y.o. female with hx of hypertension, type 2 diabetes, hypothyroidism, CKD stage IV, CHF, multiple pressure ulcers, and osteomyelitis, who presents from home for weakness.  Patient recently admitted from December 13 to March 30, 2021.  She was admitted with extensive osteomyelitis of the left foot.  She underwent revascularization as well as debridement.   X-ray of the left foot showed ongoing findings consistent with osteomyelitis.   CT abdomen and pelvis without contrast was obtained for report of some left lower quadrant abdominal pain, which showed no acute findings with exception of large amount of stool in the rectum.  Acute metabolic encephalopathy multifactorial in the setting of poor nutrition, chronic osteomyelitis, elevated BUN creatinine/metabolic acidosis, acute on chronic kidney disease stage IIIB -- consult nephrology Dr. Candiss Norse-- recommends sterile water with 3 amps of bicarb -- monitor input output -- avoid nephrotoxic agents -- creatinine rising along with BUN. Uremia likely causing encephalopathy/confusion -- bicarb corrected. Dr.Bhutani discontinued bicarb drip. Urine output not documented  -- overall creatinine slowly rising  Chronic left lower extremity osteomyelitis --Patient is status post 6-week course of antibiotics for osteomyelitis.   --Last spoke with ID on January 19, at that time note from video visit reports need for wound debridement.   -- Discussed with vascular surgery Dr. Lucky Cowboy. Recommends amputation. -- Spoke with patient's son [name] and he will discuss with mother about amputation as well.  --1/27-- spoke with son [name], patient and palliative care Shieh in the room. Discussed at length risk and complications involved with surgery/amputation. Patient so far is refusing to surgery. Son wants to honor her wishes. I have discussed with patient and son that I will revisit tomorrow in the meantime son will be talking with other  family members. -- 1/28 Patient refused amputation today as well. Son aware of it.  Sacral ulcer present  on admission Consult wound nurse-- recommendations note  Physical deconditioning failure to thrive/poor PO intake Per discussion with patient's son she has had a significant decline in her physical conditioning since her last admission.  Will need assessment and possible SNF placement. - dysphagia 2 diet based on recent SLP recommendations - palliative care consultation-- appreciated.   B12 deficiency dementia - Daily B12 supplementation - Continue donepezil  hypoxia noted in the emergency room. -- Patient does not seem to be in respiratory distress. -- Chest x-ray no acute cardiopulmonary abnormality -- unable to do VQ scans since patient could not tolerate. -- Lower extremity ultrasound shows no DVT --sats 97% on RA  COVID positive, incidental -- no fever no respiratory distress  Nutrition Status: Nutrition Problem: Moderate Malnutrition Etiology: chronic illness (dementia) Signs/Symptoms: mild fat depletion, moderate fat  depletion, mild muscle depletion, moderate muscle depletion Interventions: MVI, Prostat, Hormel Shake    Family communication : son [name] at bedside Consults : vascular,palliative CODE STATUS: full code DVT Prophylaxis : enoxaparin Level of care: Med-Surg Status is: Inpatient  Remains inpatient appropriate because: chronic osteomyelitis lower extremity. Patient likely will need amputation. Vascular consultation noted  1/28-- discussed with son [name] again on the phone. Overall poor prognosis with multiple comorbidities and current ongoing medical issues. Discussed discharge options. Patient's son would like mother to go home. TOC for discharge planning. Discussed code status again. [name] will let me know about it tomorrow. Given overall poor prognosis discussed DNR. Will await final word from son.   TOTAL TIME TAKING CARE OF THIS PATIENT: 25  minutes.  >50% time spent on counselling and coordination of care  Note: This dictation was prepared with Dragon dictation along with smaller phrase technology. Any transcriptional errors that result from this process are unintentional.  Fritzi Mandes M.D    [facility]   CC: Primary care physician; Pcp, No

## 2021-05-06 NOTE — Progress Notes (Signed)
Patient refusing blood sugars at this time. Will try again

## 2021-05-06 NOTE — Progress Notes (Signed)
Speech Language Pathology Treatment: Dysphagia  Patient Details Name: [name] [name] MRN: [id number] DOB: Aug 18, 1945 Today's Date: 05/06/2021 Time: 0840-0900 SLP Time Calculation (min) (ACUTE ONLY): 20 min  Assessment / Plan / Recommendation Clinical Impression  Pt seen for diet tolerance. Based on today's treatment with pt with limited functional change compared to initial evaluation on 05/03/21.   Pt observed thin liquids (via tsp straw sip), pureed, and solid. Pt required physical assistance for repositioning for upright position. Pt unable to feed self despite hand-over-hand assistance. Pt presents with s/sx moderate oral dysphagia c/b prolonged/inefficient mastication with munching pattern, anterior oral holding, and moderate oral residual with solids which took several cued liquid washes to clear. Pt appeared to have reduced awareness of solid trials and subsequent residual. Suspect oral deficits due to lingual weakness and dental status. Oral deficits likely exacerbated by mental status. Pharyngeal swallow appeared [facility] per clinical assessment. No overt or subtle s/sx pharyngeal dysphagia observed. To palpation, pt with seemingly timely swallow initiation and seemingly adequate laryngeal elevation. No change to vocal quality noted across trials.    Per chart review, temp WNL. No recent WBC or chest imaging.   Recommend continuatoin of a pureed diet with thin liquids and safe swallowing strategies/aspiration precautions as outlined below.    Pt is at increased risk for aspiration/aspiration PNA given mental status, dental status, current dependence for feeding, and multiple medical comorbidities.    SLP to f/u per POC x1 for trials of upgraded textures, as appropriate.   Pt and RN made aware of diet recommendations, safe swallowing strategies/aspiration precautions, and SLP POC. ?full understanding by pt.     HPI HPI: Per 62 H&P "[name] [name] is a 76 y.o. female with hx of  hypertension, type 2 diabetes, hypothyroidism, CKD stage IV, CHF, multiple pressure ulcers, and osteomyelitis, who presents from home for weakness.     Patient recently admitted from December 13 to March 30, 2021.  She was admitted with extensive osteomyelitis of the left foot.  She underwent revascularization as well as debridement.  She also had altered mental status will started on Keppra by neurology.     Patient is a limited historian during my interview.  She reports she has been feeling unwell for the past 3 or 4 days, unable to eat or drink much of anything.  Also states she has been unable to get out of bed.  I asked regarding her ADLs, and she reports that she is independent in all of them.     I subsequently called her son [name] [name] who is her caretaker.  He reported that she is independent in all of her ADLs at baseline, she is to have some mobility within her bed but this has deteriorated significantly since her last hospitalization.  He reports she has been taking all her medications as prescribed and finished her course of antibiotics for her osteomyelitis.     In the ED initial vital signs notable for hypoxemia requiring 4 L nasal cannula.  Patient reported she does not use oxygen at baseline at home.  Lab work-up showed CBC with mildly elevated white count of 12 but otherwise at her baseline and with improved anemia.  CMP showed stable CKD, otherwise unremarkable.  UA was also unremarkable.  Lactic acid and troponin were negative.  Chest x-ray showed no evidence of acute cardiopulmonary process.  X-ray of the left foot showed ongoing findings consistent with osteomyelitis.  CT abdomen and pelvis without contrast was obtained for report of  some left lower quadrant abdominal pain, which showed no acute findings with exception of large amount of stool in the rectum."      SLP Plan  Continue with current plan of care      Recommendations for follow up therapy are one component of a  multi-disciplinary discharge planning process, led by the attending physician.  Recommendations may be updated based on patient status, additional functional criteria and insurance authorization.    Recommendations  Diet recommendations: Dysphagia 1 (puree);Thin liquid Medication Administration: Whole meds with liquid (vs crushed with puree) Supervision: Full supervision/cueing for compensatory strategies;Staff to assist with self feeding Compensations: Minimize environmental distractions;Slow rate;Small sips/bites;Follow solids with liquid Postural Changes and/or Swallow Maneuvers: Out of bed for meals;Seated upright 90 degrees;Upright 30-60 min after meal                Oral Care Recommendations: Oral care QID;Oral care before and after PO Follow Up Recommendations:  (TBD) Assistance recommended at discharge: Frequent or constant Supervision/Assistance SLP Visit Diagnosis: Dysphagia, oral phase (R13.11) Plan: Continue with current plan of care          Cherrie Gauze, M.S., [facility] [phone] ([place])   Quintella Baton  05/06/2021, 9:25 AM

## 2021-05-06 NOTE — Progress Notes (Signed)
[name] [name]  MRN: [id number]  DOB/AGE: 07/20/1945 76 y.o.  Primary Care Physician:Pcp, No  Admit date: 05/02/2021  Chief Complaint:  Chief Complaint  Patient presents with   Weakness   Fatigue    S-Pt presented on  05/02/2021 with  Chief Complaint  Patient presents with   Weakness   Fatigue   Patient is a 76 year old African-American. female  With a past medical history of diabetes mellitus, chronic osteomyelitis, hypertension Patient is now admitted with generalized deconditioning and weakness. Patient had a recent admission in December for extensive osteomyelitis of left foot.  Patient underwent revascularization and debridement.  Patient was recommended to have amputation but she is refusing. Patient is not able to provide much information but does confirm that she does not want amputation Patient was seen today on first floor patient resting comfortably in the bed.  Patient does not offer any  complaints   Medications  (feeding supplement) PROSource Plus  30 mL Oral TID BM   vitamin C  500 mg Oral BID   aspirin  81 mg Oral Daily   atorvastatin  40 mg Oral Daily   bisacodyl  10 mg Rectal Daily   chlorhexidine  15 mL Mouth Rinse BID   Chlorhexidine Gluconate Cloth  6 each Topical Daily   collagenase   Topical Daily   donepezil  10 mg Oral QHS   enoxaparin (LOVENOX) injection  30 mg Subcutaneous Q24H   levothyroxine  50 mcg Oral Daily   mouth rinse  15 mL Mouth Rinse q12n4p   multivitamin with minerals  1 tablet Oral Daily   polyethylene glycol  17 g Oral Daily   vitamin B-12  1,000 mcg Oral Daily   zinc sulfate  220 mg Oral Daily         PZW:CHENID to get much data.  Physical Exam: Vital signs in last 24 hours: Temp:  [98 F (36.7 C)-98.4 F (36.9 C)] 98.3 F (36.8 C) (01/28 2345) Pulse Rate:  [59-97] 97 (01/28 2345) Resp:  [15-20] 20 (01/28 2345) BP: (107-120)/(54-72) 107/54 (01/28 2345) SpO2:  [72 %-97 %] 96 % (01/28 2345) Weight change:  Last  BM Date: 05/05/21  Intake/Output from previous day: 01/28 0701 - 01/29 0700 In: 1451.3 [I.V.:1451.3] Out: -  No intake/output data recorded.   Physical Exam:  General- pt is awake,alert, Frail, deconditioned  Resp- No acute REsp distress,  NO Rhonchi  CVS- S1S2 regular in rate and rhythm  GIT- BS+, soft, Non tender , Non distended  EXT- No LE Edema,  No Cyanosis Patient has booties around her feet Patient does have a history of chronic osteomyelitis   Lab Results:  CBC  No results for input(s): WBC, HGB, HCT, PLT in the last 72 hours.   BMET  Recent Labs    05/05/21 0554 05/06/21 0452  NA 144 143  K 4.2 4.1  CL 115* 110  CO2 19* 23  GLUCOSE 157* 159*  BUN 116* 97*  CREATININE 2.19* 2.21*  CALCIUM 9.5 8.8*      Most recent Creatinine trend  Lab Results  Component Value Date   CREATININE 2.21 (H) 05/06/2021   CREATININE 2.19 (H) 05/05/2021   CREATININE 2.10 (H) 05/04/2021      MICRO   Recent Results (from the past 240 hour(s))  Urine Culture     Status: None   Collection Time: 05/02/21 11:30 AM   Specimen: Urine, Catheterized  Result Value Ref Range Status   Specimen Description   Final  URINE, CATHETERIZED Performed at [facility], [address], [place], [place] [address]    Special Requests   Final    NONE Performed at [facility], [address], [place], [place] [address]    Culture   Final    NO GROWTH Performed at [facility], [place] [address], [place], [place] [address]    Report Status 05/04/2021 FINAL  Final  Resp Panel by RT-PCR (Flu A&B, Covid) Nasopharyngeal Swab     Status: Abnormal   Collection Time: 05/02/21  5:54 PM   Specimen: Nasopharyngeal Swab; Nasopharyngeal(NP) swabs in vial transport medium  Result Value Ref Range Status   SARS Coronavirus 2 by RT PCR POSITIVE (A) NEGATIVE Final    Comment: (NOTE) SARS-CoV-2 target nucleic acids are DETECTED.  The SARS-CoV-2 RNA is generally  detectable in upper respiratory specimens during the acute phase of infection. Positive results are indicative of the presence of the identified virus, but do not rule out bacterial infection or co-infection with other pathogens not detected by the test. Clinical correlation with patient history and other diagnostic information is necessary to determine patient infection status. The expected result is Negative.  Fact Sheet for Patients: EntrepreneurPulse.com.au  Fact Sheet for Healthcare Providers: IncredibleEmployment.be  This test is not yet approved or cleared by the [place] FDA and  has been authorized for detection and/or diagnosis of SARS-CoV-2 by FDA under an Emergency Use Authorization (EUA).  This EUA will remain in effect (meaning this test can be used) for the duration of  the COVID-19 declaration under Section 564(b)(1) of the A ct, 21 U.S.C. section 360bbb-3(b)(1), unless the authorization is terminated or revoked sooner.     Influenza A by PCR NEGATIVE NEGATIVE Final   Influenza B by PCR NEGATIVE NEGATIVE Final    Comment: (NOTE) The Xpert Xpress SARS-CoV-2/FLU/RSV plus assay is intended as an aid in the diagnosis of influenza from Nasopharyngeal swab specimens and should not be used as a sole basis for treatment. Nasal washings and aspirates are unacceptable for Xpert Xpress SARS-CoV-2/FLU/RSV testing.  Fact Sheet for Patients: EntrepreneurPulse.com.au  Fact Sheet for Healthcare Providers: IncredibleEmployment.be  This test is not yet approved or cleared by the [place] FDA and has been authorized for detection and/or diagnosis of SARS-CoV-2 by FDA under an Emergency Use Authorization (EUA). This EUA will remain in effect (meaning this test can be used) for the duration of the COVID-19 declaration under Section 564(b)(1) of the Act, 21 U.S.C. section 360bbb-3(b)(1), unless the  authorization is terminated or revoked.  Performed at [facility], [address] [place], [place] [address]          Impression:  [name] [name] is a 76 y.o. female with medical problems of   type 2 diabetes, hypertension, chronic kidney disease, hypothyroidism, diabetic foot ulcer, osteomyelitis, atheromatous arterial calcifications, hiatal hernia was admitted on 05/02/2021 for :   Physical deconditioning [R53.81] Failure to thrive in adult [R62.7] Acute hypoxemic respiratory failure (HCC) [J96.01] Osteomyelitis of left foot, unspecified type ([place]) [M86.9]  1)Renal    AKI secondary to multiple factors Patient has AKI secondary to current SIRS-infection/osteomyelitis Hypotension Patient has AKI secondary to ATN Patient has AKI on CKD Patient has CKD stage IIIb Patient has CKD secondary diabetes mellitus Patient has CKD since 2013-patient creatinine was 1.9 on April 13, 2011. Patient CKD progression has been marked with multiple episodes of AKI. Patient creatinine is currently at plateau 2.1--2.2. No acute need for renal placement  therapy   2)HTN    Blood pressure is stable    3)Anemia of chronic disease  CBC Latest Ref Rng & Units 05/02/2021 03/29/2021 03/28/2021  WBC 4.0 - 10.5 K/uL 12.2(H) 8.4 7.5  Hemoglobin 12.0 - 15.0 g/dL 10.1(L) 8.5(L) 9.6(L)  Hematocrit 36.0 - 46.0 % 32.6(L) 26.6(L) 29.1(L)  Platelets 150 - 400 K/uL 277 202 200       HGb at goal (9--11)   4) Hypophosphatemia Patient phosphorus is on the lower side secondary to decreased p.o. intake Patient is on protein supplement-Prosource Patient is being followed by nutrition services   Lab Results  Component Value Date   CALCIUM 8.8 (L) 05/06/2021   PHOS 2.4 (L) 05/06/2021       5) proteinuria Patient has proteinuria secondary to diabetic nephropathy Patient has had proteinuria going back to 2017   6) Electrolytes   BMP Latest Ref Rng & Units 05/06/2021 05/05/2021  05/04/2021  Glucose 70 - 99 mg/dL 159(H) 157(H) 177(H)  BUN 8 - 23 mg/dL 97(H) 116(H) 77(H)  Creatinine 0.44 - 1.00 mg/dL 2.21(H) 2.19(H) 2.10(H)  Sodium 135 - 145 mmol/L 143 144 145  Potassium 3.5 - 5.1 mmol/L 4.1 4.2 4.3  Chloride 98 - 111 mmol/L 110 115(H) 115(H)  CO2 22 - 32 mmol/L 23 19(L) 18(L)  Calcium 8.9 - 10.3 mg/dL 8.8(L) 9.5 9.9     Sodium Normonatremic   Potassium Normokalemic    7) chronic metabolic acidosis  Patient bicarb is now better We will discontinue IV bicarb   8) chronic osteomyelitis Patient has completed 6 weeks of IV antibiotics Patient has been seen by vascular surgery, ID and primary team Multiple physicians/teams have discussed need/risk/benefit of amputation Patient does not wish for amputation.      Plan:   we will discontinue IV bicarb Will discuss with dietary/nutrition services as patient has low phosphorus No need for K phosphorus for now      Cyara Devoto s Gracy Ehly 05/06/2021, 8:05 AM

## 2021-05-06 NOTE — Progress Notes (Signed)
Poor oral intake throughout the day despite encouragement and full assist from staff. MD Posey Pronto aware and discussed. Mouth care completed. Suction set up at bedside. Gown change and partial linen change. VSS. Bed alarm active. Will continue to monitor.

## 2021-05-07 DIAGNOSIS — Z66 Do not resuscitate: Secondary | ICD-10-CM

## 2021-05-07 LAB — RENAL FUNCTION PANEL
Albumin: 1.8 g/dL — ABNORMAL LOW (ref 3.5–5.0)
Anion gap: 10 (ref 5–15)
BUN: 135 mg/dL — ABNORMAL HIGH (ref 8–23)
CO2: 29 mmol/L (ref 22–32)
Calcium: 8.7 mg/dL — ABNORMAL LOW (ref 8.9–10.3)
Chloride: 106 mmol/L (ref 98–111)
Creatinine, Ser: 2.55 mg/dL — ABNORMAL HIGH (ref 0.44–1.00)
GFR, Estimated: 19 mL/min — ABNORMAL LOW (ref >60)
Glucose, Bld: 140 mg/dL — ABNORMAL HIGH (ref 70–99)
Phosphorus: 2.6 mg/dL (ref 2.5–4.6)
Potassium: 3.9 mmol/L (ref 3.5–5.1)
Sodium: 145 mmol/L (ref 135–145)

## 2021-05-07 LAB — GLUCOSE, CAPILLARY
Glucose-Capillary: 112 mg/dL — ABNORMAL HIGH (ref 70–99)
Glucose-Capillary: 132 mg/dL — ABNORMAL HIGH (ref 70–99)
Glucose-Capillary: 115 mg/dL — ABNORMAL HIGH (ref 70–99)

## 2021-05-07 NOTE — Progress Notes (Addendum)
[facility]) Hospital Liaison Note  MSW received referral for Home w. Hospice services for patient. MSW contacted son/[name] and did not receive an answer. MSW to await approval from family before proceeding with services.  Addendum (2:45p)-- [facility]) Hospital Liaison Note   Received request from Transitions of Care Manager, Deliliah, for hospice services at home after discharge. Chart and patient information under review by [facility] physician. Hospice eligibility approved.   Spoke with Darik to initiate education related to hospice philosophy, services, and team approach to care. Darik verbalized understanding of information given. Per discussion, the plan is for patient to discharge home via [facility] once cleared to DC.    DME needs discussed. Patient has the following equipment in the home (Purchased privately): Hospital bed (through [place]) Patient requests the following equipment for delivery: [employer]  Address verified and is correct in the chart. Darik is the family member to contact to arrange time of equipment delivery.    Please send signed and completed DNR home with patient/family. Please provide prescriptions at discharge as needed to ensure ongoing symptom management.    [facility] information and contact numbers given to family & above information shared with TOC.   Please call with any questions/concerns.    Thank you for the opportunity to participate in this patient's care.   Daphene Calamity, MSW [facility] Liaison  [phone]

## 2021-05-07 NOTE — Progress Notes (Signed)
Palliative:  HPI: 76 y.o. female  with past medical history of hypertension, type 2 diabetes, hypothyroidism, CKD stage IV, CHF, multiple pressure ulcers, and osteomyelitis admitted on 05/02/2021 with weakness. Patient recently admitted from December 13 to March 30, 2021 with extensive osteomyelitis of the left foot.She underwent revascularization as well as debridement. Patient with ongoing osteomyelitis this admission with recommendation for amputation. Patient also with failure to thrive/poor PO intake. Found to be covid positive incidentally. PMT consulted to discuss [place].    I met today with Ms. Borges and no family at bedside. Ms. Gritz is awake and alert. She is oriented to person, place, and able to tell me that they were discussing amputation. She continues to be consistent that she does NOT want amputation even knowing that this will lead to further decline and shorten her life. I discussed with her the importance to ensure she has good care and is happy and comfortable regardless of what happens. I discussed with her code status and asked if she would ever want CPR, shock, breathing tube/machine, life support measures and she tells me "no." She expresses desire to return home with her son. She is open to support at home and open to hospice support if indicated.   Dr. Posey Pronto and [facility] had already discussed with son who agrees with DNR and hospice support at home. I did not call him as plan is already in place according to Ms. Steinhoff's stated wishes during my conversation.   All questions/concerns addressed. Emotional support provided. Discussed with Dr. Posey Pronto and [facility] [place].   Exam: Pocketing food/but clears with sips behind purees. No distress. Lying in bed. Alert and oriented x 3 at time of my visit. Breathing regular, unlabored. Abd flat. Generalized weakness and fatigue.   Plan: - DNR - NO amputation - Home with hospice with support from son  49 min  Vinie Sill, NP Palliative  Medicine Team Pager [phone] (Please see [email or web address] for schedule) Team Phone [phone]    Greater than 50%  of this time was spent counseling and coordinating care related to the above assessment and plan

## 2021-05-07 NOTE — TOC Progression Note (Signed)
Transition of Care [facility]) - Progression Note    Patient Details  Name: [name] [name] MRN: [id number] Date of Birth: [birth year]  Transition [facility]) CM/SW [place], RN Phone Number: 05/07/2021, 12:43 PM  Clinical Narrative:   Dede Query the patient's son, Son stated that she has all equipment at home, She will transition home with [facility] hosp[facility] on Wed. He stated that they are having repairs done to the  house and does not have electricity at the home currently,  Will need EMS to transport home         Expected Discharge Plan and Services                                                 Social Determinants of Health (SDOH) Interventions    Readmission Risk Interventions Readmission Risk Prevention Plan 06/01/2020 05/18/2020 11/19/2019  Transportation Screening Complete Complete Complete  PCP or Specialist Appt within 3-5 Days - Complete Complete  HRI or [place] - Complete Complete  Social Work Consult for [place] Planning/Counseling - Complete Complete  Palliative Care Screening - Not Applicable Complete  Medication Review Press photographer) Complete Complete Complete  PCP or Specialist appointment within 3-5 days of discharge Complete - -  SW Recovery Care/Counseling Consult Complete - -  Palliative Care Screening Not Applicable - -  [place] Not Applicable - -  Some recent data might be hidden

## 2021-05-07 NOTE — TOC Progression Note (Addendum)
Transition of Care [facility]) - Progression Note    Patient Details  Name: [name] [name] MRN: [id number] Date of Birth: Oct 15, 1945  Transition of Care [facility]) CM/SW [place], RN Phone Number: 05/07/2021, 11:09 AM  Clinical Narrative:   patient is not wanting Amputation, She and her son is going to discuss with family about Options and possible comfort care, Will follow up, tried to call [place] the patients son, left a general VM asking for a call back         Expected Discharge Plan and Services                                                 Social Determinants of Health (SDOH) Interventions    Readmission Risk Interventions Readmission Risk Prevention Plan 06/01/2020 05/18/2020 11/19/2019  Transportation Screening Complete Complete Complete  PCP or Specialist Appt within 3-5 Days - Complete Complete  HRI or Home Care Consult - Complete Complete  Social Work Consult for [place] Planning/Counseling - Complete Complete  Palliative Care Screening - Not Applicable Complete  Medication Review Press photographer) Complete Complete Complete  PCP or Specialist appointment within 3-5 days of discharge Complete - -  SW Recovery Care/Counseling Consult Complete - -  [place] Not Applicable - -  Some recent data might be hidden

## 2021-05-07 NOTE — Progress Notes (Signed)
[name] [name] at [name] [name] NAME: [name] [name]    MR#:  [id number]  DATE OF BIRTH:  1946-03-24  SUBJECTIVE:  came in with generalized weakness deconditioning and poor PO intake. Lives with son   Weak, deconditioned  REVIEW OF SYSTEMS:   Review of Systems  Unable to perform ROS: Mental status change  Tolerating Diet: poor PO diet Tolerating PT: NO--bed bound  DRUG ALLERGIES:  No Known Allergies  VITALS:  Blood pressure 118/69, pulse 78, temperature 98.2 F (36.8 C), resp. rate 15, height 5\' 7"  (1.702 m), weight 54.4 kg, SpO2 98 %.  PHYSICAL EXAMINATION:   Physical Exam  GENERAL:  76 y.o.-year-old patient lying in the bed with no acute distress.  Weak, deconditioned  LUNGS: Normal breath sounds bilaterally, no wheezing, rales, rhonchi.  CARDIOVASCULAR: S1, S2 normal. No murmurs, rubs, or gallops.  ABDOMEN: Soft, nontender, nondistended. Bowel sounds present.  EXTREMITIES: from Jan 19th, 2023  NEUROLOGIC: nonfocal PSYCHIATRIC:  patient is alert  SKIN:  Pressure Injury 12/04/19 Ankle Anterior;Right Unstageable - Full thickness tissue loss in which the base of the injury is covered by slough (yellow, tan, gray, green or [place]) and/or eschar (tan, Celestin or black) in the wound bed. (Active)  12/04/19 1837  Location: Ankle  Location Orientation: Anterior;Right  Staging: Unstageable - Full thickness tissue loss in which the base of the injury is covered by slough (yellow, tan, gray, green or [place]) and/or eschar (tan, Bas[place] or black) in the wound bed.  Wound Description (Comments):   Present on Admission:      Pressure Injury 05/02/21 Coccyx Right;Left Unstageable - Full thickness tissue loss in which the base of the injury is covered by slough (yellow, tan, gray, green or [place]) and/or eschar (tan, [place] or black) in the wound bed. black eschar over most ofwo (Active)  05/02/21 1000  Location: Coccyx  Location Orientation: Right;Left   Staging: Unstageable - Full thickness tissue loss in which the base of the injury is covered by slough (yellow, tan, gray, green or [place]) and/or eschar (tan, [place]ta or black) in the wound bed.  Wound Description (Comments): black eschar over most ofwound  Present on Admission: Yes     Pressure Injury 05/03/21 Right Stage 2 -  Partial thickness loss of dermis presenting as a shallow open injury with a red, pink wound bed without slough. (Active)  05/03/21 0830  Location:   Location Orientation: Right  Staging: Stage 2 -  Partial thickness loss of dermis presenting as a shallow open injury with a red, pink wound bed without slough.  Wound Description (Comments):   Present on Admission: Yes     Pressure Injury 05/03/21 Back Posterior;Upper Stage 2 -  Partial thickness loss of dermis presenting as a shallow open injury with a red, pink wound bed without slough. Pink wound bed (Active)  05/03/21 1815  Location: Back  Location Orientation: Posterior;Upper  Staging: Stage 2 -  Partial thickness loss of dermis presenting as a shallow open injury with a red, pink wound bed without slough.  Wound Description (Comments): Pink wound bed  Present on Admission: Yes    LABORATORY PANEL:  CBC Recent Labs  Lab 05/02/21 1347  WBC 12.2*  HGB 10.1*  HCT 32.6*  PLT 277     Chemistries  Recent Labs  Lab 05/02/21 1155 05/04/21 1505 05/07/21 0509  NA 142   < > 145  K 4.7   < > 3.9  CL 115*   < >  106  CO2 14*   < > 29  GLUCOSE 127*   < > 140*  BUN 105*   < > 135*  CREATININE 1.87*   < > 2.55*  CALCIUM 10.4*   < > 8.7*  AST 35  --   --   ALT 32  --   --   ALKPHOS 75  --   --   BILITOT 0.6  --   --    < > = values in this interval not displayed.    Cardiac Enzymes No results for input(s): TROPONINI in the last 168 hours. RADIOLOGY:  No results found. ASSESSMENT AND PLAN:  [name] [name] is a 76 y.o. female with hx of hypertension, type 2 diabetes, hypothyroidism, CKD stage IV, CHF,  multiple pressure ulcers, and osteomyelitis, who presents from home for weakness.  Patient recently admitted from December 13 to March 30, 2021.  She was admitted with extensive osteomyelitis of the left foot.  She underwent revascularization as well as debridement.   X-ray of the left foot showed ongoing findings consistent with osteomyelitis.   CT abdomen and pelvis without contrast was obtained for report of some left lower quadrant abdominal pain, which showed no acute findings with exception of large amount of stool in the rectum.  Acute metabolic encephalopathy multifactorial in the setting of poor nutrition, chronic osteomyelitis, elevated BUN creatinine/metabolic acidosis, acute on chronic kidney disease stage IIIB -- consult nephrology [name] [name]-- recommends sterile water with 3 amps of bicarb -- monitor input output -- avoid nephrotoxic agents -- creatinine rising along with BUN. Uremia likely causing encephalopathy/confusion -- bicarb corrected. [name] [name] discontinued bicarb drip. Urine output not documented  -- overall creatinine slowly rising  Chronic left lower extremity osteomyelitis --Patient is status post 6-week course of antibiotics for osteomyelitis.   --Last spoke with ID on January 19, at that time note from video visit reports need for wound debridement.   -- Discussed with vascular surgery [name] [name]. Recommends amputation. -- Spoke with patient's son [name] [name] and he will discuss with mother about amputation as well.  --1/27-- spoke with son [name] [name], patient and palliative care [name] [name] in the room. Discussed at length risk and complications involved with surgery/amputation. Patient so far is refusing to surgery. Son wants to honor her wishes. I have discussed with patient and son that I will revisit tomorrow in the meantime son will be talking with other family members. -- 1/28 Patient refused amputation today as well. Son aware of it.  Sacral ulcer present  on admission --  continue dressing changes  Physical deconditioning failure to thrive/poor PO intake Per discussion with patient's son she has had a significant decline in her physical conditioning since her last admission.   - dysphagia 2 diet based on recent SLP recommendations - palliative care consultation-- appreciated.   B12 deficiency dementia - Daily B12 supplementation - Continue donepezil  hypoxia noted in the emergency room. -- Patient does not seem to be in respiratory distress. -- Chest x-ray no acute cardiopulmonary abnormality -- unable to do VQ scans since patient could not tolerate. -- Lower extremity ultrasound shows no DVT --sats 97% on RA  COVID positive, incidental -- no fever no respiratory distress  Nutrition Status: Nutrition Problem: Moderate Malnutrition Etiology: chronic illness (dementia) Signs/Symptoms: mild fat depletion, moderate fat depletion, mild muscle depletion, moderate muscle depletion Interventions: MVI, Prostat, Hormel Shake    Family communication : son [name] on the phone Consults : vascular,palliative CODE STATUS:DNR DVT Prophylaxis : enoxaparin  Level of care: Med-Surg Status is: Inpatient  Remains inpatient appropriate because: chronic osteomyelitis lower extremity. Patient likely will need amputation. Vascular consultation noted  1/28-- discussed with son [name] [name] again on the phone. Overall poor prognosis with multiple comorbidities and current ongoing medical issues. Discussed discharge options. Patient's son would like mother to go home. TOC for discharge planning. Discussed code status again. [name] [name] will let me know about it tomorrow. Given overall poor prognosis discussed DNR. Will await final word from son.  1/30-- palliative care had discussion with patient earlier and I had discussion with patient's son [name] [name] on the phone. Overall patient's condition remains poor and has multiorgan failure including rising creatinine. Patient wishes to be  DNR. Son agrees with it. Will discharge patient home with hospice services. Son tells me there is no electricity at home and likely will have to stay in the hospital till Wednesday.     TOTAL TIME TAKING CARE OF THIS PATIENT: 25 minutes.  >50% time spent on counselling and coordination of care  Note: This dictation was prepared with Dragon dictation along with smaller phrase technology. Any transcriptional errors that result from this process are unintentional.  Fritzi Mandes M.D    [facility]   CC: Primary care physician; Pcp, No

## 2021-05-08 LAB — RENAL FUNCTION PANEL
Albumin: 1.7 g/dL — ABNORMAL LOW (ref 3.5–5.0)
Anion gap: 12 (ref 5–15)
BUN: 127 mg/dL — ABNORMAL HIGH (ref 8–23)
CO2: 26 mmol/L (ref 22–32)
Calcium: 8.4 mg/dL — ABNORMAL LOW (ref 8.9–10.3)
Chloride: 109 mmol/L (ref 98–111)
Creatinine, Ser: 2.91 mg/dL — ABNORMAL HIGH (ref 0.44–1.00)
GFR, Estimated: 16 mL/min — ABNORMAL LOW (ref >60)
Glucose, Bld: 115 mg/dL — ABNORMAL HIGH (ref 70–99)
Phosphorus: 2.9 mg/dL (ref 2.5–4.6)
Potassium: 3.8 mmol/L (ref 3.5–5.1)
Sodium: 147 mmol/L — ABNORMAL HIGH (ref 135–145)

## 2021-05-08 LAB — GLUCOSE, CAPILLARY
Glucose-Capillary: 108 mg/dL — ABNORMAL HIGH (ref 70–99)
Glucose-Capillary: 110 mg/dL — ABNORMAL HIGH (ref 70–99)
Glucose-Capillary: 93 mg/dL (ref 70–99)
Glucose-Capillary: 94 mg/dL (ref 70–99)

## 2021-05-08 NOTE — Progress Notes (Signed)
Nutrition Follow-up  DOCUMENTATION CODES:   Non-severe (moderate) malnutrition in context of chronic illness  INTERVENTION:   -Continue MVI with minerals daily -Continue 500 mg vitamin C BID -Continue 220 mg zinc sulfate daily x 14 days -Continue Hormel Shake TID, each supplement provides 520 kcals and 22 grams protein -Continue 30 ml Prosource Plus TID, each supplement provides 100 kcals and 15 grams protein -Continue feeding assistance with meals  NUTRITION DIAGNOSIS:   Moderate Malnutrition related to chronic illness (dementia) as evidenced by mild fat depletion, moderate fat depletion, mild muscle depletion, moderate muscle depletion.  Ongoing  GOAL:   Patient will meet greater than or equal to 90% of their needs  Unmet  MONITOR:   PO intake, Supplement acceptance, Diet advancement, Labs, Weight trends, Skin, I & O's  REASON FOR ASSESSMENT:   Malnutrition Screening Tool    ASSESSMENT:   CECILIE HEIDEL is a 76 y.o. female with hx of hypertension, type 2 diabetes, hypothyroidism, CKD stage IV, CHF, multiple pressure ulcers, and osteomyelitis, who presents from home for weakness.  Reviewed I/O's: -225 ml x 24 hours and +1.7 L suince admission  UOP: 225 ml x 24 hours   Pt unavailable at time of visit. Attempted to speak with pt via call to hospital room phone, however, unable to reach.   Pt remains with poor oral intake. Noted meal completions 5%, despite assistance from staff. Pt also intermittently refusing care. She is refusing Prosource supplements.   Per palliative care notes, plan for no amputation. Pt son would like for pt to return home with hospice.   Medications reviewed and include dulcolax, miralax, and vitamin B-12.   Labs reviewed: Na: 147, CBGS: 108-139 (inpatient orders for glycemic control are none).    Diet Order:   Diet Order             DIET - DYS 1 Room service appropriate? Yes with Assist; Fluid consistency: Thin  Diet effective now                    EDUCATION NEEDS:   Education needs have been addressed  Skin:  Skin Assessment: Skin Integrity Issues: Skin Integrity Issues:: Stage II Stage II: upper back Unstageable: rt heel, coccyx  Last BM:  05/05/21  Height:   Ht Readings from Last 1 Encounters:  05/02/21 5\' 7"  (1.702 m)    Weight:   Wt Readings from Last 1 Encounters:  05/02/21 54.4 kg    Ideal Body Weight:  61.4 kg  BMI:  Body mass index is 18.79 kg/m.  Estimated Nutritional Needs:   Kcal:  1950-2150  Protein:  100-125 grams  Fluid:  > 1.9 L    Loistine Chance, RD, LDN, [place] Registered Dietitian II Certified Diabetes Care and Education Specialist Please refer to [facility] for RD and/or RD on-call/weekend/after hours pager

## 2021-05-08 NOTE — Progress Notes (Signed)
[name] [name] at [name] [name] NAME: [name] [name]    MR#:  [id number]  DATE OF BIRTH:  02-26-1946  SUBJECTIVE:  came in with generalized weakness deconditioning and poor PO intake. Lives with son   Weak, deconditioned Remains sleepy today  REVIEW OF SYSTEMS:   Review of Systems  Unable to perform ROS: Mental status change  Tolerating Diet: poor PO diet Tolerating PT: NO--bed bound  DRUG ALLERGIES:  No Known Allergies  VITALS:  Blood pressure 116/64, pulse 65, temperature 98.2 F (36.8 C), temperature source Axillary, resp. rate 16, height 5\' 7"  (1.702 m), weight 54.4 kg, SpO2 93 %.  PHYSICAL EXAMINATION:   Physical Exam  GENERAL:  76 y.o.-year-old patient lying in the bed with no acute distress.  Weak, deconditioned  LUNGS: Normal breath sounds bilaterally, no wheezing, rales, rhonchi.  CARDIOVASCULAR: S1, S2 normal. No murmurs, rubs, or gallops.  ABDOMEN: Soft, nontender, nondistended. Bowel sounds present.  EXTREMITIES: from Jan 19th, 2023  NEUROLOGIC: nonfocal PSYCHIATRIC:  patient is alert  SKIN:  Pressure Injury 12/04/19 Ankle Anterior;Right Unstageable - Full thickness tissue loss in which the base of the injury is covered by slough (yellow, tan, gray, green or [place]) and/or eschar (tan, [place] or black) in the wound bed. (Active)  12/04/19 1837  Location: Ankle  Location Orientation: Anterior;Right  Staging: Unstageable - Full thickness tissue loss in which the base of the injury is covered by slough (yellow, tan, gray, green or [place]) and/or eschar (tan, [place] or black) in the wound bed.  Wound Description (Comments):   Present on Admission:      Pressure Injury 05/02/21 Coccyx Right;Left Unstageable - Full thickness tissue loss in which the base of the injury is covered by slough (yellow, tan, gray, green or [place]) and/or eschar (tan, [place] or black) in the wound bed. black eschar over most ofwo (Active)  05/02/21 1000   Location: Coccyx  Location Orientation: Right;Left  Staging: Unstageable - Full thickness tissue loss in which the base of the injury is covered by slough (yellow, tan, gray, green or [place]) and/or eschar (tan, Ragain or black) in the wound bed.  Wound Description (Comments): black eschar over most ofwound  Present on Admission: Yes     Pressure Injury 05/03/21 Right Stage 2 -  Partial thickness loss of dermis presenting as a shallow open injury with a red, pink wound bed without slough. (Active)  05/03/21 0830  Location:   Location Orientation: Right  Staging: Stage 2 -  Partial thickness loss of dermis presenting as a shallow open injury with a red, pink wound bed without slough.  Wound Description (Comments):   Present on Admission: Yes     Pressure Injury 05/03/21 Back Posterior;Upper Stage 2 -  Partial thickness loss of dermis presenting as a shallow open injury with a red, pink wound bed without slough. Pink wound bed (Active)  05/03/21 1815  Location: Back  Location Orientation: Posterior;Upper  Staging: Stage 2 -  Partial thickness loss of dermis presenting as a shallow open injury with a red, pink wound bed without slough.  Wound Description (Comments): Pink wound bed  Present on Admission: Yes    LABORATORY PANEL:  CBC Recent Labs  Lab 05/02/21 1347  WBC 12.2*  HGB 10.1*  HCT 32.6*  PLT 277     Chemistries  Recent Labs  Lab 05/02/21 1155 05/04/21 1505 05/08/21 0503  NA 142   < > 147*  K 4.7   < >  3.8  CL 115*   < > 109  CO2 14*   < > 26  GLUCOSE 127*   < > 115*  BUN 105*   < > 127*  CREATININE 1.87*   < > 2.91*  CALCIUM 10.4*   < > 8.4*  AST 35  --   --   ALT 32  --   --   ALKPHOS 75  --   --   BILITOT 0.6  --   --    < > = values in this interval not displayed.    Cardiac Enzymes No results for input(s): TROPONINI in the last 168 hours. RADIOLOGY:  No results found. ASSESSMENT AND PLAN:  [name] is a 76 y.o. female with hx of  hypertension, type 2 diabetes, hypothyroidism, CKD stage IV, CHF, multiple pressure ulcers, and osteomyelitis, who presents from home for weakness.  Patient recently admitted from December 13 to March 30, 2021.  She was admitted with extensive osteomyelitis of the left foot.  She underwent revascularization as well as debridement.   X-ray of the left foot showed ongoing findings consistent with osteomyelitis.   CT abdomen and pelvis without contrast was obtained for report of some left lower quadrant abdominal pain, which showed no acute findings with exception of large amount of stool in the rectum.  Acute metabolic encephalopathy ([place]) multifactorial in the setting of poor nutrition, chronic osteomyelitis, elevated BUN creatinine/metabolic acidosis, acute on chronic kidney disease stage IIIB -- consult nephrology Dr. Candiss Norse-- recommends sterile water with 3 amps of bicarb -- monitor input output -- avoid nephrotoxic agents -- creatinine rising along with BUN. Uremia likely causing encephalopathy/confusion -- bicarb corrected. Dr.Bhutani discontinued bicarb drip. Urine output not documented  -- overall BUN/creatinine slowly rising  Chronic left lower extremity osteomyelitis --Patient is status post 6-week course of antibiotics for osteomyelitis.   -- Discussed with vascular surgery Dr. Lucky Cowboy. Recommends amputation.  --1/27-- spoke with son Vicente [name], patient and palliative care [place] in the room. Discussed at length risk and complications involved with surgery/amputation. Patient is refusing to surgery. Son wants to honor her wishes.   Sacral ulcer present  on admission -- continue dressing changes  Physical deconditioning failure to thrive/poor PO intake Per discussion with patient's son she has had a significant decline in her physical conditioning since her last admission.   - dysphagia 2 diet based on recent SLP recommendations - palliative care consultation-- appreciated.   hypoxia  noted in the emergency room. -- Patient does not seem to be in respiratory distress. -- Chest x-ray no acute cardiopulmonary abnormality -- unable to do VQ scans since patient could not tolerate. -- Lower extremity ultrasound shows no DVT --sats 93-97% on RA --pt is not in respiratory distress  COVID positive, incidental -- no fever no respiratory distress  Nutrition Status: Nutrition Problem: Moderate Malnutrition Etiology: chronic illness (dementia) Signs/Symptoms: mild fat depletion, moderate fat depletion, mild muscle depletion, moderate muscle depletion Interventions: MVI, Prostat, Hormel Shake    Family communication : son [name] on the phone 1/30 Consults : vascular,palliative CODE STATUS:DNR DVT Prophylaxis : enoxaparin Level of care: Med-Surg Status is: Inpatient  Remains inpatient appropriate because: chronic osteomyelitis lower extremity. Patient likely will need amputation. Vascular consultation noted  1/28-- discussed with son Vicente [name] again on the phone. Overall poor prognosis with multiple comorbidities and current ongoing medical issues. Discussed discharge options. Patient's son would like mother to go home. TOC for discharge planning. Discussed code status again. Vicente [name] will let me  know about it tomorrow. Given overall poor prognosis discussed DNR. Will await final word from son.  1/30-- palliative care had discussion with patient earlier and I had discussion with patient's son Vicente [name] on the phone. Overall patient's condition remains poor and has multiorgan failure including rising creatinine. Patient wishes to be DNR. Son agrees with it. Will discharge patient home with hospice services. Son tells me there is no electricity at home and likely will have to stay in the hospital till Wednesday.   1/31--pt can d/c home with hospice once electricity at home is restored    TOTAL TIME TAKING CARE OF THIS PATIENT: 20 minutes.  >50% time spent on counselling and  coordination of care  Note: This dictation was prepared with Dragon dictation along with smaller phrase technology. Any transcriptional errors that result from this process are unintentional.  Fritzi Mandes M.D    [facility]   CC: Primary care physician; Pcp, No

## 2021-05-08 NOTE — Care Management Important Message (Signed)
Important Message  Patient Details  Name: [name] [name] MRN: [id number] Date of Birth: 10/18/1945   Medicare Important Message Given:  Other (see comment)  Patient will discharge home with Hospice. Out of respect for the patient and family no Important Message from [facility] given.   [name] 05/08/2021, 3:09 PM

## 2021-05-08 NOTE — Progress Notes (Signed)
[name] [name]  MRN: [id number]  DOB/AGE: [birth year] 76 y.o.  Primary Care Physician:Pcp, No  Admit date: 05/02/2021  Chief Complaint:  Chief Complaint  Patient presents with   Weakness   Fatigue    S-Pt presented on  05/02/2021 with  Chief Complaint  Patient presents with   Weakness   Fatigue   Per MD note, patient will be discharged home with hospice services.  Impression:  [name] [name] is a 76 y.o. female with medical problems of   type 2 diabetes, hypertension, chronic kidney disease, hypothyroidism, diabetic foot ulcer, osteomyelitis, atheromatous arterial calcifications, hiatal hernia was admitted on 05/02/2021 for :   Physical deconditioning [R53.81] Failure to thrive in adult [R62.7] Acute hypoxemic respiratory failure (HCC) [J96.01] Osteomyelitis of left foot, unspecified type ([place]) [M86.9]  1)Renal    AKI secondary to multiple factors Patient has AKI secondary to current SIRS-infection/osteomyelitis Hypotension Creatinine 2.91 with reduced urine output of 225 mL in 24 hours   2)HTN  Blood pressure is stable    3)Anemia of chronic disease  CBC Latest Ref Rng & Units 05/02/2021 03/29/2021 03/28/2021  WBC 4.0 - 10.5 K/uL 12.2(H) 8.4 7.5  Hemoglobin 12.0 - 15.0 g/dL 10.1(L) 8.5(L) 9.6(L)  Hematocrit 36.0 - 46.0 % 32.6(L) 26.6(L) 29.1(L)  Platelets 150 - 400 K/uL 277 202 200  Hemoglobin 10.1   4) Hypophosphatemia Patient phosphorus is on the lower side secondary to decreased p.o. intake Lab Results  Component Value Date   CALCIUM 8.4 (L) 05/08/2021   PHOS 2.9 05/08/2021    5) chronic osteomyelitis Patient has completed 6 weeks of IV antibiotics Patient has been seen by vascular surgery, ID and primary team Multiple physicians/teams have discussed need/risk/benefit of amputation Patient does not wish for amputation.      Plan:  Due to plans to discharge to home care services with comfort measures, we will sign off at this time.  Feel free  to consult with questions or concerns.     [place]  05/08/2021, 12:13 PM

## 2021-05-08 NOTE — Progress Notes (Addendum)
[facility]) Hospital Liaison Note  [facility] continues to follow patient as plan is for patient to return home with [facility] hospice services. IDT weened patient off of 02 this AM. At this time, no 02 will be ordered for patient as per RN/Ana, patient is stating 93% on RA. MD/Dr. Posey Pronto aware and in agreement. [place] team aware of above information.   Please send signed and completed DNR home with patient/family. Please provide prescriptions at discharge as needed to ensure ongoing symptom management.    [facility] information and contact numbers given to family & above information shared with TOC.   Please call with any questions/concerns.    Thank you for the opportunity to participate in this patient's care.   Daphene Calamity, MSW [facility] Liaison  [phone]

## 2021-05-09 LAB — GLUCOSE, CAPILLARY: Glucose-Capillary: 120 mg/dL — ABNORMAL HIGH (ref 70–99)

## 2021-05-09 MED ORDER — TRAMADOL HCL 50 MG PO TABS: 50.0000 mg | ORAL_TABLET | Freq: Four times a day (QID) | ORAL | 0 refills | Status: DC | PRN

## 2021-05-09 MED ORDER — MORPHINE SULFATE 20 MG/5ML PO SOLN: 2.5000 mg | ORAL | 0 refills | Status: DC | PRN

## 2021-05-09 NOTE — Discharge Summary (Signed)
Physician Discharge Summary  [name] [name]:[id number] DOB: 1945/04/28 DOA: 05/02/2021  PCP: Merryl Hacker, No  Admit date: 05/02/2021 Discharge date: 05/09/2021  Admitted From: Home Discharge disposition: Home with hospice services   Code Status: DNR  Diet Recommendation: As tolerated  Discharge Diagnosis:   Principal Problem:   Physical deconditioning Active Problems:   Diabetic ulcer of right foot associated with type 2 diabetes mellitus, with fat layer exposed ([place])   Essential hypertension   Type 2 diabetes mellitus with stage 3 chronic kidney disease ([place])   Hypothyroidism (acquired)   CKD (chronic kidney disease), stage IV ([place])   Congestive heart failure ([place])   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 female  with past medical history of hypertension, type 2 diabetes, hypothyroidism, CKD stage IV, CHF, multiple pressure ulcers, and osteomyelitis  Patient presented to the ED on 05/02/2021 with generalized weakness. Recent admission from 03/20/22-03/30/22 with extensive osteomyelitis of the left foot. She underwent revascularization as well as debridement.  This admission, patient was noted to have ongoing osteomyelitis and hence amputation was recommended.  Patient denies amputation.  She was also found to be COVID-positive.   Palliative care consultation was obtained.  Patient ultimately made a choice to be hospice.   Currently arrangements being made to discharge home with hospice services.    Subjective:  Seen and briefly examined this morning.  Pleasant elderly African-American female.  Lying down in bed.  Alert, awake, nods head to answer questions.  Not in pain.  Family not at bedside.  Hospital Course:  Comfort care status  end-of-life care -Initially admitted for osteomyelitis. -Patient refused to get amputation.  Palliative care was consulted.  Patient chose  comfort care measures -Plan is to discharge to home with home hospice once arrangements are made at home.  Acute issues addressed in the hospital: Acute on chronic left lower extremity osteomyelitis Acute metabolic encephalopathy AKI on CKD 3B Sacral ulcer -POA Physical deconditioning Failure to thrive Incidental COVID-positive   Discharge Medications:   Allergies as of 05/09/2021   No Known Allergies      Medication List     STOP taking these medications    amLODipine 10 MG tablet Commonly known as: NORVASC   amoxicillin 500 MG capsule Commonly known as: AMOXIL   ASPIRIN 81 PO   atorvastatin 40 MG tablet Commonly known as: LIPITOR   donepezil 10 MG tablet Commonly known as: ARICEPT   gabapentin 100 MG capsule Commonly known as: NEURONTIN   insulin aspart protamine- aspart (70-30) 100 UNIT/ML injection Commonly known as: NOVOLOG MIX 70/30   levETIRAcetam 250 MG tablet Commonly known as: Keppra   polyethylene glycol 17 g packet Commonly known as: MIRALAX / GLYCOLAX   Synthroid 50 MCG tablet Generic drug: levothyroxine       TAKE these medications    acetaminophen 325 MG tablet Commonly known as: TYLENOL Take 2 tablets (650 mg total) by mouth every 6 (six) hours as needed for mild pain (or Fever >/= 100.4).   bisacodyl 10 MG suppository Commonly known as: Dulcolax Place 1 suppository (10 mg total) rectally as needed for moderate constipation.   morphine 20 MG/5ML solution Take 0.6 mLs (2.4 mg total) by mouth every 2 (two) hours as needed for up to 3 days for pain.   traMADol 50 MG tablet Commonly known as: ULTRAM Take 1 tablet (50 mg total) by mouth  every 6 (six) hours as needed for up to 3 days for moderate pain or severe pain.               Discharge Care Instructions  (From admission, onward)           Start     Ordered   05/09/21 0000  Discharge wound care:        05/09/21 1245            Wound care:   Wound /  Incision (Open or Dehisced) 11/15/19 (IAD) Incontinence Associated Dermatitis Buttocks Bilateral (Active)  Date First Assessed/Time First Assessed: 11/15/19 2036   Wound Type: (IAD) Incontinence Associated Dermatitis  Location: Buttocks  Location Orientation: Bilateral  Present on Admission: Yes    Assessments 11/16/2019  9:04 AM 11/16/2019  7:50 PM  Dressing Type Other (Comment) --  Dressing Changed New New  Dressing Status Clean;Dry;Intact Clean;Dry;Intact  Dressing Change Frequency PRN PRN     No Linked orders to display     Wound / Incision (Open or Dehisced) 11/16/19 (IAD) Incontinence Associated Dermatitis Groin Right;Left (Active)  Date First Assessed/Time First Assessed: 11/16/19 0800   Wound Type: (IAD) Incontinence Associated Dermatitis  Location: Groin  Location Orientation: Right;Left  Present on Admission: Yes    Assessments 11/16/2019  9:04 AM 11/16/2019  7:50 PM  Dressing Status None None  Site / Wound Assessment Clean;Dry Clean;Dry  Drainage Amount None None  Treatment Cleansed;Other (Comment) Cleansed     No Linked orders to display     Pressure Injury 12/04/19 Ankle Anterior;Right Unstageable - Full thickness tissue loss in which the base of the injury is covered by slough (yellow, tan, gray, green or [place]) and/or eschar (tan, Heindl or black) in the wound bed. (Active)  Date First Assessed/Time First Assessed: 12/04/19 1837   Location: Ankle  Location Orientation: Anterior;Right  Staging: Unstageable - Full thickness tissue loss in which the base of the injury is covered by slough (yellow, tan, gray, green or [place]zyk) ...    Assessments 12/04/2019  6:38 PM 12/07/2019  9:00 AM  Dressing Type -- Foam - Lift dressing to assess site every shift  Dressing -- Clean;Dry;Intact  Wound Length (cm) 0.5 cm --  Wound Width (cm) 0.5 cm --  Wound Depth (cm) 0 cm --  Wound Surface Area (cm^2) 0.25 cm^2 --  Wound Volume (cm^3) 0 cm^3 --     No Linked orders to display     Incision  (Closed) 05/31/20 Foot Other (Comment) (Active)  Date First Assessed/Time First Assessed: 05/31/20 1710   Location: Foot  Location Orientation: Other (Comment)    Assessments 05/31/2020  5:21 PM 06/05/2020  3:00 AM  Dressing Type Abdominal pads Abdominal pads  Dressing -- Intact  Site / Wound Assessment -- Dressing in place / Unable to assess  Drainage Amount None None     No Linked orders to display     Wound / Incision (Open or Dehisced) 03/20/21 Diabetic ulcer Foot Left (Active)  Date First Assessed/Time First Assessed: 03/20/21 1830   Wound Type: Diabetic ulcer  Location: Foot  Location Orientation: Left  Present on Admission: Yes    Assessments 03/21/2021  8:05 AM 03/31/2021  6:30 AM  Dressing Changed -- Changed  Dressing Status -- Clean;Dry;Intact  Dressing Change Frequency -- Daily  Site / Wound Assessment Dressing in place / Unable to assess Langi;Dusky     No Linked orders to display     Incision (Closed) 03/22/21 Foot Left (  Active)  Date First Assessed/Time First Assessed: 03/22/21 1716   Location: Foot  Location Orientation: Left    Assessments 03/22/2021  5:00 PM 03/30/2021 11:09 AM  Dressing Type Abdominal pads;Gauze (Comment);Compression wrap Compression wrap  Dressing -- Clean;Dry;Intact  Dressing Change Frequency -- Daily  Closure None --  Treatment -- Off loading     No Linked orders to display     Pressure Injury 05/02/21 Coccyx Right;Left Unstageable - Full thickness tissue loss in which the base of the injury is covered by slough (yellow, tan, gray, green or [place]y) and/or eschar (tan, [place]in or black) in the wound bed. black eschar over most ofwo (Active)  Date First Assessed/Time First Assessed: 05/02/21 1000   Location: Coccyx  Location Orientation: Right;Left  Staging: Unstageable - Full thickness tissue loss in which the base of the injury is covered by slough (yellow, tan, gray, green or Dale[place]) and...    Assessments 05/03/2021 12:08 AM 05/08/2021  5:00 PM   Dressing Type Foam - Lift dressing to assess site every shift Gauze (Comment)  Dressing Changed Changed  Dressing Change Frequency -- Every 3 days  State of Healing Non-healing Non-healing  Site / Wound Assessment Clean;Black;[place] Clean;Dry  % Wound base Red or Granulating 0% --  % Wound base Yellow/Fibrinous Exudate 0% --  % Wound base Black/Eschar 100% --  Peri-wound Assessment Intact --  Wound Length (cm) 4 cm --  Wound Width (cm) 11 cm --  Wound Depth (cm) 0 cm --  Wound Surface Area (cm^2) 44 cm^2 --  Wound Volume (cm^3) 0 cm^3 --  Drainage Amount Minimal --  Drainage Description Serosanguineous --     No Linked orders to display     Wound / Incision (Open or Dehisced) 05/02/21 Heel Left unstageable (Active)  Date First Assessed/Time First Assessed: 05/02/21 2200   Location: Heel  Location Orientation: Left  Wound Description (Comments): unstageable  Present on Admission: Yes    Assessments 05/03/2021  6:49 AM 05/07/2021  8:00 PM  Dressing Status -- Clean;Dry  Dressing Change Frequency -- Daily  % Wound base Red or Granulating 20% --  % Wound base Black/Eschar 80% --  Wound Length (cm) 4.5 cm --  Wound Width (cm) 5 cm --  Wound Surface Area (cm^2) 22.5 cm^2 --  Margins Attached edges (approximated) --  Drainage Amount Scant --  Drainage Description Serous;Odor --     No Linked orders to display     Pressure Injury 05/03/21 Right Stage 2 -  Partial thickness loss of dermis presenting as a shallow open injury with a red, pink wound bed without slough. (Active)  Date First Assessed/Time First Assessed: 05/03/21 0830   Location Orientation: Right  Staging: Stage 2 -  Partial thickness loss of dermis presenting as a shallow open injury with a red, pink wound bed without slough.  Present on Admission: Yes    Assessments 05/03/2021  6:49 AM 05/08/2021  5:00 PM  Dressing Type -- Foam - Lift dressing to assess site every shift  Dressing -- Changed  Dressing Change Frequency --  Daily  State of Healing -- Early/partial granulation  Site / Wound Assessment -- Clean;Dry;Pink  % Wound base Red or Granulating 100% --  Wound Length (cm) 1 cm --  Wound Width (cm) 1 cm --  Wound Depth (cm) 0.1 cm --  Wound Surface Area (cm^2) 1 cm^2 --  Wound Volume (cm^3) 0.1 cm^3 --     No Linked orders to display  Pressure Injury 05/03/21 Back Posterior;Upper Stage 2 -  Partial thickness loss of dermis presenting as a shallow open injury with a red, pink wound bed without slough. Pink wound bed (Active)  Date First Assessed/Time First Assessed: 05/03/21 1815   Location: Back  Location Orientation: Posterior;Upper  Staging: Stage 2 -  Partial thickness loss of dermis presenting as a shallow open injury with a red, pink wound bed without slough.  Wound ...    Assessments 05/03/2021  1:45 PM 05/08/2021  5:00 PM  Dressing Type -- Foam - Lift dressing to assess site every shift  Dressing Changed Changed;Dry;Intact  Dressing Change Frequency -- Daily  State of Healing -- Early/partial granulation  Site / Wound Assessment -- Dry;Clean  Drainage Amount -- Minimal  Drainage Description -- Serous  Treatment -- Cleansed     No Linked orders to display    Discharge Instructions:   Discharge Instructions     Diet general   Complete by: As directed    Dysphagia 1 diet as tolerated   Discharge instructions   Complete by: As directed    Discharge wound care:   Complete by: As directed    Increase activity slowly   Complete by: As directed        Follow ups:    Follow-up [facility] Follow up.   Contact information: [place] [address] [phone]                Discharge Exam:   Vitals:   05/08/21 2121 05/09/21 0400 05/09/21 0808 05/09/21 1149  BP: 118/64 (!) 105/51 110/63 116/74  Pulse: 92 77 79 90  Resp: 18 17 16 16   Temp: 99.8 F (37.7 C) 97.9 F (36.6 C) 97.9 F (36.6  C) 98.6 F (37 C)  TempSrc:      SpO2: 95% 98% 100% 96%  Weight:      Height:        Body mass index is 18.79 kg/m.  General exam: Pleasant, elderly African-American female.  Lying on bed.  Not in physical distress Did not do detailed examination because of comfort care status.  Time coordinating discharge: 35 minutes   The results of significant diagnostics from this hospitalization (including imaging, microbiology, ancillary and laboratory) are listed below for reference.    Procedures and Diagnostic Studies:   CT ABDOMEN PELVIS WO CONTRAST  Result Date: 05/02/2021 CLINICAL DATA:  Acute left lower quadrant abdominal pain. Large sacral decubitus ulcer. EXAM: CT ABDOMEN AND PELVIS WITHOUT CONTRAST TECHNIQUE: Multidetector CT imaging of the abdomen and pelvis was performed following the standard protocol without IV contrast. RADIATION DOSE REDUCTION: This exam was performed according to the departmental dose-optimization program which includes automated exposure control, adjustment of the mA and/or kV according to patient size and/or use of iterative reconstruction technique. COMPARISON:  04/02/2020 FINDINGS: Lower chest: Minimal left lower lobe atelectasis. Normal sized heart. Coronary artery calcifications. Hepatobiliary: Distended gallbladder with probable dependent sludge. Small liver with no focal abnormality seen. Pancreas: Unremarkable. No pancreatic ductal dilatation or surrounding inflammatory changes. Spleen: Small calcified granulomata/internal arterial calcifications. Normal in size and shape. Adrenals/Urinary Tract: Unremarkable adrenal glands. Bilateral intrarenal arterial calcifications with no definite calculi seen. Unremarkable ureters. Foley catheter in the urinary bladder with a small amount of associated air in the bladder. No significant urine in the bladder. Stomach/Bowel: Small hiatal hernia. Large amount of stool mixed with contrast in the rectum, causing rectal  distension without wall thickening. There is also some stool extending through the anus and onto the table. The remainder of the colon is unremarkable as are the small bowel and appendix. Vascular/Lymphatic: Extensive atheromatous arterial calcifications without aneurysm. No enlarged lymph nodes. Reproductive: Uterus and bilateral adnexa are unremarkable. Other: No abdominal wall hernia or abnormality. No abdominopelvic ascites. Bilateral subcutaneous edema. Musculoskeletal: Lumbar and lower thoracic spine degenerative changes with stable associated subluxations. No fractures. Poorly defined sacral decubitus ulcer extending close to the underlying sacrococcygeal area without bone destruction or periosteal reaction. No soft tissue gas. IMPRESSION: 1. No acute abnormality. Specifically, no explanation for the patient's left lower quadrant abdomen pain. 2. Extensive atheromatous arterial calcifications, including the coronary arteries. 3. Small hiatal hernia. 4. Large amount of stool distending the rectum with evidence of fecal incontinence. Electronically Signed   By: Claudie Revering M.D.   On: 05/02/2021 14:29   DG Chest 2 View  Result Date: 05/02/2021 CLINICAL DATA:  Deep white cough EXAM: CHEST - 2 VIEW COMPARISON:  Chest radiograph 11/15/2019 FINDINGS: The cardiomediastinal silhouette is normal. There is no focal consolidation or pulmonary edema. There is no pleural effusion or pneumothorax. There is no acute osseous abnormality. IMPRESSION: No radiographic evidence of acute cardiopulmonary process. Electronically Signed   By: Valetta Mole M.D.   On: 05/02/2021 12:42   US Venous Img Lower Bilateral (DVT)  Result Date: 05/02/2021 CLINICAL DATA:  Pain and swelling, shortness of breath EXAM: Bilateral LOWER EXTREMITY VENOUS DOPPLER ULTRASOUND TECHNIQUE: Gray-scale sonography with compression, as well as color and duplex ultrasound, were performed to evaluate the deep venous system(s) from the level of the common  femoral vein through the popliteal and proximal calf veins. COMPARISON:  None. FINDINGS: VENOUS Normal compressibility of the common femoral, superficial femoral, and popliteal veins, as well as the visualized calf veins. Right peroneal and posterior tibial veins are not adequately visualized for evaluation. Visualized portions of profunda femoral vein and great saphenous vein unremarkable. No filling defects to suggest DVT on grayscale or color Doppler imaging. Doppler waveforms show normal direction of venous flow, normal respiratory plasticity and response to augmentation. Limited views of the contralateral common femoral vein are unremarkable. OTHER There is diffuse edema in subcutaneous plane without loculated fluid collections. Limitations: Diffuse edema in the subcutaneous plane and patient's inability to cooperate optimally for positioning limits examination. IMPRESSION: There is no evidence of deep venous thrombosis in the visualized major deep veins in both lower extremities. Right peroneal and right posterior tibial veins are not adequately visualized for evaluation. If there are continued symptoms, short-term follow-up venous Doppler examination may be considered. Electronically Signed   By: Elmer Picker M.D.   On: 05/02/2021 19:07   DG Foot Complete Left  Result Date: 05/02/2021 CLINICAL DATA:  Necrotic eschar on heel. Please evaluate for osteo- EXAM: LEFT FOOT - COMPLETE 3+ VIEW COMPARISON:  03/20/2021 FINDINGS: Soft tissue wound posterior to the calcaneus. There is cortical erosion of the posterior calcaneus which has developed since the prior study. This is consistent with osteomyelitis. Spurring of the calcaneus on the plantar surface and at the Achilles tendon insertion. Soft tissue calcification in the region of the Achilles tendon unchanged. Negative for acute fracture. Small metal needle foreign body in the soft tissues between the first and second toes unchanged from the prior study.  Soft tissue swelling of the foot. IMPRESSION: Osteomyelitis of the posterior calcaneus. Electronically Signed   By: Franchot Gallo M.D.   On: 05/02/2021 12:42  Labs:   Basic Metabolic Panel: Recent Labs  Lab 05/04/21 1505 05/05/21 0554 05/06/21 0452 05/07/21 0509 05/08/21 0503  NA 145 144 143 145 147*  K 4.3 4.2 4.1 3.9 3.8  CL 115* 115* 110 106 109  CO2 18* 19* 23 29 26   GLUCOSE 177* 157* 159* 140* 115*  BUN 77* 116* 97* 135* 127*  CREATININE 2.10* 2.19* 2.21* 2.55* 2.91*  CALCIUM 9.9 9.5 8.8* 8.7* 8.4*  PHOS 3.0 2.5 2.4* 2.6 2.9   GFR Estimated Creatinine Clearance: 14.6 mL/min (A) (by C-G formula based on SCr of 2.91 mg/dL (H)). Liver Function Tests: Recent Labs  Lab 05/04/21 1505 05/05/21 0554 05/06/21 0452 05/07/21 0509 05/08/21 0503  ALBUMIN 2.3* 2.1* 2.0* 1.8* 1.7*   No results for input(s): LIPASE, AMYLASE in the last 168 hours. No results for input(s): AMMONIA in the last 168 hours. Coagulation profile No results for input(s): INR, PROTIME in the last 168 hours.  CBC: Recent Labs  Lab 05/02/21 1347  WBC 12.2*  NEUTROABS 9.2*  HGB 10.1*  HCT 32.6*  MCV 93.7  PLT 277   Cardiac Enzymes: No results for input(s): CKTOTAL, CKMB, CKMBINDEX, TROPONINI in the last 168 hours. BNP: Invalid input(s): POCBNP CBG: Recent Labs  Lab 05/07/21 2025 05/08/21 0810 05/08/21 1222 05/08/21 1740 05/08/21 2128  GLUCAP 115* 108* 110* 93 94   D-Dimer No results for input(s): DDIMER in the last 72 hours. Hgb A1c No results for input(s): HGBA1C in the last 72 hours. Lipid Profile No results for input(s): CHOL, HDL, LDLCALC, TRIG, CHOLHDL, LDLDIRECT in the last 72 hours. Thyroid function studies No results for input(s): TSH, T4TOTAL, T3FREE, THYROIDAB in the last 72 hours.  Invalid input(s): FREET3 Anemia work up No results for input(s): VITAMINB12, FOLATE, FERRITIN, TIBC, IRON, RETICCTPCT in the last 72 hours. Microbiology Recent Results (from the past  240 hour(s))  Urine Culture     Status: None   Collection Time: 05/02/21 11:30 AM   Specimen: Urine, Catheterized  Result Value Ref Range Status   Specimen Description   Final    URINE, CATHETERIZED Performed at [facility], [address] [place], [place] [address]    Special Requests   Final    NONE Performed at [facility], [address], [place],  [address]    Culture   Final    NO GROWTH Performed at [facility], [place] [address], [place],  [address]    Report Status 05/04/2021 FINAL  Final  Resp Panel by RT-PCR (Flu A&B, Covid) Nasopharyngeal Swab     Status: Abnormal   Collection Time: 05/02/21  5:54 PM   Specimen: Nasopharyngeal Swab; Nasopharyngeal(NP) swabs in vial transport medium  Result Value Ref Range Status   SARS Coronavirus 2 by RT PCR POSITIVE (A) NEGATIVE Final    Comment: (NOTE) SARS-CoV-2 target nucleic acids are DETECTED.  The SARS-CoV-2 RNA is generally detectable in upper respiratory specimens during the acute phase of infection. Positive results are indicative of the presence of the identified virus, but do not rule out bacterial infection or co-infection with other pathogens not detected by the test. Clinical correlation with patient history and other diagnostic information is necessary to determine patient infection status. The expected result is Negative.  Fact Sheet for Patients: EntrepreneurPulse.com.au  Fact Sheet for Healthcare Providers: IncredibleEmployment.be  This test is not yet approved or cleared by the [place] FDA and  has been authorized for detection and/or diagnosis of SARS-CoV-2 by FDA under  an Emergency Use Authorization (EUA).  This EUA will remain in effect (meaning this test can be used) for the duration of  the COVID-19 declaration under Section 564(b)(1) of the A ct, 21 U.S.C. section 360bbb-3(b)(1), unless the authorization is terminated  or revoked sooner.     Influenza A by PCR NEGATIVE NEGATIVE Final   Influenza B by PCR NEGATIVE NEGATIVE Final    Comment: (NOTE) The Xpert Xpress SARS-CoV-2/FLU/RSV plus assay is intended as an aid in the diagnosis of influenza from Nasopharyngeal swab specimens and should not be used as a sole basis for treatment. Nasal washings and aspirates are unacceptable for Xpert Xpress SARS-CoV-2/FLU/RSV testing.  Fact Sheet for Patients: EntrepreneurPulse.com.au  Fact Sheet for Healthcare Providers: IncredibleEmployment.be  This test is not yet approved or cleared by the [place] FDA and has been authorized for detection and/or diagnosis of SARS-CoV-2 by FDA under an Emergency Use Authorization (EUA). This EUA will remain in effect (meaning this test can be used) for the duration of the COVID-19 declaration under Section 564(b)(1) of the Act, 21 U.S.C. section 360bbb-3(b)(1), unless the authorization is terminated or revoked.  Performed at [facility], [address] [place], [place] [address]      Signed: Terrilee Croak  [facility] 05/09/2021, 12:46 PM

## 2021-05-09 NOTE — Progress Notes (Signed)
SLP Cancellation Note  Patient Details Name: ELIZIBETH BREAU MRN: [id number] DOB: 05/28/1945   Cancelled treatment:       Reason Eval/Treat Not Completed:  (chart reviewed; consulted NSG/MD). Per MD note, "this admission, patient was noted to have ongoing osteomyelitis and hence amputation was recommended.  Patient declined amputation.  She was also found to be COVID-positive. Palliative care consultation was obtained.  Patient ultimately made a choice to be Hospice status. Currently, arrangements being made to discharge home with Hospice services.". Pt is Discharging home w/ Hospice services tomorrow, per MD/NSG and chart notes. Handouts re: diet consistency and aspiration precautions provided and left in room for pt/family for discharge. Pt is currently tolerating her recommended diet of Pureed foods w/ thin liquids following general aspiration precautions. Recommend continue current diet at discharge w/ Pills in Puree for safer swallowing. Pt and family can f/u w/ Hospice Nurse and/or PCP for any further needs post D/C home. NSG updated and agreed.       Orinda Kenner, MS, CCC-SLP Speech Language Pathologist Rehab Services; [place] [phone] (ascom) Siya Flurry 05/09/2021, 4:56 PM

## 2021-05-09 NOTE — Progress Notes (Signed)
[facility]) Hospital Liaison Note  Notified that plan is for patient to discharge home on 2.2.23. Hospital liaison will continue to follow through discharge disposition.   Please send signed and completed DNR home with patient/family. Please provide prescriptions at discharge as needed to ensure ongoing symptom management.   Please do not hesitate to call with any hospice related questions or concerns.   Thank you,  Nadene Rubins, RN, BSN [facility] Liaison [phone]

## 2021-05-10 LAB — GLUCOSE, CAPILLARY: Glucose-Capillary: 101 mg/dL — ABNORMAL HIGH (ref 70–99)

## 2021-05-10 MED ORDER — MORPHINE SULFATE 20 MG/5ML PO SOLN: 2.5000 mg | ORAL | 0 refills | Status: DC | PRN

## 2021-05-10 MED ORDER — TRAMADOL HCL 50 MG PO TABS: 50.0000 mg | ORAL_TABLET | Freq: Four times a day (QID) | ORAL | 0 refills | Status: AC | PRN

## 2021-05-10 NOTE — TOC Progression Note (Signed)
Transition of Care [facility]) - Progression Note    Patient Details  Name: [name] [name] MRN: [id number] Date of Birth: [birth year]  Transition of Care [facility]) CM/SW [place], RN Phone Number: 05/10/2021, 9:44 AM  Clinical Narrative:   Called EMS to schedule transport to Home, Notified the Son and Hospice         Expected Discharge Plan and Services           Expected Discharge Date: 05/09/21                                     Social Determinants of Health (SDOH) Interventions    Readmission Risk Interventions Readmission Risk Prevention Plan 06/01/2020 05/18/2020 11/19/2019  Transportation Screening Complete Complete Complete  PCP or Specialist Appt within 3-5 Days - Complete Complete  HRI or [place] - Complete Complete  Social Work Consult for [place] Planning/Counseling - Complete Complete  Palliative Care Screening - Not Applicable Complete  Medication Review Press photographer) Complete Complete Complete  PCP or Specialist appointment within 3-5 days of discharge Complete - -  SW Recovery Care/Counseling Consult Complete - -  [place] Not Applicable - -  Some recent data might be hidden

## 2021-05-10 NOTE — Progress Notes (Signed)
Patient was planned for discharge yesterday to home with hospice services.  However son did not have enough arrangements and wanted to hold off till this morning. Called and discussed with son this morning.  Pain medicines sent to preferred pharmacy. Okay to discharge today [facility] will not bill for today

## 2021-05-10 NOTE — TOC Progression Note (Signed)
Transition of Care [facility]) - Progression Note    Patient Details  Name: [name] [name] MRN: [id number] Date of Birth: [birth year]  Transition of Care [facility]) CM/SW [place], RN Phone Number: 05/10/2021, 8:58 AM  Clinical Narrative:    Spoke with the son and he stated that everything is set and ready for the patient to come home today, she will need EMS to transport, She will be followed by [facility]        Expected Discharge Plan and Services           Expected Discharge Date: 05/09/21                                     Social Determinants of Health (SDOH) Interventions    Readmission Risk Interventions Readmission Risk Prevention Plan 06/01/2020 05/18/2020 11/19/2019  Transportation Screening Complete Complete Complete  PCP or Specialist Appt within 3-5 Days - Complete Complete  HRI or [place] - Complete Complete  Social Work Consult for [place] Planning/Counseling - Complete Complete  Palliative Care Screening - Not Applicable Complete  Medication Review Press photographer) Complete Complete Complete  PCP or Specialist appointment within 3-5 days of discharge Complete - -  SW Recovery Care/Counseling Consult Complete - -  [place] Not Applicable - -  Some recent data might be hidden

## 2021-05-10 NOTE — Progress Notes (Signed)
Pts son called and given discharge instructions. EMS here to transport pt, son notified.

## 2021-05-11 ENCOUNTER — Other Ambulatory Visit: Payer: Self-pay | Admitting: Internal Medicine

## 2021-05-11 MED ORDER — MORPHINE SULFATE 20 MG/5ML PO SOLN: 2.5000 mg | ORAL | 0 refills | Status: AC | PRN

## 2021-05-14 NOTE — Telephone Encounter (Signed)
error 

## 2021-11-15 IMAGING — DX DG FOOT 2V*R*
2 series · 2 of 2 positions shown · non-contrast
Comparison: 05/17/2020

CLINICAL DATA: Postop check right foot

EXAM:
RIGHT FOOT - 2 VIEW

[foot ap]
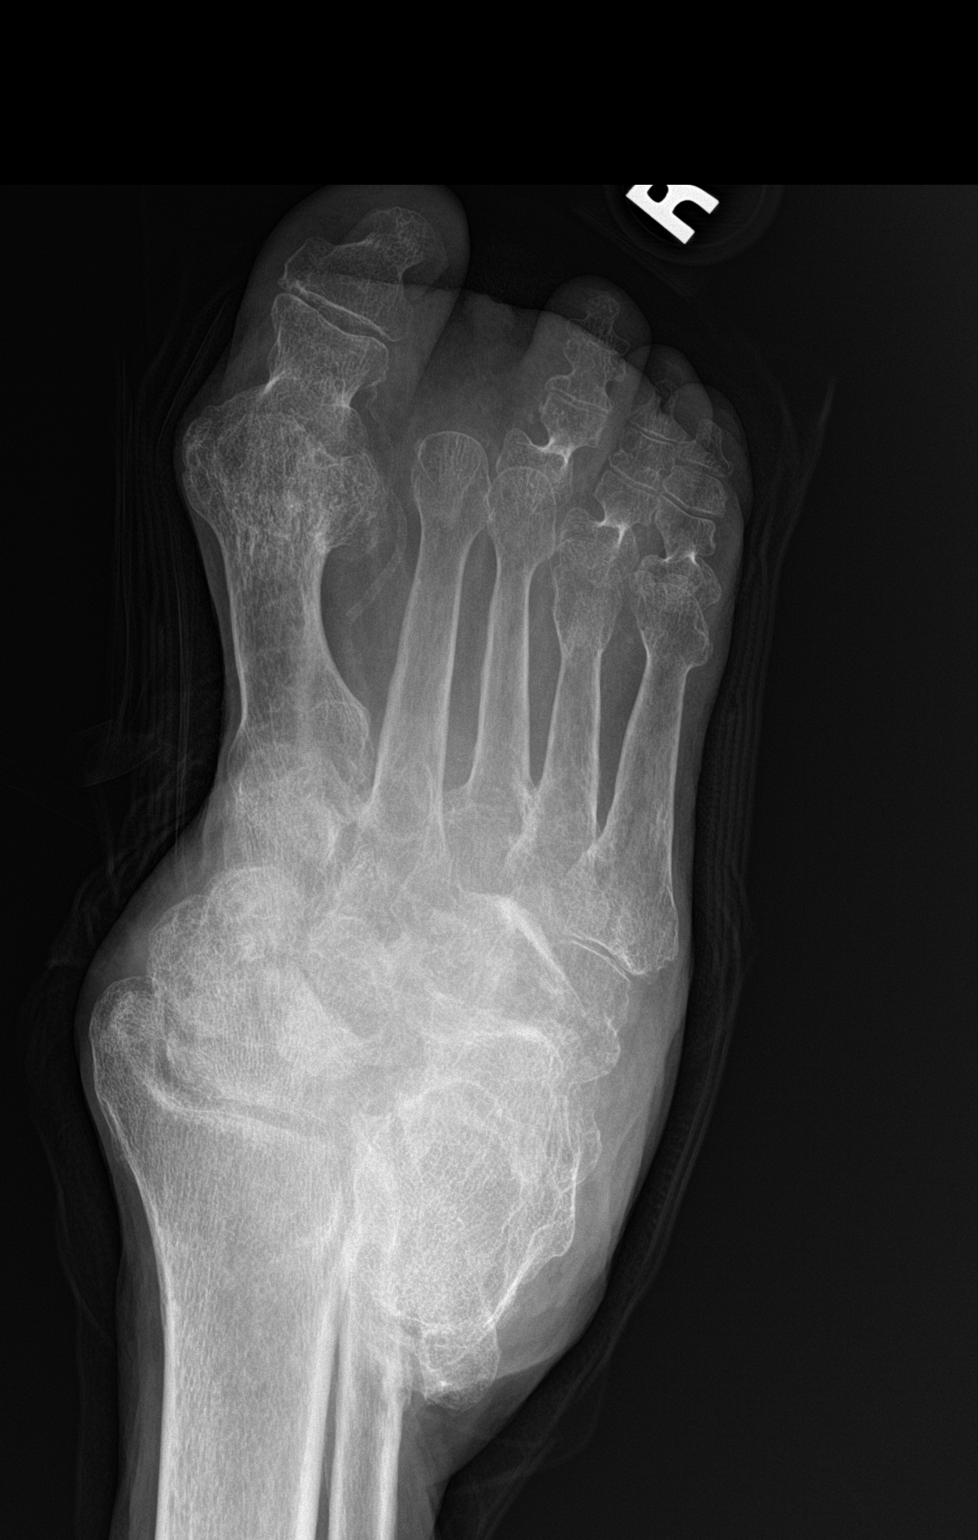

[foot lat]
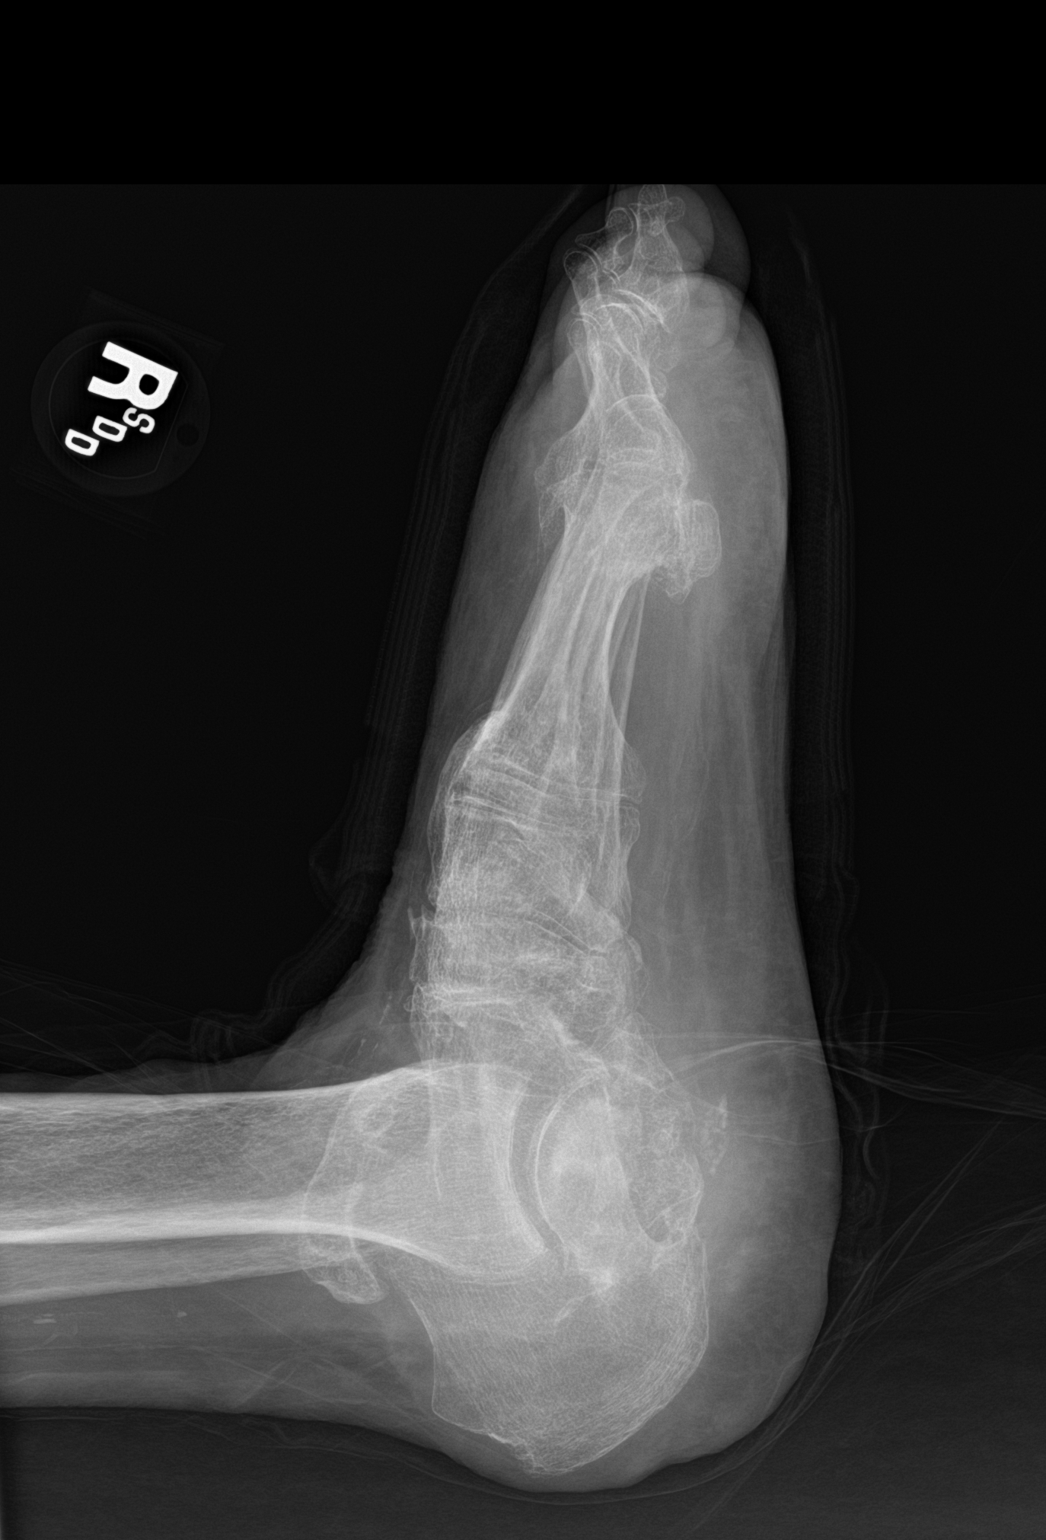

[2 of 2 positions shown; findings below may reference images not displayed]

FINDINGS: The patient is status post amputation of the second digit at the MTP
joint. No osseous fracture is seen. Overlying soft tissue swelling
seen at the wound. Again noted is fragmentation and advanced
arthropathy seen within the midfoot and hindfoot with hindfoot
collapse. There is a hindfoot varus deformity with pes planus. First
MTP joint arthropathy with ankylosis is noted. There is dorsal soft
tissue swelling.
IMPRESSION: Status post second digit amputation at the MTP joint. No definite
acute complication.

Again noted are findings of the midfoot and hindfoot Charcot
arthropathy with midfoot collapse.
